# Patient Record
Sex: Female | Born: 1935 | Race: White | Hispanic: No | Smoking: Former smoker
Health system: Southern US, Community
[De-identification: ages and names within clinical notes are randomized; demographics above are authoritative.]

## PROBLEM LIST (undated history)

## (undated) DIAGNOSIS — K648 Other hemorrhoids: Secondary | ICD-10-CM

## (undated) DIAGNOSIS — F419 Anxiety disorder, unspecified: Secondary | ICD-10-CM

## (undated) DIAGNOSIS — D509 Iron deficiency anemia, unspecified: Secondary | ICD-10-CM

## (undated) DIAGNOSIS — I340 Nonrheumatic mitral (valve) insufficiency: Secondary | ICD-10-CM

## (undated) DIAGNOSIS — I509 Heart failure, unspecified: Secondary | ICD-10-CM

## (undated) DIAGNOSIS — C801 Malignant (primary) neoplasm, unspecified: Secondary | ICD-10-CM

## (undated) DIAGNOSIS — M8580 Other specified disorders of bone density and structure, unspecified site: Secondary | ICD-10-CM

## (undated) DIAGNOSIS — Z8669 Personal history of other diseases of the nervous system and sense organs: Secondary | ICD-10-CM

## (undated) DIAGNOSIS — H9190 Unspecified hearing loss, unspecified ear: Secondary | ICD-10-CM

## (undated) DIAGNOSIS — D649 Anemia, unspecified: Secondary | ICD-10-CM

## (undated) DIAGNOSIS — K219 Gastro-esophageal reflux disease without esophagitis: Secondary | ICD-10-CM

## (undated) DIAGNOSIS — I499 Cardiac arrhythmia, unspecified: Secondary | ICD-10-CM

## (undated) DIAGNOSIS — I10 Essential (primary) hypertension: Secondary | ICD-10-CM

## (undated) DIAGNOSIS — K5790 Diverticulosis of intestine, part unspecified, without perforation or abscess without bleeding: Secondary | ICD-10-CM

## (undated) DIAGNOSIS — E7800 Pure hypercholesterolemia, unspecified: Secondary | ICD-10-CM

## (undated) DIAGNOSIS — K5909 Other constipation: Secondary | ICD-10-CM

## (undated) DIAGNOSIS — C189 Malignant neoplasm of colon, unspecified: Secondary | ICD-10-CM

## (undated) DIAGNOSIS — Z974 Presence of external hearing-aid: Secondary | ICD-10-CM

## (undated) DIAGNOSIS — H9192 Unspecified hearing loss, left ear: Secondary | ICD-10-CM

## (undated) HISTORY — PX: FRACTURE SURGERY: SHX138

## (undated) HISTORY — DX: Heart failure, unspecified: I50.9

## (undated) HISTORY — DX: Malignant neoplasm of colon, unspecified: C18.9

## (undated) HISTORY — PX: EYE SURGERY: SHX253

## (undated) HISTORY — DX: Cardiac arrhythmia, unspecified: I49.9

## (undated) HISTORY — PX: FEMUR FRACTURE SURGERY: SHX633

## (undated) HISTORY — PX: OTHER SURGICAL HISTORY: SHX169

---

## 2004-02-29 ENCOUNTER — Ambulatory Visit: Payer: Self-pay

## 2004-10-23 ENCOUNTER — Ambulatory Visit: Payer: Self-pay | Admitting: Internal Medicine

## 2005-11-01 ENCOUNTER — Ambulatory Visit: Payer: Self-pay | Admitting: Internal Medicine

## 2006-11-04 ENCOUNTER — Ambulatory Visit: Payer: Self-pay | Admitting: Internal Medicine

## 2007-03-11 ENCOUNTER — Ambulatory Visit: Payer: Self-pay | Admitting: Internal Medicine

## 2007-11-06 ENCOUNTER — Ambulatory Visit: Payer: Self-pay | Admitting: Internal Medicine

## 2008-05-12 ENCOUNTER — Ambulatory Visit: Payer: Self-pay | Admitting: Internal Medicine

## 2008-11-11 ENCOUNTER — Ambulatory Visit: Payer: Self-pay | Admitting: Internal Medicine

## 2009-03-24 ENCOUNTER — Ambulatory Visit: Payer: Self-pay | Admitting: Gastroenterology

## 2009-11-22 ENCOUNTER — Ambulatory Visit: Payer: Self-pay | Admitting: Internal Medicine

## 2010-12-26 ENCOUNTER — Ambulatory Visit: Payer: Self-pay | Admitting: Family Medicine

## 2012-05-31 ENCOUNTER — Ambulatory Visit: Payer: Self-pay | Admitting: Orthopedic Surgery

## 2012-05-31 LAB — COMPREHENSIVE METABOLIC PANEL
Albumin: 4 g/dL (ref 3.4–5.0)
Alkaline Phosphatase: 52 U/L (ref 50–136)
Bilirubin,Total: 0.4 mg/dL (ref 0.2–1.0)
Calcium, Total: 9.5 mg/dL (ref 8.5–10.1)
Creatinine: 0.87 mg/dL (ref 0.60–1.30)
Glucose: 143 mg/dL — ABNORMAL HIGH (ref 65–99)
Potassium: 3.1 mmol/L — ABNORMAL LOW (ref 3.5–5.1)
SGOT(AST): 24 U/L (ref 15–37)
SGPT (ALT): 26 U/L (ref 12–78)
Sodium: 136 mmol/L (ref 136–145)

## 2012-05-31 LAB — URINALYSIS, COMPLETE
Bilirubin,UR: NEGATIVE
Blood: NEGATIVE
Leukocyte Esterase: NEGATIVE
Nitrite: NEGATIVE
Ph: 7 (ref 4.5–8.0)
Protein: NEGATIVE
Specific Gravity: 1.011 (ref 1.003–1.030)
Squamous Epithelial: NONE SEEN
WBC UR: 1 /HPF (ref 0–5)

## 2012-05-31 LAB — CBC WITH DIFFERENTIAL/PLATELET
Lymphocyte #: 2.8 10*3/uL (ref 1.0–3.6)
MCH: 31.1 pg (ref 26.0–34.0)
MCHC: 34.4 g/dL (ref 32.0–36.0)
MCV: 90 fL (ref 80–100)
Monocyte #: 1.4 x10 3/mm — ABNORMAL HIGH (ref 0.2–0.9)
Monocyte %: 13.3 %
Neutrophil #: 6.2 10*3/uL (ref 1.4–6.5)
Neutrophil %: 57.8 %
Platelet: 211 10*3/uL (ref 150–440)
RBC: 4.88 10*6/uL (ref 3.80–5.20)
RDW: 12.9 % (ref 11.5–14.5)

## 2012-06-01 ENCOUNTER — Inpatient Hospital Stay: Payer: Self-pay | Admitting: Specialist

## 2012-06-01 LAB — CBC WITH DIFFERENTIAL/PLATELET
Eosinophil #: 0 10*3/uL (ref 0.0–0.7)
HGB: 12.9 g/dL (ref 12.0–16.0)
Lymphocyte #: 0.9 10*3/uL — ABNORMAL LOW (ref 1.0–3.6)
Lymphocyte %: 5.5 %
MCH: 31.1 pg (ref 26.0–34.0)
MCHC: 34.1 g/dL (ref 32.0–36.0)
MCV: 91 fL (ref 80–100)
Monocyte #: 0.8 x10 3/mm (ref 0.2–0.9)
Monocyte %: 5.1 %
Neutrophil #: 14.3 10*3/uL — ABNORMAL HIGH (ref 1.4–6.5)
Neutrophil %: 89.1 %
RBC: 4.16 10*6/uL (ref 3.80–5.20)
RDW: 12.7 % (ref 11.5–14.5)
WBC: 16.1 10*3/uL — ABNORMAL HIGH (ref 3.6–11.0)

## 2012-06-01 LAB — BASIC METABOLIC PANEL
BUN: 16 mg/dL (ref 7–18)
Co2: 26 mmol/L (ref 21–32)
Creatinine: 0.68 mg/dL (ref 0.60–1.30)
Glucose: 170 mg/dL — ABNORMAL HIGH (ref 65–99)
Osmolality: 279 (ref 275–301)
Sodium: 137 mmol/L (ref 136–145)

## 2012-06-02 LAB — CBC WITH DIFFERENTIAL/PLATELET
Basophil #: 0 10*3/uL (ref 0.0–0.1)
Basophil %: 0.3 %
Eosinophil #: 0 10*3/uL (ref 0.0–0.7)
HCT: 30.4 % — ABNORMAL LOW (ref 35.0–47.0)
HGB: 10.4 g/dL — ABNORMAL LOW (ref 12.0–16.0)
Lymphocyte #: 1.8 10*3/uL (ref 1.0–3.6)
MCV: 92 fL (ref 80–100)
Monocyte #: 1.7 x10 3/mm — ABNORMAL HIGH (ref 0.2–0.9)
Neutrophil #: 8.4 10*3/uL — ABNORMAL HIGH (ref 1.4–6.5)
Neutrophil %: 70.5 %
Platelet: 145 10*3/uL — ABNORMAL LOW (ref 150–440)
WBC: 11.9 10*3/uL — ABNORMAL HIGH (ref 3.6–11.0)

## 2012-06-02 LAB — BASIC METABOLIC PANEL
BUN: 14 mg/dL (ref 7–18)
Calcium, Total: 7.7 mg/dL — ABNORMAL LOW (ref 8.5–10.1)
Chloride: 108 mmol/L — ABNORMAL HIGH (ref 98–107)
Creatinine: 0.56 mg/dL — ABNORMAL LOW (ref 0.60–1.30)
EGFR (African American): >60
EGFR (Non-African Amer.): >60
Osmolality: 277 (ref 275–301)
Potassium: 3.7 mmol/L (ref 3.5–5.1)

## 2012-06-04 ENCOUNTER — Encounter: Payer: Self-pay | Admitting: Internal Medicine

## 2012-06-04 LAB — CBC WITH DIFFERENTIAL/PLATELET
Basophil #: 0.1 10*3/uL (ref 0.0–0.1)
HGB: 10 g/dL — ABNORMAL LOW (ref 12.0–16.0)
Lymphocyte #: 2 10*3/uL (ref 1.0–3.6)
MCH: 31.3 pg (ref 26.0–34.0)
Monocyte #: 1.9 x10 3/mm — ABNORMAL HIGH (ref 0.2–0.9)
Neutrophil #: 7.3 10*3/uL — ABNORMAL HIGH (ref 1.4–6.5)
Neutrophil %: 63 %
Platelet: 170 10*3/uL (ref 150–440)
RBC: 3.19 10*6/uL — ABNORMAL LOW (ref 3.80–5.20)
RDW: 12.4 % (ref 11.5–14.5)

## 2012-06-04 LAB — BASIC METABOLIC PANEL
BUN: 14 mg/dL (ref 7–18)
Calcium, Total: 8.2 mg/dL — ABNORMAL LOW (ref 8.5–10.1)
Chloride: 103 mmol/L (ref 98–107)
Co2: 28 mmol/L (ref 21–32)
EGFR (African American): >60
EGFR (Non-African Amer.): >60
Osmolality: 275 (ref 275–301)
Sodium: 137 mmol/L (ref 136–145)

## 2012-06-29 ENCOUNTER — Encounter: Payer: Self-pay | Admitting: Internal Medicine

## 2012-07-08 LAB — BASIC METABOLIC PANEL
Anion Gap: 4 — ABNORMAL LOW (ref 7–16)
Co2: 31 mmol/L (ref 21–32)
Creatinine: 0.73 mg/dL (ref 0.60–1.30)
EGFR (Non-African Amer.): >60
Glucose: 74 mg/dL (ref 65–99)
Osmolality: 274 (ref 275–301)
Potassium: 4.1 mmol/L (ref 3.5–5.1)

## 2013-07-27 DIAGNOSIS — K5909 Other constipation: Secondary | ICD-10-CM | POA: Insufficient documentation

## 2013-07-27 DIAGNOSIS — M8580 Other specified disorders of bone density and structure, unspecified site: Secondary | ICD-10-CM | POA: Insufficient documentation

## 2014-05-21 NOTE — Discharge Summary (Signed)
PATIENT NAME:  [name] [name], [name] [name] MR#:  [id number] DATE OF BIRTH:  09-Mar-1929  DATE OF ADMISSION:  06/01/2012 DATE OF DISCHARGE:  06/04/2012   FINAL DIAGNOSES:  1.  Displaced intertrochanteric fracture of right hip.  2.  Hypertension.  3.  Hyperlipidemia.   OPERATION: On 06/01/2012, ORIF of right hip fracture with a long gamma nail by Dr. Durene Cal.   COMPLICATIONS: None.   CONSULTATIONS [facility].   DISCHARGE AND HOME MEDICATIONS: Verapamil 180 mg daily, Retin-A as needed, lovastatin 40 mg daily, hydrochlorothiazide 25 mg daily, enteric-coated aspirin 1 p.o. b.i.d., calcium with vitamin D twice a day, Norco 5/325 p.r.n. pain.   HISTORY OF PRESENT ILLNESS: The patient is an 81 year old female who was walking up stairs and missed a step and fell, injuring her right hip. She was brought to the Emergency Room at [facility] where exam and x-rays revealed a displaced intertrochanteric fracture of the right hip. She was admitted for surgery and medical evaluation. She was admitted to Durene Cal.   PAST MEDICAL HISTORY:  ILLNESSES: As above.   ALLERGIES: SULFA AND CIPRO.   HOME MEDICATIONS: As above.   SOCIAL HISTORY: Unremarkable. The patient is married and lives with her husband.   FAMILY HISTORY: Mother died from colon cancer. Father had ASHD.   REVIEW OF SYSTEMS: Unremarkable.   PHYSICAL EXAMINATION: The patient was alert and cooperative. The right leg was painful with movement and was shortened and rotated. Neurovascular status was good distally. The skin was intact. No other orthopedic injuries were noted.   LABORATORY DATA: On admission was satisfactory except for a potassium of 3.1. She received replacement for this.   HOSPITAL COURSE: The patient was cleared for surgery and underwent long gamma nail rodding of the right hip on 06/01/2012. Postoperatively, she did quite well. Hemoglobin remained stable and she started physical therapy without problems. The patient is stable and  ready for skilled nursing discharge on 06/04/2012. She is partial weight-bearing on a walker with good rehabilitation potential. She will return to my office in 2 weeks for exam and x-rays of the hip.   ____________________________ Park Breed, MD hem:jm D: 06/04/2012 13:59:41 ET T: 06/04/2012 14:21:14 ET JOB#: [id number]  cc: Park Breed, MD, <Dictator> [name], MD Park Breed MD ELECTRONICALLY SIGNED 06/11/2012 10:00

## 2014-05-21 NOTE — Consult Note (Signed)
PATIENT NAME:  [name] [name], FULTS MR#:  [id number] DATE OF BIRTH:  11/13/1929  DATE OF CONSULTATION:  06/01/2012  REFERRING PHYSICIAN:  Dr. Durene Cal CONSULTING PHYSICIAN:  Monica Becton, MD  PRIMARY CARE PHYSICIAN:  Dr. Netty Starring.  REASON FOR CONSULTATION:  Preoperative clearance and medical management.  HISTORY OF PRESENT ILLNESS:  The patient is an 81 year old pleasant female with a past medical history of hypertension, hyperlipidemia, was walking up the stairs and missed a step and fell down and started to experience pain in the right hip.  The patient could not stand up.  Concerning this, EMS was called and was brought to the Emergency Department.  Work-up in the Emergency Department showed the patient sustained right intratrochanteric fracture.  The patient has been admitted under orthopedic surgery.  Lab work-up unremarkable except for the potassium of 3.1.  The patient at baseline is well-functional, walks about 1 mile 3 to 4 times a week.  Independent of ADLs and IADLs.  No previous episodes of chest pain.  No prior cardiac work-up for the heart.  Denies having any chest pain, palpitations.  No shortness of breath with exertion.   PAST MEDICAL HISTORY: 1.  Hypertension.  2.  Hyperlipidemia.   ALLERGIES:  SULFA DRUGS AND CIPRO.   HOME MEDICATIONS: 1.  Verapamil 180 mg once a day.  2.  Retin A as needed. 3.  Lovastatin 40 mg once a day.  4.  Hydrochlorothiazide 25 mg once a day.  5.  Aspirin 81 mg once a day.   SOCIAL HISTORY:  Smoked in the past, quit in 1986, prior to that smoked 1 pack a day.  Denies drinking alcohol or using any illicit drugs.  Married, lives with her husband.   FAMILY HISTORY:  Mother died from colon cancer.  Father had atherosclerotic disease.   REVIEW OF SYSTEMS: CONSTITUTIONAL:  No generalized weakness, fatigue.  No weight loss.  EYES:  No change in vision, wears reading glasses.  EARS:  No change in hearing or tinnitus.  RESPIRATORY:  No  cough, shortness of breath.  CARDIOVASCULAR:  No chest pain, palpitations, lower extremity swelling.  GASTROINTESTINAL:  Has good appetite, usually is constipated, takes MiraLAX with good improvement.  SKIN:  No rash or lesions.  MSK osteoarthritis.  No joint pains or aches.  ENDOCRINE:  No polyuria or polydipsia.  NEUROLOGIC:  No weakness.  PSYCHIATRIC:  No depression.   PHYSICAL EXAMINATION: GENERAL:  This is well-built, well-nourished, age-appropriate female lying down in the bed, not in distress.  VITAL SIGNS:  Temperature 99, pulse 78, blood pressure 140/64, respiratory rate of 18, oxygen saturation is 97% on room air.  HEENT:  Head normocephalic, atraumatic.  Eyes, no scleral icterus.  Conjunctivae normal.  Pupils equal and react to light.  Extraocular movements are intact.  Mucous membranes moist.  No pharyngeal erythema.  NECK:  Supple.  No lymphadenopathy.  No JVD.  No carotid bruit.  CHEST:  Has no focal tenderness.  LUNGS:  Bilaterally clear to auscultation.  HEART:  S1, S2 regular.  No murmurs are heard.  No pedal edema.  Pulses 2+ in dorsalis pedis.  ABDOMEN:  Bowel sounds plus.  Soft, nontender, nondistended.  No hepatosplenomegaly.  EXTREMITIES:  Right lower extremity externally rotated.  NEUROLOGIC:  The patient is alert, oriented to place, person and time.  Cranial nerves II through XII intact.  I did not check the motor secondary to right hip fracture.  SKIN:  No rash or lesions.   LABORATORY DATA:  CBC, WBC of 10.7, hemoglobin 15.2, platelet count of 211.  Complete metabolic panel is completely within normal limits.  UA negative for nitrites and leukocyte esterase.  X-ray of the hip, comminuted, angulated, displaced, intratrochanteric fracture of the right hip.  X-ray of the pelvis, no pelvic fractures.  Chest x-ray, 1 view, portable, mild hyperinflation which may reflect COPD, an element of pulmonary fibrosis likely present.   EKG 12-lead:  Normal sinus rhythm with no ST-T  wave abnormalities.   ASSESSMENT AND PLAN:  This patient is an 81 year old pleasant white female who had a mechanical fall and sustained a right intratrochanteric hip fracture.  1.  Right intratrochanteric hip fracture.  As per revised cardiac index score patient has 0.4% of perioperative complications.  Keep the patient on incentive spirometer.  Early ambulation after the surgery.  Discontinue the Foley catheter at the earliest.  Keep the patient on deep vein thrombosis prophylaxis per orthopedic surgeon's preference.  2.  Hypertension.  We will hold the hydrochlorothiazide.  Continue with the verapamil.  3.  Hypokalemia.  We will replace by mouth.  4.  Keep the patient on deep vein thrombosis prophylaxis with Lovenox.    Thank you for the consult.  TIME SPENT:  45 minutes.     ____________________________ Monica Becton, MD pv:ea D: 06/01/2012 02:56:27 ET T: 06/01/2012 04:36:20 ET JOB#: [id number]  cc:   Monica Becton, MD, <Dictator> Maebelle Munroe, MD Monica Becton MD ELECTRONICALLY SIGNED 06/02/2012 7:11

## 2014-05-21 NOTE — Consult Note (Signed)
Brief Consult Note: Diagnosis: displaced closed R intertroch fx.   Consult note dictated.   Recommend to proceed with surgery or procedure.   Orders entered.   Discussed with Attending MD.   Comments: plan: CR vs OR, cephomed nail risks/benefits/alternatives d/w pt / husband traction int med eval  full note to follow.  Electronic Signatures: Maebelle Munroe (MD)  (Signed [phone] 23:29)  Authored: Brief Consult Note   Last Updated: 03-May-14 23:29 by Maebelle Munroe (MD)

## 2014-05-21 NOTE — H&P (Signed)
PATIENT NAME:  [name] [name], [name] [name] MR#:  [id number] DATE OF BIRTH:  [birth year]  DATE OF ADMISSION:  06/01/2012  CHIEF COMPLAINT: Right hip pain.   HISTORY OF PRESENT ILLNESS: The patient is an 81 year old female, who sustained a fall today with resultant displaced right hip intertrochanteric fracture for which I was asked to provide definitive consultation. She complains of sharp, intermittent pain over the right lateral hip region. She had no antecedent right hip pain. She has been quite active with her husband and walking and, in fact, walked a mile today. She does have a history of osteoporosis. She is currently on Evista for that.   REVIEW OF SYSTEMS:   NEUROLOGIC: No loss of consciousness.  CARDIAC: No chest pain.  RESPIRATORY: No shortness of breath.  SKIN: No laceration.  GASTROINTESTINAL: No nausea or vomiting.  GENITOURINARY: No frequency or dysuria.  MUSCULOSKELETAL: Complains of right hip pain only.  all other review of systems  PAST MEDICAL HISTORY: Remarkable for hypercholesterolemia, hypertension and basal cell carcinoma of the face and leg.   PAST SURGICAL HISTORY: Includes resection of basal cell carcinoma.   CURRENT MEDICATIONS: Include verapamil, Retin-A, lovastatin, hydrochlorothiazide and  aspirin.   ALLERGIES: SULFA AND CIPRO.   SOCIAL HISTORY: Quit smoking long ago. No alcohol use. No illicit drug use. Currently married and lives with her husband.   FAMILY HISTORY: Positive for colon cancer and atherosclerotic disease.   PHYSICAL EXAMINATION:  GENERAL: Pleasant, alert, elderly female appearing of her stated age.  VITAL SIGNS: On admission temperature of 98.3, pulse 73, respirations of 19, blood pressure of 143/70 and pulse oximetry 94% on room air.  HEENT: Normocephalic, atraumatic. Sclerae clear. Oral mucosal membranes are slightly dry.  SKIN: Warm and intact over the right hip.  LUNGS: Clear to auscultation bilaterally.  HEART: Regular rate and rhythm.  Normal S1, S2. No murmurs or gallops.  ABDOMEN: Soft, nontender, nondistended. Positive bowel sounds.  LYMPHATIC: Mild swelling of the right hip region.  NEUROLOGIC: Motor and light touch sensation examination intact in the right lower extremity.  VASCULAR: 2+ dorsalis pedis and posterior tibialis pulse.  MUSCULOSKELETAL: Bilateral upper extremity examination and left lower extremity examination without deformity or tenderness to palpation. Right lower extremity evaluation shows a shortened and externally rotated right lower extremity with focal tenderness to palpation over the lateral aspect of the right hip. The remainder of the ipsilateral right lower extremity is nontender to palpation and without deformity.   Radiographs reveal a comminuted, displaced, 4-part, right hip intertrochanteric fracture.   LABORATORY EVALUATION: On admission to the hospital, chemistries unremarkable except for slightly increased glucose. Hemoglobin and hematocrit excellent at 15.2 and 44.1 with 211,000 platelets. Urinalysis is unremarkable.   IMPRESSION: Closed displaced right hip intertrochanteric fracture.   PLAN: I have recommended a closed versus open reduction and right hip long cephalomedullary nailing. Risks, benefits and alternatives were discussed with the patient and her husband to include, but not limited to bleeding, infection, damage to blood vessels and nerves, need for further surgery and treatment, chronic pain, loss of function, stiffness, allergy, anesthetic risk, deep vein thrombosis, PE; heart, lung, brain, kidney complications, leg length inequality, malunion, nonunion, need for hardware removal, DVT and PE.  She appeared to understand the risks, benefits and desired to proceed with operative treatment. Hospitalist consultation has deemed her to be medically optimized for surgical intervention tomorrow on 06/01/2012.   ____________________________ Maebelle Munroe, MD jfs:aw D: 06/01/2012 13:05:15  ET T: 06/01/2012 13:21:29 ET JOB#: [id number]  cc:  Maebelle Munroe, MD, <Dictator> Maebelle Munroe MD ELECTRONICALLY SIGNED 06/01/2012 22:49

## 2014-05-21 NOTE — Op Note (Signed)
PATIENT NAME:  [name] [name], [name] [name] MR#:  [id number] DATE OF BIRTH:  1929/10/08  DATE OF PROCEDURE:  06/01/2012  PREOPERATIVE DIAGNOSIS: Closed displaced 4-part right hip intertrochanteric fracture.   POSTOPERATIVE DIAGNOSIS: Closed displaced 4-part right hip intertrochanteric fracture.   PROCEDURE PERFORMED:  Closed reduction with long cephalomedullary nail placement, right hip intertrochanteric fracture.  SURGEON: Durene Cal, MD  ASSISTANT:  None.  COMPLICATIONS:  None apparent.  ANESTHESIA: Spinal with IV sedation.   OPERATIVE FINDINGS: Comminuted although reducible right hip 4-part intertrochanteric fracture.  Leg lengths equal and 2+ dorsalis pedis and posterior tibialis pulse. Equal with appropriate rotation of the right lower extremity post-procedure.   IMPLANTS: Stryker gamma 3 nail, size 11 x 130 degree x 360 mm, 10.5 x 105 mm lag screw, 5.0 mm interlocking screws x 2, set screw x 1.   INDICATIONS: Ms. Dohn is an 81 year old female who was seen fall to yesterday with resultant displaced intertrochanteric fracture requiring operative management. Risks, benefits and alternatives were discussed with her and her husband to include but not limited to bleeding, infection, damage to blood vessels and nerves with need for further surgery and treatment, chronic pain, loss of function, stiffness, allergy, anesthetic risk, DVT, PE, heart, lung, brain kidney complications, malunion, nonunion, need for hardware removal, and leg length inequality.  She appeared to understand the risks and benefits and desired to proceed with operative treatment. Internal medicine consultation was obtained preoperatively, which deemed her to be medically optimized for surgical intervention.   ESTIMATED BLOOD LOSS: 200 mL.   DESCRIPTION OF PROCEDURE: After positive identification of the patient in the preoperative holding area, after informed consent had been obtained, the correct operative site had been  initialed by myself, the patient was taken to the operating room and placed in the supine position. IV antibiotics were given before any skin incision had been made. A timeout was performed. Spinal anesthesia had been administered in the preoperative holding area.  The patient was brought down against the perineal post. Traction and internal rotation were applied to the right lower extremity. The left lower extremity was flexed into well-leg holder. All bony prominences were padded. A bump was placed under the right hip.   Fluoroscopic unit was brought in confirming a near anatomic reduction.  The right hip and thigh were prepped and draped in the usual sterile fashion.  TED sequentials were applied to the opposite leg. A longitudinal incision was made coursing in line with the femur, proximal to the greater trochanter. Incision was made sharply through skin and subcutaneous tissues. The tensor fascia lata was incised in line with the skin incision.  Blunt dissection was carried out down to the level of the greater trochanter.  The curved awl was placed into an appropriate position under AP and lateral imaging. Guidewire was advanced along with a 1-step reamer. The curved ball-tip guidewire was confirmed in intramedullary position under biplanar fluoroscopy and was advanced to a center-center position in the distal femur confirmed under true lateral fluoroscopy of the knee. Guidewire depth was assessed at 390 mm and I opted to place a 360 mm nail given the 2 cm been in the guidewire. Successive reaming was carried out from 11 to 13 mm in 1 mm increments. The 11 x 130 degree, which was templated preoperatively with fluoroscopy, x 360 mm nail, was then placed to an appropriate depth using a carbon fiber aiming guide. The guidewire sleeve was then brought to the lateral aspect of the femur after a stab incision  had been made over the lateral aspect of the thigh. The guidewire was placed into a slightly low and  posterior position. It was assessed at 101 mm and then hence requiring a 105 mm lag screw.  The guidewire was overdrilled and the lag screw was placed to an appropriate depth under AP and lateral fluoroscopy.  The fracture was compressed, the set screw was then placed, and final fluoroscopic imaging revealed there to be a near anatomic reduction with acceptable position of the hardware and reduction of the fracture. Next, attention was directed distally where a perfect circle technique was performed and two 5.0 mm interlocking screws were placed using standard AO technique.  Intramedullary placement of the guide screw was confirmed under biplanar fluoroscopy as well as appropriate length of the screws.  All wounds were copiously irrigated.  The proximal wound was closed with 0 PDS in a running fashion, in the deep fascia, as well as 0 PDS and 3-0 PDS and 3-0 nylon, and 3-0 nylon in the distal 3 incisions as well. 30 mL of 0.25% plain bupivacaine was injected proximal to the nail insertion site as well as into the deep soft tissues.  The patient was then transferred to the recovery room bed where equal leg lengths, appropriate rotation, and 2+ dorsalis pedis pulse was noted of the right lower extremity. The patient tolerated the procedure well and was taken to the recovery room in satisfactory condition without apparent operative site complications. These findings were relayed to her husband as well as her need for follow up and compliance and in-hospital stay.  ____________________________ Maebelle Munroe, MD jfs:sb D: 06/01/2012 22:28:52 ET T: 06/02/2012 07:24:34 ET JOB#: [id number]  cc: Maebelle Munroe, MD, <Dictator> Maebelle Munroe MD ELECTRONICALLY SIGNED 06/23/2012 19:28

## 2014-08-09 ENCOUNTER — Encounter: Payer: Self-pay | Admitting: *Deleted

## 2014-08-10 ENCOUNTER — Ambulatory Visit
Admission: RE | Admit: 2014-08-10 | Discharge: 2014-08-10 | Disposition: A | Discharge status: Home / Self Care | Payer: Medicare Other | Source: Ambulatory Visit | Attending: Gastroenterology | Admitting: Gastroenterology

## 2014-08-10 ENCOUNTER — Ambulatory Visit: Payer: Medicare Other | Admitting: Anesthesiology

## 2014-08-10 ENCOUNTER — Encounter: Payer: Self-pay | Admitting: Anesthesiology

## 2014-08-10 ENCOUNTER — Encounter
Admission: RE | Disposition: A | Discharge status: Home / Self Care | Payer: Self-pay | Source: Ambulatory Visit | Attending: Gastroenterology

## 2014-08-10 DIAGNOSIS — E780 Pure hypercholesterolemia: Secondary | ICD-10-CM | POA: Insufficient documentation

## 2014-08-10 DIAGNOSIS — K921 Melena: Secondary | ICD-10-CM | POA: Diagnosis not present

## 2014-08-10 DIAGNOSIS — D123 Benign neoplasm of transverse colon: Secondary | ICD-10-CM | POA: Insufficient documentation

## 2014-08-10 DIAGNOSIS — F419 Anxiety disorder, unspecified: Secondary | ICD-10-CM | POA: Insufficient documentation

## 2014-08-10 DIAGNOSIS — K5730 Diverticulosis of large intestine without perforation or abscess without bleeding: Secondary | ICD-10-CM | POA: Insufficient documentation

## 2014-08-10 DIAGNOSIS — Z87891 Personal history of nicotine dependence: Secondary | ICD-10-CM | POA: Insufficient documentation

## 2014-08-10 DIAGNOSIS — Z79899 Other long term (current) drug therapy: Secondary | ICD-10-CM | POA: Diagnosis not present

## 2014-08-10 DIAGNOSIS — Z800 Family history of malignant neoplasm of digestive organs: Secondary | ICD-10-CM | POA: Diagnosis not present

## 2014-08-10 DIAGNOSIS — I252 Old myocardial infarction: Secondary | ICD-10-CM | POA: Diagnosis not present

## 2014-08-10 DIAGNOSIS — K648 Other hemorrhoids: Secondary | ICD-10-CM | POA: Diagnosis not present

## 2014-08-10 DIAGNOSIS — I10 Essential (primary) hypertension: Secondary | ICD-10-CM | POA: Insufficient documentation

## 2014-08-10 DIAGNOSIS — D120 Benign neoplasm of cecum: Secondary | ICD-10-CM | POA: Insufficient documentation

## 2014-08-10 HISTORY — DX: Malignant (primary) neoplasm, unspecified: C80.1

## 2014-08-10 HISTORY — DX: Essential (primary) hypertension: I10

## 2014-08-10 HISTORY — DX: Anxiety disorder, unspecified: F41.9

## 2014-08-10 HISTORY — DX: Other hemorrhoids: K64.8

## 2014-08-10 HISTORY — DX: Other constipation: K59.09

## 2014-08-10 HISTORY — DX: Unspecified hearing loss, unspecified ear: H91.90

## 2014-08-10 HISTORY — DX: Pure hypercholesterolemia, unspecified: E78.00

## 2014-08-10 HISTORY — PX: COLONOSCOPY WITH PROPOFOL: SHX5780

## 2014-08-10 HISTORY — DX: Other specified disorders of bone density and structure, unspecified site: M85.80

## 2014-08-10 HISTORY — DX: Diverticulosis of intestine, part unspecified, without perforation or abscess without bleeding: K57.90

## 2014-08-10 SURGERY — COLONOSCOPY WITH PROPOFOL
Anesthesia: General

## 2014-08-10 MED ORDER — SODIUM CHLORIDE 0.9 % IV SOLN: INTRAVENOUS | Status: DC

## 2014-08-10 MED ORDER — LIDOCAINE HCL (CARDIAC) 20 MG/ML IV SOLN
INTRAVENOUS | Status: DC | PRN
  Administered 2014-08-10: 80 mg via INTRAVENOUS

## 2014-08-10 MED ORDER — SODIUM CHLORIDE 0.9 % IV SOLN
INTRAVENOUS | Status: DC
  Administered 2014-08-10: 1000 mL via INTRAVENOUS

## 2014-08-10 MED ORDER — PROPOFOL 10 MG/ML IV BOLUS
INTRAVENOUS | Status: DC | PRN
  Administered 2014-08-10: 40 mg via INTRAVENOUS

## 2014-08-10 MED ORDER — PROPOFOL INFUSION 10 MG/ML OPTIME
INTRAVENOUS | Status: DC | PRN
  Administered 2014-08-10: 100 ug/kg/min via INTRAVENOUS

## 2014-08-10 NOTE — Op Note (Signed)
[facility] Gastroenterology Patient Name: [name] [name] Procedure Date: 08/10/2014 2:34 PM MRN: [id number] Account #: [id number] Date of Birth: 1930-01-02 Admit Type: Outpatient Age: 81 Room: [facility] ENDO ROOM 3 Gender: Female Note Status: Finalized Procedure:         Colonoscopy Indications:       Hematochezia, Family history of colon cancer in a                     first-degree relative Providers:         Lollie Sails, MD Referring MD:      [name] (Referring MD) Medicines:         Monitored Anesthesia Care Complications:     No immediate complications. Procedure:         Pre-Anesthesia Assessment:                    - ASA Grade Assessment: III - A patient with severe                     systemic disease.                    After obtaining informed consent, the colonoscope was                     passed under direct vision. Throughout the procedure, the                     patient's blood pressure, pulse, and oxygen saturations                     were monitored continuously. The Olympus PCF-H180AL                     colonoscope ( S#: [id number] ) was introduced through the                     anus and advanced to the the cecum, identified by                     appendiceal orifice and ileocecal valve. The colonoscopy                     was unusually difficult due to a tortuous colon.                     Successful completion of the procedure was aided by                     changing the patient to a supine position, changing the                     patient to a prone position and using manual pressure. The                     patient tolerated the procedure well. The quality of the                     bowel preparation was good. Findings:      Multiple small and large-mouthed diverticula were found in the sigmoid       colon, in the descending colon and in the transverse colon.      A 3 mm polyp was found at the hepatic flexure. The polyp  was  sessile.       The polyp was removed with a cold snare. Resection and retrieval were       complete.      Two sessile polyps were found in the cecum. The polyps were 1 to 2 mm in       size. These polyps were removed with a cold biopsy forceps. Resection       and retrieval were complete.      A 5 mm polyp was found at the hepatic flexure. The polyp was flat. The       polyp was removed with a cold snare. Resection and retrieval were       complete.      Non-bleeding internal hemorrhoids were found during retroflexion. The       hemorrhoids were medium-sized. Impression:        - Diverticulosis in the sigmoid colon, in the descending                     colon and in the transverse colon.                    - One 3 mm polyp at the hepatic flexure. Resected and                     retrieved.                    - Two 1 to 2 mm polyps in the cecum. Resected and                     retrieved.                    - One 5 mm polyp at the hepatic flexure. Resected and                     retrieved.                    - Non-bleeding internal hemorrhoids. Recommendation:    - Await pathology results.                    - Telephone GI clinic for pathology results in 1 week.                    - Refer to a colo-rectal surgeon, Dr Rochel Brome at                     appointment to be scheduled. Procedure Code(s): --- Professional ---                    [id number], Colonoscopy, flexible; with removal of tumor(s),                     polyp(s), or other lesion(s) by snare technique                    45380, 82, Colonoscopy, flexible; with biopsy, single or                     multiple Diagnosis Code(s): --- Professional ---                    211.3, Benign neoplasm of colon  455.0, Internal hemorrhoids without mention of complication                    578.1, Blood in stool                    V16.0, Family history of malignant neoplasm of                     gastrointestinal tract                     562.10, Diverticulosis of colon (without mention of                     hemorrhage) CPT copyright 2014 [employer]. All rights reserved. The codes documented in this report are preliminary and upon coder review may  be revised to meet current compliance requirements. Lollie Sails, MD 08/10/2014 3:16:50 PM This report has been signed electronically. Number of Addenda: 0 Note Initiated On: 08/10/2014 2:34 PM Scope Withdrawal Time: 0 hours 9 minutes 13 seconds  Total Procedure Duration: 0 hours 28 minutes 52 seconds       [facility]

## 2014-08-10 NOTE — H&P (Signed)
Outpatient short stay form Pre-procedure 08/10/2014 2:31 PM [name] Sails MD  Primary Physician: Dr Viviana Simpler  Reason for visit:  Colonoscopy  History of present illness:  Asian is an 81 year old female for a colonoscopy. She has a family history of colon cancer in her mother. He has been doing well since her last colonoscopy on 03/03/2009 however she has been experiencing some intermittent rectal pain and rectal bleeding. She does have a history of hemorrhoids. She tolerated her prep well. She does not take any anticoagulates medications or aspirin products.    Current facility-administered medications:  .  0.9 %  sodium chloride infusion, , Intravenous, Continuous, [name] Sails, MD, Last Rate: 50 mL/hr at 08/10/14 1358, 1,000 mL at 08/10/14 1358 .  0.9 %  sodium chloride infusion, , Intravenous, Continuous, [name] Sails, MD  Prescriptions prior to admission  Medication Sig Dispense Refill Last Dose  . ascorbic acid (VITAMIN C) 1000 MG tablet Take 1,000 mg by mouth daily.   08/09/2014 at Unknown time  . b complex vitamins capsule Take 1 capsule by mouth daily.   08/09/2014 at Unknown time  . beta carotene 25000 UNIT capsule Take 25,000 Units by mouth daily.   08/09/2014 at 800am  . Biotin 5 MG CAPS Take 5 mg by mouth.   08/09/2014 at 800am  . calcium carbonate 1250 MG capsule Take 1,250 mg by mouth daily.   9/37/9024 at 097DZ  . folic acid (FOLVITE) 329 MCG tablet Take 800 mcg by mouth daily.   10/22/2681 at 419QQ  . Garlic 2297 MG CAPS Take 810 mg by mouth daily.   08/09/2014 at 800am  . hydrochlorothiazide (HYDRODIURIL) 25 MG tablet Take 25 mg by mouth daily.   08/10/2014 at 800am  . lovastatin (MEVACOR) 40 MG tablet Take 40 mg by mouth at bedtime.   08/09/2014 at 800pim  . tretinoin (RETIN-A) 0.05 % cream Apply 1 Dose topically at bedtime.   08/09/2014 at 800pm time  . verapamil (CALAN-SR) 180 MG CR tablet Take 180 mg by mouth at bedtime.   08/10/2014 at 800am time  .  vitamin E 400 UNIT capsule Take 400 Units by mouth daily.     . Multiple Vitamin (MULTIVITAMIN) tablet Take 1 tablet by mouth daily.   08/09/2014 at 800am  . Multiple Vitamins-Minerals (HAIR/SKIN/NAILS PO) Take 1 tablet by mouth daily.   08/10/2014 at 800am  . selenium 50 MCG TABS tablet Take 100 mcg by mouth daily.   08/09/2014 at 800am     Allergies  Allergen Reactions  . Bactrim [Sulfamethoxazole-Trimethoprim]   . Ciprofloxacin      Past Medical History  Diagnosis Date  . Hypertension   . Pure hypercholesterolemia   . Constipation, chronic   . Osteopenia   . Anxiety   . Diverticular disease   . Hearing deficit   . Cancer     Squamous Cell Carcinoma (top of head)  . Internal hemorrhoids     Review of systems:      Physical Exam    Heart and lungs: Regular rate and rhythm without rub or gallop lungs are bilaterally clear    HEENT: Normocephalic atraumatic eyes are anicteric    Other:     Pertinant exam for procedure: Soft nontender nondistended bowel sounds positive normoactive    Planned proceedures: Colonoscopy and indicated procedures I have discussed the risks benefits and complications of procedures to include not limited to bleeding, infection, perforation and the risk of sedation and the patient wishes  to proceed. I have discussed the risks benefits and complications of procedures to include not limited to bleeding, infection, perforation and the risk of sedation and the patient wishes to proceed.    [name] Sails, MD Gastroenterology 08/10/2014  2:31 PM

## 2014-08-10 NOTE — Anesthesia Preprocedure Evaluation (Addendum)
Anesthesia Evaluation  Patient identified by MRN, date of birth, ID band Patient awake    Reviewed: Allergy & Precautions, H&P , NPO status , Patient's Chart, lab work & pertinent test results, reviewed documented beta blocker date and time   Airway Mallampati: III  TM Distance: >3 FB Neck ROM: full    Dental no notable dental hx. (+) Teeth Intact   Pulmonary Recent URI , Residual Cough, former smoker,  breath sounds clear to auscultation  Pulmonary exam normal       Cardiovascular Exercise Tolerance: Good hypertension, - Past MI Normal cardiovascular examRhythm:regular Rate:Normal     Neuro/Psych PSYCHIATRIC DISORDERS Anxiety negative neurological ROS     GI/Hepatic negative GI ROS, Neg liver ROS,   Endo/Other  negative endocrine ROS  Renal/GU negative Renal ROS  negative genitourinary   Musculoskeletal   Abdominal   Peds  Hematology negative hematology ROS (+)   Anesthesia Other Findings Past Medical History:   Hypertension                                                 Pure hypercholesterolemia                                    Constipation, chronic                                        Osteopenia                                                   Anxiety                                                      Diverticular disease          Resolving URI with non productive cough at night, afebrile at this time.                                 Hearing deficit                                              Cancer                                                         Comment:Squamous Cell Carcinoma (top of head)   Internal hemorrhoids  Reproductive/Obstetrics negative OB ROS                            Anesthesia Physical Anesthesia Plan  ASA: III  Anesthesia Plan: General   Post-op Pain Management:    Induction:   Airway Management Planned:    Additional Equipment:   Intra-op Plan:   Post-operative Plan:   Informed Consent: I have reviewed the patients History and Physical, chart, labs and discussed the procedure including the risks, benefits and alternatives for the proposed anesthesia with the patient or authorized representative who has indicated his/her understanding and acceptance.   Dental Advisory Given  Plan Discussed with: Anesthesiologist, CRNA and Surgeon  Anesthesia Plan Comments:         Anesthesia Quick Evaluation

## 2014-08-10 NOTE — Transfer of Care (Signed)
Immediate Anesthesia Transfer of Care Note  Patient: [name] [name]  Procedure(s) Performed: Procedure(s): COLONOSCOPY WITH PROPOFOL (N/A)  Patient Location: PACU  Anesthesia Type:General  Level of Consciousness: awake, alert , oriented and patient cooperative  Airway & Oxygen Therapy: Patient Spontanous Breathing and Patient connected to nasal cannula oxygen  Post-op Assessment: Report given to RN and Post -op Vital signs reviewed and stable  Post vital signs: Reviewed and stable  Last Vitals:  Filed Vitals:   08/10/14 1521  BP: 122/54  Pulse:   Temp: 36.5 C  Resp: 16    Complications: No apparent anesthesia complications

## 2014-08-10 NOTE — Anesthesia Postprocedure Evaluation (Signed)
  Anesthesia Post-op Note  Patient: [name] [name]  Procedure(s) Performed: Procedure(s): COLONOSCOPY WITH PROPOFOL (N/A)  Anesthesia type:General  Patient location: PACU  Post pain: Pain level controlled  Post assessment: Post-op Vital signs reviewed, Patient's Cardiovascular Status Stable, Respiratory Function Stable, Patent Airway and No signs of Nausea or vomiting  Post vital signs: Reviewed and stable  Last Vitals:  Filed Vitals:   08/10/14 1521  BP: 122/54  Pulse:   Temp: 36.5 C  Resp: 16    Level of consciousness: awake, alert  and patient cooperative  Complications: No apparent anesthesia complications

## 2014-08-11 ENCOUNTER — Encounter: Payer: Self-pay | Admitting: Gastroenterology

## 2014-08-13 LAB — SURGICAL PATHOLOGY

## 2015-08-14 ENCOUNTER — Emergency Department: Payer: Medicare Other

## 2015-08-14 ENCOUNTER — Emergency Department
Admission: EM | Admit: 2015-08-14 | Discharge: 2015-08-14 | Disposition: A | Discharge status: Home / Self Care | Payer: Medicare Other | Attending: Emergency Medicine | Admitting: Emergency Medicine

## 2015-08-14 DIAGNOSIS — I10 Essential (primary) hypertension: Secondary | ICD-10-CM | POA: Diagnosis not present

## 2015-08-14 DIAGNOSIS — Z85828 Personal history of other malignant neoplasm of skin: Secondary | ICD-10-CM | POA: Diagnosis not present

## 2015-08-14 DIAGNOSIS — Z79899 Other long term (current) drug therapy: Secondary | ICD-10-CM | POA: Diagnosis not present

## 2015-08-14 DIAGNOSIS — Z87891 Personal history of nicotine dependence: Secondary | ICD-10-CM | POA: Insufficient documentation

## 2015-08-14 DIAGNOSIS — R1032 Left lower quadrant pain: Secondary | ICD-10-CM

## 2015-08-14 DIAGNOSIS — N390 Urinary tract infection, site not specified: Secondary | ICD-10-CM | POA: Diagnosis not present

## 2015-08-14 LAB — BASIC METABOLIC PANEL
ANION GAP: 10 (ref 5–15)
BUN: 22 mg/dL — ABNORMAL HIGH (ref 6–20)
CO2: 29 mmol/L (ref 22–32)
CREATININE: 0.61 mg/dL (ref 0.44–1.00)
Calcium: 10 mg/dL (ref 8.9–10.3)
Chloride: 97 mmol/L — ABNORMAL LOW (ref 101–111)
GFR calc Af Amer: >60 mL/min (ref >60)
GFR calc non Af Amer: >60 mL/min (ref >60)
GLUCOSE: 119 mg/dL — AB (ref 65–99)
Potassium: 3.7 mmol/L (ref 3.5–5.1)
Sodium: 136 mmol/L (ref 135–145)

## 2015-08-14 LAB — CBC
HCT: 45 % (ref 35.0–47.0)
Hemoglobin: 15.7 g/dL (ref 12.0–16.0)
MCH: 31.7 pg (ref 26.0–34.0)
MCHC: 34.8 g/dL (ref 32.0–36.0)
MCV: 91 fL (ref 80.0–100.0)
Platelets: 200 10*3/uL (ref 150–440)
RBC: 4.95 MIL/uL (ref 3.80–5.20)
RDW: 12.9 % (ref 11.5–14.5)
WBC: 11.1 10*3/uL — ABNORMAL HIGH (ref 3.6–11.0)

## 2015-08-14 LAB — URINALYSIS COMPLETE WITH MICROSCOPIC (ARMC ONLY)
Bacteria, UA: NONE SEEN
Bilirubin Urine: NEGATIVE
GLUCOSE, UA: NEGATIVE mg/dL
Hgb urine dipstick: NEGATIVE
Ketones, ur: NEGATIVE mg/dL
Nitrite: NEGATIVE
Protein, ur: 100 mg/dL — AB
SPECIFIC GRAVITY, URINE: 1.02 (ref 1.005–1.030)
pH: 7 (ref 5.0–8.0)

## 2015-08-14 MED ORDER — DIATRIZOATE MEGLUMINE & SODIUM 66-10 % PO SOLN
15.0000 mL | Freq: Once | ORAL | Status: AC
  Administered 2015-08-14: 15 mL via ORAL

## 2015-08-14 MED ORDER — CEPHALEXIN 500 MG PO CAPS: 500.0000 mg | ORAL_CAPSULE | Freq: Three times a day (TID) | ORAL | Status: DC

## 2015-08-14 MED ORDER — IOPAMIDOL (ISOVUE-300) INJECTION 61%
100.0000 mL | Freq: Once | INTRAVENOUS | Status: AC | PRN
  Administered 2015-08-14: 100 mL via INTRAVENOUS

## 2015-08-14 MED ORDER — DEXTROSE 5 % IV SOLN
1.0000 g | INTRAVENOUS | Status: DC
  Administered 2015-08-14: 1 g via INTRAVENOUS
  Filled 2015-08-14: qty 10

## 2015-08-14 MED ORDER — SODIUM CHLORIDE 0.9 % IV BOLUS (SEPSIS)
1000.0000 mL | Freq: Once | INTRAVENOUS | Status: AC
  Administered 2015-08-14: 1000 mL via INTRAVENOUS

## 2015-08-14 NOTE — ED Notes (Signed)
Pt. States lt. Lower quadrant pain that is starting to traverse to the right lower quadrant.  Pt. Denies dysuria, but states urine frequency for some time.

## 2015-08-14 NOTE — ED Notes (Signed)
Pt to BR with steady gait to attempt to collect urine specimen

## 2015-08-14 NOTE — Discharge Instructions (Signed)
1. Take antibiotic as prescribed (Keflex 500 mg 3 times daily 7 days). 2. Return to the ER for worsening symptoms, persistent vomiting, fever or other concerns.  Abdominal Pain, Adult Many things can cause abdominal pain. Usually, abdominal pain is not caused by a disease and will improve without treatment. It can often be observed and treated at home. Your health care provider will do a physical exam and possibly order blood tests and X-rays to help determine the seriousness of your pain. However, in many cases, more time must pass before a clear cause of the pain can be found. Before that point, your health care provider may not know if you need more testing or further treatment. HOME CARE INSTRUCTIONS Monitor your abdominal pain for any changes. The following actions may help to alleviate any discomfort you are experiencing:  Only take over-the-counter or prescription medicines as directed by your health care provider.  Do not take laxatives unless directed to do so by your health care provider.  Try a clear liquid diet (broth, tea, or water) as directed by your health care provider. Slowly move to a bland diet as tolerated. SEEK MEDICAL CARE IF:  You have unexplained abdominal pain.  You have abdominal pain associated with nausea or diarrhea.  You have pain when you urinate or have a bowel movement.  You experience abdominal pain that wakes you in the night.  You have abdominal pain that is worsened or improved by eating food.  You have abdominal pain that is worsened with eating fatty foods.  You have a fever. SEEK IMMEDIATE MEDICAL CARE IF:  Your pain does not go away within 2 hours.  You keep throwing up (vomiting).  Your pain is felt only in portions of the abdomen, such as the right side or the left lower portion of the abdomen.  You pass bloody or black tarry stools. MAKE SURE YOU:  Understand these instructions.  Will watch your condition.  Will get help right  away if you are not doing well or get worse.   This information is not intended to replace advice given to you by your health care provider. Make sure you discuss any questions you have with your health care provider.   Document Released: 10/25/2004 Document Revised: 10/06/2014 Document Reviewed: 09/24/2012 Elsevier Interactive Patient Education 2016 Else[employer]  Urinary Tract Infection Urinary tract infections (UTIs) can develop anywhere along your urinary tract. Your urinary tract is your body's drainage system for removing wastes and extra water. Your urinary tract includes two kidneys, two ureters, a bladder, and a urethra. Your kidneys are a pair of bean-shaped organs. Each kidney is about the size of your fist. They are located below your ribs, one on each side of your spine. CAUSES Infections are caused by microbes, which are microscopic organisms, including fungi, viruses, and bacteria. These organisms are so small that they can only be seen through a microscope. Bacteria are the microbes that most commonly cause UTIs. SYMPTOMS  Symptoms of UTIs may vary by age and gender of the patient and by the location of the infection. Symptoms in young women typically include a frequent and intense urge to urinate and a painful, burning feeling in the bladder or urethra during urination. Older women and men are more likely to be tired, shaky, and weak and have muscle aches and abdominal pain. A fever may mean the infection is in your kidneys. Other symptoms of a kidney infection include pain in your back or sides below the  ribs, nausea, and vomiting. DIAGNOSIS To diagnose a UTI, your caregiver will ask you about your symptoms. Your caregiver will also ask you to provide a urine sample. The urine sample will be tested for bacteria and white blood cells. White blood cells are made by your body to help fight infection. TREATMENT  Typically, UTIs can be treated with medication. Because most UTIs are  caused by a bacterial infection, they usually can be treated with the use of antibiotics. The choice of antibiotic and length of treatment depend on your symptoms and the type of bacteria causing your infection. HOME CARE INSTRUCTIONS  If you were prescribed antibiotics, take them exactly as your caregiver instructs you. Finish the medication even if you feel better after you have only taken some of the medication.  Drink enough water and fluids to keep your urine clear or pale yellow.  Avoid caffeine, tea, and carbonated beverages. They tend to irritate your bladder.  Empty your bladder often. Avoid holding urine for long periods of time.  Empty your bladder before and after sexual intercourse.  After a bowel movement, women should cleanse from front to back. Use each tissue only once. SEEK MEDICAL CARE IF:   You have back pain.  You develop a fever.  Your symptoms do not begin to resolve within 3 days. SEEK IMMEDIATE MEDICAL CARE IF:   You have severe back pain or lower abdominal pain.  You develop chills.  You have nausea or vomiting.  You have continued burning or discomfort with urination. MAKE SURE YOU:   Understand these instructions.  Will watch your condition.  Will get help right away if you are not doing well or get worse.   This information is not intended to replace advice given to you by your health care provider. Make sure you discuss any questions you have with your health care provider.   Document Released: 10/25/2004 Document Revised: 10/06/2014 Document Reviewed: 02/23/2011 Elsevier Interactive Patient Education [employer].

## 2015-08-14 NOTE — ED Notes (Addendum)
Pt c/o left flank pain since Saturday evening; denies urinary s/s; last BM tonight, small amount; did have to strain a little bit to go; denies N/V; pt says pain not as bad as it was when it started, rates 2/10; took 2 tylenol which seems to have eased her pain

## 2015-08-14 NOTE — ED Notes (Signed)
esig does not work.

## 2015-08-14 NOTE — ED Provider Notes (Signed)
[facility] Emergency Department Provider Note   ____________________________________________  Time seen: Approximately 5:16 AM  I have reviewed the triage vital signs and the nursing notes.   HISTORY  Chief Complaint Flank Pain    HPI [name] [name] is a 82 y.o. female who presents to the ED from home with a chief complaint of left lower quadrant abdominal pain. Onset of pain approximately 9 PM. Patient denies associated radiation, fever, chills, chest pain, shortness of breath, nausea, vomiting, diarrhea. Does complain of dysuria and urinary frequency. Patient does have a prior history of diverticulitis. Denies recent travel or trauma. Nothing makes her pain worse. Patient took 2 Tylenol prior to arrival and her pain seems to be improved.   Past Medical History  Diagnosis Date  . Hypertension   . Pure hypercholesterolemia   . Constipation, chronic   . Osteopenia   . Anxiety   . Diverticular disease   . Hearing deficit   . Cancer     Squamous Cell Carcinoma (top of head)  . Internal hemorrhoids     There are no active problems to display for this patient.   Past Surgical History  Procedure Laterality Date  . Nasal polyp removal    . Colonoscopy with propofol N/A 08/10/2014    Procedure: COLONOSCOPY WITH PROPOFOL;  Surgeon: Lollie Sails, MD;  Location: [facility] ENDOSCOPY;  Service: Endoscopy;  Laterality: N/A;    Current Outpatient Rx  Name  Route  Sig  Dispense  Refill  . ascorbic acid (VITAMIN C) 1000 MG tablet   Oral   Take 1,000 mg by mouth daily.         Marland Kitchen b complex vitamins capsule   Oral   Take 1 capsule by mouth daily.         . beta carotene 25000 UNIT capsule   Oral   Take 25,000 Units by mouth daily.         . Biotin 5 MG CAPS   Oral   Take 5 mg by mouth.         . calcium carbonate 1250 MG capsule   Oral   Take 1,250 mg by mouth daily.         . folic acid (FOLVITE) [id number] MCG tablet   Oral   Take  800 mcg by mouth daily.         . Garlic [id number] MG CAPS   Oral   Take 810 mg by mouth daily.         . hydrochlorothiazide (HYDRODIURIL) 25 MG tablet   Oral   Take 25 mg by mouth daily.         Marland Kitchen lovastatin (MEVACOR) 40 MG tablet   Oral   Take 40 mg by mouth at bedtime.         . Multiple Vitamin (MULTIVITAMIN) tablet   Oral   Take 1 tablet by mouth daily.         . Multiple Vitamins-Minerals (HAIR/SKIN/NAILS PO)   Oral   Take 1 tablet by mouth daily.         Marland Kitchen selenium 50 MCG TABS tablet   Oral   Take 100 mcg by mouth daily.         Marland Kitchen tretinoin (RETIN-A) 0.05 % cream   Topical   Apply 1 Dose topically at bedtime.         . verapamil (CALAN-SR) 180 MG CR tablet   Oral   Take 180 mg by  mouth at bedtime.         . vitamin E 400 UNIT capsule   Oral   Take 400 Units by mouth daily.           Allergies Bactrim and Ciprofloxacin  No family history on file.  Social History Social History  Substance Use Topics  . Smoking status: Former Smoker    Quit date: 01/29/1985  . Smokeless tobacco: Never Used  . Alcohol Use: No    Review of Systems  Constitutional: No fever/chills. Eyes: No visual changes. ENT: No sore throat. Cardiovascular: Denies chest pain. Respiratory: Denies shortness of breath. Gastrointestinal: Positive for abdominal pain.  No nausea, no vomiting.  No diarrhea.  No constipation. Genitourinary: Positive for dysuria. Musculoskeletal: Negative for back pain. Skin: Negative for rash. Neurological: Negative for headaches, focal weakness or numbness.  10-point ROS otherwise negative.  ____________________________________________   PHYSICAL EXAM:  VITAL SIGNS: ED Triage Vitals  Enc Vitals Group     BP 08/14/15 0102 175/62 mmHg     Pulse Rate 08/14/15 0102 69     Resp 08/14/15 0102 18     Temp 08/14/15 0102 98.4 F (36.9 C)     Temp Source 08/14/15 0102 Oral     SpO2 08/14/15 0102 98 %     Weight 08/14/15 0102 135 lb  (61.236 kg)     Height 08/14/15 0102 5\' 3"  (1.6 m)     Head Cir --      Peak Flow --      Pain Score 08/14/15 0108 2     Pain Loc --      Pain Edu? --      Excl. in GC? --     Constitutional: Alert and oriented. Well appearing and in no acute distress. Eyes: Conjunctivae are normal. PERRL. EOMI. Head: Atraumatic. Nose: No congestion/rhinnorhea. Mouth/Throat: Mucous membranes are moist.  Oropharynx non-erythematous. Neck: No stridor.   Cardiovascular: Normal rate, regular rhythm. Grossly normal heart sounds.  Good peripheral circulation. Respiratory: Normal respiratory effort.  No retractions. Lungs CTAB. Gastrointestinal: Soft and mildly tender to palpation left lower quadrant without rebound or guarding. No distention. No abdominal bruits. No CVA tenderness. Musculoskeletal: No lower extremity tenderness nor edema.  No joint effusions. Neurologic:  Normal speech and language. No gross focal neurologic deficits are appreciated. No gait instability. Skin:  Skin is warm, dry and intact. No rash noted. Psychiatric: Mood and affect are normal. Speech and behavior are normal.  ____________________________________________   LABS (all labs ordered are listed, but only abnormal results are displayed)  Labs Reviewed  URINALYSIS COMPLETEWITH MICROSCOPIC ([facility] ONLY) - Abnormal; Notable for the following:    Color, Urine YELLOW (*)    APPearance HAZY (*)    Protein, ur 100 (*)    Leukocytes, UA 1+ (*)    Squamous Epithelial / LPF 0-5 (*)    All other components within normal limits  BASIC METABOLIC PANEL - Abnormal; Notable for the following:    Chloride 97 (*)    Glucose, Bld 119 (*)    BUN 22 (*)    All other components within normal limits  CBC - Abnormal; Notable for the following:    WBC 11.1 (*)    All other components within normal limits  URINE CULTURE    ____________________________________________  EKG  None ____________________________________________  RADIOLOGY  CT abdomen and pelvis with contrast interpreted per Dr. Andria Meuse: No acute process demonstrated in the abdomen or pelvis. Multiple small cysts in the  liver. 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 No small bowel obstruction or inflammation. ____________________________________________   PROCEDURES  Procedure(s) performed: None  Procedures  Critical Care performed: No  ____________________________________________   INITIAL IMPRESSION / ASSESSMENT AND PLAN / ED COURSE  Pertinent labs & imaging results that were available during my care of the patient were reviewed by me and considered in my medical decision making (see chart for details).  82 year old female who presents with left lower quadrant abdominal pain and dysuria. History of diverticulitis. Laboratory urinalysis results remarkable for UTI. Given patient's prior history of diverticulitis, will obtain CT abdomen/pelvis to evaluate for perforation/abscess.  ----------------------------------------- 7:14 AM on 08/14/2015 -----------------------------------------  Updated patient and spouse of CT imaging results. Urine culture is pending. Will place patient on Keflex and she will follow-up with her PCP next week. Strict return precautions given. Patient verbalizes understanding and agrees with plan of care. ____________________________________________   FINAL CLINICAL IMPRESSION(S) / ED DIAGNOSES  Final diagnoses:  Left lower quadrant pain  UTI (lower urinary tract infection)      NEW MEDICATIONS STARTED DURING THIS VISIT:  New Prescriptions   No medications on file     Note:  This document was prepared using Dragon voice recognition software and may include unintentional dictation errors.    Paulette Blanch, MD 08/14/15 [phone]

## 2015-08-15 LAB — URINE CULTURE: Special Requests: NORMAL

## 2015-11-11 ENCOUNTER — Encounter: Payer: Self-pay | Admitting: *Deleted

## 2015-11-14 NOTE — Discharge Instructions (Signed)

## 2015-11-16 ENCOUNTER — Ambulatory Visit
Admission: RE | Admit: 2015-11-16 | Discharge: 2015-11-16 | Disposition: A | Discharge status: Home / Self Care | Payer: Medicare Other | Source: Ambulatory Visit | Attending: Ophthalmology | Admitting: Ophthalmology

## 2015-11-16 ENCOUNTER — Encounter
Admission: RE | Disposition: A | Discharge status: Home / Self Care | Payer: Self-pay | Source: Ambulatory Visit | Attending: Ophthalmology

## 2015-11-16 ENCOUNTER — Ambulatory Visit: Payer: Medicare Other | Admitting: Student in an Organized Health Care Education/Training Program

## 2015-11-16 DIAGNOSIS — H2512 Age-related nuclear cataract, left eye: Secondary | ICD-10-CM | POA: Insufficient documentation

## 2015-11-16 DIAGNOSIS — I10 Essential (primary) hypertension: Secondary | ICD-10-CM | POA: Insufficient documentation

## 2015-11-16 DIAGNOSIS — Z87891 Personal history of nicotine dependence: Secondary | ICD-10-CM | POA: Insufficient documentation

## 2015-11-16 HISTORY — DX: Presence of external hearing-aid: Z97.4

## 2015-11-16 HISTORY — PX: CATARACT EXTRACTION W/PHACO: SHX586

## 2015-11-16 HISTORY — DX: Personal history of other diseases of the nervous system and sense organs: Z86.69

## 2015-11-16 HISTORY — DX: Unspecified hearing loss, left ear: H91.92

## 2015-11-16 SURGERY — PHACOEMULSIFICATION, CATARACT, WITH IOL INSERTION
Anesthesia: Monitor Anesthesia Care | Site: Eye | Laterality: Left | Wound class: Clean

## 2015-11-16 MED ORDER — POLYMYXIN B-TRIMETHOPRIM 10000-0.1 UNIT/ML-% OP SOLN
1.0000 [drp] | OPHTHALMIC | Status: AC
  Administered 2015-11-16 (×3): 1 [drp] via OPHTHALMIC

## 2015-11-16 MED ORDER — ARMC OPHTHALMIC DILATING DROPS
1.0000 "application " | OPHTHALMIC | Status: DC | PRN
  Administered 2015-11-16 (×3): 1 via OPHTHALMIC

## 2015-11-16 MED ORDER — BSS IO SOLN
INTRAOCULAR | Status: DC | PRN
  Administered 2015-11-16: 67 mL via OPHTHALMIC

## 2015-11-16 MED ORDER — MIDAZOLAM HCL 2 MG/2ML IJ SOLN
INTRAMUSCULAR | Status: DC | PRN
  Administered 2015-11-16: 0.5 mg via INTRAVENOUS

## 2015-11-16 MED ORDER — TIMOLOL MALEATE 0.5 % OP SOLN
OPHTHALMIC | Status: DC | PRN
  Administered 2015-11-16: 1 [drp] via OPHTHALMIC

## 2015-11-16 MED ORDER — BRIMONIDINE TARTRATE 0.2 % OP SOLN
OPHTHALMIC | Status: DC | PRN
Start: 2015-11-16 — End: 2015-11-16
  Administered 2015-11-16: 1 [drp] via OPHTHALMIC

## 2015-11-16 MED ORDER — CEFUROXIME OPHTHALMIC INJECTION 1 MG/0.1 ML
INJECTION | OPHTHALMIC | Status: DC | PRN
  Administered 2015-11-16: 0.1 mL via INTRACAMERAL

## 2015-11-16 MED ORDER — LIDOCAINE HCL (PF) 4 % IJ SOLN
INTRAOCULAR | Status: DC | PRN
  Administered 2015-11-16: 1 mL via OPHTHALMIC

## 2015-11-16 MED ORDER — FENTANYL CITRATE (PF) 100 MCG/2ML IJ SOLN
INTRAMUSCULAR | Status: DC | PRN
  Administered 2015-11-16 (×2): 25 ug via INTRAVENOUS

## 2015-11-16 MED ORDER — LACTATED RINGERS IV SOLN: INTRAVENOUS | Status: DC

## 2015-11-16 MED ORDER — NA HYALUR & NA CHOND-NA HYALUR 0.4-0.35 ML IO KIT
PACK | INTRAOCULAR | Status: DC | PRN
  Administered 2015-11-16: 1 mL via INTRAOCULAR

## 2015-11-16 SURGICAL SUPPLY — 29 items
CANNULA ANT/CHMB 27G (MISCELLANEOUS) ×1 IMPLANT
CANNULA ANT/CHMB 27GA (MISCELLANEOUS) ×2 IMPLANT
CARTRIDGE ABBOTT (MISCELLANEOUS) IMPLANT
GLOVE SURG LX 7.5 STRW (GLOVE) ×1
GLOVE SURG LX STRL 7.5 STRW (GLOVE) ×1 IMPLANT
GLOVE SURG TRIUMPH 8.0 PF LTX (GLOVE) ×2 IMPLANT
GOWN STRL REUS W/ TWL LRG LVL3 (GOWN DISPOSABLE) ×2 IMPLANT
GOWN STRL REUS W/TWL LRG LVL3 (GOWN DISPOSABLE) ×4
LENS IOL ACRSF IQ TRC 6 20.0 (Intraocular Lens) IMPLANT
LENS IOL ACRYSOF IQ TORIC 20.0 (Intraocular Lens) ×2 IMPLANT
LENS IOL IQ TORIC 6 20.0 (Intraocular Lens) ×1 IMPLANT
MARKER SKIN DUAL TIP RULER LAB (MISCELLANEOUS) ×2 IMPLANT
NDL FILTER BLUNT 18X1 1/2 (NEEDLE) ×1 IMPLANT
NDL RETROBULBAR .5 NSTRL (NEEDLE) IMPLANT
NEEDLE FILTER BLUNT 18X 1/2SAF (NEEDLE) ×2
NEEDLE FILTER BLUNT 18X1 1/2 (NEEDLE) ×2 IMPLANT
PACK CATARACT BRASINGTON (MISCELLANEOUS) ×2 IMPLANT
PACK EYE AFTER SURG (MISCELLANEOUS) ×2 IMPLANT
PACK OPTHALMIC (MISCELLANEOUS) ×2 IMPLANT
RING MALYGIN 7.0 (MISCELLANEOUS) IMPLANT
SUT ETHILON 10-0 CS-B-6CS-B-6 (SUTURE)
SUT VICRYL  9 0 (SUTURE)
SUT VICRYL 9 0 (SUTURE) IMPLANT
SUTURE EHLN 10-0 CS-B-6CS-B-6 (SUTURE) IMPLANT
SYR 3ML LL SCALE MARK (SYRINGE) ×3 IMPLANT
SYR 5ML LL (SYRINGE) ×2 IMPLANT
SYR TB 1ML LUER SLIP (SYRINGE) ×2 IMPLANT
WATER STERILE IRR 250ML POUR (IV SOLUTION) ×2 IMPLANT
WIPE NON LINTING 3.25X3.25 (MISCELLANEOUS) ×2 IMPLANT

## 2015-11-16 NOTE — H&P (Signed)
The History and Physical notes are on paper, have been signed, and are to be scanned. The patient remains stable and unchanged from the H&P.   Previous H&P reviewed, patient examined, and there are no changes.  [name] [name] 11/16/2015 10:13 AM

## 2015-11-16 NOTE — Transfer of Care (Signed)
Immediate Anesthesia Transfer of Care Note  Patient: [name] [name]  Procedure(s) Performed: Procedure(s) with comments: CATARACT EXTRACTION PHACO AND INTRAOCULAR LENS PLACEMENT ([place]) (Left) - TORIC  Patient Location: PACU  Anesthesia Type: MAC  Level of Consciousness: awake, alert  and patient cooperative  Airway and Oxygen Therapy: Patient Spontanous Breathing and Patient connected to supplemental oxygen  Post-op Assessment: Post-op Vital signs reviewed, Patient's Cardiovascular Status Stable, Respiratory Function Stable, Patent Airway and No signs of Nausea or vomiting  Post-op Vital Signs: Reviewed and stable  Complications: No apparent anesthesia complications

## 2015-11-16 NOTE — Anesthesia Preprocedure Evaluation (Signed)
Anesthesia Evaluation  Patient identified by MRN, date of birth, ID band Patient awake    Reviewed: Allergy & Precautions, H&P , NPO status , Patient's Chart, lab work & pertinent test results, reviewed documented beta blocker date and time   Airway Mallampati: II  TM Distance: >3 FB Neck ROM: full    Dental no notable dental hx. (+) Implants   Pulmonary former smoker,    Pulmonary exam normal breath sounds clear to auscultation       Cardiovascular Exercise Tolerance: Good hypertension,  Rhythm:regular Rate:Normal     Neuro/Psych negative neurological ROS  negative psych ROS   GI/Hepatic negative GI ROS, Neg liver ROS,   Endo/Other  negative endocrine ROS  Renal/GU negative Renal ROS  negative genitourinary   Musculoskeletal   Abdominal   Peds  Hematology negative hematology ROS (+)   Anesthesia Other Findings   Reproductive/Obstetrics negative OB ROS                             Anesthesia Physical Anesthesia Plan  ASA: II  Anesthesia Plan: MAC   Post-op Pain Management:    Induction:   Airway Management Planned:   Additional Equipment:   Intra-op Plan:   Post-operative Plan:   Informed Consent: I have reviewed the patients History and Physical, chart, labs and discussed the procedure including the risks, benefits and alternatives for the proposed anesthesia with the patient or authorized representative who has indicated his/her understanding and acceptance.   Dental Advisory Given  Plan Discussed with: CRNA and Anesthesiologist  Anesthesia Plan Comments:         Anesthesia Quick Evaluation

## 2015-11-16 NOTE — Op Note (Signed)
LOCATION:  [facility]   PREOPERATIVE DIAGNOSIS:  Nuclear sclerotic cataract of the left eye.  H25.12  POSTOPERATIVE DIAGNOSIS:  Nuclear sclerotic cataract of the left eye.   PROCEDURE:  Phacoemulsification with Toric posterior chamber intraocular lens placement of the left eye.   LENS:  Implant Name Type Inv. Item Serial No. Manufacturer Lot No. LRB No. Used  ACRYSOF IQ TORIC LENS Intraocular Lens   [id number] ALCON   Left 1   [id number] 20.0 D Toric intraocular lens with 3.75 diopters of cylindrical power with axis orientation at 169 degrees.   ULTRASOUND TIME: 15 % of 1 minutes, 25 seconds.  CDE 12.9   SURGEON:  Wyonia Hough, MD   ANESTHESIA:  Topical with tetracaine drops and 2% Xylocaine jelly, augmented with 1% preservative-free intracameral lidocaine.  COMPLICATIONS:  None.   DESCRIPTION OF PROCEDURE:  The patient was identified in the holding room and transported to the operating suite and placed in the supine position under the operating microscope.  The left eye was identified as the operative eye, and it was prepped and draped in the usual sterile ophthalmic fashion.    A clear-corneal paracentesis incision was made at the 1:30 position.  0.5 ml of preservative-free 1% lidocaine was injected into the anterior chamber. The anterior chamber was filled with Viscoat.  A 2.4 millimeter near clear corneal incision was then made at the 10:30 position.  A cystotome and capsulorrhexis forceps were then used to make a curvilinear capsulorrhexis.  Hydrodissection and hydrodelineation were then performed using balanced salt solution.   Phacoemulsification was then used in stop and chop fashion to remove the lens, nucleus and epinucleus.  The remaining cortex was aspirated using the irrigation and aspiration handpiece.  Provisc viscoelastic was then placed into the capsular bag to distend it for lens placement.  The Verion digital marker was used to align the implant at the  intended axis.   A 20.0 diopter lens was then injected into the capsular bag.  It was rotated clockwise until the axis marks on the lens were approximately 15 degrees in the counterclockwise direction to the intended alignment.  The viscoelastic was aspirated from the eye using the irrigation aspiration handpiece.  Then, a Koch spatula through the sideport incision was used to rotate the lens in a clockwise direction until the axis markings of the intraocular lens were lined up with the Verion alignment.  Balanced salt solution was then used to hydrate the wounds. Cefuroxime 0.1 ml of a 10mg /ml solution was injected into the anterior chamber for a dose of 1 mg of intracameral antibiotic at the completion of the case.    The eye was noted to have a physiologic pressure and there was no wound leak noted.   Timolol and Brimonidine drops were applied to the eye.  The patient was taken to the recovery room in stable condition having had no complications of anesthesia or surgery.  Alexandro Line 11/16/2015, 11:14 AM

## 2015-11-16 NOTE — Anesthesia Postprocedure Evaluation (Signed)
Anesthesia Post Note  Patient: [name] [name]  Procedure(s) Performed: Procedure(s) (LRB): CATARACT EXTRACTION PHACO AND INTRAOCULAR LENS PLACEMENT (IOC) (Left)  Patient location during evaluation: PACU Anesthesia Type: MAC Level of consciousness: awake and alert Pain management: pain level controlled Vital Signs Assessment: post-procedure vital signs reviewed and stable Respiratory status: spontaneous breathing, nonlabored ventilation and respiratory function stable Cardiovascular status: stable and blood pressure returned to baseline Anesthetic complications: no    Trecia Rogers

## 2015-11-16 NOTE — Anesthesia Procedure Notes (Signed)
Procedure Name: MAC Date/Time: 11/16/2015 10:55 AM Performed by: Janna Arch Pre-anesthesia Checklist: Patient identified, Emergency Drugs available, Suction available, Patient being monitored and Timeout performed Patient Re-evaluated:Patient Re-evaluated prior to inductionOxygen Delivery Method: Nasal cannula

## 2015-11-17 ENCOUNTER — Encounter: Payer: Self-pay | Admitting: Ophthalmology

## 2015-11-30 ENCOUNTER — Encounter: Payer: Self-pay | Admitting: *Deleted

## 2015-11-30 NOTE — Discharge Instructions (Signed)

## 2015-12-06 MED ORDER — ACETAMINOPHEN 160 MG/5ML PO SOLN: 325.0000 mg | ORAL | Status: DC | PRN

## 2015-12-06 MED ORDER — ACETAMINOPHEN 325 MG PO TABS: 325.0000 mg | ORAL_TABLET | ORAL | Status: DC | PRN

## 2015-12-07 ENCOUNTER — Ambulatory Visit
Admission: RE | Admit: 2015-12-07 | Discharge: 2015-12-07 | Disposition: A | Discharge status: Home / Self Care | Payer: Medicare Other | Source: Ambulatory Visit | Attending: Ophthalmology | Admitting: Ophthalmology

## 2015-12-07 ENCOUNTER — Encounter
Admission: RE | Disposition: A | Discharge status: Home / Self Care | Payer: Self-pay | Source: Ambulatory Visit | Attending: Ophthalmology

## 2015-12-07 ENCOUNTER — Ambulatory Visit: Payer: Medicare Other | Admitting: Anesthesiology

## 2015-12-07 DIAGNOSIS — I10 Essential (primary) hypertension: Secondary | ICD-10-CM | POA: Diagnosis not present

## 2015-12-07 DIAGNOSIS — Z79899 Other long term (current) drug therapy: Secondary | ICD-10-CM | POA: Insufficient documentation

## 2015-12-07 DIAGNOSIS — Z87891 Personal history of nicotine dependence: Secondary | ICD-10-CM | POA: Diagnosis not present

## 2015-12-07 DIAGNOSIS — H2511 Age-related nuclear cataract, right eye: Secondary | ICD-10-CM | POA: Diagnosis present

## 2015-12-07 HISTORY — PX: CATARACT EXTRACTION W/PHACO: SHX586

## 2015-12-07 SURGERY — PHACOEMULSIFICATION, CATARACT, WITH IOL INSERTION
Anesthesia: Monitor Anesthesia Care | Laterality: Right | Wound class: Clean

## 2015-12-07 MED ORDER — BALANCED SALT IO SOLN
INTRAOCULAR | Status: DC | PRN
  Administered 2015-12-07: 1 mL via OPHTHALMIC

## 2015-12-07 MED ORDER — NA HYALUR & NA CHOND-NA HYALUR 0.4-0.35 ML IO KIT
PACK | INTRAOCULAR | Status: DC | PRN
  Administered 2015-12-07: 1 mL via INTRAOCULAR

## 2015-12-07 MED ORDER — EPINEPHRINE PF 1 MG/ML IJ SOLN
INTRAOCULAR | Status: DC | PRN
  Administered 2015-12-07: 51 mL via OPHTHALMIC

## 2015-12-07 MED ORDER — FENTANYL CITRATE (PF) 100 MCG/2ML IJ SOLN
INTRAMUSCULAR | Status: DC | PRN
  Administered 2015-12-07: 50 ug via INTRAVENOUS

## 2015-12-07 MED ORDER — TIMOLOL MALEATE 0.5 % OP SOLN
OPHTHALMIC | Status: DC | PRN
  Administered 2015-12-07: 1 [drp] via OPHTHALMIC

## 2015-12-07 MED ORDER — POLYMYXIN B-TRIMETHOPRIM 10000-0.1 UNIT/ML-% OP SOLN
1.0000 [drp] | OPHTHALMIC | Status: AC | PRN
  Administered 2015-12-07 (×3): 1 [drp] via OPHTHALMIC

## 2015-12-07 MED ORDER — LACTATED RINGERS IV SOLN: INTRAVENOUS | Status: DC

## 2015-12-07 MED ORDER — CEFUROXIME OPHTHALMIC INJECTION 1 MG/0.1 ML
INJECTION | OPHTHALMIC | Status: DC | PRN
  Administered 2015-12-07: .3 mL via INTRACAMERAL

## 2015-12-07 MED ORDER — MIDAZOLAM HCL 2 MG/2ML IJ SOLN
INTRAMUSCULAR | Status: DC | PRN
Start: 2015-12-07 — End: 2015-12-07
  Administered 2015-12-07: 2 mg via INTRAVENOUS

## 2015-12-07 MED ORDER — ARMC OPHTHALMIC DILATING DROPS
1.0000 "application " | OPHTHALMIC | Status: DC | PRN
  Administered 2015-12-07 (×3): 1 via OPHTHALMIC

## 2015-12-07 MED ORDER — BRIMONIDINE TARTRATE 0.2 % OP SOLN
OPHTHALMIC | Status: DC | PRN
  Administered 2015-12-07: 1 [drp] via OPHTHALMIC

## 2015-12-07 SURGICAL SUPPLY — 27 items
CANNULA ANT/CHMB 27G (MISCELLANEOUS) ×1 IMPLANT
CANNULA ANT/CHMB 27GA (MISCELLANEOUS) ×2 IMPLANT
CARTRIDGE ABBOTT (MISCELLANEOUS) IMPLANT
GLOVE SURG LX 7.5 STRW (GLOVE) ×1
GLOVE SURG LX STRL 7.5 STRW (GLOVE) ×1 IMPLANT
GLOVE SURG TRIUMPH 8.0 PF LTX (GLOVE) ×2 IMPLANT
GOWN STRL REUS W/ TWL LRG LVL3 (GOWN DISPOSABLE) ×2 IMPLANT
GOWN STRL REUS W/TWL LRG LVL3 (GOWN DISPOSABLE) ×4
MARKER SKIN DUAL TIP RULER LAB (MISCELLANEOUS) ×2 IMPLANT
NDL FILTER BLUNT 18X1 1/2 (NEEDLE) ×1 IMPLANT
NDL RETROBULBAR .5 NSTRL (NEEDLE) IMPLANT
NEEDLE FILTER BLUNT 18X 1/2SAF (NEEDLE) ×1
NEEDLE FILTER BLUNT 18X1 1/2 (NEEDLE) ×1 IMPLANT
PACK CATARACT BRASINGTON (MISCELLANEOUS) ×2 IMPLANT
PACK EYE AFTER SURG (MISCELLANEOUS) ×2 IMPLANT
PACK OPTHALMIC (MISCELLANEOUS) ×2 IMPLANT
RING MALYGIN 7.0 (MISCELLANEOUS) IMPLANT
SUT ETHILON 10-0 CS-B-6CS-B-6 (SUTURE)
SUT VICRYL  9 0 (SUTURE)
SUT VICRYL 9 0 (SUTURE) IMPLANT
SUTURE EHLN 10-0 CS-B-6CS-B-6 (SUTURE) IMPLANT
SYR 3ML LL SCALE MARK (SYRINGE) ×2 IMPLANT
SYR 5ML LL (SYRINGE) ×2 IMPLANT
SYR TB 1ML LUER SLIP (SYRINGE) ×2 IMPLANT
WATER STERILE IRR 250ML POUR (IV SOLUTION) ×2 IMPLANT
WIPE NON LINTING 3.25X3.25 (MISCELLANEOUS) ×2 IMPLANT
lens (Intraocular Lens) ×1 IMPLANT

## 2015-12-07 NOTE — Anesthesia Postprocedure Evaluation (Signed)
Anesthesia Post Note  Patient: [name] [name]  Procedure(s) Performed: Procedure(s) (LRB): CATARACT EXTRACTION PHACO AND INTRAOCULAR LENS PLACEMENT (IOC) (Right)  Patient location during evaluation: PACU Anesthesia Type: MAC Level of consciousness: awake Pain management: pain level controlled Vital Signs Assessment: post-procedure vital signs reviewed and stable Respiratory status: spontaneous breathing Cardiovascular status: blood pressure returned to baseline Postop Assessment: no headache Anesthetic complications: no    Jaci Standard, III,  Yu Peggs D

## 2015-12-07 NOTE — Op Note (Signed)
LOCATION:  [place]   PREOPERATIVE DIAGNOSIS:  Nuclear sclerotic cataract of the right eye.  H25.11   POSTOPERATIVE DIAGNOSIS:  Nuclear sclerotic cataract of the right eye.   PROCEDURE:  Phacoemulsification with Toric posterior chamber intraocular lens placement of the right eye.   LENS:   Implant Name Type Inv. Item Serial No. Manufacturer Lot No. LRB No. Used               1     [id number] 19.0 D  Toric intraocular lens with 3.75 diopters of cylindrical power with axis orientation at 8 degrees.   ULTRASOUND TIME: 19 % of 1 minutes, 7 seconds.  CDE 12.8   SURGEON:  Wyonia Hough, MD   ANESTHESIA: Topical with tetracaine drops and 2% Xylocaine jelly, augmented with 1% preservative-free intracameral lidocaine. .   COMPLICATIONS:  None.   DESCRIPTION OF PROCEDURE:  The patient was identified in the holding room and transported to the operating suite and placed in the supine position under the operating microscope.  The right eye was identified as the operative eye, and it was prepped and draped in the usual sterile ophthalmic fashion.    A clear-corneal paracentesis incision was made at the 12:00 position.  0.5 ml of preservative-free 1% lidocaine was injected into the anterior chamber. The anterior chamber was filled with Viscoat.  A 2.4 millimeter near clear corneal incision was then made at the 9:00 position.  A cystotome and capsulorrhexis forceps were then used to make a curvilinear capsulorrhexis.  Hydrodissection and hydrodelineation were then performed using balanced salt solution.   Phacoemulsification was then used in stop and chop fashion to remove the lens, nucleus and epinucleus.  The remaining cortex was aspirated using the irrigation and aspiration handpiece.  Provisc viscoelastic was then placed into the capsular bag to distend it for lens placement.  The Verion digital marker was used to align the implant at the intended axis.   A Toric lens was then  injected into the capsular bag.  It was rotated clockwise until the axis marks on the lens were approximately 15 degrees in the counterclockwise direction to the intended alignment.  The viscoelastic was aspirated from the eye using the irrigation aspiration handpiece.  Then, a Koch spatula through the sideport incision was used to rotate the lens in a clockwise direction until the axis markings of the intraocular lens were lined up with the Verion alignment.  Balanced salt solution was then used to hydrate the wounds. Cefuroxime 0.1 ml of a 10mg /ml solution was injected into the anterior chamber for a dose of 1 mg of intracameral antibiotic at the completion of the case.    The eye was noted to have a physiologic pressure and there was no wound leak noted.   Timolol and Brimonidine drops were applied to the eye.  The patient was taken to the recovery room in stable condition having had no complications of anesthesia or surgery.  [name] [name] 12/07/2015, 10:15 AM

## 2015-12-07 NOTE — H&P (Signed)
The History and Physical notes are on paper, have been signed, and are to be scanned. The patient remains stable and unchanged from the H&P.   Previous H&P reviewed, patient examined, and there are changes as noted: The right eye conjunctiva was injected with excess tearing and lower lid ectropiion. The cornea was clear and there was no mucus discharge present.Marland Kitchen  [name] [name] 12/07/2015 9:41 AM

## 2015-12-07 NOTE — Anesthesia Procedure Notes (Signed)
Procedure Name: MAC Performed by: Una Yeomans Pre-anesthesia Checklist: Patient identified, Emergency Drugs available, Suction available, Timeout performed and Patient being monitored Patient Re-evaluated:Patient Re-evaluated prior to inductionOxygen Delivery Method: Nasal cannula Placement Confirmation: positive ETCO2     

## 2015-12-07 NOTE — Anesthesia Preprocedure Evaluation (Signed)
Anesthesia Evaluation  Patient identified by MRN, date of birth, ID band Patient awake    Reviewed: Allergy & Precautions, H&P , NPO status   History of Anesthesia Complications Negative for: history of anesthetic complications  Airway Mallampati: II  TM Distance: >3 FB Neck ROM: full    Dental no notable dental hx.    Pulmonary former smoker,    Pulmonary exam normal        Cardiovascular hypertension, On Medications Normal cardiovascular exam     Neuro/Psych    GI/Hepatic negative GI ROS, Neg liver ROS,   Endo/Other  negative endocrine ROS  Renal/GU negative Renal ROS     Musculoskeletal   Abdominal   Peds  Hematology negative hematology ROS (+)   Anesthesia Other Findings   Reproductive/Obstetrics negative OB ROS                             Anesthesia Physical Anesthesia Plan  ASA: II  Anesthesia Plan: MAC   Post-op Pain Management:    Induction:   Airway Management Planned:   Additional Equipment:   Intra-op Plan:   Post-operative Plan:   Informed Consent:   Plan Discussed with:   Anesthesia Plan Comments:         Anesthesia Quick Evaluation

## 2015-12-07 NOTE — Transfer of Care (Signed)
Immediate Anesthesia Transfer of Care Note  Patient: [name] [name]  Procedure(s) Performed: Procedure(s) with comments: CATARACT EXTRACTION PHACO AND INTRAOCULAR LENS PLACEMENT ([place]) (Right) - TORIC  Patient Location: PACU  Anesthesia Type: MAC  Level of Consciousness: awake, alert  and patient cooperative  Airway and Oxygen Therapy: Patient Spontanous Breathing and Patient connected to supplemental oxygen  Post-op Assessment: Post-op Vital signs reviewed, Patient's Cardiovascular Status Stable, Respiratory Function Stable, Patent Airway and No signs of Nausea or vomiting  Post-op Vital Signs: Reviewed and stable  Complications: No apparent anesthesia complications

## 2017-06-25 DIAGNOSIS — Z86010 Personal history of colonic polyps: Secondary | ICD-10-CM | POA: Insufficient documentation

## 2019-07-23 ENCOUNTER — Other Ambulatory Visit: Payer: Self-pay

## 2019-07-23 ENCOUNTER — Emergency Department: Payer: Medicare Other

## 2019-07-23 ENCOUNTER — Emergency Department
Admission: EM | Admit: 2019-07-23 | Discharge: 2019-07-23 | Disposition: A | Discharge status: Home / Self Care | Payer: Medicare Other | Source: Home / Self Care | Attending: Emergency Medicine | Admitting: Emergency Medicine

## 2019-07-23 DIAGNOSIS — I10 Essential (primary) hypertension: Secondary | ICD-10-CM | POA: Insufficient documentation

## 2019-07-23 DIAGNOSIS — S7001XA Contusion of right hip, initial encounter: Secondary | ICD-10-CM

## 2019-07-23 DIAGNOSIS — S7011XA Contusion of right thigh, initial encounter: Secondary | ICD-10-CM | POA: Insufficient documentation

## 2019-07-23 DIAGNOSIS — R52 Pain, unspecified: Secondary | ICD-10-CM

## 2019-07-23 DIAGNOSIS — Y9289 Other specified places as the place of occurrence of the external cause: Secondary | ICD-10-CM | POA: Insufficient documentation

## 2019-07-23 DIAGNOSIS — Z87891 Personal history of nicotine dependence: Secondary | ICD-10-CM | POA: Insufficient documentation

## 2019-07-23 DIAGNOSIS — Y998 Other external cause status: Secondary | ICD-10-CM | POA: Insufficient documentation

## 2019-07-23 DIAGNOSIS — W19XXXA Unspecified fall, initial encounter: Secondary | ICD-10-CM

## 2019-07-23 DIAGNOSIS — E860 Dehydration: Secondary | ICD-10-CM | POA: Diagnosis not present

## 2019-07-23 DIAGNOSIS — S32591A Other specified fracture of right pubis, initial encounter for closed fracture: Secondary | ICD-10-CM | POA: Diagnosis not present

## 2019-07-23 DIAGNOSIS — W010XXA Fall on same level from slipping, tripping and stumbling without subsequent striking against object, initial encounter: Secondary | ICD-10-CM | POA: Insufficient documentation

## 2019-07-23 DIAGNOSIS — Y9389 Activity, other specified: Secondary | ICD-10-CM | POA: Insufficient documentation

## 2019-07-23 MED ORDER — LIDOCAINE 5 % EX PTCH
1.0000 | MEDICATED_PATCH | CUTANEOUS | Status: DC
  Administered 2019-07-23: 1 via TRANSDERMAL
  Filled 2019-07-23: qty 1

## 2019-07-23 MED ORDER — TRAMADOL HCL 50 MG PO TABS
50.0000 mg | ORAL_TABLET | Freq: Once | ORAL | Status: AC
  Administered 2019-07-23: 50 mg via ORAL
  Filled 2019-07-23: qty 1

## 2019-07-23 MED ORDER — BACITRACIN-NEOMYCIN-POLYMYXIN 400-5-5000 EX OINT
TOPICAL_OINTMENT | Freq: Once | CUTANEOUS | Status: AC
  Administered 2019-07-23: 1 via TOPICAL
  Filled 2019-07-23: qty 1

## 2019-07-23 MED ORDER — TRAMADOL HCL 50 MG PO TABS: 50.0000 mg | ORAL_TABLET | Freq: Every day | ORAL | 0 refills | Status: DC

## 2019-07-23 MED ORDER — LIDOCAINE 5 % EX PTCH: 1.0000 | MEDICATED_PATCH | Freq: Two times a day (BID) | CUTANEOUS | 0 refills | Status: DC

## 2019-07-23 NOTE — Evaluation (Signed)
Physical Therapy Evaluation Patient Details Name: [name] [name] MRN: [id number] DOB: 05-19-1929 Today's Date: 07/23/2019   History of Present Illness  Pt admitted for fall in basement and having R hip pain. 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 Consulted in ED to assess for home dc.  Clinical Impression  Pt is a pleasant 86 year old female who was admitted for fall and having R hip pain. Pt performs bed mobility, transfers, and ambulation with cga and USE OF RW. Pt needs to use RW for safety and pain control at this time. Pt demonstrates deficits with strength/pain/mobility. High risk for falls. Problem solved scenarios at home including encouraging pt to reach out to support system for assistance until son can come next week. Would benefit from skilled PT to address above deficits and promote optimal return to PLOF. Recommend transition to [facility] upon discharge from acute hospitalization. May eventually need discussion about LTC in ALF setting.     Follow Up Recommendations Home health PT;Supervision - Intermittent    Equipment Recommendations  None recommended by PT    Recommendations for Other Services       Precautions / Restrictions Precautions Precautions: Fall Restrictions Weight Bearing Restrictions: No      Mobility  Bed Mobility Overal bed mobility: Needs Assistance Bed Mobility: Supine to Sit     Supine to sit: Min guard     General bed mobility comments: pt pulled on bed rails and able to sit at EOB. Once returned to bed, able to reposition in bed without assistance  Transfers Overall transfer level: Needs assistance Equipment used: None Transfers: Sit to/from Stand Sit to Stand: Mod assist         General transfer comment: needed heavy assist for standing without AD. Unable to fully WB on R LE due to pain  Ambulation/Gait             General Gait Details: unable to offload without B UE assist. Further ambulation  performed with AD  Stairs            Wheelchair Mobility    Modified Rankin (Stroke Patients Only)       Balance Overall balance assessment: Mild deficits observed, not formally tested;History of Falls                                           Pertinent Vitals/Pain Pain Assessment: Faces Faces Pain Scale: Hurts little more Pain Location: R hip (increased pain with WBing activities) Pain Descriptors / Indicators: Aching Pain Intervention(s): Limited activity within patient's tolerance;Repositioned    Home Living Family/patient expects to be discharged to:: Private residence Living Arrangements: Alone   Type of Home: House Home Access: Stairs to enter Entrance Stairs-Rails: Can reach both Entrance Stairs-Number of Steps: 4 Home Layout: Multi-level;Bed/bath upstairs Home Equipment: Environmental consultant - 2 wheels;Cane - single point Additional Comments: reports she enjoys staying upstairs in her bedroom and has lots of stairs to enter her home. Also has basement steps    Prior Function Level of Independence: Independent         Comments: very active, walking at the [facility], living indep. husband just passed. No other family in town     Hand Dominance        Extremity/Trunk Assessment   Upper Extremity Assessment Upper Extremity Assessment: Overall WFL for tasks assessed    Lower  Extremity Assessment Lower Extremity Assessment: Generalized weakness (R LE grossly 4/5; pain limited)       Communication   Communication: No difficulties  Cognition Arousal/Alertness: Awake/alert Behavior During Therapy: WFL for tasks assessed/performed Overall Cognitive Status: Within Functional Limits for tasks assessed                                        General Comments      Exercises Other Exercises Other Exercises: attempted transfers/ambulation with RW with improved technique requiring only cga. Heavy B UE use on RW.  Other Exercises: Pt  wheeled to bathroom in transport chair. Able to transfer from [facility] to toliet using RW and cga. Able to perform hygiene safely, however needs supervision. Pt then ambulated from bathroom back to room-approx 30'. Close chair follow due to pain, however not needed   Assessment/Plan    PT Assessment Patient needs continued PT services  PT Problem List Decreased strength;Decreased activity tolerance;Decreased balance;Decreased mobility;Decreased knowledge of use of DME;Pain       PT Treatment Interventions Gait training;DME instruction;Stair training;Therapeutic exercise;Balance training    PT Goals (Current goals can be found in the Care Plan section)  Acute Rehab PT Goals Patient Stated Goal: to go home PT Goal Formulation: With patient Time For Goal Achievement: 08/06/19 Potential to Achieve Goals: Good    Frequency Min 2X/week   Barriers to discharge Decreased caregiver support      Co-evaluation               AM-PAC PT "6 Clicks" Mobility  Outcome Measure Help needed turning from your back to your side while in a flat bed without using bedrails?: A Little Help needed moving from lying on your back to sitting on the side of a flat bed without using bedrails?: A Little Help needed moving to and from a bed to a chair (including a wheelchair)?: A Little Help needed standing up from a chair using your arms (e.g., wheelchair or bedside chair)?: A Little Help needed to walk in hospital room?: A Little Help needed climbing 3-5 steps with a railing? : A Little 6 Click Score: 18    End of Session Equipment Utilized During Treatment: Gait belt Activity Tolerance: Patient limited by pain Patient left: in bed Nurse Communication: Mobility status PT Visit Diagnosis: Unsteadiness on feet (R26.81);Muscle weakness (generalized) (M62.81);History of falling (Z91.81);Difficulty in walking, not elsewhere classified (R26.2);Pain Pain - Right/Left: Right Pain - part of body: Hip    Time:  5520-8022 PT Time Calculation (min) (ACUTE ONLY): 30 min   Charges:   PT Evaluation $PT Eval Moderate Complexity: 1 Mod PT Treatments $Gait Training: 8-22 mins        Greggory Stallion, PT, DPT [phone]   Emersen Carroll 07/23/2019, 5:04 PM

## 2019-07-23 NOTE — Discharge Instructions (Signed)
Your x-rays were negative for fracture.  Internal fixations showed no abnormalities.

## 2019-07-23 NOTE — ED Notes (Signed)
This Rn spoke with Percell Miller (son) to provide status update.

## 2019-07-23 NOTE — ED Notes (Addendum)
This RN assisted pt to bathroom. Pt unable to take steps on her own. Pt st "I live by myself, no family around. I just have neighbors". Pt st she uses a walker and cane at home.  This RN notified PA-C Jori Moll.

## 2019-07-23 NOTE — ED Notes (Signed)
Pt assisted to restroom with walker, stand by assist. Pt reports increased pain in right hip with ambulation

## 2019-07-23 NOTE — ED Provider Notes (Signed)
[facility] Emergency Department Provider Note   ____________________________________________   First MD Initiated Contact with Patient 07/23/19 1031     (approximate)  I have reviewed the triage vital signs and the nursing notes.   HISTORY  Chief Complaint Hip Pain    HPI [name] [name] is a 86 y.o. female patient complain right hip pain secondary to a slip and fall.  Patient says she slipped in her basement last night.  Patient having pain with ambulation.  Patient also has an abrasion to the right elbow.  Patient denies LOC or head injury.  Patient has internal fixation secondary to fractured femur patient states pain only with standing and ambulation.  Rates her pain as a 4/10.  No palliative measure for complaint.    Past Medical History:  Diagnosis Date  . Anxiety   . Cancer ([place])    Squamous Cell Carcinoma (top of head)  . Constipation, chronic   . Deaf, left   . Diverticular disease   . Hearing aid worn   . Hearing deficit   . History of Bell's palsy    left side - resolved - 50 yrs ago  . Hypertension   . Internal hemorrhoids   . Osteopenia   . Pure hypercholesterolemia     There are no problems to display for this patient.   Past Surgical History:  Procedure Laterality Date  . CATARACT EXTRACTION W/PHACO Left 11/16/2015   Procedure: CATARACT EXTRACTION PHACO AND INTRAOCULAR LENS PLACEMENT (IOC);  Surgeon: Leandrew Koyanagi, MD;  Location: [place];  Service: Ophthalmology;  Laterality: Left;  TORIC  . CATARACT EXTRACTION W/PHACO Right 12/07/2015   Procedure: CATARACT EXTRACTION PHACO AND INTRAOCULAR LENS PLACEMENT (IOC);  Surgeon: Leandrew Koyanagi, MD;  Location: [place];  Service: Ophthalmology;  Laterality: Right;  TORIC  . COLONOSCOPY WITH PROPOFOL N/A 08/10/2014   Procedure: COLONOSCOPY WITH PROPOFOL;  Surgeon: Lollie Sails, MD;  Location: [facility] ENDOSCOPY;  Service: Endoscopy;  Laterality:  N/A;  . FEMUR FRACTURE SURGERY    . Nasal Polyp Removal      Prior to Admission medications   Medication Sig Start Date End Date Taking? Authorizing Provider  ascorbic acid (VITAMIN C) 1000 MG tablet Take 1,000 mg by mouth daily.    [provider]  b complex vitamins capsule Take 1 capsule by mouth daily.    [provider]  beta carotene 25000 UNIT capsule Take 25,000 Units by mouth daily.    [provider]  Biotin 5 MG CAPS Take 5 mg by mouth.    [provider]  calcium carbonate 1250 MG capsule Take 1,250 mg by mouth daily.    [provider]  Cholecalciferol (VITAMIN D-3) 1000 units CAPS Take by mouth daily.    [provider]  folic acid (FOLVITE) 962 MCG tablet Take 800 mcg by mouth daily.    [provider]  Garlic 8366 MG CAPS Take 810 mg by mouth daily.    [provider]  hydrochlorothiazide (HYDRODIURIL) 25 MG tablet Take 25 mg by mouth daily.    [provider]  lidocaine (LIDODERM) 5 % Place 1 patch onto the skin every 12 (twelve) hours. Remove & Discard patch within 12 hours or as directed by MD 07/23/19 07/22/20  Sable Feil, PA-C  lovastatin (MEVACOR) 40 MG tablet Take 40 mg by mouth at bedtime.    [provider]  Multiple Vitamin (MULTIVITAMIN) tablet Take 1 tablet by mouth daily.  [provider]  Multiple Vitamins-Minerals (HAIR/SKIN/NAILS PO) Take 1 tablet by mouth daily.    [provider]  polyethylene glycol powder (GLYCOLAX/MIRALAX) powder Take 1 Container by mouth daily.    [provider]  selenium 50 MCG TABS tablet Take 100 mcg by mouth daily.    [provider]  traMADol (ULTRAM) 50 MG tablet Take 1 tablet (50 mg total) by mouth daily at 12 noon for 5 days. 07/23/19 07/28/19  Sable Feil, PA-C  tretinoin (RETIN-A) 0.05 % cream Apply 1 Dose topically at bedtime.    [provider]  verapamil (CALAN-SR) 180 MG CR tablet  Take 180 mg by mouth at bedtime.    [provider]  vitamin E 400 UNIT capsule Take 400 Units by mouth daily.    [provider]    Allergies Bactrim [sulfamethoxazole-trimethoprim] and Ciprofloxacin  No family history on file.  Social History Social History   Tobacco Use  . Smoking status: Former Smoker    Quit date: 10/30/1985    Years since quitting: 33.7  . Smokeless tobacco: Never Used  Substance Use Topics  . Alcohol use: No  . Drug use: No    Review of Systems Constitutional: No fever/chills Eyes: No visual changes. ENT: No sore throat. Cardiovascular: Denies chest pain. Respiratory: Denies shortness of breath. Gastrointestinal: No abdominal pain.  No nausea, no vomiting.  No diarrhea.  No constipation. Genitourinary: Negative for dysuria. Musculoskeletal: Negative for back pain. Skin: Negative for rash. Neurological: Negative for headaches, focal weakness or numbness. Allergic/Immunilogical: Bactrim and Cipro ____________________________________________   PHYSICAL EXAM:  VITAL SIGNS: ED Triage Vitals  Enc Vitals Group     BP 07/23/19 1021 138/69     Pulse Rate 07/23/19 1021 91     Resp 07/23/19 1021 17     Temp 07/23/19 1021 98 F (36.7 C)     Temp Source 07/23/19 1021 Oral     SpO2 07/23/19 1021 99 %     Weight 07/23/19 1023 125 lb (56.7 kg)     Height 07/23/19 1023 5\' 3"  (1.6 m)     Head Circumference --      Peak Flow --      Pain Score 07/23/19 1022 0     Pain Loc --      Pain Edu? --      Excl. in [place]? --    Constitutional: Alert and oriented. Well appearing and in no acute distress. Eyes: Conjunctivae are normal. PERRL. EOMI. Head: Atraumatic. Nose: No congestion/rhinnorhea. Mouth/Throat: Mucous membranes are moist.  Oropharynx non-erythematous. Neck: No cervical spine tenderness to palpation. Hematological/Lymphatic/Immunilogical: No cervical lymphadenopathy. Cardiovascular: Normal rate, regular rhythm. Grossly normal  heart sounds.  Good peripheral circulation. Respiratory: Normal respiratory effort.  No retractions. Lungs CTAB. Gastrointestinal: Soft and nontender. No distention. No abdominal bruits. No CVA tenderness. Genitourinary: Deferred Musculoskeletal: No leg length discrepancy.  No obvious deformity.  Patient has full range of motion.  Moderate guarding palpation to greater trochanter area. Neurologic:  Normal speech and language. No gross focal neurologic deficits are appreciated. No gait instability. Skin:  Skin is warm, dry and intact. No rash noted.  Abrasion right elbow. Psychiatric: Mood and affect are normal. Speech and behavior are normal.  ____________________________________________   LABS (all labs ordered are listed, but only abnormal results are displayed)  Labs Reviewed - No data to display ____________________________________________  EKG   ____________________________________________  RADIOLOGY  ED MD interpretation:    Official radiology report(s): DG Pelvis  1-2 Views  Result Date: 07/23/2019 CLINICAL DATA:  Right hip and lower leg pain. EXAM: PELVIS - 1-2 VIEW; RIGHT FEMUR 2 VIEWS COMPARISON:  06/01/2012 FINDINGS: Postsurgical changes from ORIF of the right hip and femur via long intramedullary rod with distal interlocking screws and proximal lag screw. No periprosthetic lucency or fracture. Hip joint is intact without dislocation. Mild bilateral hip joint space narrowing. Pelvic bony ring appears intact. There is mild osseous demineralization. Sacrum is partially obscured by overlying bowel gas. No focal soft tissue abnormality. IMPRESSION: 1. No acute fracture or dislocation. 2. Postsurgical changes from ORIF of the right hip and femur without evidence of hardware complication. Electronically Signed   By: Davina Poke D.O.   On: 07/23/2019 11:14   DG FEMUR, MIN 2 VIEWS RIGHT  Result Date: 07/23/2019 CLINICAL DATA:  Right hip and lower leg pain. EXAM: PELVIS - 1-2  VIEW; RIGHT FEMUR 2 VIEWS COMPARISON:  06/01/2012 FINDINGS: Postsurgical changes from ORIF of the right hip and femur via long intramedullary rod with distal interlocking screws and proximal lag screw. No periprosthetic lucency or fracture. Hip joint is intact without dislocation. Mild bilateral hip joint space narrowing. Pelvic bony ring appears intact. There is mild osseous demineralization. Sacrum is partially obscured by overlying bowel gas. No focal soft tissue abnormality. IMPRESSION: 1. No acute fracture or dislocation. 2. Postsurgical changes from ORIF of the right hip and femur without evidence of hardware complication. Electronically Signed   By: Davina Poke D.O.   On: 07/23/2019 11:14    ____________________________________________   PROCEDURES  Procedure(s) performed (including Critical Care):  Procedures   ____________________________________________   INITIAL IMPRESSION / ASSESSMENT AND PLAN / ED COURSE  As part of my medical decision making, I reviewed the following data within the [place]     Patient presents with right hip pain secondary to fall.  Patient also sustained an abrasion to the right elbow.  Discussed x-ray findings with patient shows no acute abnormalities.  Patient complaint physical exam is consistent to hip contusion.  Lidoderm patch was applied to the area patient given 1 tramadol.  Elbow abrasion was cleaned and Neosporin applied with a sterile dressing.  Patient given discharge care instruction advised follow-up PCP.          ____________________________________________   FINAL CLINICAL IMPRESSION(S) / ED DIAGNOSES  Final diagnoses:  Contusion of right hip and thigh, initial encounter     ED Discharge Orders         Ordered    lidocaine (LIDODERM) 5 %  Every 12 hours     Discontinue  Reprint     07/23/19 1146    traMADol (ULTRAM) 50 MG tablet  Daily     Discontinue  Reprint     07/23/19 1146           Note:   This document was prepared using Dragon voice recognition software and may include unintentional dictation errors.    Sable Feil, PA-C 07/23/19 1151    Arta Silence, MD 07/23/19 1616

## 2019-07-23 NOTE — ED Notes (Addendum)
This Rn contacted [employer] for pt.

## 2019-07-23 NOTE — ED Notes (Signed)
Pt able to stand with this RN assistance and repositioned on bed.  Pt provided with a status update.

## 2019-07-23 NOTE — ED Triage Notes (Signed)
Pt comes into the Ed via EMS from home, report she slipped and fell in her basement yesterday and is having right hip pain and has an abrasion to the right elbow. EMS reports the pt was ambulatory on there arrival. Pt is a/ox4.

## 2019-07-25 ENCOUNTER — Inpatient Hospital Stay
Admission: EM | Admit: 2019-07-25 | Discharge: 2019-07-28 | DRG: 536 | Disposition: A | Discharge status: Skilled Nursing Facility | Payer: Medicare Other | Attending: Internal Medicine | Admitting: Internal Medicine

## 2019-07-25 ENCOUNTER — Encounter: Payer: Self-pay | Admitting: Radiology

## 2019-07-25 ENCOUNTER — Emergency Department: Payer: Medicare Other

## 2019-07-25 ENCOUNTER — Other Ambulatory Visit: Payer: Self-pay

## 2019-07-25 DIAGNOSIS — S7011XA Contusion of right thigh, initial encounter: Secondary | ICD-10-CM | POA: Diagnosis present

## 2019-07-25 DIAGNOSIS — R262 Difficulty in walking, not elsewhere classified: Secondary | ICD-10-CM

## 2019-07-25 DIAGNOSIS — E785 Hyperlipidemia, unspecified: Secondary | ICD-10-CM | POA: Diagnosis present

## 2019-07-25 DIAGNOSIS — S50311A Abrasion of right elbow, initial encounter: Secondary | ICD-10-CM | POA: Diagnosis present

## 2019-07-25 DIAGNOSIS — Z79899 Other long term (current) drug therapy: Secondary | ICD-10-CM

## 2019-07-25 DIAGNOSIS — E860 Dehydration: Secondary | ICD-10-CM

## 2019-07-25 DIAGNOSIS — Z881 Allergy status to other antibiotic agents status: Secondary | ICD-10-CM

## 2019-07-25 DIAGNOSIS — M8580 Other specified disorders of bone density and structure, unspecified site: Secondary | ICD-10-CM | POA: Diagnosis present

## 2019-07-25 DIAGNOSIS — S32591A Other specified fracture of right pubis, initial encounter for closed fracture: Secondary | ICD-10-CM

## 2019-07-25 DIAGNOSIS — S7001XA Contusion of right hip, initial encounter: Secondary | ICD-10-CM | POA: Diagnosis present

## 2019-07-25 DIAGNOSIS — I10 Essential (primary) hypertension: Secondary | ICD-10-CM

## 2019-07-25 DIAGNOSIS — Z20822 Contact with and (suspected) exposure to covid-19: Secondary | ICD-10-CM | POA: Diagnosis present

## 2019-07-25 DIAGNOSIS — B9620 Unspecified Escherichia coli [E. coli] as the cause of diseases classified elsewhere: Secondary | ICD-10-CM | POA: Diagnosis present

## 2019-07-25 DIAGNOSIS — R778 Other specified abnormalities of plasma proteins: Secondary | ICD-10-CM

## 2019-07-25 DIAGNOSIS — S32509A Unspecified fracture of unspecified pubis, initial encounter for closed fracture: Secondary | ICD-10-CM

## 2019-07-25 DIAGNOSIS — N390 Urinary tract infection, site not specified: Secondary | ICD-10-CM

## 2019-07-25 DIAGNOSIS — W010XXA Fall on same level from slipping, tripping and stumbling without subsequent striking against object, initial encounter: Secondary | ICD-10-CM | POA: Diagnosis present

## 2019-07-25 DIAGNOSIS — F0390 Unspecified dementia without behavioral disturbance: Secondary | ICD-10-CM | POA: Diagnosis present

## 2019-07-25 DIAGNOSIS — S3282XA Multiple fractures of pelvis without disruption of pelvic ring, initial encounter for closed fracture: Secondary | ICD-10-CM

## 2019-07-25 DIAGNOSIS — W19XXXA Unspecified fall, initial encounter: Secondary | ICD-10-CM

## 2019-07-25 DIAGNOSIS — R52 Pain, unspecified: Secondary | ICD-10-CM

## 2019-07-25 DIAGNOSIS — Z602 Problems related to living alone: Secondary | ICD-10-CM | POA: Diagnosis present

## 2019-07-25 DIAGNOSIS — Z87891 Personal history of nicotine dependence: Secondary | ICD-10-CM

## 2019-07-25 NOTE — ED Provider Notes (Signed)
ER Provider Note       Time seen: 9:52 PM    I have reviewed the vital signs and the nursing notes.  HISTORY   Chief Complaint Fall    HPI [name] [name] is a 86 y.o. female with a history of anxiety, skin cancer, diverticular disease, hypertension, hyperlipidemia who presents today for a fall.  Patient states she had an unwitnessed fall at home.  She is not sure why she fell.  She was found on the floor of her kitchen laying on her left side.  Complains of right hip pain and was noted to have shortening of the right leg.  Reportedly had the right hip replaced in the past.  She received fentanyl in route which helped her pain.  Past Medical History:  Diagnosis Date  . Anxiety   . Cancer ([place])    Squamous Cell Carcinoma (top of head)  . Constipation, chronic   . Deaf, left   . Diverticular disease   . Hearing aid worn   . Hearing deficit   . History of Bell's palsy    left side - resolved - 50 yrs ago  . Hypertension   . Internal hemorrhoids   . Osteopenia   . Pure hypercholesterolemia     Past Surgical History:  Procedure Laterality Date  . CATARACT EXTRACTION W/PHACO Left 11/16/2015   Procedure: CATARACT EXTRACTION PHACO AND INTRAOCULAR LENS PLACEMENT (IOC);  Surgeon: Leandrew Koyanagi, MD;  Location: [place];  Service: Ophthalmology;  Laterality: Left;  TORIC  . CATARACT EXTRACTION W/PHACO Right 12/07/2015   Procedure: CATARACT EXTRACTION PHACO AND INTRAOCULAR LENS PLACEMENT (IOC);  Surgeon: Leandrew Koyanagi, MD;  Location: [place];  Service: Ophthalmology;  Laterality: Right;  TORIC  . COLONOSCOPY WITH PROPOFOL N/A 08/10/2014   Procedure: COLONOSCOPY WITH PROPOFOL;  Surgeon: Lollie Sails, MD;  Location: [facility] ENDOSCOPY;  Service: Endoscopy;  Laterality: N/A;  . FEMUR FRACTURE SURGERY    . Nasal Polyp Removal      Allergies Bactrim [sulfamethoxazole-trimethoprim] and Ciprofloxacin   Review of Systems Constitutional:  Negative for fever. Cardiovascular: Negative for chest pain. Respiratory: Negative for shortness of breath. Gastrointestinal: Negative for abdominal pain, vomiting and diarrhea. Musculoskeletal: Positive for right hip pain Skin: Negative for rash. Neurological: Negative for headaches, focal weakness or numbness.  All systems negative/normal/unremarkable except as stated in the HPI  ____________________________________________   PHYSICAL EXAM:  VITAL SIGNS: Vitals:   07/25/19 2148  BP: (!) 145/74  Pulse: (!) 105  Resp: 20  Temp: 98.4 F (36.9 C)  SpO2: 98%    Constitutional: Alert and oriented. Well appearing and in no distress. Eyes: Conjunctivae are normal. Normal extraocular movements. ENT      Head: Normocephalic and atraumatic.      Nose: No congestion/rhinnorhea.      Mouth/Throat: Mucous membranes are moist.      Neck: No stridor. Cardiovascular: Normal rate, regular rhythm. No murmurs, rubs, or gallops. Respiratory: Normal respiratory effort without tachypnea nor retractions. Breath sounds are clear and equal bilaterally. No wheezes/rales/rhonchi. Gastrointestinal: Soft and nontender. Normal bowel sounds Musculoskeletal: Pain with range of motion of the right hip with some focal tenderness and around the right hip and proximal femur. Neurologic:  Normal speech and language. No gross focal neurologic deficits are appreciated.  Skin:  Skin is warm, dry and intact. No rash noted. Psychiatric: Speech and behavior are normal.  ____________________________________________  EKG: Interpreted by me.  Sinus rhythm with PACs, rate is 100 bpm, normal  axis, normal QT  ____________________________________________   LABS (pertinent positives/negatives)  Labs Reviewed  SARS CORONAVIRUS 2 BY RT PCR (HOSPITAL ORDER, [place] [facility])  BASIC METABOLIC PANEL  CBC  URINALYSIS, COMPLETE (UACMP) WITH MICROSCOPIC  TROPONIN I (HIGH SENSITIVITY)  TROPONIN I  (HIGH SENSITIVITY)    RADIOLOGY  Images were viewed by me Right hip x-rays IMPRESSION: Right inferior pubic and pubic bone fractures.  DIFFERENTIAL DIAGNOSIS  Fall, fracture, contusion, dehydration, electrolyte abnormality  ASSESSMENT AND PLAN  Fall, pelvic fractures   Plan: The patient had presented for a fall. Patient's labs are still pending, anticipate need for admission due to difficulty walking from acute pelvic fractures as noted above.   Lenise Arena MD    Note: This note was generated in part or whole with voice recognition software. Voice recognition is usually quite accurate but there are transcription errors that can and very often do occur. I apologize for any typographical errors that were not detected and corrected.     Earleen Newport, MD 07/25/19 [phone]

## 2019-07-25 NOTE — ED Triage Notes (Signed)
Patient coming [facility] from home for unwitnessed fall. Patient denies mechanical fall, states "I just fell". Patient seen in this ED Thursday for fall. Patient found on floor of kitchen, laying on left side. Patient c/o right hip pain, some shortening to right leg. Patient has hx of right femur fracture. Patient prior to fall had been walking with a walker; patient now unable to bear weight or tolerate movement of leg/knee post fall.   Patient given 100 mcg of fentanyl in transport.  EMS vitals:  CBG 167, HR 102 sinus tach with occasional PVCs, RR16, BP 154/95.   Patient has hx of hypertension.

## 2019-07-26 ENCOUNTER — Emergency Department: Payer: Medicare Other

## 2019-07-26 ENCOUNTER — Inpatient Hospital Stay: Payer: Medicare Other

## 2019-07-26 ENCOUNTER — Encounter: Payer: Self-pay | Admitting: Radiology

## 2019-07-26 DIAGNOSIS — Z881 Allergy status to other antibiotic agents status: Secondary | ICD-10-CM | POA: Diagnosis not present

## 2019-07-26 DIAGNOSIS — Z20822 Contact with and (suspected) exposure to covid-19: Secondary | ICD-10-CM | POA: Diagnosis present

## 2019-07-26 DIAGNOSIS — I10 Essential (primary) hypertension: Secondary | ICD-10-CM | POA: Diagnosis present

## 2019-07-26 DIAGNOSIS — E860 Dehydration: Secondary | ICD-10-CM | POA: Diagnosis present

## 2019-07-26 DIAGNOSIS — M8580 Other specified disorders of bone density and structure, unspecified site: Secondary | ICD-10-CM | POA: Diagnosis present

## 2019-07-26 DIAGNOSIS — F0390 Unspecified dementia without behavioral disturbance: Secondary | ICD-10-CM | POA: Diagnosis present

## 2019-07-26 DIAGNOSIS — W19XXXA Unspecified fall, initial encounter: Secondary | ICD-10-CM | POA: Diagnosis not present

## 2019-07-26 DIAGNOSIS — S32591A Other specified fracture of right pubis, initial encounter for closed fracture: Principal | ICD-10-CM

## 2019-07-26 DIAGNOSIS — R262 Difficulty in walking, not elsewhere classified: Secondary | ICD-10-CM

## 2019-07-26 DIAGNOSIS — S50311A Abrasion of right elbow, initial encounter: Secondary | ICD-10-CM | POA: Diagnosis present

## 2019-07-26 DIAGNOSIS — Z602 Problems related to living alone: Secondary | ICD-10-CM | POA: Diagnosis present

## 2019-07-26 DIAGNOSIS — Z79899 Other long term (current) drug therapy: Secondary | ICD-10-CM | POA: Diagnosis not present

## 2019-07-26 DIAGNOSIS — N390 Urinary tract infection, site not specified: Secondary | ICD-10-CM | POA: Diagnosis present

## 2019-07-26 DIAGNOSIS — E785 Hyperlipidemia, unspecified: Secondary | ICD-10-CM | POA: Diagnosis present

## 2019-07-26 DIAGNOSIS — Z87891 Personal history of nicotine dependence: Secondary | ICD-10-CM | POA: Diagnosis not present

## 2019-07-26 DIAGNOSIS — S7011XA Contusion of right thigh, initial encounter: Secondary | ICD-10-CM | POA: Diagnosis present

## 2019-07-26 DIAGNOSIS — S32509A Unspecified fracture of unspecified pubis, initial encounter for closed fracture: Secondary | ICD-10-CM | POA: Diagnosis present

## 2019-07-26 DIAGNOSIS — B9620 Unspecified Escherichia coli [E. coli] as the cause of diseases classified elsewhere: Secondary | ICD-10-CM | POA: Diagnosis present

## 2019-07-26 DIAGNOSIS — S7001XA Contusion of right hip, initial encounter: Secondary | ICD-10-CM | POA: Diagnosis present

## 2019-07-26 DIAGNOSIS — W010XXA Fall on same level from slipping, tripping and stumbling without subsequent striking against object, initial encounter: Secondary | ICD-10-CM | POA: Diagnosis present

## 2019-07-26 LAB — URINALYSIS, COMPLETE (UACMP) WITH MICROSCOPIC
Bilirubin Urine: NEGATIVE
Glucose, UA: NEGATIVE mg/dL
Hgb urine dipstick: NEGATIVE
Ketones, ur: 20 mg/dL — AB
Nitrite: POSITIVE — AB
Protein, ur: NEGATIVE mg/dL
Specific Gravity, Urine: 1.011 (ref 1.005–1.030)
pH: 6 (ref 5.0–8.0)

## 2019-07-26 LAB — CBC
HCT: 38.3 % (ref 36.0–46.0)
Hemoglobin: 13.1 g/dL (ref 12.0–15.0)
MCH: 30.8 pg (ref 26.0–34.0)
MCHC: 34.2 g/dL (ref 30.0–36.0)
MCV: 89.9 fL (ref 80.0–100.0)
Platelets: 184 10*3/uL (ref 150–400)
RBC: 4.26 MIL/uL (ref 3.87–5.11)
RDW: 13.5 % (ref 11.5–15.5)
WBC: 12.8 10*3/uL — ABNORMAL HIGH (ref 4.0–10.5)
nRBC: 0 % (ref 0.0–0.2)

## 2019-07-26 LAB — BASIC METABOLIC PANEL
Anion gap: 11 (ref 5–15)
BUN: 19 mg/dL (ref 8–23)
CO2: 24 mmol/L (ref 22–32)
Calcium: 9.1 mg/dL (ref 8.9–10.3)
Chloride: 101 mmol/L (ref 98–111)
Creatinine, Ser: 0.62 mg/dL (ref 0.44–1.00)
GFR calc Af Amer: >60 mL/min (ref >60)
GFR calc non Af Amer: >60 mL/min (ref >60)
Glucose, Bld: 141 mg/dL — ABNORMAL HIGH (ref 70–99)
Potassium: 3.6 mmol/L (ref 3.5–5.1)
Sodium: 136 mmol/L (ref 135–145)

## 2019-07-26 LAB — TROPONIN I (HIGH SENSITIVITY)
Troponin I (High Sensitivity): 17 ng/L (ref <18)
Troponin I (High Sensitivity): 18 ng/L — ABNORMAL HIGH (ref <18)

## 2019-07-26 LAB — SARS CORONAVIRUS 2 BY RT PCR (HOSPITAL ORDER, PERFORMED IN ~~LOC~~ HOSPITAL LAB): SARS Coronavirus 2: NEGATIVE

## 2019-07-26 MED ORDER — ACETAMINOPHEN 325 MG PO TABS
650.0000 mg | ORAL_TABLET | Freq: Four times a day (QID) | ORAL | Status: DC
  Administered 2019-07-27 – 2019-07-28 (×5): 650 mg via ORAL
  Filled 2019-07-26 (×7): qty 2

## 2019-07-26 MED ORDER — ONDANSETRON HCL 4 MG PO TABS: 4.0000 mg | ORAL_TABLET | Freq: Four times a day (QID) | ORAL | Status: DC | PRN

## 2019-07-26 MED ORDER — MORPHINE SULFATE (PF) 2 MG/ML IV SOLN: 1.0000 mg | INTRAVENOUS | Status: DC | PRN

## 2019-07-26 MED ORDER — SODIUM CHLORIDE 0.9 % IV SOLN
1.0000 g | INTRAVENOUS | Status: DC
  Filled 2019-07-26: qty 10

## 2019-07-26 MED ORDER — IBUPROFEN 400 MG PO TABS
400.0000 mg | ORAL_TABLET | Freq: Three times a day (TID) | ORAL | Status: DC
  Administered 2019-07-26 – 2019-07-28 (×7): 400 mg via ORAL
  Filled 2019-07-26 (×7): qty 1

## 2019-07-26 MED ORDER — SODIUM CHLORIDE 0.9 % IV BOLUS
1000.0000 mL | Freq: Once | INTRAVENOUS | Status: AC
  Administered 2019-07-26: 1000 mL via INTRAVENOUS

## 2019-07-26 MED ORDER — ACETAMINOPHEN 325 MG PO TABS: 650.0000 mg | ORAL_TABLET | Freq: Four times a day (QID) | ORAL | Status: DC | PRN

## 2019-07-26 MED ORDER — SODIUM CHLORIDE 0.9 % IV SOLN: INTRAVENOUS | Status: AC

## 2019-07-26 MED ORDER — HYDROCODONE-ACETAMINOPHEN 5-325 MG PO TABS
1.0000 | ORAL_TABLET | ORAL | Status: DC | PRN
  Administered 2019-07-26: 2 via ORAL
  Administered 2019-07-26: 1 via ORAL
  Filled 2019-07-26: qty 2
  Filled 2019-07-26: qty 1

## 2019-07-26 MED ORDER — HYDROCODONE-ACETAMINOPHEN 5-325 MG PO TABS: 1.0000 | ORAL_TABLET | ORAL | Status: DC | PRN

## 2019-07-26 MED ORDER — FAMOTIDINE 20 MG PO TABS
20.0000 mg | ORAL_TABLET | Freq: Every day | ORAL | Status: DC
  Administered 2019-07-26 – 2019-07-28 (×3): 20 mg via ORAL
  Filled 2019-07-26 (×3): qty 1

## 2019-07-26 MED ORDER — ENOXAPARIN SODIUM 40 MG/0.4ML ~~LOC~~ SOLN
40.0000 mg | SUBCUTANEOUS | Status: DC
  Administered 2019-07-26 – 2019-07-28 (×3): 40 mg via SUBCUTANEOUS
  Filled 2019-07-26 (×3): qty 0.4

## 2019-07-26 MED ORDER — ONDANSETRON HCL 4 MG/2ML IJ SOLN: 4.0000 mg | Freq: Four times a day (QID) | INTRAMUSCULAR | Status: DC | PRN

## 2019-07-26 MED ORDER — SODIUM CHLORIDE 0.9 % IV SOLN
1.0000 g | Freq: Once | INTRAVENOUS | Status: AC
  Administered 2019-07-26: 1 g via INTRAVENOUS
  Filled 2019-07-26: qty 10

## 2019-07-26 MED ORDER — ACETAMINOPHEN 650 MG RE SUPP: 650.0000 mg | Freq: Four times a day (QID) | RECTAL | Status: DC | PRN

## 2019-07-26 NOTE — ED Notes (Signed)
Patient's pants, panties, bra, and hearing aids placed in belonging's bag and sent to floor with patient by this RN; witnessed by [place] NT

## 2019-07-26 NOTE — Progress Notes (Addendum)
PROGRESS NOTE    [name] [name]  FKC:[id number] DOB: 24-Dec-1929 DOA: 07/25/2019 PCP: [name], MD    Brief Narrative:  Patient admitted to the hospital with right inferior pubic ramus fracture.  86 year old female with past medical history for hypertension, dyslipidemia and anxiety who presents after a mechanical fall.  Patient was found down on the kitchen floor, she had an unwitnessed fall, she did not recall the mechanism of the fall but denied any loss of consciousness.  On her initial physical examination blood pressure 133/49, heart rate 92, respiratory 16, oxygen saturation 94%.  Her lungs are clear to auscultation bilaterally, heart S1-S2, present rhythmic, soft abdomen, no lower extremity edema. Sodium 136, potassium 3.6, chloride 101, bicarb 24, glucose 141, BUN 19, creatinine 0.62, white count 12.8, hemoglobin 13.1, hematocrit 38.3, platelets 184.  SARS COVID-19 negative.  Urinalysis 0-5 white cells, 0-5 red cells, specific gravity 1.011, positive nitrates.  Her chest radiograph had increased lung markings bilaterally, hyperinflation, no infiltrates. Right hip films with right inferior pubic and pubic bone fractures.  EKG 100 bpm, normal axis, normal intervals, sinus rhythm, no ST segment or T wave changes.    Assessment & Plan:   Principal Problem:   Closed fracture of right inferior pubic ramus (HCC) Active Problems:   Accidental fall   HTN (hypertension)   Ambulatory dysfunction   1. Acute right pubic ramus fracture. Patient continue to have significant pain on her right lower extremity, not able to ambulate, not back to her baseline. Her pain is persistent despite oral analgesics.  Will add IV morphine as needed will change acetaminophen to scheduled dosing every 6 H, add ibuprofen 400 mg scheduled TID and continue with as needed hydrocodone.   If persistent pain may need to add long acting opiate.   I spoke with Dr. Posey Pronto from orthopedics, patient ok for  weight bearing as tolerated if femur films without fracture. Will check femur films before placing the order for weight bearing.   2. HTN. Will continue blood pressure control with  3. Positive nitrates. Patient with no pyuria and no urinary symptoms, will hold on antibiotic therapy for now. Follow on urine culture.     Status is: Observation  The patient will require care spanning > 2 midnights and should be moved to inpatient because: Unsafe d/c plan, patient lives by herself, not able to ambulate due to pain and weakness. Acute pelvic fracture. Symptoms have not improved with initial analgesic regimen of oral hydrocodone and as needed acetaminophen.   Dispo: The patient is from: Home              Anticipated d/c is to: SNF              Anticipated d/c date is: 3 days              Patient currently is not medically stable to d/c.   DVT prophylaxis: Enoxaparin   Code Status:   full  Family Communication:  No family at the bedside       Subjective: Patient continue to have significant hip pain on the right, she can not move her right leg. No dyspnea or chest pain.   Objective: Vitals:   07/26/19 0400 07/26/19 0512 07/26/19 0922 07/26/19 1354  BP: (!) 124/56 138/74 (!) 124/56 113/62  Pulse: 79 84 82 79  Resp: 18 19 16 16   Temp:  98.7 F (37.1 C) 98.2 F (36.8 C) 98.1 F (36.7 C)  TempSrc:  Oral Oral  Oral  SpO2: 92% 99% 95% 92%  Weight:  55.7 kg    Height:  5\' 3"  (1.6 m)      Intake/Output Summary (Last 24 hours) at 07/26/2019 1358 Last data filed at 07/26/2019 1333 Gross per 24 hour  Intake 508.17 ml  Output 0 ml  Net 508.17 ml   Filed Weights   07/25/19 2156 07/26/19 0512  Weight: 56.7 kg 55.7 kg    Examination:   General: Not in pain or dyspnea, deconditioned  Neurology: Awake and alert, non focal, decreased hearing  E ENT: no pallor, no icterus, oral mucosa moist Cardiovascular: No JVD. S1-S2 present, rhythmic, no gallops, rubs, or murmurs. No lower  extremity edema. Pulmonary: positive breath sounds bilaterally, adequate air movement, no wheezing, rhonchi or rales. Gastrointestinal. Abdomen with no organomegaly, non tender, no rebound or guarding Skin. No rashes Musculoskeletal: right lower extremity pain, limited mobility, she can't elevated her right leg against gravity.      Data Reviewed: I have personally reviewed following labs and imaging studies  CBC: Recent Labs  Lab 07/26/19 0001  WBC 12.8*  HGB 13.1  HCT 38.3  MCV 89.9  PLT 941   Basic Metabolic Panel: Recent Labs  Lab 07/26/19 0001  NA 136  K 3.6  CL 101  CO2 24  GLUCOSE 141*  BUN 19  CREATININE 0.62  CALCIUM 9.1   GFR: Estimated Creatinine Clearance: 40.2 mL/min (by C-G formula based on SCr of 0.62 mg/dL). Liver Function Tests: No results for input(s): AST, ALT, ALKPHOS, BILITOT, PROT, ALBUMIN in the last 168 hours. No results for input(s): LIPASE, AMYLASE in the last 168 hours. No results for input(s): AMMONIA in the last 168 hours. Coagulation Profile: No results for input(s): INR, PROTIME in the last 168 hours. Cardiac Enzymes: No results for input(s): CKTOTAL, CKMB, CKMBINDEX, TROPONINI in the last 168 hours. BNP (last 3 results) No results for input(s): PROBNP in the last 8760 hours. HbA1C: No results for input(s): HGBA1C in the last 72 hours. CBG: No results for input(s): GLUCAP in the last 168 hours. Lipid Profile: No results for input(s): CHOL, HDL, LDLCALC, TRIG, CHOLHDL, LDLDIRECT in the last 72 hours. Thyroid Function Tests: No results for input(s): TSH, T4TOTAL, FREET4, T3FREE, THYROIDAB in the last 72 hours. Anemia Panel: No results for input(s): VITAMINB12, FOLATE, FERRITIN, TIBC, IRON, RETICCTPCT in the last 72 hours.    Radiology Studies: I have reviewed all of the imaging during this hospital visit personally     Scheduled Meds: . enoxaparin (LOVENOX) injection  40 mg Subcutaneous Q24H   Continuous Infusions: .  sodium chloride 75 mL/hr at 07/26/19 0509  . cefTRIAXone (ROCEPHIN)  IV       LOS: 0 days        Vertie Dibbern Gerome Apley, MD

## 2019-07-26 NOTE — Progress Notes (Signed)
PT Cancellation Note  Patient Details Name: [name] [name] MRN: [id number] DOB: [birth year]   Cancelled Treatment:    Reason Eval/Treat Not Completed: Medical issues which prohibited therapy Discussed case with RN and attending MD, patient has a pubic ramus fx, will have an ortho consultation. PT will hold until [facility] recommendations are provided.     Royce Macadamia PT, DPT, CSCS    07/26/2019, 3:00 PM

## 2019-07-26 NOTE — Progress Notes (Signed)
   07/26/19 0700  Charting Type  Charting Type Shift assessment  Orders Chart Check (once per shift) Completed  [place] Work Intensity Score (Update with each assessment and as needed)  Work Intensity Score (Level) 2  Neurological  Neuro (WDL) WDL  NuDESC - Delirium Risk Factor Assessment (Complete for non-ICU patients)  Delirium Risk Factor Assessment Age greater than or equal to 2 years  NuDESC - Nursing Delirium Screening Scale (Complete for non-ICU patients)  Disorientation 0  Inappropriate Behavior 0  Inappropriate Communications 0  Illusions/hallucinations 0  Psychomotor Retardation 0  NuDESC Total Score 0  NuDESC - Delirium Prevention:  Universal Requirements (Complete for all non-ICU patients with a delirium risk factor)  Universal Precautions Initiated *See Row Information* Yes  HEENT  HEENT (WDL) WDL  Vision Check No  Respiratory  Respiratory (WDL) WDL  Cardiac  Cardiac (WDL) X  Pulse Regular  Heart Sounds S1, S2  Jugular Venous Distention (JVD) No  ECG Monitor Yes  Antiarrhythmic device  Antiarrhythmic device No  Vascular  Vascular (WDL) WDL  Integumentary  Integumentary (WDL) X  Skin Color Appropriate for ethnicity  Skin Condition Dry  Skin Integrity Skin tear  Skin Tear Location Elbow  Skin Tear Location Orientation Right  Skin Tear Intervention Foam;Thin film  Skin Turgor Non-tenting  Sacral Foam Prophylactic Dressing  Dressing Interventions Skin assessed under dressing  Medical Device Prophylactic Dressing  Skin Assessed Under All Medical Device Prophylactic Dressings (if applicable) Done - Skin Intact  Braden Scale (Ages 8 and up)  Sensory Perceptions 3  Moisture 3  Activity 3  Mobility 3  Nutrition 3  Friction and Shear 3  Braden Scale Score 18  Braden Interventions  Braden Scale Interventions Skin care;SPH  Musculoskeletal  Musculoskeletal (WDL) X  Assistive Device BSC;Front wheel walker  Generalized Weakness Yes  Weight Bearing  Restrictions No  Musculoskeletal Details  RLE Limited movement  Gastrointestinal  Gastrointestinal (WDL) WDL  GU Assessment  Genitourinary (WDL) X  Genitourinary Symptoms External catheter  Genitalia  Female Genitalia Intact  External Urinary Catheter  Placement Date/Time: 07/25/19 2356   Person Inserting Catheter: Eliezer Lofts RN, witnessed by Caryl Pina NT  External Urinary Catheter Type: Female  Collection Container Dedicated Suction Canister  Securement Method Tape  Site Assessment Clean;Intact  Psychosocial  Psychosocial (WDL) WDL  Neurological  Level of Consciousness Alert

## 2019-07-26 NOTE — ED Notes (Signed)
This RN updated patient's son on current plan of care. Provided patient with phone; patient spoke with son.

## 2019-07-26 NOTE — H&P (Signed)
History and Physical    [name] [name] HDQ:[id number] DOB: 1929-07-27 DOA: 07/25/2019  PCP: [name], MD   Patient coming from: Home  I have personally briefly reviewed patient's old medical records in [place]  Chief Complaint: Fall  HPI: [name] [name] is a 86 y.o. female with medical history significant for HTN, HLD, anxiety who presented to the emergency room following an unwitnessed fall at home where she lives alone.  She does not know why she fell.  Denies preceding lightheadedness, dizziness, palpitations, headache or blurred vision one-sided numbness weakness tingling, chest pain or palpitations or shortness of breath.  She fell onto her right side and is unable to bear weight since the fall.  EMS found her lying on the floor of her kitchen.  She had no loss of consciousness and did not hit her head ED Course: On arrival she was mildly tachycardic at 105 but with otherwise normal vitals.  She was awake and alert.  EKG showed sinus rhythm with some PACs.  Blood work showed WBC of 12,800 but was for the most part unremarkable.  Troponin slightly elevated at 18 with flat trend to 17.  Chest x-ray showed no acute enteral thoracic process.  Hip x-ray showing right pubic ramus fracture.  Given that patient lives alone and unable to bear weight, hospitalization requested for pain control and safe discharge options Review of Systems: As per HPI otherwise all other systems on review of systems negative.    Past Medical History:  Diagnosis Date  . Anxiety   . Cancer ([place])    Squamous Cell Carcinoma (top of head)  . Constipation, chronic   . Deaf, left   . Diverticular disease   . Hearing aid worn   . Hearing deficit   . History of Bell's palsy    left side - resolved - 50 yrs ago  . Hypertension   . Internal hemorrhoids   . Osteopenia   . Pure hypercholesterolemia     Past Surgical History:  Procedure Laterality Date  . CATARACT EXTRACTION W/PHACO Left  11/16/2015   Procedure: CATARACT EXTRACTION PHACO AND INTRAOCULAR LENS PLACEMENT (IOC);  Surgeon: Leandrew Koyanagi, MD;  Location: [facility];  Service: Ophthalmology;  Laterality: Left;  TORIC  . CATARACT EXTRACTION W/PHACO Right 12/07/2015   Procedure: CATARACT EXTRACTION PHACO AND INTRAOCULAR LENS PLACEMENT (IOC);  Surgeon: Leandrew Koyanagi, MD;  Location: [facility];  Service: Ophthalmology;  Laterality: Right;  TORIC  . COLONOSCOPY WITH PROPOFOL N/A 08/10/2014   Procedure: COLONOSCOPY WITH PROPOFOL;  Surgeon: Lollie Sails, MD;  Location: [facility] ENDOSCOPY;  Service: Endoscopy;  Laterality: N/A;  . FEMUR FRACTURE SURGERY    . Nasal Polyp Removal       reports that she quit smoking about 33 years ago. She has never used smokeless tobacco. She reports that she does not drink alcohol and does not use drugs.  Allergies  Allergen Reactions  . Bactrim [Sulfamethoxazole-Trimethoprim] Other (See Comments)    Hearing loss  . Ciprofloxacin Other (See Comments)    Flu-like symptoms  . Pneumococcal Vaccines Other (See Comments)    Flu-like symptoms    History reviewed. No pertinent family history.    Prior to Admission medications   Medication Sig Start Date End Date Taking? Authorizing Provider  ascorbic acid (VITAMIN C) 1000 MG tablet Take 1,000 mg by mouth daily.   Yes [provider]  b complex vitamins capsule Take 1 capsule by mouth daily.   Yes [provider]  beta carotene 25000 UNIT capsule Take 25,000 Units by mouth daily.   Yes [provider]  Biotin 5 MG CAPS Take 5 mg by mouth.   Yes [provider]  calcium carbonate 1250 MG capsule Take 1,250 mg by mouth daily.   Yes [provider]  fluticasone (FLONASE) 50 MCG/ACT nasal spray Place 2 sprays into both nostrils daily as needed for rhinitis.   Yes [provider]  folic acid (FOLVITE) 166 MCG tablet Take 800 mcg by mouth daily.   Yes [provider]  GARLIC PO Take 063 mg by mouth daily.    Yes [provider]  Glucosamine HCl (GLUCOSAMINE PO) Take 1 Dose by mouth daily.   Yes [provider]  hydrochlorothiazide (HYDRODIURIL) 25 MG tablet Take 25 mg by mouth daily.   Yes [provider]  lidocaine (LIDODERM) 5 % Place 1 patch onto the skin every 12 (twelve) hours. Remove & Discard patch within 12 hours or as directed by MD 07/23/19 07/22/20 Yes Sable Feil, PA-C  lovastatin (MEVACOR) 40 MG tablet Take 40 mg by mouth at bedtime.   Yes [provider]  Multiple Vitamin (MULTIVITAMIN) tablet Take 1 tablet by mouth daily.   Yes [provider]  Multiple Vitamins-Minerals (HAIR/SKIN/NAILS PO) Take 1 tablet by mouth daily.   Yes [provider]  polyethylene glycol (MIRALAX / GLYCOLAX) 17 g packet Take 17 g by mouth daily.    Yes [provider]  Selenium 100 MCG TABS Take 100 mcg by mouth daily.    Yes [provider]  traMADol (ULTRAM) 50 MG tablet Take 1 tablet (50 mg total) by mouth daily at 12 noon for 5 days. 07/23/19 07/28/19 Yes Sable Feil, PA-C  tretinoin (RETIN-A) 0.05 % cream Apply 1 Dose topically at bedtime.   Yes [provider]  verapamil (CALAN-SR) 180 MG CR tablet Take 180 mg by mouth daily.    Yes [provider]  vitamin E 400 UNIT capsule Take 400 Units by mouth daily.   Yes [provider]    Physical Exam: Vitals:   07/26/19 0000 07/26/19 0030 07/26/19 0100 07/26/19 0130  BP: (!) 133/49 (!) 124/56 (!) 117/59 (!) 122/59  Pulse: 92 92 91 88  Resp: 16 18 19 13   Temp:      TempSrc:      SpO2: 100% 93% 94% 96%  Weight:      Height:         Vitals:   07/26/19 0000 07/26/19 0030 07/26/19 0100 07/26/19 0130  BP: (!) 133/49 (!) 124/56 (!) 117/59 (!) 122/59  Pulse: 92 92 91 88  Resp: 16 18 19 13   Temp:      TempSrc:      SpO2: 100% 93% 94% 96%  Weight:      Height:          Constitutional:  Alert and oriented x 3 . Not in any apparent distress HEENT:      Head: Normocephalic and atraumatic.         Eyes: PERLA, EOMI, Conjunctivae are normal. Sclera is non-icteric.       Mouth/Throat: Mucous membranes are moist.       Ears: Hearing impaired      Neck: Supple with no signs of meningismus. Cardiovascular: Regular rate and rhythm. No murmurs, gallops, or rubs. 2+ symmetrical distal pulses are present . No JVD. No LE edema Respiratory: Respiratory effort normal .Lungs sounds  clear bilaterally. No wheezes, crackles, or rhonchi.  Gastrointestinal: Soft, non tender, and non distended with positive bowel sounds. No rebound or guarding. Genitourinary: No CVA tenderness. Musculoskeletal: Nontender with normal range of motion in all extremities. No cyanosis, or erythema of extremities. Neurologic: Normal speech and language. Face is symmetric. Moving all extremities. No gross focal neurologic deficits . Skin: Skin is warm, dry.  No rash or ulcers Psychiatric: Mood and affect are normal Speech and behavior are normal   Labs on Admission: I have personally reviewed following labs and imaging studies  CBC: Recent Labs  Lab 07/26/19 0001  WBC 12.8*  HGB 13.1  HCT 38.3  MCV 89.9  PLT 121   Basic Metabolic Panel: Recent Labs  Lab 07/26/19 0001  NA 136  K 3.6  CL 101  CO2 24  GLUCOSE 141*  BUN 19  CREATININE 0.62  CALCIUM 9.1   GFR: Estimated Creatinine Clearance: 40.2 mL/min (by C-G formula based on SCr of 0.62 mg/dL). Liver Function Tests: No results for input(s): AST, ALT, ALKPHOS, BILITOT, PROT, ALBUMIN in the last 168 hours. No results for input(s): LIPASE, AMYLASE in the last 168 hours. No results for input(s): AMMONIA in the last 168 hours. Coagulation Profile: No results for input(s): INR, PROTIME in the last 168 hours. Cardiac Enzymes: No results for input(s): CKTOTAL, CKMB, CKMBINDEX, TROPONINI in the last 168 hours. BNP (last 3 results) No results for  input(s): PROBNP in the last 8760 hours. HbA1C: No results for input(s): HGBA1C in the last 72 hours. CBG: No results for input(s): GLUCAP in the last 168 hours. Lipid Profile: No results for input(s): CHOL, HDL, LDLCALC, TRIG, CHOLHDL, LDLDIRECT in the last 72 hours. Thyroid Function Tests: No results for input(s): TSH, T4TOTAL, FREET4, T3FREE, THYROIDAB in the last 72 hours. Anemia Panel: No results for input(s): VITAMINB12, FOLATE, FERRITIN, TIBC, IRON, RETICCTPCT in the last 72 hours. Urine analysis:    Component Value Date/Time   COLORURINE YELLOW (A) 07/26/2019 0221   APPEARANCEUR HAZY (A) 07/26/2019 0221   APPEARANCEUR Cloudy 05/31/2012 2019   LABSPEC 1.011 07/26/2019 0221   LABSPEC 1.011 05/31/2012 2019   PHURINE 6.0 07/26/2019 0221   GLUCOSEU NEGATIVE 07/26/2019 0221   GLUCOSEU 50 mg/dL 05/31/2012 2019   HGBUR NEGATIVE 07/26/2019 0221   BILIRUBINUR NEGATIVE 07/26/2019 0221   BILIRUBINUR Negative 05/31/2012 2019   KETONESUR 20 (A) 07/26/2019 0221   PROTEINUR NEGATIVE 07/26/2019 0221   NITRITE POSITIVE (A) 07/26/2019 0221   LEUKOCYTESUR TRACE (A) 07/26/2019 0221   LEUKOCYTESUR Negative 05/31/2012 2019    Radiological Exams on Admission: DG Chest Port 1 View  Result Date: 07/26/2019 CLINICAL DATA:  [place], found down, right hip pain EXAM: PORTABLE CHEST 1 VIEW COMPARISON:  05/31/2012 FINDINGS: Single frontal view of the chest demonstrates an unremarkable cardiac silhouette. No airspace disease, effusion, or pneumothorax. No acute bony abnormalities. IMPRESSION: 1. No acute intrathoracic process. Electronically Signed   By: Randa Ngo M.D.   On: 07/26/2019 00:29   DG Hip Unilat  With Pelvis 2-3 Views Right  Result Date: 07/25/2019 CLINICAL DATA:  Fall, right hip pain EXAM: DG HIP (WITH OR WITHOUT PELVIS) 2-3V RIGHT COMPARISON:  07/23/2019 FINDINGS: Hardware within the the proximal right femur related to remote injury. Mild degenerative changes in the hips. Fracture seen  in the right inferior pubic ramus, new since prior study. Also new, fracture through the right pubic bone near the pubic symphysis. IMPRESSION: Right inferior pubic and pubic bone fractures. Electronically Signed   By:  Rolm Baptise M.D.   On: 07/25/2019 22:22    EKG: Independently reviewed. Interpretation : sinus rhythm with some PACs  Assessment/Plan Principal Problem:  86 year old female with history of hypertension and who lives alone who presents with a fall sustaining pubic ramus fracture with inability to weight-bear.  UTI on work-up  Closed fracture of right inferior pubic ramus ([place])   Accidental fall   Ambulatory dysfunction -Fall appears to be accidental.  Probably related to weakness associated with UTI patient denying any symptoms to suggest a fall that is syncopal in nature.  She has no focal deficits -Physical therapy evaluation as patient unable to bear weight -X-ray as above showing right pubic ramus fracture -Pain control -Can consider Ortho consult in the a.m.    UTI (urinary tract infection) -Leukocytosis of 12,800 with abnormal UA -IV Rocephin -Follow cultures    HTN (hypertension) -Continue home meds    DVT prophylaxis: Lovenox  Code Status: full code  Family Communication:  none  Disposition Plan: Back to previous home environment Consults called: none  Status:obs    Athena Masse MD [facility]     07/26/2019, 3:42 AM

## 2019-07-26 NOTE — ED Notes (Signed)
Admitting MD at bedside to speak with patient.  

## 2019-07-26 NOTE — ED Provider Notes (Signed)
-----------------------------------------   12:00 AM on 07/26/2019 -----------------------------------------  Care assumed of patient.  In summary, this is a 86 year old female coming from home status post unwitnessed fall with pelvic fractures.  She is unable to ambulate in the emergency department.  Will obtain basic lab work, Covid swab and discuss with hospitalist services for admission.   ----------------------------------------- 3:08 AM on 07/26/2019 -----------------------------------------  UA positive for UTI and ketonuria.  Will start IV Rocephin.  Minimally elevated troponin which is likely a stress leak.  Will discuss with hospitalist services for admission.   Paulette Blanch, MD 07/26/19 [phone]

## 2019-07-26 NOTE — ED Notes (Signed)
Patient's neighbor Ventura Bruns asked to be called if patient needed a ride home.   [phone]

## 2019-07-27 DIAGNOSIS — N390 Urinary tract infection, site not specified: Secondary | ICD-10-CM

## 2019-07-27 DIAGNOSIS — E860 Dehydration: Secondary | ICD-10-CM

## 2019-07-27 LAB — SARS CORONAVIRUS 2 (TAT 6-24 HRS): SARS Coronavirus 2: NEGATIVE

## 2019-07-27 MED ORDER — VERAPAMIL HCL ER 180 MG PO TBCR
180.0000 mg | EXTENDED_RELEASE_TABLET | Freq: Every day | ORAL | Status: DC
  Administered 2019-07-27 – 2019-07-28 (×2): 180 mg via ORAL
  Filled 2019-07-27 (×2): qty 1

## 2019-07-27 MED ORDER — SODIUM CHLORIDE 0.9 % IV SOLN
1.0000 g | INTRAVENOUS | Status: DC
  Administered 2019-07-27: 1 g via INTRAVENOUS
  Filled 2019-07-27: qty 1
  Filled 2019-07-27: qty 10

## 2019-07-27 MED ORDER — HYDROCHLOROTHIAZIDE 25 MG PO TABS
25.0000 mg | ORAL_TABLET | Freq: Every day | ORAL | Status: DC
  Administered 2019-07-27 – 2019-07-28 (×2): 25 mg via ORAL
  Filled 2019-07-27 (×2): qty 1

## 2019-07-27 NOTE — Evaluation (Signed)
Physical Therapy Evaluation Patient Details Name: [name] [name] MRN: [id number] DOB: 02/21/1929 Today's Date: 07/27/2019   History of Present Illness  86 y/o female admitted after fall in kitchen. Pt with previous closed fx of R inferior pubic ramus. Chief complain is inability to bear weight on RLE since fall. PMH includes HTN, HLD, anxiety, squamous cell carconoma (top of head), deaf in L ear, and osteopenia. Per Dr. Posey Pronto from orthopedics, pt ok for WBAT.  Clinical Impression  Pt received lying in bed and agreeable to PT evaluation. Pt had hearing aides on bedside table however they need to be charged every night. Pt soiled of bowel in bed that went through bed pad and sheets, requiring linen change. Pt performed bed mobility with min A for RLE and trunk management. Mod-max A for transfers from various surfaces for boost into standing and for balance while upright. Pt had large BM in [facility] and was able to perform self care with min A for getting wipes and hand clean up. Pt ambulated 3 feet using RW, WBAT on RLE, initially with max A then mod A as she acclimated to upright position. Recommend continued skilled therapy to address strength, mobility, and pain deficits. At this time, recommend SNF at discharge from acute stay to maximize functional mobility and independence and return to PLOF. Pt left seated in recliner chair with nurse and nurses aides present.    Follow Up Recommendations SNF    Equipment Recommendations  3in1 (PT)    Recommendations for Other Services       Precautions / Restrictions Precautions Precautions: Fall Restrictions Weight Bearing Restrictions: Yes RLE Weight Bearing: Weight bearing as tolerated (Per Dr. Posey Pronto from orthopedics, pt ok for WBAT)      Mobility  Bed Mobility Overal bed mobility: Needs Assistance Bed Mobility: Supine to Sit     Supine to sit: Min assist     General bed mobility comments: min A for RLE management to edge of bed; min A  for trunk elevation into sitting; pt able to scoot hips towards edge of bed with SBA  Transfers Overall transfer level: Needs assistance Equipment used: Rolling walker (2 wheeled) Transfers: Sit to/from [facility] Sit to Stand: Mod assist Stand pivot transfers: Max assist       General transfer comment: max A for stand pivot transfer for balance and boost to standing from bed to [facility] for initial WBing; mod A for sit <> stand from [facility] for boost into standing and balance, verbal cues for utilizing BUE on arm rests  Ambulation/Gait Ambulation/Gait assistance: Max assist;Mod assist Gait Distance (Feet): 3 Feet Assistive device: Rolling walker (2 wheeled) Gait Pattern/deviations: Step-to pattern;Decreased weight shift to right Gait velocity: decreased   General Gait Details: pt ambulated 3 feet in reverse from [facility] to recliner chair; noted decreased stance time on RLE, increased verbal cues for sequencing and RW management; pt requires increased time to perform task with mod-max A for balance and safety  Stairs            Wheelchair Mobility    Modified Rankin (Stroke Patients Only)       Balance Overall balance assessment: Needs assistance Sitting-balance support: Feet supported Sitting balance-Leahy Scale: Good Sitting balance - Comments: pt able to lean outside of BOS and able to return during pericare on BSC with SBA   Standing balance support: Bilateral upper extremity supported Standing balance-Leahy Scale: Fair Standing balance comment: pt intially with posterior lean requiring max A; pt able  to improve standing balance to mod A                              Pertinent Vitals/Pain Pain Assessment: 0-10 Pain Score: 4  (with movement) Pain Location: R hip Pain Descriptors / Indicators: Aching;Discomfort Pain Intervention(s): Monitored during session;Limited activity within patient's tolerance;Repositioned    Home Living Family/patient  expects to be discharged to:: Private residence Living Arrangements: Alone Available Help at Discharge:  (no family local; son to visit but unable to stay) Type of Home: House Home Access: Stairs to enter Entrance Stairs-Rails: Can reach both Entrance Stairs-Number of Steps: 4 (has multi level home with second floor and basement) Home Layout: Multi-level;1/2 bath on main level;Bed/bath upstairs Home Equipment: Walker - 2 wheels;Cane - single point;Grab bars - tub/shower;Grab bars - toilet Additional Comments: pt reports that her bed is upstairs however does state that if necessary to sleep downstairs, she has a recliner chair that she may be able to use    Prior Function Level of Independence: Independent               Hand Dominance        Extremity/Trunk Assessment   Upper Extremity Assessment Upper Extremity Assessment: Generalized weakness (overall 4-/5)    Lower Extremity Assessment Lower Extremity Assessment: Generalized weakness;RLE deficits/detail (LLE 4/5 overall; RLE 3+/5 RLE) RLE: Unable to fully assess due to pain RLE Sensation: WNL       Communication   Communication: No difficulties  Cognition Arousal/Alertness: Awake/alert Behavior During Therapy: WFL for tasks assessed/performed Overall Cognitive Status: Within Functional Limits for tasks assessed                                        General Comments      Exercises Other Exercises Other Exercises: Pt performed AP, heel slides, and hip ab/add x 10 reps bilaterally; pt required CGA to initiale RLE movements however pt able to complete motion Other Exercises: Pt performed self care after having had BM in bed and on [facility]   Assessment/Plan    PT Assessment Patient needs continued PT services  PT Problem List Decreased strength;Decreased activity tolerance;Decreased balance;Decreased mobility;Pain       PT Treatment Interventions DME instruction;Gait training;Stair  training;Functional mobility training;Therapeutic activities;Therapeutic exercise;Balance training;Patient/family education    PT Goals (Current goals can be found in the Care Plan section)  Acute Rehab PT Goals Patient Stated Goal: to be able to do for myself PT Goal Formulation: With patient Time For Goal Achievement: 08/10/19 Potential to Achieve Goals: Good    Frequency 7X/week   Barriers to discharge Decreased caregiver support pt has no family locally to assist with care at home    Co-evaluation               AM-PAC PT "6 Clicks" Mobility  Outcome Measure Help needed turning from your back to your side while in a flat bed without using bedrails?: A Little Help needed moving from lying on your back to sitting on the side of a flat bed without using bedrails?: A Little Help needed moving to and from a bed to a chair (including a wheelchair)?: A Little Help needed standing up from a chair using your arms (e.g., wheelchair or bedside chair)?: A Lot Help needed to walk in hospital room?: A Lot Help needed climbing  3-5 steps with a railing? : A Lot 6 Click Score: 15    End of Session Equipment Utilized During Treatment: Gait belt Activity Tolerance: Patient limited by pain Patient left: in chair;with chair alarm set;with nursing/sitter in room Nurse Communication: Mobility status PT Visit Diagnosis: Unsteadiness on feet (R26.81);Muscle weakness (generalized) (M62.81);History of falling (Z91.81);Pain Pain - Right/Left: Right Pain - part of body: Hip    Time: 0920-1011 PT Time Calculation (min) (ACUTE ONLY): 51 min   Charges:   PT Evaluation $PT Eval Moderate Complexity: 1 Mod PT Treatments $Therapeutic Exercise: 8-22 mins $Therapeutic Activity: 8-22 mins        Vale Haven, SPT  Vasily Fedewa 07/27/2019, 1:34 PM

## 2019-07-27 NOTE — Plan of Care (Signed)

## 2019-07-27 NOTE — Progress Notes (Signed)
PROGRESS NOTE    [name] [name]  LGX:[id number] DOB: [birth year] DOA: 07/25/2019 PCP: [name], MD   Brief Narrative:  86 year old female with past medical history for hypertension, dyslipidemia and anxiety who presents after a mechanical fall.  Patient was found down on the kitchen floor, she had an unwitnessed fall, she did not recall the mechanism of the fall but denied any loss of consciousness. Right hip films with right inferior pubic and pubic bone fractures.   PDX was consulted, no consult notes yet.  Per hospitalist progress notes patient will be weightbearing as there was no femoral fracture. PT/OT are recommending SNF placement  Subjective: Patient was sitting in chair when seen today.  Denies any pain.  She is very hard of hearing.  Tried asking about urinary symptoms but she was unable to answer properly.  Assessment & Plan:   Principal Problem:   Closed fracture of right inferior pubic ramus (HCC) Active Problems:   Accidental fall   HTN (hypertension)   Ambulatory dysfunction   Pubic bone fracture (HCC)  Acute right pubic ramus fracture.  Can [place] to mechanical fall. -PT/OT recommending SNF placement. -TOC is working on it. -Continue pain Mudlogger.  UTI.  UA with positive nitrates but no pyuria.  Unable to assess urinary symptoms as patient is very hard of hearing and most likely having underlying dementia.  Urine culture growing E. Coli. Patient remained afebrile with leukocytosis. -Restart ceftriaxone-we will de-escalate once susceptibility available.  Hypertension. -Start home dose of verapamil and HCTZ.  Objective: Vitals:   07/26/19 1354 07/26/19 1726 07/27/19 0012 07/27/19 0732  BP: 113/62 (!) 127/57 (!) 143/65 (!) 159/76  Pulse: 79 86 82 86  Resp: 16 16 16 19   Temp: 98.1 F (36.7 C) 97.9 F (36.6 C) 98.6 F (37 C) 98.2 F (36.8 C)  TempSrc: Oral Oral  Oral  SpO2: 92% 96% 96% 95%  Weight:      Height:        Intake/Output  Summary (Last 24 hours) at 07/27/2019 1541 Last data filed at 07/27/2019 1353 Gross per 24 hour  Intake 540 ml  Output 1001 ml  Net -461 ml   Filed Weights   07/25/19 2156 07/26/19 0512  Weight: 56.7 kg 55.7 kg    Examination:  General exam: Appears calm and comfortable, very hard of hearing. Respiratory system: Clear to auscultation. Respiratory effort normal. Cardiovascular system: S1 & S2 heard, RRR. No JVD, murmurs, rubs, gallops or clicks. Gastrointestinal system: Soft, nontender, nondistended, bowel sounds positive. Central nervous system: Alert, unable to get proper answers for orientation questions as patient is very hard of hearing.  No focal neurological deficits. Extremities: No edema, no cyanosis, pulses intact and symmetrical. Psychiatry: Judgement and insight appear normal.   DVT prophylaxis: Lovenox Code Status: Full Family Communication: No family at bedside. Disposition Plan:  Status is: Inpatient  Remains inpatient appropriate because:Inpatient level of care appropriate due to severity of illness   Dispo: The patient is from: Home              Anticipated d/c is to: SNF              Anticipated d/c date is: 1 day              Patient currently is not medically stable to d/c.   Consultants:   Orthopedic  Procedures:  Antimicrobials:  Rocephin  Data Reviewed: I have personally reviewed following labs and imaging studies  CBC: Recent Labs  Lab 07/26/19 0001  WBC 12.8*  HGB 13.1  HCT 38.3  MCV 89.9  PLT 767   Basic Metabolic Panel: Recent Labs  Lab 07/26/19 0001  NA 136  K 3.6  CL 101  CO2 24  GLUCOSE 141*  BUN 19  CREATININE 0.62  CALCIUM 9.1   GFR: Estimated Creatinine Clearance: 40.2 mL/min (by C-G formula based on SCr of 0.62 mg/dL). Liver Function Tests: No results for input(s): AST, ALT, ALKPHOS, BILITOT, PROT, ALBUMIN in the last 168 hours. No results for input(s): LIPASE, AMYLASE in the last 168 hours. No results for  input(s): AMMONIA in the last 168 hours. Coagulation Profile: No results for input(s): INR, PROTIME in the last 168 hours. Cardiac Enzymes: No results for input(s): CKTOTAL, CKMB, CKMBINDEX, TROPONINI in the last 168 hours. BNP (last 3 results) No results for input(s): PROBNP in the last 8760 hours. HbA1C: No results for input(s): HGBA1C in the last 72 hours. CBG: No results for input(s): GLUCAP in the last 168 hours. Lipid Profile: No results for input(s): CHOL, HDL, LDLCALC, TRIG, CHOLHDL, LDLDIRECT in the last 72 hours. Thyroid Function Tests: No results for input(s): TSH, T4TOTAL, FREET4, T3FREE, THYROIDAB in the last 72 hours. Anemia Panel: No results for input(s): VITAMINB12, FOLATE, FERRITIN, TIBC, IRON, RETICCTPCT in the last 72 hours. Sepsis Labs: No results for input(s): PROCALCITON, LATICACIDVEN in the last 168 hours.  Recent Results (from the past 240 hour(s))  SARS Coronavirus 2 by RT PCR (hospital order, performed in [facility] hospital lab) Nasopharyngeal Nasopharyngeal Swab     Status: None   Collection Time: 07/26/19 12:01 AM   Specimen: Nasopharyngeal Swab  Result Value Ref Range Status   SARS Coronavirus 2 NEGATIVE NEGATIVE Final    Comment: (NOTE) SARS-CoV-2 target nucleic acids are NOT DETECTED.  The SARS-CoV-2 RNA is generally detectable in upper and lower respiratory specimens during the acute phase of infection. The lowest concentration of SARS-CoV-2 viral copies this assay can detect is 250 copies / mL. A negative result does not preclude SARS-CoV-2 infection and should not be used as the sole basis for treatment or other patient management decisions.  A negative result may occur with improper specimen collection / handling, submission of specimen other than nasopharyngeal swab, presence of viral mutation(s) within the areas targeted by this assay, and inadequate number of viral copies (<250 copies / mL). A negative result must be combined with  clinical observations, patient history, and epidemiological information.  Fact Sheet for Patients:   StrictlyIdeas.no  Fact Sheet for Healthcare Providers: BankingDealers.co.za  This test is not yet approved or  cleared by the [place] FDA and has been authorized for detection and/or diagnosis of SARS-CoV-2 by FDA under an Emergency Use Authorization (EUA).  This EUA will remain in effect (meaning this test can be used) for the duration of the COVID-19 declaration under Section 564(b)(1) of the Act, 21 U.S.C. section 360bbb-3(b)(1), unless the authorization is terminated or revoked sooner.  Performed at [facility], [address], [place], [place] [address]   Urine culture     Status: Abnormal (Preliminary result)   Collection Time: 07/26/19  2:21 AM   Specimen: Urine, Random  Result Value Ref Range Status   Specimen Description   Final    URINE, RANDOM Performed at [facility], [address] [place], [place] [address]    Special Requests   Final    NONE Performed at [facility], [address], [place], [place] [address]  Culture >=100,000 COLONIES/mL ESCHERICHIA COLI (A)  Final   Report Status PENDING  Incomplete     Radiology Studies: DG Chest Port 1 View  Result Date: 07/26/2019 CLINICAL DATA:  [place], found down, right hip pain EXAM: PORTABLE CHEST 1 VIEW COMPARISON:  05/31/2012 FINDINGS: Single frontal view of the chest demonstrates an unremarkable cardiac silhouette. No airspace disease, effusion, or pneumothorax. No acute bony abnormalities. IMPRESSION: 1. No acute intrathoracic process. Electronically Signed   By: Randa Ngo M.D.   On: 07/26/2019 00:29   DG Hip Unilat  With Pelvis 2-3 Views Right  Result Date: 07/25/2019 CLINICAL DATA:  Fall, right hip pain EXAM: DG HIP (WITH OR WITHOUT PELVIS) 2-3V RIGHT COMPARISON:  07/23/2019 FINDINGS: Hardware within the the proximal  right femur related to remote injury. Mild degenerative changes in the hips. Fracture seen in the right inferior pubic ramus, new since prior study. Also new, fracture through the right pubic bone near the pubic symphysis. IMPRESSION: Right inferior pubic and pubic bone fractures. Electronically Signed   By: Rolm Baptise M.D.   On: 07/25/2019 22:22   DG FEMUR, MIN 2 VIEWS RIGHT  Result Date: 07/26/2019 CLINICAL DATA:  Right hip pain. EXAM: RIGHT FEMUR 2 VIEWS COMPARISON:  June 26 21 FINDINGS: Again noted is a fracture of the right inferior pubic ramus. The patient is status post prior intramedullary nail placement through the right femur. The hardware appears grossly intact. There is no additional acute displaced fracture. There are degenerative changes of the right knee. There is no significant suprapatellar joint effusion. IMPRESSION: 1. Status post placement of an intramedullary nail through the right femur without evidence for an acute displaced fracture involving the right femur. 2. Again noted are right-sided pubic bone fractures as previously described. Electronically Signed   By: Constance Holster M.D.   On: 07/26/2019 18:23    Scheduled Meds:  acetaminophen  650 mg Oral Q6H   enoxaparin (LOVENOX) injection  40 mg Subcutaneous Q24H   famotidine  20 mg Oral Daily   hydrochlorothiazide  25 mg Oral Daily   ibuprofen  400 mg Oral TID   verapamil  180 mg Oral Daily   Continuous Infusions:  cefTRIAXone (ROCEPHIN)  IV 1 g (07/27/19 1016)     LOS: 1 day   Time spent: 40 minutes.  Lorella Nimrod, MD [facility]  If 7PM-7AM, please contact night-coverage [email or web address]  07/27/2019, 3:41 PM   This record has been created using Systems analyst. Errors have been sought and corrected,but may not always be located. Such creation errors do not reflect on the standard of care.

## 2019-07-27 NOTE — TOC Progression Note (Signed)
Transition of Care [facility]) - Progression Note    Patient Details  Name: [name] [name] MRN: [id number] Date of Birth: 1929-08-29  Transition of Care [facility]) CM/SW [place], RN Phone Number: 07/27/2019, 1:16 PM  Clinical Narrative:      Met with the patient to discuss DC plan and needs She has been to [facility] previously and is agreeable to go to SNF, FL2. Bedsearch and PASSR completed., Has had covid vaccines, is very hard of hearing, will review bed offers once obtained       Expected Discharge Plan and Services                                                 Social Determinants of Health (SDOH) Interventions    Readmission Risk Interventions No flowsheet data found.

## 2019-07-28 DIAGNOSIS — R52 Pain, unspecified: Secondary | ICD-10-CM

## 2019-07-28 LAB — BASIC METABOLIC PANEL
Anion gap: 9 (ref 5–15)
BUN: 16 mg/dL (ref 8–23)
CO2: 27 mmol/L (ref 22–32)
Calcium: 8.8 mg/dL — ABNORMAL LOW (ref 8.9–10.3)
Chloride: 103 mmol/L (ref 98–111)
Creatinine, Ser: 0.65 mg/dL (ref 0.44–1.00)
GFR calc Af Amer: >60 mL/min (ref >60)
GFR calc non Af Amer: >60 mL/min (ref >60)
Glucose, Bld: 104 mg/dL — ABNORMAL HIGH (ref 70–99)
Potassium: 3.6 mmol/L (ref 3.5–5.1)
Sodium: 139 mmol/L (ref 135–145)

## 2019-07-28 LAB — CBC
HCT: 37.3 % (ref 36.0–46.0)
Hemoglobin: 12.8 g/dL (ref 12.0–15.0)
MCH: 30.9 pg (ref 26.0–34.0)
MCHC: 34.3 g/dL (ref 30.0–36.0)
MCV: 90.1 fL (ref 80.0–100.0)
Platelets: 192 10*3/uL (ref 150–400)
RBC: 4.14 MIL/uL (ref 3.87–5.11)
RDW: 13.3 % (ref 11.5–15.5)
WBC: 8 10*3/uL (ref 4.0–10.5)
nRBC: 0 % (ref 0.0–0.2)

## 2019-07-28 LAB — URINE CULTURE: Culture: >=100000 — AB

## 2019-07-28 MED ORDER — IBUPROFEN 400 MG PO TABS: 400.0000 mg | ORAL_TABLET | Freq: Three times a day (TID) | ORAL | 0 refills | Status: DC

## 2019-07-28 MED ORDER — CEPHALEXIN 500 MG PO CAPS
500.0000 mg | ORAL_CAPSULE | Freq: Two times a day (BID) | ORAL | Status: DC
  Administered 2019-07-28: 500 mg via ORAL
  Filled 2019-07-28: qty 1

## 2019-07-28 MED ORDER — HYDROCODONE-ACETAMINOPHEN 5-325 MG PO TABS: 1.0000 | ORAL_TABLET | ORAL | 0 refills | Status: DC | PRN

## 2019-07-28 MED ORDER — CEPHALEXIN 500 MG PO CAPS: 500.0000 mg | ORAL_CAPSULE | Freq: Two times a day (BID) | ORAL | 0 refills | Status: AC

## 2019-07-28 NOTE — TOC Progression Note (Signed)
[facility]) - Progression Note    Patient Details  Name: [name] [name] MRN: [id number] Date of Birth: Feb 16, 1929  [facility]) CM/SW [place], RN Phone Number: 07/28/2019, 2:30 PM  Clinical Narrative:     Bedside Nurse called report to [facility], The patient DC packet is on the chart, RNCM called [facility] and they will pick her up at 4 PM to transport to [facility]       Expected Discharge Plan and Services           Expected Discharge Date: 07/28/19                                     Social Determinants of Health (SDOH) Interventions    Readmission Risk Interventions No flowsheet data found.

## 2019-07-28 NOTE — TOC Progression Note (Signed)
Transition of Care [facility]) - Progression Note    Patient Details  Name: [name] [name] MRN: [id number] Date of Birth: [birth year]  Transition of Care [facility]) CM/SW [place], RN Phone Number: 07/28/2019, 12:49 PM  Clinical Narrative:    Met with the patient and the son to discuss and review bed choices, they chose [facility], She has had her covid vaccine, I notified Seth Bake at [facility]        Expected Discharge Plan and Services           Expected Discharge Date: 07/28/19                                     Social Determinants of Health (SDOH) Interventions    Readmission Risk Interventions No flowsheet data found.

## 2019-07-28 NOTE — Progress Notes (Signed)
Patient transferred via ems to [place] room 213. IV removed from RFA.

## 2019-07-28 NOTE — Progress Notes (Signed)
Report  Called to [place] LPN at [facility].

## 2019-07-28 NOTE — NC FL2 (Signed)
[place] LEVEL OF CARE SCREENING TOOL     IDENTIFICATION  Patient Name: ANIELLE HEADRICK Birthdate: 1930-01-16 Sex: female Admission Date (Current Location): 07/25/2019  [place] and [place] Number:  Engineering geologist and Address:  [facility], [address], [place], [place] [address]      Provider Number: [id number]  Attending Physician Name and Address:  Lorella Nimrod, MD  Relative Name and Phone Number:  Tatumn Corbridge son [phone]    Current Level of Care: Hospital Recommended Level of Care: [place] Prior Approval Number:    Date Approved/Denied:   PASRR Number: [id number]  Discharge Plan: SNF    Current Diagnoses: Patient Active Problem List   Diagnosis Date Noted  . Pain   . Dehydration   . Accidental fall 07/26/2019  . UTI (urinary tract infection) 07/26/2019  . Closed fracture of right inferior pubic ramus ([place]) 07/26/2019  . HTN (hypertension) 07/26/2019  . Ambulatory dysfunction 07/26/2019  . Pubic bone fracture (HCC) 07/26/2019    Orientation RESPIRATION BLADDER Height & Weight     Self, Time, Situation, Place  Normal Continent Weight: 55.7 kg Height:  5\' 3"  (160 cm)  BEHAVIORAL SYMPTOMS/MOOD NEUROLOGICAL BOWEL NUTRITION STATUS      Continent Diet (Heart Healthy)  AMBULATORY STATUS COMMUNICATION OF NEEDS Skin   Extensive Assist Verbally Surgical wounds                       Personal Care Assistance Level of Assistance  Bathing, Dressing Bathing Assistance: Limited assistance   Dressing Assistance: Limited assistance     Functional Limitations Info  Hearing   Hearing Info: Impaired      SPECIAL CARE FACTORS FREQUENCY  PT (By licensed PT)     PT Frequency: 5 times per week              Contractures Contractures Info: Not present    Additional Factors Info  Code Status, Allergies Code Status Info: full code Allergies Info: Bactrim, CIpro, PNA vaccines            Current Medications (07/28/2019):  This is the current hospital active medication list Current Facility-Administered Medications  Medication Dose Route Frequency Provider Last Rate Last Admin  . acetaminophen (TYLENOL) tablet 650 mg  650 mg Oral Q6H Arrien, Jimmy Picket, MD   650 mg at 07/28/19 1221  . cephALEXin (KEFLEX) capsule 500 mg  500 mg Oral Q12H Lorella Nimrod, MD   500 mg at 07/28/19 1020  . enoxaparin (LOVENOX) injection 40 mg  40 mg Subcutaneous Q24H Judd Gaudier V, MD   40 mg at 07/28/19 0939  . famotidine (PEPCID) tablet 20 mg  20 mg Oral Daily Arrien, Jimmy Picket, MD   20 mg at 07/28/19 0937  . hydrochlorothiazide (HYDRODIURIL) tablet 25 mg  25 mg Oral Daily Lorella Nimrod, MD   25 mg at 07/28/19 0937  . HYDROcodone-acetaminophen (NORCO/VICODIN) 5-325 MG per tablet 1 tablet  1 tablet Oral Q4H PRN Arrien, Jimmy Picket, MD      . ibuprofen (ADVIL) tablet 400 mg  400 mg Oral TID Tawni Millers, MD   400 mg at 07/28/19 0937  . morphine 2 MG/ML injection 1 mg  1 mg Intravenous Q2H PRN Arrien, Jimmy Picket, MD      . ondansetron [facility]) tablet 4 mg  4 mg Oral Q6H PRN Athena Masse, MD       Or  . ondansetron [facility]) injection  4 mg  4 mg Intravenous Q6H PRN Athena Masse, MD      . verapamil (CALAN-SR) CR tablet 180 mg  180 mg Oral Daily Lorella Nimrod, MD   180 mg at 07/28/19 6825     Discharge Medications: Please see discharge summary for a list of discharge medications.  Relevant Imaging Results:  Relevant Lab Results:   Additional Information SS# [id number]   Has had Covid vaccines  Kindell Strada Jen Mow, RN

## 2019-07-28 NOTE — Plan of Care (Signed)
°  Problem: Clinical Measurements: Goal: Will remain free from infection Outcome: Progressing Goal: Respiratory complications will improve Outcome: Progressing Goal: Cardiovascular complication will be avoided Outcome: Progressing   Problem: Nutrition: Goal: Adequate nutrition will be maintained Outcome: Progressing   Problem: Coping: Goal: Level of anxiety will decrease Outcome: Progressing

## 2019-07-28 NOTE — Progress Notes (Signed)
Patient in bed son at bedside this Probation officer entered room to give scheduled Tylenol , Patient looked at me and said [name] [name] I took my verapamil and Hydrochlorthiazide." Notified Dr. Reesa Chew and educated patients son that patients cannot give themselves medication in the hospital without an order. Per Dr. Reesa Chew, continue to monitor patients BP , last BP 118/71

## 2019-07-28 NOTE — Discharge Summary (Signed)
Physician Discharge Summary  TELESHIA LEMERE YBW:[id number] DOB: 1929-12-24 DOA: 07/25/2019  PCP: [name], MD  Admit date: 07/25/2019 Discharge date: 07/28/2019  Admitted From: Home, independent living, [facility] Disposition: SNF  Recommendations for Outpatient Follow-up:  1. Follow up with PCP in 1-2 weeks 2. Please obtain BMP/CBC in one week 3. Please follow up on the following pending results: None  Home Health:No Equipment/Devices: Rolling walker Discharge Condition: Stable CODE STATUS: Full Diet recommendation: Heart Healthy   Brief/Interim Summary: 86 year old female with past medical history for hypertension, dyslipidemia and anxiety who presents after a mechanical fall. Patient was found down on the kitchen floor, she had an unwitnessed fall, she did not recall the mechanism of the fall but denied any loss of consciousness. Right hip films with right inferior pubic and pubic bone fractures. Orthopedic was consulted by the admitting provider and they recommend weightbearing and no other intervention at there is no intertrochanteric fracture. Patient was able to work with physical therapist and they were recommending rehab before going back home.  Patient is very hard of hearing and underlying dementia.  On admission UA with positive nitrates but no pyuria.  It was difficult to assess urinary symptoms due to her hearing and underlying dementia.  Urine culture grew E. coli.  Patient remained afebrile but did had some leukocytosis.  We decided to treat her.  She was given ceftriaxone while in the hospital and discharged on Keflex for 3 more days.  She will continue home dose of antihypertensives and her supplements.  Discharge Diagnoses:  Principal Problem:   Closed fracture of right inferior pubic ramus (HCC) Active Problems:   Accidental fall   HTN (hypertension)   Ambulatory dysfunction   Pubic bone fracture (HCC)   Dehydration   Pain  Discharge  Instructions  Discharge Instructions    Diet - low sodium heart healthy   Complete by: As directed    Increase activity slowly   Complete by: As directed      Allergies as of 07/28/2019      Reactions   Bactrim [sulfamethoxazole-trimethoprim] Other (See Comments)   Hearing loss   Ciprofloxacin Other (See Comments)   Flu-like symptoms   Pneumococcal Vaccines Other (See Comments)   Flu-like symptoms      Medication List    STOP taking these medications   traMADol 50 MG tablet Commonly known as: Ultram     TAKE these medications   ascorbic acid 1000 MG tablet Commonly known as: VITAMIN C Take 1,000 mg by mouth daily.   b complex vitamins capsule Take 1 capsule by mouth daily.   beta carotene 25000 UNIT capsule Take 25,000 Units by mouth daily.   Biotin 5 MG Caps Take 5 mg by mouth.   calcium carbonate 1250 MG capsule Take 1,250 mg by mouth daily.   cephALEXin 500 MG capsule Commonly known as: KEFLEX Take 1 capsule (500 mg total) by mouth every 12 (twelve) hours for 6 doses.   fluticasone 50 MCG/ACT nasal spray Commonly known as: FLONASE Place 2 sprays into both nostrils daily as needed for rhinitis.   folic acid 768 MCG tablet Commonly known as: FOLVITE Take 800 mcg by mouth daily.   GARLIC PO Take 115 mg by mouth daily.   GLUCOSAMINE PO Take 1 Dose by mouth daily.   HAIR/SKIN/NAILS PO Take 1 tablet by mouth daily.   hydrochlorothiazide 25 MG tablet Commonly known as: HYDRODIURIL Take 25 mg by mouth daily.   HYDROcodone-acetaminophen 5-325 MG tablet Commonly  known as: NORCO/VICODIN Take 1 tablet by mouth every 4 (four) hours as needed for moderate pain.   ibuprofen 400 MG tablet Commonly known as: ADVIL Take 1 tablet (400 mg total) by mouth 3 (three) times daily.   lidocaine 5 % Commonly known as: Lidoderm Place 1 patch onto the skin every 12 (twelve) hours. Remove & Discard patch within 12 hours or as directed by MD   lovastatin 40 MG  tablet Commonly known as: MEVACOR Take 40 mg by mouth at bedtime.   multivitamin tablet Take 1 tablet by mouth daily.   polyethylene glycol 17 g packet Commonly known as: MIRALAX / GLYCOLAX Take 17 g by mouth daily.   Selenium 100 MCG Tabs Take 100 mcg by mouth daily.   tretinoin 0.05 % cream Commonly known as: RETIN-A Apply 1 Dose topically at bedtime.   verapamil 180 MG CR tablet Commonly known as: CALAN-SR Take 180 mg by mouth daily.   vitamin E 180 MG (400 UNITS) capsule Take 400 Units by mouth daily.       Follow-up Information    [name], MD. Schedule an appointment as soon as possible for a visit.   Specialty: Family Medicine Contact information: [address] [place] [address] [phone]              Allergies  Allergen Reactions  . Bactrim [Sulfamethoxazole-Trimethoprim] Other (See Comments)    Hearing loss  . Ciprofloxacin Other (See Comments)    Flu-like symptoms  . Pneumococcal Vaccines Other (See Comments)    Flu-like symptoms    Consultations:  Curbside orthopedic  Procedures/Studies: DG Pelvis 1-2 Views  Result Date: 07/23/2019 CLINICAL DATA:  Right hip and lower leg pain. EXAM: PELVIS - 1-2 VIEW; RIGHT FEMUR 2 VIEWS COMPARISON:  06/01/2012 FINDINGS: Postsurgical changes from ORIF of the right hip and femur via long intramedullary rod with distal interlocking screws and proximal lag screw. No periprosthetic lucency or fracture. Hip joint is intact without dislocation. Mild bilateral hip joint space narrowing. Pelvic bony ring appears intact. There is mild osseous demineralization. Sacrum is partially obscured by overlying bowel gas. No focal soft tissue abnormality. IMPRESSION: 1. No acute fracture or dislocation. 2. Postsurgical changes from ORIF of the right hip and femur without evidence of hardware complication. Electronically Signed   By: Davina Poke D.O.   On: 07/23/2019 11:14   DG Chest Port 1  View  Result Date: 07/26/2019 CLINICAL DATA:  [place], found down, right hip pain EXAM: PORTABLE CHEST 1 VIEW COMPARISON:  05/31/2012 FINDINGS: Single frontal view of the chest demonstrates an unremarkable cardiac silhouette. No airspace disease, effusion, or pneumothorax. No acute bony abnormalities. IMPRESSION: 1. No acute intrathoracic process. Electronically Signed   By: Randa Ngo M.D.   On: 07/26/2019 00:29   DG Hip Unilat  With Pelvis 2-3 Views Right  Result Date: 07/25/2019 CLINICAL DATA:  Fall, right hip pain EXAM: DG HIP (WITH OR WITHOUT PELVIS) 2-3V RIGHT COMPARISON:  07/23/2019 FINDINGS: Hardware within the the proximal right femur related to remote injury. Mild degenerative changes in the hips. Fracture seen in the right inferior pubic ramus, new since prior study. Also new, fracture through the right pubic bone near the pubic symphysis. IMPRESSION: Right inferior pubic and pubic bone fractures. Electronically Signed   By: Rolm Baptise M.D.   On: 07/25/2019 22:22   DG FEMUR, MIN 2 VIEWS RIGHT  Result Date: 07/26/2019 CLINICAL DATA:  Right hip pain. EXAM: RIGHT FEMUR 2 VIEWS COMPARISON:  June 26 21 FINDINGS: Again noted is a fracture of the right inferior pubic ramus. The patient is status post prior intramedullary nail placement through the right femur. The hardware appears grossly intact. There is no additional acute displaced fracture. There are degenerative changes of the right knee. There is no significant suprapatellar joint effusion. IMPRESSION: 1. Status post placement of an intramedullary nail through the right femur without evidence for an acute displaced fracture involving the right femur. 2. Again noted are right-sided pubic bone fractures as previously described. Electronically Signed   By: Constance Holster M.D.   On: 07/26/2019 18:23   DG FEMUR, MIN 2 VIEWS RIGHT  Result Date: 07/23/2019 CLINICAL DATA:  Right hip and lower leg pain. EXAM: PELVIS - 1-2 VIEW; RIGHT FEMUR 2  VIEWS COMPARISON:  06/01/2012 FINDINGS: Postsurgical changes from ORIF of the right hip and femur via long intramedullary rod with distal interlocking screws and proximal lag screw. No periprosthetic lucency or fracture. Hip joint is intact without dislocation. Mild bilateral hip joint space narrowing. Pelvic bony ring appears intact. There is mild osseous demineralization. Sacrum is partially obscured by overlying bowel gas. No focal soft tissue abnormality. IMPRESSION: 1. No acute fracture or dislocation. 2. Postsurgical changes from ORIF of the right hip and femur without evidence of hardware complication. Electronically Signed   By: Davina Poke D.O.   On: 07/23/2019 11:14    Subjective: Patient was complaining of pain with walking and moving.  No pain while resting.  Denies any other complaints.  Walked with her son on phone.  Discharge Exam: Vitals:   07/28/19 0731 07/28/19 1206  BP: (!) 147/80 118/71  Pulse: 77 78  Resp: 16   Temp: 98.1 F (36.7 C)   SpO2: 98% 98%   Vitals:   07/27/19 1700 07/27/19 2320 07/28/19 0731 07/28/19 1206  BP: (!) 143/80 136/67 (!) 147/80 118/71  Pulse:  74 77 78  Resp:  20 16   Temp:  98.1 F (36.7 C) 98.1 F (36.7 C)   TempSrc:   Oral   SpO2:  96% 98% 98%  Weight:      Height:        General: Pt is alert, awake, not in acute distress Cardiovascular: RRR, S1/S2 +, no rubs, no gallops Respiratory: CTA bilaterally, no wheezing, no rhonchi Abdominal: Soft, NT, ND, bowel sounds + Extremities: no edema, no cyanosis   The results of significant diagnostics from this hospitalization (including imaging, microbiology, ancillary and laboratory) are listed below for reference.    Microbiology: Recent Results (from the past 240 hour(s))  SARS Coronavirus 2 by RT PCR (hospital order, performed in [facility] hospital lab) Nasopharyngeal Nasopharyngeal Swab     Status: None   Collection Time: 07/26/19 12:01 AM   Specimen: Nasopharyngeal Swab  Result  Value Ref Range Status   SARS Coronavirus 2 NEGATIVE NEGATIVE Final    Comment: (NOTE) SARS-CoV-2 target nucleic acids are NOT DETECTED.  The SARS-CoV-2 RNA is generally detectable in upper and lower respiratory specimens during the acute phase of infection. The lowest concentration of SARS-CoV-2 viral copies this assay can detect is 250 copies / mL. A negative result does not preclude SARS-CoV-2 infection and should not be used as the sole basis for treatment or other patient management decisions.  A negative result may occur with improper specimen collection / handling, submission of specimen other than nasopharyngeal swab, presence of viral mutation(s) within the areas targeted by this assay, and inadequate number of viral copies (<  250 copies / mL). A negative result must be combined with clinical observations, patient history, and epidemiological information.  Fact Sheet for Patients:   StrictlyIdeas.no  Fact Sheet for Healthcare Providers: BankingDealers.co.za  This test is not yet approved or  cleared by the [place] FDA and has been authorized for detection and/or diagnosis of SARS-CoV-2 by FDA under an Emergency Use Authorization (EUA).  This EUA will remain in effect (meaning this test can be used) for the duration of the COVID-19 declaration under Section 564(b)(1) of the Act, 21 U.S.C. section 360bbb-3(b)(1), unless the authorization is terminated or revoked sooner.  Performed at [facility], [address] [place], [place] [address]   Urine culture     Status: Abnormal   Collection Time: 07/26/19  2:21 AM   Specimen: Urine, Random  Result Value Ref Range Status   Specimen Description   Final    URINE, RANDOM Performed at [facility], [address] [place], [place] [address]    Special Requests   Final    NONE Performed at [facility], [place], [place]  [address]    Culture >=100,000 COLONIES/mL ESCHERICHIA COLI (A)  Final   Report Status 07/28/2019 FINAL  Final   Organism ID, Bacteria ESCHERICHIA COLI (A)  Final      Susceptibility   Escherichia coli - MIC*    AMPICILLIN 4 SENSITIVE Sensitive     CEFAZOLIN <=4 SENSITIVE Sensitive     CEFTRIAXONE <=0.25 SENSITIVE Sensitive     CIPROFLOXACIN <=0.25 SENSITIVE Sensitive     GENTAMICIN <=1 SENSITIVE Sensitive     IMIPENEM <=0.25 SENSITIVE Sensitive     NITROFURANTOIN <=16 SENSITIVE Sensitive     TRIMETH/SULFA <=20 SENSITIVE Sensitive     AMPICILLIN/SULBACTAM <=2 SENSITIVE Sensitive     PIP/TAZO <=4 SENSITIVE Sensitive     * >=100,000 COLONIES/mL ESCHERICHIA COLI  SARS CORONAVIRUS 2 (TAT 6-24 HRS) Nasopharyngeal Nasopharyngeal Swab     Status: None   Collection Time: 07/27/19 12:44 PM   Specimen: Nasopharyngeal Swab  Result Value Ref Range Status   SARS Coronavirus 2 NEGATIVE NEGATIVE Final    Comment: (NOTE) SARS-CoV-2 target nucleic acids are NOT DETECTED.  The SARS-CoV-2 RNA is generally detectable in upper and lower respiratory specimens during the acute phase of infection. Negative results do not preclude SARS-CoV-2 infection, do not rule out co-infections with other pathogens, and should not be used as the sole basis for treatment or other patient management decisions. Negative results must be combined with clinical observations, patient history, and epidemiological information. The expected result is Negative.  Fact Sheet for Patients: SugarRoll.be  Fact Sheet for Healthcare Providers: https://www.woods-mathews.com/  This test is not yet approved or cleared by the [place] FDA and  has been authorized for detection and/or diagnosis of SARS-CoV-2 by FDA under an Emergency Use Authorization (EUA). This EUA will remain  in effect (meaning this test can be used) for the duration of the COVID-19 declaration under Se ction 564(b)(1)  of the Act, 21 U.S.C. section 360bbb-3(b)(1), unless the authorization is terminated or revoked sooner.  Performed at [facility], [place] [address] [place], [place] [address]      Labs: BNP (last 3 results) No results for input(s): BNP in the last 8760 hours. Basic Metabolic Panel: Recent Labs  Lab 07/26/19 0001 07/28/19 0330  NA 136 139  K 3.6 3.6  CL 101 103  CO2 24 27  GLUCOSE 141* 104*  BUN 19  16  CREATININE 0.62 0.65  CALCIUM 9.1 8.8*   Liver Function Tests: No results for input(s): AST, ALT, ALKPHOS, BILITOT, PROT, ALBUMIN in the last 168 hours. No results for input(s): LIPASE, AMYLASE in the last 168 hours. No results for input(s): AMMONIA in the last 168 hours. CBC: Recent Labs  Lab 07/26/19 0001 07/28/19 0330  WBC 12.8* 8.0  HGB 13.1 12.8  HCT 38.3 37.3  MCV 89.9 90.1  PLT 184 192   Cardiac Enzymes: No results for input(s): CKTOTAL, CKMB, CKMBINDEX, TROPONINI in the last 168 hours. BNP: Invalid input(s): POCBNP CBG: No results for input(s): GLUCAP in the last 168 hours. D-Dimer No results for input(s): DDIMER in the last 72 hours. Hgb A1c No results for input(s): HGBA1C in the last 72 hours. Lipid Profile No results for input(s): CHOL, HDL, LDLCALC, TRIG, CHOLHDL, LDLDIRECT in the last 72 hours. Thyroid function studies No results for input(s): TSH, T4TOTAL, T3FREE, THYROIDAB in the last 72 hours.  Invalid input(s): FREET3 Anemia work up No results for input(s): VITAMINB12, FOLATE, FERRITIN, TIBC, IRON, RETICCTPCT in the last 72 hours. Urinalysis    Component Value Date/Time   COLORURINE YELLOW (A) 07/26/2019 0221   APPEARANCEUR HAZY (A) 07/26/2019 0221   APPEARANCEUR Cloudy 05/31/2012 2019   LABSPEC 1.011 07/26/2019 0221   LABSPEC 1.011 05/31/2012 2019   PHURINE 6.0 07/26/2019 0221   GLUCOSEU NEGATIVE 07/26/2019 0221   GLUCOSEU 50 mg/dL 05/31/2012 2019   HGBUR NEGATIVE 07/26/2019 0221   BILIRUBINUR NEGATIVE 07/26/2019 0221    BILIRUBINUR Negative 05/31/2012 2019   KETONESUR 20 (A) 07/26/2019 0221   PROTEINUR NEGATIVE 07/26/2019 0221   NITRITE POSITIVE (A) 07/26/2019 0221   LEUKOCYTESUR TRACE (A) 07/26/2019 0221   LEUKOCYTESUR Negative 05/31/2012 2019   Sepsis Labs Invalid input(s): PROCALCITONIN,  WBC,  LACTICIDVEN Microbiology Recent Results (from the past 240 hour(s))  SARS Coronavirus 2 by RT PCR (hospital order, performed in [place] hospital lab) Nasopharyngeal Nasopharyngeal Swab     Status: None   Collection Time: 07/26/19 12:01 AM   Specimen: Nasopharyngeal Swab  Result Value Ref Range Status   SARS Coronavirus 2 NEGATIVE NEGATIVE Final    Comment: (NOTE) SARS-CoV-2 target nucleic acids are NOT DETECTED.  The SARS-CoV-2 RNA is generally detectable in upper and lower respiratory specimens during the acute phase of infection. The lowest concentration of SARS-CoV-2 viral copies this assay can detect is 250 copies / mL. A negative result does not preclude SARS-CoV-2 infection and should not be used as the sole basis for treatment or other patient management decisions.  A negative result may occur with improper specimen collection / handling, submission of specimen other than nasopharyngeal swab, presence of viral mutation(s) within the areas targeted by this assay, and inadequate number of viral copies (<250 copies / mL). A negative result must be combined with clinical observations, patient history, and epidemiological information.  Fact Sheet for Patients:   StrictlyIdeas.no  Fact Sheet for Healthcare Providers: BankingDealers.co.za  This test is not yet approved or  cleared by the [place] FDA and has been authorized for detection and/or diagnosis of SARS-CoV-2 by FDA under an Emergency Use Authorization (EUA).  This EUA will remain in effect (meaning this test can be used) for the duration of the COVID-19 declaration under Section  564(b)(1) of the Act, 21 U.S.C. section 360bbb-3(b)(1), unless the authorization is terminated or revoked sooner.  Performed at [facility], [address] [place], [place] [address]   Urine culture  Status: Abnormal   Collection Time: 07/26/19  2:21 AM   Specimen: Urine, Random  Result Value Ref Range Status   Specimen Description   Final    URINE, RANDOM Performed at [facility], [address] [place], [place] [address]    Special Requests   Final    NONE Performed at [facility], [place], [place] [address]    Culture >=100,000 COLONIES/mL ESCHERICHIA COLI (A)  Final   Report Status 07/28/2019 FINAL  Final   Organism ID, Bacteria ESCHERICHIA COLI (A)  Final      Susceptibility   Escherichia coli - MIC*    AMPICILLIN 4 SENSITIVE Sensitive     CEFAZOLIN <=4 SENSITIVE Sensitive     CEFTRIAXONE <=0.25 SENSITIVE Sensitive     CIPROFLOXACIN <=0.25 SENSITIVE Sensitive     GENTAMICIN <=1 SENSITIVE Sensitive     IMIPENEM <=0.25 SENSITIVE Sensitive     NITROFURANTOIN <=16 SENSITIVE Sensitive     TRIMETH/SULFA <=20 SENSITIVE Sensitive     AMPICILLIN/SULBACTAM <=2 SENSITIVE Sensitive     PIP/TAZO <=4 SENSITIVE Sensitive     * >=100,000 COLONIES/mL ESCHERICHIA COLI  SARS CORONAVIRUS 2 (TAT 6-24 HRS) Nasopharyngeal Nasopharyngeal Swab     Status: None   Collection Time: 07/27/19 12:44 PM   Specimen: Nasopharyngeal Swab  Result Value Ref Range Status   SARS Coronavirus 2 NEGATIVE NEGATIVE Final    Comment: (NOTE) SARS-CoV-2 target nucleic acids are NOT DETECTED.  The SARS-CoV-2 RNA is generally detectable in upper and lower respiratory specimens during the acute phase of infection. Negative results do not preclude SARS-CoV-2 infection, do not rule out co-infections with other pathogens, and should not be used as the sole basis for treatment or other patient management decisions. Negative results must be combined with clinical  observations, patient history, and epidemiological information. The expected result is Negative.  Fact Sheet for Patients: SugarRoll.be  Fact Sheet for Healthcare Providers: https://www.woods-mathews.com/  This test is not yet approved or cleared by the [place] FDA and  has been authorized for detection and/or diagnosis of SARS-CoV-2 by FDA under an Emergency Use Authorization (EUA). This EUA will remain  in effect (meaning this test can be used) for the duration of the COVID-19 declaration under Se ction 564(b)(1) of the Act, 21 U.S.C. section 360bbb-3(b)(1), unless the authorization is terminated or revoked sooner.  Performed at [facility], [place] [address], [place], [place] [address]     Time coordinating discharge: Over 30 minutes  SIGNED:  Lorella Nimrod, MD  [facility] 07/28/2019, 12:29 PM  If 7PM-7AM, please contact night-coverage [email or web address]  This record has been created using Systems analyst. Errors have been sought and corrected,but may not always be located. Such creation errors do not reflect on the standard of care.

## 2019-07-30 DIAGNOSIS — M810 Age-related osteoporosis without current pathological fracture: Secondary | ICD-10-CM

## 2019-07-30 DIAGNOSIS — S3289XA Fracture of other parts of pelvis, initial encounter for closed fracture: Secondary | ICD-10-CM | POA: Diagnosis not present

## 2019-07-30 DIAGNOSIS — I10 Essential (primary) hypertension: Secondary | ICD-10-CM | POA: Diagnosis not present

## 2019-07-30 DIAGNOSIS — K5900 Constipation, unspecified: Secondary | ICD-10-CM | POA: Diagnosis not present

## 2019-08-20 DIAGNOSIS — M25552 Pain in left hip: Secondary | ICD-10-CM

## 2019-08-25 ENCOUNTER — Other Ambulatory Visit: Payer: Self-pay

## 2019-08-25 ENCOUNTER — Inpatient Hospital Stay
Admission: EM | Admit: 2019-08-25 | Discharge: 2019-08-28 | DRG: 309 | Disposition: A | Discharge status: Skilled Nursing Facility | Payer: Medicare Other | Attending: Internal Medicine | Admitting: Internal Medicine

## 2019-08-25 ENCOUNTER — Encounter: Payer: Self-pay | Admitting: Emergency Medicine

## 2019-08-25 ENCOUNTER — Emergency Department: Payer: Medicare Other

## 2019-08-25 DIAGNOSIS — W19XXXA Unspecified fall, initial encounter: Secondary | ICD-10-CM | POA: Diagnosis present

## 2019-08-25 DIAGNOSIS — Z79899 Other long term (current) drug therapy: Secondary | ICD-10-CM

## 2019-08-25 DIAGNOSIS — Z20822 Contact with and (suspected) exposure to covid-19: Secondary | ICD-10-CM | POA: Diagnosis present

## 2019-08-25 DIAGNOSIS — I10 Essential (primary) hypertension: Secondary | ICD-10-CM | POA: Diagnosis present

## 2019-08-25 DIAGNOSIS — S329XXA Fracture of unspecified parts of lumbosacral spine and pelvis, initial encounter for closed fracture: Secondary | ICD-10-CM | POA: Diagnosis present

## 2019-08-25 DIAGNOSIS — I480 Paroxysmal atrial fibrillation: Secondary | ICD-10-CM | POA: Diagnosis not present

## 2019-08-25 DIAGNOSIS — S3210XA Unspecified fracture of sacrum, initial encounter for closed fracture: Secondary | ICD-10-CM | POA: Diagnosis present

## 2019-08-25 DIAGNOSIS — I4891 Unspecified atrial fibrillation: Secondary | ICD-10-CM | POA: Diagnosis present

## 2019-08-25 DIAGNOSIS — M25552 Pain in left hip: Secondary | ICD-10-CM

## 2019-08-25 DIAGNOSIS — Z87891 Personal history of nicotine dependence: Secondary | ICD-10-CM | POA: Diagnosis not present

## 2019-08-25 DIAGNOSIS — H9192 Unspecified hearing loss, left ear: Secondary | ICD-10-CM | POA: Diagnosis present

## 2019-08-25 DIAGNOSIS — I493 Ventricular premature depolarization: Secondary | ICD-10-CM | POA: Diagnosis present

## 2019-08-25 DIAGNOSIS — M8580 Other specified disorders of bone density and structure, unspecified site: Secondary | ICD-10-CM | POA: Diagnosis present

## 2019-08-25 DIAGNOSIS — S32456A Nondisplaced transverse fracture of unspecified acetabulum, initial encounter for closed fracture: Secondary | ICD-10-CM | POA: Diagnosis not present

## 2019-08-25 DIAGNOSIS — Z8249 Family history of ischemic heart disease and other diseases of the circulatory system: Secondary | ICD-10-CM | POA: Diagnosis not present

## 2019-08-25 DIAGNOSIS — E785 Hyperlipidemia, unspecified: Secondary | ICD-10-CM | POA: Diagnosis present

## 2019-08-25 DIAGNOSIS — M545 Low back pain, unspecified: Secondary | ICD-10-CM

## 2019-08-25 DIAGNOSIS — S32009A Unspecified fracture of unspecified lumbar vertebra, initial encounter for closed fracture: Secondary | ICD-10-CM

## 2019-08-25 DIAGNOSIS — I4821 Permanent atrial fibrillation: Secondary | ICD-10-CM | POA: Diagnosis present

## 2019-08-25 LAB — BASIC METABOLIC PANEL
Anion gap: 9 (ref 5–15)
BUN: 16 mg/dL (ref 8–23)
CO2: 27 mmol/L (ref 22–32)
Calcium: 9.4 mg/dL (ref 8.9–10.3)
Chloride: 98 mmol/L (ref 98–111)
Creatinine, Ser: 0.59 mg/dL (ref 0.44–1.00)
GFR calc Af Amer: >60 mL/min (ref >60)
GFR calc non Af Amer: >60 mL/min (ref >60)
Glucose, Bld: 117 mg/dL — ABNORMAL HIGH (ref 70–99)
Potassium: 3.6 mmol/L (ref 3.5–5.1)
Sodium: 134 mmol/L — ABNORMAL LOW (ref 135–145)

## 2019-08-25 LAB — CBC
HCT: 39.7 % (ref 36.0–46.0)
Hemoglobin: 13.3 g/dL (ref 12.0–15.0)
MCH: 30.5 pg (ref 26.0–34.0)
MCHC: 33.5 g/dL (ref 30.0–36.0)
MCV: 91.1 fL (ref 80.0–100.0)
Platelets: 323 10*3/uL (ref 150–400)
RBC: 4.36 MIL/uL (ref 3.87–5.11)
RDW: 13.5 % (ref 11.5–15.5)
WBC: 8.5 10*3/uL (ref 4.0–10.5)
nRBC: 0 % (ref 0.0–0.2)

## 2019-08-25 LAB — TSH: TSH: 0.593 u[IU]/mL (ref 0.350–4.500)

## 2019-08-25 LAB — MAGNESIUM: Magnesium: 2.2 mg/dL (ref 1.7–2.4)

## 2019-08-25 LAB — SARS CORONAVIRUS 2 BY RT PCR (HOSPITAL ORDER, PERFORMED IN ~~LOC~~ HOSPITAL LAB): SARS Coronavirus 2: NEGATIVE

## 2019-08-25 MED ORDER — FLUTICASONE PROPIONATE 50 MCG/ACT NA SUSP
2.0000 | Freq: Every day | NASAL | Status: DC | PRN
  Filled 2019-08-25: qty 16

## 2019-08-25 MED ORDER — HYDROCHLOROTHIAZIDE 25 MG PO TABS
25.0000 mg | ORAL_TABLET | Freq: Every day | ORAL | Status: DC
  Administered 2019-08-26 – 2019-08-27 (×2): 25 mg via ORAL
  Filled 2019-08-25 (×2): qty 1

## 2019-08-25 MED ORDER — VERAPAMIL HCL ER 180 MG PO TBCR: 180.0000 mg | EXTENDED_RELEASE_TABLET | Freq: Every day | ORAL | Status: DC

## 2019-08-25 MED ORDER — LIDOCAINE 5 % EX PTCH
1.0000 | MEDICATED_PATCH | CUTANEOUS | Status: DC
  Administered 2019-08-26 – 2019-08-27 (×2): 1 via TRANSDERMAL
  Filled 2019-08-25 (×3): qty 1

## 2019-08-25 MED ORDER — IBUPROFEN 400 MG PO TABS: 400.0000 mg | ORAL_TABLET | Freq: Three times a day (TID) | ORAL | Status: DC

## 2019-08-25 MED ORDER — CALCIUM CARBONATE ANTACID 500 MG PO CHEW
1250.0000 mg | CHEWABLE_TABLET | Freq: Every day | ORAL | Status: DC
  Administered 2019-08-26 – 2019-08-28 (×3): 1250 mg via ORAL
  Filled 2019-08-25 (×3): qty 7

## 2019-08-25 MED ORDER — FOLIC ACID 1 MG PO TABS
1000.0000 ug | ORAL_TABLET | Freq: Every day | ORAL | Status: DC
  Administered 2019-08-26 – 2019-08-28 (×3): 1 mg via ORAL
  Filled 2019-08-25 (×3): qty 1

## 2019-08-25 MED ORDER — POTASSIUM CHLORIDE 20 MEQ PO PACK
40.0000 meq | PACK | Freq: Once | ORAL | Status: AC
  Administered 2019-08-27: 40 meq via ORAL
  Filled 2019-08-25: qty 2

## 2019-08-25 MED ORDER — DILTIAZEM LOAD VIA INFUSION
10.0000 mg | Freq: Once | INTRAVENOUS | Status: AC
  Administered 2019-08-26: 10 mg via INTRAVENOUS
  Filled 2019-08-25: qty 10

## 2019-08-25 MED ORDER — DILTIAZEM HCL 25 MG/5ML IV SOLN
5.0000 mg | Freq: Once | INTRAVENOUS | Status: AC
  Administered 2019-08-25: 5 mg via INTRAVENOUS
  Filled 2019-08-25: qty 5

## 2019-08-25 MED ORDER — LABETALOL HCL 5 MG/ML IV SOLN: 20.0000 mg | INTRAVENOUS | Status: DC | PRN

## 2019-08-25 MED ORDER — ASCORBIC ACID 500 MG PO TABS
1000.0000 mg | ORAL_TABLET | Freq: Every day | ORAL | Status: DC
  Administered 2019-08-26 – 2019-08-28 (×3): 1000 mg via ORAL
  Filled 2019-08-25 (×3): qty 2

## 2019-08-25 MED ORDER — GLUCOSAMINE 500 MG PO CAPS: ORAL_CAPSULE | Freq: Every day | ORAL | Status: DC

## 2019-08-25 MED ORDER — HYDROCODONE-ACETAMINOPHEN 5-325 MG PO TABS
1.0000 | ORAL_TABLET | ORAL | Status: DC | PRN
  Administered 2019-08-26 – 2019-08-27 (×4): 1 via ORAL
  Filled 2019-08-25 (×4): qty 1

## 2019-08-25 MED ORDER — SELENIUM 100 MCG PO TABS: 100.0000 ug | ORAL_TABLET | Freq: Every day | ORAL | Status: DC

## 2019-08-25 MED ORDER — GARLIC 580 MG PO CAPS: 810.0000 mg | ORAL_CAPSULE | Freq: Every day | ORAL | Status: DC

## 2019-08-25 MED ORDER — MORPHINE SULFATE (PF) 2 MG/ML IV SOLN
2.0000 mg | INTRAVENOUS | Status: DC | PRN
  Administered 2019-08-26 – 2019-08-27 (×2): 2 mg via INTRAVENOUS
  Filled 2019-08-25 (×3): qty 1

## 2019-08-25 MED ORDER — B COMPLEX VITAMINS PO CAPS: 1.0000 | ORAL_CAPSULE | Freq: Every day | ORAL | Status: DC

## 2019-08-25 MED ORDER — POLYETHYLENE GLYCOL 3350 17 G PO PACK
17.0000 g | PACK | Freq: Every day | ORAL | Status: DC
  Administered 2019-08-26 – 2019-08-28 (×3): 17 g via ORAL
  Filled 2019-08-25 (×3): qty 1

## 2019-08-25 MED ORDER — MORPHINE SULFATE (PF) 2 MG/ML IV SOLN
2.0000 mg | Freq: Once | INTRAVENOUS | Status: AC
  Administered 2019-08-25: 2 mg via INTRAVENOUS

## 2019-08-25 MED ORDER — DILTIAZEM HCL-DEXTROSE 125-5 MG/125ML-% IV SOLN (PREMIX)
5.0000 mg/h | INTRAVENOUS | Status: DC
  Administered 2019-08-26: 5 mg/h via INTRAVENOUS
  Filled 2019-08-25: qty 125

## 2019-08-25 MED ORDER — VITAMIN E 180 MG (400 UNIT) PO CAPS
400.0000 [IU] | ORAL_CAPSULE | Freq: Every day | ORAL | Status: DC
  Administered 2019-08-27 – 2019-08-28 (×2): 400 [IU] via ORAL
  Filled 2019-08-25 (×4): qty 1

## 2019-08-25 MED ORDER — PRAVASTATIN SODIUM 40 MG PO TABS
40.0000 mg | ORAL_TABLET | Freq: Every day | ORAL | Status: DC
  Administered 2019-08-26 – 2019-08-27 (×2): 40 mg via ORAL
  Filled 2019-08-25 (×3): qty 1

## 2019-08-25 MED ORDER — ACETAMINOPHEN 500 MG PO TABS
1000.0000 mg | ORAL_TABLET | Freq: Once | ORAL | Status: AC
  Administered 2019-08-25: 1000 mg via ORAL
  Filled 2019-08-25: qty 2

## 2019-08-25 MED ORDER — METHOCARBAMOL 500 MG PO TABS
500.0000 mg | ORAL_TABLET | Freq: Once | ORAL | Status: AC
  Administered 2019-08-25: 500 mg via ORAL
  Filled 2019-08-25: qty 1

## 2019-08-25 MED ORDER — ADULT MULTIVITAMIN W/MINERALS CH
1.0000 | ORAL_TABLET | Freq: Every day | ORAL | Status: DC
  Administered 2019-08-26 – 2019-08-28 (×3): 1 via ORAL
  Filled 2019-08-25 (×3): qty 1

## 2019-08-25 NOTE — ED Triage Notes (Signed)
Pt had fall last night. Pt unsure if she was weak and fell or not.  Pt reports was at [facility] for frequent falls but they never told her why she kept falling.  C/o left hip pain . Unlabored. NAD.  VSS.  Has been able to ambulate with walker when needed but c/o severe pain with it.

## 2019-08-25 NOTE — Progress Notes (Signed)
Patient arrived to room in CPOD with HR 150-170s. MD notified.

## 2019-08-25 NOTE — ED Provider Notes (Signed)
[facility] Emergency Department Provider Note ____________________________________________   First MD Initiated Contact with Patient 08/25/19 1734     (approximate)  I have reviewed the triage vital signs and the nursing notes.  HISTORY  Chief Complaint Hip Pain   HPI [name] [name] is a 86 y.o. female who presents to the ED for evaluation of left hip pain.    History of hypertension, HLD and anxiety. Admitted from 6/26-6/29 after mechanical fall causing right pubic bone and inferior pubic ramus fractures.  Managed nonoperatively with weightbearing as tolerated.  Discharge to SNF. Patient has since returned home, and reports a mechanical fall last night, where she slipped, falling back onto her left-sided buttocks.  Patient denies head trauma, syncope, preceding chest pain, shortness of breath or headache.  Past Medical History:  Diagnosis Date  . Anxiety   . Cancer ([place])    Squamous Cell Carcinoma (top of head)  . Constipation, chronic   . Deaf, left   . Diverticular disease   . Hearing aid worn   . Hearing deficit   . History of Bell's palsy    left side - resolved - 50 yrs ago  . Hypertension   . Internal hemorrhoids   . Osteopenia   . Pure hypercholesterolemia     Patient Active Problem List   Diagnosis Date Noted  . Pelvic fracture ([place]) 08/25/2019  . Pain   . Dehydration   . Accidental fall 07/26/2019  . UTI (urinary tract infection) 07/26/2019  . Closed fracture of right inferior pubic ramus ([place]) 07/26/2019  . HTN (hypertension) 07/26/2019  . Ambulatory dysfunction 07/26/2019  . Pubic bone fracture ([place]) 07/26/2019    Past Surgical History:  Procedure Laterality Date  . CATARACT EXTRACTION W/PHACO Left 11/16/2015   Procedure: CATARACT EXTRACTION PHACO AND INTRAOCULAR LENS PLACEMENT (IOC);  Surgeon: Leandrew Koyanagi, MD;  Location: [facility];  Service: Ophthalmology;  Laterality: Left;  TORIC  . CATARACT  EXTRACTION W/PHACO Right 12/07/2015   Procedure: CATARACT EXTRACTION PHACO AND INTRAOCULAR LENS PLACEMENT (IOC);  Surgeon: Leandrew Koyanagi, MD;  Location: [facility];  Service: Ophthalmology;  Laterality: Right;  TORIC  . COLONOSCOPY WITH PROPOFOL N/A 08/10/2014   Procedure: COLONOSCOPY WITH PROPOFOL;  Surgeon: Lollie Sails, MD;  Location: [facility] ENDOSCOPY;  Service: Endoscopy;  Laterality: N/A;  . FEMUR FRACTURE SURGERY    . Nasal Polyp Removal      Prior to Admission medications   Medication Sig Start Date End Date Taking? Authorizing Provider  ascorbic acid (VITAMIN C) 1000 MG tablet Take 1,000 mg by mouth daily.    [provider]  b complex vitamins capsule Take 1 capsule by mouth daily.    [provider]  beta carotene 25000 UNIT capsule Take 25,000 Units by mouth daily.    [provider]  Biotin 5 MG CAPS Take 5 mg by mouth.    [provider]  calcium carbonate 1250 MG capsule Take 1,250 mg by mouth daily.    [provider]  fluticasone (FLONASE) 50 MCG/ACT nasal spray Place 2 sprays into both nostrils daily as needed for rhinitis.    [provider]  folic acid (FOLVITE) 382 MCG tablet Take 800 mcg by mouth daily.    [provider]  GARLIC PO Take 505 mg by mouth daily.     [provider]  Glucosamine HCl (GLUCOSAMINE PO) Take 1 Dose by mouth daily.    [provider]  hydrochlorothiazide (HYDRODIURIL) 25  MG tablet Take 25 mg by mouth daily.    [provider]  HYDROcodone-acetaminophen (NORCO/VICODIN) 5-325 MG tablet Take 1 tablet by mouth every 4 (four) hours as needed for moderate pain. 07/28/19   Lorella Nimrod, MD  ibuprofen (ADVIL) 400 MG tablet Take 1 tablet (400 mg total) by mouth 3 (three) times daily. 07/28/19   Lorella Nimrod, MD  lidocaine (LIDODERM) 5 % Place 1 patch onto the skin every 12 (twelve) hours. Remove & Discard patch within 12 hours or as directed by MD  07/23/19 07/22/20  Sable Feil, PA-C  lovastatin (MEVACOR) 40 MG tablet Take 40 mg by mouth at bedtime.    [provider]  Multiple Vitamin (MULTIVITAMIN) tablet Take 1 tablet by mouth daily.    [provider]  Multiple Vitamins-Minerals (HAIR/SKIN/NAILS PO) Take 1 tablet by mouth daily.    [provider]  polyethylene glycol (MIRALAX / GLYCOLAX) 17 g packet Take 17 g by mouth daily.     [provider]  Selenium 100 MCG TABS Take 100 mcg by mouth daily.     [provider]  tretinoin (RETIN-A) 0.05 % cream Apply 1 Dose topically at bedtime.    [provider]  verapamil (CALAN-SR) 180 MG CR tablet Take 180 mg by mouth daily.     [provider]  vitamin E 400 UNIT capsule Take 400 Units by mouth daily.    [provider]    Allergies Bactrim [sulfamethoxazole-trimethoprim], Ciprofloxacin, and Pneumococcal vaccines  History reviewed. No pertinent family history.  Social History Social History   Tobacco Use  . Smoking status: Former Smoker    Quit date: 10/30/1985    Years since quitting: 33.8  . Smokeless tobacco: Never Used  Substance Use Topics  . Alcohol use: No  . Drug use: No    Review of Systems  Constitutional: No fever/chills Eyes: No visual changes. ENT: No sore throat. Cardiovascular: Denies chest pain. Respiratory: Denies shortness of breath. Gastrointestinal: No abdominal pain.  No nausea, no vomiting.  No diarrhea.  No constipation. Genitourinary: Negative for dysuria. Musculoskeletal: Negative for back pain.  Positive for left posterior pelvic/buttocks pain, difficulty walking Skin: Negative for rash. Neurological: Negative for headaches, focal weakness or numbness.   ____________________________________________   PHYSICAL EXAM:  VITAL SIGNS: ED Triage Vitals  Enc Vitals Group     BP 08/25/19 1441 (!) 142/76     Pulse Rate 08/25/19 1441 94     Resp 08/25/19 1441 16     Temp  08/25/19 1441 98.3 F (36.8 C)     Temp Source 08/25/19 1441 Oral     SpO2 08/25/19 1441 94 %     Weight 08/25/19 1442 125 lb (56.7 kg)     Height 08/25/19 1442 5\' 3"  (1.6 m)     Head Circumference --      Peak Flow --      Pain Score 08/25/19 1441 3     Pain Loc --      Pain Edu? --      Excl. in [place]? --     Constitutional: Alert and oriented.  Frail-appearing.  Hard of hearing.  With effort, she is able to sit up, get to the edge of the bed and stand.  She favors her right side, and places almost no weight onto her left foot.  She refuses to walk due to pain to her left posterior pelvis. Eyes: Conjunctivae are normal. PERRL. EOMI. Head: Atraumatic. Nose: No congestion/rhinnorhea.  Mouth/Throat: Mucous membranes are moist.  Oropharynx non-erythematous. Neck: No stridor. No cervical spine tenderness to palpation. Cardiovascular: Normal rate, regular rhythm. Grossly normal heart sounds.  Good peripheral circulation. Respiratory: Normal respiratory effort.  No retractions. Lungs CTAB. Gastrointestinal: Soft , nondistended, nontender to palpation. No abdominal bruits. No CVA tenderness. Musculoskeletal: No lower extremity tenderness nor edema.  No joint effusions. No signs of acute trauma. Neurologic:  Normal speech and language. No gross focal neurologic deficits are appreciated.  Skin:  Skin is warm, dry and intact. No rash noted. Psychiatric: Mood and affect are normal. Speech and behavior are normal.  ____________________________________________   LABS (all labs ordered are listed, but only abnormal results are displayed)  Labs Reviewed  BASIC METABOLIC PANEL - Abnormal; Notable for the following components:      Result Value   Sodium 134 (*)    Glucose, Bld 117 (*)    All other components within normal limits  SARS CORONAVIRUS 2 BY RT PCR (HOSPITAL ORDER, [place] [facility])  CBC  URINALYSIS, COMPLETE (UACMP) WITH MICROSCOPIC    ____________________________________________  12 Lead EKG Sinus rhythm, rate of 97 bpm.  Normal axis and intervals.  1 PVC is present.  No evidence of acute ischemia.  ____________________________________________  RADIOLOGY  ED MD interpretation: X-ray of the pelvis and left hip obtained to concern for fracture, results reviewed and remarkable for no evidence of acute fracture.  Healing right-sided pelvic fractures noted.  Official radiology report(s): CT PELVIS WO CONTRAST  Result Date: 08/25/2019 CLINICAL DATA:  86 year old female with fall and right hip pain. EXAM: CT PELVIS WITHOUT CONTRAST TECHNIQUE: Multidetector CT imaging of the pelvis was performed following the standard protocol without intravenous contrast. COMPARISON:  Pelvic radiograph dated 08/05/2019 and CT abdomen pelvis dated 08/14/2015. FINDINGS: Evaluation of this exam is limited in the absence of intravenous contrast. Urinary Tract: The urinary bladder is grossly unremarkable. Punctate focus of air within the bladder may be related to recent instrumentation. Clinical correlation is recommended. Bowel: There is extensive sigmoid diverticulosis without active inflammatory changes. No bowel obstruction or active inflammation within the pelvis. The appendix is normal. Vascular/Lymphatic: Advanced atherosclerotic calcification of the aorta and iliac arteries. No adenopathy. Reproductive: Small vascular calcification versus calcified fibroid in the uterus. No adnexal masses. Other:  None Musculoskeletal: There is advanced osteopenia. Subacute or old appearing fractures of the right inferior and superior pubic rami with nonunion at this time. There is extension of the right superior pubic ramus fracture into the anterior column of the right acetabulum, also likely subacute or chronic. There is fracture of the right pubic bone adjacent to the symphysis pubis, age indeterminate. Nondisplaced fracture of the left sacral ala as well as  fragmentation of the right sacral ala, likely insufficiency fractures, possibly acute. Acute appearing nondisplaced fractures of the L5 transverse process bilaterally. Status post prior internal fixation of the right femoral neck fracture with intramedullary nail. The visualized hardware appears intact. No definite acute fracture of the femoral neck. No dislocation. IMPRESSION: 1. Probable acute stress fractures of the sacral ala bilaterally as well as nondisplaced fractures of the L5 transverse processes. 2. Subacute or old appearing fractures of the right pubic bone and anterior column of the right acetabulum. 3. Status post prior internal fixation of the right femoral neck fracture with intramedullary nail. No definite acute fracture of the femoral neck. 4. Sigmoid diverticulosis. 5. Aortic Atherosclerosis (ICD10-I70.0). Electronically Signed   By: Anner Crete M.D.   On: 08/25/2019  19:53   DG Hip Unilat W or Wo Pelvis 2-3 Views Left  Result Date: 08/25/2019 CLINICAL DATA:  Fall EXAM: DG HIP (WITH OR WITHOUT PELVIS) 2-3V LEFT COMPARISON:  07/25/2019 contralateral hip FINDINGS: There is anatomic alignment at the left hip. No acute fracture. Mild degenerative changes are present. Decreased osseous mineralization. Fractures of the right pubic bone and inferior pubic ramus are again identified. Partially imaged right femur intramedullary rod fixation. IMPRESSION: No acute fracture. Electronically Signed   By: Macy Mis M.D.   On: 08/25/2019 15:29    ____________________________________________   PROCEDURES  Procedure(s) performed (including Critical Care):  Procedures   ____________________________________________   INITIAL IMPRESSION / ASSESSMENT AND PLAN / ED COURSE  86 year old woman recovering from right-sided pelvic fractures, presenting after repeat fall, with evidence of left-sided pelvic fractures requiring a medical observation admission and possible placement.  Hemodynamically  stable.  Exam with persistent pain to her left posterior pelvis, such that she refuses to walk.  She has no evidence of neurologic deficits or external signs of trauma.  She is in no distress.  Unremarkable blood work.  Plain film without evidence of acute pathology, but due to her persistent pain and refusal to walk, CT imaging obtained and with evidence of bilateral sacral alla fractures, as well as L5 transverse process fracture.  Patient reports that she lives at home alone, and has 13 steps to get to her bedroom.  Due to an unsafe discharge plan, for physical therapy and possible rehab placement, will admit the patient to hospitalist medicine for further work-up and management.  Clinical Course as of Aug 24 2040  Tue Aug 25, 2019  1910 While examining the patient, I tried to stand her up but she refuses to bear weight onto her left lower extremity.  We will CT her pelvis without contrast to further assess for fracture.   [DS]    Clinical Course User Index [DS] Vladimir Crofts, MD     ____________________________________________   FINAL CLINICAL IMPRESSION(S) / ED DIAGNOSES  Final diagnoses:  Left hip pain  Acute left-sided low back pain without sciatica  Closed fracture of transverse process of lumbar vertebra, initial encounter [facility])  Closed fracture of sacrum, unspecified portion of sacrum, initial encounter [facility])     ED Discharge Orders    None       Monai Hindes   Note:  This document was prepared using Dragon voice recognition software and may include unintentional dictation errors.   Vladimir Crofts, MD 08/25/19 2044

## 2019-08-25 NOTE — ED Notes (Addendum)
Pt taken to decon shower and placed on bedpan to urinate. Cleaned with wipes. Did not collect urine sample because was in downtime and was unaware of need to collect urine.

## 2019-08-25 NOTE — ED Triage Notes (Signed)
Pt in via EMS from home with c/o fall. Pt is recovering post pelvic fracture and has been home for 2 days and fell this am landing on her buttocks. Pt now with pain to sacral area.

## 2019-08-25 NOTE — ED Notes (Signed)
Pt back from CT

## 2019-08-25 NOTE — H&P (Addendum)
[facility] [place] NAME: [name] [name]    MR#:  [id number]  DATE OF BIRTH:  11-Oct-1929  DATE OF ADMISSION:  08/25/2019  PRIMARY CARE PHYSICIAN: [name], MD   REQUESTING/REFERRING PHYSICIAN: Vladimir Crofts, MD CHIEF COMPLAINT:   Chief Complaint  Patient presents with  . Hip Pain    HISTORY OF PRESENT ILLNESS:  [name] [name]  is a 86 y.o. Caucasian female with a known history of hypertension and dyslipidemia, who presented to the emergency room with acute onset of left hip pain after having a mechanical fall when she was moving close from the washer to the dryer and apparently lost balance.  She denied any loss of consciousness, headache or dizziness or blurred vision, paresthesias or focal muscle weakness.  No chest pain or palpitations.  No nausea or vomiting or abdominal pain.  She denied any dizziness or blurred vision or diplopia, tinnitus or vertigo.  She complains of right leg pain without swelling erythema.  Few weeks ago the patient fell and had a right pelvic fracture for which she was admitted to a skilled nursing facility after discharge.  She then went home.  Upon presentation to the emergency room, blood pressure was 152/74 with otherwise normal vital signs.  Later on her systolic blood pressure was up to 186 and her heart rate was up to 147 with respiratory to 30 and pulse ox 90 was 96% on room air with a temperature of 98.2 and a blood pressure of 126/87.  Initial EKG showed normal sinus rhythm with rate of 97 with PACs.  With her tachycardia repeat EKG revealed atrial fibrillation with rapid ventricular response with a rate of 156 with PVCs and anterolateral ST segment depression.  Labs were remarkable for a sodium of 134 with a potassium of 3.6.  Magnesium was 2.2 and CBC was within normal.  TSH came back 0.59.  COVID-19 PCR came back negative hip x-ray showed no acute fracture.  It showed her partially imaged right femur  intramedullary rod fixation.  Acute fracture of the right pubic bone and inferior pubic ramus that are again identified.  Pelvic CT scan showed the following: 1. Probable acute stress fractures of the sacral ala bilaterally as well as nondisplaced fractures of the L5 transverse processes. 2. Subacute or old appearing fractures of the right pubic bone and anterior column of the right acetabulum. 3. Status post prior internal fixation of the right femoral neck fracture with intramedullary nail. No definite acute fracture of the femoral neck. 4. Sigmoid diverticulosis. 5. Aortic Atherosclerosis  The patient was initially given Robaxin and p.o. Tylenol.  With development of atrial fibrillation with rapid ventricular sponsor she was given a bolus of IV Cardizem followed by IV Cardizem drip.  She continued to have persistent tachycardia and therefore I started on IV amiodarone with a bolus and drip.  She will be admitted to cardiac progressive unit bed for further evaluation   PAST MEDICAL HISTORY:   Past Medical History:  Diagnosis Date  . Anxiety   . Cancer ([place])    Squamous Cell Carcinoma (top of head)  . Constipation, chronic   . Deaf, left   . Diverticular disease   . Hearing aid worn   . Hearing deficit   . History of Bell's palsy    left side - resolved - 50 yrs ago  . Hypertension   . Internal hemorrhoids   . Osteopenia   . Pure hypercholesterolemia  PAST SURGICAL HISTORY:   Past Surgical History:  Procedure Laterality Date  . CATARACT EXTRACTION W/PHACO Left 11/16/2015   Procedure: CATARACT EXTRACTION PHACO AND INTRAOCULAR LENS PLACEMENT (IOC);  Surgeon: Leandrew Koyanagi, MD;  Location: [facility];  Service: Ophthalmology;  Laterality: Left;  TORIC  . CATARACT EXTRACTION W/PHACO Right 12/07/2015   Procedure: CATARACT EXTRACTION PHACO AND INTRAOCULAR LENS PLACEMENT (IOC);  Surgeon: Leandrew Koyanagi, MD;  Location: [facility];  Service:  Ophthalmology;  Laterality: Right;  TORIC  . COLONOSCOPY WITH PROPOFOL N/A 08/10/2014   Procedure: COLONOSCOPY WITH PROPOFOL;  Surgeon: Lollie Sails, MD;  Location: [facility] ENDOSCOPY;  Service: Endoscopy;  Laterality: N/A;  . FEMUR FRACTURE SURGERY    . Nasal Polyp Removal      SOCIAL HISTORY:   Social History   Tobacco Use  . Smoking status: Former Smoker    Quit date: 10/30/1985    Years since quitting: 33.8  . Smokeless tobacco: Never Used  Substance Use Topics  . Alcohol use: No    FAMILY HISTORY:  Positive for coronary artery disease and hypertension.  DRUG ALLERGIES:   Allergies  Allergen Reactions  . Bactrim [Sulfamethoxazole-Trimethoprim] Other (See Comments)    Hearing loss  . Ciprofloxacin Other (See Comments)    Flu-like symptoms  . Pneumococcal Vaccines Other (See Comments)    Flu-like symptoms    REVIEW OF SYSTEMS:   ROS As per history of present illness. All pertinent systems were reviewed above. Constitutional, HEENT, cardiovascular, respiratory, GI, GU, musculoskeletal, neuro, psychiatric, endocrine, integumentary and hematologic systems were reviewed and are otherwise negative/unremarkable except for positive findings mentioned above in the HPI.   MEDICATIONS AT HOME:   Prior to Admission medications   Medication Sig Start Date End Date Taking? Authorizing Provider  ascorbic acid (VITAMIN C) 1000 MG tablet Take 1,000 mg by mouth daily.    [provider]  b complex vitamins capsule Take 1 capsule by mouth daily.    [provider]  beta carotene 25000 UNIT capsule Take 25,000 Units by mouth daily.    [provider]  Biotin 5 MG CAPS Take 5 mg by mouth.    [provider]  calcium carbonate 1250 MG capsule Take 1,250 mg by mouth daily.    [provider]  fluticasone (FLONASE) 50 MCG/ACT nasal spray Place 2 sprays into both nostrils daily as needed for rhinitis.    [provider]  folic acid  (FOLVITE) 700 MCG tablet Take 800 mcg by mouth daily.    [provider]  GARLIC PO Take 174 mg by mouth daily.     [provider]  Glucosamine HCl (GLUCOSAMINE PO) Take 1 Dose by mouth daily.    [provider]  hydrochlorothiazide (HYDRODIURIL) 25 MG tablet Take 25 mg by mouth daily.    [provider]  HYDROcodone-acetaminophen (NORCO/VICODIN) 5-325 MG tablet Take 1 tablet by mouth every 4 (four) hours as needed for moderate pain. 07/28/19   Lorella Nimrod, MD  ibuprofen (ADVIL) 400 MG tablet Take 1 tablet (400 mg total) by mouth 3 (three) times daily. 07/28/19   Lorella Nimrod, MD  lidocaine (LIDODERM) 5 % Place 1 patch onto the skin every 12 (twelve) hours. Remove & Discard patch within 12 hours or as directed by MD 07/23/19 07/22/20  Sable Feil, PA-C  lovastatin (MEVACOR) 40 MG tablet Take 40 mg by mouth at bedtime.    [provider]  Multiple Vitamin (MULTIVITAMIN) tablet Take 1 tablet by  mouth daily.    [provider]  Multiple Vitamins-Minerals (HAIR/SKIN/NAILS PO) Take 1 tablet by mouth daily.    [provider]  polyethylene glycol (MIRALAX / GLYCOLAX) 17 g packet Take 17 g by mouth daily.     [provider]  Selenium 100 MCG TABS Take 100 mcg by mouth daily.     [provider]  tretinoin (RETIN-A) 0.05 % cream Apply 1 Dose topically at bedtime.    [provider]  verapamil (CALAN-SR) 180 MG CR tablet Take 180 mg by mouth daily.     [provider]  vitamin E 400 UNIT capsule Take 400 Units by mouth daily.    [provider]      VITAL SIGNS:  Blood pressure (!) 186/160, pulse 96, temperature 98.3 F (36.8 C), temperature source Oral, resp. rate 18, height 5\' 3"  (1.6 m), weight 56.7 kg, SpO2 99 %.  PHYSICAL EXAMINATION:  Physical Exam  GENERAL:  86 y.o.-year-old Caucasian female patient lying in the bed with no acute distress.  EYES: Pupils equal, round, reactive to  light and accommodation. No scleral icterus. Extraocular muscles intact.  HEENT: Head atraumatic, normocephalic. Oropharynx and nasopharynx clear.  NECK:  Supple, no jugular venous distention. No thyroid enlargement, no tenderness.  LUNGS: Normal breath sounds bilaterally, no wheezing, rales,rhonchi or crepitation. No use of accessory muscles of respiration.  CARDIOVASCULAR: Irregularly irregular tachycardia rhythm, S1, S2 normal. No murmurs, rubs, or gallops.  Negative Homans' sign of the right leg. ABDOMEN: Soft, nondistended, nontender. Bowel sounds present. No organomegaly or mass.  EXTREMITIES: No pedal edema, cyanosis, or clubbing.  NEUROLOGIC: Cranial nerves II through XII are intact. Muscle strength 5/5 in all extremities. Sensation intact. Gait not checked. Musculoskeletal: Pain on motion of the left hip. PSYCHIATRIC: The patient is alert and oriented x 3.  Normal affect and good eye contact. SKIN: No obvious rash, lesion, or ulcer.   LABORATORY PANEL:   CBC Recent Labs  Lab 08/25/19 1556  WBC 8.5  HGB 13.3  HCT 39.7  PLT 323   ------------------------------------------------------------------------------------------------------------------  Chemistries  Recent Labs  Lab 08/25/19 1556  NA 134*  K 3.6  CL 98  CO2 27  GLUCOSE 117*  BUN 16  CREATININE 0.59  CALCIUM 9.4   ------------------------------------------------------------------------------------------------------------------  Cardiac Enzymes No results for input(s): TROPONINI in the last 168 hours. ------------------------------------------------------------------------------------------------------------------  RADIOLOGY:  CT PELVIS WO CONTRAST  Result Date: 08/25/2019 CLINICAL DATA:  86 year old female with fall and right hip pain. EXAM: CT PELVIS WITHOUT CONTRAST TECHNIQUE: Multidetector CT imaging of the pelvis was performed following the standard protocol without intravenous contrast. COMPARISON:   Pelvic radiograph dated 08/05/2019 and CT abdomen pelvis dated 08/14/2015. FINDINGS: Evaluation of this exam is limited in the absence of intravenous contrast. Urinary Tract: The urinary bladder is grossly unremarkable. Punctate focus of air within the bladder may be related to recent instrumentation. Clinical correlation is recommended. Bowel: There is extensive sigmoid diverticulosis without active inflammatory changes. No bowel obstruction or active inflammation within the pelvis. The appendix is normal. Vascular/Lymphatic: Advanced atherosclerotic calcification of the aorta and iliac arteries. No adenopathy. Reproductive: Small vascular calcification versus calcified fibroid in the uterus. No adnexal masses. Other:  None Musculoskeletal: There is advanced osteopenia. Subacute or old appearing fractures of the right inferior and superior pubic rami with nonunion at this time. There is extension of the right superior pubic ramus fracture into the anterior column of the right acetabulum, also likely subacute or chronic. There is fracture  of the right pubic bone adjacent to the symphysis pubis, age indeterminate. Nondisplaced fracture of the left sacral ala as well as fragmentation of the right sacral ala, likely insufficiency fractures, possibly acute. Acute appearing nondisplaced fractures of the L5 transverse process bilaterally. Status post prior internal fixation of the right femoral neck fracture with intramedullary nail. The visualized hardware appears intact. No definite acute fracture of the femoral neck. No dislocation. IMPRESSION: 1. Probable acute stress fractures of the sacral ala bilaterally as well as nondisplaced fractures of the L5 transverse processes. 2. Subacute or old appearing fractures of the right pubic bone and anterior column of the right acetabulum. 3. Status post prior internal fixation of the right femoral neck fracture with intramedullary nail. No definite acute fracture of the femoral  neck. 4. Sigmoid diverticulosis. 5. Aortic Atherosclerosis (ICD10-I70.0). Electronically Signed   By: Anner Crete M.D.   On: 08/25/2019 19:53   DG Hip Unilat W or Wo Pelvis 2-3 Views Left  Result Date: 08/25/2019 CLINICAL DATA:  Fall EXAM: DG HIP (WITH OR WITHOUT PELVIS) 2-3V LEFT COMPARISON:  07/25/2019 contralateral hip FINDINGS: There is anatomic alignment at the left hip. No acute fracture. Mild degenerative changes are present. Decreased osseous mineralization. Fractures of the right pubic bone and inferior pubic ramus are again identified. Partially imaged right femur intramedullary rod fixation. IMPRESSION: No acute fracture. Electronically Signed   By: Macy Mis M.D.   On: 08/25/2019 15:29      IMPRESSION AND PLAN:   1.  New onset atrial fibrillation with rapid ventricular response. -The patient will be admitted to the progressive cardiac unit bed. -We will continue IV Cardizem drip for now and if she is still significantly tachycardic we we will add IV amiodarone bolus and drip. -Electrolytes will be optimized. -Her CHA2DS2-VASc score is 3 and therefore we will start her on p.o. Eliquis. -2D echo and a cardiology consult will be obtained in a.m. -She is a patient of her normal clinic and can be seen by Dr. Nehemiah Massed in a.m.  I notified him.  2.  Left pelvic fracture involving her sacral ala secondary to mechanical fall with associated L5 transverse process fracture.  She has left hip pain without fracture. -Pain management will be provided. -Physical therapy consult to be obtained. -Case management consult will be obtained to consider readmission to skilled nursing facility after discharge for further rehabilitation.  3.  Hypertension. -We will continue HCTZ and Calan SR.  4.  DVT prophylaxis. -The patient will be on p.o. Eliquis.  All the records are reviewed and case discussed with ED provider. The plan of care was discussed in details with the patient (and  family). I answered all questions. The patient agreed to proceed with the above mentioned plan. Further management will depend upon hospital course.   CODE STATUS: Full code  Status is: Inpatient  Remains inpatient appropriate because:Hemodynamically unstable, Ongoing active pain requiring inpatient pain management, Ongoing diagnostic testing needed not appropriate for outpatient work up, Unsafe d/c plan, IV treatments appropriate due to intensity of illness or inability to take PO and Inpatient level of care appropriate due to severity of illness   Dispo: The patient is from: Home              Anticipated d/c is to: SNF              Anticipated d/c date is: 2 days              Patient currently  is not medically stable to d/c.   TOTAL TIME TAKING CARE OF THIS PATIENT:55 minutes.    Christel Mormon M.D on 08/25/2019 at 9:38 PM  [facility]   From 7 PM-7 AM, contact night-coverage [email or web address]  CC: Primary care physician; [name], MD   Note: This dictation was prepared with Dragon dictation along with smaller phrase technology. Any transcriptional typo errors that result from this process are unintentional.

## 2019-08-25 NOTE — ED Notes (Signed)
Pt taken to CT at this time.

## 2019-08-26 ENCOUNTER — Other Ambulatory Visit: Payer: Self-pay

## 2019-08-26 DIAGNOSIS — I480 Paroxysmal atrial fibrillation: Secondary | ICD-10-CM

## 2019-08-26 DIAGNOSIS — S32456A Nondisplaced transverse fracture of unspecified acetabulum, initial encounter for closed fracture: Secondary | ICD-10-CM

## 2019-08-26 LAB — URINALYSIS, COMPLETE (UACMP) WITH MICROSCOPIC
Bilirubin Urine: NEGATIVE
Glucose, UA: NEGATIVE mg/dL
Hgb urine dipstick: NEGATIVE
Ketones, ur: 5 mg/dL — AB
Leukocytes,Ua: NEGATIVE
Nitrite: NEGATIVE
Protein, ur: NEGATIVE mg/dL
Specific Gravity, Urine: 1.011 (ref 1.005–1.030)
Squamous Epithelial / HPF: NONE SEEN (ref 0–5)
pH: 5 (ref 5.0–8.0)

## 2019-08-26 LAB — BASIC METABOLIC PANEL
Anion gap: 12 (ref 5–15)
BUN: 17 mg/dL (ref 8–23)
CO2: 27 mmol/L (ref 22–32)
Calcium: 9.1 mg/dL (ref 8.9–10.3)
Chloride: 100 mmol/L (ref 98–111)
Creatinine, Ser: 0.63 mg/dL (ref 0.44–1.00)
GFR calc Af Amer: >60 mL/min (ref >60)
GFR calc non Af Amer: >60 mL/min (ref >60)
Glucose, Bld: 109 mg/dL — ABNORMAL HIGH (ref 70–99)
Potassium: 3.9 mmol/L (ref 3.5–5.1)
Sodium: 139 mmol/L (ref 135–145)

## 2019-08-26 LAB — CBC
HCT: 38.9 % (ref 36.0–46.0)
Hemoglobin: 12.7 g/dL (ref 12.0–15.0)
MCH: 30.4 pg (ref 26.0–34.0)
MCHC: 32.6 g/dL (ref 30.0–36.0)
MCV: 93.1 fL (ref 80.0–100.0)
Platelets: 234 10*3/uL (ref 150–400)
RBC: 4.18 MIL/uL (ref 3.87–5.11)
RDW: 13.5 % (ref 11.5–15.5)
WBC: 8.2 10*3/uL (ref 4.0–10.5)
nRBC: 0 % (ref 0.0–0.2)

## 2019-08-26 LAB — MRSA PCR SCREENING: MRSA by PCR: POSITIVE — AB

## 2019-08-26 MED ORDER — ONDANSETRON HCL 4 MG/2ML IJ SOLN: 4.0000 mg | Freq: Four times a day (QID) | INTRAMUSCULAR | Status: DC | PRN

## 2019-08-26 MED ORDER — SODIUM CHLORIDE 0.9 % IV SOLN: INTRAVENOUS | Status: DC

## 2019-08-26 MED ORDER — MUPIROCIN 2 % EX OINT
1.0000 "application " | TOPICAL_OINTMENT | Freq: Two times a day (BID) | CUTANEOUS | Status: DC
  Administered 2019-08-27 – 2019-08-28 (×3): 1 via NASAL
  Filled 2019-08-26: qty 22

## 2019-08-26 MED ORDER — ACETAMINOPHEN 325 MG PO TABS: 650.0000 mg | ORAL_TABLET | ORAL | Status: DC | PRN

## 2019-08-26 MED ORDER — APIXABAN 2.5 MG PO TABS
2.5000 mg | ORAL_TABLET | Freq: Two times a day (BID) | ORAL | Status: DC
  Administered 2019-08-26 – 2019-08-28 (×5): 2.5 mg via ORAL
  Filled 2019-08-26 (×8): qty 1

## 2019-08-26 MED ORDER — ZOLPIDEM TARTRATE 5 MG PO TABS: 5.0000 mg | ORAL_TABLET | Freq: Every evening | ORAL | Status: DC | PRN

## 2019-08-26 MED ORDER — AMIODARONE HCL IN DEXTROSE 360-4.14 MG/200ML-% IV SOLN: 30.0000 mg/h | INTRAVENOUS | Status: DC

## 2019-08-26 MED ORDER — ASPIRIN EC 81 MG PO TBEC
81.0000 mg | DELAYED_RELEASE_TABLET | Freq: Every day | ORAL | Status: DC
  Administered 2019-08-26 – 2019-08-28 (×3): 81 mg via ORAL
  Filled 2019-08-26 (×3): qty 1

## 2019-08-26 MED ORDER — CHLORHEXIDINE GLUCONATE CLOTH 2 % EX PADS
6.0000 | MEDICATED_PAD | Freq: Every day | CUTANEOUS | Status: DC
  Administered 2019-08-27: 6 via TOPICAL

## 2019-08-26 MED ORDER — AMIODARONE HCL IN DEXTROSE 360-4.14 MG/200ML-% IV SOLN: 60.0000 mg/h | INTRAVENOUS | Status: DC

## 2019-08-26 MED ORDER — AMIODARONE LOAD VIA INFUSION
150.0000 mg | Freq: Once | INTRAVENOUS | Status: DC
  Filled 2019-08-26: qty 83.34

## 2019-08-26 MED ORDER — DILTIAZEM HCL-DEXTROSE 125-5 MG/125ML-% IV SOLN (PREMIX)
5.0000 mg/h | INTRAVENOUS | Status: DC
  Administered 2019-08-26 (×2): 5 mg/h via INTRAVENOUS
  Filled 2019-08-26: qty 125

## 2019-08-26 MED ORDER — FAMOTIDINE 20 MG PO TABS
20.0000 mg | ORAL_TABLET | Freq: Every day | ORAL | Status: DC
  Administered 2019-08-26 – 2019-08-28 (×3): 20 mg via ORAL
  Filled 2019-08-26 (×3): qty 1

## 2019-08-26 MED ORDER — ALPRAZOLAM 0.25 MG PO TABS: 0.2500 mg | ORAL_TABLET | Freq: Two times a day (BID) | ORAL | Status: DC | PRN

## 2019-08-26 NOTE — Progress Notes (Signed)
Pt's cardizem gtt is infusing at 5. VSS, HR in 80-90s , a-fib. I will continue to assess.

## 2019-08-26 NOTE — Evaluation (Signed)
Physical Therapy Evaluation Patient Details Name: [name] [name] MRN: [id number] DOB: 24-Mar-1929 Today's Date: 08/26/2019   History of Present Illness  86 y/o female admitted after fall while doing laundry. Patient with prior fall one month ago with right LE pain, now left hip pain with imaging showing sacral ala fracture and L5 fracture.  Pt with previous closed fx of R inferior pubic ramus. Chief complain is inability to bear weight on RLE since fall. PMH includes HTN, HLD, anxiety, squamous cell carconoma (top of head), deaf in L ear, and osteopenia. Per Dr. Posey Pronto from orthopedics, pt ok for WBAT.  Clinical Impression  Patient received resting in bed. Reports she is cold. Agrees to mobility as she states she may need to go to the bathroom. Patient is mostly mod independent with bed mobility with occasional min guard. She is pain limited. She is able to transfer to [facility] with min/mod assist. She will continue to benefit from skilled PT while here to improve functional mobility and safety.    Follow Up Recommendations SNF    Equipment Recommendations  None recommended by PT    Recommendations for Other Services       Precautions / Restrictions Precautions Precautions: Fall Restrictions Weight Bearing Restrictions: No Other Position/Activity Restrictions: WBAT      Mobility  Bed Mobility Overal bed mobility: Needs Assistance Bed Mobility: Supine to Sit;Sit to Supine     Supine to sit: Min guard Sit to supine: Min guard   General bed mobility comments: mostly mod independent, limited due to pain  Transfers Overall transfer level: Needs assistance Equipment used: None Transfers: Sit to/from [facility] Sit to Stand: Min assist Stand pivot transfers: Min assist       General transfer comment: patient able to stand with min assist and transfer to [facility] and then back to bed. Pain limited. Pain with sitting on BSC.  Ambulation/Gait              General Gait Details: not assessed due to pain  Stairs            Wheelchair Mobility    Modified Rankin (Stroke Patients Only)       Balance Overall balance assessment: Needs assistance Sitting-balance support: Feet supported Sitting balance-Leahy Scale: Fair Sitting balance - Comments: pain limited   Standing balance support: Bilateral upper extremity supported;During functional activity Standing balance-Leahy Scale: Poor Standing balance comment: requires assistance due to pain with WB                             Pertinent Vitals/Pain Pain Assessment: Faces Faces Pain Scale: Hurts whole lot Pain Location: L hip Pain Descriptors / Indicators: Grimacing;Guarding;Discomfort Pain Intervention(s): Monitored during session;Repositioned    Home Living Family/patient expects to be discharged to:: Private residence Living Arrangements: Alone   Type of Home: House Home Access: Stairs to enter Entrance Stairs-Rails: Can reach both;Right;Left Entrance Stairs-Number of Steps: 4 Home Layout: Multi-level;1/2 bath on main level;Bed/bath upstairs Home Equipment: Walker - 2 wheels;Cane - single point;Grab bars - tub/shower;Grab bars - toilet Additional Comments: pt reports that her bed is upstairs however does state that if necessary to sleep downstairs, she has a recliner chair that she may be able to use    Prior Function Level of Independence: Independent         Comments: Patient reports she recently began using walker. History of multiple falls.     Hand Dominance  Extremity/Trunk Assessment   Upper Extremity Assessment Upper Extremity Assessment: Overall WFL for tasks assessed    Lower Extremity Assessment Lower Extremity Assessment: Overall WFL for tasks assessed    Cervical / Trunk Assessment Cervical / Trunk Assessment: Normal  Communication   Communication: HOH  Cognition Arousal/Alertness: Awake/alert Behavior During Therapy:  WFL for tasks assessed/performed Overall Cognitive Status: Within Functional Limits for tasks assessed                                        General Comments      Exercises     Assessment/Plan    PT Assessment Patient needs continued PT services  PT Problem List Decreased strength;Decreased mobility;Decreased activity tolerance;Decreased balance;Pain       PT Treatment Interventions DME instruction;Therapeutic activities;Gait training;Therapeutic exercise;Patient/family education;Stair training;Balance training;Functional mobility training;Neuromuscular re-education    PT Goals (Current goals can be found in the Care Plan section)  Acute Rehab PT Goals Patient Stated Goal: to improve pain PT Goal Formulation: With patient Time For Goal Achievement: 09/10/19 Potential to Achieve Goals: Good    Frequency 7X/week   Barriers to discharge Decreased caregiver support;Inaccessible home environment      Co-evaluation               AM-PAC PT "6 Clicks" Mobility  Outcome Measure Help needed turning from your back to your side while in a flat bed without using bedrails?: A Little Help needed moving from lying on your back to sitting on the side of a flat bed without using bedrails?: A Little Help needed moving to and from a bed to a chair (including a wheelchair)?: A Little Help needed standing up from a chair using your arms (e.g., wheelchair or bedside chair)?: A Little Help needed to walk in hospital room?: A Lot Help needed climbing 3-5 steps with a railing? : A Lot 6 Click Score: 16    End of Session Equipment Utilized During Treatment: Gait belt Activity Tolerance: Patient limited by pain Patient left: in bed;with call bell/phone within reach;with bed alarm set;with nursing/sitter in room Nurse Communication: Mobility status PT Visit Diagnosis: Muscle weakness (generalized) (M62.81);Other abnormalities of gait and mobility (R26.89);Repeated falls  (R29.6);Difficulty in walking, not elsewhere classified (R26.2);Pain;Unsteadiness on feet (R26.81) Pain - Right/Left: Left Pain - part of body: Hip    Time: 4315-4008 PT Time Calculation (min) (ACUTE ONLY): 19 min   Charges:   PT Evaluation $PT Eval Moderate Complexity: 1 Mod PT Treatments $Therapeutic Activity: 8-22 mins        Tijuana Scheidegger, PT, GCS 08/26/19,11:17 AM

## 2019-08-26 NOTE — Progress Notes (Signed)
PROGRESS NOTE    [name] [name]  OVF:[id number] DOB: Jul 02, 1929 DOA: 08/25/2019 PCP: [name], MD   Chief complaint. Hip pain. Brief Narrative:  [name] [name]  is a 86 y.o. Caucasian female with a known history of hypertension and dyslipidemia, who presented to the emergency room with acute onset of left hip pain after having a mechanical fall when she was moving close from the washer to the dryer and apparently lost balance.  Upon arriving the emergency room, she was found to have atrial fibrillation with rapid ventricle response. Pelvic CT scan showed bilateral pelvic fracture.  7/28. Patient was seen by cardiology, amiodarone was switched to oral. Heart rate better controlled with diltiazem.  Assessment & Plan:   Active Problems:   Pelvic fracture (HCC)   Atrial fibrillation ([place])  #1. Acute onset atrial fibrillation with rapid major response. Appreciate cardiology consult. Continue high dose of amiodarone. Continue diltiazem drip. Patient is also started on anticoagulation. Cardiology is considering cardioversion tomorrow if not converted spontaneously.  2. Pelvic fractures. Continue symptomatic treatment. CT scan did not show any new hip fracture. Continue physical therapy/Occupational Therapy.  3. Essential hypertension. Continue diltiazem.     DVT prophylaxis: Eliquis Code Status: Full Family Communication: None Disposition Plan:  . Patient came from: Home            . Anticipated d/c place: . Barriers to d/c OR conditions which need to be met to effect a safe d/c:   Consultants:   Cardiology  Procedures: None Antimicrobials:None   Subjective: Patient doing better today. She is very hard of hearing. Pelvic pain is controlled. Denies any short of breath. No palpitation. No fever chills. No nausea vomiting abdominal pain.  Objective: Vitals:   08/26/19 0458 08/26/19 0528 08/26/19 0558 08/26/19 1147  BP: (!) 124/55 (!) 150/63 (!) 137/82 (!)  147/67  Pulse: 76 69 71 76  Resp: 15 17 15 18   Temp:    98.1 F (36.7 C)  TempSrc:    Oral  SpO2: 97% 93% 94% 99%  Weight:      Height:       No intake or output data in the 24 hours ending 08/26/19 1223 Filed Weights   08/25/19 1442  Weight: 56.7 kg    Examination:  General exam: Appears calm and comfortable  Respiratory system: Clear to auscultation. Respiratory effort normal. Cardiovascular system: Irregular. No JVD, murmurs, rubs, gallops or clicks. No pedal edema. Gastrointestinal system: Abdomen is nondistended, soft and nontender. No organomegaly or masses felt. Normal bowel sounds heard. Central nervous system: Alert and oriented. No focal neurological deficits. Extremities: Symmetric 5 x 5 power. Skin: No rashes, lesions or ulcers Psychiatry: Judgement and insight appear normal. Mood & affect appropriate.     Data Reviewed: I have personally reviewed following labs and imaging studies  CBC: Recent Labs  Lab 08/25/19 1556 08/26/19 0458  WBC 8.5 8.2  HGB 13.3 12.7  HCT 39.7 38.9  MCV 91.1 93.1  PLT 323 841   Basic Metabolic Panel: Recent Labs  Lab 08/25/19 1556 08/26/19 0458  NA 134* 139  K 3.6 3.9  CL 98 100  CO2 27 27  GLUCOSE 117* 109*  BUN 16 17  CREATININE 0.59 0.63  CALCIUM 9.4 9.1  MG 2.2  --    GFR: Estimated Creatinine Clearance: 40.2 mL/min (by C-G formula based on SCr of 0.63 mg/dL). Liver Function Tests: No results for input(s): AST, ALT, ALKPHOS, BILITOT, PROT, ALBUMIN in the last 168 hours. No  results for input(s): LIPASE, AMYLASE in the last 168 hours. No results for input(s): AMMONIA in the last 168 hours. Coagulation Profile: No results for input(s): INR, PROTIME in the last 168 hours. Cardiac Enzymes: No results for input(s): CKTOTAL, CKMB, CKMBINDEX, TROPONINI in the last 168 hours. BNP (last 3 results) No results for input(s): PROBNP in the last 8760 hours. HbA1C: No results for input(s): HGBA1C in the last 72  hours. CBG: No results for input(s): GLUCAP in the last 168 hours. Lipid Profile: No results for input(s): CHOL, HDL, LDLCALC, TRIG, CHOLHDL, LDLDIRECT in the last 72 hours. Thyroid Function Tests: Recent Labs    08/25/19 1556  TSH 0.593   Anemia Panel: No results for input(s): VITAMINB12, FOLATE, FERRITIN, TIBC, IRON, RETICCTPCT in the last 72 hours. Sepsis Labs: No results for input(s): PROCALCITON, LATICACIDVEN in the last 168 hours.  Recent Results (from the past 240 hour(s))  SARS Coronavirus 2 by RT PCR (hospital order, performed in [facility] hospital lab) Nasopharyngeal Nasopharyngeal Swab     Status: None   Collection Time: 08/25/19  9:52 PM   Specimen: Nasopharyngeal Swab  Result Value Ref Range Status   SARS Coronavirus 2 NEGATIVE NEGATIVE Final    Comment: (NOTE) SARS-CoV-2 target nucleic acids are NOT DETECTED.  The SARS-CoV-2 RNA is generally detectable in upper and lower respiratory specimens during the acute phase of infection. The lowest concentration of SARS-CoV-2 viral copies this assay can detect is 250 copies / mL. A negative result does not preclude SARS-CoV-2 infection and should not be used as the sole basis for treatment or other patient management decisions.  A negative result may occur with improper specimen collection / handling, submission of specimen other than nasopharyngeal swab, presence of viral mutation(s) within the areas targeted by this assay, and inadequate number of viral copies (<250 copies / mL). A negative result must be combined with clinical observations, patient history, and epidemiological information.  Fact Sheet for Patients:   StrictlyIdeas.no  Fact Sheet for Healthcare Providers: BankingDealers.co.za  This test is not yet approved or  cleared by the [place] FDA and has been authorized for detection and/or diagnosis of SARS-CoV-2 by FDA under an Emergency Use  Authorization (EUA).  This EUA will remain in effect (meaning this test can be used) for the duration of the COVID-19 declaration under Section 564(b)(1) of the Act, 21 U.S.C. section 360bbb-3(b)(1), unless the authorization is terminated or revoked sooner.  Performed at [facility], [address], [place], [place] [address]          Radiology Studies: CT PELVIS WO CONTRAST  Result Date: 08/25/2019 CLINICAL DATA:  86 year old female with fall and right hip pain. EXAM: CT PELVIS WITHOUT CONTRAST TECHNIQUE: Multidetector CT imaging of the pelvis was performed following the standard protocol without intravenous contrast. COMPARISON:  Pelvic radiograph dated 08/05/2019 and CT abdomen pelvis dated 08/14/2015. FINDINGS: Evaluation of this exam is limited in the absence of intravenous contrast. Urinary Tract: The urinary bladder is grossly unremarkable. Punctate focus of air within the bladder may be related to recent instrumentation. Clinical correlation is recommended. Bowel: There is extensive sigmoid diverticulosis without active inflammatory changes. No bowel obstruction or active inflammation within the pelvis. The appendix is normal. Vascular/Lymphatic: Advanced atherosclerotic calcification of the aorta and iliac arteries. No adenopathy. Reproductive: Small vascular calcification versus calcified fibroid in the uterus. No adnexal masses. Other:  None Musculoskeletal: There is advanced osteopenia. Subacute or old appearing fractures of the right inferior and superior pubic  rami with nonunion at this time. There is extension of the right superior pubic ramus fracture into the anterior column of the right acetabulum, also likely subacute or chronic. There is fracture of the right pubic bone adjacent to the symphysis pubis, age indeterminate. Nondisplaced fracture of the left sacral ala as well as fragmentation of the right sacral ala, likely insufficiency fractures, possibly acute. Acute  appearing nondisplaced fractures of the L5 transverse process bilaterally. Status post prior internal fixation of the right femoral neck fracture with intramedullary nail. The visualized hardware appears intact. No definite acute fracture of the femoral neck. No dislocation. IMPRESSION: 1. Probable acute stress fractures of the sacral ala bilaterally as well as nondisplaced fractures of the L5 transverse processes. 2. Subacute or old appearing fractures of the right pubic bone and anterior column of the right acetabulum. 3. Status post prior internal fixation of the right femoral neck fracture with intramedullary nail. No definite acute fracture of the femoral neck. 4. Sigmoid diverticulosis. 5. Aortic Atherosclerosis (ICD10-I70.0). Electronically Signed   By: Anner Crete M.D.   On: 08/25/2019 19:53   DG Hip Unilat W or Wo Pelvis 2-3 Views Left  Result Date: 08/25/2019 CLINICAL DATA:  Fall EXAM: DG HIP (WITH OR WITHOUT PELVIS) 2-3V LEFT COMPARISON:  07/25/2019 contralateral hip FINDINGS: There is anatomic alignment at the left hip. No acute fracture. Mild degenerative changes are present. Decreased osseous mineralization. Fractures of the right pubic bone and inferior pubic ramus are again identified. Partially imaged right femur intramedullary rod fixation. IMPRESSION: No acute fracture. Electronically Signed   By: Macy Mis M.D.   On: 08/25/2019 15:29        Scheduled Meds: . amiodarone  150 mg Intravenous Once  . apixaban  2.5 mg Oral BID  . ascorbic acid  1,000 mg Oral Daily  . aspirin EC  81 mg Oral Daily  . calcium carbonate  1,250 mg Oral Daily  . famotidine  20 mg Oral Daily  . folic acid  3,202 mcg Oral Daily  . hydrochlorothiazide  25 mg Oral Daily  . lidocaine  1 patch Transdermal Q24H  . multivitamin with minerals  1 tablet Oral Daily  . polyethylene glycol  17 g Oral Daily  . potassium chloride  40 mEq Oral Once  . pravastatin  40 mg Oral q1800  . vitamin E  400 Units  Oral Daily   Continuous Infusions: . amiodarone    . diltiazem (CARDIZEM) infusion 5 mg/hr (08/26/19 0212)     LOS: 1 day    Time spent: 28 minutes    Sharen Hones, MD [facility]   To contact the attending provider between 7A-7P or the covering provider during after hours 7P-7A, please log into the web site [email or web address] and access using universal [place] password for that web site. If you do not have the password, please call the hospital operator.  08/26/2019, 12:23 PM

## 2019-08-26 NOTE — Consult Note (Signed)
[facility] Cardiology Consultation Note  Patient ID: [name] [name], MRN: [id number], DOB/AGE: Jul 23, 1929 86 y.o. Admit date: 08/25/2019   Date of Consult: 08/26/2019 Primary Physician: [name], MD Primary Cardiologist: None  Chief Complaint:  Chief Complaint  Patient presents with  . Hip Pain   Reason for Consult: A. fib  HPI: 86 y.o. female with known significant overall deterioration of wellbeing over the last several months with multiple mechanical falls for which the patient has loss of balance.  The patient appears not to have had any syncope over the last several months but has had some pelvic and low back fractures.  The patient has been in rehabilitation for which she had graduated but then had another fall prompting this admission to the hospital.  At that time it was incidentally found that the patient did have atrial fibrillation with rapid ventricular rate.  At that time the patient has not had any myocardial infarction congestive heart failure anginal symptoms or other significant cardiovascular concerns.  The atrial fibrillation with rapid ventricular rate where is treated with diltiazem as well as amiodarone drip for which heart rate control is much more improved at this time and appears to have possibly converted to normal sinus rhythm.  The patient feels well and is and hemodynamically stable at this time  Past Medical History:  Diagnosis Date  . Anxiety   . Cancer ([place])    Squamous Cell Carcinoma (top of head)  . Constipation, chronic   . Deaf, left   . Diverticular disease   . Hearing aid worn   . Hearing deficit   . History of Bell's palsy    left side - resolved - 50 yrs ago  . Hypertension   . Internal hemorrhoids   . Osteopenia   . Pure hypercholesterolemia       Surgical History:  Past Surgical History:  Procedure Laterality Date  . CATARACT EXTRACTION W/PHACO Left 11/16/2015   Procedure: CATARACT EXTRACTION PHACO AND INTRAOCULAR LENS  PLACEMENT (IOC);  Surgeon: Leandrew Koyanagi, MD;  Location: [facility];  Service: Ophthalmology;  Laterality: Left;  TORIC  . CATARACT EXTRACTION W/PHACO Right 12/07/2015   Procedure: CATARACT EXTRACTION PHACO AND INTRAOCULAR LENS PLACEMENT (IOC);  Surgeon: Leandrew Koyanagi, MD;  Location: [facility];  Service: Ophthalmology;  Laterality: Right;  TORIC  . COLONOSCOPY WITH PROPOFOL N/A 08/10/2014   Procedure: COLONOSCOPY WITH PROPOFOL;  Surgeon: Lollie Sails, MD;  Location: [facility] ENDOSCOPY;  Service: Endoscopy;  Laterality: N/A;  . FEMUR FRACTURE SURGERY    . Nasal Polyp Removal       Home Meds: Prior to Admission medications   Medication Sig Start Date End Date Taking? Authorizing Provider  ascorbic acid (VITAMIN C) 1000 MG tablet Take 1,000 mg by mouth daily.    [provider]  b complex vitamins capsule Take 1 capsule by mouth daily.    [provider]  beta carotene 25000 UNIT capsule Take 25,000 Units by mouth daily.    [provider]  Biotin 5 MG CAPS Take 5 mg by mouth.    [provider]  calcium carbonate 1250 MG capsule Take 1,250 mg by mouth daily.    [provider]  fluticasone (FLONASE) 50 MCG/ACT nasal spray Place 2 sprays into both nostrils daily as needed for rhinitis.    [provider]  folic acid (FOLVITE) 485 MCG tablet Take 800 mcg by mouth daily.    [provider]  GARLIC PO Take 462  mg by mouth daily.     [provider]  Glucosamine HCl (GLUCOSAMINE PO) Take 1 Dose by mouth daily.    [provider]  hydrochlorothiazide (HYDRODIURIL) 25 MG tablet Take 25 mg by mouth daily.    [provider]  HYDROcodone-acetaminophen (NORCO/VICODIN) 5-325 MG tablet Take 1 tablet by mouth every 4 (four) hours as needed for moderate pain. 07/28/19   Lorella Nimrod, MD  ibuprofen (ADVIL) 400 MG tablet Take 1 tablet (400 mg total) by mouth 3 (three) times daily. 07/28/19    Lorella Nimrod, MD  lidocaine (LIDODERM) 5 % Place 1 patch onto the skin every 12 (twelve) hours. Remove & Discard patch within 12 hours or as directed by MD 07/23/19 07/22/20  Sable Feil, PA-C  lovastatin (MEVACOR) 40 MG tablet Take 40 mg by mouth at bedtime.    [provider]  Multiple Vitamin (MULTIVITAMIN) tablet Take 1 tablet by mouth daily.    [provider]  Multiple Vitamins-Minerals (HAIR/SKIN/NAILS PO) Take 1 tablet by mouth daily.    [provider]  polyethylene glycol (MIRALAX / GLYCOLAX) 17 g packet Take 17 g by mouth daily.     [provider]  Selenium 100 MCG TABS Take 100 mcg by mouth daily.     [provider]  tretinoin (RETIN-A) 0.05 % cream Apply 1 Dose topically at bedtime.    [provider]  verapamil (CALAN-SR) 180 MG CR tablet Take 180 mg by mouth daily.     [provider]  vitamin E 400 UNIT capsule Take 400 Units by mouth daily.    [provider]    Inpatient Medications:  . amiodarone  150 mg Intravenous Once  . apixaban  2.5 mg Oral BID  . ascorbic acid  1,000 mg Oral Daily  . aspirin EC  81 mg Oral Daily  . calcium carbonate  1,250 mg Oral Daily  . famotidine  20 mg Oral Daily  . folic acid  9,242 mcg Oral Daily  . hydrochlorothiazide  25 mg Oral Daily  . lidocaine  1 patch Transdermal Q24H  . multivitamin with minerals  1 tablet Oral Daily  . polyethylene glycol  17 g Oral Daily  . potassium chloride  40 mEq Oral Once  . pravastatin  40 mg Oral q1800  . vitamin E  400 Units Oral Daily   . sodium chloride 100 mL/hr at 08/26/19 0531  . amiodarone    . diltiazem (CARDIZEM) infusion 5 mg/hr (08/26/19 6834)    Allergies:  Allergies  Allergen Reactions  . Bactrim [Sulfamethoxazole-Trimethoprim] Other (See Comments)    Hearing loss  . Ciprofloxacin Other (See Comments)    Flu-like symptoms  . Pneumococcal Vaccines Other (See Comments)    Flu-like symptoms    Social  History   Socioeconomic History  . Marital status: Widowed    Spouse name: Not on file  . Number of children: Not on file  . Years of education: Not on file  . Highest education level: Not on file  Occupational History  . Not on file  Tobacco Use  . Smoking status: Former Smoker    Quit date: 10/30/1985    Years since quitting: 33.8  . Smokeless tobacco: Never Used  Substance and Sexual Activity  . Alcohol use: No  . Drug use: No  . Sexual activity: Not on file  Other Topics Concern  . Not on file  Social History Narrative  . Not on file   Social  Determinants of Health   Financial Resource Strain:   . Difficulty of Paying Living Expenses:   Food Insecurity:   . Worried About Charity fundraiser in the Last Year:   . Arboriculturist in the Last Year:   Transportation Needs:   . Film/video editor (Medical):   Marland Kitchen Lack of Transportation (Non-Medical):   Physical Activity:   . Days of Exercise per Week:   . Minutes of Exercise per Session:   Stress:   . Feeling of Stress :   Social Connections:   . Frequency of Communication with Friends and Family:   . Frequency of Social Gatherings with Friends and Family:   . Attends Religious Services:   . Active Member of Clubs or Organizations:   . Attends Archivist Meetings:   Marland Kitchen Marital Status:   Intimate Partner Violence:   . Fear of Current or Ex-Partner:   . Emotionally Abused:   Marland Kitchen Physically Abused:   . Sexually Abused:      History reviewed. No pertinent family history.   Review of Systems Positive for leg pain Negative for: General:  chills, fever, night sweats or weight changes.  Cardiovascular: PND orthopnea syncope positive for dizziness  Dermatological skin lesions rashes Respiratory: Cough congestion Urologic: Frequent urination urination at night and hematuria Abdominal: negative for nausea, vomiting, diarrhea, bright red blood per rectum, melena, or hematemesis Neurologic: negative for  visual changes, and/or hearing changes  All other systems reviewed and are otherwise negative except as noted above.  Labs: No results for input(s): CKTOTAL, CKMB, TROPONINI in the last 72 hours. Lab Results  Component Value Date   WBC 8.2 08/26/2019   HGB 12.7 08/26/2019   HCT 38.9 08/26/2019   MCV 93.1 08/26/2019   PLT 234 08/26/2019    Recent Labs  Lab 08/26/19 0458  NA 139  K 3.9  CL 100  CO2 27  BUN 17  CREATININE 0.63  CALCIUM 9.1  GLUCOSE 109*   No results found for: CHOL, HDL, LDLCALC, TRIG No results found for: DDIMER  Radiology/Studies:  CT PELVIS WO CONTRAST  Result Date: 08/25/2019 CLINICAL DATA:  86 year old female with fall and right hip pain. EXAM: CT PELVIS WITHOUT CONTRAST TECHNIQUE: Multidetector CT imaging of the pelvis was performed following the standard protocol without intravenous contrast. COMPARISON:  Pelvic radiograph dated 08/05/2019 and CT abdomen pelvis dated 08/14/2015. FINDINGS: Evaluation of this exam is limited in the absence of intravenous contrast. Urinary Tract: The urinary bladder is grossly unremarkable. Punctate focus of air within the bladder may be related to recent instrumentation. Clinical correlation is recommended. Bowel: There is extensive sigmoid diverticulosis without active inflammatory changes. No bowel obstruction or active inflammation within the pelvis. The appendix is normal. Vascular/Lymphatic: Advanced atherosclerotic calcification of the aorta and iliac arteries. No adenopathy. Reproductive: Small vascular calcification versus calcified fibroid in the uterus. No adnexal masses. Other:  None Musculoskeletal: There is advanced osteopenia. Subacute or old appearing fractures of the right inferior and superior pubic rami with nonunion at this time. There is extension of the right superior pubic ramus fracture into the anterior column of the right acetabulum, also likely subacute or chronic. There is fracture of the right pubic bone  adjacent to the symphysis pubis, age indeterminate. Nondisplaced fracture of the left sacral ala as well as fragmentation of the right sacral ala, likely insufficiency fractures, possibly acute. Acute appearing nondisplaced fractures of the L5 transverse process bilaterally. Status post prior internal fixation of  the right femoral neck fracture with intramedullary nail. The visualized hardware appears intact. No definite acute fracture of the femoral neck. No dislocation. IMPRESSION: 1. Probable acute stress fractures of the sacral ala bilaterally as well as nondisplaced fractures of the L5 transverse processes. 2. Subacute or old appearing fractures of the right pubic bone and anterior column of the right acetabulum. 3. Status post prior internal fixation of the right femoral neck fracture with intramedullary nail. No definite acute fracture of the femoral neck. 4. Sigmoid diverticulosis. 5. Aortic Atherosclerosis (ICD10-I70.0). Electronically Signed   By: Anner Crete M.D.   On: 08/25/2019 19:53   DG Hip Unilat W or Wo Pelvis 2-3 Views Left  Result Date: 08/25/2019 CLINICAL DATA:  Fall EXAM: DG HIP (WITH OR WITHOUT PELVIS) 2-3V LEFT COMPARISON:  07/25/2019 contralateral hip FINDINGS: There is anatomic alignment at the left hip. No acute fracture. Mild degenerative changes are present. Decreased osseous mineralization. Fractures of the right pubic bone and inferior pubic ramus are again identified. Partially imaged right femur intramedullary rod fixation. IMPRESSION: No acute fracture. Electronically Signed   By: Macy Mis M.D.   On: 08/25/2019 15:29    EKG: Atrial fibrillation with rapid ventricular rate nonspecific ST changes  Weights: Filed Weights   08/25/19 1442  Weight: 56.7 kg     Physical Exam: Blood pressure (!) 137/82, pulse 71, temperature 98.2 F (36.8 C), temperature source Oral, resp. rate 15, height 5\' 3"  (1.6 m), weight 56.7 kg, SpO2 94 %. Body mass index is 22.14  kg/m. General: Well developed, well nourished, in no acute distress. Head eyes ears nose throat: Normocephalic, atraumatic, sclera non-icteric, no xanthomas, nares are without discharge. No apparent thyromegaly and/or mass  Lungs: Normal respiratory effort.  no wheezes, no rales, no rhonchi.  Heart: Slightly irregular with normal S1 S2. no murmur gallop, no rub, PMI is normal size and placement, carotid upstroke normal without bruit, jugular venous pressure is normal Abdomen: Soft, non-tender, non-distended with normoactive bowel sounds. No hepatomegaly. No rebound/guarding. No obvious abdominal masses. Abdominal aorta is normal size without bruit Extremities: Trace edema. no cyanosis, no clubbing, no ulcers  Peripheral : 2+ bilateral upper extremity pulses, 2+ bilateral femoral pulses, 2+ bilateral dorsal pedal pulse Neuro: Alert and oriented. No facial asymmetry. No focal deficit. Moves all extremities spontaneously. Musculoskeletal: Normal muscle tone with kyphosis Psych:  Responds to questions appropriately with a normal affect.    Assessment: 86 year old female with significant recent unsteadiness and falling with fractures and weakness having atrial fibrillation with rapid ventricular rate and no current evidence of congestive heart failure or myocardial infarction  Plan: 1.  Continue amiodarone drip throughout this afternoon until a.m. for load of amiodarone and would consider the possibility of continuation tomorrow with 200 mg of amiodarone twice per day 2.  Continue diltiazem drip at this time for heart rate control and possible maintenance of normal sinus rhythm until a.m. if cardioverted into normal sinus rhythm would use 120 mg Cardizem CD 3.  Further consideration of anticoagulation after discussion with family members and/or following for significant fall risk and risks for bleeding complications.  For now would abstain from anticoagulation due to these concerns 4.  No further  cardiac diagnostics necessary at this time due to patient having no evidence of significant myocardial infarction or congestive heart failure  Signed, Corey Skains M.D. [facility] 08/26/2019, 8:39 AM

## 2019-08-26 NOTE — Evaluation (Signed)
Occupational Therapy Evaluation Patient Details Name: [name] [name] MRN: [id number] DOB: 1929-04-29 Today's Date: 08/26/2019    History of Present Illness 86 y/o female admitted after fall while doing laundry. Patient with prior fall one month ago with right LE pain, now left hip pain with imaging showing sacral ala fracture and L5 fracture.  Pt with previous closed fx of R inferior pubic ramus. Chief complain is inability to bear weight on RLE since fall. PMH includes HTN, HLD, anxiety, squamous cell carconoma (top of head), deaf in L ear, and osteopenia. Per Dr. Posey Pronto from orthopedics, pt ok for WBAT.   Clinical Impression   Ms Rincon was seen for OT evaluation this date. Prior to hospital admission, pt was MOD I for mobility/ADLs using RW. Pt lives alone and reports hx of falls. Pt presents to acute OT demonstrating impaired ADL performance and functional mobility 2/2 decreased safety awareness, functional strength/balance/ROM deficits, decreased LB access, and pain. Pt currently requires CGA sup>sit c R lateral lean in sitting 2/2 L hip pain (7/10, RN notified). MIN A squat pivot t/f bed>chair. MOD I self-drinking/feeding seated in chair. Pt would benefit from skilled OT to address noted impairments and functional limitations (see below for any additional details) in order to maximize safety and independence while minimizing falls risk and caregiver burden. Upon hospital discharge, recommend STR to maximize pt safety and return to PLOF.     Follow Up Recommendations  SNF    Equipment Recommendations   (TBD)    Recommendations for Other Services       Precautions / Restrictions Precautions Precautions: Fall Restrictions Weight Bearing Restrictions: Yes LLE Weight Bearing: Weight bearing as tolerated      Mobility Bed Mobility Overal bed mobility: Needs Assistance Bed Mobility: Supine to Sit     Supine to sit: Min guard Sit to supine: HOB elevated   General bed  mobility comments: limited 2/2 pain in sitting, R lean  Transfers Overall transfer level: Needs assistance Equipment used: None Transfers: Squat Pivot Transfers     Squat pivot transfers: Min assist     General transfer comment: Pain limited for standing, MIN A squat pivot t/f bed>chair    Balance Overall balance assessment: Needs assistance Sitting-balance support: Feet supported Sitting balance-Leahy Scale: Fair Sitting balance - Comments: pain limited Postural control: Right lateral lean Standing balance support: Bilateral upper extremity supported;During functional activity Standing balance-Leahy Scale: Poor Standing balance comment: requires assistance due to pain with WB                           ADL either performed or assessed with clinical judgement   ADL Overall ADL's : Needs assistance/impaired                                       General ADL Comments: MOD I self-drinking/feeding seated in chair. MIN A squat pivot t/f bed>chair.      Vision         Perception     Praxis      Pertinent Vitals/Pain Pain Assessment: Faces Faces Pain Scale: Hurts whole lot Pain Location: L hip Pain Descriptors / Indicators: Grimacing;Guarding;Discomfort Pain Intervention(s): Limited activity within patient's tolerance;Monitored during session;Repositioned;Patient requesting pain meds-RN notified     Hand Dominance     Extremity/Trunk Assessment Upper Extremity Assessment Upper Extremity Assessment: Overall WFL for tasks assessed  Lower Extremity Assessment Lower Extremity Assessment: Generalized weakness       Communication Communication Communication: HOH;Deaf (Deaf L ear, has hearing aids)   Cognition Arousal/Alertness: Awake/alert Behavior During Therapy: WFL for tasks assessed/performed Overall Cognitive Status: Within Functional Limits for tasks assessed                                 General Comments: A&O x4,  pleasant    General Comments       Exercises Exercises: Other exercises Other Exercises Other Exercises: Pt educated re: OT role, DME recs, d/c recs, importance of mobility for functional strengthening, falls prevention, pain mgmt Other Exercises: Self-feeding/drinking, sup>sit, squat pivot t/f, sitting/standing balance/tolerance   Shoulder Instructions      Home Living Family/patient expects to be discharged to:: Private residence Living Arrangements: Alone   Type of Home: House Home Access: Stairs to enter Technical brewer of Steps: 4 Entrance Stairs-Rails: Can reach both;Right;Left Home Layout: Multi-level;1/2 bath on main level;Bed/bath upstairs Alternate Level Stairs-Number of Steps: flight Alternate Level Stairs-Rails: Can reach both Bathroom Shower/Tub: Walk-in shower;Tub/shower unit   Bathroom Toilet: Standard Bathroom Accessibility: Yes   Home Equipment: Walker - 2 wheels;Cane - single point;Grab bars - tub/shower;Grab bars - toilet   Additional Comments: pt reports that her bed is upstairs however does state that if necessary to sleep downstairs, she has a recliner chair that she may be able to use      Prior Functioning/Environment Level of Independence: Independent        Comments: Patient reports she recently began using walker. History of multiple falls. Prefers to soak in tub.         OT Problem List: Decreased strength;Decreased range of motion;Decreased activity tolerance;Impaired balance (sitting and/or standing);Decreased knowledge of use of DME or AE;Pain      OT Treatment/Interventions: Self-care/ADL training;Therapeutic exercise;Energy conservation;DME and/or AE instruction;Therapeutic activities;Patient/family education;Balance training    OT Goals(Current goals can be found in the care plan section) Acute Rehab OT Goals Patient Stated Goal: to improve pain OT Goal Formulation: With patient Time For Goal Achievement: 09/09/19 Potential  to Achieve Goals: Good  OT Frequency: Min 2X/week   Barriers to D/C: Inaccessible home environment;Decreased caregiver support          Co-evaluation              AM-PAC OT "6 Clicks" Daily Activity     Outcome Measure Help from another person eating meals?: None Help from another person taking care of personal grooming?: None Help from another person toileting, which includes using toliet, bedpan, or urinal?: A Lot Help from another person bathing (including washing, rinsing, drying)?: A Lot Help from another person to put on and taking off regular upper body clothing?: A Little Help from another person to put on and taking off regular lower body clothing?: A Lot 6 Click Score: 17   End of Session Nurse Communication: Mobility status;Patient requests pain meds  Activity Tolerance: Patient limited by pain;Patient tolerated treatment well Patient left: in chair;with call bell/phone within reach;with chair alarm set  OT Visit Diagnosis: Other abnormalities of gait and mobility (R26.89);Repeated falls (R29.6);Pain Pain - Right/Left: Left Pain - part of body: Hip                Time: 9811-9147 OT Time Calculation (min): 23 min Charges:  OT General Charges $OT Visit: 1 Visit OT Evaluation $OT Eval Moderate Complexity: 1 Mod  OT Treatments $Self Care/Home Management : 8-22 mins  Dessie Coma, M.S. OTR/L  08/26/19, 4:05 PM  ascom [phone]

## 2019-08-27 MED ORDER — METOPROLOL TARTRATE 25 MG PO TABS
25.0000 mg | ORAL_TABLET | Freq: Two times a day (BID) | ORAL | Status: DC
  Administered 2019-08-27 – 2019-08-28 (×3): 25 mg via ORAL
  Filled 2019-08-27 (×3): qty 1

## 2019-08-27 MED ORDER — AMIODARONE HCL 200 MG PO TABS
200.0000 mg | ORAL_TABLET | Freq: Every day | ORAL | Status: DC
  Administered 2019-08-27 – 2019-08-28 (×2): 200 mg via ORAL
  Filled 2019-08-27 (×2): qty 1

## 2019-08-27 MED ORDER — OXYCODONE-ACETAMINOPHEN 5-325 MG PO TABS
1.0000 | ORAL_TABLET | ORAL | Status: DC | PRN
  Administered 2019-08-27 (×2): 1 via ORAL
  Filled 2019-08-27 (×2): qty 1

## 2019-08-27 MED ORDER — SODIUM CHLORIDE 0.9% FLUSH
10.0000 mL | Freq: Two times a day (BID) | INTRAVENOUS | Status: DC
  Administered 2019-08-27 – 2019-08-28 (×2): 10 mL via INTRAVENOUS

## 2019-08-27 NOTE — Progress Notes (Signed)
Physical Therapy Treatment Patient Details Name: [name] [name] MRN: [id number] DOB: 04/03/1929 Today's Date: 08/27/2019    History of Present Illness 86 y/o female admitted after fall while doing laundry. Patient with prior fall one month ago with right LE pain, now left hip pain with imaging showing sacral ala fracture and L5 fracture.  Pt with previous closed fx of R inferior pubic ramus. Chief complain is inability to bear weight on RLE since fall. PMH includes HTN, HLD, anxiety, squamous cell carconoma (top of head), deaf in L ear, and osteopenia. Per Dr. Posey Pronto from orthopedics, pt ok for WBAT.    PT Comments    PT treatment completed. Patient is making progress with therapy goals, however continues to be limited by left buttock/hip pain with upright activity. Patient required assistance for standing with standing tolerance of less than 2 minutes secondary to pain. Unable to progress ambulation at this time due to poor standing tolerance/pain. Patient will be unsafe to return home alone at this time, and she wishes to be discharge to SNF for rehab for ongoing PT efforts. Recommend PT continue to follow to address functional limitations.     Follow Up Recommendations  SNF     Equipment Recommendations  None recommended by PT    Recommendations for Other Services       Precautions / Restrictions Precautions Precautions: Fall Restrictions Weight Bearing Restrictions: Yes LLE Weight Bearing: Weight bearing as tolerated    Mobility  Bed Mobility Overal bed mobility: Needs Assistance Bed Mobility: Supine to Sit     Supine to sit: Min guard Sit to supine: HOB elevated   General bed mobility comments: extra time required to compelte tasks due to pain with movement   Transfers Overall transfer level: Needs assistance Equipment used: Rolling walker (2 wheeled) Transfers: Sit to/from Stand Sit to Stand: Min assist         General transfer comment: performed sit to  stand transfer x 2 bouts. verbal cues for technique and hand placement   Ambulation/Gait             General Gait Details: unable to ambulate at this time due to poor standing tolerance secondary to pain    Stairs             Wheelchair Mobility    Modified Rankin (Stroke Patients Only)       Balance Overall balance assessment: Needs assistance Sitting-balance support: Feet supported Sitting balance-Leahy Scale: Fair     Standing balance support: Bilateral upper extremity supported Standing balance-Leahy Scale: Poor Standing balance comment: patient relying heavily on rolling walker for support in standing due to pain. contact guard assistance required due to pain                             Cognition Arousal/Alertness: Awake/alert Behavior During Therapy: WFL for tasks assessed/performed Overall Cognitive Status: Within Functional Limits for tasks assessed                                 General Comments: A&O x4, pleasant       Exercises General Exercises - Lower Extremity Ankle Circles/Pumps: AROM;Strengthening;Both;10 reps;Supine Short Arc Quad: AROM;Strengthening;Both;10 reps;Supine Long Arc Quad: AROM;Strengthening;Both;10 reps;Seated Heel Slides: AAROM;Strengthening;Both;10 reps;Supine Hip ABduction/ADduction: AAROM;Strengthening;Both;10 reps;Supine Hip Flexion/Marching: AROM;Strengthening;Both;5 reps;Standing (using rolling walker for support in standing ) Other Exercises Other Exercises: verbal cues for technique  General Comments        Pertinent Vitals/Pain Pain Assessment: Faces Faces Pain Scale: Hurts even more Pain Location:  (posterior left buttocks) Pain Descriptors / Indicators: Grimacing Pain Intervention(s): Limited activity within patient's tolerance    Home Living                      Prior Function            PT Goals (current goals can now be found in the care plan section) Acute  Rehab PT Goals Patient Stated Goal: to improve pain PT Goal Formulation: With patient Time For Goal Achievement: 09/10/19 Potential to Achieve Goals: Good Progress towards PT goals: Progressing toward goals    Frequency    7X/week      PT Plan Current plan remains appropriate    Co-evaluation              AM-PAC PT "6 Clicks" Mobility   Outcome Measure  Help needed turning from your back to your side while in a flat bed without using bedrails?: A Little Help needed moving from lying on your back to sitting on the side of a flat bed without using bedrails?: A Little Help needed moving to and from a bed to a chair (including a wheelchair)?: A Little Help needed standing up from a chair using your arms (e.g., wheelchair or bedside chair)?: A Little Help needed to walk in hospital room?: A Lot Help needed climbing 3-5 steps with a railing? : A Lot 6 Click Score: 16    End of Session Equipment Utilized During Treatment: Gait belt Activity Tolerance: Patient limited by pain Patient left: in bed;with call bell/phone within reach;with bed alarm set Nurse Communication: Mobility status PT Visit Diagnosis: Muscle weakness (generalized) (M62.81);Other abnormalities of gait and mobility (R26.89);Repeated falls (R29.6);Difficulty in walking, not elsewhere classified (R26.2);Pain;Unsteadiness on feet (R26.81) Pain - Right/Left: Left Pain - part of body: Hip (posterior buttock/hip area )     Time: 1110-1140 PT Time Calculation (min) (ACUTE ONLY): 30 min  Charges:  $Therapeutic Exercise: 8-22 mins $Therapeutic Activity: 8-22 mins                     Minna Merritts, PT, MPT    Percell Locus 08/27/2019, 1:02 PM

## 2019-08-27 NOTE — NC FL2 (Signed)
[place] LEVEL OF CARE SCREENING TOOL     IDENTIFICATION  Patient Name: [name] [name] Birthdate: [birth year] Sex: female Admission Date (Current Location): 08/25/2019  Industry and [place] Number:  Engineering geologist and Address:  [facility], [address], [place], [place] [address]      Provider Number: [id number]  Attending Physician Name and Address:  Sharen Hones, MD  Relative Name and Phone Number:       Current Level of Care: Hospital Recommended Level of Care: [place] Prior Approval Number:    Date Approved/Denied:   PASRR Number: [id number]  Discharge Plan: ICF    Current Diagnoses: Patient Active Problem List   Diagnosis Date Noted  . Pelvic fracture ([place]) 08/25/2019  . Atrial fibrillation ([place]) 08/25/2019  . Pain   . Dehydration   . Accidental fall 07/26/2019  . UTI (urinary tract infection) 07/26/2019  . Closed fracture of right inferior pubic ramus ([place]) 07/26/2019  . HTN (hypertension) 07/26/2019  . Ambulatory dysfunction 07/26/2019  . Pubic bone fracture ([facility]) 07/26/2019    Orientation RESPIRATION BLADDER Height & Weight     Self, Time, Situation, Place  Normal External catheter, Incontinent (07/25/19) Weight: 117 lb (53.1 kg) Height:  5\' 3"  (160 cm)  BEHAVIORAL SYMPTOMS/MOOD NEUROLOGICAL BOWEL NUTRITION STATUS      Continent Diet (heart healthy, thin liquids)  AMBULATORY STATUS COMMUNICATION OF NEEDS Skin   Limited Assist Verbally Normal                       Personal Care Assistance Level of Assistance  Bathing, Dressing, Feeding Bathing Assistance: Limited assistance Feeding assistance: Independent Dressing Assistance: Limited assistance     Functional Limitations Info  Sight, Hearing, Speech Sight Info: Adequate Hearing Info: Adequate Speech Info: Adequate    SPECIAL CARE FACTORS FREQUENCY  PT (By licensed PT), OT (By licensed OT)     PT Frequency: 5x OT  Frequency: 5x            Contractures Contractures Info: Not present    Additional Factors Info  Code Status, Allergies Code Status Info: Full Code Allergies Info: Bactrim (Sulfamethoxazole-trimethoprim), Ciprofloxacin, Pneumococcal Vaccines           Current Medications (08/27/2019):  This is the current hospital active medication list Current Facility-Administered Medications  Medication Dose Route Frequency Provider Last Rate Last Admin  . acetaminophen (TYLENOL) tablet 650 mg  650 mg Oral Q4H PRN Mansy, Jan A, MD      . ALPRAZolam Duanne Moron) tablet 0.25 mg  0.25 mg Oral BID PRN Mansy, Jan A, MD      . amiodarone (PACERONE) tablet 200 mg  200 mg Oral Daily Corey Skains, MD   200 mg at 08/27/19 0911  . apixaban (ELIQUIS) tablet 2.5 mg  2.5 mg Oral BID Mansy, Jan A, MD   2.5 mg at 08/27/19 0905  . ascorbic acid (VITAMIN C) tablet 1,000 mg  1,000 mg Oral Daily Mansy, Jan A, MD   1,000 mg at 08/27/19 0905  . aspirin EC tablet 81 mg  81 mg Oral Daily Mansy, Jan A, MD   81 mg at 08/27/19 0905  . calcium carbonate (TUMS - dosed in mg elemental calcium) chewable tablet 1,250 mg  1,250 mg Oral Daily Mansy, Jan A, MD   1,250 mg at 08/27/19 0905  . Chlorhexidine Gluconate Cloth 2 % PADS 6 each  6 each Topical Q0600 Mansy, Arvella Merles, MD  6 each at 08/27/19 0912  . famotidine (PEPCID) tablet 20 mg  20 mg Oral Daily Lang Snow, NP   20 mg at 08/27/19 0911  . fluticasone (FLONASE) 50 MCG/ACT nasal spray 2 spray  2 spray Each Nare Daily PRN Mansy, Jan A, MD      . folic acid (FOLVITE) tablet 1 mg  1,000 mcg Oral Daily Mansy, Jan A, MD   1 mg at 08/27/19 0905  . hydrochlorothiazide (HYDRODIURIL) tablet 25 mg  25 mg Oral Daily Mansy, Jan A, MD   25 mg at 08/27/19 0905  . HYDROcodone-acetaminophen (NORCO/VICODIN) 5-325 MG per tablet 1 tablet  1 tablet Oral Q4H PRN Mansy, Arvella Merles, MD   1 tablet at 08/27/19 0906  . labetalol (NORMODYNE) injection 20 mg  20 mg Intravenous Q3H PRN Mansy, Jan  A, MD      . lidocaine (LIDODERM) 5 % 1 patch  1 patch Transdermal Q24H Mansy, Jan A, MD   1 patch at 08/26/19 2109  . metoprolol tartrate (LOPRESSOR) tablet 25 mg  25 mg Oral BID Corey Skains, MD   25 mg at 08/27/19 0911  . morphine 2 MG/ML injection 2 mg  2 mg Intravenous Q4H PRN Mansy, Jan A, MD   2 mg at 08/26/19 0041  . multivitamin with minerals tablet 1 tablet  1 tablet Oral Daily Mansy, Jan A, MD   1 tablet at 08/27/19 0905  . mupirocin ointment (BACTROBAN) 2 % 1 application  1 application Nasal BID Mansy, Arvella Merles, MD   1 application at 35/57/32 0906  . polyethylene glycol (MIRALAX / GLYCOLAX) packet 17 g  17 g Oral Daily Mansy, Jan A, MD   17 g at 08/27/19 0906  . pravastatin (PRAVACHOL) tablet 40 mg  40 mg Oral q1800 Mansy, Jan A, MD   40 mg at 08/26/19 1654  . vitamin E capsule 400 Units  400 Units Oral Daily Mansy, Jan A, MD   400 Units at 08/27/19 0906     Discharge Medications: Please see discharge summary for a list of discharge medications.  Relevant Imaging Results:  Relevant Lab Results:   Additional Information [id number]  Gerrianne Scale Persephonie Hegwood, LCSW

## 2019-08-27 NOTE — Plan of Care (Signed)
  Problem: Education: Goal: Knowledge of disease or condition will improve Outcome: Progressing   Problem: Activity: Goal: Ability to tolerate increased activity will improve Outcome: Progressing   Problem: Cardiac: Goal: Ability to achieve and maintain adequate cardiopulmonary perfusion will improve Outcome: Progressing

## 2019-08-27 NOTE — Progress Notes (Signed)
[facility] Encounter Note  Patient: [name] [name] / Admit Date: 08/25/2019 / Date of Encounter: 08/27/2019, 9:02 AM   Subjective: Patient overall has felt much better since admission.  She has had left hip pain weakness and fatigue but no current evidence of congestive heart failure anginal symptoms and/or acute coronary syndrome.  Patient did come in with a fall and had some significant concerns of that fall and has some rehab necessary.  Atrial fibrillation with rapid ventricular rate was treated with intravenous amiodarone and intravenous diltiazem with spontaneous conversion to normal sinus rhythm and currently heart rate of 81 bpm and stable  Review of Systems: Positive for: Limb pain Negative for: Vision change, hearing change, syncope, dizziness, nausea, vomiting,diarrhea, bloody stool, stomach pain, cough, congestion, diaphoresis, urinary frequency, urinary pain,skin lesions, skin rashes Others previously listed  Objective: Telemetry: Normal sinus rhythm Physical Exam: Blood pressure (!) 131/50, pulse 77, temperature 98.2 F (36.8 C), temperature source Oral, resp. rate 20, height 5\' 3"  (1.6 m), weight 53.1 kg, SpO2 95 %. Body mass index is 20.73 kg/m. General: Well developed, well nourished, in no acute distress. Head: Normocephalic, atraumatic, sclera non-icteric, no xanthomas, nares are without discharge. Neck: No apparent masses Lungs: Normal respirations with no wheezes, no rhonchi, no rales , no crackles   Heart: Regular rate and rhythm, normal S1 S2, no murmur, no rub, no gallop, PMI is normal size and placement, carotid upstroke normal without bruit, jugular venous pressure normal Abdomen: Soft, non-tender, non-distended with normoactive bowel sounds. No hepatosplenomegaly. Abdominal aorta is normal size without bruit Extremities: No edema, no clubbing, no cyanosis, no ulcers,  Peripheral: 2+ radial, 2+ femoral, 2+ dorsal pedal pulses Neuro:  Alert and oriented. Moves all extremities spontaneously. Psych:  Responds to questions appropriately with a normal affect.   Intake/Output Summary (Last 24 hours) at 08/27/2019 0902 Last data filed at 08/27/2019 0538 Gross per 24 hour  Intake 143.31 ml  Output 1300 ml  Net -1156.69 ml    Inpatient Medications:  . amiodarone  200 mg Oral Daily  . apixaban  2.5 mg Oral BID  . ascorbic acid  1,000 mg Oral Daily  . aspirin EC  81 mg Oral Daily  . calcium carbonate  1,250 mg Oral Daily  . Chlorhexidine Gluconate Cloth  6 each Topical Q0600  . famotidine  20 mg Oral Daily  . folic acid  1,287 mcg Oral Daily  . hydrochlorothiazide  25 mg Oral Daily  . lidocaine  1 patch Transdermal Q24H  . multivitamin with minerals  1 tablet Oral Daily  . mupirocin ointment  1 application Nasal BID  . polyethylene glycol  17 g Oral Daily  . potassium chloride  40 mEq Oral Once  . pravastatin  40 mg Oral q1800  . vitamin E  400 Units Oral Daily   Infusions:   Labs: Recent Labs    08/25/19 1556 08/26/19 0458  NA 134* 139  K 3.6 3.9  CL 98 100  CO2 27 27  GLUCOSE 117* 109*  BUN 16 17  CREATININE 0.59 0.63  CALCIUM 9.4 9.1  MG 2.2  --    No results for input(s): AST, ALT, ALKPHOS, BILITOT, PROT, ALBUMIN in the last 72 hours. Recent Labs    08/25/19 1556 08/26/19 0458  WBC 8.5 8.2  HGB 13.3 12.7  HCT 39.7 38.9  MCV 91.1 93.1  PLT 323 234   No results for input(s): CKTOTAL, CKMB, TROPONINI in the last 72 hours. Invalid  input(s): POCBNP No results for input(s): HGBA1C in the last 72 hours.   Weights: Filed Weights   08/25/19 1442 08/26/19 1147 08/27/19 0525  Weight: 56.7 kg 58 kg 53.1 kg     Radiology/Studies:  CT PELVIS WO CONTRAST  Result Date: 08/25/2019 CLINICAL DATA:  86 year old female with fall and right hip pain. EXAM: CT PELVIS WITHOUT CONTRAST TECHNIQUE: Multidetector CT imaging of the pelvis was performed following the standard protocol without intravenous contrast.  COMPARISON:  Pelvic radiograph dated 08/05/2019 and CT abdomen pelvis dated 08/14/2015. FINDINGS: Evaluation of this exam is limited in the absence of intravenous contrast. Urinary Tract: The urinary bladder is grossly unremarkable. Punctate focus of air within the bladder may be related to recent instrumentation. Clinical correlation is recommended. Bowel: There is extensive sigmoid diverticulosis without active inflammatory changes. No bowel obstruction or active inflammation within the pelvis. The appendix is normal. Vascular/Lymphatic: Advanced atherosclerotic calcification of the aorta and iliac arteries. No adenopathy. Reproductive: Small vascular calcification versus calcified fibroid in the uterus. No adnexal masses. Other:  None Musculoskeletal: There is advanced osteopenia. Subacute or old appearing fractures of the right inferior and superior pubic rami with nonunion at this time. There is extension of the right superior pubic ramus fracture into the anterior column of the right acetabulum, also likely subacute or chronic. There is fracture of the right pubic bone adjacent to the symphysis pubis, age indeterminate. Nondisplaced fracture of the left sacral ala as well as fragmentation of the right sacral ala, likely insufficiency fractures, possibly acute. Acute appearing nondisplaced fractures of the L5 transverse process bilaterally. Status post prior internal fixation of the right femoral neck fracture with intramedullary nail. The visualized hardware appears intact. No definite acute fracture of the femoral neck. No dislocation. IMPRESSION: 1. Probable acute stress fractures of the sacral ala bilaterally as well as nondisplaced fractures of the L5 transverse processes. 2. Subacute or old appearing fractures of the right pubic bone and anterior column of the right acetabulum. 3. Status post prior internal fixation of the right femoral neck fracture with intramedullary nail. No definite acute fracture of  the femoral neck. 4. Sigmoid diverticulosis. 5. Aortic Atherosclerosis (ICD10-I70.0). Electronically Signed   By: Anner Crete M.D.   On: 08/25/2019 19:53   DG Hip Unilat W or Wo Pelvis 2-3 Views Left  Result Date: 08/25/2019 CLINICAL DATA:  Fall EXAM: DG HIP (WITH OR WITHOUT PELVIS) 2-3V LEFT COMPARISON:  07/25/2019 contralateral hip FINDINGS: There is anatomic alignment at the left hip. No acute fracture. Mild degenerative changes are present. Decreased osseous mineralization. Fractures of the right pubic bone and inferior pubic ramus are again identified. Partially imaged right femur intramedullary rod fixation. IMPRESSION: No acute fracture. Electronically Signed   By: Macy Mis M.D.   On: 08/25/2019 15:29     Assessment and Recommendation  86 y.o. female with multiple falling weakness fatigue and some mild apparent dementia with acute onset of atrial fibrillation with rapid ventricular rate now in normal sinus rhythm after intravenous medication management with no current evidence of congestive heart failure or myocardial infarction 1.  Discontinuation diltiazem and amiodarone drip and replaced with amiodarone 200 mg each day 2.  Metoprolol 25 mg twice per day for heart rate control and maintenance of normal sinus rhythm 3.  Eliquis 2.5 mg twice per day for risk reduction in stroke with atrial fibrillation and further follow closely for the possibility of recurrent falling and risk for bleeding complications 4.  No further cardiac diagnostics necessary  at this time with no evidence of acute coronary syndrome and/or congestive heart failure 5.  Rehabilitation from falls weakness fatigue and ambulate following for rhythm disturbances and need for further adjustments.  If ambulating well okay for discharge to home with follow-up in 1 to 2 weeks  Signed, Serafina Royals M.D. FACC

## 2019-08-27 NOTE — Progress Notes (Signed)
PROGRESS NOTE    [name] [name]  RXY:[id number] DOB: [birth year] DOA: 08/25/2019 PCP: [name], MD   Chief complaint.  Hip pain. Brief Narrative: JohnsieFairclothis 86 y.o.Caucasian femalewith a known history of hypertension and dyslipidemia, who presented to the emergency room with acute onset of left hip pain after having a mechanical fall when she was moving close from the washer to the dryer and apparently lost balance.  Upon arriving the emergency room, she was found to have atrial fibrillation with rapid ventricle response. Pelvic CT scan showed bilateral pelvic fracture.  7/28. Patient was seen by cardiology, amiodarone was switched to oral. Heart rate better controlled with diltiazem.  7/29.  Still has significant hip pain, able to stand, not able to walk.  Pain medicine changed to Percocet.   Assessment & Plan:   Active Problems:   Pelvic fracture (HCC)   Atrial fibrillation ([place])  1.  Acute onset atrial fibrillation with rapid ventricular response. Heart rate much better, diltiazem drip is discontinued by cardiology, added metoprolol..  Continue anticoagulation.  #2.  Pelvic fractures. Patient still has significant pain, difficulty with standing, not able to walk.  I will change pain medicine to Percocet from Lortab for better pain control.  Continue physical therapy/Occupational Therapy.  3.  Essential hypertension. Continue metoprolol.   DVT prophylaxis: Eliquis Code Status: Full Family Communication: None Disposition Plan:   Patient came from: Home                                                                                                                           Anticipated d/c place:  Barriers to d/c OR conditions which need to be met to effect a safe d/c:   Consultants:   Cardiology  Procedures: None Antimicrobials:None   Subjective: Patient did not have any pain last night when while asleep, but she experienced a  severe hip pain when she was standing up or she take a few steps.  No nausea vomiting.  No fever or chills.  No shortness of breath or cough.  Objective: Vitals:   08/27/19 0001 08/27/19 0525 08/27/19 0744 08/27/19 1120  BP: (!) 103/87 (!) 152/88 (!) 131/50 (!) 150/62  Pulse: 79 82 77 75  Resp: 20 20    Temp: 98.2 F (36.8 C) 98.3 F (36.8 C) 98.2 F (36.8 C) 98.5 F (36.9 C)  TempSrc: Oral Oral Oral Oral  SpO2: 99% 97% 95% 96%  Weight:  53.1 kg    Height:        Intake/Output Summary (Last 24 hours) at 08/27/2019 1432 Last data filed at 08/27/2019 1350 Gross per 24 hour  Intake 743.31 ml  Output 1300 ml  Net -556.69 ml   Filed Weights   08/25/19 1442 08/26/19 1147 08/27/19 0525  Weight: 56.7 kg 58 kg 53.1 kg    Examination:  General exam: Appears calm and comfortable  Respiratory system: Clear to auscultation. Respiratory effort normal. Cardiovascular system: Irregular and in no murmurs.  No JVD, murmurs, rubs, gallops or clicks. No pedal edema. Gastrointestinal system: Abdomen is nondistended, soft and nontender. No organomegaly or masses felt. Normal bowel sounds heard. Central nervous system: Alert and oriented. No focal neurological deficits. Extremities: Symmetric. Skin: No rashes, lesions or ulcers Psychiatry: Judgement and insight appear normal. Mood & affect appropriate.     Data Reviewed: I have personally reviewed following labs and imaging studies  CBC: Recent Labs  Lab 08/25/19 1556 08/26/19 0458  WBC 8.5 8.2  HGB 13.3 12.7  HCT 39.7 38.9  MCV 91.1 93.1  PLT 323 767   Basic Metabolic Panel: Recent Labs  Lab 08/25/19 1556 08/26/19 0458  NA 134* 139  K 3.6 3.9  CL 98 100  CO2 27 27  GLUCOSE 117* 109*  BUN 16 17  CREATININE 0.59 0.63  CALCIUM 9.4 9.1  MG 2.2  --    GFR: Estimated Creatinine Clearance: 40.2 mL/min (by C-G formula based on SCr of 0.63 mg/dL). Liver Function Tests: No results for input(s): AST, ALT, ALKPHOS, BILITOT,  PROT, ALBUMIN in the last 168 hours. No results for input(s): LIPASE, AMYLASE in the last 168 hours. No results for input(s): AMMONIA in the last 168 hours. Coagulation Profile: No results for input(s): INR, PROTIME in the last 168 hours. Cardiac Enzymes: No results for input(s): CKTOTAL, CKMB, CKMBINDEX, TROPONINI in the last 168 hours. BNP (last 3 results) No results for input(s): PROBNP in the last 8760 hours. HbA1C: No results for input(s): HGBA1C in the last 72 hours. CBG: No results for input(s): GLUCAP in the last 168 hours. Lipid Profile: No results for input(s): CHOL, HDL, LDLCALC, TRIG, CHOLHDL, LDLDIRECT in the last 72 hours. Thyroid Function Tests: Recent Labs    08/25/19 1556  TSH 0.593   Anemia Panel: No results for input(s): VITAMINB12, FOLATE, FERRITIN, TIBC, IRON, RETICCTPCT in the last 72 hours. Sepsis Labs: No results for input(s): PROCALCITON, LATICACIDVEN in the last 168 hours.  Recent Results (from the past 240 hour(s))  SARS Coronavirus 2 by RT PCR (hospital order, performed in [facility] hospital lab) Nasopharyngeal Nasopharyngeal Swab     Status: None   Collection Time: 08/25/19  9:52 PM   Specimen: Nasopharyngeal Swab  Result Value Ref Range Status   SARS Coronavirus 2 NEGATIVE NEGATIVE Final    Comment: (NOTE) SARS-CoV-2 target nucleic acids are NOT DETECTED.  The SARS-CoV-2 RNA is generally detectable in upper and lower respiratory specimens during the acute phase of infection. The lowest concentration of SARS-CoV-2 viral copies this assay can detect is 250 copies / mL. A negative result does not preclude SARS-CoV-2 infection and should not be used as the sole basis for treatment or other patient management decisions.  A negative result may occur with improper specimen collection / handling, submission of specimen other than nasopharyngeal swab, presence of viral mutation(s) within the areas targeted by this assay, and inadequate number of viral  copies (<250 copies / mL). A negative result must be combined with clinical observations, patient history, and epidemiological information.  Fact Sheet for Patients:   StrictlyIdeas.no  Fact Sheet for Healthcare Providers: BankingDealers.co.za  This test is not yet approved or  cleared by the [place] FDA and has been authorized for detection and/or diagnosis of SARS-CoV-2 by FDA under an Emergency Use Authorization (EUA).  This EUA will remain in effect (meaning this test can be used) for the duration of the COVID-19 declaration under Section 564(b)(1) of the Act, 21 U.S.C. section 360bbb-3(b)(1), unless the authorization  is terminated or revoked sooner.  Performed at [facility], [address] [place], [place] [address]   MRSA PCR Screening     Status: Abnormal   Collection Time: 08/26/19  9:12 PM   Specimen: Nasal Mucosa; Nasopharyngeal  Result Value Ref Range Status   MRSA by PCR POSITIVE (A) NEGATIVE Final    Comment:        The GeneXpert MRSA Assay (FDA approved for NASAL specimens only), is one component of a comprehensive MRSA colonization surveillance program. It is not intended to diagnose MRSA infection nor to guide or monitor treatment for MRSA infections. RESULT CALLED TO, READ BACK BY AND VERIFIED WITH: MARCHEL TURNER 08/26/19 AT 2231 BY AR Performed at [facility], [address], [place], [place] [address]          Radiology Studies: CT PELVIS WO CONTRAST  Result Date: 08/25/2019 CLINICAL DATA:  86 year old female with fall and right hip pain. EXAM: CT PELVIS WITHOUT CONTRAST TECHNIQUE: Multidetector CT imaging of the pelvis was performed following the standard protocol without intravenous contrast. COMPARISON:  Pelvic radiograph dated 08/05/2019 and CT abdomen pelvis dated 08/14/2015. FINDINGS: Evaluation of this exam is limited in the absence of intravenous contrast. Urinary  Tract: The urinary bladder is grossly unremarkable. Punctate focus of air within the bladder may be related to recent instrumentation. Clinical correlation is recommended. Bowel: There is extensive sigmoid diverticulosis without active inflammatory changes. No bowel obstruction or active inflammation within the pelvis. The appendix is normal. Vascular/Lymphatic: Advanced atherosclerotic calcification of the aorta and iliac arteries. No adenopathy. Reproductive: Small vascular calcification versus calcified fibroid in the uterus. No adnexal masses. Other:  None Musculoskeletal: There is advanced osteopenia. Subacute or old appearing fractures of the right inferior and superior pubic rami with nonunion at this time. There is extension of the right superior pubic ramus fracture into the anterior column of the right acetabulum, also likely subacute or chronic. There is fracture of the right pubic bone adjacent to the symphysis pubis, age indeterminate. Nondisplaced fracture of the left sacral ala as well as fragmentation of the right sacral ala, likely insufficiency fractures, possibly acute. Acute appearing nondisplaced fractures of the L5 transverse process bilaterally. Status post prior internal fixation of the right femoral neck fracture with intramedullary nail. The visualized hardware appears intact. No definite acute fracture of the femoral neck. No dislocation. IMPRESSION: 1. Probable acute stress fractures of the sacral ala bilaterally as well as nondisplaced fractures of the L5 transverse processes. 2. Subacute or old appearing fractures of the right pubic bone and anterior column of the right acetabulum. 3. Status post prior internal fixation of the right femoral neck fracture with intramedullary nail. No definite acute fracture of the femoral neck. 4. Sigmoid diverticulosis. 5. Aortic Atherosclerosis (ICD10-I70.0). Electronically Signed   By: Anner Crete M.D.   On: 08/25/2019 19:53   DG Hip Unilat W  or Wo Pelvis 2-3 Views Left  Result Date: 08/25/2019 CLINICAL DATA:  Fall EXAM: DG HIP (WITH OR WITHOUT PELVIS) 2-3V LEFT COMPARISON:  07/25/2019 contralateral hip FINDINGS: There is anatomic alignment at the left hip. No acute fracture. Mild degenerative changes are present. Decreased osseous mineralization. Fractures of the right pubic bone and inferior pubic ramus are again identified. Partially imaged right femur intramedullary rod fixation. IMPRESSION: No acute fracture. Electronically Signed   By: Macy Mis M.D.   On: 08/25/2019 15:29        Scheduled Meds: . amiodarone  200 mg Oral Daily  .  apixaban  2.5 mg Oral BID  . ascorbic acid  1,000 mg Oral Daily  . aspirin EC  81 mg Oral Daily  . calcium carbonate  1,250 mg Oral Daily  . Chlorhexidine Gluconate Cloth  6 each Topical Q0600  . famotidine  20 mg Oral Daily  . folic acid  1,833 mcg Oral Daily  . lidocaine  1 patch Transdermal Q24H  . metoprolol tartrate  25 mg Oral BID  . multivitamin with minerals  1 tablet Oral Daily  . mupirocin ointment  1 application Nasal BID  . polyethylene glycol  17 g Oral Daily  . pravastatin  40 mg Oral q1800  . vitamin E  400 Units Oral Daily   Continuous Infusions:   LOS: 2 days    Time spent: 26 minutes    Sharen Hones, MD [facility]   To contact the attending provider between 7A-7P or the covering provider during after hours 7P-7A, please log into the web site [email or web address] and access using universal [place] password for that web site. If you do not have the password, please call the hospital operator.  08/27/2019, 2:32 PM

## 2019-08-28 MED ORDER — OXYCODONE-ACETAMINOPHEN 5-325 MG PO TABS: 1.0000 | ORAL_TABLET | Freq: Four times a day (QID) | ORAL | 0 refills | Status: DC | PRN

## 2019-08-28 MED ORDER — AMIODARONE HCL 200 MG PO TABS: 200.0000 mg | ORAL_TABLET | Freq: Every day | ORAL | 0 refills | Status: DC

## 2019-08-28 MED ORDER — METOPROLOL TARTRATE 25 MG PO TABS: 25.0000 mg | ORAL_TABLET | Freq: Two times a day (BID) | ORAL | 0 refills | Status: DC

## 2019-08-28 MED ORDER — ASPIRIN 81 MG PO TBEC: 81.0000 mg | DELAYED_RELEASE_TABLET | Freq: Every day | ORAL | 0 refills | Status: DC

## 2019-08-28 MED ORDER — APIXABAN 2.5 MG PO TABS: 2.5000 mg | ORAL_TABLET | Freq: Two times a day (BID) | ORAL | 0 refills | Status: DC

## 2019-08-28 NOTE — Progress Notes (Signed)
[facility] Encounter Note  Patient: [name] [name] / Admit Date: 08/25/2019 / Date of Encounter: 08/28/2019, 8:53 AM   Subjective: Patient overall has felt much better since admission.  She has had left hip pain weakness and fatigue but no current evidence of congestive heart failure anginal symptoms and/or acute coronary syndrome.  Patient did come in with a fall and had some significant concerns of that fall and has some rehab necessary.  Atrial fibrillation with rapid ventricular rate was treated with intravenous amiodarone and intravenous diltiazem with spontaneous conversion to normal sinus rhythm and currently heart rate of 81 bpm and stable Overall ambulated yesterday with no further significant symptoms or atrial fibrillation Review of Systems: Positive for: Limb pain Negative for: Vision change, hearing change, syncope, dizziness, nausea, vomiting,diarrhea, bloody stool, stomach pain, cough, congestion, diaphoresis, urinary frequency, urinary pain,skin lesions, skin rashes Others previously listed  Objective: Telemetry: Normal sinus rhythm Physical Exam: Blood pressure (!) 142/88, pulse 74, temperature 98.3 F (36.8 C), temperature source Oral, resp. rate 16, height 5\' 3"  (1.6 m), weight 54.9 kg, SpO2 96 %. Body mass index is 21.43 kg/m. General: Well developed, well nourished, in no acute distress. Head: Normocephalic, atraumatic, sclera non-icteric, no xanthomas, nares are without discharge. Neck: No apparent masses Lungs: Normal respirations with no wheezes, no rhonchi, no rales , no crackles   Heart: Regular rate and rhythm, normal S1 S2, no murmur, no rub, no gallop, PMI is normal size and placement, carotid upstroke normal without bruit, jugular venous pressure normal Abdomen: Soft, non-tender, non-distended with normoactive bowel sounds. No hepatosplenomegaly. Abdominal aorta is normal size without bruit Extremities: No edema, no clubbing, no  cyanosis, no ulcers,  Peripheral: 2+ radial, 2+ femoral, 2+ dorsal pedal pulses Neuro: Alert and oriented. Moves all extremities spontaneously. Psych:  Responds to questions appropriately with a normal affect.   Intake/Output Summary (Last 24 hours) at 08/28/2019 0853 Last data filed at 08/28/2019 0549 Gross per 24 hour  Intake 840 ml  Output 1200 ml  Net -360 ml    Inpatient Medications:   amiodarone  200 mg Oral Daily   apixaban  2.5 mg Oral BID   ascorbic acid  1,000 mg Oral Daily   aspirin EC  81 mg Oral Daily   calcium carbonate  1,250 mg Oral Daily   Chlorhexidine Gluconate Cloth  6 each Topical Q0600   famotidine  20 mg Oral Daily   folic acid  7,124 mcg Oral Daily   lidocaine  1 patch Transdermal Q24H   metoprolol tartrate  25 mg Oral BID   multivitamin with minerals  1 tablet Oral Daily   mupirocin ointment  1 application Nasal BID   polyethylene glycol  17 g Oral Daily   pravastatin  40 mg Oral q1800   sodium chloride flush  10 mL Intravenous Q12H   vitamin E  400 Units Oral Daily   Infusions:   Labs: Recent Labs    08/25/19 1556 08/26/19 0458  NA 134* 139  K 3.6 3.9  CL 98 100  CO2 27 27  GLUCOSE 117* 109*  BUN 16 17  CREATININE 0.59 0.63  CALCIUM 9.4 9.1  MG 2.2  --    No results for input(s): AST, ALT, ALKPHOS, BILITOT, PROT, ALBUMIN in the last 72 hours. Recent Labs    08/25/19 1556 08/26/19 0458  WBC 8.5 8.2  HGB 13.3 12.7  HCT 39.7 38.9  MCV 91.1 93.1  PLT 323 234   No  results for input(s): CKTOTAL, CKMB, TROPONINI in the last 72 hours. Invalid input(s): POCBNP No results for input(s): HGBA1C in the last 72 hours.   Weights: Filed Weights   08/26/19 1147 08/27/19 0525 08/28/19 0540  Weight: 58 kg 53.1 kg 54.9 kg     Radiology/Studies:  CT PELVIS WO CONTRAST  Result Date: 08/25/2019 CLINICAL DATA:  86 year old female with fall and right hip pain. EXAM: CT PELVIS WITHOUT CONTRAST TECHNIQUE: Multidetector CT imaging  of the pelvis was performed following the standard protocol without intravenous contrast. COMPARISON:  Pelvic radiograph dated 08/05/2019 and CT abdomen pelvis dated 08/14/2015. FINDINGS: Evaluation of this exam is limited in the absence of intravenous contrast. Urinary Tract: The urinary bladder is grossly unremarkable. Punctate focus of air within the bladder may be related to recent instrumentation. Clinical correlation is recommended. Bowel: There is extensive sigmoid diverticulosis without active inflammatory changes. No bowel obstruction or active inflammation within the pelvis. The appendix is normal. Vascular/Lymphatic: Advanced atherosclerotic calcification of the aorta and iliac arteries. No adenopathy. Reproductive: Small vascular calcification versus calcified fibroid in the uterus. No adnexal masses. Other:  None Musculoskeletal: There is advanced osteopenia. Subacute or old appearing fractures of the right inferior and superior pubic rami with nonunion at this time. There is extension of the right superior pubic ramus fracture into the anterior column of the right acetabulum, also likely subacute or chronic. There is fracture of the right pubic bone adjacent to the symphysis pubis, age indeterminate. Nondisplaced fracture of the left sacral ala as well as fragmentation of the right sacral ala, likely insufficiency fractures, possibly acute. Acute appearing nondisplaced fractures of the L5 transverse process bilaterally. Status post prior internal fixation of the right femoral neck fracture with intramedullary nail. The visualized hardware appears intact. No definite acute fracture of the femoral neck. No dislocation. IMPRESSION: 1. Probable acute stress fractures of the sacral ala bilaterally as well as nondisplaced fractures of the L5 transverse processes. 2. Subacute or old appearing fractures of the right pubic bone and anterior column of the right acetabulum. 3. Status post prior internal fixation  of the right femoral neck fracture with intramedullary nail. No definite acute fracture of the femoral neck. 4. Sigmoid diverticulosis. 5. Aortic Atherosclerosis (ICD10-I70.0). Electronically Signed   By: Anner Crete M.D.   On: 08/25/2019 19:53   DG Hip Unilat W or Wo Pelvis 2-3 Views Left  Result Date: 08/25/2019 CLINICAL DATA:  Fall EXAM: DG HIP (WITH OR WITHOUT PELVIS) 2-3V LEFT COMPARISON:  07/25/2019 contralateral hip FINDINGS: There is anatomic alignment at the left hip. No acute fracture. Mild degenerative changes are present. Decreased osseous mineralization. Fractures of the right pubic bone and inferior pubic ramus are again identified. Partially imaged right femur intramedullary rod fixation. IMPRESSION: No acute fracture. Electronically Signed   By: Macy Mis M.D.   On: 08/25/2019 15:29     Assessment and Recommendation  86 y.o. female with multiple falling weakness fatigue and some mild apparent dementia with acute onset of atrial fibrillation with rapid ventricular rate now in normal sinus rhythm after intravenous medication management with no current evidence of congestive heart failure or myocardial infarction 1.  Continuation of amiodarone at this time for maintenance of normal sinus rhythm 2.  Continuation of metoprolol 25 mg twice per day for heart rate control and maintenance of normal sinus rhythm 3.  Eliquis 2.5 mg twice per day for risk reduction in stroke with atrial fibrillation and further follow closely for the possibility of recurrent  falling and risk for bleeding complications 4.  No further cardiac diagnostics necessary at this time with no evidence of acute coronary syndrome and/or congestive heart failure 5.  Rehabilitation from falls weakness fatigue and ambulate following for rhythm disturbances and need for further adjustments.  If ambulating well okay for discharge to home with follow-up in 1 to 2 weeks  Signed, Serafina Royals M.D. FACC

## 2019-08-28 NOTE — Care Management Important Message (Signed)
Important Message  Patient Details  Name: [name] [name] MRN: [id number] Date of Birth: 1929-07-14   Medicare Important Message Given:  Yes     Dannette Barbara 08/28/2019, 4:17 PM

## 2019-08-28 NOTE — Discharge Summary (Signed)
Physician Discharge Summary  Patient ID: [name] [name] MRN: [id number] DOB/AGE: December 30, 1929 86 y.o.  Admit date: 08/25/2019 Discharge date: 08/28/2019  Admission Diagnoses:  Discharge Diagnoses:  Active Problems:   Pelvic fracture (HCC)   Atrial fibrillation [facility])   Discharged Condition: good  Hospital Course:  [name] [name], who presented to the emergency room with acute onset of left hip pain after having a mechanical fall when she was moving close from the washer to the dryer and apparently lost balance. Upon arriving the emergency room, she was found to have atrial fibrillation with rapid ventricle response.Pelvic CT scan showed bilateral pelvic fracture.  7/28. Patient was seen by cardiology, amiodarone was switched to oral. Heart rate better controlled with diltiazem.  7/29.  Still has significant hip pain, able to stand, not able to walk.  Pain medicine changed to Percocet.  1.  Acute onset atrial fibrillation with rapid ventricular response. Continue metoprolol and amiodarone..  Continue anticoagulation.  Follow-up with cardiology as outpatient.  #2.  Pelvic fractures. Pain is better controlled with the Percocet.  Will need to continue physical therapy/Occupational Therapy.   3.  Essential hypertension. Continue metoprolol.  Consults: cardiology  Significant Diagnostic Studies:  CT PELVIS WITHOUT CONTRAST  TECHNIQUE: Multidetector CT imaging of the pelvis was performed following the standard protocol without intravenous contrast.  COMPARISON:  Pelvic radiograph dated 08/05/2019 and CT abdomen pelvis dated 08/14/2015.  FINDINGS: Evaluation of this exam is limited in the absence of intravenous contrast.  Urinary Tract: The urinary bladder is grossly unremarkable. Punctate focus of air within the bladder may be related to recent instrumentation. Clinical  correlation is recommended.  Bowel: There is extensive sigmoid diverticulosis without active inflammatory changes. No bowel obstruction or active inflammation within the pelvis. The appendix is normal.  Vascular/Lymphatic: Advanced atherosclerotic calcification of the aorta and iliac arteries. No adenopathy.  Reproductive: Small vascular calcification versus calcified fibroid in the uterus. No adnexal masses.  Other:  None  Musculoskeletal: There is advanced osteopenia. Subacute or old appearing fractures of the right inferior and superior pubic rami with nonunion at this time. There is extension of the right superior pubic ramus fracture into the anterior column of the right acetabulum, also likely subacute or chronic. There is fracture of the right pubic bone adjacent to the symphysis pubis, age indeterminate.  Nondisplaced fracture of the left sacral ala as well as fragmentation of the right sacral ala, likely insufficiency fractures, possibly acute. Acute appearing nondisplaced fractures of the L5 transverse process bilaterally.  Status post prior internal fixation of the right femoral neck fracture with intramedullary nail. The visualized hardware appears intact. No definite acute fracture of the femoral neck. No dislocation.  IMPRESSION: 1. Probable acute stress fractures of the sacral ala bilaterally as well as nondisplaced fractures of the L5 transverse processes. 2. Subacute or old appearing fractures of the right pubic bone and anterior column of the right acetabulum. 3. Status post prior internal fixation of the right femoral neck fracture with intramedullary nail. No definite acute fracture of the femoral neck. 4. Sigmoid diverticulosis. 5. Aortic Atherosclerosis (ICD10-I70.0).   Electronically Signed   By: Anner Crete M.D.   On: 08/25/2019 19:53   Treatments: Pain control, initiate diltiazem drip, anticoagulation  Discharge Exam: Blood  pressure (!) 142/88, pulse 74, temperature 98.3 F (36.8 C), temperature source Oral, resp. rate 16, height 5\' 3"  (1.6 m), weight 54.9 kg, SpO2 96 %. General appearance: alert and  cooperative Resp: clear to auscultation bilaterally Cardio: Irregular no murmurs. GI: soft, non-tender; bowel sounds normal; no masses,  no organomegaly Extremities: extremities normal, atraumatic, no cyanosis or edema  Disposition: Discharge disposition: 03-Skilled Nursing Facility       Discharge Instructions    Amb referral to [facility]   Complete by: As directed    Diet - low sodium heart healthy   Complete by: As directed    Increase activity slowly   Complete by: As directed      Allergies as of 08/28/2019      Reactions   Bactrim [sulfamethoxazole-trimethoprim] Other (See Comments)   Hearing loss   Ciprofloxacin Other (See Comments)   Flu-like symptoms   Pneumococcal Vaccines Other (See Comments)   Flu-like symptoms      Medication List    STOP taking these medications   b complex vitamins capsule   beta carotene 25000 UNIT capsule   Biotin 5 MG Caps   GARLIC PO   HAIR/SKIN/NAILS PO   hydrochlorothiazide 25 MG tablet Commonly known as: HYDRODIURIL   HYDROcodone-acetaminophen 5-325 MG tablet Commonly known as: NORCO/VICODIN   ibuprofen 400 MG tablet Commonly known as: ADVIL   Selenium 100 MCG Tabs   verapamil 180 MG CR tablet Commonly known as: CALAN-SR   vitamin E 180 MG (400 UNITS) capsule     TAKE these medications   amiodarone 200 MG tablet Commonly known as: PACERONE Take 1 tablet (200 mg total) by mouth daily. Start taking on: August 29, 2019   apixaban 2.5 MG Tabs tablet Commonly known as: ELIQUIS Take 1 tablet (2.5 mg total) by mouth 2 (two) times daily.   ascorbic acid 1000 MG tablet Commonly known as: VITAMIN C Take 1,000 mg by mouth daily.   aspirin 81 MG EC tablet Take 1 tablet (81 mg total) by mouth daily. Swallow whole. Start taking on: August 29, 2019   calcium carbonate 1250 MG capsule Take 1,250 mg by mouth daily.   fluticasone 50 MCG/ACT nasal spray Commonly known as: FLONASE Place 2 sprays into both nostrils daily as needed for rhinitis.   folic acid 250 MCG tablet Commonly known as: FOLVITE Take 800 mcg by mouth daily.   GLUCOSAMINE PO Take 1 Dose by mouth daily.   lidocaine 5 % Commonly known as: Lidoderm Place 1 patch onto the skin every 12 (twelve) hours. Remove & Discard patch within 12 hours or as directed by MD   lovastatin 40 MG tablet Commonly known as: MEVACOR Take 40 mg by mouth at bedtime.   metoprolol tartrate 25 MG tablet Commonly known as: LOPRESSOR Take 1 tablet (25 mg total) by mouth 2 (two) times daily.   multivitamin tablet Take 1 tablet by mouth daily.   oxyCODONE-acetaminophen 5-325 MG tablet Commonly known as: PERCOCET/ROXICET Take 1 tablet by mouth every 6 (six) hours as needed for moderate pain or severe pain.   polyethylene glycol 17 g packet Commonly known as: MIRALAX / GLYCOLAX Take 17 g by mouth daily.   tretinoin 0.05 % cream Commonly known as: RETIN-A Apply 1 Dose topically at bedtime.       Follow-up Information    [name], MD Follow up in 1 week(s).   Specialty: Family Medicine Contact information: [place] [address] [phone]        Corey Skains, MD Follow up in 3 week(s).   Specialty: Cardiology Contact information: [address] [facility] [place] [address] [phone]  Signed: Sharen Hones 08/28/2019, 11:54 AM

## 2019-08-28 NOTE — TOC Transition Note (Signed)
Transition of Care [facility]) - CM/SW Discharge Note   Patient Details  Name: [name] [name] MRN: [id number] Date of Birth: 08-24-1929  [facility] CM/SW Contact:  Eileen Stanford, LCSW Phone Number: 08/28/2019, 1:00 PM   Clinical Narrative:  Clinical Social Worker facilitated patient discharge including contacting patient family and facility to confirm patient discharge plans.  Clinical information faxed to facility and family agreeable with plan.  CSW arranged ambulance transport via [facility] to [facility] .  RN to call (at 1:30) [phone] for report prior to discharge.  Clinical Social Worker will sign off for now as social work intervention is no longer needed. Please consult [name] again if new need arises.  [place], [place]      Final next level of care: Skilled Nursing Facility Barriers to Discharge: No Barriers Identified   Patient Goals and CMS Choice        Discharge Placement              Patient chooses bed at: [facility] Patient to be transferred to facility by: [facility] Name of family member notified: Spouse Patient and family notified of of transfer: 08/28/19  Discharge Plan and Services                                     Social Determinants of Health (SDOH) Interventions     Readmission Risk Interventions No flowsheet data found.

## 2019-08-31 DIAGNOSIS — Z8781 Personal history of (healed) traumatic fracture: Secondary | ICD-10-CM | POA: Insufficient documentation

## 2019-08-31 DIAGNOSIS — H9190 Unspecified hearing loss, unspecified ear: Secondary | ICD-10-CM | POA: Insufficient documentation

## 2019-09-25 DIAGNOSIS — I700 Atherosclerosis of aorta: Secondary | ICD-10-CM | POA: Insufficient documentation

## 2019-12-31 ENCOUNTER — Inpatient Hospital Stay
Admission: EM | Admit: 2019-12-31 | Discharge: 2020-01-03 | DRG: 603 | Disposition: A | Discharge status: Home / Self Care | Payer: Medicare Other | Attending: Internal Medicine | Admitting: Internal Medicine

## 2019-12-31 ENCOUNTER — Emergency Department: Payer: Medicare Other

## 2019-12-31 ENCOUNTER — Other Ambulatory Visit: Payer: Self-pay

## 2019-12-31 DIAGNOSIS — L03116 Cellulitis of left lower limb: Secondary | ICD-10-CM | POA: Diagnosis not present

## 2019-12-31 DIAGNOSIS — Z79899 Other long term (current) drug therapy: Secondary | ICD-10-CM

## 2019-12-31 DIAGNOSIS — L0390 Cellulitis, unspecified: Secondary | ICD-10-CM | POA: Diagnosis not present

## 2019-12-31 DIAGNOSIS — D500 Iron deficiency anemia secondary to blood loss (chronic): Secondary | ICD-10-CM | POA: Diagnosis not present

## 2019-12-31 DIAGNOSIS — Z9842 Cataract extraction status, left eye: Secondary | ICD-10-CM

## 2019-12-31 DIAGNOSIS — Z888 Allergy status to other drugs, medicaments and biological substances status: Secondary | ICD-10-CM

## 2019-12-31 DIAGNOSIS — I4821 Permanent atrial fibrillation: Secondary | ICD-10-CM

## 2019-12-31 DIAGNOSIS — I10 Essential (primary) hypertension: Secondary | ICD-10-CM | POA: Diagnosis not present

## 2019-12-31 DIAGNOSIS — H9192 Unspecified hearing loss, left ear: Secondary | ICD-10-CM | POA: Diagnosis present

## 2019-12-31 DIAGNOSIS — Z7901 Long term (current) use of anticoagulants: Secondary | ICD-10-CM

## 2019-12-31 DIAGNOSIS — D649 Anemia, unspecified: Secondary | ICD-10-CM

## 2019-12-31 DIAGNOSIS — Z20822 Contact with and (suspected) exposure to covid-19: Secondary | ICD-10-CM | POA: Diagnosis present

## 2019-12-31 DIAGNOSIS — E876 Hypokalemia: Secondary | ICD-10-CM

## 2019-12-31 DIAGNOSIS — Z961 Presence of intraocular lens: Secondary | ICD-10-CM | POA: Diagnosis present

## 2019-12-31 DIAGNOSIS — Z9841 Cataract extraction status, right eye: Secondary | ICD-10-CM

## 2019-12-31 DIAGNOSIS — Z87891 Personal history of nicotine dependence: Secondary | ICD-10-CM

## 2019-12-31 DIAGNOSIS — L89899 Pressure ulcer of other site, unspecified stage: Secondary | ICD-10-CM | POA: Diagnosis present

## 2019-12-31 DIAGNOSIS — E785 Hyperlipidemia, unspecified: Secondary | ICD-10-CM | POA: Diagnosis present

## 2019-12-31 DIAGNOSIS — Z85828 Personal history of other malignant neoplasm of skin: Secondary | ICD-10-CM

## 2019-12-31 DIAGNOSIS — E7800 Pure hypercholesterolemia, unspecified: Secondary | ICD-10-CM | POA: Diagnosis present

## 2019-12-31 LAB — CBC WITH DIFFERENTIAL/PLATELET
Abs Immature Granulocytes: 0.03 10*3/uL (ref 0.00–0.07)
Basophils Absolute: 0.1 10*3/uL (ref 0.0–0.1)
Basophils Relative: 1 %
Eosinophils Absolute: 0.3 10*3/uL (ref 0.0–0.5)
Eosinophils Relative: 4 %
HCT: 25 % — ABNORMAL LOW (ref 36.0–46.0)
Hemoglobin: 7.2 g/dL — ABNORMAL LOW (ref 12.0–15.0)
Immature Granulocytes: 0 %
Lymphocytes Relative: 21 %
Lymphs Abs: 1.8 10*3/uL (ref 0.7–4.0)
MCH: 22.7 pg — ABNORMAL LOW (ref 26.0–34.0)
MCHC: 28.8 g/dL — ABNORMAL LOW (ref 30.0–36.0)
MCV: 78.9 fL — ABNORMAL LOW (ref 80.0–100.0)
Monocytes Absolute: 1.2 10*3/uL — ABNORMAL HIGH (ref 0.1–1.0)
Monocytes Relative: 14 %
Neutro Abs: 5.1 10*3/uL (ref 1.7–7.7)
Neutrophils Relative %: 60 %
Platelets: 365 10*3/uL (ref 150–400)
RBC: 3.17 MIL/uL — ABNORMAL LOW (ref 3.87–5.11)
RDW: 17.1 % — ABNORMAL HIGH (ref 11.5–15.5)
WBC: 8.5 10*3/uL (ref 4.0–10.5)
nRBC: 0 % (ref 0.0–0.2)

## 2019-12-31 LAB — COMPREHENSIVE METABOLIC PANEL
ALT: 14 U/L (ref 0–44)
AST: 21 U/L (ref 15–41)
Albumin: 3.5 g/dL (ref 3.5–5.0)
Alkaline Phosphatase: 88 U/L (ref 38–126)
Anion gap: 11 (ref 5–15)
BUN: 23 mg/dL (ref 8–23)
CO2: 23 mmol/L (ref 22–32)
Calcium: 8.8 mg/dL — ABNORMAL LOW (ref 8.9–10.3)
Chloride: 105 mmol/L (ref 98–111)
Creatinine, Ser: 0.6 mg/dL (ref 0.44–1.00)
GFR, Estimated: >60 mL/min (ref >60)
Glucose, Bld: 168 mg/dL — ABNORMAL HIGH (ref 70–99)
Potassium: 3.3 mmol/L — ABNORMAL LOW (ref 3.5–5.1)
Sodium: 139 mmol/L (ref 135–145)
Total Bilirubin: 0.5 mg/dL (ref 0.3–1.2)
Total Protein: 7.2 g/dL (ref 6.5–8.1)

## 2019-12-31 LAB — RESP PANEL BY RT-PCR (FLU A&B, COVID) ARPGX2
Influenza A by PCR: NEGATIVE
Influenza B by PCR: NEGATIVE
SARS Coronavirus 2 by RT PCR: NEGATIVE

## 2019-12-31 LAB — ABO/RH: ABO/RH(D): A POS

## 2019-12-31 LAB — RETICULOCYTES
Immature Retic Fract: 33.4 % — ABNORMAL HIGH (ref 2.3–15.9)
RBC.: 3.3 MIL/uL — ABNORMAL LOW (ref 3.87–5.11)
Retic Count, Absolute: 80.5 10*3/uL (ref 19.0–186.0)
Retic Ct Pct: 2.4 % (ref 0.4–3.1)

## 2019-12-31 LAB — IRON AND TIBC
Iron: 9 ug/dL — ABNORMAL LOW (ref 28–170)
Saturation Ratios: 2 % — ABNORMAL LOW (ref 10.4–31.8)
TIBC: 455 ug/dL — ABNORMAL HIGH (ref 250–450)
UIBC: 446 ug/dL

## 2019-12-31 LAB — PREPARE RBC (CROSSMATCH)

## 2019-12-31 LAB — LACTIC ACID, PLASMA
Lactic Acid, Venous: 1.9 mmol/L (ref 0.5–1.9)
Lactic Acid, Venous: 1.1 mmol/L (ref 0.5–1.9)

## 2019-12-31 LAB — FERRITIN: Ferritin: 5 ng/mL — ABNORMAL LOW (ref 11–307)

## 2019-12-31 LAB — FOLATE: Folate: 43 ng/mL (ref >5.9)

## 2019-12-31 MED ORDER — SODIUM CHLORIDE 0.9 % IV SOLN
1.0000 g | INTRAVENOUS | Status: DC
  Administered 2020-01-01 – 2020-01-02 (×2): 1 g via INTRAVENOUS
  Filled 2019-12-31: qty 1
  Filled 2019-12-31 (×2): qty 10

## 2019-12-31 MED ORDER — PRAVASTATIN SODIUM 20 MG PO TABS
40.0000 mg | ORAL_TABLET | Freq: Every day | ORAL | Status: DC
  Administered 2020-01-01 – 2020-01-02 (×2): 40 mg via ORAL
  Filled 2019-12-31: qty 2
  Filled 2019-12-31: qty 1
  Filled 2019-12-31: qty 2

## 2019-12-31 MED ORDER — AMIODARONE HCL 200 MG PO TABS
200.0000 mg | ORAL_TABLET | Freq: Every day | ORAL | Status: DC
  Administered 2020-01-02 – 2020-01-03 (×2): 200 mg via ORAL
  Filled 2019-12-31 (×4): qty 1

## 2019-12-31 MED ORDER — POTASSIUM CHLORIDE CRYS ER 20 MEQ PO TBCR
40.0000 meq | EXTENDED_RELEASE_TABLET | Freq: Once | ORAL | Status: AC
  Administered 2019-12-31: 40 meq via ORAL
  Filled 2019-12-31: qty 2

## 2019-12-31 MED ORDER — CALCIUM CARBONATE ANTACID 500 MG PO CHEW
1250.0000 mg | CHEWABLE_TABLET | Freq: Every day | ORAL | Status: DC
  Administered 2020-01-03: 1250 mg via ORAL
  Filled 2019-12-31: qty 7

## 2019-12-31 MED ORDER — VANCOMYCIN HCL IN DEXTROSE 1-5 GM/200ML-% IV SOLN
1000.0000 mg | Freq: Once | INTRAVENOUS | Status: AC
  Administered 2019-12-31: 1000 mg via INTRAVENOUS
  Filled 2019-12-31: qty 200

## 2019-12-31 MED ORDER — FOLIC ACID 1 MG PO TABS
1000.0000 ug | ORAL_TABLET | Freq: Every day | ORAL | Status: DC
  Administered 2020-01-02 – 2020-01-03 (×2): 1 mg via ORAL
  Filled 2019-12-31 (×3): qty 1

## 2019-12-31 MED ORDER — ONDANSETRON HCL 4 MG PO TABS: 4.0000 mg | ORAL_TABLET | Freq: Four times a day (QID) | ORAL | Status: DC | PRN

## 2019-12-31 MED ORDER — METOPROLOL TARTRATE 25 MG PO TABS
25.0000 mg | ORAL_TABLET | Freq: Two times a day (BID) | ORAL | Status: DC
  Administered 2020-01-01 – 2020-01-03 (×4): 25 mg via ORAL
  Filled 2019-12-31 (×5): qty 1

## 2019-12-31 MED ORDER — ASCORBIC ACID 500 MG PO TABS
1000.0000 mg | ORAL_TABLET | Freq: Every day | ORAL | Status: DC
  Administered 2020-01-01 – 2020-01-03 (×3): 1000 mg via ORAL
  Filled 2019-12-31 (×4): qty 2

## 2019-12-31 MED ORDER — ONDANSETRON HCL 4 MG/2ML IJ SOLN: 4.0000 mg | Freq: Four times a day (QID) | INTRAMUSCULAR | Status: DC | PRN

## 2019-12-31 MED ORDER — PIPERACILLIN-TAZOBACTAM 3.375 G IVPB 30 MIN
3.3750 g | Freq: Once | INTRAVENOUS | Status: AC
  Administered 2019-12-31: 3.375 g via INTRAVENOUS
  Filled 2019-12-31: qty 50

## 2019-12-31 MED ORDER — SODIUM CHLORIDE 0.9 % IV SOLN: 10.0000 mL/h | Freq: Once | INTRAVENOUS | Status: DC

## 2019-12-31 MED ORDER — SODIUM CHLORIDE 0.9 % IV SOLN: 1.0000 g | INTRAVENOUS | Status: DC

## 2019-12-31 MED ORDER — ADULT MULTIVITAMIN W/MINERALS CH
1.0000 | ORAL_TABLET | Freq: Every day | ORAL | Status: DC
  Administered 2020-01-01 – 2020-01-03 (×3): 1 via ORAL
  Filled 2019-12-31 (×2): qty 1

## 2019-12-31 MED ORDER — ACETAMINOPHEN 650 MG RE SUPP: 325.0000 mg | Freq: Four times a day (QID) | RECTAL | Status: DC | PRN

## 2019-12-31 MED ORDER — ACETAMINOPHEN 325 MG PO TABS: 325.0000 mg | ORAL_TABLET | Freq: Four times a day (QID) | ORAL | Status: DC | PRN

## 2019-12-31 MED ORDER — POLYETHYLENE GLYCOL 3350 17 G PO PACK
17.0000 g | PACK | Freq: Every day | ORAL | Status: DC
  Administered 2020-01-01 – 2020-01-03 (×3): 17 g via ORAL
  Filled 2019-12-31 (×3): qty 1

## 2019-12-31 NOTE — H&P (Addendum)
History and Physical   CARESSE SEDIVY WUJ:[id number] DOB: 12-25-1929 DOA: 12/31/2019  PCP: [name], MD  Outpatient Specialists: [facility] Cardiology, Dr. Nehemiah Massed Patient coming from: home  I have personally briefly reviewed patient's old medical records in [place].  Chief Concern: left lower extremity wound  HPI: [name] BAISE is a 86 y.o. female with medical history significant for paroxysmal atrial fibrillation, former tobacco user, hyperlipidemia, presents to ED for chief concern of left lower extremity leg wound that started about 5-7 days prior.   She reports oozing left lower extremity wound that has been worsening over the last two days.  He endorses some bleeding from her wound as well.  She states she stopped Eliquis on Sunday due to the wound bleeding.  She denies trauma, bug bites. She endorses prior dry skin to the area.   She reports that both her and her son presented to the urgent care center and was sent to the emergency department and both are getting admitted to the hospital.  Social history: lives by herself and her son comes to help her. 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 She quit smoking tobacco in her 67s. She denies current use of etoh and recreational drugs.  ED Course: Discussed with ED provider, requesting admission for cellulitis.  Review of Systems: As per HPI otherwise 10 point review of systems negative.  Assessment/Plan  Active Problems:   Wound cellulitis   Wound cellulitis- -Checking MRSA -Status post vancomycin and Zosyn per ED provider -Continue ceftriaxone -If MRSA positive will change antibiotic appropriately -ED provider ordered superficial wound culture -Checking sed rate and CRP  Atrial fibrillation-rate controlled with amiodarone 200 mg p.o. daily, metoprolol 25 mg twice daily -Patient self discontinued her home Eliquis on Sunday, 12/27/2019 due to bleeding to her  wounds  Hyperlipidemia-resumed home statin medications  Symptomatic anemia-status post 1 unit packed red blood cells per ED provider -Differential include blood loss versus iron deficiency anemia -Anemia panel -CBC in the a.m.  Chart reviewed.   DVT prophylaxis: Due to suspicion of acute bleed Code Status: full code  Diet: Cardiac Family Communication: son is only living relative and he is hospitalized and he knows she is being hospitalized. Disposition Plan: pending clinical course  Consults called: wound consult Admission status: observation   Past Medical History:  Diagnosis Date  . Anxiety   . Cancer ([place])    Squamous Cell Carcinoma (top of head)  . Constipation, chronic   . Deaf, left   . Diverticular disease   . Hearing aid worn   . Hearing deficit   . History of Bell's palsy    left side - resolved - 50 yrs ago  . Hypertension   . Internal hemorrhoids   . Osteopenia   . Pure hypercholesterolemia    Past Surgical History:  Procedure Laterality Date  . CATARACT EXTRACTION W/PHACO Left 11/16/2015   Procedure: CATARACT EXTRACTION PHACO AND INTRAOCULAR LENS PLACEMENT (IOC);  Surgeon: Leandrew Koyanagi, MD;  Location: [facility];  Service: Ophthalmology;  Laterality: Left;  TORIC  . CATARACT EXTRACTION W/PHACO Right 12/07/2015   Procedure: CATARACT EXTRACTION PHACO AND INTRAOCULAR LENS PLACEMENT (IOC);  Surgeon: Leandrew Koyanagi, MD;  Location: [facility];  Service: Ophthalmology;  Laterality: Right;  TORIC  . COLONOSCOPY WITH PROPOFOL N/A 08/10/2014   Procedure: COLONOSCOPY WITH PROPOFOL;  Surgeon: Lollie Sails, MD;  Location: [facility] ENDOSCOPY;  Service: Endoscopy;  Laterality: N/A;  . FEMUR FRACTURE SURGERY    .  Nasal Polyp Removal     Social History:  reports that she quit smoking about 34 years ago. She has never used smokeless tobacco. She reports that she does not drink alcohol and does not use drugs.  Allergies  Allergen Reactions  .  Bactrim [Sulfamethoxazole-Trimethoprim] Other (See Comments)    Hearing loss  . Ciprofloxacin Other (See Comments)    Flu-like symptoms  . Pneumococcal Vaccines Other (See Comments)    Flu-like symptoms   No family history on file. Family history: Family history reviewed and not pertinent  Prior to Admission medications   Medication Sig Start Date End Date Taking? Authorizing Provider  amiodarone (PACERONE) 200 MG tablet Take 1 tablet (200 mg total) by mouth daily. 08/29/19   Sharen Hones, MD  apixaban (ELIQUIS) 2.5 MG TABS tablet Take 1 tablet (2.5 mg total) by mouth 2 (two) times daily. 08/28/19   Sharen Hones, MD  ascorbic acid (VITAMIN C) 1000 MG tablet Take 1,000 mg by mouth daily.    [provider]  aspirin EC 81 MG EC tablet Take 1 tablet (81 mg total) by mouth daily. Swallow whole. 08/29/19   Sharen Hones, MD  calcium carbonate 1250 MG capsule Take 1,250 mg by mouth daily.    [provider]  fluticasone (FLONASE) 50 MCG/ACT nasal spray Place 2 sprays into both nostrils daily as needed for rhinitis.    [provider]  folic acid (FOLVITE) 756 MCG tablet Take 800 mcg by mouth daily.    [provider]  Glucosamine HCl (GLUCOSAMINE PO) Take 1 Dose by mouth daily.    [provider]  lidocaine (LIDODERM) 5 % Place 1 patch onto the skin every 12 (twelve) hours. Remove & Discard patch within 12 hours or as directed by MD Patient not taking: Reported on 08/26/2019 07/23/19 07/22/20  Sable Feil, PA-C  lovastatin (MEVACOR) 40 MG tablet Take 40 mg by mouth at bedtime.    [provider]  metoprolol tartrate (LOPRESSOR) 25 MG tablet Take 1 tablet (25 mg total) by mouth 2 (two) times daily. 08/28/19   Sharen Hones, MD  Multiple Vitamin (MULTIVITAMIN) tablet Take 1 tablet by mouth daily.    [provider]  oxyCODONE-acetaminophen (PERCOCET/ROXICET) 5-325 MG tablet Take 1 tablet by mouth every 6 (six) hours as needed for moderate  pain or severe pain. 08/28/19   Sharen Hones, MD  polyethylene glycol (MIRALAX / GLYCOLAX) 17 g packet Take 17 g by mouth daily.     [provider]  tretinoin (RETIN-A) 0.05 % cream Apply 1 Dose topically at bedtime.    [provider]   Physical Exam: Vitals:   12/31/19 1814 12/31/19 1815 12/31/19 2000  BP: (!) 163/48  (!) 144/69  Pulse: (!) 101  88  Resp: 18  16  Temp: 98.2 F (36.8 C)    TempSrc: Oral    SpO2: 93%  96%  Weight:  56.7 kg   Height:  5\' 3"  (1.6 m)    Constitutional: appears younger than chronological age, NAD, calm, comfortable Eyes: PERRL, lids and conjunctivae normal ENMT: Mucous membranes are moist. Posterior pharynx clear of any exudate or lesions. Age-appropriate dentition. Hearing loss Neck: normal, supple, no masses, no thyromegaly Respiratory: clear to auscultation bilaterally, no wheezing, no crackles. Normal respiratory effort. No accessory muscle use.  Cardiovascular: Regular rate and rhythm, no murmurs / rubs / gallops. No extremity edema. 2+ pedal pulses. No carotid bruits.  Abdomen: no tenderness, no masses palpated, no hepatosplenomegaly. Bowel  sounds positive.  Musculoskeletal: no clubbing / cyanosis. No joint deformity upper and lower extremities. Good ROM, no contractures, no atrophy. Normal muscle tone.  Skin: Bilateral lower extremity swelling and wound.  Left lower extremity anterior wound with ulcerations and granulation Neurologic: Sensation intact. Strength 5/5 in all 4.  Psychiatric: Normal judgment and insight. Alert and oriented x 3. Normal mood.   EKG: Independently reviewed, showing sinus rhythm with a rate of 96, QTC of 447  Chest x-ray on Admission: Personally reviewed and I agree with radiologist reading as below.  DG Tibia/Fibula Left  Result Date: 12/31/2019 CLINICAL DATA:  Left leg wound evaluate for osteomyelitis EXAM: LEFT TIBIA AND FIBULA - 2 VIEW COMPARISON:  None. FINDINGS: There is diffuse edematous  swelling of the lower extremity. More focal irregularity and ulceration with superficial cutaneous lucency and overlying bandaging material is seen along the anterior aspect of the distal lower leg, superficial to the lower third tibial diaphysis. No other soft tissue gas or foreign body. No subjacent osseous erosion, destruction or periostitis changes are seen to suggest underlying osteomyelitis at this location or elsewhere within the lower extremity. Mild-to-moderate tricompartmental degenerative changes are noted in the knee with chondrocalcinosis of the medial and lateral menisci. Additional mild degenerative changes of the ankle. No sizable ankle effusion. IMPRESSION: 1. Soft tissue irregularity and ulceration along the anterior aspect of the distal lower leg, superficial to the lower third tibial diaphysis. No radiographic evidence of osteomyelitis. 2. Diffuse edematous swelling of the lower extremity. 3. Degenerative changes in the knee and ankle. 4. 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 Electronically Signed   By: Lovena Le M.D.   On: 12/31/2019 20:27   DG Chest Portable 1 View  Result Date: 12/31/2019 CLINICAL DATA:  86 year old female with weakness. EXAM: PORTABLE CHEST 1 VIEW COMPARISON:  Chest radiograph dated 07/26/2019. FINDINGS: There is background of emphysema and chronic interstitial coarsening and bronchitic changes. No focal consolidation, pleural effusion or pneumothorax. The cardiac silhouette is within limits. Atherosclerotic calcification of the aorta. No acute osseous pathology. Osteopenia with degenerative changes of the spine. IMPRESSION: No active disease. Electronically Signed   By: Anner Crete M.D.   On: 12/31/2019 20:27   Labs on Admission: I have personally reviewed following labs  CBC: Recent Labs  Lab 12/31/19 1817  WBC 8.5  NEUTROABS 5.1  HGB 7.2*  HCT 25.0*  MCV 78.9*  PLT 803   Basic Metabolic  Panel: Recent Labs  Lab 12/31/19 1817  NA 139  K 3.3*  CL 105  CO2 23  GLUCOSE 168*  BUN 23  CREATININE 0.60  CALCIUM 8.8*   GFR: Estimated Creatinine Clearance: 40.2 mL/min (by C-G formula based on SCr of 0.6 mg/dL). Liver Function Tests: Recent Labs  Lab 12/31/19 1817  AST 21  ALT 14  ALKPHOS 88  BILITOT 0.5  PROT 7.2  ALBUMIN 3.5   Anemia Panel: Recent Labs    12/31/19 1955  RETICCTPCT 2.4   Urine analysis:    Component Value Date/Time   COLORURINE YELLOW (A) 08/25/2019 2115   APPEARANCEUR HAZY (A) 08/25/2019 2115   APPEARANCEUR Cloudy 05/31/2012 2019   LABSPEC 1.011 08/25/2019 2115   LABSPEC 1.011 05/31/2012 2019   PHURINE 5.0 08/25/2019 2115   GLUCOSEU NEGATIVE 08/25/2019 2115   GLUCOSEU 50 mg/dL 05/31/2012 2019   HGBUR NEGATIVE 08/25/2019 2115   BILIRUBINUR NEGATIVE 08/25/2019 2115   BILIRUBINUR Negative 05/31/2012 2019   KETONESUR 5 (A) 08/25/2019 2115  PROTEINUR NEGATIVE 08/25/2019 2115   NITRITE NEGATIVE 08/25/2019 2115   LEUKOCYTESUR NEGATIVE 08/25/2019 2115   LEUKOCYTESUR Negative 05/31/2012 2019   Angelyn Osterberg N Boysie Bonebrake D.O. [facility]  If 12AM-7AM, please contact overnight-coverage provider If 7AM-7PM, please contact day coverage provider [email or web address]  12/31/2019, 9:28 PM

## 2019-12-31 NOTE — ED Notes (Signed)
Dr. Tobie Poet at bedside. Patient ambulated to hallway bathroom and voided into a hat in the commode. Patient put toilet paper in the hat so specimen was dumped. Patient was instructed not to put the toilet paper in the hat next time. Report given to [place] at [place].

## 2019-12-31 NOTE — ED Triage Notes (Signed)
Pt to ED POV from [facility] for anemia and check to wound on left leg.  Hgb from [place] 7.3.  Wound to right leg, pt reports it came from blood thinners and has been weeping.   Skin color WDL.  Ambulatory to triage. RR even and unlabored.

## 2019-12-31 NOTE — ED Provider Notes (Signed)
[facility] Emergency Department Provider Note    First MD Initiated Contact with Patient 12/31/19 1944     (approximate)  I have reviewed the triage vital signs and the nursing notes.   HISTORY  Chief Complaint Anemia and leg infection    HPI [name] [name] is a 86 y.o. female patient with the below listed past medical history on Eliquis for history of A. fib presents to the ER for generalized malaise weakness and worsening pain of the left leg associated with wound and worsening ulceration with increased purulent drainage.  Not having measured fevers.  Is not been on recent antibiotics.  States that over the past few days has become more tender hot and warm started having some foul-smelling discharge.  Was in clinic today for evaluation was sent over to the ER after blood work showed evidence of acute anemia as well as concerning wounds.  She states that she does have intermittent bleeding from the wounds.  She denies any melena or hematochezia.  No recent trauma.  No abdominal pain.    Past Medical History:  Diagnosis Date  . Anxiety   . Cancer ([place])    Squamous Cell Carcinoma (top of head)  . Constipation, chronic   . Deaf, left   . Diverticular disease   . Hearing aid worn   . Hearing deficit   . History of Bell's palsy    left side - resolved - 50 yrs ago  . Hypertension   . Internal hemorrhoids   . Osteopenia   . Pure hypercholesterolemia    No family history on file. Past Surgical History:  Procedure Laterality Date  . CATARACT EXTRACTION W/PHACO Left 11/16/2015   Procedure: CATARACT EXTRACTION PHACO AND INTRAOCULAR LENS PLACEMENT (IOC);  Surgeon: Leandrew Koyanagi, MD;  Location: [place];  Service: Ophthalmology;  Laterality: Left;  TORIC  . CATARACT EXTRACTION W/PHACO Right 12/07/2015   Procedure: CATARACT EXTRACTION PHACO AND INTRAOCULAR LENS PLACEMENT (IOC);  Surgeon: Leandrew Koyanagi, MD;  Location: [place];  Service: Ophthalmology;  Laterality: Right;  TORIC  . COLONOSCOPY WITH PROPOFOL N/A 08/10/2014   Procedure: COLONOSCOPY WITH PROPOFOL;  Surgeon: Lollie Sails, MD;  Location: [facility] ENDOSCOPY;  Service: Endoscopy;  Laterality: N/A;  . FEMUR FRACTURE SURGERY    . Nasal Polyp Removal     Patient Active Problem List   Diagnosis Date Noted  . Pelvic fracture ([place]) 08/25/2019  . Atrial fibrillation ([place]) 08/25/2019  . Pain   . Dehydration   . Accidental fall 07/26/2019  . UTI (urinary tract infection) 07/26/2019  . Closed fracture of right inferior pubic ramus ([place]) 07/26/2019  . HTN (hypertension) 07/26/2019  . Ambulatory dysfunction 07/26/2019  . Pubic bone fracture ([place]) 07/26/2019      Prior to Admission medications   Medication Sig Start Date End Date Taking? Authorizing Provider  amiodarone (PACERONE) 200 MG tablet Take 1 tablet (200 mg total) by mouth daily. 08/29/19   Sharen Hones, MD  apixaban (ELIQUIS) 2.5 MG TABS tablet Take 1 tablet (2.5 mg total) by mouth 2 (two) times daily. 08/28/19   Sharen Hones, MD  ascorbic acid (VITAMIN C) 1000 MG tablet Take 1,000 mg by mouth daily.    [provider]  aspirin EC 81 MG EC tablet Take 1 tablet (81 mg total) by mouth daily. Swallow whole. 08/29/19   Sharen Hones, MD  calcium carbonate 1250 MG capsule Take 1,250 mg by mouth daily.    [provider]  fluticasone (FLONASE) 50 MCG/ACT nasal spray Place 2 sprays into both nostrils daily as needed for rhinitis.    [provider]  folic acid (FOLVITE) 628 MCG tablet Take 800 mcg by mouth daily.    [provider]  Glucosamine HCl (GLUCOSAMINE PO) Take 1 Dose by mouth daily.    [provider]  lidocaine (LIDODERM) 5 % Place 1 patch onto the skin every 12 (twelve) hours. Remove & Discard patch within 12 hours or as directed by MD Patient not taking: Reported on 08/26/2019 07/23/19 07/22/20  Sable Feil, PA-C  lovastatin (MEVACOR) 40 MG  tablet Take 40 mg by mouth at bedtime.    [provider]  metoprolol tartrate (LOPRESSOR) 25 MG tablet Take 1 tablet (25 mg total) by mouth 2 (two) times daily. 08/28/19   Sharen Hones, MD  Multiple Vitamin (MULTIVITAMIN) tablet Take 1 tablet by mouth daily.    [provider]  oxyCODONE-acetaminophen (PERCOCET/ROXICET) 5-325 MG tablet Take 1 tablet by mouth every 6 (six) hours as needed for moderate pain or severe pain. 08/28/19   Sharen Hones, MD  polyethylene glycol (MIRALAX / GLYCOLAX) 17 g packet Take 17 g by mouth daily.     [provider]  tretinoin (RETIN-A) 0.05 % cream Apply 1 Dose topically at bedtime.    [provider]    Allergies Bactrim [sulfamethoxazole-trimethoprim], Ciprofloxacin, and Pneumococcal vaccines    Social History Social History   Tobacco Use  . Smoking status: Former Smoker    Quit date: 10/30/1985    Years since quitting: 34.1  . Smokeless tobacco: Never Used  Substance Use Topics  . Alcohol use: No  . Drug use: No    Review of Systems Patient denies headaches, rhinorrhea, blurry vision, numbness, shortness of breath, chest pain, edema, cough, abdominal pain, nausea, vomiting, diarrhea, dysuria, fevers, rashes or hallucinations unless otherwise stated above in HPI. ____________________________________________   PHYSICAL EXAM:  VITAL SIGNS: Vitals:   12/31/19 1814 12/31/19 2000  BP: (!) 163/48 (!) 144/69  Pulse: (!) 101 88  Resp: 18 16  Temp: 98.2 F (36.8 C)   SpO2: 93% 96%    Constitutional: Alert and oriented.  Eyes: Conjunctivae are pale Head: Atraumatic. Nose: No congestion/rhinnorhea. Mouth/Throat: Mucous membranes are moist.   Neck: No stridor. Painless ROM.  Cardiovascular: Normal rate, irregular rhythm. Grossly normal heart sounds.  Good peripheral circulation. Respiratory: Normal respiratory effort.  No retractions. Lungs CTAB. Gastrointestinal: Soft and nontender. No distention. No  abdominal bruits. No CVA tenderness. Genitourinary:  Musculoskeletal: Left lower extremity with tenderness to palpation with to anterior shin ulcerations with purulent drainage no joint effusions. Neurologic:  Normal speech and language. No gross focal neurologic deficits are appreciated. No facial droop Skin:  Skin is warm, dry and intact. No rash noted. Psychiatric: Mood and affect are normal. Speech and behavior are normal.  ____________________________________________   LABS (all labs ordered are listed, but only abnormal results are displayed)  Results for orders placed or performed during the hospital encounter of 12/31/19 (from the past 24 hour(s))  Lactic acid, plasma     Status: None   Collection Time: 12/31/19  6:17 PM  Result Value Ref Range   Lactic Acid, Venous 1.9 0.5 - 1.9 mmol/L  Comprehensive metabolic panel     Status: Abnormal   Collection Time: 12/31/19  6:17 PM  Result Value Ref Range   Sodium 139 135 - 145 mmol/L   Potassium 3.3 (L) 3.5 - 5.1 mmol/L  Chloride 105 98 - 111 mmol/L   CO2 23 22 - 32 mmol/L   Glucose, Bld 168 (H) 70 - 99 mg/dL   BUN 23 8 - 23 mg/dL   Creatinine, Ser 0.60 0.44 - 1.00 mg/dL   Calcium 8.8 (L) 8.9 - 10.3 mg/dL   Total Protein 7.2 6.5 - 8.1 g/dL   Albumin 3.5 3.5 - 5.0 g/dL   AST 21 15 - 41 U/L   ALT 14 0 - 44 U/L   Alkaline Phosphatase 88 38 - 126 U/L   Total Bilirubin 0.5 0.3 - 1.2 mg/dL   GFR, Estimated >60 >60 mL/min   Anion gap 11 5 - 15  CBC with Differential     Status: Abnormal   Collection Time: 12/31/19  6:17 PM  Result Value Ref Range   WBC 8.5 4.0 - 10.5 K/uL   RBC 3.17 (L) 3.87 - 5.11 MIL/uL   Hemoglobin 7.2 (L) 12.0 - 15.0 g/dL   HCT 25.0 (L) 36 - 46 %   MCV 78.9 (L) 80.0 - 100.0 fL   MCH 22.7 (L) 26.0 - 34.0 pg   MCHC 28.8 (L) 30.0 - 36.0 g/dL   RDW 17.1 (H) 11.5 - 15.5 %   Platelets 365 150 - 400 K/uL   nRBC 0.0 0.0 - 0.2 %   Neutrophils Relative % 60 %   Neutro Abs 5.1 1.7 - 7.7 K/uL   Lymphocytes  Relative 21 %   Lymphs Abs 1.8 0.7 - 4.0 K/uL   Monocytes Relative 14 %   Monocytes Absolute 1.2 (H) 0.1 - 1.0 K/uL   Eosinophils Relative 4 %   Eosinophils Absolute 0.3 0.0 - 0.5 K/uL   Basophils Relative 1 %   Basophils Absolute 0.1 0.0 - 0.1 K/uL   Immature Granulocytes 0 %   Abs Immature Granulocytes 0.03 0.00 - 0.07 K/uL  Type and screen [facility]     Status: None   Collection Time: 12/31/19  6:20 PM  Result Value Ref Range   ABO/RH(D) A POS    Antibody Screen NEG    Sample Expiration      01/03/2020,2359 Performed at [facility], [address] [place], [place] [address]   Reticulocytes     Status: Abnormal   Collection Time: 12/31/19  7:55 PM  Result Value Ref Range   Retic Ct Pct 2.4 0.4 - 3.1 %   RBC. 3.30 (L) 3.87 - 5.11 MIL/uL   Retic Count, Absolute 80.5 19.0 - 186.0 K/uL   Immature Retic Fract 33.4 (H) 2.3 - 15.9 %  Lactic acid, plasma     Status: None   Collection Time: 12/31/19  8:09 PM  Result Value Ref Range   Lactic Acid, Venous 1.1 0.5 - 1.9 mmol/L   ____________________________________________  EKG My review and personal interpretation at Time: 18:21   Indication: weakness  Rate: 95  Rhythm: sinus Axis: normal Other: normal intervals, no stemi ____________________________________________  RADIOLOGY  I personally reviewed all radiographic images ordered to evaluate for the above acute complaints and reviewed radiology reports and findings.  These findings were personally discussed with the patient.  Please see medical record for radiology report.  ____________________________________________   PROCEDURES  Procedure(s) performed:  Procedures    Critical Care performed: no ____________________________________________   INITIAL IMPRESSION / ASSESSMENT AND PLAN / ED COURSE  Pertinent labs & imaging results that were available during my care of the patient were reviewed by me and considered in my medical  decision making (see chart for details).   DDX: Cellulitis, abscess, venous stasis, ulceration, Qbrelis anemia, iron deficiency anemia, GI bleed, anemia chronic disease  Aquarius H Ante is a 86 y.o. who presents to the ED with presentation as described above.  She is afebrile hemodynamically stable but has fairly impressive ulcerative changes to the left leg with surrounding cellulitis and purulent drainage.  Blood work also shows evidence of acute anemia.  It sounds like patient was having pretty significant bleeding from these open wounds and stopped taking her Eliquis.  She is denying any melena or hematochezia.  She is describing symptoms of symptomatic anemia therefore I will transfuse.  She also will require IV antibiotics given the extent of her cellulitis with open wounds.  X-ray does not show any evidence of osteomyelitis by think she will also benefit from wound consult.  Have discussed with the patient and available family all diagnostics and treatments performed thus far and all questions were answered to the best of my ability. The patient demonstrates understanding and agreement with plan.      The patient was evaluated in Emergency Department today for the symptoms described in the history of present illness. He/she was evaluated in the context of the global COVID-19 pandemic, which necessitated consideration that the patient might be at risk for infection with the SARS-CoV-2 virus that causes COVID-19. Institutional protocols and algorithms that pertain to the evaluation of patients at risk for COVID-19 are in a state of rapid change based on information released by regulatory bodies including the CDC and federal and state organizations. These policies and algorithms were followed during the patient's care in the ED.  As part of my medical decision making, I reviewed the following data within the [place] notes reviewed and incorporated, Labs reviewed, notes  from prior ED visits and [place] Controlled Substance Database   ____________________________________________   FINAL CLINICAL IMPRESSION(S) / ED DIAGNOSES  Final diagnoses:  Cellulitis of left lower extremity  Anemia, unspecified type      NEW MEDICATIONS STARTED DURING THIS VISIT:  New Prescriptions   No medications on file     Note:  This document was prepared using Dragon voice recognition software and may include unintentional dictation errors.    Merlyn Lot, MD 12/31/19 2105

## 2019-12-31 NOTE — Progress Notes (Signed)
PHARMACY -  BRIEF ANTIBIOTIC NOTE   Pharmacy has received consult(s) for vancomycin from an ED provider.  The patient's profile has been reviewed for ht/wt/allergies/indication/available labs.    One time order(s) placed for vancomycin 1000 mg IV  Further antibiotics/pharmacy consults should be ordered by admitting physician if indicated.                       Thank you,  Tawnya Crook, PharmD 12/31/2019  8:54 PM

## 2020-01-01 DIAGNOSIS — E876 Hypokalemia: Secondary | ICD-10-CM

## 2020-01-01 DIAGNOSIS — L0390 Cellulitis, unspecified: Secondary | ICD-10-CM | POA: Diagnosis not present

## 2020-01-01 DIAGNOSIS — Z79899 Other long term (current) drug therapy: Secondary | ICD-10-CM | POA: Diagnosis not present

## 2020-01-01 DIAGNOSIS — Z9842 Cataract extraction status, left eye: Secondary | ICD-10-CM | POA: Diagnosis not present

## 2020-01-01 DIAGNOSIS — I10 Essential (primary) hypertension: Secondary | ICD-10-CM | POA: Diagnosis present

## 2020-01-01 DIAGNOSIS — H9192 Unspecified hearing loss, left ear: Secondary | ICD-10-CM | POA: Diagnosis present

## 2020-01-01 DIAGNOSIS — Z20822 Contact with and (suspected) exposure to covid-19: Secondary | ICD-10-CM | POA: Diagnosis present

## 2020-01-01 DIAGNOSIS — I4821 Permanent atrial fibrillation: Secondary | ICD-10-CM

## 2020-01-01 DIAGNOSIS — Z7901 Long term (current) use of anticoagulants: Secondary | ICD-10-CM | POA: Diagnosis not present

## 2020-01-01 DIAGNOSIS — L03116 Cellulitis of left lower limb: Secondary | ICD-10-CM | POA: Diagnosis present

## 2020-01-01 DIAGNOSIS — E785 Hyperlipidemia, unspecified: Secondary | ICD-10-CM | POA: Diagnosis present

## 2020-01-01 DIAGNOSIS — E7800 Pure hypercholesterolemia, unspecified: Secondary | ICD-10-CM | POA: Diagnosis present

## 2020-01-01 DIAGNOSIS — Z85828 Personal history of other malignant neoplasm of skin: Secondary | ICD-10-CM | POA: Diagnosis not present

## 2020-01-01 DIAGNOSIS — L89899 Pressure ulcer of other site, unspecified stage: Secondary | ICD-10-CM | POA: Diagnosis present

## 2020-01-01 DIAGNOSIS — D500 Iron deficiency anemia secondary to blood loss (chronic): Secondary | ICD-10-CM

## 2020-01-01 DIAGNOSIS — Z888 Allergy status to other drugs, medicaments and biological substances status: Secondary | ICD-10-CM | POA: Diagnosis not present

## 2020-01-01 DIAGNOSIS — Z87891 Personal history of nicotine dependence: Secondary | ICD-10-CM | POA: Diagnosis not present

## 2020-01-01 DIAGNOSIS — Z961 Presence of intraocular lens: Secondary | ICD-10-CM | POA: Diagnosis present

## 2020-01-01 DIAGNOSIS — Z9841 Cataract extraction status, right eye: Secondary | ICD-10-CM | POA: Diagnosis not present

## 2020-01-01 LAB — VITAMIN B12: Vitamin B-12: 942 pg/mL — ABNORMAL HIGH (ref 180–914)

## 2020-01-01 LAB — BASIC METABOLIC PANEL
Anion gap: 11 (ref 5–15)
BUN: 18 mg/dL (ref 8–23)
CO2: 23 mmol/L (ref 22–32)
Calcium: 8.4 mg/dL — ABNORMAL LOW (ref 8.9–10.3)
Chloride: 106 mmol/L (ref 98–111)
Creatinine, Ser: 0.57 mg/dL (ref 0.44–1.00)
GFR, Estimated: >60 mL/min (ref >60)
Glucose, Bld: 85 mg/dL (ref 70–99)
Potassium: 3.7 mmol/L (ref 3.5–5.1)
Sodium: 140 mmol/L (ref 135–145)

## 2020-01-01 LAB — CBC
HCT: 27.8 % — ABNORMAL LOW (ref 36.0–46.0)
Hemoglobin: 8.3 g/dL — ABNORMAL LOW (ref 12.0–15.0)
MCH: 23.9 pg — ABNORMAL LOW (ref 26.0–34.0)
MCHC: 29.9 g/dL — ABNORMAL LOW (ref 30.0–36.0)
MCV: 80.1 fL (ref 80.0–100.0)
Platelets: 294 10*3/uL (ref 150–400)
RBC: 3.47 MIL/uL — ABNORMAL LOW (ref 3.87–5.11)
RDW: 17.6 % — ABNORMAL HIGH (ref 11.5–15.5)
WBC: 8.7 10*3/uL (ref 4.0–10.5)
nRBC: 0 % (ref 0.0–0.2)

## 2020-01-01 LAB — SEDIMENTATION RATE: Sed Rate: 63 mm/hr — ABNORMAL HIGH (ref 0–30)

## 2020-01-01 LAB — IRON AND TIBC
Iron: 23 ug/dL — ABNORMAL LOW (ref 28–170)
Saturation Ratios: 6 % — ABNORMAL LOW (ref 10.4–31.8)
TIBC: 398 ug/dL (ref 250–450)
UIBC: 375 ug/dL

## 2020-01-01 LAB — C-REACTIVE PROTEIN: CRP: 1.2 mg/dL — ABNORMAL HIGH (ref <1.0)

## 2020-01-01 MED ORDER — MUPIROCIN 2 % EX OINT
TOPICAL_OINTMENT | Freq: Every day | CUTANEOUS | Status: DC
  Filled 2020-01-01 (×2): qty 22

## 2020-01-01 MED ORDER — SODIUM CHLORIDE 0.9 % IV SOLN
300.0000 mg | Freq: Once | INTRAVENOUS | Status: AC
  Administered 2020-01-01: 300 mg via INTRAVENOUS
  Filled 2020-01-01 (×2): qty 15

## 2020-01-01 MED ORDER — ENOXAPARIN SODIUM 40 MG/0.4ML ~~LOC~~ SOLN
40.0000 mg | SUBCUTANEOUS | Status: DC
  Administered 2020-01-01: 40 mg via SUBCUTANEOUS
  Filled 2020-01-01: qty 0.4

## 2020-01-01 MED ORDER — VANCOMYCIN HCL 750 MG/150ML IV SOLN
750.0000 mg | INTRAVENOUS | Status: DC
  Administered 2020-01-01 – 2020-01-02 (×2): 750 mg via INTRAVENOUS
  Filled 2020-01-01 (×3): qty 150

## 2020-01-01 NOTE — ED Notes (Signed)
Pt's hearing aids placed in her purse per request.

## 2020-01-01 NOTE — Consult Note (Signed)
Pharmacy Antibiotic Note  [name] [name] is a 86 y.o. female admitted on 12/31/2019 with cellulitis.  Pharmacy has been consulted for Vancomycin dosing. At baseline creatinine is 0.6, currently 0.57mg /dl. Patient already loaded and will need maintenance starting in 20hrs to target 10-65mcg/ml for cellulitis. Borderline with CrCl of 40.25ml/min for dosing 500-1000mg  q24h. Calculated that 750mg  Q24H should be sufficient, but will continue to monitor renal function.  Plan: Loading dose - Vancomycin 1g 12/3 (0138) Maintenance dose - 750mg  q24h (12/3 2200>>  Height: 5\' 3"  (160 cm) Weight: 56.7 kg (125 lb) IBW/kg (Calculated) : 52.4  Temp (24hrs), Avg:98 F (36.7 C), Min:97.9 F (36.6 C), Max:98.2 F (36.8 C)  Recent Labs  Lab 12/31/19 1817 12/31/19 2009 01/01/20 0427  WBC 8.5  --  8.7  CREATININE 0.60  --  0.57  LATICACIDVEN 1.9 1.1  --     Estimated Creatinine Clearance: 40.2 mL/min (by C-G formula based on SCr of 0.57 mg/dL).    Allergies  Allergen Reactions  . Bactrim [Sulfamethoxazole-Trimethoprim] Other (See Comments)    Hearing loss  . Ciprofloxacin Other (See Comments)    Flu-like symptoms  . Pneumococcal Vaccines Other (See Comments)    Flu-like symptoms    Antimicrobials this admission: Ceftriaxone 1g q24h (12/2>> Vanc 750mg  q24h 12/3>>  Dose adjustments this admission: n/a  Microbiology results: 12/2 Wound Cx - WBC and GPC in pairs 12/2 MRSA PCR: pending  Thank you for allowing pharmacy to be a part of this patient's care.  Shanon Brow Amandy Chubbuck 01/01/2020 1:38 PM

## 2020-01-01 NOTE — Consult Note (Signed)
[facility] Nurse Consult Note: Reason for Consult: Left anterior lower leg wound.  DUration of at least one week.   Wound type:infectious Pressure Injury POA: Yes Measurement: 3 open wounds, 1 cm round each with surrounding erythema tenderness and purulent drainage noted.  Wound YSA:YTKZSW Drainage (amount, consistency, odor) moderate purulence Periwound:erythema and edema.  Dressing procedure/placement/frequency: Cleanse wounds to left lower leg with NS and pat dry. Apply mupirocin ointment to wound bed. Cover with nonadherent telfa and wrap with kerlix and tape.  Change daily.  Will not follow at this time.  Please re-consult if needed.  Domenic Moras MSN, RN, FNP-BC CWON Wound, Ostomy, Continence Nurse Pager [phone]

## 2020-01-01 NOTE — Progress Notes (Signed)
PROGRESS NOTE    [name] [name]  DGL:[id number] DOB: 25-Sep-1929 DOA: 12/31/2019 PCP: [name], MD   Chief complaint left leg swelling and redness. Brief Narrative:  [name] [name] is a 86 y.o. female with medical history significant for paroxysmal atrial fibrillation, former tobacco user, hyperlipidemia, presents to ED for chief concern of left lower extremity leg wound that started about 5-7 days prior.   In the emergency room, he was given vancomycin and Zosyn, followed by ceftriaxone.   Assessment & Plan:   Active Problems:   Wound cellulitis  #1.  Left leg wound with cellulitis. Patient has some draining from the wound, wound looks red.  Possibility of MRSA.  Continue Rocephin, also continue vancomycin for now.  2.  Hypokalemia. Supplemented..  3.  Iron deficient anemia. Patient received 1 unit PRBC at time of admission.  She has a severe deficiency. We will give IV iron.  #4.  Permanent atrial fibrillation. Anticoagulation on hold due to wound bleeding.  Will restart in the next few days if bleeding stops.     DVT prophylaxis: Lovenox Code Status: Full Family Communication: None Disposition Plan:  .   Status is: Observation  The patient will require care spanning > 2 midnights and should be moved to inpatient because: Inpatient level of care appropriate due to severity of illness  Dispo: The patient is from: Home              Anticipated d/c is to: Home              Anticipated d/c date is: 2 days              Patient currently is not medically stable to d/c.        I/O last 3 completed shifts: In: 600 [Blood:400; IV Piggyback:200] Out: -  No intake/output data recorded.     Consultants:   None  Procedures: None  Antimicrobials:  Rocephin and vancomycin.  Subjective: Patient still has significant left leg edema and redness.  He denies any fever chills. She denies any abdominal pain nausea vomiting.  She has normal bowel  movements. No short of breath or cough.  Objective: Vitals:   01/01/20 0530 01/01/20 0600 01/01/20 0700 01/01/20 0800  BP: 125/63 (!) 139/56 (!) 128/59 (!) 128/59  Pulse: 83 86 78 78  Resp: 17 20 17 17   Temp:    98.1 F (36.7 C)  TempSrc:      SpO2: 95% 93% 96% 96%  Weight:      Height:        Intake/Output Summary (Last 24 hours) at 01/01/2020 1331 Last data filed at 01/01/2020 0419 Gross per 24 hour  Intake 600 ml  Output --  Net 600 ml   Filed Weights   12/31/19 1815  Weight: 56.7 kg    Examination:  General exam: Appears calm and comfortable  Respiratory system: Clear to auscultation. Respiratory effort normal. Cardiovascular system: Irregular. No JVD, murmurs, rubs, gallops or clicks. No pedal edema. Gastrointestinal system: Abdomen is nondistended, soft and nontender. No organomegaly or masses felt. Normal bowel sounds heard. Central nervous system: Alert and oriented x2. No focal neurological deficits. Extremities: Left leg ulcer, redness, tender to touch and warm to touch. Skin: No rashes, lesions or ulcers Psychiatry: Mood & affect appropriate.     Data Reviewed: I have personally reviewed following labs and imaging studies  CBC: Recent Labs  Lab 12/31/19 1817 01/01/20 0427  WBC 8.5 8.7  NEUTROABS 5.1  --  HGB 7.2* 8.3*  HCT 25.0* 27.8*  MCV 78.9* 80.1  PLT 365 540   Basic Metabolic Panel: Recent Labs  Lab 12/31/19 1817 01/01/20 0427  NA 139 140  K 3.3* 3.7  CL 105 106  CO2 23 23  GLUCOSE 168* 85  BUN 23 18  CREATININE 0.60 0.57  CALCIUM 8.8* 8.4*   GFR: Estimated Creatinine Clearance: 40.2 mL/min (by C-G formula based on SCr of 0.57 mg/dL). Liver Function Tests: Recent Labs  Lab 12/31/19 1817  AST 21  ALT 14  ALKPHOS 88  BILITOT 0.5  PROT 7.2  ALBUMIN 3.5   No results for input(s): LIPASE, AMYLASE in the last 168 hours. No results for input(s): AMMONIA in the last 168 hours. Coagulation Profile: No results for input(s):  INR, PROTIME in the last 168 hours. Cardiac Enzymes: No results for input(s): CKTOTAL, CKMB, CKMBINDEX, TROPONINI in the last 168 hours. BNP (last 3 results) No results for input(s): PROBNP in the last 8760 hours. HbA1C: No results for input(s): HGBA1C in the last 72 hours. CBG: No results for input(s): GLUCAP in the last 168 hours. Lipid Profile: No results for input(s): CHOL, HDL, LDLCALC, TRIG, CHOLHDL, LDLDIRECT in the last 72 hours. Thyroid Function Tests: No results for input(s): TSH, T4TOTAL, FREET4, T3FREE, THYROIDAB in the last 72 hours. Anemia Panel: Recent Labs    12/31/19 1955  VITAMINB12 942*  FOLATE 43.0  FERRITIN 5*  TIBC 455*  IRON 9*  RETICCTPCT 2.4   Sepsis Labs: Recent Labs  Lab 12/31/19 1817 12/31/19 2009  LATICACIDVEN 1.9 1.1    Recent Results (from the past 240 hour(s))  Resp Panel by RT-PCR (Flu A&B, Covid) Nasopharyngeal Swab     Status: None   Collection Time: 12/31/19  8:52 PM   Specimen: Nasopharyngeal Swab; Nasopharyngeal(NP) swabs in vial transport medium  Result Value Ref Range Status   SARS Coronavirus 2 by RT PCR NEGATIVE NEGATIVE Final    Comment: (NOTE) SARS-CoV-2 target nucleic acids are NOT DETECTED.  The SARS-CoV-2 RNA is generally detectable in upper respiratory specimens during the acute phase of infection. The lowest concentration of SARS-CoV-2 viral copies this assay can detect is 138 copies/mL. A negative result does not preclude SARS-Cov-2 infection and should not be used as the sole basis for treatment or other patient management decisions. A negative result may occur with  improper specimen collection/handling, submission of specimen other than nasopharyngeal swab, presence of viral mutation(s) within the areas targeted by this assay, and inadequate number of viral copies(<138 copies/mL). A negative result must be combined with clinical observations, patient history, and epidemiological information. The expected result  is Negative.  Fact Sheet for Patients:  EntrepreneurPulse.com.au  Fact Sheet for Healthcare Providers:  IncredibleEmployment.be  This test is no t yet approved or cleared by the [place] FDA and  has been authorized for detection and/or diagnosis of SARS-CoV-2 by FDA under an Emergency Use Authorization (EUA). This EUA will remain  in effect (meaning this test can be used) for the duration of the COVID-19 declaration under Section 564(b)(1) of the Act, 21 U.S.C.section 360bbb-3(b)(1), unless the authorization is terminated  or revoked sooner.       Influenza A by PCR NEGATIVE NEGATIVE Final   Influenza B by PCR NEGATIVE NEGATIVE Final    Comment: (NOTE) The Xpert Xpress SARS-CoV-2/FLU/RSV plus assay is intended as an aid in the diagnosis of influenza from Nasopharyngeal swab specimens and should not be used as a sole basis for treatment. Nasal washings  and aspirates are unacceptable for Xpert Xpress SARS-CoV-2/FLU/RSV testing.  Fact Sheet for Patients: EntrepreneurPulse.com.au  Fact Sheet for Healthcare Providers: IncredibleEmployment.be  This test is not yet approved or cleared by the [place] FDA and has been authorized for detection and/or diagnosis of SARS-CoV-2 by FDA under an Emergency Use Authorization (EUA). This EUA will remain in effect (meaning this test can be used) for the duration of the COVID-19 declaration under Section 564(b)(1) of the Act, 21 U.S.C. section 360bbb-3(b)(1), unless the authorization is terminated or revoked.  Performed at [facility], [address] [place], [place] [address]   Wound or Superficial Culture     Status: None (Preliminary result)   Collection Time: 12/31/19  9:07 PM   Specimen: Wound  Result Value Ref Range Status   Specimen Description   Final    WOUND Performed at [facility], [address], [place], [place]  [address]    Special Requests   Final    LEFT LEG Performed at [facility], [address] [place], [place] [address]    Gram Stain   Final    RARE WBC PRESENT, PREDOMINANTLY PMN RARE GRAM POSITIVE COCCI IN PAIRS Performed at [facility], [place] [address], [place],  [address]    Culture PENDING  Incomplete   Report Status PENDING  Incomplete         Radiology Studies: DG Tibia/Fibula Left  Result Date: 12/31/2019 CLINICAL DATA:  Left leg wound evaluate for osteomyelitis EXAM: LEFT TIBIA AND FIBULA - 2 VIEW COMPARISON:  None. FINDINGS: There is diffuse edematous swelling of the lower extremity. More focal irregularity and ulceration with superficial cutaneous lucency and overlying bandaging material is seen along the anterior aspect of the distal lower leg, superficial to the lower third tibial diaphysis. No other soft tissue gas or foreign body. No subjacent osseous erosion, destruction or periostitis changes are seen to suggest underlying osteomyelitis at this location or elsewhere within the lower extremity. Mild-to-moderate tricompartmental degenerative changes are noted in the knee with chondrocalcinosis of the medial and lateral menisci. Additional mild degenerative changes of the ankle. No sizable ankle effusion. IMPRESSION: 1. Soft tissue irregularity and ulceration along the anterior aspect of the distal lower leg, superficial to the lower third tibial diaphysis. No radiographic evidence of osteomyelitis. 2. Diffuse edematous swelling of the lower extremity. 3. Degenerative changes in the knee and ankle. 4. 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 Electronically Signed   By: Lovena Le M.D.   On: 12/31/2019 20:27   DG Chest Portable 1 View  Result Date: 12/31/2019 CLINICAL DATA:  86 year old female with weakness. EXAM: PORTABLE CHEST 1 VIEW COMPARISON:  Chest radiograph dated 07/26/2019. FINDINGS: There is  background of emphysema and chronic interstitial coarsening and bronchitic changes. No focal consolidation, pleural effusion or pneumothorax. The cardiac silhouette is within limits. Atherosclerotic calcification of the aorta. No acute osseous pathology. Osteopenia with degenerative changes of the spine. IMPRESSION: No active disease. Electronically Signed   By: Anner Crete M.D.   On: 12/31/2019 20:27        Scheduled Meds: . amiodarone  200 mg Oral Daily  . ascorbic acid  1,000 mg Oral Daily  . calcium carbonate  1,250 mg Oral Daily  . folic acid  5,681 mcg Oral Daily  . metoprolol tartrate  25 mg Oral BID  . multivitamin with minerals  1 tablet Oral Daily  . mupirocin ointment   Topical  Daily  . polyethylene glycol  17 g Oral Daily  . pravastatin  40 mg Oral q1800   Continuous Infusions: . sodium chloride    . cefTRIAXone (ROCEPHIN)  IV Stopped (01/01/20 0947)     LOS: 0 days    Time spent: 28 minutes    Sharen Hones, MD [facility]   To contact the attending provider between 7A-7P or the covering provider during after hours 7P-7A, please log into the web site [email or web address] and access using universal [place] password for that web site. If you do not have the password, please call the hospital operator.  01/01/2020, 1:31 PM

## 2020-01-02 DIAGNOSIS — L0390 Cellulitis, unspecified: Secondary | ICD-10-CM | POA: Diagnosis not present

## 2020-01-02 DIAGNOSIS — D500 Iron deficiency anemia secondary to blood loss (chronic): Secondary | ICD-10-CM

## 2020-01-02 DIAGNOSIS — I4821 Permanent atrial fibrillation: Secondary | ICD-10-CM | POA: Diagnosis not present

## 2020-01-02 DIAGNOSIS — E876 Hypokalemia: Secondary | ICD-10-CM | POA: Diagnosis not present

## 2020-01-02 LAB — BASIC METABOLIC PANEL
Anion gap: 8 (ref 5–15)
BUN: 17 mg/dL (ref 8–23)
CO2: 23 mmol/L (ref 22–32)
Calcium: 8.5 mg/dL — ABNORMAL LOW (ref 8.9–10.3)
Chloride: 105 mmol/L (ref 98–111)
Creatinine, Ser: 0.62 mg/dL (ref 0.44–1.00)
GFR, Estimated: >60 mL/min (ref >60)
Glucose, Bld: 107 mg/dL — ABNORMAL HIGH (ref 70–99)
Potassium: 3.7 mmol/L (ref 3.5–5.1)
Sodium: 136 mmol/L (ref 135–145)

## 2020-01-02 LAB — CBC WITH DIFFERENTIAL/PLATELET
Abs Immature Granulocytes: 0.04 10*3/uL (ref 0.00–0.07)
Basophils Absolute: 0.1 10*3/uL (ref 0.0–0.1)
Basophils Relative: 1 %
Eosinophils Absolute: 0.4 10*3/uL (ref 0.0–0.5)
Eosinophils Relative: 4 %
HCT: 27.8 % — ABNORMAL LOW (ref 36.0–46.0)
Hemoglobin: 8.5 g/dL — ABNORMAL LOW (ref 12.0–15.0)
Immature Granulocytes: 0 %
Lymphocytes Relative: 15 %
Lymphs Abs: 1.6 10*3/uL (ref 0.7–4.0)
MCH: 24 pg — ABNORMAL LOW (ref 26.0–34.0)
MCHC: 30.6 g/dL (ref 30.0–36.0)
MCV: 78.5 fL — ABNORMAL LOW (ref 80.0–100.0)
Monocytes Absolute: 1.8 10*3/uL — ABNORMAL HIGH (ref 0.1–1.0)
Monocytes Relative: 17 %
Neutro Abs: 6.7 10*3/uL (ref 1.7–7.7)
Neutrophils Relative %: 63 %
Platelets: 327 10*3/uL (ref 150–400)
RBC: 3.54 MIL/uL — ABNORMAL LOW (ref 3.87–5.11)
RDW: 17.7 % — ABNORMAL HIGH (ref 11.5–15.5)
WBC: 10.6 10*3/uL — ABNORMAL HIGH (ref 4.0–10.5)
nRBC: 0.2 % (ref 0.0–0.2)

## 2020-01-02 LAB — TYPE AND SCREEN
ABO/RH(D): A POS
Antibody Screen: NEGATIVE
Unit division: 0

## 2020-01-02 LAB — VITAMIN B12: Vitamin B-12: 1021 pg/mL — ABNORMAL HIGH (ref 180–914)

## 2020-01-02 LAB — BPAM RBC
Blood Product Expiration Date: 202112292359
ISSUE DATE / TIME: 202112030159
Unit Type and Rh: 6200

## 2020-01-02 LAB — MAGNESIUM: Magnesium: 2.2 mg/dL (ref 1.7–2.4)

## 2020-01-02 MED ORDER — SODIUM CHLORIDE 0.9 % IV SOLN
INTRAVENOUS | Status: DC | PRN
  Administered 2020-01-02: 250 mL via INTRAVENOUS

## 2020-01-02 MED ORDER — POLYSACCHARIDE IRON COMPLEX 150 MG PO CAPS
150.0000 mg | ORAL_CAPSULE | Freq: Every day | ORAL | Status: DC
  Administered 2020-01-02 – 2020-01-03 (×2): 150 mg via ORAL
  Filled 2020-01-02 (×3): qty 1

## 2020-01-02 MED ORDER — APIXABAN 2.5 MG PO TABS
2.5000 mg | ORAL_TABLET | Freq: Two times a day (BID) | ORAL | Status: DC
  Administered 2020-01-02 – 2020-01-03 (×3): 2.5 mg via ORAL
  Filled 2020-01-02 (×3): qty 1

## 2020-01-02 NOTE — Plan of Care (Signed)
  Problem: Health Behavior/Discharge Planning: Goal: Ability to manage health-related needs will improve Outcome: Progressing   Problem: Clinical Measurements: Goal: Ability to maintain clinical measurements within normal limits will improve Outcome: Progressing Goal: Will remain free from infection Outcome: Progressing Goal: Diagnostic test results will improve Outcome: Progressing   

## 2020-01-02 NOTE — Progress Notes (Addendum)
PROGRESS NOTE    [name] [name]  OVF:[id number] DOB: [birth year] DOA: 12/31/2019 PCP: [name], MD   Chief complaint. Left leg swelling and redness Brief Narrative:  [name] H Fairclothis a 86 y.o.femalewith medical history significant forparoxysmal atrial fibrillation, former tobacco user, hyperlipidemia, presents to ED for chief concern of left lower extremity leg wound that started about 5-7 days prior.  In the emergency room, he was given vancomycin and Zosyn, followed by ceftriaxone.   Assessment & Plan:   Active Problems:   HTN (hypertension)   Permanent atrial fibrillation (HCC)   Wound cellulitis   Hypokalemia   Left leg cellulitis   Iron deficiency anemia due to chronic blood loss  #1. Left leg wound with cellulitis. Condition much improved today. Patient will benefit for another day of IV antibiotics. Most likely will discharge home tomorrow. Continue Rocephin and vancomycin.  2. Hypokalemia. Potassium normalized.  3. Iron deficient anemia. Status post RBC and iron infusion. Continue to follow.  #4. Permanent atrial fibrillation. Resume anticoagulation. No longer has any bleeding from the leg.      DVT prophylaxis: Eliquis Code Status: Full Family Communication: Not able to reach his son over phone. Disposition Plan:  .   Status is: Inpatient  Remains inpatient appropriate because:Inpatient level of care appropriate due to severity of illness   Dispo: The patient is from: Home              Anticipated d/c is to: Home              Anticipated d/c date is: 1 day              Patient currently is not medically stable to d/c.        I/O last 3 completed shifts: In: 841 [P.O.:240; Blood:400; IV Piggyback:277] Out: -  No intake/output data recorded.     Consultants:   none  Procedures:  none  Antimicrobials: Vancomycin and Rocephin.  Subjective: Patient feels much better today. Left leg swelling and pain is improving.  No fever chills. No abdominal pain nausea vomiting. No diarrhea. No short of breath or cough. No dysuria hematuria.  Objective: Vitals:   01/01/20 1943 01/02/20 0003 01/02/20 0634 01/02/20 0801  BP: (!) 150/65 136/80 (!) 154/66 (!) 146/80  Pulse: 92 87 79 84  Resp: 16 18 17 18   Temp: 97.9 F (36.6 C) 98.3 F (36.8 C) 98.2 F (36.8 C) 98 F (36.7 C)  TempSrc:    Oral  SpO2: 96% 96% 95% 95%  Weight:      Height:        Intake/Output Summary (Last 24 hours) at 01/02/2020 0915 Last data filed at 01/01/2020 2200 Gross per 24 hour  Intake 317.01 ml  Output --  Net 317.01 ml   Filed Weights   12/31/19 1815  Weight: 56.7 kg    Examination:  General exam: Appears calm and comfortable  Respiratory system: Clear to auscultation. Respiratory effort normal. Cardiovascular system: Irregular no JVD, murmurs, rubs, gallops or clicks. No pedal edema. Gastrointestinal system: Abdomen is nondistended, soft and nontender. No organomegaly or masses felt. Normal bowel sounds heard. Central nervous system: Alert and oriented. No focal neurological deficits. Extremities: Left lower extremity redness and tenderness much better. No bleeding. Skin: No rashes, lesions or ulcers Psychiatry:  Mood & affect appropriate.     Data Reviewed: I have personally reviewed following labs and imaging studies  CBC: Recent Labs  Lab 12/31/19 1817 01/01/20 0427 01/02/20 0537  WBC 8.5 8.7 10.6*  NEUTROABS 5.1  --  6.7  HGB 7.2* 8.3* 8.5*  HCT 25.0* 27.8* 27.8*  MCV 78.9* 80.1 78.5*  PLT 365 294 329   Basic Metabolic Panel: Recent Labs  Lab 12/31/19 1817 01/01/20 0427 01/02/20 0537  NA 139 140 136  K 3.3* 3.7 3.7  CL 105 106 105  CO2 23 23 23   GLUCOSE 168* 85 107*  BUN 23 18 17   CREATININE 0.60 0.57 0.62  CALCIUM 8.8* 8.4* 8.5*  MG  --   --  2.2   GFR: Estimated Creatinine Clearance: 40.2 mL/min (by C-G formula based on SCr of 0.62 mg/dL). Liver Function Tests: Recent Labs  Lab  12/31/19 1817  AST 21  ALT 14  ALKPHOS 88  BILITOT 0.5  PROT 7.2  ALBUMIN 3.5   No results for input(s): LIPASE, AMYLASE in the last 168 hours. No results for input(s): AMMONIA in the last 168 hours. Coagulation Profile: No results for input(s): INR, PROTIME in the last 168 hours. Cardiac Enzymes: No results for input(s): CKTOTAL, CKMB, CKMBINDEX, TROPONINI in the last 168 hours. BNP (last 3 results) No results for input(s): PROBNP in the last 8760 hours. HbA1C: No results for input(s): HGBA1C in the last 72 hours. CBG: No results for input(s): GLUCAP in the last 168 hours. Lipid Profile: No results for input(s): CHOL, HDL, LDLCALC, TRIG, CHOLHDL, LDLDIRECT in the last 72 hours. Thyroid Function Tests: No results for input(s): TSH, T4TOTAL, FREET4, T3FREE, THYROIDAB in the last 72 hours. Anemia Panel: Recent Labs    12/31/19 1955 01/01/20 0427 01/01/20 1448  VITAMINB12 942*  --  1,021*  FOLATE 43.0  --   --   FERRITIN 5*  --   --   TIBC 455* 398  --   IRON 9* 23*  --   RETICCTPCT 2.4  --   --    Sepsis Labs: Recent Labs  Lab 12/31/19 1817 12/31/19 2009  LATICACIDVEN 1.9 1.1    Recent Results (from the past 240 hour(s))  Resp Panel by RT-PCR (Flu A&B, Covid) Nasopharyngeal Swab     Status: None   Collection Time: 12/31/19  8:52 PM   Specimen: Nasopharyngeal Swab; Nasopharyngeal(NP) swabs in vial transport medium  Result Value Ref Range Status   SARS Coronavirus 2 by RT PCR NEGATIVE NEGATIVE Final    Comment: (NOTE) SARS-CoV-2 target nucleic acids are NOT DETECTED.  The SARS-CoV-2 RNA is generally detectable in upper respiratory specimens during the acute phase of infection. The lowest concentration of SARS-CoV-2 viral copies this assay can detect is 138 copies/mL. A negative result does not preclude SARS-Cov-2 infection and should not be used as the sole basis for treatment or other patient management decisions. A negative result may occur with  improper  specimen collection/handling, submission of specimen other than nasopharyngeal swab, presence of viral mutation(s) within the areas targeted by this assay, and inadequate number of viral copies(<138 copies/mL). A negative result must be combined with clinical observations, patient history, and epidemiological information. The expected result is Negative.  Fact Sheet for Patients:  EntrepreneurPulse.com.au  Fact Sheet for Healthcare Providers:  IncredibleEmployment.be  This test is no t yet approved or cleared by the [place] FDA and  has been authorized for detection and/or diagnosis of SARS-CoV-2 by FDA under an Emergency Use Authorization (EUA). This EUA will remain  in effect (meaning this test can be used) for the duration of the COVID-19 declaration under Section 564(b)(1) of the Act, 21 U.S.C.section 360bbb-3(b)(1),  unless the authorization is terminated  or revoked sooner.       Influenza A by PCR NEGATIVE NEGATIVE Final   Influenza B by PCR NEGATIVE NEGATIVE Final    Comment: (NOTE) The Xpert Xpress SARS-CoV-2/FLU/RSV plus assay is intended as an aid in the diagnosis of influenza from Nasopharyngeal swab specimens and should not be used as a sole basis for treatment. Nasal washings and aspirates are unacceptable for Xpert Xpress SARS-CoV-2/FLU/RSV testing.  Fact Sheet for Patients: EntrepreneurPulse.com.au  Fact Sheet for Healthcare Providers: IncredibleEmployment.be  This test is not yet approved or cleared by the [place] FDA and has been authorized for detection and/or diagnosis of SARS-CoV-2 by FDA under an Emergency Use Authorization (EUA). This EUA will remain in effect (meaning this test can be used) for the duration of the COVID-19 declaration under Section 564(b)(1) of the Act, 21 U.S.C. section 360bbb-3(b)(1), unless the authorization is terminated or revoked.  Performed at  [facility], [address] [place], [place] [address]   Wound or Superficial Culture     Status: None (Preliminary result)   Collection Time: 12/31/19  9:07 PM   Specimen: Wound  Result Value Ref Range Status   Specimen Description   Final    WOUND Performed at [facility], [address], [place], [place] [address]    Special Requests   Final    LEFT LEG Performed at [facility], [address] [place], [place] [address]    Gram Stain   Final    RARE WBC PRESENT, PREDOMINANTLY PMN RARE GRAM POSITIVE COCCI IN PAIRS Performed at [facility], [place] [address], [place], [place] [address]    Culture PENDING  Incomplete   Report Status PENDING  Incomplete         Radiology Studies: DG Tibia/Fibula Left  Result Date: 12/31/2019 CLINICAL DATA:  Left leg wound evaluate for osteomyelitis EXAM: LEFT TIBIA AND FIBULA - 2 VIEW COMPARISON:  None. FINDINGS: There is diffuse edematous swelling of the lower extremity. More focal irregularity and ulceration with superficial cutaneous lucency and overlying bandaging material is seen along the anterior aspect of the distal lower leg, superficial to the lower third tibial diaphysis. No other soft tissue gas or foreign body. No subjacent osseous erosion, destruction or periostitis changes are seen to suggest underlying osteomyelitis at this location or elsewhere within the lower extremity. Mild-to-moderate tricompartmental degenerative changes are noted in the knee with chondrocalcinosis of the medial and lateral menisci. Additional mild degenerative changes of the ankle. No sizable ankle effusion. IMPRESSION: 1. Soft tissue irregularity and ulceration along the anterior aspect of the distal lower leg, superficial to the lower third tibial diaphysis. No radiographic evidence of osteomyelitis. 2. Diffuse edematous swelling of the lower extremity. 3. Degenerative changes in the knee and ankle. 4. Chondrocalcinosis of  the medial and lateral menisci could reflect early CPPD arthropathy. Electronically Signed   By: Lovena Le M.D.   On: 12/31/2019 20:27   DG Chest Portable 1 View  Result Date: 12/31/2019 CLINICAL DATA:  86 year old female with weakness. EXAM: PORTABLE CHEST 1 VIEW COMPARISON:  Chest radiograph dated 07/26/2019. FINDINGS: There is background of emphysema and chronic interstitial coarsening and bronchitic changes. No focal consolidation, pleural effusion or pneumothorax. The cardiac silhouette is within limits. Atherosclerotic calcification of the aorta. No acute osseous pathology. Osteopenia with degenerative changes of the spine. IMPRESSION: No active disease. Electronically Signed   By: Anner Crete M.D.   On: 12/31/2019 20:27  Scheduled Meds: . amiodarone  200 mg Oral Daily  . ascorbic acid  1,000 mg Oral Daily  . calcium carbonate  1,250 mg Oral Daily  . enoxaparin (LOVENOX) injection  40 mg Subcutaneous Q24H  . folic acid  6,384 mcg Oral Daily  . iron polysaccharides  150 mg Oral Daily  . metoprolol tartrate  25 mg Oral BID  . multivitamin with minerals  1 tablet Oral Daily  . mupirocin ointment   Topical Daily  . polyethylene glycol  17 g Oral Daily  . pravastatin  40 mg Oral q1800   Continuous Infusions: . sodium chloride    . cefTRIAXone (ROCEPHIN)  IV Stopped (01/01/20 0947)  . vancomycin 750 mg (01/01/20 2247)     LOS: 1 day    Time spent: 28 minutes    Sharen Hones, MD [facility]   To contact the attending provider between 7A-7P or the covering provider during after hours 7P-7A, please log into the web site [email or web address] and access using universal [place] password for that web site. If you do not have the password, please call the hospital operator.  01/02/2020, 9:15 AM

## 2020-01-03 DIAGNOSIS — D500 Iron deficiency anemia secondary to blood loss (chronic): Secondary | ICD-10-CM | POA: Diagnosis not present

## 2020-01-03 DIAGNOSIS — I4821 Permanent atrial fibrillation: Secondary | ICD-10-CM | POA: Diagnosis not present

## 2020-01-03 DIAGNOSIS — L0390 Cellulitis, unspecified: Secondary | ICD-10-CM | POA: Diagnosis not present

## 2020-01-03 LAB — BASIC METABOLIC PANEL
Anion gap: 10 (ref 5–15)
BUN: 17 mg/dL (ref 8–23)
CO2: 21 mmol/L — ABNORMAL LOW (ref 22–32)
Calcium: 8.4 mg/dL — ABNORMAL LOW (ref 8.9–10.3)
Chloride: 106 mmol/L (ref 98–111)
Creatinine, Ser: 0.57 mg/dL (ref 0.44–1.00)
GFR, Estimated: >60 mL/min (ref >60)
Glucose, Bld: 109 mg/dL — ABNORMAL HIGH (ref 70–99)
Potassium: 3.3 mmol/L — ABNORMAL LOW (ref 3.5–5.1)
Sodium: 137 mmol/L (ref 135–145)

## 2020-01-03 LAB — CBC WITH DIFFERENTIAL/PLATELET
Abs Immature Granulocytes: 0.09 10*3/uL — ABNORMAL HIGH (ref 0.00–0.07)
Basophils Absolute: 0.1 10*3/uL (ref 0.0–0.1)
Basophils Relative: 1 %
Eosinophils Absolute: 0.4 10*3/uL (ref 0.0–0.5)
Eosinophils Relative: 3 %
HCT: 28.2 % — ABNORMAL LOW (ref 36.0–46.0)
Hemoglobin: 8.3 g/dL — ABNORMAL LOW (ref 12.0–15.0)
Immature Granulocytes: 1 %
Lymphocytes Relative: 15 %
Lymphs Abs: 1.9 10*3/uL (ref 0.7–4.0)
MCH: 23.7 pg — ABNORMAL LOW (ref 26.0–34.0)
MCHC: 29.4 g/dL — ABNORMAL LOW (ref 30.0–36.0)
MCV: 80.6 fL (ref 80.0–100.0)
Monocytes Absolute: 2.2 10*3/uL — ABNORMAL HIGH (ref 0.1–1.0)
Monocytes Relative: 18 %
Neutro Abs: 7.6 10*3/uL (ref 1.7–7.7)
Neutrophils Relative %: 62 %
Platelets: 323 10*3/uL (ref 150–400)
RBC: 3.5 MIL/uL — ABNORMAL LOW (ref 3.87–5.11)
RDW: 18.6 % — ABNORMAL HIGH (ref 11.5–15.5)
WBC: 12.2 10*3/uL — ABNORMAL HIGH (ref 4.0–10.5)
nRBC: 0.3 % — ABNORMAL HIGH (ref 0.0–0.2)

## 2020-01-03 LAB — AEROBIC CULTURE W GRAM STAIN (SUPERFICIAL SPECIMEN)

## 2020-01-03 LAB — MAGNESIUM: Magnesium: 2.1 mg/dL (ref 1.7–2.4)

## 2020-01-03 MED ORDER — POLYSACCHARIDE IRON COMPLEX 150 MG PO CAPS
150.0000 mg | ORAL_CAPSULE | Freq: Every day | ORAL | 0 refills | Status: DC
Start: 2020-01-03 — End: 2020-08-14

## 2020-01-03 MED ORDER — AMOXICILLIN-POT CLAVULANATE 875-125 MG PO TABS: 1.0000 | ORAL_TABLET | Freq: Two times a day (BID) | ORAL | 0 refills | Status: AC

## 2020-01-03 MED ORDER — POTASSIUM CHLORIDE 20 MEQ PO PACK
40.0000 meq | PACK | ORAL | Status: DC
  Administered 2020-01-03: 40 meq via ORAL
  Filled 2020-01-03: qty 2

## 2020-01-03 MED ORDER — DOXYCYCLINE MONOHYDRATE 100 MG PO TABS: 100.0000 mg | ORAL_TABLET | Freq: Two times a day (BID) | ORAL | 0 refills | Status: AC

## 2020-01-03 NOTE — Discharge Summary (Signed)
Physician Discharge Summary  Patient ID: [name] [name] MRN: [id number] DOB/AGE: 1929-08-19 86 y.o.  Admit date: 12/31/2019 Discharge date: 01/03/2020  Admission Diagnoses:  Discharge Diagnoses:  Active Problems:   HTN (hypertension)   Permanent atrial fibrillation (HCC)   Wound cellulitis   Hypokalemia   Left leg cellulitis   Iron deficiency anemia due to chronic blood loss   Discharged Condition: good  Hospital Course:  [name] H Fairclothis a 86 y.o.femalewith medical history significant forparoxysmal atrial fibrillation, former tobacco user, hyperlipidemia, presents to ED for chief concern of left lower extremity leg wound that started about 5-7 days prior.In the emergency room, he was given vancomycin and Zosyn, followed by ceftriaxone.  #1. Left leg wound with cellulitis. Condition much improved.  Patient is treated with vancomycin and Rocephin.   Continue 7 days antibiotics with Augmentin and doxycycline. Patient wished to go home, will set up home care and physical therapy.  Wound to be also followed by visiting nurse.   2. Hypokalemia. Potassium normalized.  3. Iron deficient anemia. Status post RBC and iron infusion. No active bleeding.  Continue oral supplement  #4. Permanent atrial fibrillation. Resumed anticoagulation. No longer has any bleeding from the leg.      Consults: None  Significant Diagnostic Studies:  LEFT TIBIA AND FIBULA - 2 VIEW  COMPARISON:  None.  FINDINGS: There is diffuse edematous swelling of the lower extremity. More focal irregularity and ulceration with superficial cutaneous lucency and overlying bandaging material is seen along the anterior aspect of the distal lower leg, superficial to the lower third tibial diaphysis. No other soft tissue gas or foreign body.  No subjacent osseous erosion, destruction or periostitis changes are seen to suggest underlying osteomyelitis at this location or elsewhere within  the lower extremity. Mild-to-moderate tricompartmental degenerative changes are noted in the knee with chondrocalcinosis of the medial and lateral menisci. Additional mild degenerative changes of the ankle. No sizable ankle effusion.  IMPRESSION: 1. Soft tissue irregularity and ulceration along the anterior aspect of the distal lower leg, superficial to the lower third tibial diaphysis. No radiographic evidence of osteomyelitis. 2. Diffuse edematous swelling of the lower extremity. 3. Degenerative changes in the knee and ankle. 4. 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   Electronically Signed   By: Lovena Le M.D.   On: 12/31/2019 20:27   Treatments: Rocephin and vancomycin.  Discharge Exam: Blood pressure (!) 152/135, pulse 81, temperature 98.1 F (36.7 C), resp. rate 16, height 5\' 3"  (1.6 m), weight 56.7 kg, SpO2 95 %. General appearance: alert and cooperative Resp: clear to auscultation bilaterally Cardio: Irregular, rate under control.  No murmurs GI: soft, non-tender; bowel sounds normal; no masses,  no organomegaly Extremities: Left leg ulcer, with some draining.  Leg redness much improved.  Nontender.  Disposition: Discharge disposition: 01-Home or Self Care       Discharge Instructions    Diet - low sodium heart healthy   Complete by: As directed    Discharge wound care:   Complete by: As directed    Followed by visiting nurse, instruction as above.   Increase activity slowly   Complete by: As directed      Allergies as of 01/03/2020      Reactions   Bactrim [sulfamethoxazole-trimethoprim] Other (See Comments)   Hearing loss   Ciprofloxacin Other (See Comments)   Flu-like symptoms   Pneumococcal Vaccines Other (See Comments)   Flu-like symptoms  Medication List    STOP taking these medications   verapamil 180 MG CR tablet Commonly known as: CALAN-SR     TAKE these medications    amiodarone 200 MG tablet Commonly known as: PACERONE Take 1 tablet (200 mg total) by mouth daily.   amoxicillin-clavulanate 875-125 MG tablet Commonly known as: Augmentin Take 1 tablet by mouth 2 (two) times daily for 7 days.   apixaban 2.5 MG Tabs tablet Commonly known as: ELIQUIS Take 1 tablet (2.5 mg total) by mouth 2 (two) times daily.   ascorbic acid 1000 MG tablet Commonly known as: VITAMIN C Take 1,000 mg by mouth daily.   aspirin 81 MG EC tablet Take 1 tablet (81 mg total) by mouth daily. Swallow whole.   calcium carbonate 1250 MG capsule Take 1,250 mg by mouth 2 (two) times daily with a meal.   Cholecalciferol 25 MCG (1000 UT) tablet Take 1 tablet by mouth in the morning and at bedtime.   doxycycline 100 MG tablet Commonly known as: ADOXA Take 1 tablet (100 mg total) by mouth 2 (two) times daily for 7 days.   fluticasone 50 MCG/ACT nasal spray Commonly known as: FLONASE Place 2 sprays into both nostrils daily as needed for rhinitis.   folic acid 272 MCG tablet Commonly known as: FOLVITE Take 800 mcg by mouth daily.   GLUCOSAMINE PO Take 2 Doses by mouth at bedtime.   hydrochlorothiazide 25 MG tablet Commonly known as: HYDRODIURIL Take 25 mg by mouth daily.   iron polysaccharides 150 MG capsule Commonly known as: NIFEREX Take 1 capsule (150 mg total) by mouth daily.   lidocaine 5 % Commonly known as: Lidoderm Place 1 patch onto the skin every 12 (twelve) hours. Remove & Discard patch within 12 hours or as directed by MD   lovastatin 40 MG tablet Commonly known as: MEVACOR Take 40 mg by mouth at bedtime.   metoprolol tartrate 25 MG tablet Commonly known as: LOPRESSOR Take 1 tablet (25 mg total) by mouth 2 (two) times daily.   multivitamin tablet Take 1 tablet by mouth daily.   oxyCODONE-acetaminophen 5-325 MG tablet Commonly known as: PERCOCET/ROXICET Take 1 tablet by mouth every 6 (six) hours as needed for moderate pain or severe pain.    polyethylene glycol 17 g packet Commonly known as: MIRALAX / GLYCOLAX Take 17 g by mouth at bedtime. Takes 1 packet in the morning also if needed   tretinoin 0.05 % cream Commonly known as: RETIN-A Apply 1 Dose topically at bedtime.            Discharge Care Instructions  (From admission, onward)         Start     Ordered   01/03/20 0000  Discharge wound care:       Comments: Followed by visiting nurse, instruction as above.   01/03/20 0944          Follow-up Information    [name], MD Follow up in 1 week(s).   Specialty: Family Medicine Contact information: [place] [address] [phone]               Signed: Sharen Hones 01/03/2020, 9:44 AM

## 2020-01-03 NOTE — Plan of Care (Signed)

## 2020-01-11 ENCOUNTER — Emergency Department: Payer: Medicare Other

## 2020-01-11 ENCOUNTER — Other Ambulatory Visit: Payer: Self-pay

## 2020-01-11 ENCOUNTER — Inpatient Hospital Stay
Admission: EM | Admit: 2020-01-11 | Discharge: 2020-01-16 | DRG: 308 | Disposition: A | Discharge status: Home Health | Payer: Medicare Other | Attending: Internal Medicine | Admitting: Internal Medicine

## 2020-01-11 DIAGNOSIS — Z887 Allergy status to serum and vaccine status: Secondary | ICD-10-CM | POA: Diagnosis not present

## 2020-01-11 DIAGNOSIS — L03115 Cellulitis of right lower limb: Secondary | ICD-10-CM | POA: Diagnosis present

## 2020-01-11 DIAGNOSIS — Z9119 Patient's noncompliance with other medical treatment and regimen: Secondary | ICD-10-CM

## 2020-01-11 DIAGNOSIS — D509 Iron deficiency anemia, unspecified: Secondary | ICD-10-CM

## 2020-01-11 DIAGNOSIS — F419 Anxiety disorder, unspecified: Secondary | ICD-10-CM | POA: Diagnosis present

## 2020-01-11 DIAGNOSIS — E8770 Fluid overload, unspecified: Secondary | ICD-10-CM | POA: Diagnosis present

## 2020-01-11 DIAGNOSIS — I83028 Varicose veins of left lower extremity with ulcer other part of lower leg: Secondary | ICD-10-CM | POA: Diagnosis present

## 2020-01-11 DIAGNOSIS — I4821 Permanent atrial fibrillation: Secondary | ICD-10-CM | POA: Diagnosis present

## 2020-01-11 DIAGNOSIS — J9601 Acute respiratory failure with hypoxia: Secondary | ICD-10-CM | POA: Diagnosis not present

## 2020-01-11 DIAGNOSIS — Z882 Allergy status to sulfonamides status: Secondary | ICD-10-CM | POA: Diagnosis not present

## 2020-01-11 DIAGNOSIS — E876 Hypokalemia: Secondary | ICD-10-CM | POA: Diagnosis present

## 2020-01-11 DIAGNOSIS — L03116 Cellulitis of left lower limb: Secondary | ICD-10-CM | POA: Diagnosis present

## 2020-01-11 DIAGNOSIS — Z87891 Personal history of nicotine dependence: Secondary | ICD-10-CM

## 2020-01-11 DIAGNOSIS — J9811 Atelectasis: Secondary | ICD-10-CM | POA: Diagnosis present

## 2020-01-11 DIAGNOSIS — Z7901 Long term (current) use of anticoagulants: Secondary | ICD-10-CM

## 2020-01-11 DIAGNOSIS — M8580 Other specified disorders of bone density and structure, unspecified site: Secondary | ICD-10-CM | POA: Diagnosis present

## 2020-01-11 DIAGNOSIS — I4891 Unspecified atrial fibrillation: Secondary | ICD-10-CM | POA: Diagnosis present

## 2020-01-11 DIAGNOSIS — Z20822 Contact with and (suspected) exposure to covid-19: Secondary | ICD-10-CM | POA: Diagnosis present

## 2020-01-11 DIAGNOSIS — I10 Essential (primary) hypertension: Secondary | ICD-10-CM | POA: Diagnosis present

## 2020-01-11 DIAGNOSIS — K648 Other hemorrhoids: Secondary | ICD-10-CM | POA: Diagnosis present

## 2020-01-11 DIAGNOSIS — Z79899 Other long term (current) drug therapy: Secondary | ICD-10-CM

## 2020-01-11 DIAGNOSIS — K5909 Other constipation: Secondary | ICD-10-CM | POA: Diagnosis present

## 2020-01-11 DIAGNOSIS — H9192 Unspecified hearing loss, left ear: Secondary | ICD-10-CM | POA: Diagnosis present

## 2020-01-11 DIAGNOSIS — R0902 Hypoxemia: Secondary | ICD-10-CM

## 2020-01-11 DIAGNOSIS — E7800 Pure hypercholesterolemia, unspecified: Secondary | ICD-10-CM | POA: Diagnosis present

## 2020-01-11 DIAGNOSIS — D500 Iron deficiency anemia secondary to blood loss (chronic): Secondary | ICD-10-CM | POA: Diagnosis present

## 2020-01-11 DIAGNOSIS — T502X5A Adverse effect of carbonic-anhydrase inhibitors, benzothiadiazides and other diuretics, initial encounter: Secondary | ICD-10-CM | POA: Diagnosis present

## 2020-01-11 DIAGNOSIS — L97929 Non-pressure chronic ulcer of unspecified part of left lower leg with unspecified severity: Secondary | ICD-10-CM

## 2020-01-11 LAB — CBC WITH DIFFERENTIAL/PLATELET
Abs Immature Granulocytes: 0.04 10*3/uL (ref 0.00–0.07)
Basophils Absolute: 0.1 10*3/uL (ref 0.0–0.1)
Basophils Relative: 1 %
Eosinophils Absolute: 0.2 10*3/uL (ref 0.0–0.5)
Eosinophils Relative: 2 %
HCT: 36.4 % (ref 36.0–46.0)
Hemoglobin: 10.6 g/dL — ABNORMAL LOW (ref 12.0–15.0)
Immature Granulocytes: 0 %
Lymphocytes Relative: 18 %
Lymphs Abs: 1.7 10*3/uL (ref 0.7–4.0)
MCH: 24.7 pg — ABNORMAL LOW (ref 26.0–34.0)
MCHC: 29.1 g/dL — ABNORMAL LOW (ref 30.0–36.0)
MCV: 84.7 fL (ref 80.0–100.0)
Monocytes Absolute: 1.3 10*3/uL — ABNORMAL HIGH (ref 0.1–1.0)
Monocytes Relative: 14 %
Neutro Abs: 6.2 10*3/uL (ref 1.7–7.7)
Neutrophils Relative %: 65 %
Platelets: 314 10*3/uL (ref 150–400)
RBC: 4.3 MIL/uL (ref 3.87–5.11)
RDW: 23.5 % — ABNORMAL HIGH (ref 11.5–15.5)
Smear Review: NORMAL
WBC: 9.6 10*3/uL (ref 4.0–10.5)
nRBC: 0 % (ref 0.0–0.2)

## 2020-01-11 LAB — COMPREHENSIVE METABOLIC PANEL
ALT: 19 U/L (ref 0–44)
AST: 24 U/L (ref 15–41)
Albumin: 3.6 g/dL (ref 3.5–5.0)
Alkaline Phosphatase: 80 U/L (ref 38–126)
Anion gap: 9 (ref 5–15)
BUN: 26 mg/dL — ABNORMAL HIGH (ref 8–23)
CO2: 26 mmol/L (ref 22–32)
Calcium: 9.6 mg/dL (ref 8.9–10.3)
Chloride: 109 mmol/L (ref 98–111)
Creatinine, Ser: 0.76 mg/dL (ref 0.44–1.00)
GFR, Estimated: >60 mL/min (ref >60)
Glucose, Bld: 111 mg/dL — ABNORMAL HIGH (ref 70–99)
Potassium: 4 mmol/L (ref 3.5–5.1)
Sodium: 144 mmol/L (ref 135–145)
Total Bilirubin: 0.7 mg/dL (ref 0.3–1.2)
Total Protein: 7.2 g/dL (ref 6.5–8.1)

## 2020-01-11 LAB — RESP PANEL BY RT-PCR (FLU A&B, COVID) ARPGX2
Influenza A by PCR: NEGATIVE
Influenza B by PCR: NEGATIVE
SARS Coronavirus 2 by RT PCR: NEGATIVE

## 2020-01-11 LAB — URINALYSIS, COMPLETE (UACMP) WITH MICROSCOPIC
Bacteria, UA: NONE SEEN
Bilirubin Urine: NEGATIVE
Glucose, UA: NEGATIVE mg/dL
Hgb urine dipstick: NEGATIVE
Ketones, ur: NEGATIVE mg/dL
Leukocytes,Ua: NEGATIVE
Nitrite: NEGATIVE
Protein, ur: NEGATIVE mg/dL
Specific Gravity, Urine: 1.017 (ref 1.005–1.030)
Squamous Epithelial / HPF: NONE SEEN (ref 0–5)
pH: 5 (ref 5.0–8.0)

## 2020-01-11 LAB — LACTIC ACID, PLASMA: Lactic Acid, Venous: 1.1 mmol/L (ref 0.5–1.9)

## 2020-01-11 MED ORDER — DILTIAZEM LOAD VIA INFUSION
10.0000 mg | Freq: Once | INTRAVENOUS | Status: AC
  Administered 2020-01-11: 10 mg via INTRAVENOUS
  Filled 2020-01-11: qty 10

## 2020-01-11 MED ORDER — ACETAMINOPHEN 325 MG PO TABS
650.0000 mg | ORAL_TABLET | ORAL | Status: DC | PRN
  Administered 2020-01-12 (×2): 650 mg via ORAL
  Filled 2020-01-11 (×2): qty 2

## 2020-01-11 MED ORDER — SODIUM CHLORIDE 0.9 % IV SOLN: INTRAVENOUS | Status: AC

## 2020-01-11 MED ORDER — METOPROLOL TARTRATE 25 MG PO TABS
25.0000 mg | ORAL_TABLET | Freq: Two times a day (BID) | ORAL | Status: DC
  Administered 2020-01-12 – 2020-01-13 (×3): 25 mg via ORAL
  Filled 2020-01-11 (×3): qty 1

## 2020-01-11 MED ORDER — METOPROLOL TARTRATE 25 MG PO TABS
25.0000 mg | ORAL_TABLET | Freq: Once | ORAL | Status: AC
  Administered 2020-01-11: 25 mg via ORAL
  Filled 2020-01-11: qty 1

## 2020-01-11 MED ORDER — PRAVASTATIN SODIUM 40 MG PO TABS
40.0000 mg | ORAL_TABLET | Freq: Every day | ORAL | Status: DC
  Administered 2020-01-12 – 2020-01-15 (×4): 40 mg via ORAL
  Filled 2020-01-11 (×4): qty 1

## 2020-01-11 MED ORDER — POLYSACCHARIDE IRON COMPLEX 150 MG PO CAPS
150.0000 mg | ORAL_CAPSULE | Freq: Every day | ORAL | Status: DC
  Administered 2020-01-12 – 2020-01-16 (×5): 150 mg via ORAL
  Filled 2020-01-11 (×5): qty 1

## 2020-01-11 MED ORDER — ZOLPIDEM TARTRATE 5 MG PO TABS
5.0000 mg | ORAL_TABLET | Freq: Every evening | ORAL | Status: DC | PRN
  Administered 2020-01-12 – 2020-01-14 (×3): 5 mg via ORAL
  Filled 2020-01-11 (×3): qty 1

## 2020-01-11 MED ORDER — HYDROCHLOROTHIAZIDE 25 MG PO TABS: 25.0000 mg | ORAL_TABLET | Freq: Every day | ORAL | Status: DC

## 2020-01-11 MED ORDER — APIXABAN 2.5 MG PO TABS
2.5000 mg | ORAL_TABLET | Freq: Two times a day (BID) | ORAL | Status: DC
  Filled 2020-01-11 (×2): qty 1

## 2020-01-11 MED ORDER — ONDANSETRON HCL 4 MG/2ML IJ SOLN
4.0000 mg | Freq: Four times a day (QID) | INTRAMUSCULAR | Status: DC | PRN
  Administered 2020-01-15: 4 mg via INTRAVENOUS
  Filled 2020-01-11: qty 2

## 2020-01-11 MED ORDER — AMIODARONE HCL 200 MG PO TABS: 200.0000 mg | ORAL_TABLET | Freq: Every day | ORAL | Status: DC

## 2020-01-11 MED ORDER — METOPROLOL TARTRATE 5 MG/5ML IV SOLN
5.0000 mg | Freq: Once | INTRAVENOUS | Status: AC
  Administered 2020-01-11: 5 mg via INTRAVENOUS
  Filled 2020-01-11: qty 5

## 2020-01-11 MED ORDER — AMIODARONE HCL 200 MG PO TABS
200.0000 mg | ORAL_TABLET | Freq: Every day | ORAL | Status: DC
  Administered 2020-01-11 – 2020-01-16 (×6): 200 mg via ORAL
  Filled 2020-01-11 (×6): qty 1

## 2020-01-11 MED ORDER — FUROSEMIDE 10 MG/ML IJ SOLN
40.0000 mg | Freq: Once | INTRAMUSCULAR | Status: AC
  Administered 2020-01-11: 40 mg via INTRAVENOUS
  Filled 2020-01-11: qty 4

## 2020-01-11 MED ORDER — DILTIAZEM HCL-DEXTROSE 125-5 MG/125ML-% IV SOLN (PREMIX)
5.0000 mg/h | INTRAVENOUS | Status: DC
  Administered 2020-01-11: 10 mg/h via INTRAVENOUS
  Filled 2020-01-11 (×2): qty 125

## 2020-01-11 MED ORDER — DILTIAZEM HCL-DEXTROSE 125-5 MG/125ML-% IV SOLN (PREMIX)
5.0000 mg/h | INTRAVENOUS | Status: DC
  Administered 2020-01-11: 5 mg/h via INTRAVENOUS
  Filled 2020-01-11: qty 125

## 2020-01-11 NOTE — ED Notes (Signed)
Provider doesn't need another ekg

## 2020-01-11 NOTE — ED Triage Notes (Signed)
First Nurse Note:  Arrives to be evaluated for left leg wound.  Recent hospitalization for same. Discharged home on antibiotics.  Patient states she was expecting home health to follow up with her at home, but states no one has been over to follow up with wound care and dressing changes.  AAOx3.  Skin warm and dry. NAD

## 2020-01-11 NOTE — ED Notes (Signed)
hospitalist at bedside

## 2020-01-11 NOTE — ED Notes (Signed)
ED Provider at bedside. 

## 2020-01-11 NOTE — ED Provider Notes (Signed)
[facility] Emergency Department Provider Note    Event Date/Time   First MD Initiated Contact with Patient 01/11/20 1705     (approximate)  I have reviewed the triage vital signs and the nursing notes.   HISTORY  Chief Complaint Leg Swelling    HPI [name] [name] is a 86 y.o. female presents to the ER  for evaluation of persistent left leg wound and swelling.  Patient lost her discharge paperwork and is a bit confused about what medication she supposed to be on.  She denies any chest pain.  She has not been taking her blood thinner since discharge because she is afraid of bleeding from the wound.  Denies any numbness or tingling.  She denies any shortness of breath at this time.   Past Medical History:  Diagnosis Date  . Anxiety   . Cancer ([place])    Squamous Cell Carcinoma (top of head)  . Constipation, chronic   . Deaf, left   . Diverticular disease   . Hearing aid worn   . Hearing deficit   . History of Bell's palsy    left side - resolved - 50 yrs ago  . Hypertension   . Internal hemorrhoids   . Osteopenia   . Pure hypercholesterolemia    No family history on file. Past Surgical History:  Procedure Laterality Date  . CATARACT EXTRACTION W/PHACO Left 11/16/2015   Procedure: CATARACT EXTRACTION PHACO AND INTRAOCULAR LENS PLACEMENT (IOC);  Surgeon: Leandrew Koyanagi, MD;  Location: [place];  Service: Ophthalmology;  Laterality: Left;  TORIC  . CATARACT EXTRACTION W/PHACO Right 12/07/2015   Procedure: CATARACT EXTRACTION PHACO AND INTRAOCULAR LENS PLACEMENT (IOC);  Surgeon: Leandrew Koyanagi, MD;  Location: [place];  Service: Ophthalmology;  Laterality: Right;  TORIC  . COLONOSCOPY WITH PROPOFOL N/A 08/10/2014   Procedure: COLONOSCOPY WITH PROPOFOL;  Surgeon: Lollie Sails, MD;  Location: [facility];  Service: Endoscopy;  Laterality: N/A;  . FEMUR FRACTURE SURGERY    . Nasal Polyp Removal     Patient  Active Problem List   Diagnosis Date Noted  . Rapid atrial fibrillation ([place]) 01/11/2020  . Chronic anticoagulation 01/11/2020  . Chronic ulcer of left leg ([place]) 01/11/2020  . Iron deficiency anemia 01/11/2020  . Hypokalemia 01/01/2020  . Left leg cellulitis 01/01/2020  . Iron deficiency anemia due to chronic blood loss 01/01/2020  . Wound cellulitis 12/31/2019  . Pelvic fracture ([place]) 08/25/2019  . Permanent atrial fibrillation ([place]) 08/25/2019  . Pain   . Dehydration   . Accidental fall 07/26/2019  . UTI (urinary tract infection) 07/26/2019  . Closed fracture of right inferior pubic ramus ([place]) 07/26/2019  . HTN (hypertension) 07/26/2019  . Ambulatory dysfunction 07/26/2019  . Pubic bone fracture ([place]) 07/26/2019      Prior to Admission medications   Medication Sig Start Date End Date Taking? Authorizing Provider  amiodarone (PACERONE) 200 MG tablet Take 1 tablet (200 mg total) by mouth daily. Patient not taking: Reported on 12/31/2019 08/29/19   Sharen Hones, MD  apixaban (ELIQUIS) 2.5 MG TABS tablet Take 1 tablet (2.5 mg total) by mouth 2 (two) times daily. Patient not taking: Reported on 12/31/2019 08/28/19   Sharen Hones, MD  ascorbic acid (VITAMIN C) 1000 MG tablet Take 1,000 mg by mouth daily.    [provider]  aspirin EC 81 MG EC tablet Take 1 tablet (81 mg total) by mouth daily. Swallow whole. Patient not taking: Reported on 12/31/2019 08/29/19  Sharen Hones, MD  calcium carbonate 1250 MG capsule Take 1,250 mg by mouth 2 (two) times daily with a meal.     [provider]  Cholecalciferol 25 MCG (1000 UT) tablet Take 1 tablet by mouth in the morning and at bedtime.    [provider]  fluticasone (FLONASE) 50 MCG/ACT nasal spray Place 2 sprays into both nostrils daily as needed for rhinitis.    [provider]  folic acid (FOLVITE) 998 MCG tablet Take 800 mcg by mouth daily.    [provider]  Glucosamine HCl (GLUCOSAMINE PO)  Take 2 Doses by mouth at bedtime.     [provider]  hydrochlorothiazide (HYDRODIURIL) 25 MG tablet Take 25 mg by mouth daily. 12/28/19   [provider]  iron polysaccharides (NIFEREX) 150 MG capsule Take 1 capsule (150 mg total) by mouth daily. 01/03/20   Sharen Hones, MD  lidocaine (LIDODERM) 5 % Place 1 patch onto the skin every 12 (twelve) hours. Remove & Discard patch within 12 hours or as directed by MD Patient not taking: Reported on 08/26/2019 07/23/19 07/22/20  Sable Feil, PA-C  lovastatin (MEVACOR) 40 MG tablet Take 40 mg by mouth at bedtime.    [provider]  metoprolol tartrate (LOPRESSOR) 25 MG tablet Take 1 tablet (25 mg total) by mouth 2 (two) times daily. Patient not taking: Reported on 12/31/2019 08/28/19   Sharen Hones, MD  Multiple Vitamin (MULTIVITAMIN) tablet Take 1 tablet by mouth daily.    [provider]  oxyCODONE-acetaminophen (PERCOCET/ROXICET) 5-325 MG tablet Take 1 tablet by mouth every 6 (six) hours as needed for moderate pain or severe pain. Patient not taking: Reported on 12/31/2019 08/28/19   Sharen Hones, MD  polyethylene glycol (MIRALAX / GLYCOLAX) 17 g packet Take 17 g by mouth at bedtime. Takes 1 packet in the morning also if needed    [provider]  tretinoin (RETIN-A) 0.05 % cream Apply 1 Dose topically at bedtime.    [provider]    Allergies Bactrim [sulfamethoxazole-trimethoprim], Ciprofloxacin, and Pneumococcal vaccines    Social History Social History   Tobacco Use  . Smoking status: Former Smoker    Quit date: 10/30/1985    Years since quitting: 34.2  . Smokeless tobacco: Never Used  Substance Use Topics  . Alcohol use: No  . Drug use: No    Review of Systems Patient denies headaches, rhinorrhea, blurry vision, numbness, shortness of breath, chest pain, edema, cough, abdominal pain, nausea, vomiting, diarrhea, dysuria, fevers, rashes or hallucinations unless otherwise stated  above in HPI. ____________________________________________   PHYSICAL EXAM:  VITAL SIGNS: Vitals:   01/11/20 2315 01/11/20 2316  BP: (!) 111/52   Pulse:  98  Resp:  (!) 22  Temp:    SpO2:  92%    Constitutional: Alert and oriented.  Eyes: Conjunctivae are normal.  Head: Atraumatic. Nose: No congestion/rhinnorhea. Mouth/Throat: Mucous membranes are moist.   Neck: No stridor. Painless ROM.  Cardiovascular: rapid irregularly irregular rhythm. Grossly normal heart sounds.  Good peripheral circulation. Respiratory: Normal respiratory effort.  No retractions. Lungs CTAB. Gastrointestinal: Soft and nontender. No distention. No abdominal bruits. No CVA tenderness. Genitourinary:  Musculoskeletal: Left greater than right pitting edema there is evidence of wound with serous drainage of the anterior left leg.  Strong Doppler signal to the PT and DP .no joint effusions. Neurologic:  Normal speech and language. No gross focal neurologic deficits are appreciated. No facial droop Skin:  Skin is warm,  dry and intact. No rash noted. Psychiatric: Mood and affect are normal. Speech and behavior are normal.  ____________________________________________   LABS (all labs ordered are listed, but only abnormal results are displayed)  Results for orders placed or performed during the hospital encounter of 01/11/20 (from the past 24 hour(s))  Lactic acid, plasma     Status: None   Collection Time: 01/11/20  1:22 PM  Result Value Ref Range   Lactic Acid, Venous 1.1 0.5 - 1.9 mmol/L  Comprehensive metabolic panel     Status: Abnormal   Collection Time: 01/11/20  1:22 PM  Result Value Ref Range   Sodium 144 135 - 145 mmol/L   Potassium 4.0 3.5 - 5.1 mmol/L   Chloride 109 98 - 111 mmol/L   CO2 26 22 - 32 mmol/L   Glucose, Bld 111 (H) 70 - 99 mg/dL   BUN 26 (H) 8 - 23 mg/dL   Creatinine, Ser 0.76 0.44 - 1.00 mg/dL   Calcium 9.6 8.9 - 10.3 mg/dL   Total Protein 7.2 6.5 - 8.1 g/dL   Albumin 3.6  3.5 - 5.0 g/dL   AST 24 15 - 41 U/L   ALT 19 0 - 44 U/L   Alkaline Phosphatase 80 38 - 126 U/L   Total Bilirubin 0.7 0.3 - 1.2 mg/dL   GFR, Estimated >60 >60 mL/min   Anion gap 9 5 - 15  CBC with Differential     Status: Abnormal   Collection Time: 01/11/20  1:22 PM  Result Value Ref Range   WBC 9.6 4.0 - 10.5 K/uL   RBC 4.30 3.87 - 5.11 MIL/uL   Hemoglobin 10.6 (L) 12.0 - 15.0 g/dL   HCT 36.4 36.0 - 46.0 %   MCV 84.7 80.0 - 100.0 fL   MCH 24.7 (L) 26.0 - 34.0 pg   MCHC 29.1 (L) 30.0 - 36.0 g/dL   RDW 23.5 (H) 11.5 - 15.5 %   Platelets 314 150 - 400 K/uL   nRBC 0.0 0.0 - 0.2 %   Neutrophils Relative % 65 %   Neutro Abs 6.2 1.7 - 7.7 K/uL   Lymphocytes Relative 18 %   Lymphs Abs 1.7 0.7 - 4.0 K/uL   Monocytes Relative 14 %   Monocytes Absolute 1.3 (H) 0.1 - 1.0 K/uL   Eosinophils Relative 2 %   Eosinophils Absolute 0.2 0.0 - 0.5 K/uL   Basophils Relative 1 %   Basophils Absolute 0.1 0.0 - 0.1 K/uL   WBC Morphology MORPHOLOGY UNREMARKABLE    Smear Review Normal platelet morphology    Immature Granulocytes 0 %   Abs Immature Granulocytes 0.04 0.00 - 0.07 K/uL   Polychromasia PRESENT    Target Cells PRESENT   Urinalysis, Complete w Microscopic Urine, Clean Catch     Status: Abnormal   Collection Time: 01/11/20  1:22 PM  Result Value Ref Range   Color, Urine YELLOW (A) YELLOW   APPearance HAZY (A) CLEAR   Specific Gravity, Urine 1.017 1.005 - 1.030   pH 5.0 5.0 - 8.0   Glucose, UA NEGATIVE NEGATIVE mg/dL   Hgb urine dipstick NEGATIVE NEGATIVE   Bilirubin Urine NEGATIVE NEGATIVE   Ketones, ur NEGATIVE NEGATIVE mg/dL   Protein, ur NEGATIVE NEGATIVE mg/dL   Nitrite NEGATIVE NEGATIVE   Leukocytes,Ua NEGATIVE NEGATIVE   RBC / HPF 0-5 0 - 5 RBC/hpf   WBC, UA 0-5 0 - 5 WBC/hpf   Bacteria, UA NONE SEEN NONE SEEN   Squamous Epithelial /  LPF NONE SEEN 0 - 5   Mucus PRESENT   Resp Panel by RT-PCR (Flu A&B, Covid) Nasopharyngeal Swab     Status: None   Collection Time: 01/11/20   8:21 PM   Specimen: Nasopharyngeal Swab; Nasopharyngeal(NP) swabs in vial transport medium  Result Value Ref Range   SARS Coronavirus 2 by RT PCR NEGATIVE NEGATIVE   Influenza A by PCR NEGATIVE NEGATIVE   Influenza B by PCR NEGATIVE NEGATIVE   ____________________________________________  EKG My review and personal interpretation at Time: 16:27   Indication: afib  Rate: 140  Rhythm: afib Axis: normal Other: normal intervals, no stemi ____________________________________________  RADIOLOGY  I personally reviewed all radiographic images ordered to evaluate for the above acute complaints and reviewed radiology reports and findings.  These findings were personally discussed with the patient.  Please see medical record for radiology report. ____________________________________________   PROCEDURES  Procedure(s) performed:  .Critical Care Performed by: Merlyn Lot, MD Authorized by: Merlyn Lot, MD   Critical care provider statement:    Critical care time (minutes):  35   Critical care time was exclusive of:  Separately billable procedures and treating other patients   Critical care was necessary to treat or prevent imminent or life-threatening deterioration of the following conditions:  Circulatory failure   Critical care was time spent personally by me on the following activities:  Development of treatment plan with patient or surrogate, discussions with consultants, evaluation of patient's response to treatment, examination of patient, obtaining history from patient or surrogate, ordering and performing treatments and interventions, ordering and review of laboratory studies, ordering and review of radiographic studies, pulse oximetry, re-evaluation of patient's condition and review of old charts      Critical Care performed: yes ____________________________________________   INITIAL IMPRESSION / [place] / ED COURSE  Pertinent labs & imaging results  that were available during my care of the patient were reviewed by me and considered in my medical decision making (see chart for details).   DDX: DVT, cellulitis, venous stasis, A. fib, CHF, anemia  [name] [name] is a 86 y.o. who presents to the ED with presentation as described above.  Patient with evidence of chronic venous stasis ulceration to the left leg with some serous drainage.  Does appear to be mildly volume overloaded.  No hypoxia by examination noted tachycardic irregularly irregular heart rate.  EKG confirms A. fib with RVR.  She not complaining of chest pain right now.  Attempted rate control with amiodarone as well as IV Lopressor and metoprolol without success therefore transition to Cardizem bolus and infusion with appropriate rate control.  Case discussed with hospitalist for admission.     The patient was evaluated in Emergency Department today for the symptoms described in the history of present illness. He/she was evaluated in the context of the global COVID-19 pandemic, which necessitated consideration that the patient might be at risk for infection with the SARS-CoV-2 virus that causes COVID-19. Institutional protocols and algorithms that pertain to the evaluation of patients at risk for COVID-19 are in a state of rapid change based on information released by regulatory bodies including the CDC and federal and state organizations. These policies and algorithms were followed during the patient's care in the ED.  As part of my medical decision making, I reviewed the following data within the [place] notes reviewed and incorporated, Labs reviewed, notes from prior ED visits and [place] Controlled Substance Database   ____________________________________________  FINAL CLINICAL IMPRESSION(S) / ED DIAGNOSES  Final diagnoses:  Rapid atrial fibrillation (HCC)      NEW MEDICATIONS STARTED DURING THIS VISIT:  New Prescriptions   No medications  on file     Note:  This document was prepared using Dragon voice recognition software and may include unintentional dictation errors.    Merlyn Lot, MD 01/11/20 2325

## 2020-01-11 NOTE — H&P (Signed)
History and Physical    [name] TDD:[id number] DOB: 08-02-1929 DOA: 01/11/2020  PCP: [name], MD   Patient coming from: Home  I have personally briefly reviewed patient's old medical records in [place]  Chief Complaint: Leg swelling  HPI: [name] [name] is a 86 y.o. female with medical history significant for atrial fibrillation on Eliquis, HTN, recently hospitalized from 12/2-12/5 with cellulitis of left leg secondary to an infected and bleeding leg wound with anemia requiring blood transfusion, treated with Rocephin and vancomycin, and who has not used Eliquis since presents to the emergency room with complaints of persistent left leg swelling.  She denies pain in the leg or bleeding from the wound.  She also has concerns for which medication she supposed to be on.  Was previously on amiodarone, metoprolol, verapamil but states she has only been using the verapamil.  She denies fever or chills, denies cough, chest pain or shortness of breath. ED Course: On arrival, temperature was 99.1, BP 143/89, heart rate 130, O2 sat 96% on room air.  Blood work significant for hemoglobin of 10.6 but otherwise unremarkable.  Lactic acid normal.  Urinalysis unremarkable.  Covid and flu test negative. EKG as reviewed by me : A. fib at rate of 138 Imaging: Chest x-ray shows interstitial prominence and lower lobe opacities could reflect edema or infection. Left lower extremity venous Doppler negative Left tib-fib x-ray:Improving subcutaneous soft tissue edema over the past 11 days. No soft tissue air or radiographic findings of osteomyelitis  Due to rapid A. fib and patient not taking her medication, she was administered an oral dose of amiodarone and metoprolol in the emergency room and she was subsequently administered an IV bolus dose of diltiazem and started on a diltiazem infusion with improvement in rate to 100-110.  Hospitalist consulted for admission.   Review of  Systems: As per HPI otherwise all other systems on review of systems negative.    Past Medical History:  Diagnosis Date  . Anxiety   . Cancer ([place])    Squamous Cell Carcinoma (top of head)  . Constipation, chronic   . Deaf, left   . Diverticular disease   . Hearing aid worn   . Hearing deficit   . History of Bell's palsy    left side - resolved - 50 yrs ago  . Hypertension   . Internal hemorrhoids   . Osteopenia   . Pure hypercholesterolemia     Past Surgical History:  Procedure Laterality Date  . CATARACT EXTRACTION W/PHACO Left 11/16/2015   Procedure: CATARACT EXTRACTION PHACO AND INTRAOCULAR LENS PLACEMENT (IOC);  Surgeon: Leandrew Koyanagi, MD;  Location: [facility];  Service: Ophthalmology;  Laterality: Left;  TORIC  . CATARACT EXTRACTION W/PHACO Right 12/07/2015   Procedure: CATARACT EXTRACTION PHACO AND INTRAOCULAR LENS PLACEMENT (IOC);  Surgeon: Leandrew Koyanagi, MD;  Location: [facility];  Service: Ophthalmology;  Laterality: Right;  TORIC  . COLONOSCOPY WITH PROPOFOL N/A 08/10/2014   Procedure: COLONOSCOPY WITH PROPOFOL;  Surgeon: Lollie Sails, MD;  [facility] ENDOSCOPY;  Service: Endoscopy;  Laterality: N/A;  . FEMUR FRACTURE SURGERY    . Nasal Polyp Removal       reports that she quit smoking about 34 years ago. She has never used smokeless tobacco. She reports that she does not drink alcohol and does not use drugs.  Allergies  Allergen Reactions  . Bactrim [Sulfamethoxazole-Trimethoprim] Other (See Comments)    Hearing loss  . Ciprofloxacin Other (See Comments)  Flu-like symptoms  . Pneumococcal Vaccines Other (See Comments)    Flu-like symptoms    History reviewed. No pertinent family history.    Prior to Admission medications   Medication Sig Start Date End Date Taking? Authorizing Provider  amiodarone (PACERONE) 200 MG tablet Take 1 tablet (200 mg total) by mouth daily. Patient not taking: Reported on 12/31/2019 08/29/19    Sharen Hones, MD  apixaban (ELIQUIS) 2.5 MG TABS tablet Take 1 tablet (2.5 mg total) by mouth 2 (two) times daily. Patient not taking: Reported on 12/31/2019 08/28/19   Sharen Hones, MD  ascorbic acid (VITAMIN C) 1000 MG tablet Take 1,000 mg by mouth daily.    [provider]  aspirin EC 81 MG EC tablet Take 1 tablet (81 mg total) by mouth daily. Swallow whole. Patient not taking: Reported on 12/31/2019 08/29/19   Sharen Hones, MD  calcium carbonate 1250 MG capsule Take 1,250 mg by mouth 2 (two) times daily with a meal.     [provider]  Cholecalciferol 25 MCG (1000 UT) tablet Take 1 tablet by mouth in the morning and at bedtime.    [provider]  fluticasone (FLONASE) 50 MCG/ACT nasal spray Place 2 sprays into both nostrils daily as needed for rhinitis.    [provider]  folic acid (FOLVITE) 673 MCG tablet Take 800 mcg by mouth daily.    [provider]  Glucosamine HCl (GLUCOSAMINE PO) Take 2 Doses by mouth at bedtime.     [provider]  hydrochlorothiazide (HYDRODIURIL) 25 MG tablet Take 25 mg by mouth daily. 12/28/19   [provider]  iron polysaccharides (NIFEREX) 150 MG capsule Take 1 capsule (150 mg total) by mouth daily. 01/03/20   Sharen Hones, MD  lidocaine (LIDODERM) 5 % Place 1 patch onto the skin every 12 (twelve) hours. Remove & Discard patch within 12 hours or as directed by MD Patient not taking: Reported on 08/26/2019 07/23/19 07/22/20  Sable Feil, PA-C  lovastatin (MEVACOR) 40 MG tablet Take 40 mg by mouth at bedtime.    [provider]  metoprolol tartrate (LOPRESSOR) 25 MG tablet Take 1 tablet (25 mg total) by mouth 2 (two) times daily. Patient not taking: Reported on 12/31/2019 08/28/19   Sharen Hones, MD  Multiple Vitamin (MULTIVITAMIN) tablet Take 1 tablet by mouth daily.    [provider]  oxyCODONE-acetaminophen (PERCOCET/ROXICET) 5-325 MG tablet Take 1 tablet by mouth every 6 (six)  hours as needed for moderate pain or severe pain. Patient not taking: Reported on 12/31/2019 08/28/19   Sharen Hones, MD  polyethylene glycol (MIRALAX / GLYCOLAX) 17 g packet Take 17 g by mouth at bedtime. Takes 1 packet in the morning also if needed    [provider]  tretinoin (RETIN-A) 0.05 % cream Apply 1 Dose topically at bedtime.    [provider]    Physical Exam: Vitals:   01/11/20 2130 01/11/20 2135 01/11/20 2150 01/11/20 2200  BP: 107/83  129/79   Pulse: (!) 108 (!) 113 77 (!) 120  Resp: 19  (!) 27 (!) 22  Temp:      TempSrc:      SpO2: 94%  94% 93%  Weight:      Height:         Vitals:   01/11/20 2130 01/11/20 2135 01/11/20 2150 01/11/20 2200  BP: 107/83  129/79   Pulse: (!) 108 (!) 113 77 (!) 120  Resp: 19  (!) 27 (!) 22  Temp:      TempSrc:      SpO2: 94%  94% 93%  Weight:      Height:          Constitutional: Alert and oriented x 3 . Not in any apparent distress HEENT:      Head: Normocephalic and atraumatic.         Eyes: PERLA, EOMI, Conjunctivae are normal. Sclera is non-icteric.       Mouth/Throat: Mucous membranes are moist.       Neck: Supple with no signs of meningismus. Cardiovascular:  Irregularly irregular, tachycardic. No murmurs, gallops, or rubs. 2+ symmetrical distal pulses are present . No JVD. No 1+ on the right 2+ on the left LE edema Respiratory: Respiratory effort normal .Lungs sounds clear bilaterally. No wheezes, crackles, or rhonchi.  Gastrointestinal: Soft, non tender, and non distended with positive bowel sounds.  Genitourinary: No CVA tenderness. Musculoskeletal: Nontender with normal range of motion in all extremities.  Swelling redness warmth left lower extremity with ulcer left anterior shin Neurologic:  Face is symmetric. Moving all extremities. No gross focal neurologic deficits . Skin:  Ulcer lower anterior shin 3 cm diameter without oozing.  Surrounding redness warmth swelling Psychiatric: Mood and affect  are normal    Labs on Admission: I have personally reviewed following labs and imaging studies  CBC: Recent Labs  Lab 01/11/20 1322  WBC 9.6  NEUTROABS 6.2  HGB 10.6*  HCT 36.4  MCV 84.7  PLT 588   Basic Metabolic Panel: Recent Labs  Lab 01/11/20 1322  NA 144  K 4.0  CL 109  CO2 26  GLUCOSE 111*  BUN 26*  CREATININE 0.76  CALCIUM 9.6   GFR: Estimated Creatinine Clearance: 40.2 mL/min (by C-G formula based on SCr of 0.76 mg/dL). Liver Function Tests: Recent Labs  Lab 01/11/20 1322  AST 24  ALT 19  ALKPHOS 80  BILITOT 0.7  PROT 7.2  ALBUMIN 3.6   No results for input(s): LIPASE, AMYLASE in the last 168 hours. No results for input(s): AMMONIA in the last 168 hours. Coagulation Profile: No results for input(s): INR, PROTIME in the last 168 hours. Cardiac Enzymes: No results for input(s): CKTOTAL, CKMB, CKMBINDEX, TROPONINI in the last 168 hours. BNP (last 3 results) No results for input(s): PROBNP in the last 8760 hours. HbA1C: No results for input(s): HGBA1C in the last 72 hours. CBG: No results for input(s): GLUCAP in the last 168 hours. Lipid Profile: No results for input(s): CHOL, HDL, LDLCALC, TRIG, CHOLHDL, LDLDIRECT in the last 72 hours. Thyroid Function Tests: No results for input(s): TSH, T4TOTAL, FREET4, T3FREE, THYROIDAB in the last 72 hours. Anemia Panel: No results for input(s): VITAMINB12, FOLATE, FERRITIN, TIBC, IRON, RETICCTPCT in the last 72 hours. Urine analysis:    Component Value Date/Time   COLORURINE YELLOW (A) 01/11/2020 1322   APPEARANCEUR HAZY (A) 01/11/2020 1322   APPEARANCEUR Cloudy 05/31/2012 2019   LABSPEC 1.017 01/11/2020 1322   LABSPEC 1.011 05/31/2012 2019   PHURINE 5.0 01/11/2020 1322   GLUCOSEU NEGATIVE 01/11/2020 1322   GLUCOSEU 50 mg/dL 05/31/2012 2019   HGBUR NEGATIVE 01/11/2020 1322   BILIRUBINUR NEGATIVE 01/11/2020 1322   BILIRUBINUR Negative 05/31/2012 2019   KETONESUR NEGATIVE 01/11/2020 1322   PROTEINUR  NEGATIVE 01/11/2020 1322   NITRITE NEGATIVE 01/11/2020 1322   LEUKOCYTESUR NEGATIVE 01/11/2020 1322   LEUKOCYTESUR Negative 05/31/2012 2019    Radiological Exams on Admission: DG Tibia/Fibula Left  Result Date: 01/11/2020 CLINICAL DATA:  Left  leg wound, swelling. Left leg cellulitis. Increasing redness and swelling. EXAM: LEFT TIBIA AND FIBULA - 2 VIEW COMPARISON:  Radiograph 12/31/2019 FINDINGS: No bony destruction or evidence of osteomyelitis. No fracture. The cortical margins of the tibia and fibular intact. Degenerative change about the knee, stable from prior. Ankle alignment is maintained. Generalized subcutaneous soft tissue edema which is improved from prior radiograph. No soft tissue air. No radiopaque foreign body. IMPRESSION: Improving subcutaneous soft tissue edema over the past 11 days. No soft tissue air or radiographic findings of osteomyelitis. Electronically Signed   By: Keith Rake M.D.   On: 01/11/2020 17:52   US Venous Img Lower Unilateral Left  Result Date: 01/11/2020 CLINICAL DATA:  Left leg wound with swelling EXAM: Left LOWER EXTREMITY VENOUS DOPPLER ULTRASOUND TECHNIQUE: Gray-scale sonography with compression, as well as color and duplex ultrasound, were performed to evaluate the deep venous system(s) from the level of the common femoral vein through the popliteal and proximal calf veins. COMPARISON:  Radiograph 01/11/2020 FINDINGS: VENOUS Normal compressibility of the common femoral, superficial femoral, and popliteal veins, as well as the visualized calf veins. Visualized portions of profunda femoral vein and great saphenous vein unremarkable. No filling defects to suggest DVT on grayscale or color Doppler imaging. Doppler waveforms show normal direction of venous flow, normal respiratory plasticity and response to augmentation. Limited views of the contralateral common femoral vein are unremarkable. OTHER None. Limitations: none IMPRESSION: Negative. Electronically  Signed   By: Donavan Foil M.D.   On: 01/11/2020 18:18   DG Chest Portable 1 View  Result Date: 01/11/2020 CLINICAL DATA:  Shortness of breath evaluate for edema EXAM: PORTABLE CHEST 1 VIEW COMPARISON:  12/31/2019 FINDINGS: Heart is borderline in size. Bilateral interstitial prominence and lower lobe airspace opacities. Small bilateral effusions. No acute bony abnormality. IMPRESSION: Interstitial prominence and lower lobe opacities could reflect edema or infection. Small bilateral effusions. Electronically Signed   By: Rolm Baptise M.D.   On: 01/11/2020 21:12     Assessment/Plan 86 year old female with history of atrial fibrillation on Eliquis, HTN, recently hospitalized from 12/2-12/5 with cellulitis of left leg secondary to a bleeding infected left leg wound  requiring blood transfusion, treated with Rocephin and vancomycin, and who has not used Eliquis since presenting with persistent left leg swelling and found to be in rapid A. fib.      Rapid atrial fibrillation (HCC)   Chronic anticoagulation -Patient reports that she misplaced discharge instructions and had not been using her amiodarone or metoprolol but had been taking verapamil that she had at home.  Neither was she taking her Eliquis for fear of bleeding from the wound -Continue diltiazem infusion started in the emergency room -Patient received a dose of oral amiodarone and metoprolol in the ER so will resume in the a.m. -Continue Eliquis   Cellulitis left lower leg  Chronic ulcer of left leg ([place]) -Patient recently treated for an infection of left leg ulcer -Lower extremity Doppler negative for DVT -Tib-fib x-ray showing improvement in subcutaneous soft tissue edema with no findings to suggest osteomyelitis -IV Rocephin -Daily wound care and keep leg elevated   HTN (hypertension) -Continuing diltiazem as above metoprolol and amiodarone    Iron deficiency anemia -Hemoglobin 10.6, up from 8.3 -No evidence of active  bleeding/oozing from wound -Iron deficiency anemia was due to chronic blood loss from wound, requiring infusion 2 weeks prior during hospitalization    DVT prophylaxis: Eliquis Code Status: full code  Family Communication:  none  Disposition  Plan: Back to previous home environment Consults called: none  Status:At the time of admission, it appears that the appropriate admission status for this patient is INPATIENT. This is judged to be reasonable and necessary in order to provide the required intensity of service to ensure the patient's safety given the presenting symptoms, physical exam findings, and initial radiographic and laboratory data in the context of their  Comorbid conditions.   Patient requires inpatient status due to high intensity of service, high risk for further deterioration and high frequency of surveillance required.   I certify that at the point of admission it is my clinical judgment that the patient will require inpatient hospital care spanning beyond [place] MD [facility]     01/11/2020, 10:01 PM

## 2020-01-11 NOTE — ED Notes (Signed)
HR noted to be variable 116-143 when rechecking VS.  12 lead EKG done.  Patient states she has history of afib.

## 2020-01-11 NOTE — ED Triage Notes (Signed)
Pt states she was admitted for 4 days with left leg cellulitis and states since discharge the swelling and redness has become worse and is not improving, states she has been taking medications as prescribed but lost her discharge instructions

## 2020-01-12 ENCOUNTER — Encounter: Payer: Self-pay | Admitting: Internal Medicine

## 2020-01-12 LAB — LIPID PANEL
Cholesterol: 107 mg/dL (ref 0–200)
HDL: 46 mg/dL (ref >40)
LDL Cholesterol: 44 mg/dL (ref 0–99)
Total CHOL/HDL Ratio: 2.3 RATIO
Triglycerides: 84 mg/dL (ref <150)
VLDL: 17 mg/dL (ref 0–40)

## 2020-01-12 LAB — BASIC METABOLIC PANEL
Anion gap: 10 (ref 5–15)
BUN: 23 mg/dL (ref 8–23)
CO2: 23 mmol/L (ref 22–32)
Calcium: 8.5 mg/dL — ABNORMAL LOW (ref 8.9–10.3)
Chloride: 105 mmol/L (ref 98–111)
Creatinine, Ser: 0.73 mg/dL (ref 0.44–1.00)
GFR, Estimated: >60 mL/min (ref >60)
Glucose, Bld: 111 mg/dL — ABNORMAL HIGH (ref 70–99)
Potassium: 3.3 mmol/L — ABNORMAL LOW (ref 3.5–5.1)
Sodium: 138 mmol/L (ref 135–145)

## 2020-01-12 LAB — CBC
HCT: 33.1 % — ABNORMAL LOW (ref 36.0–46.0)
Hemoglobin: 9.7 g/dL — ABNORMAL LOW (ref 12.0–15.0)
MCH: 24.7 pg — ABNORMAL LOW (ref 26.0–34.0)
MCHC: 29.3 g/dL — ABNORMAL LOW (ref 30.0–36.0)
MCV: 84.4 fL (ref 80.0–100.0)
Platelets: 283 10*3/uL (ref 150–400)
RBC: 3.92 MIL/uL (ref 3.87–5.11)
RDW: 23.4 % — ABNORMAL HIGH (ref 11.5–15.5)
WBC: 11.5 10*3/uL — ABNORMAL HIGH (ref 4.0–10.5)
nRBC: 0 % (ref 0.0–0.2)

## 2020-01-12 LAB — MAGNESIUM: Magnesium: 2 mg/dL (ref 1.7–2.4)

## 2020-01-12 MED ORDER — VERAPAMIL HCL ER 180 MG PO TBCR
180.0000 mg | EXTENDED_RELEASE_TABLET | Freq: Every day | ORAL | Status: DC
  Administered 2020-01-12 – 2020-01-16 (×5): 180 mg via ORAL
  Filled 2020-01-12 (×5): qty 1

## 2020-01-12 MED ORDER — RIVAROXABAN 15 MG PO TABS
15.0000 mg | ORAL_TABLET | Freq: Every day | ORAL | Status: DC
  Administered 2020-01-12 – 2020-01-14 (×3): 15 mg via ORAL
  Filled 2020-01-12 (×3): qty 1

## 2020-01-12 MED ORDER — CEFAZOLIN SODIUM-DEXTROSE 2-4 GM/100ML-% IV SOLN
2.0000 g | Freq: Three times a day (TID) | INTRAVENOUS | Status: DC
  Administered 2020-01-12 – 2020-01-14 (×8): 2 g via INTRAVENOUS
  Filled 2020-01-12 (×15): qty 100

## 2020-01-12 MED ORDER — DOUBLE ANTIBIOTIC 500-10000 UNIT/GM EX OINT
TOPICAL_OINTMENT | Freq: Every day | CUTANEOUS | Status: DC
  Administered 2020-01-12: 1 via TOPICAL
  Filled 2020-01-12 (×3): qty 28.4

## 2020-01-12 MED ORDER — POTASSIUM CHLORIDE CRYS ER 20 MEQ PO TBCR
20.0000 meq | EXTENDED_RELEASE_TABLET | Freq: Two times a day (BID) | ORAL | Status: DC
  Administered 2020-01-12 – 2020-01-16 (×9): 20 meq via ORAL
  Filled 2020-01-12 (×4): qty 1
  Filled 2020-01-12: qty 2
  Filled 2020-01-12 (×3): qty 1
  Filled 2020-01-12: qty 2

## 2020-01-12 MED ORDER — POLYETHYLENE GLYCOL 3350 17 G PO PACK
17.0000 g | PACK | Freq: Every day | ORAL | Status: DC
  Administered 2020-01-13 – 2020-01-16 (×3): 17 g via ORAL
  Filled 2020-01-12 (×3): qty 1

## 2020-01-12 MED ORDER — VANCOMYCIN HCL IN DEXTROSE 1-5 GM/200ML-% IV SOLN
1000.0000 mg | INTRAVENOUS | Status: DC
  Administered 2020-01-12 – 2020-01-13 (×2): 1000 mg via INTRAVENOUS
  Filled 2020-01-12 (×3): qty 200

## 2020-01-12 NOTE — ED Notes (Signed)
Pt placed back on 2LNC, keeps pulling O2 and pulse ox sensor off

## 2020-01-12 NOTE — Consult Note (Signed)
ANTICOAGULATION CONSULT NOTE - Initial Consult  Pharmacy Consult for Xarelto Indication: Non-valvular atrial fibrillation  Allergies  Allergen Reactions   Bactrim [Sulfamethoxazole-Trimethoprim] Other (See Comments)    Hearing loss   Ciprofloxacin Other (See Comments)    Flu-like symptoms   Pneumococcal Vaccines Other (See Comments)    Flu-like symptoms    Patient Measurements: Height: 5\' 3"  (160 cm) Weight: 56.7 kg (125 lb) IBW/kg (Calculated) : 52.4  Vital Signs: Temp: 97.9 F (36.6 C) (12/14 0737) Temp Source: Oral (12/14 0737) BP: 107/77 (12/14 0730) Pulse Rate: 102 (12/14 0730)  Labs: Recent Labs    01/11/20 1322 01/12/20 0441  HGB 10.6* 9.7*  HCT 36.4 33.1*  PLT 314 283  CREATININE 0.76 0.73    Estimated Creatinine Clearance: 40.2 mL/min (by C-G formula based on SCr of 0.73 mg/dL).   Medical History: Past Medical History:  Diagnosis Date   Anxiety    Cancer ([place])    Squamous Cell Carcinoma (top of head)   Constipation, chronic    Deaf, left    Diverticular disease    Hearing aid worn    Hearing deficit    History of Bell's palsy    left side - resolved - 50 yrs ago   Hypertension    Internal hemorrhoids    Osteopenia    Pure hypercholesterolemia     Medications/Assessment:  Pt on Xarelto prior to admission, but per pt report has not been taking - pt initially ordered Eliquis on admission, but pharmacy has been consulted to re-initiate Xarelto.    Goal of Therapy:  Monitor platelets by anticoagulation protocol: Yes   Plan:  Will dc Eliquis order and start Xarelto 15mg  qd  (per renal function CrCl < 50 ml/min)  Lu Duffel, PharmD, BCPS Clinical Pharmacist 01/12/2020 8:25 AM

## 2020-01-12 NOTE — Consult Note (Signed)
Pharmacy Antibiotic Note  [name] [name] is a 86 y.o. female admitted on 01/11/2020 with cellulitis.  Pharmacy has been consulted for Vancomycin dosing.  Plan: Vancomycin 1000 IV every 24 hours.  Goal trough 10-15 mcg/mL.  Height: 5\' 3"  (160 cm) Weight: 56.7 kg (125 lb) IBW/kg (Calculated) : 52.4  Temp (24hrs), Avg:98.4 F (36.9 C), Min:97.9 F (36.6 C), Max:99.1 F (37.3 C)  Recent Labs  Lab 01/11/20 1322 01/12/20 0441  WBC 9.6 11.5*  CREATININE 0.76 0.73  LATICACIDVEN 1.1  --     Estimated Creatinine Clearance: 40.2 mL/min (by C-G formula based on SCr of 0.73 mg/dL).    Allergies  Allergen Reactions  . Bactrim [Sulfamethoxazole-Trimethoprim] Other (See Comments)    Hearing loss  . Ciprofloxacin Other (See Comments)    Flu-like symptoms  . Pneumococcal Vaccines Other (See Comments)    Flu-like symptoms    Antimicrobials this admission: Cefazolin 12/14 >> Vancomycin 12/14 >>  Dose adjustments this admission: N/a  Microbiology results: None pending  Thank you for allowing pharmacy to be a part of this patient's care.  Lu Duffel, PharmD, BCPS Clinical Pharmacist 01/12/2020 8:23 AM

## 2020-01-12 NOTE — ED Notes (Signed)
Called pharmacy for more diltiazem medication, states that they will send. Pt HR currently 92

## 2020-01-12 NOTE — Consult Note (Signed)
[facility] Nurse wound consult note Consultation was completed by review of records, images and assistance from the bedside nurse/clinical staff.   Reason for Consult: leg wound Wound type: infectious; partial thickness areas LLE Pressure Injury POA: NA Measurement:see nursing flow sheets Wound UIV:HOYWV serous crust over 3 areas distal pretibial and lateral calf  Drainage (amount, consistency, odor) not able to assess, no dressings in place in images, no drainage noted in image Periwound:edema, mild erythema  Dressing procedure/placement/frequency: Cleanse with saline, apply antibiotic ointment, cover with xeroform gauze. Change daily. Teach patient or CG to perform  Discussed POC with bedside nurse via secure chat. Re consult if needed, will not follow at this time. Thanks  Bev Drennen [employer], RN,CWOCN, CNS, [place] [phone])

## 2020-01-12 NOTE — ED Notes (Signed)
R hearing aid found in sheets, placed on specimen cup and with pt's other belongings.

## 2020-01-12 NOTE — Progress Notes (Signed)
PROGRESS NOTE    [name] [name]  RUE:[id number] DOB: 1929-03-07 DOA: 01/11/2020 PCP: [name], MD  Outpatient Specialists: [facility]    Brief Narrative:  [name] [name] is a 86 y.o. female with medical history significant for atrial fibrillation on Eliquis, HTN, recently hospitalized from 12/2-12/5 with cellulitis of left leg secondary to an infected and bleeding leg wound with anemia requiring blood transfusion, treated with Rocephin and vancomycin, and who has not used Eliquis since presents to the emergency room with complaints of persistent left leg swelling.  She denies pain in the leg or bleeding from the wound.  She also has concerns for which medication she supposed to be on.  Was previously on amiodarone, metoprolol, verapamil but states she has only been using the verapamil.  She denies fever or chills, denies cough, chest pain or shortness of breath.   Assessment & Plan:   Principal Problem:   Rapid atrial fibrillation (HCC) Active Problems:   HTN (hypertension)   Left leg cellulitis   Chronic anticoagulation   Chronic ulcer of left leg (HCC)   Iron deficiency anemia  # Rapid atrial fibrillation (HCC) Not on home metoprol/amiodarone which was discharged with earlier this month. per last cardiology visit note it's possible pcp had previously stopped those meds, question patient didn't tolerate, but was on those meds at recent hospitalization and hr was controlled. On dilt gtt overnight, hr controlled, bp tolerating. Was also off eliquis at home, fear of bleeding from lle ulcer - stop dilt gtt - resume home metoprolol, diltiazem, amiodarone - cont eliquis   # Cellulitis left lower leg Venous stasis ulcers b/l left worse than right, with surrounding erythema patient says has worsened in terms of swelling/redness/heat. Unclear whether compliant w/ home abx. Was treated w/ vanc/ceftriaxone recent hospitalization, d/c with augmentin/doxy. Dopplers  neg. cxr w/o signs osteo - cont ancef, add vanc, likely able to wean at some point -photo of leg added to media -Lower extremity Doppler negative for DVT - wound care consult  # HTN  Here bp wnl - dilt, metop, as above - hold home hctz while coming off dilt gtt and adding oral dilt/metop  # Iron deficiency anemia 2/2 oozing LLE ulcer recent hospitalization. Here Hemoglobin 10.6, up from 8.3 previously. No active bleeding. Required transfusion hospitilization earlier this month - monitor  # hypokalemia Mild 3.3, likely 2/2 hctz - f/u mg - kcl 20 bid   DVT prophylaxis: rivaroxaban Code Status: full Family Communication: none @ bedside  Status is: Inpatient  Remains inpatient appropriate because:severity of illness   Dispo: The patient is from: Home              Anticipated d/c is to: tbd              Anticipated d/c date is: 1-3 days              Patient currently is not medically stable to d/c.        Consultants:  none  Procedures: none  Antimicrobials:  Ancef 12/13> vanc 12/14>    Subjective: This morning feels well, back a bit sore, leeft leg feels warm, no fevers, no n/v  Objective: Vitals:   01/12/20 0600 01/12/20 0615 01/12/20 0645 01/12/20 0700  BP: 115/70 124/64 125/67 122/69  Pulse: 84 92 85 74  Resp: 17 19 17 19   Temp:      TempSrc:      SpO2: 93% 94%  96%  Weight:  Height:       No intake or output data in the 24 hours ending 01/12/20 0734 Filed Weights   01/11/20 1319  Weight: 56.7 kg    Examination:  General exam: Appears calm and comfortable  Respiratory system: Clear to auscultation. Respiratory effort normal. Faint rales @ bases Cardiovascular system: S1 & S2 heard, irreg irreg, soft systolic murmur Gastrointestinal system: Abdomen is nondistended, soft and nontender. No organomegaly or masses felt. Normal bowel sounds heard. Central nervous system: Alert and oriented. No focal neurological deficits. Extremities:  Symmetric 5 x 5 power. Skin:     Psychiatry: Judgement and insight appear normal. Mood & affect appropriate.     Data Reviewed: I have personally reviewed following labs and imaging studies  CBC: Recent Labs  Lab 01/11/20 1322 01/12/20 0441  WBC 9.6 11.5*  NEUTROABS 6.2  --   HGB 10.6* 9.7*  HCT 36.4 33.1*  MCV 84.7 84.4  PLT 314 384   Basic Metabolic Panel: Recent Labs  Lab 01/11/20 1322 01/12/20 0441  NA 144 138  K 4.0 3.3*  CL 109 105  CO2 26 23  GLUCOSE 111* 111*  BUN 26* 23  CREATININE 0.76 0.73  CALCIUM 9.6 8.5*   GFR: Estimated Creatinine Clearance: 40.2 mL/min (by C-G formula based on SCr of 0.73 mg/dL). Liver Function Tests: Recent Labs  Lab 01/11/20 1322  AST 24  ALT 19  ALKPHOS 80  BILITOT 0.7  PROT 7.2  ALBUMIN 3.6   No results for input(s): LIPASE, AMYLASE in the last 168 hours. No results for input(s): AMMONIA in the last 168 hours. Coagulation Profile: No results for input(s): INR, PROTIME in the last 168 hours. Cardiac Enzymes: No results for input(s): CKTOTAL, CKMB, CKMBINDEX, TROPONINI in the last 168 hours. BNP (last 3 results) No results for input(s): PROBNP in the last 8760 hours. HbA1C: No results for input(s): HGBA1C in the last 72 hours. CBG: No results for input(s): GLUCAP in the last 168 hours. Lipid Profile: Recent Labs    01/12/20 0441  CHOL 107  HDL 46  LDLCALC 44  TRIG 84  CHOLHDL 2.3   Thyroid Function Tests: No results for input(s): TSH, T4TOTAL, FREET4, T3FREE, THYROIDAB in the last 72 hours. Anemia Panel: No results for input(s): VITAMINB12, FOLATE, FERRITIN, TIBC, IRON, RETICCTPCT in the last 72 hours. Urine analysis:    Component Value Date/Time   COLORURINE YELLOW (A) 01/11/2020 1322   APPEARANCEUR HAZY (A) 01/11/2020 1322   APPEARANCEUR Cloudy 05/31/2012 2019   LABSPEC 1.017 01/11/2020 1322   LABSPEC 1.011 05/31/2012 2019   PHURINE 5.0 01/11/2020 1322   GLUCOSEU NEGATIVE 01/11/2020 1322    GLUCOSEU 50 mg/dL 05/31/2012 2019   HGBUR NEGATIVE 01/11/2020 1322   BILIRUBINUR NEGATIVE 01/11/2020 1322   BILIRUBINUR Negative 05/31/2012 2019   KETONESUR NEGATIVE 01/11/2020 1322   PROTEINUR NEGATIVE 01/11/2020 1322   NITRITE NEGATIVE 01/11/2020 1322   LEUKOCYTESUR NEGATIVE 01/11/2020 1322   LEUKOCYTESUR Negative 05/31/2012 2019   Sepsis Labs: @LABRCNTIP (procalcitonin:4,lacticidven:4)  ) Recent Results (from the past 240 hour(s))  Resp Panel by RT-PCR (Flu A&B, Covid) Nasopharyngeal Swab     Status: None   Collection Time: 01/11/20  8:21 PM   Specimen: Nasopharyngeal Swab; Nasopharyngeal(NP) swabs in vial transport medium  Result Value Ref Range Status   SARS Coronavirus 2 by RT PCR NEGATIVE NEGATIVE Final    Comment: (NOTE) SARS-CoV-2 target nucleic acids are NOT DETECTED.  The SARS-CoV-2 RNA is generally detectable in upper respiratory specimens during the acute  phase of infection. The lowest concentration of SARS-CoV-2 viral copies this assay can detect is 138 copies/mL. A negative result does not preclude SARS-Cov-2 infection and should not be used as the sole basis for treatment or other patient management decisions. A negative result may occur with  improper specimen collection/handling, submission of specimen other than nasopharyngeal swab, presence of viral mutation(s) within the areas targeted by this assay, and inadequate number of viral copies(<138 copies/mL). A negative result must be combined with clinical observations, patient history, and epidemiological information. The expected result is Negative.  Fact Sheet for Patients:  EntrepreneurPulse.com.au  Fact Sheet for Healthcare Providers:  IncredibleEmployment.be  This test is no t yet approved or cleared by the [place] FDA and  has been authorized for detection and/or diagnosis of SARS-CoV-2 by FDA under an Emergency Use Authorization (EUA). This EUA will remain   in effect (meaning this test can be used) for the duration of the COVID-19 declaration under Section 564(b)(1) of the Act, 21 U.S.C.section 360bbb-3(b)(1), unless the authorization is terminated  or revoked sooner.       Influenza A by PCR NEGATIVE NEGATIVE Final   Influenza B by PCR NEGATIVE NEGATIVE Final    Comment: (NOTE) The Xpert Xpress SARS-CoV-2/FLU/RSV plus assay is intended as an aid in the diagnosis of influenza from Nasopharyngeal swab specimens and should not be used as a sole basis for treatment. Nasal washings and aspirates are unacceptable for Xpert Xpress SARS-CoV-2/FLU/RSV testing.  Fact Sheet for Patients: EntrepreneurPulse.com.au  Fact Sheet for Healthcare Providers: IncredibleEmployment.be  This test is not yet approved or cleared by the [place] FDA and has been authorized for detection and/or diagnosis of SARS-CoV-2 by FDA under an Emergency Use Authorization (EUA). This EUA will remain in effect (meaning this test can be used) for the duration of the COVID-19 declaration under Section 564(b)(1) of the Act, 21 U.S.C. section 360bbb-3(b)(1), unless the authorization is terminated or revoked.  Performed at [facility], [address] [place], [place] [address]          Radiology Studies: DG Tibia/Fibula Left  Result Date: 01/11/2020 CLINICAL DATA:  Left leg wound, swelling. Left leg cellulitis. Increasing redness and swelling. EXAM: LEFT TIBIA AND FIBULA - 2 VIEW COMPARISON:  Radiograph 12/31/2019 FINDINGS: No bony destruction or evidence of osteomyelitis. No fracture. The cortical margins of the tibia and fibular intact. Degenerative change about the knee, stable from prior. Ankle alignment is maintained. Generalized subcutaneous soft tissue edema which is improved from prior radiograph. No soft tissue air. No radiopaque foreign body. IMPRESSION: Improving subcutaneous soft tissue edema over the  past 11 days. No soft tissue air or radiographic findings of osteomyelitis. Electronically Signed   By: Keith Rake M.D.   On: 01/11/2020 17:52   US Venous Img Lower Unilateral Left  Result Date: 01/11/2020 CLINICAL DATA:  Left leg wound with swelling EXAM: Left LOWER EXTREMITY VENOUS DOPPLER ULTRASOUND TECHNIQUE: Gray-scale sonography with compression, as well as color and duplex ultrasound, were performed to evaluate the deep venous system(s) from the level of the common femoral vein through the popliteal and proximal calf veins. COMPARISON:  Radiograph 01/11/2020 FINDINGS: VENOUS Normal compressibility of the common femoral, superficial femoral, and popliteal veins, as well as the visualized calf veins. Visualized portions of profunda femoral vein and great saphenous vein unremarkable. No filling defects to suggest DVT on grayscale or color Doppler imaging. Doppler waveforms show normal direction of venous flow, normal respiratory plasticity and response to augmentation. Limited  views of the contralateral common femoral vein are unremarkable. OTHER None. Limitations: none IMPRESSION: Negative. Electronically Signed   By: Donavan Foil M.D.   On: 01/11/2020 18:18   DG Chest Portable 1 View  Result Date: 01/11/2020 CLINICAL DATA:  Shortness of breath evaluate for edema EXAM: PORTABLE CHEST 1 VIEW COMPARISON:  12/31/2019 FINDINGS: Heart is borderline in size. Bilateral interstitial prominence and lower lobe airspace opacities. Small bilateral effusions. No acute bony abnormality. IMPRESSION: Interstitial prominence and lower lobe opacities could reflect edema or infection. Small bilateral effusions. Electronically Signed   By: Rolm Baptise M.D.   On: 01/11/2020 21:12        Scheduled Meds: . amiodarone  200 mg Oral Daily  . apixaban  2.5 mg Oral BID  . hydrochlorothiazide  25 mg Oral Daily  . iron polysaccharides  150 mg Oral Daily  . metoprolol tartrate  25 mg Oral BID  . pravastatin  40  mg Oral q1800   Continuous Infusions: . sodium chloride Stopped (01/11/20 2352)  .  ceFAZolin (ANCEF) IV Stopped (01/12/20 0731)  . diltiazem (CARDIZEM) infusion 12.5 mg/hr (01/12/20 0730)     LOS: 1 day    Time spent: 58 min    Desma Maxim, MD [facility]   If 7PM-7AM, please contact night-coverage [email or web address] Password [facility] 01/12/2020, 7:34 AM

## 2020-01-12 NOTE — ED Notes (Signed)
Pt states she ate most of her lunch and 8 oz water

## 2020-01-12 NOTE — ED Notes (Signed)
Pharmacy contacted regarding pt dose of cefazolin, pharmacy to send medication to ED

## 2020-01-12 NOTE — ED Notes (Signed)
Pt up to bathroom for BM. Ambulated with walker to commode in room without difficulty.

## 2020-01-12 NOTE — ED Notes (Addendum)
Pt given breakfast. Ate approx 50% of breakfast and 8 oz fluid.

## 2020-01-13 DIAGNOSIS — L97929 Non-pressure chronic ulcer of unspecified part of left lower leg with unspecified severity: Secondary | ICD-10-CM

## 2020-01-13 DIAGNOSIS — Z7901 Long term (current) use of anticoagulants: Secondary | ICD-10-CM

## 2020-01-13 DIAGNOSIS — L03116 Cellulitis of left lower limb: Secondary | ICD-10-CM

## 2020-01-13 LAB — BASIC METABOLIC PANEL
Anion gap: 9 (ref 5–15)
BUN: 30 mg/dL — ABNORMAL HIGH (ref 8–23)
CO2: 24 mmol/L (ref 22–32)
Calcium: 8.8 mg/dL — ABNORMAL LOW (ref 8.9–10.3)
Chloride: 108 mmol/L (ref 98–111)
Creatinine, Ser: 0.76 mg/dL (ref 0.44–1.00)
GFR, Estimated: >60 mL/min (ref >60)
Glucose, Bld: 122 mg/dL — ABNORMAL HIGH (ref 70–99)
Potassium: 4.2 mmol/L (ref 3.5–5.1)
Sodium: 141 mmol/L (ref 135–145)

## 2020-01-13 LAB — CBC
HCT: 35.2 % — ABNORMAL LOW (ref 36.0–46.0)
Hemoglobin: 10.4 g/dL — ABNORMAL LOW (ref 12.0–15.0)
MCH: 24.7 pg — ABNORMAL LOW (ref 26.0–34.0)
MCHC: 29.5 g/dL — ABNORMAL LOW (ref 30.0–36.0)
MCV: 83.6 fL (ref 80.0–100.0)
Platelets: 323 10*3/uL (ref 150–400)
RBC: 4.21 MIL/uL (ref 3.87–5.11)
RDW: 23.6 % — ABNORMAL HIGH (ref 11.5–15.5)
WBC: 9.3 10*3/uL (ref 4.0–10.5)
nRBC: 0 % (ref 0.0–0.2)

## 2020-01-13 LAB — MAGNESIUM: Magnesium: 2.3 mg/dL (ref 1.7–2.4)

## 2020-01-13 LAB — MRSA PCR SCREENING: MRSA by PCR: NEGATIVE

## 2020-01-13 MED ORDER — DIGOXIN 0.25 MG/ML IJ SOLN
0.1250 mg | Freq: Every day | INTRAMUSCULAR | Status: DC
  Administered 2020-01-14 – 2020-01-15 (×2): 0.125 mg via INTRAVENOUS
  Filled 2020-01-13 (×2): qty 2

## 2020-01-13 MED ORDER — DIGOXIN 0.25 MG/ML IJ SOLN
0.5000 mg | Freq: Once | INTRAMUSCULAR | Status: AC
  Administered 2020-01-13: 0.5 mg via INTRAVENOUS
  Filled 2020-01-13: qty 2

## 2020-01-13 MED ORDER — DIGOXIN 0.25 MG/ML IJ SOLN
0.2500 mg | Freq: Once | INTRAMUSCULAR | Status: AC
  Administered 2020-01-14: 0.25 mg via INTRAVENOUS
  Filled 2020-01-13: qty 2

## 2020-01-13 MED ORDER — METOPROLOL TARTRATE 50 MG PO TABS
50.0000 mg | ORAL_TABLET | Freq: Two times a day (BID) | ORAL | Status: DC
  Administered 2020-01-13 – 2020-01-16 (×6): 50 mg via ORAL
  Filled 2020-01-13 (×6): qty 1

## 2020-01-13 MED ORDER — METOPROLOL TARTRATE 25 MG PO TABS
25.0000 mg | ORAL_TABLET | Freq: Once | ORAL | Status: AC
  Administered 2020-01-13: 25 mg via ORAL
  Filled 2020-01-13: qty 1

## 2020-01-13 NOTE — Consult Note (Signed)
CARDIOLOGY CONSULT NOTE               Patient ID: [name] [name] MRN: [id number] DOB/AGE: [birth year] 86 y.o.  Admit date: 01/11/2020 Referring Physician Dr. Judd Gaudier hospitalist Primary Physician Dr. Netty Starring primary Primary Cardiologist Dr. Nehemiah Massed Reason for Consultation atrial fibrillation rapid ventricular response  HPI: Patient is a 86 year old female history of atrial fibrillation on Eliquis recently hospitalized for cellulitis of lower extremity patient was seen again and found to have rapid atrial fibrillation and was advised to come to the emergency room patient had had some issues with misunderstanding and noncompliance with the prescribed regimen so she was not on Eliquis and had not taken some of her rate control medications like amiodarone and metoprolol but has been taking her verapamil she denied any fever chills or sweats no chest pain or shortness of breath does not necessarily notice the tachycardia tachycardia and denies palpitation she was Covid negative patient has been seen and evaluated treated by Dr. Nehemiah Massed in the past has been normal attempt at electrical cardioversion in the past  Review of systems complete and found to be negative unless listed above     Past Medical History:  Diagnosis Date  . Anxiety   . Cancer ([place])    Squamous Cell Carcinoma (top of head)  . Constipation, chronic   . Deaf, left   . Diverticular disease   . Hearing aid worn   . Hearing deficit   . History of Bell's palsy    left side - resolved - 50 yrs ago  . Hypertension   . Internal hemorrhoids   . Osteopenia   . Pure hypercholesterolemia     Past Surgical History:  Procedure Laterality Date  . CATARACT EXTRACTION W/PHACO Left 11/16/2015   Procedure: CATARACT EXTRACTION PHACO AND INTRAOCULAR LENS PLACEMENT (IOC);  Surgeon: Leandrew Koyanagi, MD;  Location: [facility];  Service: Ophthalmology;  Laterality: Left;  TORIC  . CATARACT EXTRACTION  W/PHACO Right 12/07/2015   Procedure: CATARACT EXTRACTION PHACO AND INTRAOCULAR LENS PLACEMENT (IOC);  Surgeon: Leandrew Koyanagi, MD;  Location: [facility];  Service: Ophthalmology;  Laterality: Right;  TORIC  . COLONOSCOPY WITH PROPOFOL N/A 08/10/2014   Procedure: COLONOSCOPY WITH PROPOFOL;  Surgeon: Lollie Sails, MD;  Location: [facility] ENDOSCOPY;  Service: Endoscopy;  Laterality: N/A;  . FEMUR FRACTURE SURGERY    . Nasal Polyp Removal      Medications Prior to Admission  Medication Sig Dispense Refill Last Dose  . ascorbic acid (VITAMIN C) 1000 MG tablet Take 1,000 mg by mouth 2 (two) times daily.   01/11/2020 at Unknown time  . B Complex-C (B-COMPLEX WITH VITAMIN C) tablet Take 1 tablet by mouth daily.   01/11/2020 at Unknown time  . calcium carbonate 1250 MG capsule Take 2 capsules by mouth 2 (two) times daily with a meal.   01/11/2020 at Unknown time  . Cholecalciferol 25 MCG (1000 UT) tablet Take 1 tablet by mouth in the morning and at bedtime.   01/11/2020 at Unknown time  . folic acid (FOLVITE) 322 MCG tablet Take 800 mcg by mouth daily.   01/11/2020 at Unknown time  . Glucosamine HCl (GLUCOSAMINE PO) Take 2 Doses by mouth at bedtime.    Past Week at Unknown time  . hydrochlorothiazide (HYDRODIURIL) 25 MG tablet Take 25 mg by mouth daily.   01/11/2020 at Unknown time  . iron polysaccharides (NIFEREX) 150 MG capsule Take 1 capsule (150 mg total) by mouth daily. 30 capsule  0 01/11/2020 at Unknown time  . lovastatin (MEVACOR) 40 MG tablet Take 40 mg by mouth at bedtime.   01/11/2020 at Unknown time  . selenium 50 MCG TABS tablet Take 50 mcg by mouth daily.   01/11/2020 at Unknown time  . tretinoin (RETIN-A) 0.05 % cream Apply 1 Dose topically at bedtime.   Past Week at Unknown time  . verapamil (CALAN-SR) 180 MG CR tablet Take 180 mg by mouth daily.   01/11/2020 at Unknown time  . rivaroxaban (XARELTO) 20 MG TABS tablet Take 20 mg by mouth daily with supper.      Social  History   Socioeconomic History  . Marital status: Widowed    Spouse name: Not on file  . Number of children: Not on file  . Years of education: Not on file  . Highest education level: Not on file  Occupational History  . Not on file  Tobacco Use  . Smoking status: Former Smoker    Quit date: 10/30/1985    Years since quitting: 34.2  . Smokeless tobacco: Never Used  Substance and Sexual Activity  . Alcohol use: No  . Drug use: No  . Sexual activity: Not on file  Other Topics Concern  . Not on file  Social History Narrative  . Not on file   Social Determinants of Health   Financial Resource Strain: Not on file  Food Insecurity: Not on file  Transportation Needs: Not on file  Physical Activity: Not on file  Stress: Not on file  Social Connections: Not on file  Intimate Partner Violence: Not on file    History reviewed. No pertinent family history.    Review of systems complete and found to be negative unless listed above      PHYSICAL EXAM  General: Well developed, well nourished, in no acute distress HEENT:  Normocephalic and atramatic Neck:  No JVD.  Lungs: Clear bilaterally to auscultation and percussion. Heart: Irregular irregular tachycardic. Normal S1 and S2 without gallops or murmurs.  Abdomen: Bowel sounds are positive, abdomen soft and non-tender  Msk:  Back normal, normal gait. Normal strength and tone for age. Extremities: No clubbing, cyanosis or bilateral red cellulitis lower extremities Neuro: Alert and oriented X 3. Psych:  Good affect, responds appropriately  Labs:   Lab Results  Component Value Date   WBC 9.3 01/13/2020   HGB 10.4 (L) 01/13/2020   HCT 35.2 (L) 01/13/2020   MCV 83.6 01/13/2020   PLT 323 01/13/2020    Recent Labs  Lab 01/11/20 1322 01/12/20 0441 01/13/20 0506  NA 144   < > 141  K 4.0   < > 4.2  CL 109   < > 108  CO2 26   < > 24  BUN 26*   < > 30*  CREATININE 0.76   < > 0.76  CALCIUM 9.6   < > 8.8*  PROT 7.2  --    --   BILITOT 0.7  --   --   ALKPHOS 80  --   --   ALT 19  --   --   AST 24  --   --   GLUCOSE 111*   < > 122*   < > = values in this interval not displayed.   No results found for: CKTOTAL, CKMB, CKMBINDEX, TROPONINI  Lab Results  Component Value Date   CHOL 107 01/12/2020   Lab Results  Component Value Date   HDL 46 01/12/2020   Lab Results  Component Value Date   LDLCALC 44 01/12/2020   Lab Results  Component Value Date   TRIG 84 01/12/2020   Lab Results  Component Value Date   CHOLHDL 2.3 01/12/2020   No results found for: LDLDIRECT    Radiology: DG Tibia/Fibula Left  Result Date: 01/11/2020 CLINICAL DATA:  Left leg wound, swelling. Left leg cellulitis. Increasing redness and swelling. EXAM: LEFT TIBIA AND FIBULA - 2 VIEW COMPARISON:  Radiograph 12/31/2019 FINDINGS: No bony destruction or evidence of osteomyelitis. No fracture. The cortical margins of the tibia and fibular intact. Degenerative change about the knee, stable from prior. Ankle alignment is maintained. Generalized subcutaneous soft tissue edema which is improved from prior radiograph. No soft tissue air. No radiopaque foreign body. IMPRESSION: Improving subcutaneous soft tissue edema over the past 11 days. No soft tissue air or radiographic findings of osteomyelitis. Electronically Signed   By: Keith Rake M.D.   On: 01/11/2020 17:52   DG Tibia/Fibula Left  Result Date: 12/31/2019 CLINICAL DATA:  Left leg wound evaluate for osteomyelitis EXAM: LEFT TIBIA AND FIBULA - 2 VIEW COMPARISON:  None. FINDINGS: There is diffuse edematous swelling of the lower extremity. More focal irregularity and ulceration with superficial cutaneous lucency and overlying bandaging material is seen along the anterior aspect of the distal lower leg, superficial to the lower third tibial diaphysis. No other soft tissue gas or foreign body. No subjacent osseous erosion, destruction or periostitis changes are seen to suggest underlying  osteomyelitis at this location or elsewhere within the lower extremity. Mild-to-moderate tricompartmental degenerative changes are noted in the knee with chondrocalcinosis of the medial and lateral menisci. Additional mild degenerative changes of the ankle. No sizable ankle effusion. IMPRESSION: 1. Soft tissue irregularity and ulceration along the anterior aspect of the distal lower leg, superficial to the lower third tibial diaphysis. No radiographic evidence of osteomyelitis. 2. Diffuse edematous swelling of the lower extremity. 3. Degenerative changes in the knee and ankle. 4. 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 Electronically Signed   By: Lovena Le M.D.   On: 12/31/2019 20:27   US Venous Img Lower Unilateral Left  Result Date: 01/11/2020 CLINICAL DATA:  Left leg wound with swelling EXAM: Left LOWER EXTREMITY VENOUS DOPPLER ULTRASOUND TECHNIQUE: Gray-scale sonography with compression, as well as color and duplex ultrasound, were performed to evaluate the deep venous system(s) from the level of the common femoral vein through the popliteal and proximal calf veins. COMPARISON:  Radiograph 01/11/2020 FINDINGS: VENOUS Normal compressibility of the common femoral, superficial femoral, and popliteal veins, as well as the visualized calf veins. Visualized portions of profunda femoral vein and great saphenous vein unremarkable. No filling defects to suggest DVT on grayscale or color Doppler imaging. Doppler waveforms show normal direction of venous flow, normal respiratory plasticity and response to augmentation. Limited views of the contralateral common femoral vein are unremarkable. OTHER None. Limitations: none IMPRESSION: Negative. Electronically Signed   By: Donavan Foil M.D.   On: 01/11/2020 18:18   DG Chest Portable 1 View  Result Date: 01/11/2020 CLINICAL DATA:  Shortness of breath evaluate for edema EXAM: PORTABLE CHEST 1 VIEW COMPARISON:   12/31/2019 FINDINGS: Heart is borderline in size. Bilateral interstitial prominence and lower lobe airspace opacities. Small bilateral effusions. No acute bony abnormality. IMPRESSION: Interstitial prominence and lower lobe opacities could reflect edema or infection. Small bilateral effusions. Electronically Signed   By: Rolm Baptise M.D.   On: 01/11/2020 21:12   DG Chest  Portable 1 View  Result Date: 12/31/2019 CLINICAL DATA:  86 year old female with weakness. EXAM: PORTABLE CHEST 1 VIEW COMPARISON:  Chest radiograph dated 07/26/2019. FINDINGS: There is background of emphysema and chronic interstitial coarsening and bronchitic changes. No focal consolidation, pleural effusion or pneumothorax. The cardiac silhouette is within limits. Atherosclerotic calcification of the aorta. No acute osseous pathology. Osteopenia with degenerative changes of the spine. IMPRESSION: No active disease. Electronically Signed   By: Anner Crete M.D.   On: 12/31/2019 20:27    EKG: Atrial fibrillation with rapid ventricular response  ASSESSMENT AND PLAN:  Atrial fibrillation RVR Cellulitis bilateral lower extremities Hypertension Anxiety Anemia Possible lower lobe pneumonia versus edema . Plan Agree with telemetry Continue follow-up EKGs and troponins Agree with broad-spectrum antibiotic therapy for cellulitis Continue hypertension management and control Rate control with Lopressor amiodarone HCTZ Maintain anticoagulation with Eliquis 2.5 twice a day Increase amiodarone load p.o. 200 twice a day We will institute digoxin therapy to help with rate management   Signed: [name] MD 01/13/2020, 4:50 PM

## 2020-01-13 NOTE — Hospital Course (Signed)
Per 12/14 note by Dr. Si Raider: "[name] [name] is a 86 y.o. female with medical history significant for atrial fibrillation on Eliquis, HTN, recently hospitalized from 12/2-12/5 with cellulitis of left leg secondary to an infected and bleeding leg wound with anemia requiring blood transfusion, treated with Rocephin and vancomycin, and who has not used Eliquis since presents to the emergency room with complaints of persistent left leg swelling.  She denies pain in the leg or bleeding from the wound.  She also has concerns for which medication she supposed to be on.  Was previously on amiodarone, metoprolol, verapamil but states she has only been using the verapamil.  She denies fever or chills, denies cough, chest pain or shortness of breath."

## 2020-01-13 NOTE — Progress Notes (Signed)
PROGRESS NOTE    [name] [name]   ZJI:[id number]  DOB: 01/09/1930  PCP: [name], MD    DOA: 01/11/2020 LOS: 2   Brief Narrative   Per 12/14 note by Dr. Si Raider: "[name] [name] is a 86 y.o. female with medical history significant for atrial fibrillation on Eliquis, HTN, recently hospitalized from 12/2-12/5 with cellulitis of left leg secondary to an infected and bleeding leg wound with anemia requiring blood transfusion, treated with Rocephin and vancomycin, and who has not used Eliquis since presents to the emergency room with complaints of persistent left leg swelling.  She denies pain in the leg or bleeding from the wound.  She also has concerns for which medication she supposed to be on.  Was previously on amiodarone, metoprolol, verapamil but states she has only been using the verapamil.  She denies fever or chills, denies cough, chest pain or shortness of breath."     Assessment & Plan   Principal Problem:   Rapid atrial fibrillation (HCC) Active Problems:   HTN (hypertension)   Left leg cellulitis   Chronic anticoagulation   Chronic ulcer of left leg (HCC)   Iron deficiency anemia   Atrial fibrillation with RVR - POA.  Appears was not on home metoprol/amiodarone which pt was discharged with earlier this month. Per last cardiology visit note, it's possible PCP had previously stopped those meds, question patient didn't tolerate, but was on those meds at recent hospitalization and HR was controlled at that time.  --Initially treated with Cardizem infusion, rate controlled and infusion stopped --Resumed on home metoprolol, verapamil, amiodarone --Patient had been off anticoagulation at home due to bleeding (or fear of of bleeding) from LLE ulcer --Resumed on Xarelto   Left lower extremity cellulitis -patient has chronic venous stasis with ulcers bilaterally, worse on the left. Left side has surrounding erythema and warmth, was weeping at home per  patient. Recent admission for this 12/2-5, treated with vancomycin and Rocephin, discharged on Augmentin and doxy that patient states she completed. Lower extremity Doppler was negative for DVT. Left tib-fib x-ray showed no evidence of osteomyelitis, only subcutaneous soft tissue edema which has improved over 11 days from prior image. --Currently on Ancef as well as vancomycin.  Stop Vanco, continue Ancef. --Monitor legs closely --Wound care consult  Essential hypertension -chronic, controlled.  Continue metoprolol and verapamil as above.  Hold HCTZ  Iron deficiency anemia-possibly due to oozing from left lower extremity ulcer.  Hemoglobin improved to around 10, was 8.3 prior admission.  Of note, patient did require blood transfusion during prior admission.  Currently no signs of bleeding. --Monitor CBC  Hypokalemia -replaced.  Likely due at least in part to HCTZ which has been held.  Monitor BMP and replace as needed.    DVT prophylaxis:  Rivaroxaban (XARELTO) tablet 15 mg   Diet:  Diet Orders (From admission, onward)    Start     Ordered   01/11/20 2159  Diet Heart Room service appropriate? Yes; Fluid consistency: Thin  Diet effective now       Question Answer Comment  Room service appropriate? Yes   Fluid consistency: Thin      01/11/20 2200            Code Status: Full Code    Subjective 01/13/20    Patient seen this morning at bedside.  She reports that she had finished her antibiotics for the cellulitis after discharge last time.  With her A. fib, she denies any chest  pain, palpitations or feeling her heart race, shortness of breath or other symptoms with it.   Disposition Plan & Communication   Status is: Inpatient  Remains inpatient appropriate because:IV treatments appropriate due to intensity of illness or inability to take PO   Dispo: The patient is from: Home              Anticipated d/c is to: Home              Anticipated d/c date is: 2 days               Patient currently is not medically stable to d/c.   Family Communication: None at bedside, will attempt to call   Consults, Procedures, Significant Events   Consultants:   Cardiology  Wound care  Procedures:   None  Antimicrobials:  Anti-infectives (From admission, onward)   Start     Dose/Rate Route Frequency Ordered Stop   01/12/20 0900  vancomycin (VANCOCIN) IVPB 1000 mg/200 mL premix        1,000 mg 200 mL/hr over 60 Minutes Intravenous Every 24 hours 01/12/20 0803     01/12/20 0015  ceFAZolin (ANCEF) IVPB 2g/100 mL premix        2 g 200 mL/hr over 30 Minutes Intravenous Every 8 hours 01/12/20 0011          Objective   Vitals:   01/13/20 0700 01/13/20 0800 01/13/20 0941 01/13/20 1213  BP: 121/71 (!) 128/93 (!) 144/95 114/70  Pulse: (!) 117 (!) 120 (!) 131 (!) 115  Resp: 20 (!) 22 19 20   Temp:  97.9 F (36.6 C) 98.4 F (36.9 C) 98.3 F (36.8 C)  TempSrc:  Oral Oral Oral  SpO2: 92% 94% 95% 96%  Weight:   57.5 kg   Height:   5\' 3"  (1.6 m)     Intake/Output Summary (Last 24 hours) at 01/13/2020 1548 Last data filed at 01/13/2020 0810 Gross per 24 hour  Intake 200 ml  Output --  Net 200 ml   Filed Weights   01/11/20 1319 01/13/20 0941  Weight: 56.7 kg 57.5 kg    Physical Exam:  General exam: awake, alert, no acute distress HEENT: Hard of hearing, moist mucus membranes Respiratory system: CTAB, no wheezes, rales or rhonchi, normal respiratory effort. Cardiovascular system: normal S1/S2, RRR, no pedal edema.   Gastrointestinal system: soft, NT, ND Central nervous system: A&O x3. no gross focal neurologic deficits, normal speech Extremities: Left lower extremity with gauze dressing intact overlying erythema, no edema, normal tone Psychiatry: normal mood, congruent affect, judgement and insight appear normal  Labs   Data Reviewed: I have personally reviewed following labs and imaging studies  CBC: Recent Labs  Lab 01/11/20 1322  01/12/20 0441 01/13/20 0506  WBC 9.6 11.5* 9.3  NEUTROABS 6.2  --   --   HGB 10.6* 9.7* 10.4*  HCT 36.4 33.1* 35.2*  MCV 84.7 84.4 83.6  PLT 314 283 025   Basic Metabolic Panel: Recent Labs  Lab 01/11/20 1322 01/12/20 0441 01/13/20 0506  NA 144 138 141  K 4.0 3.3* 4.2  CL 109 105 108  CO2 26 23 24   GLUCOSE 111* 111* 122*  BUN 26* 23 30*  CREATININE 0.76 0.73 0.76  CALCIUM 9.6 8.5* 8.8*  MG  --  2.0 2.3   GFR: Estimated Creatinine Clearance: 40.2 mL/min (by C-G formula based on SCr of 0.76 mg/dL). Liver Function Tests: Recent Labs  Lab 01/11/20 1322  AST 24  ALT 19  ALKPHOS 80  BILITOT 0.7  PROT 7.2  ALBUMIN 3.6   No results for input(s): LIPASE, AMYLASE in the last 168 hours. No results for input(s): AMMONIA in the last 168 hours. Coagulation Profile: No results for input(s): INR, PROTIME in the last 168 hours. Cardiac Enzymes: No results for input(s): CKTOTAL, CKMB, CKMBINDEX, TROPONINI in the last 168 hours. BNP (last 3 results) No results for input(s): PROBNP in the last 8760 hours. HbA1C: No results for input(s): HGBA1C in the last 72 hours. CBG: No results for input(s): GLUCAP in the last 168 hours. Lipid Profile: Recent Labs    01/12/20 0441  CHOL 107  HDL 46  LDLCALC 44  TRIG 84  CHOLHDL 2.3   Thyroid Function Tests: No results for input(s): TSH, T4TOTAL, FREET4, T3FREE, THYROIDAB in the last 72 hours. Anemia Panel: No results for input(s): VITAMINB12, FOLATE, FERRITIN, TIBC, IRON, RETICCTPCT in the last 72 hours. Sepsis Labs: Recent Labs  Lab 01/11/20 1322  LATICACIDVEN 1.1    Recent Results (from the past 240 hour(s))  Resp Panel by RT-PCR (Flu A&B, Covid) Nasopharyngeal Swab     Status: None   Collection Time: 01/11/20  8:21 PM   Specimen: Nasopharyngeal Swab; Nasopharyngeal(NP) swabs in vial transport medium  Result Value Ref Range Status   SARS Coronavirus 2 by RT PCR NEGATIVE NEGATIVE Final    Comment: (NOTE) SARS-CoV-2  target nucleic acids are NOT DETECTED.  The SARS-CoV-2 RNA is generally detectable in upper respiratory specimens during the acute phase of infection. The lowest concentration of SARS-CoV-2 viral copies this assay can detect is 138 copies/mL. A negative result does not preclude SARS-Cov-2 infection and should not be used as the sole basis for treatment or other patient management decisions. A negative result may occur with  improper specimen collection/handling, submission of specimen other than nasopharyngeal swab, presence of viral mutation(s) within the areas targeted by this assay, and inadequate number of viral copies(<138 copies/mL). A negative result must be combined with clinical observations, patient history, and epidemiological information. The expected result is Negative.  Fact Sheet for Patients:  EntrepreneurPulse.com.au  Fact Sheet for Healthcare Providers:  IncredibleEmployment.be  This test is no t yet approved or cleared by the [place] FDA and  has been authorized for detection and/or diagnosis of SARS-CoV-2 by FDA under an Emergency Use Authorization (EUA). This EUA will remain  in effect (meaning this test can be used) for the duration of the COVID-19 declaration under Section 564(b)(1) of the Act, 21 U.S.C.section 360bbb-3(b)(1), unless the authorization is terminated  or revoked sooner.       Influenza A by PCR NEGATIVE NEGATIVE Final   Influenza B by PCR NEGATIVE NEGATIVE Final    Comment: (NOTE) The Xpert Xpress SARS-CoV-2/FLU/RSV plus assay is intended as an aid in the diagnosis of influenza from Nasopharyngeal swab specimens and should not be used as a sole basis for treatment. Nasal washings and aspirates are unacceptable for Xpert Xpress SARS-CoV-2/FLU/RSV testing.  Fact Sheet for Patients: EntrepreneurPulse.com.au  Fact Sheet for Healthcare  Providers: IncredibleEmployment.be  This test is not yet approved or cleared by the [place] FDA and has been authorized for detection and/or diagnosis of SARS-CoV-2 by FDA under an Emergency Use Authorization (EUA). This EUA will remain in effect (meaning this test can be used) for the duration of the COVID-19 declaration under Section 564(b)(1) of the Act, 21 U.S.C. section 360bbb-3(b)(1), unless the authorization is terminated or revoked.  Performed at [facility],  [place], [place] [address]       Imaging Studies   DG Tibia/Fibula Left  Result Date: 01/11/2020 CLINICAL DATA:  Left leg wound, swelling. Left leg cellulitis. Increasing redness and swelling. EXAM: LEFT TIBIA AND FIBULA - 2 VIEW COMPARISON:  Radiograph 12/31/2019 FINDINGS: No bony destruction or evidence of osteomyelitis. No fracture. The cortical margins of the tibia and fibular intact. Degenerative change about the knee, stable from prior. Ankle alignment is maintained. Generalized subcutaneous soft tissue edema which is improved from prior radiograph. No soft tissue air. No radiopaque foreign body. IMPRESSION: Improving subcutaneous soft tissue edema over the past 11 days. No soft tissue air or radiographic findings of osteomyelitis. Electronically Signed   By: Keith Rake M.D.   On: 01/11/2020 17:52   US Venous Img Lower Unilateral Left  Result Date: 01/11/2020 CLINICAL DATA:  Left leg wound with swelling EXAM: Left LOWER EXTREMITY VENOUS DOPPLER ULTRASOUND TECHNIQUE: Gray-scale sonography with compression, as well as color and duplex ultrasound, were performed to evaluate the deep venous system(s) from the level of the common femoral vein through the popliteal and proximal calf veins. COMPARISON:  Radiograph 01/11/2020 FINDINGS: VENOUS Normal compressibility of the common femoral, superficial femoral, and popliteal veins, as well as the visualized calf veins.  Visualized portions of profunda femoral vein and great saphenous vein unremarkable. No filling defects to suggest DVT on grayscale or color Doppler imaging. Doppler waveforms show normal direction of venous flow, normal respiratory plasticity and response to augmentation. Limited views of the contralateral common femoral vein are unremarkable. OTHER None. Limitations: none IMPRESSION: Negative. Electronically Signed   By: Donavan Foil M.D.   On: 01/11/2020 18:18   DG Chest Portable 1 View  Result Date: 01/11/2020 CLINICAL DATA:  Shortness of breath evaluate for edema EXAM: PORTABLE CHEST 1 VIEW COMPARISON:  12/31/2019 FINDINGS: Heart is borderline in size. Bilateral interstitial prominence and lower lobe airspace opacities. Small bilateral effusions. No acute bony abnormality. IMPRESSION: Interstitial prominence and lower lobe opacities could reflect edema or infection. Small bilateral effusions. Electronically Signed   By: Rolm Baptise M.D.   On: 01/11/2020 21:12     Medications   Scheduled Meds: . amiodarone  200 mg Oral Daily  . iron polysaccharides  150 mg Oral Daily  . metoprolol tartrate  25 mg Oral BID  . polyethylene glycol  17 g Oral Daily  . polymixin-bacitracin   Topical Daily  . potassium chloride  20 mEq Oral BID  . pravastatin  40 mg Oral q1800  . Rivaroxaban  15 mg Oral Q supper  . verapamil  180 mg Oral Daily   Continuous Infusions: .  ceFAZolin (ANCEF) IV 2 g (01/13/20 1503)  . vancomycin 1,000 mg (01/13/20 1026)       LOS: 2 days    Time spent: 30 minutes    Ezekiel Slocumb, DO [facility]  01/13/2020, 3:48 PM    If 7PM-7AM, please contact night-coverage. How to contact the [facility] Attending or Consulting provider [place] or covering provider during after hours [place], for this patient?    1. Check the care team in [facility] and look for a) attending/consulting TR[facility] provider listed and b) the [facility] team listed 2. Log into [email or web address] and use [place]'s  universal password to access. If you do not have the password, please contact the hospital operator. 3. Locate the [facility] provider you are looking for under [facility] and page to a number that you can  be directly reached. 4. If you still have difficulty reaching the provider, please page the [facility] (Director on Call) for the Hospitalists listed on amion for assistance.

## 2020-01-13 NOTE — Evaluation (Addendum)
Physical Therapy Evaluation Patient Details Name: [name] [name] MRN: [id number] DOB: [birth year] Today's Date: 01/13/2020   History of Present Illness  Pt admitted for rapid Afib with complaints of leg swelling. History includes Afib, HTN and a recent hospital stay secondary to cellulitis in L LE.  Clinical Impression  Pt is a pleasant 86 year old female who was admitted for rapid Afib and leg swelling. L LE bandaged at this time and she reports no pain. HR monitored throughout session at 112-136bpm. Cleared to participate from RN. Pt performs transfers with mod I and ambulation with supervision and rollator. Pt demonstrates deficits with strength/endurance/balance. For hallway mobility, recommend brief donned for incontinence. Would benefit from skilled PT to address above deficits and promote optimal return to PLOF. Recommend transition to [place] upon discharge from acute hospitalization.     Follow Up Recommendations Home health PT    Equipment Recommendations  None recommended by PT    Recommendations for Other Services       Precautions / Restrictions Precautions Precautions: Fall Restrictions Weight Bearing Restrictions: No      Mobility  Bed Mobility               General bed mobility comments: not performed as pt received in chair    Transfers Overall transfer level: Modified independent Equipment used:  (rollator)             General transfer comment: safe technique with ability to push from seated surface. Good eccentric control  Ambulation/Gait Ambulation/Gait assistance: Supervision Gait Distance (Feet): 120 Feet Assistive device:  (rollator) Gait Pattern/deviations: Step-through pattern     General Gait Details: ambulated multiple laps in room due to incontience episode and needing to be close to toilet. Safe technique and able to turn in tight spaces with rollator.  Stairs            Wheelchair Mobility    Modified Rankin (Stroke  Patients Only)       Balance Overall balance assessment: Needs assistance;History of Falls Sitting-balance support: Feet supported Sitting balance-Leahy Scale: Good     Standing balance support: Bilateral upper extremity supported Standing balance-Leahy Scale: Good                               Pertinent Vitals/Pain Pain Assessment: No/denies pain    Home Living Family/patient expects to be discharged to:: Private residence Living Arrangements: Alone Available Help at Discharge: Family;Available 24 hours/day (son is visiting from [place], will be here through the holidays) Type of Home: House Home Access: Stairs to enter Entrance Stairs-Rails: Can reach both Entrance Stairs-Number of Steps: 4 Home Layout: Multi-level;1/2 bath on main level;Bed/bath upstairs Home Equipment: Walker - 2 wheels;Walker - 4 wheels;Cane - single point Additional Comments: pt reports her bed is upstairs and she installed a stair lift to assist in navigation    Prior Function Level of Independence: Independent with assistive device(s)         Comments: Reports she is using rollater more often however is able to ambulate home environment without AD     Hand Dominance        Extremity/Trunk Assessment   Upper Extremity Assessment Upper Extremity Assessment: Overall WFL for tasks assessed    Lower Extremity Assessment Lower Extremity Assessment: Generalized weakness (B LE grossly 4+/5)       Communication   Communication: HOH;Deaf  Cognition Arousal/Alertness: Awake/alert Behavior During Therapy: WFL for tasks assessed/performed  Overall Cognitive Status: Within Functional Limits for tasks assessed                                        General Comments      Exercises     Assessment/Plan    PT Assessment    PT Problem List         PT Treatment Interventions      PT Goals (Current goals can be found in the Care Plan section)  Acute Rehab PT  Goals Patient Stated Goal: to go home PT Goal Formulation: With patient Time For Goal Achievement: 01/27/20 Potential to Achieve Goals: Good    Frequency     Barriers to discharge        Co-evaluation               AM-PAC PT "6 Clicks" Mobility  Outcome Measure Help needed turning from your back to your side while in a flat bed without using bedrails?: None Help needed moving from lying on your back to sitting on the side of a flat bed without using bedrails?: None Help needed moving to and from a bed to a chair (including a wheelchair)?: A Little Help needed standing up from a chair using your arms (e.g., wheelchair or bedside chair)?: A Little Help needed to walk in hospital room?: A Little Help needed climbing 3-5 steps with a railing? : A Little 6 Click Score: 20    End of Session Equipment Utilized During Treatment: Gait belt Activity Tolerance: Patient tolerated treatment well Patient left: in chair;with chair alarm set Nurse Communication: Mobility status PT Visit Diagnosis: Unsteadiness on feet (R26.81);Muscle weakness (generalized) (M62.81)    Time: 8786-7672 PT Time Calculation (min) (ACUTE ONLY): 21 min   Charges:   PT Evaluation $PT Eval Low Complexity: 1 Low PT Treatments $Gait Training: 8-22 mins        Greggory Stallion, PT, DPT [phone]   Rajesh Wyss 01/13/2020, 4:46 PM

## 2020-01-14 LAB — BASIC METABOLIC PANEL
Anion gap: 8 (ref 5–15)
BUN: 27 mg/dL — ABNORMAL HIGH (ref 8–23)
CO2: 24 mmol/L (ref 22–32)
Calcium: 8.6 mg/dL — ABNORMAL LOW (ref 8.9–10.3)
Chloride: 104 mmol/L (ref 98–111)
Creatinine, Ser: 0.65 mg/dL (ref 0.44–1.00)
GFR, Estimated: >60 mL/min (ref >60)
Glucose, Bld: 95 mg/dL (ref 70–99)
Potassium: 4.3 mmol/L (ref 3.5–5.1)
Sodium: 136 mmol/L (ref 135–145)

## 2020-01-14 LAB — CBC
HCT: 33.5 % — ABNORMAL LOW (ref 36.0–46.0)
Hemoglobin: 10 g/dL — ABNORMAL LOW (ref 12.0–15.0)
MCH: 24.9 pg — ABNORMAL LOW (ref 26.0–34.0)
MCHC: 29.9 g/dL — ABNORMAL LOW (ref 30.0–36.0)
MCV: 83.3 fL (ref 80.0–100.0)
Platelets: 308 10*3/uL (ref 150–400)
RBC: 4.02 MIL/uL (ref 3.87–5.11)
RDW: 23.4 % — ABNORMAL HIGH (ref 11.5–15.5)
WBC: 10.9 10*3/uL — ABNORMAL HIGH (ref 4.0–10.5)
nRBC: 0 % (ref 0.0–0.2)

## 2020-01-14 LAB — MAGNESIUM: Magnesium: 2.2 mg/dL (ref 1.7–2.4)

## 2020-01-14 MED ORDER — COLLAGENASE 250 UNIT/GM EX OINT
TOPICAL_OINTMENT | Freq: Every day | CUTANEOUS | Status: DC
  Filled 2020-01-14: qty 30

## 2020-01-14 MED ORDER — CEPHALEXIN 500 MG PO CAPS
500.0000 mg | ORAL_CAPSULE | Freq: Four times a day (QID) | ORAL | Status: DC
  Administered 2020-01-14 – 2020-01-16 (×9): 500 mg via ORAL
  Filled 2020-01-14 (×9): qty 1

## 2020-01-14 NOTE — Consult Note (Signed)
WOC Nurse Consult Note: Re-consult requested for left leg; refer to previous [place] consult notes from 12/14. Consult performed remotely today after review of progress notes and photos in the EMR.  Wound type: Left lower leg with patchy areas of full thickness venous stasis wounds; all are yellow slough and moist with mod amt yellow drainage.  Dressing procedure/placement/frequency: Topical treatment orders provided for bedside nurses to perform as follows to assist with removal of nonviable tissue: Change left leg dressings Q day as follows: clean LLE wounds with saline, pat dry. Apply Santyl and cover with moist gauze, then dry 4X4 and kerlix. Pt could benefit from follow-up at the outpatient wound care center after discharge; please order if desired.  Please re-consult if further assistance is needed.  Thank-you,  Julien Girt MSN, [place], [place], [place], [place]

## 2020-01-14 NOTE — Care Management Important Message (Signed)
Important Message  Patient Details  Name: [name] [name] MRN: [id number] Date of Birth: 08-Nov-1929   Medicare Important Message Given:  Yes  Reviewed with patient via room phone due to isolation status.  Copy of Medicare IM to be given to nursing staff to deliver to patient.   Dannette Barbara 01/14/2020, 1:42 PM

## 2020-01-14 NOTE — TOC Progression Note (Signed)
Transition of Care [facility]) - Progression Note    Patient Details  Name: [name] [name] MRN: [id number] Date of Birth: [birth year]  Transition [facility]) CM/SW [place], RN Phone Number: 01/14/2020, 1:18 PM  Clinical Narrative: Patient alert and oriented, hard of hearing, verbalize need for Home Health Nurse to do home visits and assist with changing bandage on lower leg. Called Jason from [facility], PT/OT/RN services arranged. Will continue to evaluate for [facility] discharge needs.           Expected Discharge Plan and Services                                                 Social Determinants of Health (SDOH) Interventions    Readmission Risk Interventions No flowsheet data found.

## 2020-01-14 NOTE — Evaluation (Signed)
Occupational Therapy Evaluation Patient Details Name: [name] [name] MRN: [id number] DOB: November 19, 1929 Today's Date: 01/14/2020    History of Present Illness Pt admitted for rapid Afib with complaints of leg swelling. History includes Afib, HTN and a recent hospital stay secondary to cellulitis in L LE.   Clinical Impression   Pt seen for OT evaluation this date in setting of hospitalization d/t LE swelling. Pt reports being MOD I for fxl mobility at baseline and INDEP for most basic self care, MOD I for bathing with shower chair. On assessment, pt does require some SUPV for use of RW for ADL transfers and fxl mobility to cue her for sequencing locking/unlocking walker brakes. Pt demos good tolerance and does not require hands-on assistance. Pt requires  SETUP/SBA for standing bathing/dressing of both UB/LB sink-side with seated rest break at halfway point. Pt tolerates fxl mobility with RW back to chair on opposite side of room. Pt left with chair alarm and all needs in reach. OT updates pt's CNA on her status and need for new purewick. Pt near her functional baseline, but does demos some near-misses with balance for prolonged standing tasks as well as generally decreased strength and fxl activity tolerance. Will continue to follow in acute setting and anticipate pt could benefit from [facility] f/u upon d/c.     Follow Up Recommendations  Home health OT    Equipment Recommendations  None recommended by OT (pt reports having all necessary equipment)    Recommendations for Other Services       Precautions / Restrictions Precautions Precautions: Fall Restrictions Weight Bearing Restrictions: No      Mobility Bed Mobility Overal bed mobility: Modified Independent                  Transfers Overall transfer level: Needs assistance   Transfers: Sit to/from Stand;Stand Pivot Transfers Sit to Stand: Supervision Stand pivot transfers: Supervision       General transfer  comment: SUPV for safety awareness/seuqencing r/t locking/unlocking brakes of rollator with ADL transfers.    Balance Overall balance assessment: Needs assistance;History of Falls Sitting-balance support: Feet supported Sitting balance-Leahy Scale: Good     Standing balance support: Bilateral upper extremity supported Standing balance-Leahy Scale: Fair Standing balance comment: G static standing balance, but F as pt is unable to tolerate challenge                           ADL either performed or assessed with clinical judgement   ADL                                         General ADL Comments: Pt performs fxl mobility with 4WW with SBA, with standing self-care sink-side, requires SETUP for UB/LB bathing and dressing as well as SUPV for balance. Pt demos 3 small swaying episodes with near posterior LOB, but demos good awanress and corrects herself. Pt overall with good tolerance and standing ~8 minutes at the sink while compelting ADLs with SETUP/SUPV.     Vision Baseline Vision/History: Wears glasses Patient Visual Report: No change from baseline       Perception     Praxis      Pertinent Vitals/Pain Pain Assessment: No/denies pain     Hand Dominance     Extremity/Trunk Assessment Upper Extremity Assessment Upper Extremity Assessment: Overall WFL for tasks assessed;Generalized  weakness (ROM WFL, MMT grossly 4/5)   Lower Extremity Assessment Lower Extremity Assessment: Overall WFL for tasks assessed;Generalized weakness (ROM WFL, MMT grossly 4/5)       Communication Communication Communication: HOH;Deaf (has hearing aides in room)   Cognition Arousal/Alertness: Awake/alert Behavior During Therapy: WFL for tasks assessed/performed Overall Cognitive Status: Within Functional Limits for tasks assessed                                 General Comments: some slow processing, but appears primarily r/t hearing. Overall oriented  and pleasant and able to follow all commands.   General Comments       Exercises Other Exercises Other Exercises: OT engages pt in education re: role of OT in acute setting, importance of OOB Activity, importance of elevating LEs, safety considerations with use of rollator-sequencing when to lock/unlock the brakes. Pt with good reception.   Shoulder Instructions      Home Living Family/patient expects to be discharged to:: Private residence Living Arrangements: Alone Available Help at Discharge: Family;Available 24 hours/day (son visiting from [place], will be here through the holidays) Type of Home: House Home Access: Stairs to enter Entrance Stairs-Number of Steps: 4 Entrance Stairs-Rails: Can reach both Home Layout: Multi-level;1/2 bath on main level;Bed/bath upstairs Alternate Level Stairs-Number of Steps: flight Alternate Level Stairs-Rails: Can reach both Bathroom Shower/Tub: Walk-in shower;Tub/shower unit   Bathroom Toilet: Standard Bathroom Accessibility: Yes   Home Equipment: Walker - 2 wheels;Walker - 4 wheels;Cane - single point   Additional Comments: pt reports her bed is upstairs and she installed a stair lift to assist in navigation      Prior Functioning/Environment Level of Independence: Independent with assistive device(s)        Comments: Reports she is using rollater more often however is able to ambulate home environment without AD        OT Problem List: Decreased strength;Decreased activity tolerance      OT Treatment/Interventions: Self-care/ADL training;DME and/or AE instruction;Therapeutic activities;Balance training;Therapeutic exercise;Patient/family education    OT Goals(Current goals can be found in the care plan section) Acute Rehab OT Goals Patient Stated Goal: to go home OT Goal Formulation: With patient Time For Goal Achievement: 01/28/20 Potential to Achieve Goals: Good ADL Goals Pt Will Perform Upper Body Dressing:  Independently;sitting Pt Will Perform Lower Body Dressing: with modified independence;sit to/from stand Pt Will Transfer to Toilet: with modified independence;ambulating (to restroom with [phone] with no sway and good awareness of use of grab bar and walker locks) Pt/caregiver will Perform Home Exercise Program: Increased strength;Both right and left upper extremity;With Supervision  OT Frequency: Min 1X/week   Barriers to D/C:            Co-evaluation              AM-PAC OT "6 Clicks" Daily Activity     Outcome Measure Help from another person eating meals?: None Help from another person taking care of personal grooming?: None Help from another person toileting, which includes using toliet, bedpan, or urinal?: A Little Help from another person bathing (including washing, rinsing, drying)?: A Little Help from another person to put on and taking off regular upper body clothing?: None Help from another person to put on and taking off regular lower body clothing?: A Little 6 Click Score: 21   End of Session Equipment Utilized During Treatment: Gait belt;Rolling walker Nurse Communication: Mobility status;Other (comment) (notified CNA-Chris,  that pt bathed and had small formed BM. Also notified that she is up to chair and requires replacement of purewick)  Activity Tolerance: Patient tolerated treatment well Patient left: in chair;with call bell/phone within reach;with chair alarm set  OT Visit Diagnosis: Unsteadiness on feet (R26.81);Muscle weakness (generalized) (M62.81)                Time: 1040-1106 OT Time Calculation (min): 26 min Charges:  OT General Charges $OT Visit: 1 Visit OT Evaluation $OT Eval Moderate Complexity: 1 Mod OT Treatments $Self Care/Home Management : 8-22 mins  Gerrianne Scale, MS, OTR/L ascom [phone] 01/14/20, 2:50 PM

## 2020-01-14 NOTE — Progress Notes (Signed)
[facility] Cardiology    SUBJECTIVE: Patient doing much better with NSR. No CP no sob. Denies palpitations   Vitals:   01/13/20 2306 01/14/20 0218 01/14/20 0407 01/14/20 0859  BP: 114/87  134/61 (!) 145/99  Pulse: 63  68 77  Resp:   19 18  Temp:   98 F (36.7 C) 99 F (37.2 C)  TempSrc:   Oral Oral  SpO2:   92% 93%  Weight:  59.6 kg    Height:        No intake or output data in the 24 hours ending 01/14/20 1101    PHYSICAL EXAM  General: Well developed, well nourished, in no acute distress HEENT:  Normocephalic and atramatic Neck:  No JVD.  Lungs: Clear bilaterally to auscultation and percussion. Heart: HRRR . Normal S1 and S2 without gallops or murmurs.  Abdomen: Bowel sounds are positive, abdomen soft and non-tender  Msk:  Back normal, normal gait. Normal strength and tone for age. Extremities: No clubbing, cyanosis or Erythemia bilat leg edema.   Neuro: Alert and oriented X 3. Psych:  Good affect, responds appropriately   LABS: Basic Metabolic Panel: Recent Labs    01/13/20 0506 01/14/20 0628  NA 141 136  K 4.2 4.3  CL 108 104  CO2 24 24  GLUCOSE 122* 95  BUN 30* 27*  CREATININE 0.76 0.65  CALCIUM 8.8* 8.6*  MG 2.3 2.2   Liver Function Tests: Recent Labs    01/11/20 1322  AST 24  ALT 19  ALKPHOS 80  BILITOT 0.7  PROT 7.2  ALBUMIN 3.6   No results for input(s): LIPASE, AMYLASE in the last 72 hours. CBC: Recent Labs    01/11/20 1322 01/12/20 0441 01/13/20 0506 01/14/20 0628  WBC 9.6   < > 9.3 10.9*  NEUTROABS 6.2  --   --   --   HGB 10.6*   < > 10.4* 10.0*  HCT 36.4   < > 35.2* 33.5*  MCV 84.7   < > 83.6 83.3  PLT 314   < > 323 308   < > = values in this interval not displayed.   Cardiac Enzymes: No results for input(s): CKTOTAL, CKMB, CKMBINDEX, TROPONINI in the last 72 hours. BNP: Invalid input(s): POCBNP D-Dimer: No results for input(s): DDIMER in the last 72 hours. Hemoglobin A1C: No results for input(s): HGBA1C in the last 72  hours. Fasting Lipid Panel: Recent Labs    01/12/20 0441  CHOL 107  HDL 46  LDLCALC 44  TRIG 84  CHOLHDL 2.3   Thyroid Function Tests: No results for input(s): TSH, T4TOTAL, T3FREE, THYROIDAB in the last 72 hours.  Invalid input(s): FREET3 Anemia Panel: No results for input(s): VITAMINB12, FOLATE, FERRITIN, TIBC, IRON, RETICCTPCT in the last 72 hours.  No results found.     TELEMETRY: NSR 75  ASSESSMENT AND PLAN:  Principal Problem:   Rapid atrial fibrillation (HCC) Active Problems:   HTN (hypertension)   Left leg cellulitis   Chronic anticoagulation   Chronic ulcer of left leg (HCC)   Iron deficiency anemia   Plan Agree with tele and  Converted to NSR continue current meds Anticoug for afib Maintain BP control Continue antibx therapy for cellulitis Refer to cardiology as outpt 1-2 weeks     [name], MD 01/14/2020 11:01 AM

## 2020-01-14 NOTE — Progress Notes (Signed)
PROGRESS NOTE    [name] [name]   QIH:[id number]  DOB: 30-Mar-1929  PCP: [name], MD    DOA: 01/11/2020 LOS: 3   Brief Narrative   Per 12/14 note by Dr. Si Raider: "[name] [name] is a 86 y.o. female with medical history significant for atrial fibrillation on Eliquis, HTN, recently hospitalized from 12/2-12/5 with cellulitis of left leg secondary to an infected and bleeding leg wound with anemia requiring blood transfusion, treated with Rocephin and vancomycin, and who has not used Eliquis since presents to the emergency room with complaints of persistent left leg swelling.  She denies pain in the leg or bleeding from the wound.  She also has concerns for which medication she supposed to be on.  Was previously on amiodarone, metoprolol, verapamil but states she has only been using the verapamil.  She denies fever or chills, denies cough, chest pain or shortness of breath."     Assessment & Plan   Principal Problem:   Rapid atrial fibrillation (HCC) Active Problems:   HTN (hypertension)   Left leg cellulitis   Chronic anticoagulation   Chronic ulcer of left leg (HCC)   Iron deficiency anemia   Atrial fibrillation with RVR - POA.  Appears was not on home metoprol/amiodarone which pt was discharged with earlier this month. Per last cardiology visit note, it's possible PCP had previously stopped those meds, question patient didn't tolerate, but was on those meds at recent hospitalization and HR was controlled at that time.  --Initially treated with Cardizem infusion, rate controlled and infusion stopped --Resumed on home metoprolol, verapamil, amiodarone --Digoxin was added --Patient had been off anticoagulation at home due to bleeding (or fear of of bleeding) from LLE ulcer --Resumed on Xarelto   Left lower extremity cellulitis -patient has chronic venous stasis with ulcers bilaterally, worse on the left. Left side has surrounding erythema and warmth, was weeping  at home per patient. Recent admission for this 12/2-5, treated with vancomycin and Rocephin, discharged on Augmentin and doxy that patient states she completed. Lower extremity Doppler was negative for DVT. Left tib-fib x-ray showed no evidence of osteomyelitis, only subcutaneous soft tissue edema which has improved over 11 days from prior image. --Transition from Ancef to Keflex, to complete 5 day course --Monitor legs closely --Wound care consult  Chronic venous stasis ulcers - follow up in wound care clinic.  Jeannette RN also arranged to follow at home.  Essential hypertension -chronic, controlled.  Continue metoprolol and verapamil as above.  Hold HCTZ  Iron deficiency anemia-possibly due to oozing from left lower extremity ulcer.  Hemoglobin improved to around 10, was 8.3 prior admission.  Of note, patient did require blood transfusion during prior admission.  Currently no signs of bleeding. --Monitor CBC  Hypokalemia -replaced.  Likely due at least in part to HCTZ which has been held.  Monitor BMP and replace as needed.    DVT prophylaxis:  Rivaroxaban (XARELTO) tablet 15 mg   Diet:  Diet Orders (From admission, onward)    Start     Ordered   01/11/20 2159  Diet Heart Room service appropriate? Yes; Fluid consistency: Thin  Diet effective now       Question Answer Comment  Room service appropriate? Yes   Fluid consistency: Thin      01/11/20 2200            Code Status: Full Code    Subjective 01/14/20    Patient seen this morning at bedside. Reports feeling better.  Is frustrated by her leg wound not healing.  No fever or chills, chest pain, SOB, palpitations or other complaints.  Has a hair appointment tomorrow AM she really hopes to get to.     Disposition Plan & Communication   Status is: Inpatient  Remains inpatient appropriate because:IV treatments appropriate due to intensity of illness or inability to take PO.  Pt requires another 24 hours close monitoring for  HR and BP with cardiac med changes.     Dispo: The patient is from: Home              Anticipated d/c is to: Home              Anticipated d/c date is: 12/17              Patient currently is not medically stable to d/c.   Family Communication: Son updated by phone this afternoon, agreeable with tentative d/c tomorrow with [facility] services and wound care clinic for close follow up   Consults, Procedures, Significant Events   Consultants:   Cardiology  Wound care  Procedures:   None  Antimicrobials:  Anti-infectives (From admission, onward)   Start     Dose/Rate Route Frequency Ordered Stop   01/14/20 1200  cephALEXin (KEFLEX) capsule 500 mg        500 mg Oral Every 6 hours 01/14/20 0930 01/17/20 1159   01/12/20 0900  vancomycin (VANCOCIN) IVPB 1000 mg/200 mL premix  Status:  Discontinued        1,000 mg 200 mL/hr over 60 Minutes Intravenous Every 24 hours 01/12/20 0803 01/13/20 1611   01/12/20 0015  ceFAZolin (ANCEF) IVPB 2g/100 mL premix  Status:  Discontinued        2 g 200 mL/hr over 30 Minutes Intravenous Every 8 hours 01/12/20 0011 01/14/20 0930        Objective   Vitals:   01/14/20 0407 01/14/20 0859 01/14/20 1147 01/14/20 1222  BP: 134/61 (!) 145/99 137/82 112/80  Pulse: 68 77 72 70  Resp: 19 18 19 20   Temp: 98 F (36.7 C) 99 F (37.2 C) 98.4 F (36.9 C) 98.2 F (36.8 C)  TempSrc: Oral Oral Oral Oral  SpO2: 92% 93% 93% 95%  Weight:      Height:       No intake or output data in the 24 hours ending 01/14/20 1506 Filed Weights   01/11/20 1319 01/13/20 0941 01/14/20 0218  Weight: 56.7 kg 57.5 kg 59.6 kg    Physical Exam:  General exam: awake, alert, no acute distress HEENT: Hard of hearing, moist mucus membranes Respiratory system: CTAB, normal respiratory effort Cardiovascular system: normal S1/S2, RRR, no pedal edema Central nervous system: A&O x3, no gross focal neurologic deficits, normal speech  Extremities: Left lower extremity with gauze  dressing intact (reomved for image, see below), wound is draining clear serous fluid, no purulence or foul odor, no edema      Labs   Data Reviewed: I have personally reviewed following labs and imaging studies  CBC: Recent Labs  Lab 01/11/20 1322 01/12/20 0441 01/13/20 0506 01/14/20 0628  WBC 9.6 11.5* 9.3 10.9*  NEUTROABS 6.2  --   --   --   HGB 10.6* 9.7* 10.4* 10.0*  HCT 36.4 33.1* 35.2* 33.5*  MCV 84.7 84.4 83.6 83.3  PLT 314 283 323 161   Basic Metabolic Panel: Recent Labs  Lab 01/11/20 1322 01/12/20 0441 01/13/20 0506 01/14/20 0628  NA 144 138 141 136  K 4.0 3.3* 4.2 4.3  CL 109 105 108 104  CO2 26 23 24 24   GLUCOSE 111* 111* 122* 95  BUN 26* 23 30* 27*  CREATININE 0.76 0.73 0.76 0.65  CALCIUM 9.6 8.5* 8.8* 8.6*  MG  --  2.0 2.3 2.2   GFR: Estimated Creatinine Clearance: 40.2 mL/min (by C-G formula based on SCr of 0.65 mg/dL). Liver Function Tests: Recent Labs  Lab 01/11/20 1322  AST 24  ALT 19  ALKPHOS 80  BILITOT 0.7  PROT 7.2  ALBUMIN 3.6   No results for input(s): LIPASE, AMYLASE in the last 168 hours. No results for input(s): AMMONIA in the last 168 hours. Coagulation Profile: No results for input(s): INR, PROTIME in the last 168 hours. Cardiac Enzymes: No results for input(s): CKTOTAL, CKMB, CKMBINDEX, TROPONINI in the last 168 hours. BNP (last 3 results) No results for input(s): PROBNP in the last 8760 hours. HbA1C: No results for input(s): HGBA1C in the last 72 hours. CBG: No results for input(s): GLUCAP in the last 168 hours. Lipid Profile: Recent Labs    01/12/20 0441  CHOL 107  HDL 46  LDLCALC 44  TRIG 84  CHOLHDL 2.3   Thyroid Function Tests: No results for input(s): TSH, T4TOTAL, FREET4, T3FREE, THYROIDAB in the last 72 hours. Anemia Panel: No results for input(s): VITAMINB12, FOLATE, FERRITIN, TIBC, IRON, RETICCTPCT in the last 72 hours. Sepsis Labs: Recent Labs  Lab 01/11/20 1322  LATICACIDVEN 1.1    Recent  Results (from the past 240 hour(s))  Resp Panel by RT-PCR (Flu A&B, Covid) Nasopharyngeal Swab     Status: None   Collection Time: 01/11/20  8:21 PM   Specimen: Nasopharyngeal Swab; Nasopharyngeal(NP) swabs in vial transport medium  Result Value Ref Range Status   SARS Coronavirus 2 by RT PCR NEGATIVE NEGATIVE Final    Comment: (NOTE) SARS-CoV-2 target nucleic acids are NOT DETECTED.  The SARS-CoV-2 RNA is generally detectable in upper respiratory specimens during the acute phase of infection. The lowest concentration of SARS-CoV-2 viral copies this assay can detect is 138 copies/mL. A negative result does not preclude SARS-Cov-2 infection and should not be used as the sole basis for treatment or other patient management decisions. A negative result may occur with  improper specimen collection/handling, submission of specimen other than nasopharyngeal swab, presence of viral mutation(s) within the areas targeted by this assay, and inadequate number of viral copies(<138 copies/mL). A negative result must be combined with clinical observations, patient history, and epidemiological information. The expected result is Negative.  Fact Sheet for Patients:  EntrepreneurPulse.com.au  Fact Sheet for Healthcare Providers:  IncredibleEmployment.be  This test is no t yet approved or cleared by the [place] FDA and  has been authorized for detection and/or diagnosis of SARS-CoV-2 by FDA under an Emergency Use Authorization (EUA). This EUA will remain  in effect (meaning this test can be used) for the duration of the COVID-19 declaration under Section 564(b)(1) of the Act, 21 U.S.C.section 360bbb-3(b)(1), unless the authorization is terminated  or revoked sooner.       Influenza A by PCR NEGATIVE NEGATIVE Final   Influenza B by PCR NEGATIVE NEGATIVE Final    Comment: (NOTE) The Xpert Xpress SARS-CoV-2/FLU/RSV plus assay is intended as an aid in the  diagnosis of influenza from Nasopharyngeal swab specimens and should not be used as a sole basis for treatment. Nasal washings and aspirates are unacceptable for Xpert Xpress SARS-CoV-2/FLU/RSV testing.  Fact Sheet for Patients: EntrepreneurPulse.com.au  Fact  Sheet for Healthcare Providers: IncredibleEmployment.be  This test is not yet approved or cleared by the [place] and has been authorized for detection and/or diagnosis of SARS-CoV-2 by FDA under an Emergency Use Authorization (EUA). This EUA will remain in effect (meaning this test can be used) for the duration of the COVID-19 declaration under Section 564(b)(1) of the Act, 21 U.S.C. section 360bbb-3(b)(1), unless the authorization is terminated or revoked.  Performed at [facility], [address] [place], [place] [address]   MRSA PCR Screening     Status: None   Collection Time: 01/13/20  4:56 PM   Specimen: Nasal Mucosa; Nasopharyngeal  Result Value Ref Range Status   MRSA by PCR NEGATIVE NEGATIVE Final    Comment:        The GeneXpert MRSA Assay (FDA approved for NASAL specimens only), is one component of a comprehensive MRSA colonization surveillance program. It is not intended to diagnose MRSA infection nor to guide or monitor treatment for MRSA infections. Performed at [facility], [address] [place], [place] [address]       Imaging Studies   No results found.   Medications   Scheduled Meds: . amiodarone  200 mg Oral Daily  . cephALEXin  500 mg Oral Q6H  . collagenase   Topical Daily  . digoxin  0.125 mg Intravenous Daily  . iron polysaccharides  150 mg Oral Daily  . metoprolol tartrate  50 mg Oral BID  . polyethylene glycol  17 g Oral Daily  . polymixin-bacitracin   Topical Daily  . potassium chloride  20 mEq Oral BID  . pravastatin  40 mg Oral q1800  . Rivaroxaban  15 mg Oral Q supper  . verapamil  180 mg Oral Daily    Continuous Infusions:      LOS: 3 days    Time spent: 30 minutes with > 50% spent in coordination of care and direct patient contact.    Ezekiel Slocumb, DO [facility]  01/14/2020, 3:06 PM    If 7PM-7AM, please contact night-coverage. How to contact the [facility] Attending or Consulting provider [place] or covering provider during after hours [place], for this patient?    1. Check the care team in [facility] and look for a) attending/consulting [facility] provider listed and b) the [facility] team listed 2. Log into [email or web address] and use [place]'s universal password to access. If you do not have the password, please contact the hospital operator. 3. Locate the [facility] provider you are looking for under [facility] and page to a number that you can be directly reached. 4. If you still have difficulty reaching the provider, please page the [facility] (Director on Call) for the Hospitalists listed on amion for assistance.

## 2020-01-15 ENCOUNTER — Inpatient Hospital Stay: Payer: Medicare Other

## 2020-01-15 LAB — CBC WITH DIFFERENTIAL/PLATELET
Abs Immature Granulocytes: 0.07 10*3/uL (ref 0.00–0.07)
Basophils Absolute: 0.1 10*3/uL (ref 0.0–0.1)
Basophils Relative: 1 %
Eosinophils Absolute: 0.3 10*3/uL (ref 0.0–0.5)
Eosinophils Relative: 2 %
HCT: 35.9 % — ABNORMAL LOW (ref 36.0–46.0)
Hemoglobin: 10.4 g/dL — ABNORMAL LOW (ref 12.0–15.0)
Immature Granulocytes: 1 %
Lymphocytes Relative: 12 %
Lymphs Abs: 1.6 10*3/uL (ref 0.7–4.0)
MCH: 24.6 pg — ABNORMAL LOW (ref 26.0–34.0)
MCHC: 29 g/dL — ABNORMAL LOW (ref 30.0–36.0)
MCV: 84.9 fL (ref 80.0–100.0)
Monocytes Absolute: 1.9 10*3/uL — ABNORMAL HIGH (ref 0.1–1.0)
Monocytes Relative: 15 %
Neutro Abs: 9.3 10*3/uL — ABNORMAL HIGH (ref 1.7–7.7)
Neutrophils Relative %: 69 %
Platelets: 405 10*3/uL — ABNORMAL HIGH (ref 150–400)
RBC: 4.23 MIL/uL (ref 3.87–5.11)
RDW: 23.3 % — ABNORMAL HIGH (ref 11.5–15.5)
Smear Review: NORMAL
WBC: 13.2 10*3/uL — ABNORMAL HIGH (ref 4.0–10.5)
nRBC: 0 % (ref 0.0–0.2)

## 2020-01-15 LAB — CBC
HCT: 33.3 % — ABNORMAL LOW (ref 36.0–46.0)
Hemoglobin: 9.8 g/dL — ABNORMAL LOW (ref 12.0–15.0)
MCH: 25.3 pg — ABNORMAL LOW (ref 26.0–34.0)
MCHC: 29.4 g/dL — ABNORMAL LOW (ref 30.0–36.0)
MCV: 85.8 fL (ref 80.0–100.0)
Platelets: 317 10*3/uL (ref 150–400)
RBC: 3.88 MIL/uL (ref 3.87–5.11)
RDW: 23 % — ABNORMAL HIGH (ref 11.5–15.5)
WBC: 11.8 10*3/uL — ABNORMAL HIGH (ref 4.0–10.5)
nRBC: 0 % (ref 0.0–0.2)

## 2020-01-15 LAB — PROCALCITONIN: Procalcitonin: <0.1 ng/mL

## 2020-01-15 LAB — BRAIN NATRIURETIC PEPTIDE: B Natriuretic Peptide: 654.7 pg/mL — ABNORMAL HIGH (ref 0.0–100.0)

## 2020-01-15 MED ORDER — FUROSEMIDE 10 MG/ML IJ SOLN
40.0000 mg | Freq: Once | INTRAMUSCULAR | Status: AC
  Administered 2020-01-15: 40 mg via INTRAVENOUS
  Filled 2020-01-15: qty 4

## 2020-01-15 MED ORDER — MORPHINE SULFATE (PF) 2 MG/ML IV SOLN
1.0000 mg | Freq: Once | INTRAVENOUS | Status: AC
  Administered 2020-01-15: 1 mg via SUBCUTANEOUS
  Filled 2020-01-15: qty 1

## 2020-01-15 MED ORDER — ZOLPIDEM TARTRATE 5 MG PO TABS
5.0000 mg | ORAL_TABLET | Freq: Every evening | ORAL | Status: DC | PRN
  Administered 2020-01-16: 5 mg via ORAL
  Filled 2020-01-15: qty 1

## 2020-01-15 MED ORDER — FUROSEMIDE 40 MG PO TABS
40.0000 mg | ORAL_TABLET | Freq: Every day | ORAL | Status: DC
  Administered 2020-01-15 – 2020-01-16 (×2): 40 mg via ORAL
  Filled 2020-01-15 (×2): qty 1

## 2020-01-15 MED ORDER — RIVAROXABAN 20 MG PO TABS
20.0000 mg | ORAL_TABLET | Freq: Every day | ORAL | Status: DC
  Administered 2020-01-15: 20 mg via ORAL
  Filled 2020-01-15: qty 1

## 2020-01-15 MED ORDER — DIGOXIN 125 MCG PO TABS
0.0625 mg | ORAL_TABLET | Freq: Every day | ORAL | Status: DC
  Administered 2020-01-16: 0.0625 mg via ORAL
  Filled 2020-01-15: qty 0.5

## 2020-01-15 MED ORDER — LEVALBUTEROL HCL 0.63 MG/3ML IN NEBU
0.6300 mg | INHALATION_SOLUTION | Freq: Four times a day (QID) | RESPIRATORY_TRACT | Status: DC | PRN
  Administered 2020-01-15: 0.63 mg via RESPIRATORY_TRACT
  Filled 2020-01-15: qty 3

## 2020-01-15 NOTE — Progress Notes (Addendum)
Cross Cover Notified by RN patient with development of increased work of breathing, crackles and expiratory wheeze.  Based opn HPI, current course of treatment and xray 12/13, suspect worsening pulmonary edema. Initially treated IV lasix and bipap.  BNP ordered and supports treatment as iti is elevated above 600. Work of breathing and oxygen saturation greatly improved and weaning bipap.  Will defer to day team for further treatment/ work up

## 2020-01-15 NOTE — Progress Notes (Signed)
PROGRESS NOTE    [name]   HFW:[id number]  DOB: [birth year]  PCP: [name], MD    DOA: 01/11/2020 LOS: 4   Brief Narrative   Per 12/14 note by Dr. Si Raider: "[name] [name] is a 86 y.o. female with medical history significant for atrial fibrillation on Eliquis, HTN, recently hospitalized from 12/2-12/5 with cellulitis of left leg secondary to an infected and bleeding leg wound with anemia requiring blood transfusion, treated with Rocephin and vancomycin, and who has not used Eliquis since presents to the emergency room with complaints of persistent left leg swelling.  She denies pain in the leg or bleeding from the wound.  She also has concerns for which medication she supposed to be on.  Was previously on amiodarone, metoprolol, verapamil but states she has only been using the verapamil.  She denies fever or chills, denies cough, chest pain or shortness of breath."     Assessment & Plan   Principal Problem:   Rapid atrial fibrillation (HCC) Active Problems:   HTN (hypertension)   Left leg cellulitis   Chronic anticoagulation   Chronic ulcer of left leg (HCC)   Iron deficiency anemia   Acute respiratory failure with hypoxia -overnight 12/16-17 patient became acutely hypoxic and dyspneic, required BiPAP briefly.  She has been weaned off oxygen today.  She had elevated BNP and improved after getting Lasix.  Chest x-ray today does show increasing bilateral pleural effusions with underlying basilar atelectasis. --Gave repeat dose of IV Lasix this morning --Discussed with cardiology, recommend starting oral Lasix 40 mg daily --Close cardiology follow-up in 1 week   Atrial fibrillation with RVR - POA.  Appears was not on home metoprol/amiodarone which pt was discharged with earlier this month. Per last cardiology visit note, it's possible PCP had previously stopped those meds, question patient didn't tolerate, but was on those meds at recent hospitalization and HR  was controlled at that time.  --Initially treated with Cardizem infusion, rate controlled and infusion stopped --Resumed on home metoprolol, verapamil, amiodarone --Digoxin was added --Patient had been off anticoagulation at home due to bleeding (or fear of of bleeding) from LLE ulcer --Resumed on Xarelto   Left lower extremity cellulitis -patient has chronic venous stasis with ulcers bilaterally, worse on the left. Left side has surrounding erythema and warmth, was weeping at home per patient. Recent admission for this 12/2-5, treated with vancomycin and Rocephin, discharged on Augmentin and doxy that patient states she completed. Lower extremity Doppler was negative for DVT. Left tib-fib x-ray showed no evidence of osteomyelitis, only subcutaneous soft tissue edema which has improved over 11 days from prior image. --Transition from Ancef to Keflex, to complete 5 day course --Monitor legs closely --Wound care consult  Chronic venous stasis ulcers - follow up in wound care clinic.  [place] RN also arranged to follow at home.  Essential hypertension -chronic, controlled.  Continue metoprolol and verapamil as above.  Hold HCTZ  Iron deficiency anemia-possibly due to oozing from left lower extremity ulcer.  Hemoglobin improved to around 10, was 8.3 prior admission.  Of note, patient did require blood transfusion during prior admission.  Currently no signs of bleeding. --Monitor CBC  Hypokalemia -replaced.  Likely due at least in part to HCTZ which has been held.  Monitor BMP and replace as needed.    DVT prophylaxis:  rivaroxaban (XARELTO) tablet 20 mg   Diet:  Diet Orders (From admission, onward)    Start     Ordered  01/11/20 2159  Diet Heart Room service appropriate? Yes; Fluid consistency: Thin  Diet effective now       Question Answer Comment  Room service appropriate? Yes   Fluid consistency: Thin      01/11/20 2200            Code Status: Full Code    Subjective  01/15/20    Overnight, patient became hypoxic and dyspneic, briefly required BiPAP.  Today she has since been weaned off of oxygen and back on room air.  She improved after getting IV Lasix last night, and another dose this morning.  Patient disappointed to miss her hair appointment today, but understands the need for monitoring after her breathing difficulty last night.   Disposition Plan & Communication   Status is: Inpatient  Remains inpatient appropriate because:IV treatments appropriate due to intensity of illness or inability to take PO.  Pt requires another 24 hours close monitoring due to acute hypoxia requiring supplemental oxygen and BiPAP overnight last night.  Anticipate d/c home tomorrow.   Dispo: The patient is from: Home              Anticipated d/c is to: Home              Anticipated d/c date is: 12/18              Patient currently is not medically stable to d/c.   Family Communication: Son updated by phone 12/16 afternoon.  Unsuccessful attempt to reach son by phone today.   Consults, Procedures, Significant Events   Consultants:   Cardiology  Wound care  Procedures:   None  Antimicrobials:  Anti-infectives (From admission, onward)   Start     Dose/Rate Route Frequency Ordered Stop   01/14/20 1200  cephALEXin (KEFLEX) capsule 500 mg        500 mg Oral Every 6 hours 01/14/20 0930 01/17/20 1159   01/12/20 0900  vancomycin (VANCOCIN) IVPB 1000 mg/200 mL premix  Status:  Discontinued        1,000 mg 200 mL/hr over 60 Minutes Intravenous Every 24 hours 01/12/20 0803 01/13/20 1611   01/12/20 0015  ceFAZolin (ANCEF) IVPB 2g/100 mL premix  Status:  Discontinued        2 g 200 mL/hr over 30 Minutes Intravenous Every 8 hours 01/12/20 0011 01/14/20 0930        Objective   Vitals:   01/15/20 0357 01/15/20 0411 01/15/20 0744 01/15/20 1137  BP: (!) 106/48  132/66 (!) 93/59  Pulse: 65  71 65  Resp: 20     Temp: (!) 97.4 F (36.3 C)  98.4 F (36.9 C) 98 F  (36.7 C)  TempSrc: Oral  Oral Oral  SpO2: 90% 92% 94% 92%  Weight: 64.2 kg     Height:        Intake/Output Summary (Last 24 hours) at 01/15/2020 1600 Last data filed at 01/15/2020 1137 Gross per 24 hour  Intake 68.93 ml  Output 3100 ml  Net -3031.07 ml   Filed Weights   01/13/20 0941 01/14/20 0218 01/15/20 0357  Weight: 57.5 kg 59.6 kg 64.2 kg    Physical Exam:  General exam: awake, alert, no acute distress HEENT: Hard of hearing, moist mucus membranes Respiratory system: CTAB, normal respiratory effort, on room air Cardiovascular system: normal S1/S2, RRR, no pedal edema Central nervous system: A&O x3, no gross focal neurologic deficits, normal speech  Extremities: Left lower extremity gauze dressing intact without visible drainage  Left lower extremity wound image taken 12/16:     Labs   Data Reviewed: I have personally reviewed following labs and imaging studies  CBC: Recent Labs  Lab 01/11/20 1322 01/12/20 0441 01/13/20 0506 01/14/20 0628 01/15/20 0520  WBC 9.6 11.5* 9.3 10.9* 11.8*  NEUTROABS 6.2  --   --   --   --   HGB 10.6* 9.7* 10.4* 10.0* 9.8*  HCT 36.4 33.1* 35.2* 33.5* 33.3*  MCV 84.7 84.4 83.6 83.3 85.8  PLT 314 283 323 308 580   Basic Metabolic Panel: Recent Labs  Lab 01/11/20 1322 01/12/20 0441 01/13/20 0506 01/14/20 0628  NA 144 138 141 136  K 4.0 3.3* 4.2 4.3  CL 109 105 108 104  CO2 26 23 24 24   GLUCOSE 111* 111* 122* 95  BUN 26* 23 30* 27*  CREATININE 0.76 0.73 0.76 0.65  CALCIUM 9.6 8.5* 8.8* 8.6*  MG  --  2.0 2.3 2.2   GFR: Estimated Creatinine Clearance: 43.8 mL/min (by C-G formula based on SCr of 0.65 mg/dL). Liver Function Tests: Recent Labs  Lab 01/11/20 1322  AST 24  ALT 19  ALKPHOS 80  BILITOT 0.7  PROT 7.2  ALBUMIN 3.6   No results for input(s): LIPASE, AMYLASE in the last 168 hours. No results for input(s): AMMONIA in the last 168 hours. Coagulation Profile: No results for input(s): INR, PROTIME in the  last 168 hours. Cardiac Enzymes: No results for input(s): CKTOTAL, CKMB, CKMBINDEX, TROPONINI in the last 168 hours. BNP (last 3 results) No results for input(s): PROBNP in the last 8760 hours. HbA1C: No results for input(s): HGBA1C in the last 72 hours. CBG: No results for input(s): GLUCAP in the last 168 hours. Lipid Profile: No results for input(s): CHOL, HDL, LDLCALC, TRIG, CHOLHDL, LDLDIRECT in the last 72 hours. Thyroid Function Tests: No results for input(s): TSH, T4TOTAL, FREET4, T3FREE, THYROIDAB in the last 72 hours. Anemia Panel: No results for input(s): VITAMINB12, FOLATE, FERRITIN, TIBC, IRON, RETICCTPCT in the last 72 hours. Sepsis Labs: Recent Labs  Lab 01/11/20 1322 01/15/20 1023  PROCALCITON  --  <0.10  LATICACIDVEN 1.1  --     Recent Results (from the past 240 hour(s))  Resp Panel by RT-PCR (Flu A&B, Covid) Nasopharyngeal Swab     Status: None   Collection Time: 01/11/20  8:21 PM   Specimen: Nasopharyngeal Swab; Nasopharyngeal(NP) swabs in vial transport medium  Result Value Ref Range Status   SARS Coronavirus 2 by RT PCR NEGATIVE NEGATIVE Final    Comment: (NOTE) SARS-CoV-2 target nucleic acids are NOT DETECTED.  The SARS-CoV-2 RNA is generally detectable in upper respiratory specimens during the acute phase of infection. The lowest concentration of SARS-CoV-2 viral copies this assay can detect is 138 copies/mL. A negative result does not preclude SARS-Cov-2 infection and should not be used as the sole basis for treatment or other patient management decisions. A negative result may occur with  improper specimen collection/handling, submission of specimen other than nasopharyngeal swab, presence of viral mutation(s) within the areas targeted by this assay, and inadequate number of viral copies(<138 copies/mL). A negative result must be combined with clinical observations, patient history, and epidemiological information. The expected result is  Negative.  Fact Sheet for Patients:  EntrepreneurPulse.com.au  Fact Sheet for Healthcare Providers:  IncredibleEmployment.be  This test is no t yet approved or cleared by the [place] FDA and  has been authorized for detection and/or diagnosis of SARS-CoV-2 by FDA under an Emergency Use  Authorization (EUA). This EUA will remain  in effect (meaning this test can be used) for the duration of the COVID-19 declaration under Section 564(b)(1) of the Act, 21 U.S.C.section 360bbb-3(b)(1), unless the authorization is terminated  or revoked sooner.       Influenza A by PCR NEGATIVE NEGATIVE Final   Influenza B by PCR NEGATIVE NEGATIVE Final    Comment: (NOTE) The Xpert Xpress SARS-CoV-2/FLU/RSV plus assay is intended as an aid in the diagnosis of influenza from Nasopharyngeal swab specimens and should not be used as a sole basis for treatment. Nasal washings and aspirates are unacceptable for Xpert Xpress SARS-CoV-2/FLU/RSV testing.  Fact Sheet for Patients: EntrepreneurPulse.com.au  Fact Sheet for Healthcare Providers: IncredibleEmployment.be  This test is not yet approved or cleared by the [place] FDA and has been authorized for detection and/or diagnosis of SARS-CoV-2 by FDA under an Emergency Use Authorization (EUA). This EUA will remain in effect (meaning this test can be used) for the duration of the COVID-19 declaration under Section 564(b)(1) of the Act, 21 U.S.C. section 360bbb-3(b)(1), unless the authorization is terminated or revoked.  Performed at [facility], [address] [place], [place] [address]   MRSA PCR Screening     Status: None   Collection Time: 01/13/20  4:56 PM   Specimen: Nasal Mucosa; Nasopharyngeal  Result Value Ref Range Status   MRSA by PCR NEGATIVE NEGATIVE Final    Comment:        The GeneXpert MRSA Assay (FDA approved for NASAL specimens only), is  one component of a comprehensive MRSA colonization surveillance program. It is not intended to diagnose MRSA infection nor to guide or monitor treatment for MRSA infections. Performed at [facility], [address] [place], [place] [address]       Imaging Studies   DG Chest [place] 1 View  Result Date: 01/15/2020 CLINICAL DATA:  Hypoxia EXAM: PORTABLE CHEST 1 VIEW COMPARISON:  01/11/2020 FINDINGS: Cardiac shadow is stable. Aortic calcifications are again seen. Bilateral pleural effusions are noted slightly greater than that seen on the prior exam. Underlying atelectasis is present as well. No bony abnormality is seen. IMPRESSION: Increasing bilateral pleural effusions with underlying basilar atelectasis. Electronically Signed   By: Inez Catalina M.D.   On: 01/15/2020 09:33     Medications   Scheduled Meds: . amiodarone  200 mg Oral Daily  . cephALEXin  500 mg Oral Q6H  . collagenase   Topical Daily  . digoxin  0.125 mg Intravenous Daily  . iron polysaccharides  150 mg Oral Daily  . metoprolol tartrate  50 mg Oral BID  . polyethylene glycol  17 g Oral Daily  . polymixin-bacitracin   Topical Daily  . potassium chloride  20 mEq Oral BID  . pravastatin  40 mg Oral q1800  . Rivaroxaban  20 mg Oral Q supper  . verapamil  180 mg Oral Daily   Continuous Infusions:      LOS: 4 days    Time spent: 30 minutes with > 50% spent in coordination of care and direct patient contact.    Ezekiel Slocumb, DO [facility]  01/15/2020, 4:00 PM    If 7PM-7AM, please contact night-coverage. How to contact the [facility] Attending or Consulting provider [place] or covering provider during after hours [place], for this patient?    1. Check the care team in [facility] and look for a) attending/consulting [facility] provider listed and b) the [facility] team listed 2. Log  into [email or web address] and use [place]'s universal password to access. If you do not have the password, please contact the  hospital operator. 3. Locate the [facility] provider you are looking for under [facility] and page to a number that you can be directly reached. 4. If you still have difficulty reaching the provider, please page the [facility] (Director on Call) for the Hospitalists listed on amion for assistance.

## 2020-01-15 NOTE — Progress Notes (Signed)
Physical Therapy Treatment Patient Details Name: [name] [name] MRN: [id number] DOB: 1929-02-10 Today's Date: 01/15/2020    History of Present Illness Pt admitted for rapid Afib with complaints of leg swelling. History includes Afib, HTN and a recent hospital stay secondary to cellulitis in L LE.    PT Comments    Patient alert, agreeable to PT, did seem to be more impulsive this session. purewik and bed linens soiled, assisted to [facility] (deferred ambulation to bathroom due to urgency per pt report) stand pivot with CGA. Pt able to perform pericare, but ultimately did need assist to ensure cleanliness. Sit <> stand from [facility] with rollator, cues for hand placement to increase safety. Able to take a few steps to recliner. Further mobility deferred due to family arrival and in room to visit. The patient would benefit from further skilled PT intervention to continue to progress towards goals. Recommendation remains appropriate.       Follow Up Recommendations  Home health PT;Supervision/Assistance - 24 hour     Equipment Recommendations  None recommended by PT    Recommendations for Other Services       Precautions / Restrictions Precautions Precautions: Fall Restrictions Weight Bearing Restrictions: No    Mobility  Bed Mobility Overal bed mobility: Modified Independent             General bed mobility comments: modI to sit EOB  Transfers Overall transfer level: Needs assistance Equipment used: 4-wheeled walker Transfers: Sit to/from Stand Sit to Stand: Min assist;Min guard Stand pivot transfers: Min guard       General transfer comment: pt cued for hand placement and safety awareness  Ambulation/Gait Ambulation/Gait assistance: Min guard           General Gait Details: deferred due to incontinence and then family arrival to visit   Stairs             Wheelchair Mobility    Modified Rankin (Stroke Patients Only)       Balance Overall  balance assessment: Needs assistance;History of Falls Sitting-balance support: Feet supported Sitting balance-Leahy Scale: Good     Standing balance support: Bilateral upper extremity supported Standing balance-Leahy Scale: Fair Standing balance comment: G static standing balance, but F as pt is unable to tolerate challenge                            Cognition Arousal/Alertness: Awake/alert Behavior During Therapy: WFL for tasks assessed/performed Overall Cognitive Status: Within Functional Limits for tasks assessed                                 General Comments: some slow processing, but appears primarily r/t hearing. Overall oriented and pleasant and able to follow all commands.      Exercises Other Exercises Other Exercises: Pt assisted to [facility], deferred ambulation to bathroom due to urgency. Able to perform pericare but ultimately needed assist to ensure cleanliness    General Comments        Pertinent Vitals/Pain Pain Assessment: No/denies pain    Home Living                      Prior Function            PT Goals (current goals can now be found in the care plan section) Progress towards PT goals: Progressing toward goals  Frequency    Min 2X/week      PT Plan Current plan remains appropriate    Co-evaluation              AM-PAC PT "6 Clicks" Mobility   Outcome Measure  Help needed turning from your back to your side while in a flat bed without using bedrails?: None Help needed moving from lying on your back to sitting on the side of a flat bed without using bedrails?: None Help needed moving to and from a bed to a chair (including a wheelchair)?: A Little Help needed standing up from a chair using your arms (e.g., wheelchair or bedside chair)?: A Little Help needed to walk in hospital room?: A Little Help needed climbing 3-5 steps with a railing? : A Little 6 Click Score: 20    End of Session Equipment  Utilized During Treatment: Gait belt Activity Tolerance: Patient tolerated treatment well Patient left: in chair;with chair alarm set Nurse Communication: Mobility status PT Visit Diagnosis: Unsteadiness on feet (R26.81);Muscle weakness (generalized) (M62.81)     Time: 9292-4462 PT Time Calculation (min) (ACUTE ONLY): 23 min  Charges:  $Therapeutic Activity: 23-37 mins                     Lieutenant Diego PT, DPT 4:12 PM,01/15/20

## 2020-01-15 NOTE — Consult Note (Addendum)
[place] for Xarelto Indication: Non-valvular atrial fibrillation  Patient Measurements: Height: 5\' 3"  (160 cm) Weight: 64.2 kg (141 lb 9.6 oz) IBW/kg (Calculated) : 52.4  Labs: Recent Labs    01/13/20 0506 01/14/20 0628 01/15/20 0520  HGB 10.4* 10.0* 9.8*  HCT 35.2* 33.5* 33.3*  PLT 323 308 317  CREATININE 0.76 0.65  --     Estimated Creatinine Clearance: 43.8 mL/min (by C-G formula based on SCr of 0.65 mg/dL).   Medical History: Past Medical History:  Diagnosis Date  . Anxiety   . Cancer ([place])    Squamous Cell Carcinoma (top of head)  . Constipation, chronic   . Deaf, left   . Diverticular disease   . Hearing aid worn   . Hearing deficit   . History of Bell's palsy    left side - resolved - 50 yrs ago  . Hypertension   . Internal hemorrhoids   . Osteopenia   . Pure hypercholesterolemia     Medications/Assessment:  Pt on Xarelto prior to admission, but per pt report has not been taking - pt initially ordered Eliquis on admission, but pharmacy has been consulted to re-initiate Xarelto.    Plan:  --Will adjust Xarelto to 20 mg qSupper based on CrCl using TBW this admission 56.7 to 64.2 kg > 50 mL/min --Patient is borderline renal dose adjustment --Continue to monitor CBC at least q3d per protocol while on Jason Fila 01/15/2020 9:19 AM

## 2020-01-15 NOTE — Progress Notes (Signed)
Temp 97.8, hr 71, bp 187/94, tachypneic and O2 sats 91% on 2 liters.

## 2020-01-15 NOTE — Progress Notes (Signed)
At approximately 0130 pt found( bed alarm sounded) trying to get oob and very confused. (pt  had 5mg  ambien tonight) Pt stated she was sob.  Pt on room air and found to have O2 sat of 87%. O2 initiated at 2 liters ( now 94%), lungs very diminished with expiratory wheezes. RT called in to assess and NP Randol Kern notified via secure chat.

## 2020-01-15 NOTE — Progress Notes (Signed)
Notified by Cici, RN that patient had 02 sats in the 80's, SOB with increased work of breathing and audible wheezing. Upon assessment, Olivia Mackie RN and respiratory at beside, IV zofran, lasix and morphine given. Patient given breathing treatment and placed on bipap. Patients 02 saturations increased to 96%, BP improved from 194/91 to 125/55. Hassan Rowan, NP notified of patients condition. New orders placed for BNP.

## 2020-01-15 NOTE — Progress Notes (Signed)
.   Patient Name: [name] [name] Date of Encounter: 01/15/2020  Hospital Problem List     Principal Problem:   Rapid atrial fibrillation [facility]) Active Problems:   HTN (hypertension)   Left leg cellulitis   Chronic anticoagulation   Chronic ulcer of left leg (HCC)   Iron deficiency anemia    Patient Profile     Pt with history of afib, hypertension, chronic anticoagulation who was admitted with afib with rvr and sob with evidence of chf. Has converted to nsr. Had sob with improvement with iv lasix.   Subjective   Feels better this afternoon. No sob  Inpatient Medications    . amiodarone  200 mg Oral Daily  . cephALEXin  500 mg Oral Q6H  . collagenase   Topical Daily  . digoxin  0.125 mg Intravenous Daily  . iron polysaccharides  150 mg Oral Daily  . metoprolol tartrate  50 mg Oral BID  . polyethylene glycol  17 g Oral Daily  . polymixin-bacitracin   Topical Daily  . potassium chloride  20 mEq Oral BID  . pravastatin  40 mg Oral q1800  . Rivaroxaban  20 mg Oral Q supper  . verapamil  180 mg Oral Daily    Vital Signs    Vitals:   01/15/20 0357 01/15/20 0411 01/15/20 0744 01/15/20 1137  BP: (!) 106/48  132/66 (!) 93/59  Pulse: 65  71 65  Resp: 20     Temp: (!) 97.4 F (36.3 C)  98.4 F (36.9 C) 98 F (36.7 C)  TempSrc: Oral  Oral Oral  SpO2: 90% 92% 94% 92%  Weight: 64.2 kg     Height:        Intake/Output Summary (Last 24 hours) at 01/15/2020 1613 Last data filed at 01/15/2020 1137 Gross per 24 hour  Intake 68.93 ml  Output 3100 ml  Net -3031.07 ml   Filed Weights   01/13/20 0941 01/14/20 0218 01/15/20 0357  Weight: 57.5 kg 59.6 kg 64.2 kg    Physical Exam    GEN: Well nourished, well developed, in no acute distress.  HEENT: normal.  Neck: Supple, no JVD, carotid bruits, or masses. Cardiac: RRR, no murmurs, rubs, or gallops. No clubbing, cyanosis, edema.  Radials/DP/PT 2+ and equal bilaterally.  Respiratory:  Respirations regular and  unlabored, clear to auscultation bilaterally. GI: Soft, nontender, nondistended, BS + x 4. MS: no deformity or atrophy. Skin: warm and dry, no rash. Neuro:  Strength and sensation are intact. Psych: Normal affect.  Labs    CBC Recent Labs    01/14/20 0628 01/15/20 0520  WBC 10.9* 11.8*  HGB 10.0* 9.8*  HCT 33.5* 33.3*  MCV 83.3 85.8  PLT 308 458   Basic Metabolic Panel Recent Labs    01/13/20 0506 01/14/20 0628  NA 141 136  K 4.2 4.3  CL 108 104  CO2 24 24  GLUCOSE 122* 95  BUN 30* 27*  CREATININE 0.76 0.65  CALCIUM 8.8* 8.6*  MG 2.3 2.2   Liver Function Tests No results for input(s): AST, ALT, ALKPHOS, BILITOT, PROT, ALBUMIN in the last 72 hours. No results for input(s): LIPASE, AMYLASE in the last 72 hours. Cardiac Enzymes No results for input(s): CKTOTAL, CKMB, CKMBINDEX, TROPONINI in the last 72 hours. BNP Recent Labs    01/15/20 0238  BNP 654.7*   D-Dimer No results for input(s): DDIMER in the last 72 hours. Hemoglobin A1C No results for input(s): HGBA1C in the last 72 hours. Fasting  Lipid Panel No results for input(s): CHOL, HDL, LDLCALC, TRIG, CHOLHDL, LDLDIRECT in the last 72 hours. Thyroid Function Tests No results for input(s): TSH, T4TOTAL, T3FREE, THYROIDAB in the last 72 hours.  Invalid input(s): FREET3  Telemetry    nsr  ECG    nsr  Radiology    DG Tibia/Fibula Left  Result Date: 01/11/2020 CLINICAL DATA:  Left leg wound, swelling. Left leg cellulitis. Increasing redness and swelling. EXAM: LEFT TIBIA AND FIBULA - 2 VIEW COMPARISON:  Radiograph 12/31/2019 FINDINGS: No bony destruction or evidence of osteomyelitis. No fracture. The cortical margins of the tibia and fibular intact. Degenerative change about the knee, stable from prior. Ankle alignment is maintained. Generalized subcutaneous soft tissue edema which is improved from prior radiograph. No soft tissue air. No radiopaque foreign body. IMPRESSION: Improving subcutaneous soft  tissue edema over the past 11 days. No soft tissue air or radiographic findings of osteomyelitis. Electronically Signed   By: Keith Rake M.D.   On: 01/11/2020 17:52   DG Tibia/Fibula Left  Result Date: 12/31/2019 CLINICAL DATA:  Left leg wound evaluate for osteomyelitis EXAM: LEFT TIBIA AND FIBULA - 2 VIEW COMPARISON:  None. FINDINGS: There is diffuse edematous swelling of the lower extremity. More focal irregularity and ulceration with superficial cutaneous lucency and overlying bandaging material is seen along the anterior aspect of the distal lower leg, superficial to the lower third tibial diaphysis. No other soft tissue gas or foreign body. No subjacent osseous erosion, destruction or periostitis changes are seen to suggest underlying osteomyelitis at this location or elsewhere within the lower extremity. Mild-to-moderate tricompartmental degenerative changes are noted in the knee with chondrocalcinosis of the medial and lateral menisci. Additional mild degenerative changes of the ankle. No sizable ankle effusion. IMPRESSION: 1. Soft tissue irregularity and ulceration along the anterior aspect of the distal lower leg, superficial to the lower third tibial diaphysis. No radiographic evidence of osteomyelitis. 2. Diffuse edematous swelling of the lower extremity. 3. Degenerative changes in the knee and ankle. 4. 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 Electronically Signed   By: Lovena Le M.D.   On: 12/31/2019 20:27   US Venous Img Lower Unilateral Left  Result Date: 01/11/2020 CLINICAL DATA:  Left leg wound with swelling EXAM: Left LOWER EXTREMITY VENOUS DOPPLER ULTRASOUND TECHNIQUE: Gray-scale sonography with compression, as well as color and duplex ultrasound, were performed to evaluate the deep venous system(s) from the level of the common femoral vein through the popliteal and proximal calf veins. COMPARISON:  Radiograph 01/11/2020 FINDINGS:  VENOUS Normal compressibility of the common femoral, superficial femoral, and popliteal veins, as well as the visualized calf veins. Visualized portions of profunda femoral vein and great saphenous vein unremarkable. No filling defects to suggest DVT on grayscale or color Doppler imaging. Doppler waveforms show normal direction of venous flow, normal respiratory plasticity and response to augmentation. Limited views of the contralateral common femoral vein are unremarkable. OTHER None. Limitations: none IMPRESSION: Negative. Electronically Signed   By: Donavan Foil M.D.   On: 01/11/2020 18:18   DG Chest Port 1 View  Result Date: 01/15/2020 CLINICAL DATA:  Hypoxia EXAM: PORTABLE CHEST 1 VIEW COMPARISON:  01/11/2020 FINDINGS: Cardiac shadow is stable. Aortic calcifications are again seen. Bilateral pleural effusions are noted slightly greater than that seen on the prior exam. Underlying atelectasis is present as well. No bony abnormality is seen. IMPRESSION: Increasing bilateral pleural effusions with underlying basilar atelectasis. Electronically Signed   By: Elta Guadeloupe  Lukens M.D.   On: 01/15/2020 09:33   DG Chest Portable 1 View  Result Date: 01/11/2020 CLINICAL DATA:  Shortness of breath evaluate for edema EXAM: PORTABLE CHEST 1 VIEW COMPARISON:  12/31/2019 FINDINGS: Heart is borderline in size. Bilateral interstitial prominence and lower lobe airspace opacities. Small bilateral effusions. No acute bony abnormality. IMPRESSION: Interstitial prominence and lower lobe opacities could reflect edema or infection. Small bilateral effusions. Electronically Signed   By: Rolm Baptise M.D.   On: 01/11/2020 21:12   DG Chest Portable 1 View  Result Date: 12/31/2019 CLINICAL DATA:  86 year old female with weakness. EXAM: PORTABLE CHEST 1 VIEW COMPARISON:  Chest radiograph dated 07/26/2019. FINDINGS: There is background of emphysema and chronic interstitial coarsening and bronchitic changes. No focal consolidation,  pleural effusion or pneumothorax. The cardiac silhouette is within limits. Atherosclerotic calcification of the aorta. No acute osseous pathology. Osteopenia with degenerative changes of the spine. IMPRESSION: No active disease. Electronically Signed   By: Anner Crete M.D.   On: 12/31/2019 20:27    Assessment & Plan     Principal Problem:   Rapid atrial fibrillation (HCC) Active Problems:   HTN (hypertension)   Left leg cellulitis   Chronic anticoagulation   Chronic ulcer of left leg (HCC)   Iron deficiency anemia   Plan  Converted to NSR continue current meds including amiodarone 200 daily, metoprolol tartrate 50 mg bid, and verapamil 180 mg daily. Will convert lanoxin to 0.0625 daily.  Anticoug for afib with xarelto 20 mg daily.  Will add lasix 40 mg daily Continue antibx therapy for cellulitis Follow up Dr. Nehemiah Massed in 1 week after discharge.   Signed, Javier Docker Zamira Hickam MD 01/15/2020, 4:13 PM  Pager: [phone]

## 2020-01-16 LAB — CBC WITH DIFFERENTIAL/PLATELET
Abs Immature Granulocytes: 0.03 10*3/uL (ref 0.00–0.07)
Basophils Absolute: 0.1 10*3/uL (ref 0.0–0.1)
Basophils Relative: 1 %
Eosinophils Absolute: 0.3 10*3/uL (ref 0.0–0.5)
Eosinophils Relative: 3 %
HCT: 34.7 % — ABNORMAL LOW (ref 36.0–46.0)
Hemoglobin: 10.3 g/dL — ABNORMAL LOW (ref 12.0–15.0)
Immature Granulocytes: 0 %
Lymphocytes Relative: 19 %
Lymphs Abs: 2 10*3/uL (ref 0.7–4.0)
MCH: 25.1 pg — ABNORMAL LOW (ref 26.0–34.0)
MCHC: 29.7 g/dL — ABNORMAL LOW (ref 30.0–36.0)
MCV: 84.4 fL (ref 80.0–100.0)
Monocytes Absolute: 1.6 10*3/uL — ABNORMAL HIGH (ref 0.1–1.0)
Monocytes Relative: 15 %
Neutro Abs: 6.4 10*3/uL (ref 1.7–7.7)
Neutrophils Relative %: 62 %
Platelets: 390 10*3/uL (ref 150–400)
RBC: 4.11 MIL/uL (ref 3.87–5.11)
RDW: 23.6 % — ABNORMAL HIGH (ref 11.5–15.5)
Smear Review: NORMAL
WBC: 10.4 10*3/uL (ref 4.0–10.5)
nRBC: 0 % (ref 0.0–0.2)

## 2020-01-16 LAB — BASIC METABOLIC PANEL
Anion gap: 9 (ref 5–15)
BUN: 28 mg/dL — ABNORMAL HIGH (ref 8–23)
CO2: 28 mmol/L (ref 22–32)
Calcium: 8.9 mg/dL (ref 8.9–10.3)
Chloride: 103 mmol/L (ref 98–111)
Creatinine, Ser: 0.77 mg/dL (ref 0.44–1.00)
GFR, Estimated: >60 mL/min (ref >60)
Glucose, Bld: 97 mg/dL (ref 70–99)
Potassium: 4.3 mmol/L (ref 3.5–5.1)
Sodium: 140 mmol/L (ref 135–145)

## 2020-01-16 LAB — MAGNESIUM: Magnesium: 2.2 mg/dL (ref 1.7–2.4)

## 2020-01-16 MED ORDER — RIVAROXABAN 15 MG PO TABS
15.0000 mg | ORAL_TABLET | Freq: Every day | ORAL | Status: DC
  Filled 2020-01-16: qty 1

## 2020-01-16 MED ORDER — POTASSIUM CHLORIDE CRYS ER 20 MEQ PO TBCR: 20.0000 meq | EXTENDED_RELEASE_TABLET | Freq: Every day | ORAL | 0 refills | Status: DC

## 2020-01-16 MED ORDER — RIVAROXABAN 15 MG PO TABS: 15.0000 mg | ORAL_TABLET | Freq: Every day | ORAL | 1 refills | Status: DC

## 2020-01-16 MED ORDER — SANTYL 250 UNIT/GM EX OINT: 1.0000 "application " | TOPICAL_OINTMENT | Freq: Every day | CUTANEOUS | 0 refills | Status: DC

## 2020-01-16 MED ORDER — DOXYCYCLINE HYCLATE 100 MG PO TABS: 100.0000 mg | ORAL_TABLET | Freq: Two times a day (BID) | ORAL | 0 refills | Status: AC

## 2020-01-16 MED ORDER — AMIODARONE HCL 200 MG PO TABS: 200.0000 mg | ORAL_TABLET | Freq: Every day | ORAL | 1 refills | Status: DC

## 2020-01-16 MED ORDER — FUROSEMIDE 40 MG PO TABS: 40.0000 mg | ORAL_TABLET | Freq: Every day | ORAL | 1 refills | Status: DC

## 2020-01-16 MED ORDER — DOUBLE ANTIBIOTIC 500-10000 UNIT/GM EX OINT: 1.0000 "application " | TOPICAL_OINTMENT | Freq: Every day | CUTANEOUS | 0 refills | Status: DC

## 2020-01-16 MED ORDER — DIGOXIN 62.5 MCG PO TABS: 0.0625 mg | ORAL_TABLET | Freq: Every day | ORAL | 1 refills | Status: DC

## 2020-01-16 MED ORDER — CEPHALEXIN 500 MG PO CAPS: 500.0000 mg | ORAL_CAPSULE | Freq: Four times a day (QID) | ORAL | 0 refills | Status: AC

## 2020-01-16 MED ORDER — METOPROLOL TARTRATE 50 MG PO TABS: 50.0000 mg | ORAL_TABLET | Freq: Two times a day (BID) | ORAL | 1 refills | Status: DC

## 2020-01-16 MED ORDER — DOXYCYCLINE HYCLATE 100 MG PO TABS
100.0000 mg | ORAL_TABLET | Freq: Two times a day (BID) | ORAL | Status: DC
  Administered 2020-01-16: 100 mg via ORAL
  Filled 2020-01-16: qty 1

## 2020-01-16 NOTE — Progress Notes (Signed)
Patient Name: [name] [name] Date of Encounter: 01/16/2020  Hospital Problem List     Principal Problem:   Rapid atrial fibrillation [facility]) Active Problems:   HTN (hypertension)   Left leg cellulitis   Chronic anticoagulation   Chronic ulcer of left leg (HCC)   Iron deficiency anemia    Patient Profile        Pt with history of afib, hypertension, chronic anticoagulation who was admitted with afib with rvr and sob with evidence of chf. Has converted to nsr. Had sob with improvement with iv lasix.    Subjective   Feels better. Going home today.   Inpatient Medications    . amiodarone  200 mg Oral Daily  . cephALEXin  500 mg Oral Q6H  . collagenase   Topical Daily  . digoxin  0.0625 mg Oral Daily  . doxycycline  100 mg Oral Q12H  . furosemide  40 mg Oral Daily  . iron polysaccharides  150 mg Oral Daily  . metoprolol tartrate  50 mg Oral BID  . polyethylene glycol  17 g Oral Daily  . polymixin-bacitracin   Topical Daily  . potassium chloride  20 mEq Oral BID  . pravastatin  40 mg Oral q1800  . Rivaroxaban  15 mg Oral Q supper  . verapamil  180 mg Oral Daily    Vital Signs    Vitals:   01/15/20 1938 01/15/20 2155 01/16/20 0501 01/16/20 0807  BP: (!) 123/57 123/61 121/69 135/66  Pulse: 70 67 64 70  Resp: 17  16   Temp: 98.3 F (36.8 C)  98.4 F (36.9 C) 98.5 F (36.9 C)  TempSrc: Oral  Oral Oral  SpO2: 92%  94% 94%  Weight:   56.9 kg   Height:        Intake/Output Summary (Last 24 hours) at 01/16/2020 1124 Last data filed at 01/16/2020 0600 Gross per 24 hour  Intake 120 ml  Output 2400 ml  Net -2280 ml   Filed Weights   01/14/20 0218 01/15/20 0357 01/16/20 0501  Weight: 59.6 kg 64.2 kg 56.9 kg    Physical Exam    GEN: Well nourished, well developed, in no acute distress.  HEENT: normal.  Neck: Supple, no JVD, carotid bruits, or masses. Cardiac: RRR, no murmurs, rubs, or gallops. No clubbing, cyanosis, edema.  Radials/DP/PT 2+ and equal  bilaterally.  Respiratory:  Respirations regular and unlabored, clear to auscultation bilaterally. GI: Soft, nontender, nondistended, BS + x 4. MS: no deformity or atrophy. Skin: warm and dry, no rash. Neuro:  Strength and sensation are intact. Psych: Normal affect.  Labs    CBC Recent Labs    01/15/20 1632 01/16/20 0513  WBC 13.2* 10.4  NEUTROABS 9.3* 6.4  HGB 10.4* 10.3*  HCT 35.9* 34.7*  MCV 84.9 84.4  PLT 405* 412   Basic Metabolic Panel Recent Labs    01/14/20 0628 01/16/20 0513  NA 136 140  K 4.3 4.3  CL 104 103  CO2 24 28  GLUCOSE 95 97  BUN 27* 28*  CREATININE 0.65 0.77  CALCIUM 8.6* 8.9  MG 2.2 2.2   Liver Function Tests No results for input(s): AST, ALT, ALKPHOS, BILITOT, PROT, ALBUMIN in the last 72 hours. No results for input(s): LIPASE, AMYLASE in the last 72 hours. Cardiac Enzymes No results for input(s): CKTOTAL, CKMB, CKMBINDEX, TROPONINI in the last 72 hours. BNP Recent Labs    01/15/20 0238  BNP 654.7*   D-Dimer No results for  input(s): DDIMER in the last 72 hours. Hemoglobin A1C No results for input(s): HGBA1C in the last 72 hours. Fasting Lipid Panel No results for input(s): CHOL, HDL, LDLCALC, TRIG, CHOLHDL, LDLDIRECT in the last 72 hours. Thyroid Function Tests No results for input(s): TSH, T4TOTAL, T3FREE, THYROIDAB in the last 72 hours.  Invalid input(s): FREET3  Telemetry    nsr  ECG    afib with rvr  Radiology    DG Tibia/Fibula Left  Result Date: 01/11/2020 CLINICAL DATA:  Left leg wound, swelling. Left leg cellulitis. Increasing redness and swelling. EXAM: LEFT TIBIA AND FIBULA - 2 VIEW COMPARISON:  Radiograph 12/31/2019 FINDINGS: No bony destruction or evidence of osteomyelitis. No fracture. The cortical margins of the tibia and fibular intact. Degenerative change about the knee, stable from prior. Ankle alignment is maintained. Generalized subcutaneous soft tissue edema which is improved from prior radiograph. No  soft tissue air. No radiopaque foreign body. IMPRESSION: Improving subcutaneous soft tissue edema over the past 11 days. No soft tissue air or radiographic findings of osteomyelitis. Electronically Signed   By: Keith Rake M.D.   On: 01/11/2020 17:52   DG Tibia/Fibula Left  Result Date: 12/31/2019 CLINICAL DATA:  Left leg wound evaluate for osteomyelitis EXAM: LEFT TIBIA AND FIBULA - 2 VIEW COMPARISON:  None. FINDINGS: There is diffuse edematous swelling of the lower extremity. More focal irregularity and ulceration with superficial cutaneous lucency and overlying bandaging material is seen along the anterior aspect of the distal lower leg, superficial to the lower third tibial diaphysis. No other soft tissue gas or foreign body. No subjacent osseous erosion, destruction or periostitis changes are seen to suggest underlying osteomyelitis at this location or elsewhere within the lower extremity. Mild-to-moderate tricompartmental degenerative changes are noted in the knee with chondrocalcinosis of the medial and lateral menisci. Additional mild degenerative changes of the ankle. No sizable ankle effusion. IMPRESSION: 1. Soft tissue irregularity and ulceration along the anterior aspect of the distal lower leg, superficial to the lower third tibial diaphysis. No radiographic evidence of osteomyelitis. 2. Diffuse edematous swelling of the lower extremity. 3. Degenerative changes in the knee and ankle. 4. 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 Electronically Signed   By: Lovena Le M.D.   On: 12/31/2019 20:27   US Venous Img Lower Unilateral Left  Result Date: 01/11/2020 CLINICAL DATA:  Left leg wound with swelling EXAM: Left LOWER EXTREMITY VENOUS DOPPLER ULTRASOUND TECHNIQUE: Gray-scale sonography with compression, as well as color and duplex ultrasound, were performed to evaluate the deep venous system(s) from the level of the common femoral vein  through the popliteal and proximal calf veins. COMPARISON:  Radiograph 01/11/2020 FINDINGS: VENOUS Normal compressibility of the common femoral, superficial femoral, and popliteal veins, as well as the visualized calf veins. Visualized portions of profunda femoral vein and great saphenous vein unremarkable. No filling defects to suggest DVT on grayscale or color Doppler imaging. Doppler waveforms show normal direction of venous flow, normal respiratory plasticity and response to augmentation. Limited views of the contralateral common femoral vein are unremarkable. OTHER None. Limitations: none IMPRESSION: Negative. Electronically Signed   By: Donavan Foil M.D.   On: 01/11/2020 18:18   DG Chest Port 1 View  Result Date: 01/15/2020 CLINICAL DATA:  Hypoxia EXAM: PORTABLE CHEST 1 VIEW COMPARISON:  01/11/2020 FINDINGS: Cardiac shadow is stable. Aortic calcifications are again seen. Bilateral pleural effusions are noted slightly greater than that seen on the prior exam. Underlying atelectasis is present  as well. No bony abnormality is seen. IMPRESSION: Increasing bilateral pleural effusions with underlying basilar atelectasis. Electronically Signed   By: Inez Catalina M.D.   On: 01/15/2020 09:33   DG Chest Portable 1 View  Result Date: 01/11/2020 CLINICAL DATA:  Shortness of breath evaluate for edema EXAM: PORTABLE CHEST 1 VIEW COMPARISON:  12/31/2019 FINDINGS: Heart is borderline in size. Bilateral interstitial prominence and lower lobe airspace opacities. Small bilateral effusions. No acute bony abnormality. IMPRESSION: Interstitial prominence and lower lobe opacities could reflect edema or infection. Small bilateral effusions. Electronically Signed   By: Rolm Baptise M.D.   On: 01/11/2020 21:12   DG Chest Portable 1 View  Result Date: 12/31/2019 CLINICAL DATA:  86 year old female with weakness. EXAM: PORTABLE CHEST 1 VIEW COMPARISON:  Chest radiograph dated 07/26/2019. FINDINGS: There is background of  emphysema and chronic interstitial coarsening and bronchitic changes. No focal consolidation, pleural effusion or pneumothorax. The cardiac silhouette is within limits. Atherosclerotic calcification of the aorta. No acute osseous pathology. Osteopenia with degenerative changes of the spine. IMPRESSION: No active disease. Electronically Signed   By: Anner Crete M.D.   On: 12/31/2019 20:27    Assessment & Plan    Principal Problem: Rapid atrial fibrillation (HCC) Active Problems: HTN (hypertension) Left leg cellulitis Chronic anticoagulation Chronic ulcer of left leg (HCC) Iron deficiency anemia  Plan  Converted to NSR continue current meds including amiodarone 200 daily, metoprolol tartrate 50 mg bid, and verapamil 180 mg daily. Continue with lanoxin at 0.0625 daily.  Anticoug for afib with xarelto 20 mg daily.  Will continue withlasix 40 mg daily Continue antibx therapy for cellulitis Follow up Dr. Nehemiah Massed in 1 week after discharge today.   Signed, Javier Docker Brettney Ficken MD 01/16/2020, 11:24 AM  Pager: [phone]

## 2020-01-16 NOTE — Consult Note (Signed)
ANTICOAGULATION CONSULT NOTE - Initial Consult  Pharmacy Consult for Xarelto Indication: Non-valvular atrial fibrillation  Allergies  Allergen Reactions  . Bactrim [Sulfamethoxazole-Trimethoprim] Other (See Comments)    Hearing loss  . Ciprofloxacin Other (See Comments)    Flu-like symptoms  . Pneumococcal Vaccines Other (See Comments)    Flu-like symptoms    Patient Measurements: Height: 5\' 3"  (160 cm) Weight: 56.9 kg (125 lb 6.4 oz) IBW/kg (Calculated) : 52.4  Vital Signs: Temp: 98.5 F (36.9 C) (12/18 0807) Temp Source: Oral (12/18 0807) BP: 135/66 (12/18 0807) Pulse Rate: 70 (12/18 0807)  Labs: Recent Labs    01/14/20 0628 01/15/20 0520 01/15/20 1632 01/16/20 0513  HGB 10.0* 9.8* 10.4* 10.3*  HCT 33.5* 33.3* 35.9* 34.7*  PLT 308 317 405* 390  CREATININE 0.65  --   --  0.77    Estimated Creatinine Clearance: 40.2 mL/min (by C-G formula based on SCr of 0.77 mg/dL).   Medical History: Past Medical History:  Diagnosis Date  . Anxiety   . Cancer ([place])    Squamous Cell Carcinoma (top of head)  . Constipation, chronic   . Deaf, left   . Diverticular disease   . Hearing aid worn   . Hearing deficit   . History of Bell's palsy    left side - resolved - 50 yrs ago  . Hypertension   . Internal hemorrhoids   . Osteopenia   . Pure hypercholesterolemia     Medications/Assessment:  Pt on Xarelto prior to admission, but per pt report has not been taking - pt initially ordered Eliquis on admission, but pharmacy has been consulted to re-initiate Xarelto.    Goal of Therapy:  Monitor platelets by anticoagulation protocol: Yes   Plan:  Will  start Xarelto 15mg  qd  (per renal function CrCl < 50 ml/min)  Pernell Dupre, PharmD, BCPS Clinical Pharmacist 01/16/2020 9:30 AM

## 2020-01-16 NOTE — TOC Transition Note (Signed)
Transition of Care [facility]) - CM/SW Discharge Note   Patient Details  Name: [name] [name] MRN: [id number] Date of Birth: 15-Dec-1929  Transition of Care [facility]) CM/SW Contact:  Harriet Masson, RN Phone Number:[phone] 01/16/2020, 10:42 AM   Clinical Narrative:    Pt will be discharge today with [facility] for approved services. Spoke with Corene Cornea who is aware of pt's d/c today. Bedside nurse will update pt [facility]) accordingly with this information. No other needs at this time.  TOC team remains available to assist with any other need for discharge.         Patient Goals and CMS Choice        Discharge Placement                       Discharge Plan and Services                                     Social Determinants of Health (SDOH) Interventions     Readmission Risk Interventions No flowsheet data found.

## 2020-01-16 NOTE — Discharge Instructions (Signed)

## 2020-01-16 NOTE — Plan of Care (Signed)
  Problem: Education: Goal: Knowledge of General Education information will improve Description: Including pain rating scale, medication(s)/side effects and non-pharmacologic comfort measures Outcome: Progressing   Problem: Health Behavior/Discharge Planning: Goal: Ability to manage health-related needs will improve Outcome: Progressing   Problem: Clinical Measurements: Goal: Diagnostic test results will improve Outcome: Progressing Goal: Respiratory complications will improve Outcome: Progressing Goal: Cardiovascular complication will be avoided Outcome: Progressing   Problem: Coping: Goal: Level of anxiety will decrease Outcome: Progressing   Problem: Elimination: Goal: Will not experience complications related to urinary retention Outcome: Progressing   Problem: Safety: Goal: Ability to remain free from injury will improve Outcome: Progressing   

## 2020-01-16 NOTE — Discharge Summary (Signed)
Physician Discharge Summary  [name] [name] XKG:[id number] DOB: 1929-11-10 DOA: 01/11/2020  PCP: [name], MD  Admit date: 01/11/2020 Discharge date: 01/16/2020  Admitted From: home Disposition:  home  Recommendations for Outpatient Follow-up:  1. Follow up with PCP in 1-2 weeks 2. Please obtain BMP/CBC in one week 3. Please follow up in wound care clinic 4. Follow up with cardiology in 1 week  Home Health: PT, OT, RN  Equipment/Devices: None   Discharge Condition: Stable  CODE STATUS: Full  Diet recommendation: Heart Healthy    Discharge Diagnoses: Principal Problem:   Rapid atrial fibrillation (HCC) Active Problems:   HTN (hypertension)   Left leg cellulitis   Chronic anticoagulation   Chronic ulcer of left leg (HCC)   Iron deficiency anemia    Summary of HPI and Hospital Course:  Per 12/14 note by Dr. Si Raider: "[name] [name] is a 86 y.o. female with medical history significant for atrial fibrillation on Eliquis, HTN, recently hospitalized from 12/2-12/5 with cellulitis of left leg secondary to an infected and bleeding leg wound with anemia requiring blood transfusion, treated with Rocephin and vancomycin, and who has not used Eliquis since presents to the emergency room with complaints of persistent left leg swelling.  She denies pain in the leg or bleeding from the wound.  She also has concerns for which medication she supposed to be on.  Was previously on amiodarone, metoprolol, verapamil but states she has only been using the verapamil.  She denies fever or chills, denies cough, chest pain or shortness of breath."     Acute respiratory failure with hypoxia -overnight 12/16-17 patient became acutely hypoxic and dyspneic, required BiPAP briefly.  She has been weaned off oxygen today.  She had elevated BNP and improved after getting Lasix.  Chest x-ray today does show increasing bilateral pleural effusions with underlying basilar atelectasis.  Gave repeat  dose of IV Lasix yesterday AM. --Discussed with cardiology, recommend starting oral Lasix 40 mg daily --Close cardiology follow-up in 1 week   Atrial fibrillation with RVR - POA.  Appears was not on home metoprol/amiodarone which pt was discharged with earlier this month. Per last cardiology visit note, it's possible PCP had previously stopped those meds, question patient didn't tolerate, but was on those meds at recent hospitalization and HR was controlled at that time.  --Initially treated with Cardizem infusion, rate controlled and infusion stopped --Resumed on home metoprolol, verapamil, amiodarone --Digoxin was added --Patient had been off anticoagulation at home due to bleeding (or fear of of bleeding) from LLE ulcer --Resumed on Xarelto Follow up with cardiology in 1 week.   Left lower extremity cellulitis -patient has chronic venous stasis with ulcers bilaterally, worse on the left. Left side has surrounding erythema and warmth, was weeping at home per patient. Recent admission for this 12/2-5, treated with vancomycin and Rocephin, discharged on Augmentin and doxy that patient states she completed. Lower extremity Doppler was negative for DVT. Left tib-fib x-ray showed no evidence of osteomyelitis, only subcutaneous soft tissue edema which has improved over 11 days from prior image. --Treated with Ancef and transitioned to Keflex and Doxycycline --Monitor legs closely, HH RN to follow --Wound care clinic follow up  Hypokalemia -replaced.  Likely due at least in part to HCTZ which has been held.   Repeat BMP in follow up.  Chronic venous stasis ulcers - follow up in wound care clinic.  [place] RN also arranged to follow at home.  Essential hypertension -chronic, controlled.  Continue metoprolol and verapamil as above.   Stopped HCTZ. PCP follow up  Iron deficiency anemia-possibly due to oozing from left lower extremity ulcer.   Hemoglobin improved to around 10, was 8.3  prior admission.   Of note, patient did require blood transfusion during prior admission.   No signs of bleeding this admission. CBC in follow up.      Discharge Instructions   Discharge Instructions    (HEART FAILURE PATIENTS) Call MD:  Anytime you have any of the following symptoms: 1) 3 pound weight gain in 24 hours or 5 pounds in 1 week 2) shortness of breath, with or without a dry hacking cough 3) swelling in the hands, feet or stomach 4) if you have to sleep on extra pillows at night in order to breathe.   Complete by: As directed    Amb referral to AFIB Clinic   Complete by: As directed    Call MD for:  extreme fatigue   Complete by: As directed    Call MD for:  persistant dizziness or light-headedness   Complete by: As directed    Call MD for:  redness, tenderness, or signs of infection (pain, swelling, redness, odor or green/yellow discharge around incision site)   Complete by: As directed    Call MD for:  severe uncontrolled pain   Complete by: As directed    Call MD for:  temperature >100.4   Complete by: As directed    Diet - low sodium heart healthy   Complete by: As directed    Discharge instructions   Complete by: As directed    Please finish antibiotics (Keflex and doxycycline) as prescribed.  Home health nurse and [facility] will follow up with you and be sure your wounds are healing, and can help with proper wound care as they continue to improve.    If the wounds are draining clear (watery looking) fluid, that is okay and fairly normal with wound healing, not infection. If draining becomes cloudy or looks like pus, that is sign of infection.  Other signs of infection are fever and chills, worsening pain, worsening redness and warmth around the wounds.    Please follow up with Cardiology in 1 week since your medications were changed to control your A-fib.   Discharge wound care:   Complete by: As directed    Change left leg dressings once daily as  follows: - Clean wounds with saline and pat dry - Apply antibiotic ointment to wounds and cover with moist gause, then dry 4x4 gauze and Kerlix  Follow up in wound care clinic for ongoing wound care and instructions for care at home.   Increase activity slowly   Complete by: As directed      Allergies as of 01/16/2020      Reactions   Bactrim [sulfamethoxazole-trimethoprim] Other (See Comments)   Hearing loss   Ciprofloxacin Other (See Comments)   Flu-like symptoms   Pneumococcal Vaccines Other (See Comments)   Flu-like symptoms      Medication List    STOP taking these medications   hydrochlorothiazide 25 MG tablet Commonly known as: HYDRODIURIL     TAKE these medications   amiodarone 200 MG tablet Commonly known as: PACERONE Take 1 tablet (200 mg total) by mouth daily. Start taking on: January 17, 2020   ascorbic acid 1000 MG tablet Commonly known as: VITAMIN C Take 1,000 mg by mouth 2 (two) times daily.   B-complex with vitamin C tablet Take  1 tablet by mouth daily.   calcium carbonate 1250 MG capsule Take 2 capsules by mouth 2 (two) times daily with a meal.   cephALEXin 500 MG capsule Commonly known as: KEFLEX Take 1 capsule (500 mg total) by mouth every 6 (six) hours for 6 doses.   Cholecalciferol 25 MCG (1000 UT) tablet Take 1 tablet by mouth in the morning and at bedtime.   Digoxin 62.5 MCG Tabs Take 0.0625 mg by mouth daily. Start taking on: January 17, 2020   doxycycline 100 MG tablet Commonly known as: VIBRA-TABS Take 1 tablet (100 mg total) by mouth every 12 (twelve) hours for 3 days.   folic acid 409 MCG tablet Commonly known as: FOLVITE Take 800 mcg by mouth daily.   furosemide 40 MG tablet Commonly known as: LASIX Take 1 tablet (40 mg total) by mouth daily. Start taking on: January 17, 2020   GLUCOSAMINE PO Take 2 Doses by mouth at bedtime.   iron polysaccharides 150 MG capsule Commonly known as: NIFEREX Take 1 capsule (150 mg  total) by mouth daily.   lovastatin 40 MG tablet Commonly known as: MEVACOR Take 40 mg by mouth at bedtime.   metoprolol tartrate 50 MG tablet Commonly known as: LOPRESSOR Take 1 tablet (50 mg total) by mouth 2 (two) times daily. What changed:   medication strength  how much to take   polymixin-bacitracin 500-10000 UNIT/GM Oint ointment Apply 1 application topically daily. To left leg wounds   potassium chloride SA 20 MEQ tablet Commonly known as: KLOR-CON Take 1 tablet (20 mEq total) by mouth daily for 14 days.   Rivaroxaban 15 MG Tabs tablet Commonly known as: XARELTO Take 1 tablet (15 mg total) by mouth daily with supper. What changed:   medication strength  how much to take   Santyl ointment Generic drug: collagenase Apply 1 application topically daily.   selenium 50 MCG Tabs tablet Take 50 mcg by mouth daily.   tretinoin 0.05 % cream Commonly known as: RETIN-A Apply 1 Dose topically at bedtime.   verapamil 180 MG CR tablet Commonly known as: CALAN-SR Take 180 mg by mouth daily.            Discharge Care Instructions  (From admission, onward)         Start     Ordered   01/16/20 0000  Discharge wound care:       Comments: Change left leg dressings once daily as follows: - Clean wounds with saline and pat dry - Apply antibiotic ointment to wounds and cover with moist gause, then dry 4x4 gauze and Kerlix  Follow up in wound care clinic for ongoing wound care and instructions for care at home.   01/16/20 1033          Follow-up Information    [facility]. Schedule an appointment as soon as possible for a visit in 1 week(s).   Specialty: Wound Care Why: please make appointment for follow up of left leg wound Contact information: [address] [id number] [place] [phone]       Corey Skains, MD. Schedule an appointment as soon as possible for a visit in 2 week(s).   Specialty:  Cardiology Contact information: [facility] [place] [address] [phone]        [name], MD. Schedule an appointment as soon as possible for a visit in 1 week(s).   Specialty: Family Medicine Contact information: [address] [place] [address] [phone]              Allergies  Allergen Reactions  . Bactrim [Sulfamethoxazole-Trimethoprim] Other (See Comments)    Hearing loss  . Ciprofloxacin Other (See Comments)    Flu-like symptoms  . Pneumococcal Vaccines Other (See Comments)    Flu-like symptoms    Consultations:  Cardiology     Procedures/Studies: DG Tibia/Fibula Left  Result Date: 01/11/2020 CLINICAL DATA:  Left leg wound, swelling. Left leg cellulitis. Increasing redness and swelling. EXAM: LEFT TIBIA AND FIBULA - 2 VIEW COMPARISON:  Radiograph 12/31/2019 FINDINGS: No bony destruction or evidence of osteomyelitis. No fracture. The cortical margins of the tibia and fibular intact. Degenerative change about the knee, stable from prior. Ankle alignment is maintained. Generalized subcutaneous soft tissue edema which is improved from prior radiograph. No soft tissue air. No radiopaque foreign body. IMPRESSION: Improving subcutaneous soft tissue edema over the past 11 days. No soft tissue air or radiographic findings of osteomyelitis. Electronically Signed   By: Keith Rake M.D.   On: 01/11/2020 17:52   DG Tibia/Fibula Left  Result Date: 12/31/2019 CLINICAL DATA:  Left leg wound evaluate for osteomyelitis EXAM: LEFT TIBIA AND FIBULA - 2 VIEW COMPARISON:  None. FINDINGS: There is diffuse edematous swelling of the lower extremity. More focal irregularity and ulceration with superficial cutaneous lucency and overlying bandaging material is seen along the anterior aspect of the distal lower leg, superficial to the lower third tibial diaphysis. No other soft tissue gas or foreign body. No subjacent  osseous erosion, destruction or periostitis changes are seen to suggest underlying osteomyelitis at this location or elsewhere within the lower extremity. Mild-to-moderate tricompartmental degenerative changes are noted in the knee with chondrocalcinosis of the medial and lateral menisci. Additional mild degenerative changes of the ankle. No sizable ankle effusion. IMPRESSION: 1. Soft tissue irregularity and ulceration along the anterior aspect of the distal lower leg, superficial to the lower third tibial diaphysis. No radiographic evidence of osteomyelitis. 2. Diffuse edematous swelling of the lower extremity. 3. Degenerative changes in the knee and ankle. 4. 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 Electronically Signed   By: Lovena Le M.D.   On: 12/31/2019 20:27   US Venous Img Lower Unilateral Left  Result Date: 01/11/2020 CLINICAL DATA:  Left leg wound with swelling EXAM: Left LOWER EXTREMITY VENOUS DOPPLER ULTRASOUND TECHNIQUE: Gray-scale sonography with compression, as well as color and duplex ultrasound, were performed to evaluate the deep venous system(s) from the level of the common femoral vein through the popliteal and proximal calf veins. COMPARISON:  Radiograph 01/11/2020 FINDINGS: VENOUS Normal compressibility of the common femoral, superficial femoral, and popliteal veins, as well as the visualized calf veins. Visualized portions of profunda femoral vein and great saphenous vein unremarkable. No filling defects to suggest DVT on grayscale or color Doppler imaging. Doppler waveforms show normal direction of venous flow, normal respiratory plasticity and response to augmentation. Limited views of the contralateral common femoral vein are unremarkable. OTHER None. Limitations: none IMPRESSION: Negative. Electronically Signed   By: Donavan Foil M.D.   On: 01/11/2020 18:18   DG Chest Port 1 View  Result Date: 01/15/2020 CLINICAL DATA:   Hypoxia EXAM: PORTABLE CHEST 1 VIEW COMPARISON:  01/11/2020 FINDINGS: Cardiac shadow is stable. Aortic calcifications are again seen. Bilateral pleural effusions are noted slightly greater than that seen on the prior exam. Underlying atelectasis is present as well. No bony abnormality is seen. IMPRESSION:  Increasing bilateral pleural effusions with underlying basilar atelectasis. Electronically Signed   By: Inez Catalina M.D.   On: 01/15/2020 09:33   DG Chest Portable 1 View  Result Date: 01/11/2020 CLINICAL DATA:  Shortness of breath evaluate for edema EXAM: PORTABLE CHEST 1 VIEW COMPARISON:  12/31/2019 FINDINGS: Heart is borderline in size. Bilateral interstitial prominence and lower lobe airspace opacities. Small bilateral effusions. No acute bony abnormality. IMPRESSION: Interstitial prominence and lower lobe opacities could reflect edema or infection. Small bilateral effusions. Electronically Signed   By: Rolm Baptise M.D.   On: 01/11/2020 21:12   DG Chest Portable 1 View  Result Date: 12/31/2019 CLINICAL DATA:  86 year old female with weakness. EXAM: PORTABLE CHEST 1 VIEW COMPARISON:  Chest radiograph dated 07/26/2019. FINDINGS: There is background of emphysema and chronic interstitial coarsening and bronchitic changes. No focal consolidation, pleural effusion or pneumothorax. The cardiac silhouette is within limits. Atherosclerotic calcification of the aorta. No acute osseous pathology. Osteopenia with degenerative changes of the spine. IMPRESSION: No active disease. Electronically Signed   By: Anner Crete M.D.   On: 12/31/2019 20:27         Subjective: Pt feels well this AM.  She is worried about leg, but reassured that clear drainage is okay and not infection.  No CP or SOB or other acute complaints.     Discharge Exam: Vitals:   01/16/20 0501 01/16/20 0807  BP: 121/69 135/66  Pulse: 64 70  Resp: 16   Temp: 98.4 F (36.9 C) 98.5 F (36.9 C)  SpO2: 94% 94%   Vitals:    01/15/20 1938 01/15/20 2155 01/16/20 0501 01/16/20 0807  BP: (!) 123/57 123/61 121/69 135/66  Pulse: 70 67 64 70  Resp: 17  16   Temp: 98.3 F (36.8 C)  98.4 F (36.9 C) 98.5 F (36.9 C)  TempSrc: Oral  Oral Oral  SpO2: 92%  94% 94%  Weight:   56.9 kg   Height:        General: Pt is alert, awake, not in acute distress Cardiovascular: RRR, S1/S2 +, no rubs, no gallops Respiratory: CTA bilaterally, no wheezing, no rhonchi Abdominal: Soft, NT, ND, bowel sounds + Extremities: left lower extremity dressing in place, moves all, no cyanosis, normal temp    The results of significant diagnostics from this hospitalization (including imaging, microbiology, ancillary and laboratory) are listed below for reference.     Microbiology: Recent Results (from the past 240 hour(s))  Resp Panel by RT-PCR (Flu A&B, Covid) Nasopharyngeal Swab     Status: None   Collection Time: 01/11/20  8:21 PM   Specimen: Nasopharyngeal Swab; Nasopharyngeal(NP) swabs in vial transport medium  Result Value Ref Range Status   SARS Coronavirus 2 by RT PCR NEGATIVE NEGATIVE Final    Comment: (NOTE) SARS-CoV-2 target nucleic acids are NOT DETECTED.  The SARS-CoV-2 RNA is generally detectable in upper respiratory specimens during the acute phase of infection. The lowest concentration of SARS-CoV-2 viral copies this assay can detect is 138 copies/mL. A negative result does not preclude SARS-Cov-2 infection and should not be used as the sole basis for treatment or other patient management decisions. A negative result may occur with  improper specimen collection/handling, submission of specimen other than nasopharyngeal swab, presence of viral mutation(s) within the areas targeted by this assay, and inadequate number of viral copies(<138 copies/mL). A negative result must be combined with clinical observations, patient history, and epidemiological information. The expected result is Negative.  Fact Sheet for  Patients:  EntrepreneurPulse.com.au  Fact Sheet for Healthcare Providers:  IncredibleEmployment.be  This test is no t yet approved or cleared by the [place] FDA and  has been authorized for detection and/or diagnosis of SARS-CoV-2 by FDA under an Emergency Use Authorization (EUA). This EUA will remain  in effect (meaning this test can be used) for the duration of the COVID-19 declaration under Section 564(b)(1) of the Act, 21 U.S.C.section 360bbb-3(b)(1), unless the authorization is terminated  or revoked sooner.       Influenza A by PCR NEGATIVE NEGATIVE Final   Influenza B by PCR NEGATIVE NEGATIVE Final    Comment: (NOTE) The Xpert Xpress SARS-CoV-2/FLU/RSV plus assay is intended as an aid in the diagnosis of influenza from Nasopharyngeal swab specimens and should not be used as a sole basis for treatment. Nasal washings and aspirates are unacceptable for Xpert Xpress SARS-CoV-2/FLU/RSV testing.  Fact Sheet for Patients: EntrepreneurPulse.com.au  Fact Sheet for Healthcare Providers: IncredibleEmployment.be  This test is not yet approved or cleared by the [place] FDA and has been authorized for detection and/or diagnosis of SARS-CoV-2 by FDA under an Emergency Use Authorization (EUA). This EUA will remain in effect (meaning this test can be used) for the duration of the COVID-19 declaration under Section 564(b)(1) of the Act, 21 U.S.C. section 360bbb-3(b)(1), unless the authorization is terminated or revoked.  Performed at [facility], [address] [place], [place] [address]   MRSA PCR Screening     Status: None   Collection Time: 01/13/20  4:56 PM   Specimen: Nasal Mucosa; Nasopharyngeal  Result Value Ref Range Status   MRSA by PCR NEGATIVE NEGATIVE Final    Comment:        The GeneXpert MRSA Assay (FDA approved for NASAL specimens only), is one component of  a comprehensive MRSA colonization surveillance program. It is not intended to diagnose MRSA infection nor to guide or monitor treatment for MRSA infections. Performed at [facility], [address] [place], [place] [address]      Labs: BNP (last 3 results) Recent Labs    01/15/20 0238  BNP 242.3*   Basic Metabolic Panel: Recent Labs  Lab 01/11/20 1322 01/12/20 0441 01/13/20 0506 01/14/20 0628 01/16/20 0513  NA 144 138 141 136 140  K 4.0 3.3* 4.2 4.3 4.3  CL 109 105 108 104 103  CO2 26 23 24 24 28   GLUCOSE 111* 111* 122* 95 97  BUN 26* 23 30* 27* 28*  CREATININE 0.76 0.73 0.76 0.65 0.77  CALCIUM 9.6 8.5* 8.8* 8.6* 8.9  MG  --  2.0 2.3 2.2 2.2   Liver Function Tests: Recent Labs  Lab 01/11/20 1322  AST 24  ALT 19  ALKPHOS 80  BILITOT 0.7  PROT 7.2  ALBUMIN 3.6   No results for input(s): LIPASE, AMYLASE in the last 168 hours. No results for input(s): AMMONIA in the last 168 hours. CBC: Recent Labs  Lab 01/11/20 1322 01/12/20 0441 01/13/20 0506 01/14/20 0628 01/15/20 0520 01/15/20 1632 01/16/20 0513  WBC 9.6   < > 9.3 10.9* 11.8* 13.2* 10.4  NEUTROABS 6.2  --   --   --   --  9.3* 6.4  HGB 10.6*   < > 10.4* 10.0* 9.8* 10.4* 10.3*  HCT 36.4   < > 35.2* 33.5* 33.3* 35.9* 34.7*  MCV 84.7   < > 83.6 83.3 85.8 84.9 84.4  PLT 314   < > 323 308 317 405* 390   < > =  values in this interval not displayed.   Cardiac Enzymes: No results for input(s): CKTOTAL, CKMB, CKMBINDEX, TROPONINI in the last 168 hours. BNP: Invalid input(s): POCBNP CBG: No results for input(s): GLUCAP in the last 168 hours. D-Dimer No results for input(s): DDIMER in the last 72 hours. Hgb A1c No results for input(s): HGBA1C in the last 72 hours. Lipid Profile No results for input(s): CHOL, HDL, LDLCALC, TRIG, CHOLHDL, LDLDIRECT in the last 72 hours. Thyroid function studies No results for input(s): TSH, T4TOTAL, T3FREE, THYROIDAB in the last 72 hours.  Invalid  input(s): FREET3 Anemia work up No results for input(s): VITAMINB12, FOLATE, FERRITIN, TIBC, IRON, RETICCTPCT in the last 72 hours. Urinalysis    Component Value Date/Time   COLORURINE YELLOW (A) 01/11/2020 1322   APPEARANCEUR HAZY (A) 01/11/2020 1322   APPEARANCEUR Cloudy 05/31/2012 2019   LABSPEC 1.017 01/11/2020 1322   LABSPEC 1.011 05/31/2012 2019   PHURINE 5.0 01/11/2020 1322   GLUCOSEU NEGATIVE 01/11/2020 1322   GLUCOSEU 50 mg/dL 05/31/2012 2019   HGBUR NEGATIVE 01/11/2020 1322   BILIRUBINUR NEGATIVE 01/11/2020 1322   BILIRUBINUR Negative 05/31/2012 2019   KETONESUR NEGATIVE 01/11/2020 1322   PROTEINUR NEGATIVE 01/11/2020 1322   NITRITE NEGATIVE 01/11/2020 1322   LEUKOCYTESUR NEGATIVE 01/11/2020 1322   LEUKOCYTESUR Negative 05/31/2012 2019   Sepsis Labs Invalid input(s): PROCALCITONIN,  WBC,  LACTICIDVEN Microbiology Recent Results (from the past 240 hour(s))  Resp Panel by RT-PCR (Flu A&B, Covid) Nasopharyngeal Swab     Status: None   Collection Time: 01/11/20  8:21 PM   Specimen: Nasopharyngeal Swab; Nasopharyngeal(NP) swabs in vial transport medium  Result Value Ref Range Status   SARS Coronavirus 2 by RT PCR NEGATIVE NEGATIVE Final    Comment: (NOTE) SARS-CoV-2 target nucleic acids are NOT DETECTED.  The SARS-CoV-2 RNA is generally detectable in upper respiratory specimens during the acute phase of infection. The lowest concentration of SARS-CoV-2 viral copies this assay can detect is 138 copies/mL. A negative result does not preclude SARS-Cov-2 infection and should not be used as the sole basis for treatment or other patient management decisions. A negative result may occur with  improper specimen collection/handling, submission of specimen other than nasopharyngeal swab, presence of viral mutation(s) within the areas targeted by this assay, and inadequate number of viral copies(<138 copies/mL). A negative result must be combined with clinical observations,  patient history, and epidemiological information. The expected result is Negative.  Fact Sheet for Patients:  EntrepreneurPulse.com.au  Fact Sheet for Healthcare Providers:  IncredibleEmployment.be  This test is no t yet approved or cleared by the [place] FDA and  has been authorized for detection and/or diagnosis of SARS-CoV-2 by FDA under an Emergency Use Authorization (EUA). This EUA will remain  in effect (meaning this test can be used) for the duration of the COVID-19 declaration under Section 564(b)(1) of the Act, 21 U.S.C.section 360bbb-3(b)(1), unless the authorization is terminated  or revoked sooner.       Influenza A by PCR NEGATIVE NEGATIVE Final   Influenza B by PCR NEGATIVE NEGATIVE Final    Comment: (NOTE) The Xpert Xpress SARS-CoV-2/FLU/RSV plus assay is intended as an aid in the diagnosis of influenza from Nasopharyngeal swab specimens and should not be used as a sole basis for treatment. Nasal washings and aspirates are unacceptable for Xpert Xpress SARS-CoV-2/FLU/RSV testing.  Fact Sheet for Patients: EntrepreneurPulse.com.au  Fact Sheet for Healthcare Providers: IncredibleEmployment.be  This test is not yet approved or cleared by the [place] and  has been authorized for detection and/or diagnosis of SARS-CoV-2 by FDA under an Emergency Use Authorization (EUA). This EUA will remain in effect (meaning this test can be used) for the duration of the COVID-19 declaration under Section 564(b)(1) of the Act, 21 U.S.C. section 360bbb-3(b)(1), unless the authorization is terminated or revoked.  Performed at [facility], [address] [place], [place] [address]   MRSA PCR Screening     Status: None   Collection Time: 01/13/20  4:56 PM   Specimen: Nasal Mucosa; Nasopharyngeal  Result Value Ref Range Status   MRSA by PCR NEGATIVE NEGATIVE Final    Comment:         The GeneXpert MRSA Assay (FDA approved for NASAL specimens only), is one component of a comprehensive MRSA colonization surveillance program. It is not intended to diagnose MRSA infection nor to guide or monitor treatment for MRSA infections. Performed at [facility], [address] [place], [place] [address]      Time coordinating discharge: Over 30 minutes  SIGNED:   Ezekiel Slocumb, DO [facility] 01/16/2020, 10:33 AM   If 7PM-7AM, please contact night-coverage [email or web address]

## 2020-02-03 ENCOUNTER — Encounter: Payer: Medicare Other | Attending: Internal Medicine | Admitting: Internal Medicine

## 2020-02-03 ENCOUNTER — Other Ambulatory Visit: Payer: Self-pay

## 2020-02-03 DIAGNOSIS — E785 Hyperlipidemia, unspecified: Secondary | ICD-10-CM | POA: Diagnosis not present

## 2020-02-03 DIAGNOSIS — L97821 Non-pressure chronic ulcer of other part of left lower leg limited to breakdown of skin: Secondary | ICD-10-CM | POA: Diagnosis present

## 2020-02-03 DIAGNOSIS — I890 Lymphedema, not elsewhere classified: Secondary | ICD-10-CM | POA: Diagnosis not present

## 2020-02-03 DIAGNOSIS — I480 Paroxysmal atrial fibrillation: Secondary | ICD-10-CM | POA: Diagnosis not present

## 2020-02-03 DIAGNOSIS — I10 Essential (primary) hypertension: Secondary | ICD-10-CM | POA: Insufficient documentation

## 2020-02-03 DIAGNOSIS — Z85828 Personal history of other malignant neoplasm of skin: Secondary | ICD-10-CM | POA: Insufficient documentation

## 2020-02-03 DIAGNOSIS — I87312 Chronic venous hypertension (idiopathic) with ulcer of left lower extremity: Secondary | ICD-10-CM | POA: Diagnosis not present

## 2020-02-04 DIAGNOSIS — R600 Localized edema: Secondary | ICD-10-CM | POA: Insufficient documentation

## 2020-02-05 NOTE — Progress Notes (Signed)
CRYSTEL, DEMARCO ([id number]) Visit Report for 02/03/2020 Allergy List Details Patient Name: [name] [name], [name] [name]. Date of Service: 02/03/2020 1:00 PM Medical Record Number: [id number] Patient Account Number: [id number] Date of Birth/Sex: 01/08/1930 (87 y.o. F) Treating RN: Carlene Coria Primary Care Ramandeep Arington: [name] Other Clinician: Referring Treyvon Blahut: Sharen Hones Treating Tracye Szuch/Extender: Ricard Dillon Weeks in Treatment: 0 Allergies Active Allergies Bactrim Type: Medication Cipro Type: Medication pneumococcal vaccine Type: Food Polysporin Type: Medication Allergy Notes Electronic Signature(s) Signed: 02/03/2020 3:52:48 PM By: Gretta Cool, BSN, RN, CWS, Kim RN, BSN Entered By: Gretta Cool, BSN, RN, CWS, Kim on 02/03/2020 15:52:48 [name] [name] ([id number]) -------------------------------------------------------------------------------- Arrival Information Details Patient Name: [name] [name] Date of Service: 02/03/2020 1:00 PM Medical Record Number: [id number] Patient Account Number: [id number] Date of Birth/Sex: 03-20-1929 (87 y.o. F) Treating RN: Carlene Coria Primary Care Dyllon Henken: [name] Other Clinician: Referring Mackenze Grandison: Sharen Hones Treating Virjean Boman/Extender: Tito Dine in Treatment: 0 Visit Information Patient Arrived: Walker Arrival Time: 13:18 Accompanied By: son Transfer Assistance: None Patient Identification Verified: Yes Secondary Verification Process Completed: Yes Patient Requires Transmission-Based No Precautions: Patient Has Alerts: Yes Patient Alerts: Patient on Blood Thinner Electronic Signature(s) Signed: 02/03/2020 3:40:11 PM By: Gretta Cool, BSN, RN, CWS, Kim RN, BSN Entered By: Gretta Cool, BSN, RN, CWS, Kim on 02/03/2020 15:40:10 [name] [name] ([id number]) -------------------------------------------------------------------------------- Clinic Level of Care Assessment Details Patient Name: [name] [name] Date of Service: 02/03/2020 1:00 PM Medical Record Number: [id number] Patient Account Number: [id number] Date of Birth/Sex: [birth year] (87 y.o. F) Treating RN: Cornell Barman Primary Care Klaire Court: [name] Other Clinician: Referring Rushawn Capshaw: Sharen Hones Treating Reon Hunley/Extender: Tito Dine in Treatment: 0 Clinic Level of Care Assessment Items TOOL 1 Quantity Score []  - Use when EandM and Procedure is performed on INITIAL visit 0 ASSESSMENTS - Nursing Assessment / Reassessment X - General Physical Exam (combine w/ comprehensive assessment (listed just below) when performed on new 1 20 pt. evals) X- 1 25 Comprehensive Assessment (HX, ROS, Risk Assessments, Wounds Hx, etc.) ASSESSMENTS - Wound and Skin Assessment / Reassessment []  - Dermatologic / Skin Assessment (not related to wound area) 0 ASSESSMENTS - Ostomy and/or Continence Assessment and Care []  - Incontinence Assessment and Management 0 []  - 0 Ostomy Care Assessment and Management (repouching, etc.) PROCESS - Coordination of Care X - Simple Patient / Family Education for ongoing care 1 15 []  - 0 Complex (extensive) Patient / Family Education for ongoing care []  - 0 Staff obtains Programmer, systems, Records, Test Results / Process Orders []  - 0 Staff telephones HHA, Nursing Homes / Clarify orders / etc []  - 0 Routine Transfer to another Facility (non-emergent condition) []  - 0 Routine Hospital Admission (non-emergent condition) X- 1 15 New Admissions / Biomedical engineer / Ordering NPWT, Apligraf, etc. []  - 0 Emergency Hospital Admission (emergent condition) PROCESS - Special Needs []  - Pediatric / Minor Patient Management 0 []  - 0 Isolation Patient Management []  - 0 Hearing / Language / Visual special needs []  - 0 Assessment of Community assistance (transportation, D/C planning, etc.) []  - 0 Additional assistance / Altered mentation []  - 0 Support Surface(s) Assessment (bed, cushion,  seat, etc.) INTERVENTIONS - Miscellaneous []  - External ear exam 0 []  - 0 Patient Transfer (multiple staff / Civil Service fast streamer / Similar devices) []  - 0 Simple Staple / Suture removal (25 or less) []  - 0 Complex Staple / Suture removal (26 or more) []  - 0 Hypo/Hyperglycemic Management (do not check if billed separately) X- 1 15  Ankle / Brachial Index (ABI) - do not check if billed separately Has the patient been seen at the hospital within the last three years: Yes Total Score: 90 Level Of Care: New/Established - Level 3 [name] [name], [name] [name] ([id number]) Electronic Signature(s) Signed: 02/03/2020 5:36:58 PM By: Gretta Cool, BSN, RN, CWS, Kim RN, BSN Entered By: Gretta Cool, BSN, RN, CWS, Kim on 02/03/2020 15:56:19 [name] [name] ([id number]) -------------------------------------------------------------------------------- Compression Therapy Details Patient Name: [name] [name] Date of Service: 02/03/2020 1:00 PM Medical Record Number: [id number] Patient Account Number: [id number] Date of Birth/Sex: [birth year] (87 y.o. F) Treating RN: Cornell Barman Primary Care Zayne Marovich: [name] Other Clinician: Referring Nickolai Rinks: Sharen Hones Treating Edouard Gikas/Extender: Jeri Cos Weeks in Treatment: 0 Compression Therapy Performed for Wound Assessment: Wound #1 Left Lower Leg Performed By: Clinician Cornell Barman, RN Compression Type: Three Layer Pre Treatment ABI: 0.8 Post Procedure Diagnosis Same as Pre-procedure Electronic Signature(s) Signed: 02/03/2020 5:36:58 PM By: Gretta Cool, BSN, RN, CWS, Kim RN, BSN Entered By: Gretta Cool, BSN, RN, CWS, Kim on 02/03/2020 13:59:36 [name] [name] ([id number]) -------------------------------------------------------------------------------- Encounter Discharge Information Details Patient Name: [name] [name] Date of Service: 02/03/2020 1:00 PM Medical Record Number: [id number] Patient Account Number: [id number] Date of Birth/Sex: 05-27-1929 (87 y.o.  F) Treating RN: Cornell Barman Primary Care Elynor Kallenberger: [name] Other Clinician: Referring Ezinne Yogi: Sharen Hones Treating Thi Klich/Extender: Skipper Cliche in Treatment: 0 Encounter Discharge Information Items Discharge Condition: Stable Ambulatory Status: Ambulatory Discharge Destination: Home Transportation: Private Auto Accompanied By: son Schedule Follow-up Appointment: Yes Clinical Summary of Care: Electronic Signature(s) Signed: 02/03/2020 4:27:12 PM By: Gretta Cool, BSN, RN, CWS, Kim RN, BSN Entered By: Gretta Cool, BSN, RN, CWS, Kim on 02/03/2020 16:27:12 [name] [name] ([id number]) -------------------------------------------------------------------------------- Lower Extremity Assessment Details Patient Name: [name] [name] Date of Service: 02/03/2020 1:00 PM Medical Record Number: [id number] Patient Account Number: [id number] Date of Birth/Sex: 02/22/1929 (87 y.o. F) Treating RN: Carlene Coria Primary Care Mariane Burpee: [name] Other Clinician: Referring Gerod Caligiuri: Sharen Hones Treating Jodye Scali/Extender: Skipper Cliche in Treatment: 0 Edema Assessment Assessed: [Left: No] [Right: No] [Left: Edema] [Right: :] Calf Left: Right: Point of Measurement: 40 cm From Medial Instep 32.3 cm 32 cm Ankle Left: Right: Point of Measurement: 10 cm From Medial Instep 21 cm 23 cm Knee To Floor Left: Right: From Medial Instep 40 cm 40 cm Vascular Assessment Pulses: Dorsalis Pedis Palpable: [Left:Yes] [Right:Yes] Blood Pressure: Brachial: [Left:144] Ankle: [Left:Dorsalis Pedis: 118 0.82] Electronic Signature(s) Signed: 02/03/2020 5:36:58 PM By: Gretta Cool, BSN, RN, CWS, Kim RN, BSN Signed: 02/05/2020 4:22:48 PM By: Carlene Coria RN Entered By: Gretta Cool, BSN, RN, CWS, Kim on 02/03/2020 14:03:57 [name] [name] ([id number]) -------------------------------------------------------------------------------- Multi Wound Chart Details Patient Name: [name] [name] Date of Service: 02/03/2020 1:00 PM Medical Record Number: [id number] Patient Account Number: [id number] Date of Birth/Sex: 11/23/1929 (87 y.o. F) Treating RN: Cornell Barman Primary Care Maryjayne Kleven: [name] Other Clinician: Referring Izella Ybanez: Sharen Hones Treating Delma Villalva/Extender: Tito Dine in Treatment: 0 Vital Signs Height(in): 63 Pulse(bpm): 66 Weight(lbs): 125 Blood Pressure(mmHg): 144/79 Body Mass Index(BMI): 22 Temperature(F): 98.2 Respiratory Rate(breaths/min): 16 Photos: [1:No Photos] [N/A:N/A] Wound Location: [1:Left Lower Leg] [N/A:N/A] Wounding Event: [1:Gradually Appeared] [N/A:N/A] Primary Etiology: [1:Venous Leg Ulcer] [N/A:N/A] Comorbid History: [1:Hypertension] [N/A:N/A] Date Acquired: [1:12/30/2019] [N/A:N/A] Weeks of Treatment: [1:0] [N/A:N/A] Wound Status: [1:Open] [N/A:N/A] Measurements L x W x D (cm) [1:0.3x0.3x0.1] [N/A:N/A] Area (cm) : [1:0.071] [N/A:N/A] Volume (cm) : [1:0.007] [N/A:N/A] % Reduction in Area: [1:0.00%] [N/A:N/A] % Reduction in Volume: [1:0.00%] [N/A:N/A] Classification: [1:Full Thickness Without Exposed  Support Structures] [N/A:N/A] Exudate Amount: [1:Medium] [N/A:N/A] Exudate Type: [1:Serosanguineous] [N/A:N/A] Exudate Color: [1:red, brown] [N/A:N/A] Granulation Amount: [1:Large (67-100%)] [N/A:N/A] Granulation Quality: [1:Red, Pink] [N/A:N/A] Necrotic Amount: [1:None Present (0%)] [N/A:N/A] Exposed Structures: [1:Fat Layer (Subcutaneous Tissue): Yes Fascia: No Tendon: No Muscle: No Joint: No Bone: No] [N/A:N/A] Epithelialization: [1:None] [N/A:N/A] Debridement: [1:Chemical/Enzymatic/Mechanical] [N/A:N/A] Pre-procedure Verification/Time 13:45 [N/A:N/A] Out Taken: Instrument: [1:Other(saline and gauze)] [N/A:N/A] Bleeding: [1:Minimum] [N/A:N/A] Hemostasis Achieved: [1:Pressure] [N/A:N/A] Debridement Treatment [1:Procedure was tolerated well] [N/A:N/A] Response: Post Debridement [1:0.3x0.3x0.1]  [N/A:N/A] Measurements L x W x D (cm) Post Debridement Volume: [1:0.007] [N/A:N/A] (cm) Procedures Performed: [1:Compression Therapy Debridement] [N/A:N/A] Treatment Notes Wound #1 (Lower Leg) Wound Laterality: Left Cleanser Peri-Wound Care [name] [name], [name] [name] ([id number]) Topical Primary Dressing Silvercel Small 2x2 (in/in) Discharge Instruction: Apply Silvercel Small 2x2 (in/in) as instructed Secondary Dressing ABD Pad 5x9 (in/in) Discharge Instruction: Cover with ABD pad Secured With Compression Wrap Profore Lite LF 3 Multilayer Compression Bandaging System Discharge Instruction: Apply 3 multi-layer wrap as prescribed. Compression Stockings Jobst Farrow Wrap 4000 Quantity: 1 Left Leg Compression Amount: 30-40 mmHg Right Leg Compression Amount: 30-40 mmHg Discharge Instruction: Apply to lower extremity as directed. Add-Ons Electronic Signature(s) Signed: 02/03/2020 5:18:27 PM By: Gretta Cool, BSN, RN, CWS, Kim RN, BSN Previous Signature: 02/03/2020 3:55:42 PM Version By: Gretta Cool, BSN, RN, CWS, Kim RN, BSN Entered By: Gretta Cool, BSN, RN, CWS, Kim on 02/03/2020 17:18:27 [name] [name] ([id number]) -------------------------------------------------------------------------------- [place] Details Patient Name: [name] [name], [name] [name]. Date of Service: 02/03/2020 1:00 PM Medical Record Number: [id number] Patient Account Number: [id number] Date of Birth/Sex: Dec 19, 1929 (87 y.o. F) Treating RN: Cornell Barman Primary Care Shoshana Johal: [name] Other Clinician: Referring Kaylanni Ezelle: Sharen Hones Treating Maddux First/Extender: Tito Dine in Treatment: 0 Active Inactive Orientation to the Wound Care Program Nursing Diagnoses: Knowledge deficit related to the wound healing center program Goals: Patient/caregiver will verbalize understanding of the [facility] Date Initiated: 02/03/2020 Target Resolution Date: 02/03/2020 Goal Status:  Active Interventions: Provide education on orientation to the wound center Notes: Venous Leg Ulcer Nursing Diagnoses: Actual venous Insuffiency (use after diagnosis is confirmed) Goals: Patient will maintain optimal edema control Date Initiated: 02/03/2020 Target Resolution Date: 02/24/2020 Goal Status: Active Interventions: Assess peripheral edema status every visit. Treatment Activities: Therapeutic compression applied : 02/03/2020 Notes: Wound/Skin Impairment Nursing Diagnoses: Impaired tissue integrity Goals: Patient/caregiver will verbalize understanding of skin care regimen Date Initiated: 02/03/2020 Target Resolution Date: 02/24/2020 Goal Status: Active Ulcer/skin breakdown will have a volume reduction of 30% by week 4 Date Initiated: 02/03/2020 Target Resolution Date: 03/05/2020 Goal Status: Active Ulcer/skin breakdown will have a volume reduction of 50% by week 8 Date Initiated: 02/03/2020 Target Resolution Date: 04/02/2020 Goal Status: Active Ulcer/skin breakdown will have a volume reduction of 80% by week 12 Date Initiated: 02/03/2020 Target Resolution Date: 05/03/2020 Goal Status: Active Ulcer/skin breakdown will heal within 14 weeks Date Initiated: 02/03/2020 Target Resolution Date: 05/17/2020 Goal Status: Active [name] [name], [name] [name] ([id number]) Interventions: Provide education on ulcer and skin care Treatment Activities: Skin care regimen initiated : 02/03/2020 Topical wound management initiated : 02/03/2020 Notes: Electronic Signature(s) Signed: 02/03/2020 3:55:30 PM By: Gretta Cool, BSN, RN, CWS, Kim RN, BSN Entered By: Gretta Cool, BSN, RN, CWS, Kim on 02/03/2020 15:55:29 [name] [name] ([id number]) -------------------------------------------------------------------------------- Non-Wound Condition Assessment Details Patient Name: [name] [name] Date of Service: 02/03/2020 1:00 PM Medical Record Number: [id number] Patient Account Number: [id number] Date of Birth/Sex:  [birth year] (87 y.o. F) Treating RN: Cornell Barman Primary Care Candice Lunney: [name] Other Clinician: Referring  Margret Moat: Sharen Hones Treating Travin Marik/Extender: Jeri Cos Weeks in Treatment: 0 Non-Wound Condition: Condition: Lymphedema Location: Leg Side: Bilateral Electronic Signature(s) Signed: 02/03/2020 5:36:58 PM By: Gretta Cool, BSN, RN, CWS, Kim RN, BSN Entered By: Gretta Cool, BSN, RN, CWS, Kim on 02/03/2020 14:04:14 [name] [name] ([id number]) -------------------------------------------------------------------------------- Pain Assessment Details Patient Name: [name] [name] Date of Service: 02/03/2020 1:00 PM Medical Record Number: [id number] Patient Account Number: [id number] Date of Birth/Sex: [birth year] (87 y.o. F) Treating RN: Carlene Coria Primary Care Dalma Panchal: [name] Other Clinician: Referring Hilda Wexler: Sharen Hones Treating Diana Davenport/Extender: Tito Dine in Treatment: 0 Active Problems Location of Pain Severity and Description of Pain Patient Has Paino No Site Locations Pain Management and Medication Current Pain Management: Electronic Signature(s) Signed: 02/03/2020 3:40:18 PM By: Gretta Cool, BSN, RN, CWS, Kim RN, BSN Signed: 02/05/2020 4:22:48 PM By: Carlene Coria RN Entered By: Gretta Cool, BSN, RN, CWS, Kim on 02/03/2020 15:40:17 [name] [name] ([id number]) -------------------------------------------------------------------------------- Patient/Caregiver Education Details Patient Name: [name] [name] Date of Service: 02/03/2020 1:00 PM Medical Record Number: [id number] Patient Account Number: [id number] Date of Birth/Gender: [birth year] (87 y.o. F) Treating RN: Cornell Barman Primary Care Physician: [name] Other Clinician: Referring Physician: Sharen Hones Treating Physician/Extender: Tito Dine in Treatment: 0 Education Assessment Education Provided To: Patient Education Topics Provided Metallurgist) Signed: 02/03/2020 5:36:58 PM By: Gretta Cool, BSN, RN, CWS, Kim RN, BSN Entered By: Gretta Cool, BSN, RN, CWS, Kim on 02/03/2020 15:56:40 [name] [name] ([id number]) -------------------------------------------------------------------------------- Wound Assessment Details Patient Name: [name] [name] Date of Service: 02/03/2020 1:00 PM Medical Record Number: [id number] Patient Account Number: [id number] Date of Birth/Sex: [birth year] (87 y.o. F) Treating RN: Carlene Coria Primary Care Sukhraj Esquivias: [name] Other Clinician: Referring Mohmmad Saleeby: Sharen Hones Treating Monetta Lick/Extender: Tito Dine in Treatment: 0 Wound Status Wound Number: 1 Primary Etiology: Venous Leg Ulcer Wound Location: Left Lower Leg Wound Status: Open Wounding Event: Gradually Appeared Comorbid History: Hypertension Date Acquired: 12/30/2019 Weeks Of Treatment: 0 Clustered Wound: No Wound Measurements Length: (cm) 0.3 Width: (cm) 0.3 Depth: (cm) 0.1 Area: (cm) 0.071 Volume: (cm) 0.007 % Reduction in Area: 0% % Reduction in Volume: 0% Epithelialization: None Tunneling: No Wound Description Classification: Full Thickness Without Exposed Support Structu Exudate Amount: Medium Exudate Type: Serosanguineous Exudate Color: red, brown res Foul Odor After Cleansing: No Slough/Fibrino No Wound Bed Granulation Amount: Large (67-100%) Exposed Structure Granulation Quality: Red, Pink Fascia Exposed: No Necrotic Amount: None Present (0%) Fat Layer (Subcutaneous Tissue) Exposed: Yes Tendon Exposed: No Muscle Exposed: No Joint Exposed: No Bone Exposed: No Treatment Notes Wound #1 (Lower Leg) Wound Laterality: Left Cleanser Peri-Wound Care Topical Primary Dressing Silvercel Small 2x2 (in/in) Discharge Instruction: Apply Silvercel Small 2x2 (in/in) as instructed Secondary Dressing ABD Pad 5x9 (in/in) Discharge Instruction: Cover with ABD pad Secured With Compression  Wrap Profore Lite LF 3 Multilayer Compression Bandaging System Discharge Instruction: Apply 3 multi-layer wrap as prescribed. Compression Stockings Jobst Farrow Wrap 4000 [name] [name], [name] [name]. ([id number]) Quantity: 1 Left Leg Compression Amount: 30-40 mmHg Right Leg Compression Amount: 30-40 mmHg Discharge Instruction: Apply to lower extremity as directed. Add-Ons Electronic Signature(s) Signed: 02/03/2020 3:40:45 PM By: Gretta Cool, BSN, RN, CWS, Kim RN, BSN Signed: 02/05/2020 4:22:48 PM By: Carlene Coria RN Entered By: Gretta Cool, BSN, RN, CWS, Kim on 02/03/2020 15:40:45 [name] [name] ([id number]) -------------------------------------------------------------------------------- Vitals Details Patient Name: [name] [name] Date of Service: 02/03/2020 1:00 PM Medical Record Number: [id number] Patient Account Number: [id number] Date of Birth/Sex: May 13, 1929 (87 y.o. F) Treating RN: Carlene Coria  Primary Care Francee Setzer: [name] Other Clinician: Referring Deadrick Stidd: Sharen Hones Treating Gessica Jawad/Extender: Tito Dine in Treatment: 0 Vital Signs Time Taken: 13:22 Temperature (F): 98.2 Height (in): 63 Pulse (bpm): 63 Source: Stated Respiratory Rate (breaths/min): 16 Weight (lbs): 125 Blood Pressure (mmHg): 144/79 Source: Stated Reference Range: 80 - 120 mg / dl Body Mass Index (BMI): 22.1 Electronic Signature(s) Signed: 02/03/2020 3:40:24 PM By: Gretta Cool, BSN, RN, CWS, Kim RN, BSN Entered By: Gretta Cool, BSN, RN, CWS, Kim on 02/03/2020 15:40:23

## 2020-02-05 NOTE — Progress Notes (Signed)
[name] [name], [name] [name] ([id number]) Visit Report for 02/03/2020 Abuse/Suicide Risk Screen Details Patient Name: [name] [name], [name] [name]. Date of Service: 02/03/2020 1:00 PM Medical Record Number: [id number] Patient Account Number: [id number] Date of Birth/Sex: 05-28-1929 (87 y.o. F) Treating RN: Carlene Coria Primary Care Nunzio Banet: [name] Other Clinician: Referring Andreus Cure: Sharen Hones Treating Mayci Haning/Extender: Tito Dine in Treatment: 0 Abuse/Suicide Risk Screen Items Answer ABUSE RISK SCREEN: Has anyone close to you tried to hurt or harm you recentlyo No Do you feel uncomfortable with anyone in your familyo No Has anyone forced you do things that you didnot want to doo No Electronic Signature(s) Signed: 02/03/2020 3:53:07 PM By: Gretta Cool, BSN, RN, CWS, Kim RN, BSN Signed: 02/05/2020 4:22:48 PM By: Carlene Coria RN Entered By: Gretta Cool, BSN, RN, CWS, Kim on 02/03/2020 15:53:06 [name] [name] ([id number]) -------------------------------------------------------------------------------- Activities of Daily Living Details Patient Name: [name] [name], [name] [name]. Date of Service: 02/03/2020 1:00 PM Medical Record Number: [id number] Patient Account Number: [id number] Date of Birth/Sex: 1929/02/09 (87 y.o. F) Treating RN: Carlene Coria Primary Care Secilia Apps: [name] Other Clinician: Referring Mishon Blubaugh: Sharen Hones Treating Lore Polka/Extender: Tito Dine in Treatment: 0 Activities of Daily Living Items Answer Activities of Daily Living (Please select one for each item) Drive Automobile Completely Able Take Medications Completely Able Use Telephone Completely Able Care for Appearance Completely Able Use Toilet Completely Able Bath / Shower Completely Able Dress Self Completely Able Feed Self Completely Able Walk Completely Able Get In / Out Bed Completely Able Housework Completely Able Prepare Meals Completely Able Handle Money Completely Able Shop  for Self Completely Able Electronic Signature(s) Signed: 02/03/2020 3:53:13 PM By: Gretta Cool, BSN, RN, CWS, Kim RN, BSN Signed: 02/05/2020 4:22:48 PM By: Carlene Coria RN Entered By: Gretta Cool, BSN, RN, CWS, Kim on 02/03/2020 15:53:13 [name] [name] ([id number]) -------------------------------------------------------------------------------- Education Screening Details Patient Name: [name] [name] Date of Service: 02/03/2020 1:00 PM Medical Record Number: [id number] Patient Account Number: [id number] Date of Birth/Sex: [birth year] (87 y.o. F) Treating RN: Carlene Coria Primary Care Shiela Bruns: [name] Other Clinician: Referring Zahmir Lalla: Sharen Hones Treating Annalis Kaczmarczyk/Extender: Tito Dine in Treatment: 0 Primary Learner Assessed: Patient Learning Preferences/Education Level/Primary Language Learning Preference: Explanation Highest Education Level: College or Above Preferred Language: English Cognitive Barrier Language Barrier: No Translator Needed: No Memory Deficit: No Emotional Barrier: No Physical Barrier Impaired Vision: Yes Glasses Impaired Hearing: Yes Hearing Aid Decreased Hand dexterity: No Knowledge/Comprehension Knowledge Level: Medium Comprehension Level: High Ability to understand written instructions: High Ability to understand verbal instructions: High Motivation Anxiety Level: Anxious Cooperation: Cooperative Education Importance: Acknowledges Need Interest in Health Problems: Asks Questions Perception: Coherent Willingness to Engage in Self-Management High Activities: Readiness to Engage in Self-Management High Activities: Electronic Signature(s) Signed: 02/03/2020 3:53:24 PM By: Gretta Cool, BSN, RN, CWS, Kim RN, BSN Signed: 02/05/2020 4:22:48 PM By: Carlene Coria RN Entered By: Gretta Cool, BSN, RN, CWS, Kim on 02/03/2020 15:53:23 [name] [name], [name] [name]  ([id number]) -------------------------------------------------------------------------------- Fall Risk Assessment Details Patient Name: [name] [name] Date of Service: 02/03/2020 1:00 PM Medical Record Number: [id number] Patient Account Number: [id number] Date of Birth/Sex: [birth year] (87 y.o. F) Treating RN: Carlene Coria Primary Care Charonda Hefter: [name] Other Clinician: Referring Marquize Seib: Sharen Hones Treating Vaani Morren/Extender: Tito Dine in Treatment: 0 Fall Risk Assessment Items Have you had 2 or more falls in the last 12 monthso 0 No Have you had any fall that resulted in injury in the last 12 monthso 0 No FALLS RISK SCREEN History of falling - immediate or  within 3 months 0 No Secondary diagnosis (Do you have 2 or more medical diagnoseso) 0 No Ambulatory aid None/bed rest/wheelchair/nurse 0 No Crutches/cane/walker 0 No Furniture 0 No Intravenous therapy Access/Saline/Heparin Lock 0 No Gait/Transferring Normal/ bed rest/ wheelchair 0 No Weak (short steps with or without shuffle, stooped but able to lift head while walking, may 0 No seek support from furniture) Impaired (short steps with shuffle, may have difficulty arising from chair, head down, impaired 0 No balance) Mental Status Oriented to own ability 0 No Electronic Signature(s) Signed: 02/03/2020 3:55:10 PM By: Gretta Cool, BSN, RN, CWS, Kim RN, BSN Signed: 02/05/2020 4:22:48 PM By: Carlene Coria RN Entered By: Gretta Cool, BSN, RN, CWS, Kim on 02/03/2020 15:55:09 [name] [name] ([id number]) -------------------------------------------------------------------------------- Foot Assessment Details Patient Name: [name] [name] Date of Service: 02/03/2020 1:00 PM Medical Record Number: [id number] Patient Account Number: [id number] Date of Birth/Sex: 09-14-1929 (87 y.o. F) Treating RN: Carlene Coria Primary Care Joury Allcorn: [name] Other Clinician: Referring Ranada Vigorito: Sharen Hones Treating  Kiamesha Samet/Extender: Tito Dine in Treatment: 0 Foot Assessment Items Site Locations + = Sensation present, - = Sensation absent, C = Callus, U = Ulcer R = Redness, W = Warmth, M = Maceration, PU = Pre-ulcerative lesion F = Fissure, S = Swelling, D = Dryness Assessment Right: Left: Other Deformity: No No Prior Foot Ulcer: No No Prior Amputation: No No Charcot Joint: No No Ambulatory Status: Ambulatory Without Help Gait: Steady Electronic Signature(s) Signed: 02/03/2020 3:55:22 PM By: Gretta Cool, BSN, RN, CWS, Kim RN, BSN Signed: 02/05/2020 4:22:48 PM By: Carlene Coria RN Entered By: Gretta Cool, BSN, RN, CWS, Kim on 02/03/2020 15:55:21 [name] [name] ([id number]) -------------------------------------------------------------------------------- Nutrition Risk Screening Details Patient Name: [name] [name] Date of Service: 02/03/2020 1:00 PM Medical Record Number: [id number] Patient Account Number: [id number] Date of Birth/Sex: 08/02/1929 (87 y.o. F) Treating RN: Carlene Coria Primary Care Mayte Diers: [name] Other Clinician: Referring Armella Stogner: Sharen Hones Treating Eliyah Mcshea/Extender: Tito Dine in Treatment: 0 Height (in): 63 Weight (lbs): 125 Body Mass Index (BMI): 22.1 Nutrition Risk Screening Items Score Screening NUTRITION RISK SCREEN: I have an illness or condition that made me change the kind and/or amount of food I eat 0 No I eat fewer than two meals per day 0 No I eat few fruits and vegetables, or milk products 0 No I have three or more drinks of beer, liquor or wine almost every day 0 No I have tooth or mouth problems that make it hard for me to eat 0 No I don't always have enough money to buy the food I need 0 No I eat alone most of the time 0 No I take three or more different prescribed or over-the-counter drugs a day 1 Yes Without wanting to, I have lost or gained 10 pounds in the last six months 0 No I am not always physically able to  shop, cook and/or feed myself 0 No Nutrition Protocols Good Risk Protocol 0 No interventions needed Moderate Risk Protocol High Risk Proctocol Risk Level: Good Risk Score: 1 Electronic Signature(s) Signed: 02/03/2020 3:55:15 PM By: Gretta Cool, BSN, RN, CWS, Kim RN, BSN Signed: 02/05/2020 4:22:48 PM By: Carlene Coria RN Entered By: Gretta Cool, BSN, RN, CWS, Kim on 02/03/2020 15:55:15

## 2020-02-05 NOTE — Progress Notes (Signed)
LASHAUNTA, SICARD ([id number]) Visit Report for 02/03/2020 Chief Complaint Document Details Patient Name: [name] [name], [name] [name]. Date of Service: 02/03/2020 1:00 PM Medical Record Number: [id number] Patient Account Number: [id number] Date of Birth/Sex: Jan 06, 1930 (87 y.o. F) Treating RN: Cornell Barman Primary Care Provider: [name] Other Clinician: Referring Provider: Sharen Hones Treating Provider/Extender: Tito Dine in Treatment: 0 Information Obtained from: Patient Chief Complaint 02/03/2020; patient is here for review of wounds on her left lower leg anteriorly Electronic Signature(s) Signed: 02/03/2020 4:24:11 PM By: Gretta Cool, BSN, RN, CWS, Kim RN, BSN Signed: 02/03/2020 5:39:11 PM By: Linton Ham MD Entered By: Gretta Cool, BSN, RN, CWS, Kim on 02/03/2020 16:24:10 [name] ([id number]) -------------------------------------------------------------------------------- Debridement Details Patient Name: [name] Date of Service: 02/03/2020 1:00 PM Medical Record Number: [id number] Patient Account Number: [id number] Date of Birth/Sex: 06-Oct-1929 (87 y.o. F) Treating RN: Cornell Barman Primary Care Provider: [name] Other Clinician: Referring Provider: Sharen Hones Treating Provider/Extender: Jeri Cos Weeks in Treatment: 0 Debridement Performed for Wound #1 Left Lower Leg Assessment: Performed By: Physician Tommie Sams., PA-C Debridement Type: Chemical/Enzymatic/Mechanical Agent Used: saline and gauze Severity of Tissue Pre Debridement: Fat layer exposed Level of Consciousness (Pre- Awake and Alert procedure): Pre-procedure Verification/Time Out Yes - 13:45 Taken: Instrument: Other : saline and gauze Bleeding: Minimum Hemostasis Achieved: Pressure Response to Treatment: Procedure was tolerated well Level of Consciousness (Post- Awake and Alert procedure): Post Debridement Measurements of Total Wound Length: (cm) 0.3 Width:  (cm) 0.3 Depth: (cm) 0.1 Volume: (cm) 0.007 Character of Wound/Ulcer Post Debridement: Stable Severity of Tissue Post Debridement: Fat layer exposed Post Procedure Diagnosis Same as Pre-procedure Electronic Signature(s) Signed: 02/03/2020 5:05:48 PM By: Gretta Cool, BSN, RN, CWS, Kim RN, BSN Signed: 02/04/2020 10:09:48 AM By: Worthy Keeler PA-C Entered By: Gretta Cool, BSN, RN, CWS, Kim on 02/03/2020 17:05:47 [name] ([id number]) -------------------------------------------------------------------------------- HPI Details Patient Name: [name] Date of Service: 02/03/2020 1:00 PM Medical Record Number: [id number] Patient Account Number: [id number] Date of Birth/Sex: 08-Nov-1929 (87 y.o. F) Treating RN: Cornell Barman Primary Care Provider: [name] Other Clinician: Referring Provider: Sharen Hones Treating Provider/Extender: Ricard Dillon Weeks in Treatment: 0 History of Present Illness HPI Description: ADMISSION 02/03/2020; This is an 87 year old woman who I think has had chronic edema in her lower legs for quite some time. She has compression stockings but has difficulty getting them on. Therefore she has not been wearing them. She developed a wound on the left anterior lower leg about a month ago. She apparently was seen by dermatology and compression stockings were provided but she really cannot get them on. She was hospitalized from 12/31/2019 through 01/03/2020 with rapid atrial fibrillation and left leg cellulitis. There was a previous culture of this area on 12/2 that showed MRSA. She was treated with vancomycin and Rocephin in the hospital then Augmentin and doxycycline after she was discharged. She was readmitted to hospital from 12/13 through 12/18 at which time she had rapid atrial fibrillation. During the second hospitalization she was noted to have chronic venous stasis with ulcers bilaterally worse on the left. A lower extremity Doppler was negative for  DVT, left tib-fib x-ray showed no evidence of osteomyelitis. She was again placed on antibiotics Keflex and doxycycline and a follow-up was made here. Past medical history includes paroxysmal atrial fibrillation, recurrent pelvic fractures times assuming secondary to osteoporosis, hypertension, hyperlipidemia, history of skin cancers including several extractions in her legs. She is followed by dermatology ABI in our clinic was  0.82 on the left Electronic Signature(s) Signed: 02/03/2020 4:24:20 PM By: Gretta Cool, BSN, RN, CWS, Kim RN, BSN Signed: 02/03/2020 5:39:11 PM By: Linton Ham MD Entered By: Gretta Cool, BSN, RN, CWS, Kim on 02/03/2020 16:24:20 [name] ([id number]) -------------------------------------------------------------------------------- Physical Exam Details Patient Name: LUBY, WINTRODE Date of Service: 02/03/2020 1:00 PM Medical Record Number: [id number] Patient Account Number: [id number] Date of Birth/Sex: 03/15/1929 (87 y.o. F) Treating RN: Cornell Barman Primary Care Provider: [name] Other Clinician: Referring Provider: Sharen Hones Treating Provider/Extender: Tito Dine in Treatment: 0 Constitutional Patient is hypertensive.. Pulse regular and within target range for patient.Marland Kitchen Respirations regular, non-labored and within target range.. Temperature is normal and within the target range for the patient.Marland Kitchen appears in no distress. Respiratory Respiratory effort is easy and symmetric bilaterally. Rate is normal at rest and on room air.. Shallow air entry but no crackles or wheezes. Cardiovascular Heart sounds fairly regular no murmurs JVP was not elevated. Needle pulses are palpable. Notes Wound exam; the patient's wound is on the left anterior lower leg very superficial and small. She has poorly controlled edema bilaterally which looks like a combination of chronic venous insufficiency probably some degree of lymphedema. The edema is mostly  pitting. Skin is very fragile on the lower extremities. She has a blister on it developing on the right anterior lower extremity Electronic Signature(s) Signed: 02/03/2020 4:24:26 PM By: Gretta Cool, BSN, RN, CWS, Kim RN, BSN Signed: 02/03/2020 5:39:11 PM By: Linton Ham MD Entered By: Gretta Cool, BSN, RN, CWS, Kim on 02/03/2020 16:24:26 [name] ([id number]) -------------------------------------------------------------------------------- Physician Orders Details Patient Name: [name] Date of Service: 02/03/2020 1:00 PM Medical Record Number: [id number] Patient Account Number: [id number] Date of Birth/Sex: 01-Jan-1930 (87 y.o. F) Treating RN: Cornell Barman Primary Care Provider: [name] Other Clinician: Referring Provider: Sharen Hones Treating Provider/Extender: Tito Dine in Treatment: 0 Verbal / Phone Orders: No Diagnosis Coding Follow-up Appointments o Return Appointment in 1 week. Bathing/ Shower/ Hygiene o May Shower with wound dressing protected (Water repellent cover e.g., cast protector). Non-Wound Condition Right Lower Extremity o Apply appropriate compression. - Remove compression stockings every night before going to bed and put on every morning when getting up. Additional Orders / Instructions o Follow Nutritious Diet o Increase protein intake. Wound Treatment Wound #1 - Lower Leg Wound Laterality: Left Primary Dressing: Silvercel Small 2x2 (in/in) Discharge Instructions: Apply Silvercel Small 2x2 (in/in) as instructed Secondary Dressing: ABD Pad 5x9 (in/in) Discharge Instructions: Cover with ABD pad Compression Wrap: Profore Lite LF 3 Multilayer Compression Bandaging System Discharge Instructions: Apply 3 multi-layer wrap as prescribed. Compression Stockings: Jobst Farrow Wrap 4000 (DME) Left Leg Compression Amount: 30-40 mmHG Right Leg Compression Amount: 30-40 mmHG Discharge Instructions: Apply to lower extremity as  directed. Notes Patient will pay out of pocket for right Wallie Char 4000 Electronic Signature(s) Signed: 02/03/2020 5:36:58 PM By: Gretta Cool, BSN, RN, CWS, Kim RN, BSN Signed: 02/03/2020 5:39:11 PM By: Linton Ham MD Entered By: Gretta Cool, BSN, RN, CWS, Kim on 02/03/2020 17:20:43 [name] ([id number]) -------------------------------------------------------------------------------- Problem List Details Patient Name: SALIMATA, WAIBEL. Date of Service: 02/03/2020 1:00 PM Medical Record Number: [id number] Patient Account Number: [id number] Date of Birth/Sex: 23-Aug-1929 (87 y.o. F) Treating RN: Cornell Barman Primary Care Provider: [name] Other Clinician: Referring Provider: Sharen Hones Treating Provider/Extender: Tito Dine in Treatment: 0 Active Problems ICD-10 Encounter Code Description Active Date MDM Diagnosis I87.312 Chronic venous hypertension (idiopathic) with ulcer of left lower 02/03/2020 No Yes extremity  [id number] Non-pressure chronic ulcer of other part of left lower leg limited to 02/03/2020 No Yes breakdown of skin I89.0 Lymphedema, not elsewhere classified 02/03/2020 No Yes Inactive Problems Resolved Problems Electronic Signature(s) Signed: 02/03/2020 3:56:52 PM By: Gretta Cool, BSN, RN, CWS, Kim RN, BSN Signed: 02/03/2020 5:39:11 PM By: Linton Ham MD Entered By: Gretta Cool, BSN, RN, CWS, Kim on 02/03/2020 15:56:51 [name] ([id number]) -------------------------------------------------------------------------------- Progress Note Details Patient Name: [name] Date of Service: 02/03/2020 1:00 PM Medical Record Number: [id number] Patient Account Number: [id number] Date of Birth/Sex: [birth year] (87 y.o. F) Treating RN: Cornell Barman Primary Care Provider: [name] Other Clinician: Referring Provider: Sharen Hones Treating Provider/Extender: Tito Dine in Treatment: 0 Subjective Chief Complaint Information obtained  from Patient 02/03/2020; patient is here for review of wounds on her left lower leg anteriorly History of Present Illness (HPI) ADMISSION 02/03/2020; This is an 87 year old woman who I think has had chronic edema in her lower legs for quite some time. She has compression stockings but has difficulty getting them on. Therefore she has not been wearing them. She developed a wound on the left anterior lower leg about a month ago. She apparently was seen by dermatology and compression stockings were provided but she really cannot get them on. She was hospitalized from 12/31/2019 through 01/03/2020 with rapid atrial fibrillation and left leg cellulitis. There was a previous culture of this area on 12/2 that showed MRSA. She was treated with vancomycin and Rocephin in the hospital then Augmentin and doxycycline after she was discharged. She was readmitted to hospital from 12/13 through 12/18 at which time she had rapid atrial fibrillation. During the second hospitalization she was noted to have chronic venous stasis with ulcers bilaterally worse on the left. A lower extremity Doppler was negative for DVT, left tib-fib x-ray showed no evidence of osteomyelitis. She was again placed on antibiotics Keflex and doxycycline and a follow-up was made here. Past medical history includes paroxysmal atrial fibrillation, recurrent pelvic fractures times assuming secondary to osteoporosis, hypertension, hyperlipidemia, history of skin cancers including several extractions in her legs. She is followed by dermatology ABI in our clinic was 0.82 on the left Patient History Information obtained from Patient. Allergies Bactrim, Cipro, pneumococcal vaccine, Polysporin Family History Cancer - Mother,Siblings, Hypertension - Mother,Siblings, Lung Disease - Siblings, No family history of Diabetes, Heart Disease, Hereditary Spherocytosis, Kidney Disease, Seizures, Stroke, Thyroid Problems, Tuberculosis. Social History Former  smoker, Marital Status - Widowed, Alcohol Use - Never, Drug Use - No History, Caffeine Use - Daily. Medical History Eyes Denies history of Cataracts, Glaucoma, Optic Neuritis Ear/Nose/Mouth/Throat Denies history of Chronic sinus problems/congestion, Middle ear problems Hematologic/Lymphatic Denies history of Anemia, Hemophilia, Human Immunodeficiency Virus, Lymphedema, Sickle Cell Disease Respiratory Denies history of Aspiration, Asthma, Chronic Obstructive Pulmonary Disease (COPD), Pneumothorax, Sleep Apnea, Tuberculosis Cardiovascular Patient has history of Hypertension Denies history of Angina, Arrhythmia, Congestive Heart Failure, Coronary Artery Disease, Deep Vein Thrombosis, Hypotension, Myocardial Infarction, Peripheral Arterial Disease, Peripheral Venous Disease, Phlebitis, Vasculitis Gastrointestinal Denies history of Cirrhosis , Colitis, Crohn s, Hepatitis A, Hepatitis B, Hepatitis C Endocrine Denies history of Type I Diabetes, Type II Diabetes Genitourinary Denies history of End Stage Renal Disease Immunological Denies history of Lupus Erythematosus, Raynaud s, Scleroderma Integumentary (Skin) Denies history of History of Burn, History of pressure wounds Musculoskeletal Denies history of Gout, Rheumatoid Arthritis, Osteoarthritis, Osteomyelitis Neurologic Denies history of Dementia, Neuropathy, Quadriplegia, Paraplegia, Seizure Disorder Oncologic KRYSTIAN, PEADEN ([id number]) Denies history of Received Chemotherapy, Received Radiation Psychiatric  Denies history of Anorexia/bulimia, Confinement Anxiety Review of Systems (ROS) Constitutional Symptoms (General Health) Denies complaints or symptoms of Fatigue, Fever, Chills, Marked Weight Change. Eyes Denies complaints or symptoms of Dry Eyes, Vision Changes, Glasses / Contacts. Ear/Nose/Mouth/Throat Denies complaints or symptoms of Difficult clearing ears, Sinusitis. Hematologic/Lymphatic Denies complaints or  symptoms of Bleeding / Clotting Disorders, Human Immunodeficiency Virus. Respiratory Denies complaints or symptoms of Chronic or frequent coughs, Shortness of Breath. Cardiovascular Denies complaints or symptoms of Chest pain, LE edema. Gastrointestinal Denies complaints or symptoms of Frequent diarrhea, Nausea, Vomiting. Endocrine Denies complaints or symptoms of Hepatitis, Thyroid disease, Polydypsia (Excessive Thirst). Genitourinary Denies complaints or symptoms of Kidney failure/ Dialysis, Incontinence/dribbling. Immunological Denies complaints or symptoms of Hives, Itching. Integumentary (Skin) Complains or has symptoms of Wounds, Swelling. Denies complaints or symptoms of Bleeding or bruising tendency, Breakdown. Musculoskeletal Denies complaints or symptoms of Muscle Pain, Muscle Weakness. Neurologic Denies complaints or symptoms of Numbness/parasthesias, Focal/Weakness. Psychiatric Denies complaints or symptoms of Anxiety, Claustrophobia. Objective Constitutional Patient is hypertensive.. Pulse regular and within target range for patient.Marland Kitchen Respirations regular, non-labored and within target range.. Temperature is normal and within the target range for the patient.Marland Kitchen appears in no distress. Vitals Time Taken: 1:22 PM, Height: 63 in, Source: Stated, Weight: 125 lbs, Source: Stated, BMI: 22.1, Temperature: 98.2 F, Pulse: 63 bpm, Respiratory Rate: 16 breaths/min, Blood Pressure: 144/79 mmHg. Respiratory Respiratory effort is easy and symmetric bilaterally. Rate is normal at rest and on room air.. Shallow air entry but no crackles or wheezes. Cardiovascular Heart sounds fairly regular no murmurs JVP was not elevated. Needle pulses are palpable. General Notes: Wound exam; the patient's wound is on the left anterior lower leg very superficial and small. She has poorly controlled edema bilaterally which looks like a combination of chronic venous insufficiency probably some degree of  lymphedema. The edema is mostly pitting. Skin is very fragile on the lower extremities. She has a blister on it developing on the right anterior lower extremity Integumentary (Hair, Skin) Wound #1 status is Open. Original cause of wound was Gradually Appeared. The wound is located on the Left Lower Leg. The wound measures 0.3cm length x 0.3cm width x 0.1cm depth; 0.071cm^2 area and 0.007cm^3 volume. There is Fat Layer (Subcutaneous Tissue) exposed. There is no tunneling noted. There is a medium amount of serosanguineous drainage noted. There is large (67-100%) red, pink granulation within the wound bed. There is no necrotic tissue within the wound bed. Other Condition(s) Patient presents with Lymphedema located on the Bilateral Leg. LOLENE, SHAMS ([id number]) Assessment Active Problems ICD-10 Chronic venous hypertension (idiopathic) with ulcer of left lower extremity Non-pressure chronic ulcer of other part of left lower leg limited to breakdown of skin Lymphedema, not elsewhere classified Procedures Wound #1 Pre-procedure diagnosis of Wound #1 is a Cellulitis located on the Left Lower Leg . There was a Three Layer Compression Therapy Procedure with a pre-treatment ABI of 0.8 by Cornell Barman, RN. Post procedure Diagnosis Wound #1: Same as Pre-Procedure Plan Follow-up Appointments: Return Appointment in 1 week. Bathing/ Shower/ Hygiene: May Shower with wound dressing protected (Water repellent cover e.g., cast protector). Edema Control - Lymphedema / Segmental Compressive Device / Other: Patient to wear own compression stockings. Remove compression stockings every night before going to bed and put on every morning when getting up. Non-Wound Condition: Apply appropriate compression. Additional Orders / Instructions: Follow Nutritious Diet Increase protein intake. General Notes: Patient will pay out of pocket for right Farrow 4000 WOUND #1: -  Lower Leg Wound Laterality:  Left Primary Dressing: Silvercel Small 2x2 (in/in) Discharge Instructions: Apply Silvercel Small 2x2 (in/in) as instructed Secondary Dressing: ABD Pad 5x9 (in/in) Discharge Instructions: Cover with ABD pad Compression Wrap: Profore Lite LF 3 Multilayer Compression Bandaging System Discharge Instructions: Apply 3 multi-layer wrap as prescribed. Compression Stockings: Jobst Farrow Wrap 4000 Compression Amount: 30-40 mmHg (left) Compression Amount: 30-40 mmHg (right) Discharge Instructions: Apply to lower extremity as directed. 1. We are going to apply silver cell/ABDs and 3 layer compression on the left leg she will leave this intact until next week. If she is going to shower she will need to get a cast protector to protect the dressing. 2. For her right leg she is definitely going to need a stocking which she apparently had from a month ago. Her son is here with her and he will be able to help put the stocking on the right leg 3. We have also ordered her juxta lite stockings which most older people find a little easier to get on and we have ordered these today, taken her measurements. 4. She has been treated extensively for cellulitis in the left lower leg, fortunately I do not see any evidence of this today no antibiotics were necessary no cultures were done 5. Very fragile skin in the lower extremities unless we can control the swelling here she is likely to have recurrent skin breakdown. I note during the hospitalizations in December there is suggestion that she had bilateral lower extremity wounds. She is certainly does not have that now I spent 35 minutes in review of this patient's past medical history, face-to-face evaluation and preparation of this record Electronic Signature(s) Signed: 02/03/2020 4:24:38 PM By: Gretta Cool, BSN, RN, CWS, Kim RN, BSN ELLORY, REARDEN ([id number]) Signed: 02/03/2020 5:39:11 PM By: Linton Ham MD Entered By: Gretta Cool, BSN, RN, CWS, Kim on 02/03/2020  16:24:38 [name] ([id number]) -------------------------------------------------------------------------------- ROS/PFSH Details Patient Name: [name] Date of Service: 02/03/2020 1:00 PM Medical Record Number: [id number] Patient Account Number: [id number] Date of Birth/Sex: 1929/10/27 (87 y.o. F) Treating RN: Carlene Coria Primary Care Provider: [name] Other Clinician: Referring Provider: Sharen Hones Treating Provider/Extender: Tito Dine in Treatment: 0 Information Obtained From Patient Constitutional Symptoms (General Health) Complaints and Symptoms: Negative for: Fatigue; Fever; Chills; Marked Weight Change Eyes Complaints and Symptoms: Negative for: Dry Eyes; Vision Changes; Glasses / Contacts Medical History: Negative for: Cataracts; Glaucoma; Optic Neuritis Ear/Nose/Mouth/Throat Complaints and Symptoms: Negative for: Difficult clearing ears; Sinusitis Medical History: Negative for: Chronic sinus problems/congestion; Middle ear problems Hematologic/Lymphatic Complaints and Symptoms: Negative for: Bleeding / Clotting Disorders; Human Immunodeficiency Virus Medical History: Negative for: Anemia; Hemophilia; Human Immunodeficiency Virus; Lymphedema; Sickle Cell Disease Respiratory Complaints and Symptoms: Negative for: Chronic or frequent coughs; Shortness of Breath Medical History: Negative for: Aspiration; Asthma; Chronic Obstructive Pulmonary Disease (COPD); Pneumothorax; Sleep Apnea; Tuberculosis Cardiovascular Complaints and Symptoms: Negative for: Chest pain; LE edema Medical History: Positive for: Hypertension Negative for: Angina; Arrhythmia; Congestive Heart Failure; Coronary Artery Disease; Deep Vein Thrombosis; Hypotension; Myocardial Infarction; Peripheral Arterial Disease; Peripheral Venous Disease; Phlebitis; Vasculitis Gastrointestinal Complaints and Symptoms: Negative for: Frequent diarrhea; Nausea;  Vomiting Medical History: Negative for: Cirrhosis ; Colitis; Crohnos; Hepatitis A; Hepatitis B; Hepatitis C Endocrine BENNYE, CILIBERTI ([id number]) Complaints and Symptoms: Negative for: Hepatitis; Thyroid disease; Polydypsia (Excessive Thirst) Medical History: Negative for: Type I Diabetes; Type II Diabetes Genitourinary Complaints and Symptoms: Negative for: Kidney failure/ Dialysis; Incontinence/dribbling Medical History: Negative for: End Stage Renal  Disease Immunological Complaints and Symptoms: Negative for: Hives; Itching Medical History: Negative for: Lupus Erythematosus; Raynaudos; Scleroderma Integumentary (Skin) Complaints and Symptoms: Positive for: Wounds; Swelling Negative for: Bleeding or bruising tendency; Breakdown Medical History: Negative for: History of Burn; History of pressure wounds Musculoskeletal Complaints and Symptoms: Negative for: Muscle Pain; Muscle Weakness Medical History: Negative for: Gout; Rheumatoid Arthritis; Osteoarthritis; Osteomyelitis Neurologic Complaints and Symptoms: Negative for: Numbness/parasthesias; Focal/Weakness Medical History: Negative for: Dementia; Neuropathy; Quadriplegia; Paraplegia; Seizure Disorder Psychiatric Complaints and Symptoms: Negative for: Anxiety; Claustrophobia Medical History: Negative for: Anorexia/bulimia; Confinement Anxiety Oncologic Medical History: Negative for: Received Chemotherapy; Received Radiation Immunizations Pneumococcal Vaccine: Received Pneumococcal Vaccination: No Implantable Devices None Family and Social History SHAWNNA, PANCAKE ([id number]) Cancer: Yes - Mother,Siblings; Diabetes: No; Heart Disease: No; Hereditary Spherocytosis: No; Hypertension: Yes - Mother,Siblings; Kidney Disease: No; Lung Disease: Yes - Siblings; Seizures: No; Stroke: No; Thyroid Problems: No; Tuberculosis: No; Former smoker; Marital Status - Widowed; Alcohol Use: Never; Drug Use: No History;  Caffeine Use: Daily; Financial Concerns: No; Food, Clothing or Shelter Needs: No; Support System Lacking: No; Transportation Concerns: No Engineer, maintenance) Signed: 02/03/2020 5:36:58 PM By: Gretta Cool, BSN, RN, CWS, Kim RN, BSN Signed: 02/03/2020 5:39:11 PM By: Linton Ham MD Signed: 02/05/2020 4:22:48 PM By: Carlene Coria RN Entered By: Gretta Cool, BSN, RN, CWS, Kim on 02/03/2020 15:52:57 [name] ([id number]) -------------------------------------------------------------------------------- [place] Details Patient Name: [name] Date of Service: 02/03/2020 Medical Record Number: [id number] Patient Account Number: [id number] Date of Birth/Sex: Jun 17, 1929 (87 y.o. F) Treating RN: Cornell Barman Primary Care Provider: [name] Other Clinician: Referring Provider: Sharen Hones Treating Provider/Extender: Tito Dine in Treatment: 0 Diagnosis Coding ICD-10 Codes Code Description [phone] Chronic venous hypertension (idiopathic) with ulcer of left lower extremity L97.821 Non-pressure chronic ulcer of other part of left lower leg limited to breakdown of skin I89.0 Lymphedema, not elsewhere classified Facility Procedures CPT4 Code: 62376283 Description: 99213 - WOUND CARE VISIT-LEV 3 EST PT Modifier: Quantity: 1 CPT4 Code: [id number] Description: (Facility Use Only) 29581LT - [place] LWR LT LEG Modifier: Quantity: 1 Physician Procedures CPT4 Code: 7371062 Description: WC PHYS LEVEL 3 o NEW PT Modifier: Quantity: 1 CPT4 Code: Description: ICD-10 Diagnosis Description I87.312 Chronic venous hypertension (idiopathic) with ulcer of left lower extremi L97.821 Non-pressure chronic ulcer of other part of left lower leg limited to bre I89.0 Lymphedema, not elsewhere classified Modifier: ty Lenise Herald of skin Quantity: Electronic Signature(s) Signed: 02/03/2020 5:06:11 PM By: Gretta Cool, BSN, RN, CWS, Kim RN, BSN Signed: 02/03/2020 5:39:11 PM By: Linton Ham MD Previous Signature: 02/03/2020 4:24:52 PM Version By: Gretta Cool, BSN, RN, CWS, Kim RN, BSN Previous Signature: 02/03/2020 3:56:26 PM Version By: Gretta Cool, BSN, RN, CWS, Kim RN, BSN Entered By: Gretta Cool, BSN, RN, CWS, Kim on 02/03/2020 17:06:11

## 2020-02-11 ENCOUNTER — Encounter: Payer: Medicare Other | Admitting: Physician Assistant

## 2020-02-11 ENCOUNTER — Other Ambulatory Visit: Payer: Self-pay

## 2020-02-11 DIAGNOSIS — L97821 Non-pressure chronic ulcer of other part of left lower leg limited to breakdown of skin: Secondary | ICD-10-CM | POA: Diagnosis not present

## 2020-02-11 NOTE — Progress Notes (Addendum)
[name], [name] ([id number]) Visit Report for 02/11/2020 Chief Complaint Document Details Patient Name: [name] [name], [name] [name]. Date of Service: 02/11/2020 1:30 PM Medical Record Number: [id number] Patient Account Number: [id number] Date of Birth/Sex: 08/07/1929 (87 y.o. F) Treating RN: Dolan Amen Primary Care Provider: [name] Other Clinician: Referring Provider: [name] Treating Provider/Extender: Skipper Cliche in Treatment: 1 Information Obtained from: Patient Chief Complaint 02/03/2020; patient is here for review of wounds on her left lower leg anteriorly Electronic Signature(s) Signed: 02/11/2020 2:15:54 PM By: Worthy Keeler PA-C Entered By: Worthy Keeler on 02/11/2020 14:15:54 [name] [name] ([id number]) -------------------------------------------------------------------------------- HPI Details Patient Name: [name] [name] Date of Service: 02/11/2020 1:30 PM Medical Record Number: [id number] Patient Account Number: [id number] Date of Birth/Sex: November 14, 1929 (87 y.o. F) Treating RN: Dolan Amen Primary Care Provider: [name] Other Clinician: Referring Provider: [name] Treating Provider/Extender: Skipper Cliche in Treatment: 1 History of Present Illness HPI Description: ADMISSION 02/03/2020; This is an 87 year old woman who I think has had chronic edema in her lower legs for quite some time. She has compression stockings but has difficulty getting them on. Therefore she has not been wearing them. She developed a wound on the left anterior lower leg about a month ago. She apparently was seen by dermatology and compression stockings were provided but she really cannot get them on. She was hospitalized from 12/31/2019 through 01/03/2020 with rapid atrial fibrillation and left leg cellulitis. There was a previous culture of this area on 12/2 that showed MRSA. She was treated with vancomycin and Rocephin in the  hospital then Augmentin and doxycycline after she was discharged. She was readmitted to hospital from 12/13 through 12/18 at which time she had rapid atrial fibrillation. During the second hospitalization she was noted to have chronic venous stasis with ulcers bilaterally worse on the left. A lower extremity Doppler was negative for DVT, left tib-fib x-ray showed no evidence of osteomyelitis. She was again placed on antibiotics Keflex and doxycycline and a follow-up was made here. Past medical history includes paroxysmal atrial fibrillation, recurrent pelvic fractures times assuming secondary to osteoporosis, hypertension, hyperlipidemia, history of skin cancers including several extractions in her legs. She is followed by dermatology ABI in our clinic was 0.82 on the left 02/11/2020 on evaluation today patient actually appears to be making good progress at this time in regard to her wounds. She has been tolerating the dressing changes without complication. Fortunately there is no evidence of active infection which is great news. No fevers, chills, nausea, vomiting, or diarrhea. She did get one of the juxta lites but has not received the other as of yet. They tell me that is the patient and her son that did pay for the second as well. Electronic Signature(s) Signed: 02/11/2020 3:13:23 PM By: Worthy Keeler PA-C Entered By: Worthy Keeler on 02/11/2020 15:13:23 [name] [name] ([id number]) -------------------------------------------------------------------------------- Physical Exam Details Patient Name: [name] [name] Date of Service: 02/11/2020 1:30 PM Medical Record Number: [id number] Patient Account Number: [id number] Date of Birth/Sex: 1929-08-18 (87 y.o. F) Treating RN: Dolan Amen Primary Care Provider: [name] Other Clinician: Referring Provider: [name] Treating Provider/Extender: Skipper Cliche in Treatment: 1 Constitutional Well-nourished and  well-hydrated in no acute distress. Respiratory labored, rapid respiration. Psychiatric this patient is able to make decisions and demonstrates good insight into disease process. Alert and Oriented x 3. pleasant and cooperative. Notes Upon inspection patient appears to be doing fairly well in regards to her wound in fact  this appears to be healed based on what I am seeing. This is brand-new skin and I think considering she does not have the compression wraps ready to go yet and does not have them with her today for the when she does have that we should probably use some Tubigrip for a week just to make sure everything remains close and see where things stand next week. The patient is in agreement with that plan as is her son. Subsequently I do think that once we get the check for lites on her that hopefully will help keep the edema under control. She also is having some issues with her breathing today which is somewhat labored and a little bit rapid. Apparently this has been going on since December when she was in the hospital. With that being said I think she needs to follow-up with her primary care provider as soon as possible in this regard Electronic Signature(s) Signed: 02/11/2020 3:14:18 PM By: Worthy Keeler PA-C Entered By: Worthy Keeler on 02/11/2020 15:14:18 [name] [name] ([id number]) -------------------------------------------------------------------------------- Physician Orders Details Patient Name: [name] [name] Date of Service: 02/11/2020 1:30 PM Medical Record Number: [id number] Patient Account Number: [id number] Date of Birth/Sex: August 05, 1929 (87 y.o. F) Treating RN: Dolan Amen Primary Care Provider: [name] Other Clinician: Referring Provider: [name] Treating Provider/Extender: Skipper Cliche in Treatment: 1 Verbal / Phone Orders: No Diagnosis Coding ICD-10 Coding Code Description [phone] Chronic venous hypertension  (idiopathic) with ulcer of left lower extremity L97.821 Non-pressure chronic ulcer of other part of left lower leg limited to breakdown of skin I89.0 Lymphedema, not elsewhere classified Follow-up Appointments o Return Appointment in 1 week. o Nurse Visit as needed Bathing/ Shower/ Hygiene o May shower; gently cleanse wound with antibacterial soap, rinse and pat dry prior to dressing wounds Edema Control - Lymphedema / Segmental Compressive Device / Other Left Lower Extremity o Elevate legs to the level of the heart and pump ankles as often as possible o Elevate leg(s) parallel to the floor when sitting. o Other: - Tubi grip E double layer Non-Wound Condition Bilateral Lower Extremities o Apply appropriate compression. Electronic Signature(s) Signed: 02/11/2020 3:15:58 PM By: Georges Mouse, Minus Breeding RN Signed: 02/12/2020 1:43:02 PM By: Worthy Keeler PA-C Entered By: Georges Mouse, Minus Breeding on 02/11/2020 15:05:34 [name] [name] ([id number]) -------------------------------------------------------------------------------- Problem List Details Patient Name: [name] [name] Date of Service: 02/11/2020 1:30 PM Medical Record Number: [id number] Patient Account Number: [id number] Date of Birth/Sex: August 28, 1929 (87 y.o. F) Treating RN: Dolan Amen Primary Care Provider: [name] Other Clinician: Referring Provider: [name] Treating Provider/Extender: Skipper Cliche in Treatment: 1 Active Problems ICD-10 Encounter Code Description Active Date MDM Diagnosis I87.312 Chronic venous hypertension (idiopathic) with ulcer of left lower 02/03/2020 No Yes extremity L97.821 Non-pressure chronic ulcer of other part of left lower leg limited to 02/03/2020 No Yes breakdown of skin I89.0 Lymphedema, not elsewhere classified 02/03/2020 No Yes Inactive Problems Resolved Problems Electronic Signature(s) Signed: 02/11/2020 2:15:39 PM By: Worthy Keeler  PA-C Entered By: Worthy Keeler on 02/11/2020 14:15:39 [name] [name] ([id number]) -------------------------------------------------------------------------------- Progress Note Details Patient Name: [name] [name] Date of Service: 02/11/2020 1:30 PM Medical Record Number: [id number] Patient Account Number: [id number] Date of Birth/Sex: May 28, 1929 (87 y.o. F) Treating RN: Dolan Amen Primary Care Provider: [name] Other Clinician: Referring Provider: [name] Treating Provider/Extender: Skipper Cliche in Treatment: 1 Subjective Chief Complaint Information obtained from Patient 02/03/2020; patient is here for review of wounds on her  left lower leg anteriorly History of Present Illness (HPI) ADMISSION 02/03/2020; This is an 87 year old woman who I think has had chronic edema in her lower legs for quite some time. She has compression stockings but has difficulty getting them on. Therefore she has not been wearing them. She developed a wound on the left anterior lower leg about a month ago. She apparently was seen by dermatology and compression stockings were provided but she really cannot get them on. She was hospitalized from 12/31/2019 through 01/03/2020 with rapid atrial fibrillation and left leg cellulitis. There was a previous culture of this area on 12/2 that showed MRSA. She was treated with vancomycin and Rocephin in the hospital then Augmentin and doxycycline after she was discharged. She was readmitted to hospital from 12/13 through 12/18 at which time she had rapid atrial fibrillation. During the second hospitalization she was noted to have chronic venous stasis with ulcers bilaterally worse on the left. A lower extremity Doppler was negative for DVT, left tib-fib x-ray showed no evidence of osteomyelitis. She was again placed on antibiotics Keflex and doxycycline and a follow-up was made here. Past medical history includes paroxysmal atrial  fibrillation, recurrent pelvic fractures times assuming secondary to osteoporosis, hypertension, hyperlipidemia, history of skin cancers including several extractions in her legs. She is followed by dermatology ABI in our clinic was 0.82 on the left 02/11/2020 on evaluation today patient actually appears to be making good progress at this time in regard to her wounds. She has been tolerating the dressing changes without complication. Fortunately there is no evidence of active infection which is great news. No fevers, chills, nausea, vomiting, or diarrhea. She did get one of the juxta lites but has not received the other as of yet. They tell me that is the patient and her son that did pay for the second as well. Objective Constitutional Well-nourished and well-hydrated in no acute distress. Vitals Time Taken: 1:45 PM, Height: 63 in, Weight: 125 lbs, BMI: 22.1, Temperature: 98.1 F, Pulse: 73 bpm, Respiratory Rate: 18 breaths/min, Blood Pressure: 152/82 mmHg. Respiratory labored, rapid respiration. Psychiatric this patient is able to make decisions and demonstrates good insight into disease process. Alert and Oriented x 3. pleasant and cooperative. General Notes: Upon inspection patient appears to be doing fairly well in regards to her wound in fact this appears to be healed based on what I am seeing. This is brand-new skin and I think considering she does not have the compression wraps ready to go yet and does not have them with her today for the when she does have that we should probably use some Tubigrip for a week just to make sure everything remains close and see where things stand next week. The patient is in agreement with that plan as is her son. Subsequently I do think that once we get the check for lites on her that hopefully will help keep the edema under control. She also is having some issues with her breathing today which is somewhat labored and a little bit rapid. Apparently this  has been going on since December when she was in the hospital. With that being said I think she needs to follow-up with her primary care provider as soon as possible in this regard Integumentary (Hair, Skin) Wound #1 status is Healed - Epithelialized. Original cause of wound was Gradually Appeared. The wound is located on the Left Lower Leg. The wound measures 0cm length x 0cm width x 0cm depth; 0cm^2 area and 0cm^3 volume.  There is no tunneling or undermining noted. There is a ANELLA, MELLAS ([id number]) none present amount of drainage noted. There is no granulation within the wound bed. There is no necrotic tissue within the wound bed. Assessment Active Problems ICD-10 Chronic venous hypertension (idiopathic) with ulcer of left lower extremity Non-pressure chronic ulcer of other part of left lower leg limited to breakdown of skin Lymphedema, not elsewhere classified Plan Follow-up Appointments: Return Appointment in 1 week. Nurse Visit as needed Bathing/ Shower/ Hygiene: May shower; gently cleanse wound with antibacterial soap, rinse and pat dry prior to dressing wounds Edema Control - Lymphedema / Segmental Compressive Device / Other: Elevate legs to the level of the heart and pump ankles as often as possible Elevate leg(s) parallel to the floor when sitting. Other: - Tubi grip E double layer Non-Wound Condition: Apply appropriate compression. 1. Recommend at this time that we have the patient discontinue wound care measures currently I do not think she needs any specific medication to would recommend Tubigrip to help with edema control until we can get her into juxta lite wraps. 2. Also do recommend at this time that we have the patient continue to monitor for any signs of worsening infection Or pain. There does not appear to be an issue with that currently but nonetheless this is something we need to keep a close eye on in my opinion. We will see patient back for reevaluation  in 1 week here in the clinic. If anything worsens or changes patient will contact our office for additional recommendations. If she is still doing next week as well as she is currently I think she will be ready for discharge after teaching her how to use the juxta lite wraps. Electronic Signature(s) Signed: 02/11/2020 3:15:31 PM By: Worthy Keeler PA-C Entered By: Worthy Keeler on 02/11/2020 15:15:31 [name] [name] ([id number]) -------------------------------------------------------------------------------- SuperBill Details Patient Name: [name] [name] Date of Service: 02/11/2020 Medical Record Number: [id number] Patient Account Number: [id number] Date of Birth/Sex: 01-04-1930 (87 y.o. F) Treating RN: Dolan Amen Primary Care Provider: [name] Other Clinician: Referring Provider: [name] Treating Provider/Extender: Skipper Cliche in Treatment: 1 Diagnosis Coding ICD-10 Codes Code Description [phone] Chronic venous hypertension (idiopathic) with ulcer of left lower extremity L97.821 Non-pressure chronic ulcer of other part of left lower leg limited to breakdown of skin I89.0 Lymphedema, not elsewhere classified Facility Procedures CPT4 Code: [id number] Description: 99213 - WOUND CARE VISIT-LEV 3 EST PT Modifier: Quantity: 1 Physician Procedures CPT4 Code: [id number] Description: [id number] - WC PHYS LEVEL 3 - EST PT Modifier: Quantity: 1 CPT4 Code: Description: ICD-10 Diagnosis Description I87.312 Chronic venous hypertension (idiopathic) with ulcer of left lower extremi L97.821 Non-pressure chronic ulcer of other part of left lower leg limited to bre I89.0 Lymphedema, not elsewhere classified Modifier: ty akdown of skin Quantity: Electronic Signature(s) Signed: 02/11/2020 3:15:46 PM By: Worthy Keeler PA-C Entered By: Worthy Keeler on 02/11/2020 15:15:45

## 2020-02-11 NOTE — Progress Notes (Addendum)
[name] [name], [name] [name] ([id number]) Visit Report for 02/11/2020 Arrival Information Details Patient Name: [name] [name], [name] [name]. Date of Service: 02/11/2020 1:30 PM Medical Record Number: [id number] Patient Account Number: [id number] Date of Birth/Sex: 1929-10-09 (87 y.o. F) Treating RN: Dolan Amen Primary Care Memorie Yokoyama: [name] Other Clinician: Referring Paiton Boultinghouse: [name] Treating Theador Jezewski/Extender: Skipper Cliche in Treatment: 1 Visit Information History Since Last Visit Added or deleted any medications: No Patient Arrived: [name] [name] Any new allergies or adverse reactions: No Arrival Time: 13:48 Had a fall or experienced change in No Accompanied By: son activities of daily living that may affect Transfer Assistance: None risk of falls: Patient Identification Verified: Yes Signs or symptoms of abuse/neglect since last visito No Secondary Verification Process Completed: Yes Hospitalized since last visit: No Patient Requires Transmission-Based No Implantable device outside of the clinic excluding No Precautions: cellular tissue based products placed in the center Patient Has Alerts: Yes since last visit: Patient Alerts: Patient on Blood Has Dressing in Place as Prescribed: Yes Thinner Has Compression in Place as Prescribed: Yes Xalerto Pain Present Now: No Electronic Signature(s) Signed: 03/14/2020 4:09:53 PM By: Carlene Coria RN Previous Signature: 02/11/2020 3:42:54 PM Version By: Lorine Bears RCP, RRT, CHT Entered By: Carlene Coria on 02/17/2020 14:41:51 [name] [name] ([id number]) -------------------------------------------------------------------------------- Clinic Level of Care Assessment Details Patient Name: [name] [name] Date of Service: 02/11/2020 1:30 PM Medical Record Number: [id number] Patient Account Number: [id number] Date of Birth/Sex: 08/23/1929 (87 y.o. F) Treating RN: Dolan Amen Primary Care Myishia Kasik:  [name] Other Clinician: Referring Brienna Bass: [name] Treating Dayshon Roback/Extender: Skipper Cliche in Treatment: 1 Clinic Level of Care Assessment Items TOOL 4 Quantity Score X - Use when only an EandM is performed on FOLLOW-UP visit 1 0 ASSESSMENTS - Nursing Assessment / Reassessment X - Reassessment of Co-morbidities (includes updates in patient status) 1 10 X- 1 5 Reassessment of Adherence to Treatment Plan ASSESSMENTS - Wound and Skin Assessment / Reassessment X - Simple Wound Assessment / Reassessment - one wound 1 5 []  - 0 Complex Wound Assessment / Reassessment - multiple wounds []  - 0 Dermatologic / Skin Assessment (not related to wound area) ASSESSMENTS - Focused Assessment X - Circumferential Edema Measurements - multi extremities 1 5 []  - 0 Nutritional Assessment / Counseling / Intervention []  - 0 Lower Extremity Assessment (monofilament, tuning fork, pulses) []  - 0 Peripheral Arterial Disease Assessment (using hand held doppler) ASSESSMENTS - Ostomy and/or Continence Assessment and Care []  - Incontinence Assessment and Management 0 []  - 0 Ostomy Care Assessment and Management (repouching, etc.) PROCESS - Coordination of Care X - Simple Patient / Family Education for ongoing care 1 15 []  - 0 Complex (extensive) Patient / Family Education for ongoing care []  - 0 Staff obtains Programmer, systems, Records, Test Results / Process Orders []  - 0 Staff telephones HHA, Nursing Homes / Clarify orders / etc []  - 0 Routine Transfer to another Facility (non-emergent condition) []  - 0 Routine Hospital Admission (non-emergent condition) []  - 0 New Admissions / Biomedical engineer / Ordering NPWT, Apligraf, etc. []  - 0 Emergency Hospital Admission (emergent condition) X- 1 10 Simple Discharge Coordination []  - 0 Complex (extensive) Discharge Coordination PROCESS - Special Needs []  - Pediatric / Minor Patient Management 0 []  - 0 Isolation Patient  Management []  - 0 Hearing / Language / Visual special needs []  - 0 Assessment of Community assistance (transportation, D/C planning, etc.) []  - 0 Additional assistance / Altered mentation []  - 0 Support Surface(s) Assessment (bed,  cushion, seat, etc.) INTERVENTIONS - Wound Cleansing / Measurement [name] [name], [name] [name] ([id number]) X- 1 5 Simple Wound Cleansing - one wound []  - 0 Complex Wound Cleansing - multiple wounds X- 1 5 Wound Imaging (photographs - any number of wounds) []  - 0 Wound Tracing (instead of photographs) X- 1 5 Simple Wound Measurement - one wound []  - 0 Complex Wound Measurement - multiple wounds INTERVENTIONS - Wound Dressings X - Small Wound Dressing one or multiple wounds 1 10 []  - 0 Medium Wound Dressing one or multiple wounds []  - 0 Large Wound Dressing one or multiple wounds []  - 0 Application of Medications - topical []  - 0 Application of Medications - injection INTERVENTIONS - Miscellaneous []  - External ear exam 0 []  - 0 Specimen Collection (cultures, biopsies, blood, body fluids, etc.) []  - 0 Specimen(s) / Culture(s) sent or taken to Lab for analysis []  - 0 Patient Transfer (multiple staff / Civil Service fast streamer / Similar devices) []  - 0 Simple Staple / Suture removal (25 or less) []  - 0 Complex Staple / Suture removal (26 or more) []  - 0 Hypo / Hyperglycemic Management (close monitor of Blood Glucose) []  - 0 Ankle / Brachial Index (ABI) - do not check if billed separately X- 1 5 Vital Signs Has the patient been seen at the hospital within the last three years: Yes Total Score: 80 Level Of Care: New/Established - Level 3 Electronic Signature(s) Signed: 02/11/2020 3:15:58 PM By: Georges Mouse, Minus Breeding RN Entered By: Georges Mouse, Kenia on 02/11/2020 15:06:14 [name] [name] ([id number]) -------------------------------------------------------------------------------- Encounter Discharge Information Details Patient Name: [name] [name] Date of Service: 02/11/2020 1:30 PM Medical Record Number: [id number] Patient Account Number: [id number] Date of Birth/Sex: May 04, 1929 (87 y.o. F) Treating RN: Dolan Amen Primary Care Halsey Persaud: [name] Other Clinician: Referring Leshawn Houseworth: [name] Treating Ahyana Skillin/Extender: Skipper Cliche in Treatment: 1 Encounter Discharge Information Items Discharge Condition: Stable Ambulatory Status: Walker Discharge Destination: Home Transportation: Private Auto Accompanied By: son Schedule Follow-up Appointment: Yes Clinical Summary of Care: Electronic Signature(s) Signed: 02/11/2020 3:15:58 PM By: Georges Mouse, Minus Breeding RN Entered By: Georges Mouse, Minus Breeding on 02/11/2020 15:09:06 [name] [name] ([id number]) -------------------------------------------------------------------------------- Lower Extremity Assessment Details Patient Name: [name] [name] Date of Service: 02/11/2020 1:30 PM Medical Record Number: [id number] Patient Account Number: [id number] Date of Birth/Sex: 01-07-1930 (87 y.o. F) Treating RN: Dolan Amen Primary Care Khali Perella: [name] Other Clinician: Referring Jakolby Sedivy: [name] Treating Cherylin Waguespack/Extender: Jeri Cos Weeks in Treatment: 1 Edema Assessment Assessed: [Left: Yes] [Right: Yes] Edema: [Left: Yes] [Right: Yes] Calf Left: Right: Point of Measurement: 30 cm From Medial Instep 31.5 cm 34 cm Ankle Left: Right: Point of Measurement: 10 cm From Medial Instep 19 cm 24 cm Vascular Assessment Pulses: Dorsalis Pedis Palpable: [Left:Yes] Electronic Signature(s) Signed: 02/11/2020 3:15:58 PM By: Charlett Nose RN Signed: 02/11/2020 3:42:54 PM By: Lorine Bears RCP, RRT, CHT Entered By: Lorine Bears on 02/11/2020 14:11:35 [name] [name] ([id number]) -------------------------------------------------------------------------------- Multi Wound Chart  Details Patient Name: [name] [name] Date of Service: 02/11/2020 1:30 PM Medical Record Number: [id number] Patient Account Number: [id number] Date of Birth/Sex: [birth year] (87 y.o. F) Treating RN: Dolan Amen Primary Care Hector Venne: [name] Other Clinician: Referring Anaelle Dunton: [name] Treating Alize Acy/Extender: Skipper Cliche in Treatment: 1 Vital Signs Height(in): 63 Pulse(bpm): 26 Weight(lbs): 125 Blood Pressure(mmHg): 152/82 Body Mass Index(BMI): 22 Temperature(F): 98.1 Respiratory Rate(breaths/min): 18 Photos: [1:No Photos] [N/A:N/A] Wound Location: [1:Left Lower Leg] [N/A:N/A] Wounding Event: [1:Gradually Appeared] [N/A:N/A] Primary Etiology: [  1:Venous Leg Ulcer] [N/A:N/A] Date Acquired: [1:12/30/2019] [N/A:N/A] Weeks of Treatment: [1:1] [N/A:N/A] Wound Status: [1:Open] [N/A:N/A] Measurements L x W x D (cm) [1:0.1x0.1x0.1] [N/A:N/A] Area (cm) : [7:8.295] [N/A:N/A] Volume (cm) : [1:0.001] [N/A:N/A] % Reduction in Area: [1:88.70%] [N/A:N/A] % Reduction in Volume: [1:85.70%] [N/A:N/A] Classification: [1:Full Thickness Without Exposed Support Structures] [N/A:N/A] Treatment Notes Electronic Signature(s) Signed: 02/11/2020 3:15:58 PM By: Georges Mouse, Minus Breeding RN Entered By: Georges Mouse, Minus Breeding on 02/11/2020 14:20:55 [name] [name] ([id number]) -------------------------------------------------------------------------------- [place] Details Patient Name: [name] [name] Date of Service: 02/11/2020 1:30 PM Medical Record Number: [id number] Patient Account Number: [id number] Date of Birth/Sex: 31-Jan-1929 (87 y.o. F) Treating RN: Dolan Amen Primary Care Rayleigh Gillyard: [name] Other Clinician: Referring Bazil Dhanani: [name] Treating Ivie Savitt/Extender: Skipper Cliche in Treatment: 1 Active Inactive Electronic Signature(s) Signed: 02/11/2020 3:15:58 PM By: Georges Mouse, Minus Breeding  RN Entered By: Georges Mouse, Minus Breeding on 02/11/2020 15:04:52 [name] [name] ([id number]) -------------------------------------------------------------------------------- Pain Assessment Details Patient Name: [name] [name] Date of Service: 02/11/2020 1:30 PM Medical Record Number: [id number] Patient Account Number: [id number] Date of Birth/Sex: Apr 18, 1929 (87 y.o. F) Treating RN: Dolan Amen Primary Care Denym Rahimi: [name] Other Clinician: Referring Zlaty Alexa: [name] Treating Jenniferann Stuckert/Extender: Skipper Cliche in Treatment: 1 Active Problems Location of Pain Severity and Description of Pain Patient Has Paino No Site Locations Rate the pain. Current Pain Level: 0 Pain Management and Medication Current Pain Management: Electronic Signature(s) Signed: 02/11/2020 3:15:58 PM By: Georges Mouse, Minus Breeding RN Signed: 02/11/2020 3:42:54 PM By: Lorine Bears RCP, RRT, CHT Entered By: Lorine Bears on 02/11/2020 13:59:09 [name] [name] ([id number]) -------------------------------------------------------------------------------- Patient/Caregiver Education Details Patient Name: [name] [name] Date of Service: 02/11/2020 1:30 PM Medical Record Number: [id number] Patient Account Number: [id number] Date of Birth/Gender: [birth year] (87 y.o. F) Treating RN: Dolan Amen Primary Care Physician: [name] Other Clinician: Referring Physician: [name] Treating Physician/Extender: Skipper Cliche in Treatment: 1 Education Assessment Education Provided To: Patient Education Topics Provided Notes educated on importance of edema control-leg elevation, wearing compression stockings Electronic Signature(s) Signed: 02/11/2020 3:15:58 PM By: Georges Mouse, Minus Breeding RN Entered By: Georges Mouse, Minus Breeding on 02/11/2020 15:07:30 [name] [name]  ([id number]) -------------------------------------------------------------------------------- Wound Assessment Details Patient Name: [name] [name] Date of Service: 02/11/2020 1:30 PM Medical Record Number: [id number] Patient Account Number: [id number] Date of Birth/Sex: 1929/09/05 (87 y.o. F) Treating RN: Dolan Amen Primary Care Terrie Grajales: [name] Other Clinician: Referring Britta Louth: [name] Treating Jeffery Gammell/Extender: Jeri Cos Weeks in Treatment: 1 Wound Status Wound Number: 1 Primary Etiology: Venous Leg Ulcer Wound Location: Left Lower Leg Wound Status: Healed - Epithelialized Wounding Event: Gradually Appeared Comorbid History: Hypertension Date Acquired: 12/30/2019 Weeks Of Treatment: 1 Clustered Wound: No Photos Photo Uploaded By: Georges Mouse, Minus Breeding on 02/11/2020 15:18:19 Wound Measurements Length: (cm) 0 Width: (cm) 0 Depth: (cm) 0 Area: (cm) 0 Volume: (cm) 0 % Reduction in Area: 100% % Reduction in Volume: 100% Epithelialization: Large (67-100%) Tunneling: No Undermining: No Wound Description Classification: Partial Thickness Exudate Amount: None Present Foul Odor After Cleansing: No Slough/Fibrino No Wound Bed Granulation Amount: None Present (0%) Exposed Structure Necrotic Amount: None Present (0%) Fascia Exposed: No Fat Layer (Subcutaneous Tissue) Exposed: No Tendon Exposed: No Muscle Exposed: No Joint Exposed: No Bone Exposed: No Treatment Notes Wound #1 (Lower Leg) Wound Laterality: Left Cleanser Peri-Wound Care Topical Primary Dressing [name] [name], [name] [name] ([id number]) Secondary Dressing Secured With Compression Wrap Compression Stockings Add-Ons Electronic Signature(s) Signed: 02/11/2020 3:15:58 PM By: Charlett Nose RN Entered  By: Georges Mouse, Minus Breeding on 02/11/2020 14:25:51 [name] [name]  ([id number]) -------------------------------------------------------------------------------- Vitals Details Patient Name: [name] [name] Date of Service: 02/11/2020 1:30 PM Medical Record Number: [id number] Patient Account Number: [id number] Date of Birth/Sex: Jan 08, 1930 (87 y.o. F) Treating RN: Dolan Amen Primary Care Nero Sawatzky: [name] Other Clinician: Referring Lourie Retz: [name] Treating Toney Lizaola/Extender: Skipper Cliche in Treatment: 1 Vital Signs Time Taken: 13:45 Temperature (F): 98.1 Height (in): 63 Pulse (bpm): 73 Weight (lbs): 125 Respiratory Rate (breaths/min): 18 Body Mass Index (BMI): 22.1 Blood Pressure (mmHg): 152/82 Reference Range: 80 - 120 mg / dl Electronic Signature(s) Signed: 02/11/2020 3:42:54 PM By: Lorine Bears RCP, RRT, CHT Entered By: Lorine Bears on 02/11/2020 13:58:44

## 2020-02-17 ENCOUNTER — Other Ambulatory Visit: Payer: Self-pay

## 2020-02-17 ENCOUNTER — Encounter: Payer: Medicare Other | Admitting: Internal Medicine

## 2020-02-17 DIAGNOSIS — L97821 Non-pressure chronic ulcer of other part of left lower leg limited to breakdown of skin: Secondary | ICD-10-CM | POA: Diagnosis not present

## 2020-02-19 NOTE — Progress Notes (Signed)
[name] [name] ([id number]) Visit Report for 02/17/2020 Arrival Information Details Patient Name: [name], [name]. Date of Service: 02/17/2020 1:30 PM Medical Record Number: [id number] Patient Account Number: [id number] Date of Birth/Sex: Oct 06, 1929 (87 y.o. F) Treating RN: Carlene Coria Primary Care Blessing Ozga: [name] Other Clinician: Referring Raeanne Deschler: [name] Treating Vinal Rosengrant/Extender: Tito Dine in Treatment: 2 Visit Information History Since Last Visit All ordered tests and consults were completed: No Patient Arrived: Gilford Rile Added or deleted any medications: No Arrival Time: 13:50 Any new allergies or adverse reactions: No Accompanied By: son Had a fall or experienced change in No Transfer Assistance: None activities of daily living that may affect Patient Identification Verified: Yes risk of falls: Secondary Verification Process Completed: Yes Signs or symptoms of abuse/neglect since last visito No Patient Requires Transmission-Based No Hospitalized since last visit: No Precautions: Implantable device outside of the clinic excluding No Patient Has Alerts: Yes cellular tissue based products placed in the center Patient Alerts: Patient on Blood since last visit: Thinner Pain Present Now: No Electronic Signature(s) Signed: 02/19/2020 10:25:07 AM By: Carlene Coria RN Entered By: Carlene Coria on 02/17/2020 13:55:44 [name] [name] ([id number]) -------------------------------------------------------------------------------- Compression Therapy Details Patient Name: [name] [name] Date of Service: 02/17/2020 1:30 PM Medical Record Number: [id number] Patient Account Number: [id number] Date of Birth/Sex: August 04, 1929 (87 y.o. F) Treating RN: Carlene Coria Primary Care Jenean Escandon: [name] Other Clinician: Referring Mabrey Howland: [name] Treating Shaurya Rawdon/Extender: Tito Dine in Treatment:  2 Compression Therapy Performed for Wound Assessment: NonWound Condition Lymphedema - Bilateral Leg Performed By: Clinician Carlene Coria, RN Compression Type: Three Layer Pre Treatment ABI: 0.8 Post Procedure Diagnosis Same as Pre-procedure Electronic Signature(s) Signed: 02/19/2020 10:25:07 AM By: Carlene Coria RN Entered By: Carlene Coria on 02/17/2020 14:43:29 [name] [name] ([id number]) -------------------------------------------------------------------------------- Encounter Discharge Information Details Patient Name: [name] [name] Date of Service: 02/17/2020 1:30 PM Medical Record Number: [id number] Patient Account Number: [id number] Date of Birth/Sex: 1929-12-15 (87 y.o. F) Treating RN: Dolan Amen Primary Care Seri Kimmer: [name] Other Clinician: Referring Marygrace Sandoval: [name] Treating Keoni Risinger/Extender: Tito Dine in Treatment: 2 Encounter Discharge Information Items Discharge Condition: Stable Ambulatory Status: Walker Discharge Destination: Home Transportation: Private Auto Accompanied By: son Schedule Follow-up Appointment: Yes Clinical Summary of Care: Electronic Signature(s) Signed: 02/17/2020 5:40:48 PM By: Georges Mouse, Minus Breeding RN Entered By: Georges Mouse, Minus Breeding on 02/17/2020 15:10:24 [name] [name] ([id number]) -------------------------------------------------------------------------------- Lower Extremity Assessment Details Patient Name: [name] [name] Date of Service: 02/17/2020 1:30 PM Medical Record Number: [id number] Patient Account Number: [id number] Date of Birth/Sex: 1929-03-22 (87 y.o. F) Treating RN: Carlene Coria Primary Care Daphyne Miguez: [name] Other Clinician: Referring Kindra Bickham: [name] Treating Casady Voshell/Extender: Ricard Dillon Weeks in Treatment: 2 Edema Assessment Assessed: [Left: No] [Right: No] [Left: Edema] [Right: :] Calf Left: Right: Point of  Measurement: From Medial Instep 30 cm 34 cm Ankle Left: Right: Point of Measurement: From Medial Instep 20 cm 22 cm Electronic Signature(s) Signed: 02/19/2020 10:25:07 AM By: Carlene Coria RN Entered By: Carlene Coria on 02/17/2020 13:59:14 [name] [name] ([id number]) -------------------------------------------------------------------------------- Multi Wound Chart Details Patient Name: [name] [name] Date of Service: 02/17/2020 1:30 PM Medical Record Number: [id number] Patient Account Number: [id number] Date of Birth/Sex: Jul 02, 1929 (87 y.o. F) Treating RN: Carlene Coria Primary Care Anora Schwenke: [name] Other Clinician: Referring Zarriah Starkel: [name] Treating Remedy Corporan/Extender: Tito Dine in Treatment: 2 Vital Signs Height(in): 63 Pulse(bpm): 67 Weight(lbs): 125 Blood Pressure(mmHg): 151/70 Body Mass Index(BMI): 22 Temperature(F): 98.3 Respiratory  Rate(breaths/min): 18 Wound Assessments Treatment Notes Electronic Signature(s) Signed: 02/18/2020 1:16:15 PM By: Linton Ham MD Entered By: Linton Ham on 02/17/2020 15:15:35 [name] [name] ([id number]) -------------------------------------------------------------------------------- [place] Details Patient Name: [name] [name] Date of Service: 02/17/2020 1:30 PM Medical Record Number: [id number] Patient Account Number: [id number] Date of Birth/Sex: [birth year] (87 y.o. F) Treating RN: Carlene Coria Primary Care Jamecia Lerman: [name] Other Clinician: Referring Shandi Godfrey: [name] Treating Nitara Szczerba/Extender: Tito Dine in Treatment: 2 Active Inactive Electronic Signature(s) Signed: 02/19/2020 10:25:07 AM By: Carlene Coria RN Entered By: Carlene Coria on 02/17/2020 14:43:04 [name] [name] ([id number]) -------------------------------------------------------------------------------- Pain Assessment Details Patient Name:  [name] [name] Date of Service: 02/17/2020 1:30 PM Medical Record Number: [id number] Patient Account Number: [id number] Date of Birth/Sex: 01-14-1930 (87 y.o. F) Treating RN: Carlene Coria Primary Care Alphonsa Brickle: [name] Other Clinician: Referring Eddy Liszewski: [name] Treating Diana Davenport/Extender: Tito Dine in Treatment: 2 Active Problems Location of Pain Severity and Description of Pain Patient Has Paino No Site Locations Pain Management and Medication Current Pain Management: Electronic Signature(s) Signed: 02/19/2020 10:25:07 AM By: Carlene Coria RN Entered By: Carlene Coria on 02/17/2020 13:56:19 [name] [name] ([id number]) -------------------------------------------------------------------------------- Patient/Caregiver Education Details Patient Name: [name] [name] Date of Service: 02/17/2020 1:30 PM Medical Record Number: [id number] Patient Account Number: [id number] Date of Birth/Gender: 17-Jan-1930 (87 y.o. F) Treating RN: Carlene Coria Primary Care Physician: [name] Other Clinician: Referring Physician: [name] Treating Physician/Extender: Tito Dine in Treatment: 2 Education Assessment Education Provided To: Patient Education Topics Provided Venous: Handouts: Controlling Swelling with Multilayered Compression Wraps Methods: Demonstration, Explain/Verbal Responses: State content correctly Wound/Skin Impairment: Handouts: Caring for Your Ulcer Methods: Demonstration, Explain/Verbal Responses: State content correctly Electronic Signature(s) Signed: 02/19/2020 10:25:07 AM By: Carlene Coria RN Entered By: Carlene Coria on 02/17/2020 14:47:43 [name] [name] ([id number]) -------------------------------------------------------------------------------- Vitals Details Patient Name: [name] [name] Date of Service: 02/17/2020 1:30 PM Medical Record Number: [id number] Patient Account  Number: [id number] Date of Birth/Sex: 03-09-1929 (87 y.o. F) Treating RN: Carlene Coria Primary Care Evvie Behrmann: [name] Other Clinician: Referring Dequavion Follette: [name] Treating Daylah Sayavong/Extender: Tito Dine in Treatment: 2 Vital Signs Time Taken: 13:55 Temperature (F): 98.3 Height (in): 63 Pulse (bpm): 67 Weight (lbs): 125 Respiratory Rate (breaths/min): 18 Body Mass Index (BMI): 22.1 Blood Pressure (mmHg): 151/70 Reference Range: 80 - 120 mg / dl Electronic Signature(s) Signed: 02/19/2020 10:25:07 AM By: Carlene Coria RN Entered By: Carlene Coria on 02/17/2020 13:56:08

## 2020-02-19 NOTE — Progress Notes (Signed)
[name] [name], [name] [name] ([id number]) Visit Report for 02/17/2020 HPI Details Patient Name: [name] [name], [name] [name]. Date of Service: 02/17/2020 1:30 PM Medical Record Number: [id number] Patient Account Number: [id number] Date of Birth/Sex: 08-15-1929 (87 y.o. F) Treating RN: Cornell Barman Primary Care Provider: [name] Other Clinician: Referring Provider: [name] Treating Provider/Extender: Tito Dine in Treatment: 2 History of Present Illness HPI Description: ADMISSION 02/03/2020; This is an 87 year old woman who I think has had chronic edema in her lower legs for quite some time. She has compression stockings but has difficulty getting them on. Therefore she has not been wearing them. She developed a wound on the left anterior lower leg about a month ago. She apparently was seen by dermatology and compression stockings were provided but she really cannot get them on. She was hospitalized from 12/31/2019 through 01/03/2020 with rapid atrial fibrillation and left leg cellulitis. There was a previous culture of this area on 12/2 that showed MRSA. She was treated with vancomycin and Rocephin in the hospital then Augmentin and doxycycline after she was discharged. She was readmitted to hospital from 12/13 through 12/18 at which time she had rapid atrial fibrillation. During the second hospitalization she was noted to have chronic venous stasis with ulcers bilaterally worse on the left. A lower extremity Doppler was negative for DVT, left tib-fib x-ray showed no evidence of osteomyelitis. She was again placed on antibiotics Keflex and doxycycline and a follow-up was made here. Past medical history includes paroxysmal atrial fibrillation, recurrent pelvic fractures times assuming secondary to osteoporosis, hypertension, hyperlipidemia, history of skin cancers including several extractions in her legs. She is followed by dermatology ABI in our clinic was 0.82 on the  left 02/11/2020 on evaluation today patient actually appears to be making good progress at this time in regard to her wounds. She has been tolerating the dressing changes without complication. Fortunately there is no evidence of active infection which is great news. No fevers, chills, nausea, vomiting, or diarrhea. She did get one of the juxta lites but has not received the other as of yet. They tell me that is the patient and her son that did pay for the second as well. 1/19; this is a patient with chronic venous insufficiency wounds on her left leg. This is closed over. The edema in her leg is reasonably well controlled except for the anterior lower legs bilaterally. Here there is localized subcutaneous swelling which almost has the consistency of a large blister. I do not recall it looking like this 2 weeks ago. She has 1 Farrow wrap 2000 and another 1 is apparently on the way. She has no open wounds today Electronic Signature(s) Signed: 02/18/2020 1:16:15 PM By: Linton Ham MD Entered By: Linton Ham on 02/17/2020 15:17:15 [name] [name] ([id number]) -------------------------------------------------------------------------------- Physical Exam Details Patient Name: [name] [name] Date of Service: 02/17/2020 1:30 PM Medical Record Number: [id number] Patient Account Number: [id number] Date of Birth/Sex: 12-Dec-1929 (87 y.o. F) Treating RN: Cornell Barman Primary Care Provider: [name] Other Clinician: Referring Provider: [name] Treating Provider/Extender: Tito Dine in Treatment: 2 Constitutional Patient is hypertensive.. Pulse regular and within target range for patient.Marland Kitchen Respirations regular, non-labored and within target range.. Temperature is normal and within the target range for the patient.Marland Kitchen appears in no distress. Cardiovascular Pedal pulses are palpable. Notes Wound examination; the patient does not have any open wounds per se.  The wound initially was on the left lower leg all of this is healed she has localized  swelling in the left anterior lower leg that I view has preulcerative therefore I am concerned about sending her out without wraps on the bilateral legs or at least aggressive compression stockings Electronic Signature(s) Signed: 02/18/2020 1:16:15 PM By: Linton Ham MD Entered By: Linton Ham on 02/17/2020 15:18:21 [name] [name] ([id number]) -------------------------------------------------------------------------------- Physician Orders Details Patient Name: [name] [name] Date of Service: 02/17/2020 1:30 PM Medical Record Number: [id number] Patient Account Number: [id number] Date of Birth/Sex: 04-Jul-1929 (87 y.o. F) Treating RN: Carlene Coria Primary Care Provider: [name] Other Clinician: Referring Provider: [name] Treating Provider/Extender: Tito Dine in Treatment: 2 Verbal / Phone Orders: No Diagnosis Coding Follow-up Appointments o Return Appointment in 1 week. o Nurse Visit as needed Bathing/ Shower/ Hygiene o May shower with wound dressing protected with water repellent cover or cast protector. Edema Control - Lymphedema / Segmental Compressive Device / Other Bilateral Lower Extremities o Optional: One layer of unna paste to top of compression wrap (to act as an anchor). o Elevate legs to the level of the heart and pump ankles as often as possible o Elevate leg(s) parallel to the floor when sitting. o 3 Layer Compression System (Left, Right, Bilateral) for Lymphedema. Additional Orders / Instructions o Follow Nutritious Diet and Increase Protein Intake Electronic Signature(s) Signed: 02/18/2020 1:16:15 PM By: Linton Ham MD Signed: 02/19/2020 10:25:07 AM By: Carlene Coria RN Entered By: Carlene Coria on 02/17/2020 14:46:57 [name] [name]  ([id number]) -------------------------------------------------------------------------------- Problem List Details Patient Name: [name] [name] Date of Service: 02/17/2020 1:30 PM Medical Record Number: [id number] Patient Account Number: [id number] Date of Birth/Sex: [birth year] (87 y.o. F) Treating RN: Cornell Barman Primary Care Provider: [name] Other Clinician: Referring Provider: [name] Treating Provider/Extender: Tito Dine in Treatment: 2 Active Problems ICD-10 Encounter Code Description Active Date MDM Diagnosis I87.312 Chronic venous hypertension (idiopathic) with ulcer of left lower 02/03/2020 No Yes extremity L97.821 Non-pressure chronic ulcer of other part of left lower leg limited to 02/03/2020 No Yes breakdown of skin I89.0 Lymphedema, not elsewhere classified 02/03/2020 No Yes Inactive Problems Resolved Problems Electronic Signature(s) Signed: 02/18/2020 1:16:15 PM By: Linton Ham MD Entered By: Linton Ham on 02/17/2020 15:15:30 [name] [name] ([id number]) -------------------------------------------------------------------------------- Progress Note Details Patient Name: [name] [name] Date of Service: 02/17/2020 1:30 PM Medical Record Number: [id number] Patient Account Number: [id number] Date of Birth/Sex: 02-14-1929 (87 y.o. F) Treating RN: Cornell Barman Primary Care Provider: [name] Other Clinician: Referring Provider: [name] Treating Provider/Extender: Tito Dine in Treatment: 2 Subjective History of Present Illness (HPI) ADMISSION 02/03/2020; This is an 87 year old woman who I think has had chronic edema in her lower legs for quite some time. She has compression stockings but has difficulty getting them on. Therefore she has not been wearing them. She developed a wound on the left anterior lower leg about a month ago. She apparently was seen by dermatology and compression  stockings were provided but she really cannot get them on. She was hospitalized from 12/31/2019 through 01/03/2020 with rapid atrial fibrillation and left leg cellulitis. There was a previous culture of this area on 12/2 that showed MRSA. She was treated with vancomycin and Rocephin in the hospital then Augmentin and doxycycline after she was discharged. She was readmitted to hospital from 12/13 through 12/18 at which time she had rapid atrial fibrillation. During the second hospitalization she was noted to have chronic venous stasis with ulcers bilaterally worse on the left. A lower extremity Doppler was  negative for DVT, left tib-fib x-ray showed no evidence of osteomyelitis. She was again placed on antibiotics Keflex and doxycycline and a follow-up was made here. Past medical history includes paroxysmal atrial fibrillation, recurrent pelvic fractures times assuming secondary to osteoporosis, hypertension, hyperlipidemia, history of skin cancers including several extractions in her legs. She is followed by dermatology ABI in our clinic was 0.82 on the left 02/11/2020 on evaluation today patient actually appears to be making good progress at this time in regard to her wounds. She has been tolerating the dressing changes without complication. Fortunately there is no evidence of active infection which is great news. No fevers, chills, nausea, vomiting, or diarrhea. She did get one of the juxta lites but has not received the other as of yet. They tell me that is the patient and her son that did pay for the second as well. 1/19; this is a patient with chronic venous insufficiency wounds on her left leg. This is closed over. The edema in her leg is reasonably well controlled except for the anterior lower legs bilaterally. Here there is localized subcutaneous swelling which almost has the consistency of a large blister. I do not recall it looking like this 2 weeks ago. She has 1 Farrow wrap 2000 and another 1  is apparently on the way. She has no open wounds today Objective Constitutional Patient is hypertensive.. Pulse regular and within target range for patient.Marland Kitchen Respirations regular, non-labored and within target range.. Temperature is normal and within the target range for the patient.Marland Kitchen appears in no distress. Vitals Time Taken: 1:55 PM, Height: 63 in, Weight: 125 lbs, BMI: 22.1, Temperature: 98.3 F, Pulse: 67 bpm, Respiratory Rate: 18 breaths/min, Blood Pressure: 151/70 mmHg. Cardiovascular Pedal pulses are palpable. General Notes: Wound examination; the patient does not have any open wounds per se. The wound initially was on the left lower leg all of this is healed she has localized swelling in the left anterior lower leg that I view has preulcerative therefore I am concerned about sending her out without wraps on the bilateral legs or at least aggressive compression stockings Assessment Active Problems ICD-10 Chronic venous hypertension (idiopathic) with ulcer of left lower extremity [name] [name], [name] [name] ([id number]) Non-pressure chronic ulcer of other part of left lower leg limited to breakdown of skin Lymphedema, not elsewhere classified Procedures There was a Three Layer Compression Therapy Procedure with a pre-treatment ABI of 0.8 by Carlene Coria, RN. Post procedure Diagnosis Wound #: Same as Pre-Procedure Plan Follow-up Appointments: Return Appointment in 1 week. Nurse Visit as needed Bathing/ Shower/ Hygiene: May shower with wound dressing protected with water repellent cover or cast protector. Edema Control - Lymphedema / Segmental Compressive Device / Other: Optional: One layer of unna paste to top of compression wrap (to act as an anchor). Elevate legs to the level of the heart and pump ankles as often as possible Elevate leg(s) parallel to the floor when sitting. 3 Layer Compression System (Left, Right, Bilateral) for Lymphedema. Additional Orders /  Instructions: Follow Nutritious Diet and Increase Protein Intake #1 I think the localized swelling might have been due to the Tubigrip stockings that were put on last week. 2. This was on both legs and in view of this I was concerned about not rewrapping her. This time with ABD pads over the localized swelling in the anterior lower legs 3. She is to bring her second Farrow wrap stockings next week and we should be able to discharge her at that point 4. She  has her son living with her from [place]. He will be able to help her replace the stockings however she is going to have to get these in a very snug position ongoing to maintain skin integrity Electronic Signature(s) Signed: 02/18/2020 1:16:15 PM By: Linton Ham MD Entered By: Linton Ham on 02/17/2020 15:19:38 [name] [name] ([id number]) -------------------------------------------------------------------------------- SuperBill Details Patient Name: [name] [name] Date of Service: 02/17/2020 Medical Record Number: [id number] Patient Account Number: [id number] Date of Birth/Sex: 1929-04-18 (87 y.o. F) Treating RN: Carlene Coria Primary Care Provider: [name] Other Clinician: Referring Provider: [name] Treating Provider/Extender: Tito Dine in Treatment: 2 Diagnosis Coding ICD-10 Codes Code Description [phone] Chronic venous hypertension (idiopathic) with ulcer of left lower extremity L97.821 Non-pressure chronic ulcer of other part of left lower leg limited to breakdown of skin I89.0 Lymphedema, not elsewhere classified Facility Procedures CPT4: Description Modifier Quantity Code [id number] BILATERAL: Application of multi-layer venous compression system; leg (below knee), including 1 ankle and foot. Physician Procedures CPT4 Code: [id number] Description: 47425 - WC PHYS LEVEL 3 - EST PT Modifier: Quantity: 1 CPT4 Code: Description: ICD-10 Diagnosis Description I87.312  Chronic venous hypertension (idiopathic) with ulcer of left lower extremi L97.821 Non-pressure chronic ulcer of other part of left lower leg limited to bre I89.0 Lymphedema, not elsewhere classified Modifier: ty Lenise Herald of skin Quantity: Electronic Signature(s) Signed: 02/18/2020 1:16:15 PM By: Linton Ham MD Entered By: Linton Ham on 02/17/2020 15:19:57

## 2020-02-24 ENCOUNTER — Encounter: Payer: Medicare Other | Admitting: Internal Medicine

## 2020-02-24 ENCOUNTER — Other Ambulatory Visit: Payer: Self-pay

## 2020-02-24 DIAGNOSIS — L97821 Non-pressure chronic ulcer of other part of left lower leg limited to breakdown of skin: Secondary | ICD-10-CM | POA: Diagnosis not present

## 2020-02-25 NOTE — Progress Notes (Signed)
AALIA, FOLKES ([id number]) Visit Report for 02/24/2020 Arrival Information Details Patient Name: [name] [name], [name] [name]. Date of Service: 02/24/2020 1:45 PM Medical Record Number: [id number] Patient Account Number: [id number] Date of Birth/Sex: May 30, 1929 (87 y.o. F) Treating RN: Carlene Coria Primary Care Astin Rape: [name] Other Clinician: Referring Jun Rightmyer: [name] Treating Guila Owensby/Extender: Tito Dine in Treatment: 3 Visit Information History Since Last Visit All ordered tests and consults were completed: No Patient Arrived: Gilford Rile Added or deleted any medications: No Arrival Time: 14:12 Any new allergies or adverse reactions: No Accompanied By: son Had a fall or experienced change in No Transfer Assistance: None activities of daily living that may affect Patient Identification Verified: Yes risk of falls: Secondary Verification Process Completed: Yes Signs or symptoms of abuse/neglect since last visito No Patient Requires Transmission-Based No Hospitalized since last visit: No Precautions: Implantable device outside of the clinic excluding No Patient Has Alerts: Yes cellular tissue based products placed in the center Patient Alerts: Patient on Blood since last visit: Thinner Has Dressing in Place as Prescribed: Yes Has Compression in Place as Prescribed: Yes Pain Present Now: No Electronic Signature(s) Signed: 02/25/2020 11:33:31 AM By: Carlene Coria RN Entered By: Carlene Coria on 02/24/2020 14:12:52 [name] [name] ([id number]) -------------------------------------------------------------------------------- Clinic Level of Care Assessment Details Patient Name: [name] [name] Date of Service: 02/24/2020 1:45 PM Medical Record Number: [id number] Patient Account Number: [id number] Date of Birth/Sex: [birth year] (87 y.o. F) Treating RN: Cornell Barman Primary Care Aribelle Mccosh: [name] Other Clinician: Referring  Hanna Ra: [name] Treating Ryleigh Buenger/Extender: Tito Dine in Treatment: 3 Clinic Level of Care Assessment Items TOOL 4 Quantity Score []  - Use when only an EandM is performed on FOLLOW-UP visit 0 ASSESSMENTS - Nursing Assessment / Reassessment X - Reassessment of Co-morbidities (includes updates in patient status) 1 10 X- 1 5 Reassessment of Adherence to Treatment Plan ASSESSMENTS - Wound and Skin Assessment / Reassessment X - Simple Wound Assessment / Reassessment - one wound 1 5 []  - 0 Complex Wound Assessment / Reassessment - multiple wounds []  - 0 Dermatologic / Skin Assessment (not related to wound area) ASSESSMENTS - Focused Assessment []  - Circumferential Edema Measurements - multi extremities 0 []  - 0 Nutritional Assessment / Counseling / Intervention []  - 0 Lower Extremity Assessment (monofilament, tuning fork, pulses) []  - 0 Peripheral Arterial Disease Assessment (using hand held doppler) ASSESSMENTS - Ostomy and/or Continence Assessment and Care []  - Incontinence Assessment and Management 0 []  - 0 Ostomy Care Assessment and Management (repouching, etc.) PROCESS - Coordination of Care X - Simple Patient / Family Education for ongoing care 1 15 []  - 0 Complex (extensive) Patient / Family Education for ongoing care []  - 0 Staff obtains Programmer, systems, Records, Test Results / Process Orders []  - 0 Staff telephones HHA, Nursing Homes / Clarify orders / etc []  - 0 Routine Transfer to another Facility (non-emergent condition) []  - 0 Routine Hospital Admission (non-emergent condition) []  - 0 New Admissions / Biomedical engineer / Ordering NPWT, Apligraf, etc. []  - 0 Emergency Hospital Admission (emergent condition) X- 1 10 Simple Discharge Coordination []  - 0 Complex (extensive) Discharge Coordination PROCESS - Special Needs []  - Pediatric / Minor Patient Management 0 []  - 0 Isolation Patient Management []  - 0 Hearing / Language / Visual  special needs []  - 0 Assessment of Community assistance (transportation, D/C planning, etc.) []  - 0 Additional assistance / Altered mentation []  - 0 Support Surface(s) Assessment (bed, cushion, seat, etc.) INTERVENTIONS - Wound  Cleansing / Measurement CHANDY, TARMAN ([id number]) []  - 0 Simple Wound Cleansing - one wound []  - 0 Complex Wound Cleansing - multiple wounds X- 1 5 Wound Imaging (photographs - any number of wounds) []  - 0 Wound Tracing (instead of photographs) []  - 0 Simple Wound Measurement - one wound []  - 0 Complex Wound Measurement - multiple wounds INTERVENTIONS - Wound Dressings []  - Small Wound Dressing one or multiple wounds 0 []  - 0 Medium Wound Dressing one or multiple wounds []  - 0 Large Wound Dressing one or multiple wounds []  - 0 Application of Medications - topical []  - 0 Application of Medications - injection INTERVENTIONS - Miscellaneous []  - External ear exam 0 []  - 0 Specimen Collection (cultures, biopsies, blood, body fluids, etc.) []  - 0 Specimen(s) / Culture(s) sent or taken to Lab for analysis []  - 0 Patient Transfer (multiple staff / Civil Service fast streamer / Similar devices) []  - 0 Simple Staple / Suture removal (25 or less) []  - 0 Complex Staple / Suture removal (26 or more) []  - 0 Hypo / Hyperglycemic Management (close monitor of Blood Glucose) []  - 0 Ankle / Brachial Index (ABI) - do not check if billed separately X- 1 5 Vital Signs Has the patient been seen at the hospital within the last three years: Yes Total Score: 55 Level Of Care: New/Established - Level 2 Electronic Signature(s) Signed: 02/24/2020 5:40:58 PM By: Gretta Cool, BSN, RN, CWS, Kim RN, BSN Entered By: Gretta Cool, BSN, RN, CWS, Kim on 02/24/2020 14:31:42 [name] [name] ([id number]) -------------------------------------------------------------------------------- Encounter Discharge Information Details Patient Name: [name] [name] Date of Service: 02/24/2020 1:45  PM Medical Record Number: [id number] Patient Account Number: [id number] Date of Birth/Sex: February 03, 1929 (87 y.o. F) Treating RN: Cornell Barman Primary Care Adrion Menz: [name] Other Clinician: Referring Jaydin Boniface: [name] Treating Trystyn Dolley/Extender: Tito Dine in Treatment: 3 Encounter Discharge Information Items Discharge Condition: Stable Ambulatory Status: Walker Discharge Destination: Home Transportation: Private Auto Accompanied By: self Schedule Follow-up Appointment: Yes Clinical Summary of Care: Electronic Signature(s) Signed: 02/24/2020 5:17:40 PM By: Gretta Cool, BSN, RN, CWS, Kim RN, BSN Entered By: Gretta Cool, BSN, RN, CWS, Kim on 02/24/2020 17:17:39 [name] [name] ([id number]) -------------------------------------------------------------------------------- Lower Extremity Assessment Details Patient Name: [name] [name] Date of Service: 02/24/2020 1:45 PM Medical Record Number: [id number] Patient Account Number: [id number] Date of Birth/Sex: February 02, 1929 (87 y.o. F) Treating RN: Carlene Coria Primary Care Jaquay Posthumus: [name] Other Clinician: Referring Tailynn Armetta: [name] Treating Zena Vitelli/Extender: Tito Dine in Treatment: 3 Edema Assessment Assessed: [Left: No] [Right: No] [Left: Edema] [Right: :] Calf Left: Right: Point of Measurement: 39 cm From Medial Instep 31 cm 32 cm Ankle Left: Right: Point of Measurement: 11 cm From Medial Instep 20 cm 20 cm Vascular Assessment Pulses: Dorsalis Pedis Palpable: [Left:Yes] [Right:Yes] Electronic Signature(s) Signed: 02/25/2020 11:33:31 AM By: Carlene Coria RN Entered By: Carlene Coria on 02/24/2020 14:18:37 [name] [name] ([id number]) -------------------------------------------------------------------------------- Multi Wound Chart Details Patient Name: [name] [name] Date of Service: 02/24/2020 1:45 PM Medical Record Number: [id number] Patient Account  Number: [id number] Date of Birth/Sex: 11-29-1929 (87 y.o. F) Treating RN: Cornell Barman Primary Care Honor Frison: [name] Other Clinician: Referring Shemaiah Round: [name] Treating Gerry Blanchfield/Extender: Tito Dine in Treatment: 3 Vital Signs Height(in): 63 Pulse(bpm): 63 Weight(lbs): 125 Blood Pressure(mmHg): 130/66 Body Mass Index(BMI): 22 Temperature(F): 97.9 Respiratory Rate(breaths/min): 20 Wound Assessments Treatment Notes Electronic Signature(s) Signed: 02/24/2020 5:40:58 PM By: Gretta Cool, BSN, RN, CWS, Kim RN, BSN Entered By: Gretta Cool, BSN, RN, CWS,  Kim on 02/24/2020 14:29:30 [name] [name] ([id number]) -------------------------------------------------------------------------------- [place]le Details Patient Name: RUBI, TOOLEY. Date of Service: 02/24/2020 1:45 PM Medical Record Number: [id number] Patient Account Number: [id number] Date of Birth/Sex: 08/01/1929 (87 y.o. F) Treating RN: Cornell Barman Primary Care Hillard Goodwine: [name] Other Clinician: Referring Tanga Gloor: [name] Treating Johnel Yielding/Extender: Tito Dine in Treatment: 3 Active Inactive Electronic Signature(s) Signed: 02/24/2020 5:40:58 PM By: Gretta Cool, BSN, RN, CWS, Kim RN, BSN Entered By: Gretta Cool, BSN, RN, CWS, Kim on 02/24/2020 14:29:21 [name] [name] ([id number]) -------------------------------------------------------------------------------- Pain Assessment Details Patient Name: [name] [name] Date of Service: 02/24/2020 1:45 PM Medical Record Number: [id number] Patient Account Number: [id number] Date of Birth/Sex: 02-17-1929 (87 y.o. F) Treating RN: Carlene Coria Primary Care Rayette Mogg: [name] Other Clinician: Referring Ermon Sagan: [name] Treating Kerby Borner/Extender: Tito Dine in Treatment: 3 Active Problems Location of Pain Severity and Description of Pain Patient Has Paino No Site  Locations Pain Management and Medication Current Pain Management: Electronic Signature(s) Signed: 02/25/2020 11:33:31 AM By: Carlene Coria RN Entered By: Carlene Coria on 02/24/2020 14:13:15 [name] [name] ([id number]) -------------------------------------------------------------------------------- Vitals Details Patient Name: [name] [name] Date of Service: 02/24/2020 1:45 PM Medical Record Number: [id number] Patient Account Number: [id number] Date of Birth/Sex: 04/08/1929 (87 y.o. F) Treating RN: Carlene Coria Primary Care Palyn Scrima: [name] Other Clinician: Referring Margarette Vannatter: [name] Treating Levia Waltermire/Extender: Tito Dine in Treatment: 3 Vital Signs Time Taken: 14:12 Temperature (F): 97.9 Height (in): 63 Pulse (bpm): 62 Weight (lbs): 125 Respiratory Rate (breaths/min): 20 Body Mass Index (BMI): 22.1 Blood Pressure (mmHg): 130/66 Reference Range: 80 - 120 mg / dl Electronic Signature(s) Signed: 02/25/2020 11:33:31 AM By: Carlene Coria RN Entered By: Carlene Coria on 02/24/2020 14:13:09

## 2020-02-25 NOTE — Progress Notes (Signed)
[name], [name] ([id number]) Visit Report for 02/24/2020 HPI Details Patient Name: [name] [name], [name] [name]. Date of Service: 02/24/2020 1:45 PM Medical Record Number: [id number] Patient Account Number: [id number] Date of Birth/Sex: [birth year] (87 y.o. F) Treating RN: Cornell Barman Primary Care Provider: [name] Other Clinician: Referring Provider: [name] Treating Provider/Extender: Tito Dine in Treatment: 3 History of Present Illness HPI Description: ADMISSION 02/03/2020; This is an 87 year old woman who I think has had chronic edema in her lower legs for quite some time. She has compression stockings but has difficulty getting them on. Therefore she has not been wearing them. She developed a wound on the left anterior lower leg about a month ago. She apparently was seen by dermatology and compression stockings were provided but she really cannot get them on. She was hospitalized from 12/31/2019 through 01/03/2020 with rapid atrial fibrillation and left leg cellulitis. There was a previous culture of this area on 12/2 that showed MRSA. She was treated with vancomycin and Rocephin in the hospital then Augmentin and doxycycline after she was discharged. She was readmitted to hospital from 12/13 through 12/18 at which time she had rapid atrial fibrillation. During the second hospitalization she was noted to have chronic venous stasis with ulcers bilaterally worse on the left. A lower extremity Doppler was negative for DVT, left tib-fib x-ray showed no evidence of osteomyelitis. She was again placed on antibiotics Keflex and doxycycline and a follow-up was made here. Past medical history includes paroxysmal atrial fibrillation, recurrent pelvic fractures times assuming secondary to osteoporosis, hypertension, hyperlipidemia, history of skin cancers including several extractions in her legs. She is followed by dermatology ABI in our clinic was 0.82 on the  left 02/11/2020 on evaluation today patient actually appears to be making good progress at this time in regard to her wounds. She has been tolerating the dressing changes without complication. Fortunately there is no evidence of active infection which is great news. No fevers, chills, nausea, vomiting, or diarrhea. She did get one of the juxta lites but has not received the other as of yet. They tell me that is the patient and her son that did pay for the second as well. 1/19; this is a patient with chronic venous insufficiency wounds on her left leg. This is closed over. The edema in her leg is reasonably well controlled except for the anterior lower legs bilaterally. Here there is localized subcutaneous swelling which almost has the consistency of a large blister. I do not recall it looking like this 2 weeks ago. She has 1 Farrow wrap 2000 and another 1 is apparently on the way. She has no open wounds today 1/26; severe chronic venous insufficiency. Her wounds are closed over. He has very fragile skin in the anterior lower legs. Took Tubigrip stop this last week which I think caused excessive anterior swelling and put 3 layer compression on her. This looks better but still very fragile. There is no doubt she is going to require compression stockings going forward. She has bilateral Financial controller) Signed: 02/25/2020 11:56:06 AM By: Linton Ham MD Entered By: Linton Ham on 02/24/2020 14:57:04 [name] [name] ([id number]) -------------------------------------------------------------------------------- Physical Exam Details Patient Name: [name] [name] Date of Service: 02/24/2020 1:45 PM Medical Record Number: [id number] Patient Account Number: [id number] Date of Birth/Sex: 05-28-1929 (87 y.o. F) Treating RN: Cornell Barman Primary Care Provider: [name] Other Clinician: Referring Provider: [name] Treating Provider/Extender: Ricard Dillon Weeks in Treatment: 3 Constitutional Sitting or standing  Blood Pressure is within target range for patient.. Pulse regular and within target range for patient.Marland Kitchen Respirations regular, non- labored and within target range.. Temperature is normal and within the target range for the patient.Marland Kitchen appears in no distress. Notes Wound examination there is no open wound but extremely fragile skin in the anterior lower extremities bilaterally. Electronic Signature(s) Signed: 02/25/2020 11:56:06 AM By: Linton Ham MD Entered By: Linton Ham on 02/24/2020 14:58:10 [name] [name] ([id number]) -------------------------------------------------------------------------------- Physician Orders Details Patient Name: [name] [name] Date of Service: 02/24/2020 1:45 PM Medical Record Number: [id number] Patient Account Number: [id number] Date of Birth/Sex: 06-29-1929 (87 y.o. F) Treating RN: Cornell Barman Primary Care Provider: [name] Other Clinician: Referring Provider: [name] Treating Provider/Extender: Tito Dine in Treatment: 3 Verbal / Phone Orders: No Diagnosis Coding Discharge From [facility] Services o Discharge from [place] Treatment Complete o Wear compression garments daily. Put garments on first thing when you wake up and remove them before bed. o Moisturize legs daily after removing compression garments. o Elevate, Exercise Daily and Avoid Standing for Long Periods of Time. Follow-up Appointments o Other: - As needed. Electronic Signature(s) Signed: 02/24/2020 5:40:58 PM By: Gretta Cool, BSN, RN, CWS, Kim RN, BSN Signed: 02/25/2020 11:56:06 AM By: Linton Ham MD Entered By: Gretta Cool, BSN, RN, CWS, Kim on 02/24/2020 14:30:46 ZEBA, CERIO ([id number]) -------------------------------------------------------------------------------- Problem List Details Patient Name: [name], [name] Date of Service: 02/24/2020 1:45  PM Medical Record Number: [id number] Patient Account Number: [id number] Date of Birth/Sex: 05/09/1929 (87 y.o. F) Treating RN: Cornell Barman Primary Care Provider: [name] Other Clinician: Referring Provider: [name] Treating Provider/Extender: Tito Dine in Treatment: 3 Active Problems ICD-10 Encounter Code Description Active Date MDM Diagnosis I87.312 Chronic venous hypertension (idiopathic) with ulcer of left lower 02/03/2020 No Yes extremity L97.821 Non-pressure chronic ulcer of other part of left lower leg limited to 02/03/2020 No Yes breakdown of skin I89.0 Lymphedema, not elsewhere classified 02/03/2020 No Yes Inactive Problems Resolved Problems Electronic Signature(s) Signed: 02/25/2020 11:56:06 AM By: Linton Ham MD Entered By: Linton Ham on 02/24/2020 14:55:44 [name] [name] ([id number]) -------------------------------------------------------------------------------- Progress Note Details Patient Name: [name] [name] Date of Service: 02/24/2020 1:45 PM Medical Record Number: [id number] Patient Account Number: [id number] Date of Birth/Sex: [birth year] (87 y.o. F) Treating RN: Cornell Barman Primary Care Provider: [name] Other Clinician: Referring Provider: [name] Treating Provider/Extender: Tito Dine in Treatment: 3 Subjective History of Present Illness (HPI) ADMISSION 02/03/2020; This is an 87 year old woman who I think has had chronic edema in her lower legs for quite some time. She has compression stockings but has difficulty getting them on. Therefore she has not been wearing them. She developed a wound on the left anterior lower leg about a month ago. She apparently was seen by dermatology and compression stockings were provided but she really cannot get them on. She was hospitalized from 12/31/2019 through 01/03/2020 with rapid atrial fibrillation and left leg cellulitis. There was a  previous culture of this area on 12/2 that showed MRSA. She was treated with vancomycin and Rocephin in the hospital then Augmentin and doxycycline after she was discharged. She was readmitted to hospital from 12/13 through 12/18 at which time she had rapid atrial fibrillation. During the second hospitalization she was noted to have chronic venous stasis with ulcers bilaterally worse on the left. A lower extremity Doppler was negative for DVT, left tib-fib x-ray showed no evidence of osteomyelitis. She was again placed on antibiotics  Keflex and doxycycline and a follow-up was made here. Past medical history includes paroxysmal atrial fibrillation, recurrent pelvic fractures times assuming secondary to osteoporosis, hypertension, hyperlipidemia, history of skin cancers including several extractions in her legs. She is followed by dermatology ABI in our clinic was 0.82 on the left 02/11/2020 on evaluation today patient actually appears to be making good progress at this time in regard to her wounds. She has been tolerating the dressing changes without complication. Fortunately there is no evidence of active infection which is great news. No fevers, chills, nausea, vomiting, or diarrhea. She did get one of the juxta lites but has not received the other as of yet. They tell me that is the patient and her son that did pay for the second as well. 1/19; this is a patient with chronic venous insufficiency wounds on her left leg. This is closed over. The edema in her leg is reasonably well controlled except for the anterior lower legs bilaterally. Here there is localized subcutaneous swelling which almost has the consistency of a large blister. I do not recall it looking like this 2 weeks ago. She has 1 Farrow wrap 2000 and another 1 is apparently on the way. She has no open wounds today 1/26; severe chronic venous insufficiency. Her wounds are closed over. He has very fragile skin in the anterior lower legs.  Took Tubigrip stop this last week which I think caused excessive anterior swelling and put 3 layer compression on her. This looks better but still very fragile. There is no doubt she is going to require compression stockings going forward. She has bilateral Farrow wraps Objective Constitutional Sitting or standing Blood Pressure is within target range for patient.. Pulse regular and within target range for patient.Marland Kitchen Respirations regular, non- labored and within target range.. Temperature is normal and within the target range for the patient.Marland Kitchen appears in no distress. Vitals Time Taken: 2:12 PM, Height: 63 in, Weight: 125 lbs, BMI: 22.1, Temperature: 97.9 F, Pulse: 62 bpm, Respiratory Rate: 20 breaths/min, Blood Pressure: 130/66 mmHg. General Notes: Wound examination there is no open wound but extremely fragile skin in the anterior lower extremities bilaterally. Assessment Active Problems ICD-10 Chronic venous hypertension (idiopathic) with ulcer of left lower extremity Non-pressure chronic ulcer of other part of left lower leg limited to breakdown of skin Lymphedema, not elsewhere classified MAREA, DETERT ([id number]) Plan Discharge From [facility] Services: Discharge from [place] Treatment Complete Wear compression garments daily. Put garments on first thing when you wake up and remove them before bed. Moisturize legs daily after removing compression garments. Elevate, Exercise Daily and Avoid Standing for Long Periods of Time. Follow-up Appointments: Other: - As needed. 1. Unfortunately the Farrow wrap she had did not really fit her [too long] 2. We called the supply company and they are going to shift a smaller version tomorrow. We showed her son how to put this on. I am concerned that the patient would not be able to do this herself. 3. Her son lives actually in [place] but has been here for a long period of time. I am not certain the patient would be able to handle this  if he was not there to help her. In that circumstance she is at extreme risk of skin breakdown in these areas from minimal trauma or no trauma at all. 4 the patient can be discharged from the clinic however I think her chances of recidivism are certainly very high Electronic Signature(s) Signed: 02/25/2020 11:56:06 AM By:  Linton Ham MD Entered By: Linton Ham on 02/24/2020 15:00:06 [name] [name] ([id number]) -------------------------------------------------------------------------------- SuperBill Details Patient Name: [name] [name] Date of Service: 02/24/2020 Medical Record Number: [id number] Patient Account Number: [id number] Date of Birth/Sex: 02-11-1929 (87 y.o. F) Treating RN: Cornell Barman Primary Care Provider: [name] Other Clinician: Referring Provider: [name] Treating Provider/Extender: Tito Dine in Treatment: 3 Diagnosis Coding ICD-10 Codes Code Description [phone] Chronic venous hypertension (idiopathic) with ulcer of left lower extremity L97.821 Non-pressure chronic ulcer of other part of left lower leg limited to breakdown of skin I89.0 Lymphedema, not elsewhere classified Facility Procedures CPT4 Code: [id number] Description: 99213 - WOUND CARE VISIT-LEV 3 EST PT Modifier: Quantity: 1 Physician Procedures CPT4 Code: 3614431 Description: 54008 - WC PHYS LEVEL 3 - EST PT Modifier: Quantity: 1 CPT4 Code: Description: ICD-10 Diagnosis Description I87.312 Chronic venous hypertension (idiopathic) with ulcer of left lower extremi L97.821 Non-pressure chronic ulcer of other part of left lower leg limited to bre I89.0 Lymphedema, not elsewhere classified Modifier: ty Lenise Herald of skin Quantity: Electronic Signature(s) Signed: 02/25/2020 11:56:06 AM By: Linton Ham MD Entered By: Linton Ham on 02/24/2020 15:01:19

## 2020-03-25 ENCOUNTER — Emergency Department: Payer: Medicare Other

## 2020-03-25 ENCOUNTER — Inpatient Hospital Stay
Admission: EM | Admit: 2020-03-25 | Discharge: 2020-04-02 | DRG: 811 | Disposition: A | Discharge status: Home Health | Payer: Medicare Other | Attending: Hospitalist | Admitting: Hospitalist

## 2020-03-25 ENCOUNTER — Other Ambulatory Visit: Payer: Self-pay

## 2020-03-25 DIAGNOSIS — J90 Pleural effusion, not elsewhere classified: Secondary | ICD-10-CM | POA: Diagnosis present

## 2020-03-25 DIAGNOSIS — R911 Solitary pulmonary nodule: Secondary | ICD-10-CM | POA: Diagnosis present

## 2020-03-25 DIAGNOSIS — Z9841 Cataract extraction status, right eye: Secondary | ICD-10-CM

## 2020-03-25 DIAGNOSIS — I10 Essential (primary) hypertension: Secondary | ICD-10-CM | POA: Diagnosis present

## 2020-03-25 DIAGNOSIS — M8580 Other specified disorders of bone density and structure, unspecified site: Secondary | ICD-10-CM | POA: Diagnosis present

## 2020-03-25 DIAGNOSIS — D500 Iron deficiency anemia secondary to blood loss (chronic): Secondary | ICD-10-CM | POA: Diagnosis not present

## 2020-03-25 DIAGNOSIS — E785 Hyperlipidemia, unspecified: Secondary | ICD-10-CM | POA: Diagnosis not present

## 2020-03-25 DIAGNOSIS — Z881 Allergy status to other antibiotic agents status: Secondary | ICD-10-CM

## 2020-03-25 DIAGNOSIS — E876 Hypokalemia: Secondary | ICD-10-CM | POA: Diagnosis present

## 2020-03-25 DIAGNOSIS — Z7901 Long term (current) use of anticoagulants: Secondary | ICD-10-CM

## 2020-03-25 DIAGNOSIS — D649 Anemia, unspecified: Secondary | ICD-10-CM | POA: Diagnosis present

## 2020-03-25 DIAGNOSIS — H9192 Unspecified hearing loss, left ear: Secondary | ICD-10-CM | POA: Diagnosis present

## 2020-03-25 DIAGNOSIS — K922 Gastrointestinal hemorrhage, unspecified: Secondary | ICD-10-CM | POA: Diagnosis present

## 2020-03-25 DIAGNOSIS — I4821 Permanent atrial fibrillation: Secondary | ICD-10-CM | POA: Diagnosis present

## 2020-03-25 DIAGNOSIS — R0600 Dyspnea, unspecified: Secondary | ICD-10-CM

## 2020-03-25 DIAGNOSIS — E7800 Pure hypercholesterolemia, unspecified: Secondary | ICD-10-CM | POA: Diagnosis present

## 2020-03-25 DIAGNOSIS — Z87891 Personal history of nicotine dependence: Secondary | ICD-10-CM

## 2020-03-25 DIAGNOSIS — J9601 Acute respiratory failure with hypoxia: Secondary | ICD-10-CM | POA: Diagnosis present

## 2020-03-25 DIAGNOSIS — F419 Anxiety disorder, unspecified: Secondary | ICD-10-CM | POA: Diagnosis present

## 2020-03-25 DIAGNOSIS — Z974 Presence of external hearing-aid: Secondary | ICD-10-CM

## 2020-03-25 DIAGNOSIS — D509 Iron deficiency anemia, unspecified: Secondary | ICD-10-CM | POA: Diagnosis present

## 2020-03-25 DIAGNOSIS — Z20822 Contact with and (suspected) exposure to covid-19: Secondary | ICD-10-CM | POA: Diagnosis present

## 2020-03-25 DIAGNOSIS — J918 Pleural effusion in other conditions classified elsewhere: Secondary | ICD-10-CM | POA: Diagnosis present

## 2020-03-25 DIAGNOSIS — Z887 Allergy status to serum and vaccine status: Secondary | ICD-10-CM

## 2020-03-25 DIAGNOSIS — I110 Hypertensive heart disease with heart failure: Secondary | ICD-10-CM | POA: Diagnosis present

## 2020-03-25 DIAGNOSIS — K5909 Other constipation: Secondary | ICD-10-CM | POA: Diagnosis present

## 2020-03-25 DIAGNOSIS — Z9889 Other specified postprocedural states: Secondary | ICD-10-CM

## 2020-03-25 DIAGNOSIS — Z882 Allergy status to sulfonamides status: Secondary | ICD-10-CM

## 2020-03-25 DIAGNOSIS — Z9842 Cataract extraction status, left eye: Secondary | ICD-10-CM

## 2020-03-25 DIAGNOSIS — I5033 Acute on chronic diastolic (congestive) heart failure: Secondary | ICD-10-CM | POA: Diagnosis present

## 2020-03-25 DIAGNOSIS — Z79899 Other long term (current) drug therapy: Secondary | ICD-10-CM

## 2020-03-25 DIAGNOSIS — Z961 Presence of intraocular lens: Secondary | ICD-10-CM | POA: Diagnosis present

## 2020-03-25 DIAGNOSIS — Z85828 Personal history of other malignant neoplasm of skin: Secondary | ICD-10-CM

## 2020-03-25 DIAGNOSIS — J811 Chronic pulmonary edema: Secondary | ICD-10-CM

## 2020-03-25 LAB — CBC WITH DIFFERENTIAL/PLATELET
Abs Immature Granulocytes: 0.06 10*3/uL (ref 0.00–0.07)
Basophils Absolute: 0.1 10*3/uL (ref 0.0–0.1)
Basophils Relative: 1 %
Eosinophils Absolute: 0.2 10*3/uL (ref 0.0–0.5)
Eosinophils Relative: 2 %
HCT: 22 % — ABNORMAL LOW (ref 36.0–46.0)
Hemoglobin: 6.5 g/dL — ABNORMAL LOW (ref 12.0–15.0)
Immature Granulocytes: 1 %
Lymphocytes Relative: 16 %
Lymphs Abs: 1.6 10*3/uL (ref 0.7–4.0)
MCH: 22.9 pg — ABNORMAL LOW (ref 26.0–34.0)
MCHC: 29.5 g/dL — ABNORMAL LOW (ref 30.0–36.0)
MCV: 77.5 fL — ABNORMAL LOW (ref 80.0–100.0)
Monocytes Absolute: 1.4 10*3/uL — ABNORMAL HIGH (ref 0.1–1.0)
Monocytes Relative: 14 %
Neutro Abs: 6.8 10*3/uL (ref 1.7–7.7)
Neutrophils Relative %: 66 %
Platelets: 326 10*3/uL (ref 150–400)
RBC: 2.84 MIL/uL — ABNORMAL LOW (ref 3.87–5.11)
RDW: 21.9 % — ABNORMAL HIGH (ref 11.5–15.5)
Smear Review: NORMAL
WBC: 10.2 10*3/uL (ref 4.0–10.5)
nRBC: 0.3 % — ABNORMAL HIGH (ref 0.0–0.2)

## 2020-03-25 LAB — RESP PANEL BY RT-PCR (FLU A&B, COVID) ARPGX2
Influenza A by PCR: NEGATIVE
Influenza B by PCR: NEGATIVE
SARS Coronavirus 2 by RT PCR: NEGATIVE

## 2020-03-25 LAB — RETICULOCYTES
Immature Retic Fract: 10.3 % (ref 2.3–15.9)
RBC.: 2.82 MIL/uL — ABNORMAL LOW (ref 3.87–5.11)
Retic Count, Absolute: 59.2 10*3/uL (ref 19.0–186.0)
Retic Ct Pct: 2.1 % (ref 0.4–3.1)

## 2020-03-25 LAB — PROTIME-INR
INR: 2.2 — ABNORMAL HIGH (ref 0.8–1.2)
Prothrombin Time: 23.4 seconds — ABNORMAL HIGH (ref 11.4–15.2)

## 2020-03-25 LAB — COMPREHENSIVE METABOLIC PANEL
ALT: 16 U/L (ref 0–44)
AST: 24 U/L (ref 15–41)
Albumin: 3.3 g/dL — ABNORMAL LOW (ref 3.5–5.0)
Alkaline Phosphatase: 67 U/L (ref 38–126)
Anion gap: 11 (ref 5–15)
BUN: 14 mg/dL (ref 8–23)
CO2: 29 mmol/L (ref 22–32)
Calcium: 8.7 mg/dL — ABNORMAL LOW (ref 8.9–10.3)
Chloride: 101 mmol/L (ref 98–111)
Creatinine, Ser: 0.87 mg/dL (ref 0.44–1.00)
GFR, Estimated: >60 mL/min (ref >60)
Glucose, Bld: 147 mg/dL — ABNORMAL HIGH (ref 70–99)
Potassium: 2.3 mmol/L — CL (ref 3.5–5.1)
Sodium: 141 mmol/L (ref 135–145)
Total Bilirubin: 0.7 mg/dL (ref 0.3–1.2)
Total Protein: 6.8 g/dL (ref 6.5–8.1)

## 2020-03-25 LAB — URINALYSIS, COMPLETE (UACMP) WITH MICROSCOPIC
Bacteria, UA: NONE SEEN
Bilirubin Urine: NEGATIVE
Glucose, UA: NEGATIVE mg/dL
Hgb urine dipstick: NEGATIVE
Ketones, ur: NEGATIVE mg/dL
Leukocytes,Ua: NEGATIVE
Nitrite: NEGATIVE
Protein, ur: NEGATIVE mg/dL
Specific Gravity, Urine: 1.018 (ref 1.005–1.030)
Squamous Epithelial / HPF: NONE SEEN (ref 0–5)
pH: 9 — ABNORMAL HIGH (ref 5.0–8.0)

## 2020-03-25 LAB — IRON AND TIBC
Iron: 22 ug/dL — ABNORMAL LOW (ref 28–170)
Saturation Ratios: 6 % — ABNORMAL LOW (ref 10.4–31.8)
TIBC: 403 ug/dL (ref 250–450)
UIBC: 381 ug/dL

## 2020-03-25 LAB — MAGNESIUM: Magnesium: 2 mg/dL (ref 1.7–2.4)

## 2020-03-25 LAB — DIGOXIN LEVEL: Digoxin Level: <0.2 ng/mL — ABNORMAL LOW (ref 0.8–2.0)

## 2020-03-25 LAB — FOLATE: Folate: 41 ng/mL (ref >5.9)

## 2020-03-25 LAB — FERRITIN: Ferritin: 17 ng/mL (ref 11–307)

## 2020-03-25 LAB — PREPARE RBC (CROSSMATCH)

## 2020-03-25 LAB — BRAIN NATRIURETIC PEPTIDE: B Natriuretic Peptide: 598.4 pg/mL — ABNORMAL HIGH (ref 0.0–100.0)

## 2020-03-25 LAB — APTT: aPTT: 41 seconds — ABNORMAL HIGH (ref 24–36)

## 2020-03-25 MED ORDER — ACETAMINOPHEN 325 MG PO TABS
650.0000 mg | ORAL_TABLET | Freq: Four times a day (QID) | ORAL | Status: DC | PRN
  Administered 2020-03-31: 04:00:00 650 mg via ORAL
  Filled 2020-03-25 (×2): qty 2

## 2020-03-25 MED ORDER — VITAMIN D 25 MCG (1000 UNIT) PO TABS
1000.0000 ug | ORAL_TABLET | Freq: Every day | ORAL | Status: DC
  Filled 2020-03-25: qty 1

## 2020-03-25 MED ORDER — IOHEXOL 350 MG/ML SOLN
75.0000 mL | Freq: Once | INTRAVENOUS | Status: AC | PRN
  Administered 2020-03-25: 75 mL via INTRAVENOUS

## 2020-03-25 MED ORDER — POTASSIUM CHLORIDE CRYS ER 20 MEQ PO TBCR
80.0000 meq | EXTENDED_RELEASE_TABLET | Freq: Once | ORAL | Status: AC
  Administered 2020-03-25: 80 meq via ORAL
  Filled 2020-03-25: qty 4

## 2020-03-25 MED ORDER — PANTOPRAZOLE SODIUM 40 MG IV SOLR
40.0000 mg | Freq: Two times a day (BID) | INTRAVENOUS | Status: DC
  Administered 2020-03-25 – 2020-03-31 (×13): 40 mg via INTRAVENOUS
  Filled 2020-03-25 (×12): qty 40

## 2020-03-25 MED ORDER — CHOLECALCIFEROL 25 MCG (1000 UT) PO TABS: 1.0000 | ORAL_TABLET | Freq: Every day | ORAL | Status: DC

## 2020-03-25 MED ORDER — FUROSEMIDE 10 MG/ML IJ SOLN
20.0000 mg | Freq: Once | INTRAMUSCULAR | Status: AC
  Administered 2020-03-25: 17:00:00 20 mg via INTRAVENOUS
  Filled 2020-03-25: qty 2

## 2020-03-25 MED ORDER — SODIUM CHLORIDE 0.9 % IV SOLN: INTRAVENOUS | Status: DC

## 2020-03-25 MED ORDER — HYDRALAZINE HCL 20 MG/ML IJ SOLN: 5.0000 mg | INTRAMUSCULAR | Status: DC | PRN

## 2020-03-25 MED ORDER — B COMPLEX-C PO TABS
1.0000 | ORAL_TABLET | Freq: Every day | ORAL | Status: DC
  Administered 2020-03-26 – 2020-04-02 (×8): 1 via ORAL
  Filled 2020-03-25 (×8): qty 1

## 2020-03-25 MED ORDER — PRAVASTATIN SODIUM 20 MG PO TABS
40.0000 mg | ORAL_TABLET | Freq: Every day | ORAL | Status: DC
  Administered 2020-03-25 – 2020-04-01 (×8): 40 mg via ORAL
  Filled 2020-03-25 (×8): qty 2

## 2020-03-25 MED ORDER — SODIUM CHLORIDE 0.9 % IV SOLN
10.0000 mL/h | Freq: Once | INTRAVENOUS | Status: AC
  Administered 2020-03-25: 10 mL/h via INTRAVENOUS

## 2020-03-25 MED ORDER — AMIODARONE HCL 200 MG PO TABS
200.0000 mg | ORAL_TABLET | Freq: Every day | ORAL | Status: DC
  Administered 2020-03-26 – 2020-04-02 (×8): 200 mg via ORAL
  Filled 2020-03-25 (×8): qty 1

## 2020-03-25 MED ORDER — POTASSIUM CHLORIDE 10 MEQ/100ML IV SOLN
10.0000 meq | INTRAVENOUS | Status: AC
  Administered 2020-03-25 (×2): 10 meq via INTRAVENOUS
  Filled 2020-03-25 (×2): qty 100

## 2020-03-25 MED ORDER — POLYSACCHARIDE IRON COMPLEX 150 MG PO CAPS
150.0000 mg | ORAL_CAPSULE | Freq: Every day | ORAL | Status: DC
  Administered 2020-03-26 – 2020-04-02 (×8): 150 mg via ORAL
  Filled 2020-03-25 (×8): qty 1

## 2020-03-25 MED ORDER — FUROSEMIDE 40 MG PO TABS
40.0000 mg | ORAL_TABLET | Freq: Every day | ORAL | Status: DC
  Administered 2020-03-26: 12:00:00 40 mg via ORAL
  Filled 2020-03-25: qty 1

## 2020-03-25 MED ORDER — VERAPAMIL HCL ER 180 MG PO TBCR
180.0000 mg | EXTENDED_RELEASE_TABLET | Freq: Every day | ORAL | Status: DC
  Administered 2020-03-26: 180 mg via ORAL
  Filled 2020-03-25: qty 1

## 2020-03-25 MED ORDER — MELATONIN 5 MG PO TABS
5.0000 mg | ORAL_TABLET | Freq: Every day | ORAL | Status: DC
  Administered 2020-03-25 – 2020-04-01 (×8): 5 mg via ORAL
  Filled 2020-03-25 (×10): qty 1

## 2020-03-25 MED ORDER — PANTOPRAZOLE SODIUM 40 MG IV SOLR
40.0000 mg | Freq: Once | INTRAVENOUS | Status: AC
  Administered 2020-03-25: 40 mg via INTRAVENOUS
  Filled 2020-03-25: qty 40

## 2020-03-25 MED ORDER — ONDANSETRON HCL 4 MG/2ML IJ SOLN: 4.0000 mg | Freq: Three times a day (TID) | INTRAMUSCULAR | Status: DC | PRN

## 2020-03-25 MED ORDER — FUROSEMIDE 40 MG PO TABS: 40.0000 mg | ORAL_TABLET | Freq: Every day | ORAL | Status: DC

## 2020-03-25 NOTE — ED Triage Notes (Signed)
Pt sent from PCP office for abnormal labs Hgb6.7, K+2.7.Marland Kitchen pt c/o fatigue and staying tired all the time for the past couple of weeks.

## 2020-03-25 NOTE — Consult Note (Signed)
Cephas Darby, MD [address]  [place]  [place], [place] [address]  Main: [phone]  Fax: [phone] Pager: [phone]   Consultation  Referring Provider:     No ref. provider found Primary Care Physician:  [name], MD Primary Gastroenterologist: Althia Forts         Reason for Consultation:     Iron deficiency anemia  Date of Admission:  03/25/2020 Date of Consultation:  03/25/2020         HPI:   [name] [name] is a 87 y.o. female with history of iron deficiency anemia, A. fib on Xarelto electively admitted with low hemoglobin based on outpatient labs.  Patient went to see her PCP yesterday for 1 month history of fatigue as well as dry cough.  Labs revealed hemoglobin 6.7, MCV 79.5, mild leukocytosis, hypokalemia potassium 2.7.  She had normal B12 and vitamin D levels.  In December, she was admitted to [facility] secondary to acute hypoxic respiratory failure, cellulitis of bilateral legs, also found to have severe iron deficiency anemia. She denies any black stools, abdominal pain, nausea, vomiting, rectal bleeding. Patient had elevated BUN with normal creatinine in December.  Her BUN/creatinine are normal during this admission.  Patient is able to receive blood transfusion today.  She is kept n.p.o., started on IV Protonix and GI is consulted for further evaluation  NSAIDs: None  Antiplts/Anticoagulants/Anti thrombotics: Xarelto for history of A. fib, last dose on 2/24  GI Procedures: Colonoscopy by Dr. Gustavo Lah, found to have several tubular adenomas of the colon, resected  Past Medical History:  Diagnosis Date  . Anxiety   . Cancer ([facility])    Squamous Cell Carcinoma (top of head)  . Constipation, chronic   . Deaf, left   . Diverticular disease   . Hearing aid worn   . Hearing deficit   . History of Bell's palsy    left side - resolved - 50 yrs ago  . Hypertension   . Internal hemorrhoids   . Osteopenia   . Pure hypercholesterolemia      Past Surgical History:  Procedure Laterality Date  . CATARACT EXTRACTION W/PHACO Left 11/16/2015   Procedure: CATARACT EXTRACTION PHACO AND INTRAOCULAR LENS PLACEMENT (IOC);  Surgeon: Leandrew Koyanagi, MD;  Location: [facility];  Service: Ophthalmology;  Laterality: Left;  TORIC  . CATARACT EXTRACTION W/PHACO Right 12/07/2015   Procedure: CATARACT EXTRACTION PHACO AND INTRAOCULAR LENS PLACEMENT (IOC);  Surgeon: Leandrew Koyanagi, MD;  Location: [facility];  Service: Ophthalmology;  Laterality: Right;  TORIC  . COLONOSCOPY WITH PROPOFOL N/A 08/10/2014   Procedure: COLONOSCOPY WITH PROPOFOL;  Surgeon: Lollie Sails, MD;  Location[facility] ENDOSCOPY;  Service: Endoscopy;  Laterality: N/A;  . FEMUR FRACTURE SURGERY    . Nasal Polyp Removal      Prior to Admission medications   Medication Sig Start Date End Date Taking? Authorizing Provider  acetaminophen (TYLENOL) 500 MG tablet Take by mouth. 09/16/19  Yes [provider]  amiodarone (PACERONE) 200 MG tablet Take 1 tablet (200 mg total) by mouth daily. 01/17/20  Yes Nicole Kindred A, DO  calcium carbonate 1250 MG capsule Take 2 capsules by mouth 2 (two) times daily with a meal.   Yes [provider]  Cholecalciferol 25 MCG (1000 UT) tablet Take 1 tablet by mouth in the morning and at bedtime.   Yes [provider]  furosemide (LASIX) 40 MG tablet Take 1 tablet (40 mg total) by mouth daily. 01/17/20  Yes Nicole Kindred A, DO  lovastatin (MEVACOR) 40 MG tablet Take 40 mg by mouth at bedtime.   Yes [provider]  lovastatin (MEVACOR) 40 MG tablet Take 1 tablet by mouth at bedtime. 11/18/19  Yes [provider]  polyethylene glycol powder (GLYCOLAX/MIRALAX) 17 GM/SCOOP powder Take by mouth.   Yes [provider]  Rivaroxaban (XARELTO) 15 MG TABS tablet Take 1 tablet (15 mg total) by mouth daily with supper. 01/16/20  Yes Nicole Kindred A, DO  verapamil (CALAN-SR) 180  MG CR tablet Take 180 mg by mouth daily. 09/25/19 09/24/20 Yes [provider]  ascorbic acid (VITAMIN C) 1000 MG tablet Take 1,000 mg by mouth 2 (two) times daily. Patient not taking: Reported on 03/25/2020    [provider]  B Complex-C (B-COMPLEX WITH VITAMIN C) tablet Take 1 tablet by mouth daily.    [provider]  Cholecalciferol 25 MCG (1000 UT) tablet Take by mouth.    [provider]  collagenase (SANTYL) ointment Apply 1 application topically daily. Patient not taking: Reported on 03/25/2020 01/16/20   Nicole Kindred A, DO  digoxin 62.5 MCG TABS Take 0.0625 mg by mouth daily. 01/17/20   Ezekiel Slocumb, DO  folic acid (FOLVITE) 916 MCG tablet Take 800 mcg by mouth daily. Patient not taking: Reported on 03/25/2020    [provider]  Glucosamine HCl (GLUCOSAMINE PO) Take 2 Doses by mouth at bedtime.  Patient not taking: Reported on 03/25/2020    [provider]  iron polysaccharides (NIFEREX) 150 MG capsule Take 1 capsule (150 mg total) by mouth daily. Patient not taking: Reported on 03/25/2020 01/03/20   Sharen Hones, MD  metoprolol tartrate (LOPRESSOR) 50 MG tablet Take 1 tablet (50 mg total) by mouth 2 (two) times daily. 01/16/20   Nicole Kindred A, DO  metoprolol tartrate (LOPRESSOR) 50 MG tablet Take 1 tablet by mouth 2 (two) times daily. Patient not taking: Reported on 03/25/2020 01/16/20   [provider]  polymixin-bacitracin (POLYSPORIN) 500-10000 UNIT/GM OINT ointment Apply 1 application topically daily. To left leg wounds Patient not taking: Reported on 03/25/2020 01/16/20   Nicole Kindred A, DO  potassium chloride SA (KLOR-CON) 20 MEQ tablet Take 1 tablet (20 mEq total) by mouth daily for 14 days. 01/16/20 01/30/20  Nicole Kindred A, DO  selenium 50 MCG TABS tablet Take 50 mcg by mouth daily. Patient not taking: Reported on 03/25/2020    [provider]  tretinoin (RETIN-A) 0.05 % cream Apply 1 Dose  topically at bedtime. Patient not taking: Reported on 03/25/2020    [provider]    Current Facility-Administered Medications:  .  0.9 %  sodium chloride infusion, , Intravenous, Continuous, Ivor Costa, MD, Last Rate: 75 mL/hr at 03/25/20 1331, New Bag at 03/25/20 1331 .  acetaminophen (TYLENOL) tablet 650 mg, 650 mg, Oral, Q6H PRN, Ivor Costa, MD .  hydrALAZINE (APRESOLINE) injection 5 mg, 5 mg, Intravenous, Q2H PRN, Ivor Costa, MD .  ondansetron (ZOFRAN) injection 4 mg, 4 mg, Intravenous, Q8H PRN, Ivor Costa, MD .  pantoprazole (PROTONIX) injection 40 mg, 40 mg, Intravenous, Q12H, Ivor Costa, MD, 40 mg at 03/25/20 1450 .  potassium chloride 10 mEq in 100 mL IVPB, 10 mEq, Intravenous, Q1 Hr x 2, Ivor Costa, MD, Last Rate: 100 mL/hr at 03/25/20 1513, 10 mEq at 03/25/20 1513  Current Outpatient Medications:  .  acetaminophen (TYLENOL) 500 MG tablet, Take by mouth., Disp: , Rfl:  .  amiodarone (PACERONE) 200  MG tablet, Take 1 tablet (200 mg total) by mouth daily., Disp: 30 tablet, Rfl: 1 .  calcium carbonate 1250 MG capsule, Take 2 capsules by mouth 2 (two) times daily with a meal., Disp: , Rfl:  .  Cholecalciferol 25 MCG (1000 UT) tablet, Take 1 tablet by mouth in the morning and at bedtime., Disp: , Rfl:  .  furosemide (LASIX) 40 MG tablet, Take 1 tablet (40 mg total) by mouth daily., Disp: 30 tablet, Rfl: 1 .  lovastatin (MEVACOR) 40 MG tablet, Take 40 mg by mouth at bedtime., Disp: , Rfl:  .  lovastatin (MEVACOR) 40 MG tablet, Take 1 tablet by mouth at bedtime., Disp: , Rfl:  .  polyethylene glycol powder (GLYCOLAX/MIRALAX) 17 GM/SCOOP powder, Take by mouth., Disp: , Rfl:  .  Rivaroxaban (XARELTO) 15 MG TABS tablet, Take 1 tablet (15 mg total) by mouth daily with supper., Disp: 42 tablet, Rfl: 1 .  verapamil (CALAN-SR) 180 MG CR tablet, Take 180 mg by mouth daily., Disp: , Rfl:  .  ascorbic acid (VITAMIN C) 1000 MG tablet, Take 1,000 mg by mouth 2 (two) times daily. (Patient not  taking: Reported on 03/25/2020), Disp: , Rfl:  .  B Complex-C (B-COMPLEX WITH VITAMIN C) tablet, Take 1 tablet by mouth daily., Disp: , Rfl:  .  Cholecalciferol 25 MCG (1000 UT) tablet, Take by mouth., Disp: , Rfl:  .  collagenase (SANTYL) ointment, Apply 1 application topically daily. (Patient not taking: Reported on 03/25/2020), Disp: 15 g, Rfl: 0 .  digoxin 62.5 MCG TABS, Take 0.0625 mg by mouth daily., Disp: 30 tablet, Rfl: 1 .  folic acid (FOLVITE) 263 MCG tablet, Take 800 mcg by mouth daily. (Patient not taking: Reported on 03/25/2020), Disp: , Rfl:  .  Glucosamine HCl (GLUCOSAMINE PO), Take 2 Doses by mouth at bedtime.  (Patient not taking: Reported on 03/25/2020), Disp: , Rfl:  .  iron polysaccharides (NIFEREX) 150 MG capsule, Take 1 capsule (150 mg total) by mouth daily. (Patient not taking: Reported on 03/25/2020), Disp: 30 capsule, Rfl: 0 .  metoprolol tartrate (LOPRESSOR) 50 MG tablet, Take 1 tablet (50 mg total) by mouth 2 (two) times daily., Disp: 60 tablet, Rfl: 1 .  metoprolol tartrate (LOPRESSOR) 50 MG tablet, Take 1 tablet by mouth 2 (two) times daily. (Patient not taking: Reported on 03/25/2020), Disp: , Rfl:  .  polymixin-bacitracin (POLYSPORIN) 500-10000 UNIT/GM OINT ointment, Apply 1 application topically daily. To left leg wounds (Patient not taking: Reported on 03/25/2020), Disp: 28 g, Rfl: 0 .  potassium chloride SA (KLOR-CON) 20 MEQ tablet, Take 1 tablet (20 mEq total) by mouth daily for 14 days., Disp: 14 tablet, Rfl: 0 .  selenium 50 MCG TABS tablet, Take 50 mcg by mouth daily. (Patient not taking: Reported on 03/25/2020), Disp: , Rfl:  .  tretinoin (RETIN-A) 0.05 % cream, Apply 1 Dose topically at bedtime. (Patient not taking: Reported on 03/25/2020), Disp: , Rfl:   No family history on file.   Social History   Tobacco Use  . Smoking status: Former Smoker    Quit date: 10/30/1985    Years since quitting: 34.4  . Smokeless tobacco: Never Used  Substance Use Topics  .  Alcohol use: No  . Drug use: No    Allergies as of 03/25/2020 - Review Complete 03/25/2020  Allergen Reaction Noted  . Bactrim [sulfamethoxazole-trimethoprim] Other (See Comments) 08/09/2014  . Ciprofloxacin Other (See Comments) 08/09/2014  . Pneumococcal vaccines Other (See Comments) 07/26/2019  Review of Systems:    All systems reviewed and negative except where noted in HPI.   Physical Exam:  Vital signs in last 24 hours: Temp:  [97.8 F (36.6 C)] 97.8 F (36.6 C) (02/25 1107) Pulse Rate:  [96] 96 (02/25 1107) Resp:  [17] 17 (02/25 1107) BP: (133)/(62) 133/62 (02/25 1107) SpO2:  [100 %] 100 % (02/25 1107) Weight:  [54.4 kg] 54.4 kg (02/25 1108)   General:   Pleasant, cooperative in NAD Head:  Normocephalic and atraumatic. Eyes:   No icterus.   Conjunctiva pale. PERRLA. Ears: Significantly impaired auditory acuity. Neck:  Supple; no masses or thyroidomegaly Lungs: Respirations even and unlabored. Lungs clear to auscultation bilaterally.   No wheezes, crackles, or rhonchi.  Heart:  Regular rate and rhythm;  Without murmur, clicks, rubs or gallops Abdomen:  Soft, nondistended, nontender. Normal bowel sounds. No appreciable masses or hepatomegaly.  No rebound or guarding.  Rectal:  Not performed. Msk:  Symmetrical without gross deformities.  Strength generalized weakness Extremities:  3+ edema, no cyanosis or clubbing. Neurologic:  Alert and oriented x3;  grossly normal neurologically. Skin:  Intact without significant lesions or rashes. Psych:  Alert and cooperative. Normal affect.  LAB RESULTS: CBC Latest Ref Rng & Units 03/25/2020 01/16/2020 01/15/2020  WBC 4.0 - 10.5 K/uL 10.2 10.4 13.2(H)  Hemoglobin 12.0 - 15.0 g/dL 6.5(L) 10.3(L) 10.4(L)  Hematocrit 36.0 - 46.0 % 22.0(L) 34.7(L) 35.9(L)  Platelets 150 - 400 K/uL 326 390 405(H)    BMET BMP Latest Ref Rng & Units 03/25/2020 01/16/2020 01/14/2020  Glucose 70 - 99 mg/dL 147(H) 97 95  BUN 8 - 23 mg/dL 14 28(H)  27(H)  Creatinine 0.44 - 1.00 mg/dL 0.87 0.77 0.65  Sodium 135 - 145 mmol/L 141 140 136  Potassium 3.5 - 5.1 mmol/L 2.3(LL) 4.3 4.3  Chloride 98 - 111 mmol/L 101 103 104  CO2 22 - 32 mmol/L 29 28 24   Calcium 8.9 - 10.3 mg/dL 8.7(L) 8.9 8.6(L)    LFT Hepatic Function Latest Ref Rng & Units 03/25/2020 01/11/2020 12/31/2019  Total Protein 6.5 - 8.1 g/dL 6.8 7.2 7.2  Albumin 3.5 - 5.0 g/dL 3.3(L) 3.6 3.5  AST 15 - 41 U/L 24 24 21   ALT 0 - 44 U/L 16 19 14   Alk Phosphatase 38 - 126 U/L 67 80 88  Total Bilirubin 0.3 - 1.2 mg/dL 0.7 0.7 0.5     STUDIES: No results found.    Impression / Plan:   [name] [name] is a 87 y.o. female with history of A. fib on Xarelto, bilateral lower extremity cellulitis, iron deficiency anemia who is admitted with acute on chronic iron deficiency anemia with no evidence of active GI bleed  Iron deficiency anemia, most likely secondary to insufficient iron replacement as outpatient Recheck iron panel Recommend blood transfusion to keep hemoglobin above 8 Recommend parenteral iron therapy Continue to hold Xarelto at least for 48 hours in order to perform upper endoscopy Okay to resume diet Continue pantoprazole 40 mg IV twice daily Check H. pylori stool antigen  Thank you for involving me in the care of this patient.      LOS: 0 days   Sherri Sear, MD  03/25/2020, 3:15 PM   Note: This dictation was prepared with Dragon dictation along with smaller phrase technology. Any transcriptional errors that result from this process are unintentional.

## 2020-03-25 NOTE — ED Provider Notes (Signed)
[facility] Emergency Department Provider Note  ____________________________________________   Event Date/Time   First MD Initiated Contact with Patient 03/25/20 1116     (approximate)  I have reviewed the triage vital signs and the nursing notes.   HISTORY  Chief Complaint Abnormal Lab   HPI [name] [name] is a 87 y.o. female with past medical history of anxiety, squamous of carcinoma, HTN, internal hemorrhoids and diverticular disease, A. fib currently anticoagulated on Xarelto and iron deficiency anemia who presents after being referred to the ED for further evaluation of some generalized fatigue and weakness she has been experiencing over the last month and follow-up of labs obtained yesterday were remarkable for hemoglobin of 6.7.  Patient denies any headache, earache, sore throat, vomiting, recent diarrhea, blood in her stool, dark tarry stools, urinary symptoms, blood in her urine, rash, abdominal pain, back pain chest pai, shortness of breath or any other clear acute sick symptoms.  No recent injuries or falls.  She states she is compliant with her medications.         Past Medical History:  Diagnosis Date  . Anxiety   . Cancer ([place])    Squamous Cell Carcinoma (top of head)  . Constipation, chronic   . Deaf, left   . Diverticular disease   . Hearing aid worn   . Hearing deficit   . History of Bell's palsy    left side - resolved - 50 yrs ago  . Hypertension   . Internal hemorrhoids   . Osteopenia   . Pure hypercholesterolemia     Patient Active Problem List   Diagnosis Date Noted  . Symptomatic anemia 03/25/2020  . HLD (hyperlipidemia) 03/25/2020  . Rapid atrial fibrillation ([place]) 01/11/2020  . Chronic anticoagulation 01/11/2020  . Chronic ulcer of left leg ([place]) 01/11/2020  . Iron deficiency anemia 01/11/2020  . Hypokalemia 01/01/2020  . Left leg cellulitis 01/01/2020  . Iron deficiency anemia due to chronic blood loss  01/01/2020  . Wound cellulitis 12/31/2019  . Pelvic fracture ([place]) 08/25/2019  . Permanent atrial fibrillation ([place]) 08/25/2019  . Pain   . Dehydration   . Accidental fall 07/26/2019  . UTI (urinary tract infection) 07/26/2019  . Closed fracture of right inferior pubic ramus ([place]) 07/26/2019  . HTN (hypertension) 07/26/2019  . Ambulatory dysfunction 07/26/2019  . Pubic bone fracture ([place]) 07/26/2019    Past Surgical History:  Procedure Laterality Date  . CATARACT EXTRACTION W/PHACO Left 11/16/2015   Procedure: CATARACT EXTRACTION PHACO AND INTRAOCULAR LENS PLACEMENT (IOC);  Surgeon: Leandrew Koyanagi, MD;  Location: [place];  Service: Ophthalmology;  Laterality: Left;  TORIC  . CATARACT EXTRACTION W/PHACO Right 12/07/2015   Procedure: CATARACT EXTRACTION PHACO AND INTRAOCULAR LENS PLACEMENT (IOC);  Surgeon: Leandrew Koyanagi, MD;  Location: [place];  Service: Ophthalmology;  Laterality: Right;  TORIC  . COLONOSCOPY WITH PROPOFOL N/A 08/10/2014   Procedure: COLONOSCOPY WITH PROPOFOL;  Surgeon: Lollie Sails, MD;  Location: [facility] ENDOSCOPY;  Service: Endoscopy;  Laterality: N/A;  . FEMUR FRACTURE SURGERY    . Nasal Polyp Removal      Prior to Admission medications   Medication Sig Start Date End Date Taking? Authorizing Provider  acetaminophen (TYLENOL) 500 MG tablet Take by mouth. 09/16/19  Yes [provider]  lovastatin (MEVACOR) 40 MG tablet Take 1 tablet by mouth at bedtime. 11/18/19  Yes [provider]  metoprolol tartrate (LOPRESSOR) 50 MG tablet Take 1 tablet by mouth 2 (two) times  daily. 01/16/20  Yes [provider]  amiodarone (PACERONE) 200 MG tablet Take 1 tablet (200 mg total) by mouth daily. 01/17/20   Ezekiel Slocumb, DO  ascorbic acid (VITAMIN C) 1000 MG tablet Take 1,000 mg by mouth 2 (two) times daily.    [provider]  B Complex-C (B-COMPLEX WITH VITAMIN C) tablet Take 1 tablet by mouth daily.     [provider]  calcium carbonate 1250 MG capsule Take 2 capsules by mouth 2 (two) times daily with a meal.    [provider]  Cholecalciferol 25 MCG (1000 UT) tablet Take 1 tablet by mouth in the morning and at bedtime.    [provider]  Cholecalciferol 25 MCG (1000 UT) tablet Take by mouth.    [provider]  collagenase (SANTYL) ointment Apply 1 application topically daily. 01/16/20   Nicole Kindred A, DO  digoxin 62.5 MCG TABS Take 0.0625 mg by mouth daily. 01/17/20   Ezekiel Slocumb, DO  folic acid (FOLVITE) 389 MCG tablet Take 800 mcg by mouth daily.    [provider]  furosemide (LASIX) 40 MG tablet Take 1 tablet (40 mg total) by mouth daily. 01/17/20   Nicole Kindred A, DO  Glucosamine HCl (GLUCOSAMINE PO) Take 2 Doses by mouth at bedtime.     [provider]  iron polysaccharides (NIFEREX) 150 MG capsule Take 1 capsule (150 mg total) by mouth daily. 01/03/20   Sharen Hones, MD  lovastatin (MEVACOR) 40 MG tablet Take 40 mg by mouth at bedtime.    [provider]  metoprolol tartrate (LOPRESSOR) 50 MG tablet Take 1 tablet (50 mg total) by mouth 2 (two) times daily. 01/16/20   Nicole Kindred A, DO  polyethylene glycol powder (GLYCOLAX/MIRALAX) 17 GM/SCOOP powder Take by mouth.    [provider]  polymixin-bacitracin (POLYSPORIN) 500-10000 UNIT/GM OINT ointment Apply 1 application topically daily. To left leg wounds 01/16/20   Nicole Kindred A, DO  potassium chloride SA (KLOR-CON) 20 MEQ tablet Take 1 tablet (20 mEq total) by mouth daily for 14 days. 01/16/20 01/30/20  Ezekiel Slocumb, DO  Rivaroxaban (XARELTO) 15 MG TABS tablet Take 1 tablet (15 mg total) by mouth daily with supper. 01/16/20   Nicole Kindred A, DO  selenium 50 MCG TABS tablet Take 50 mcg by mouth daily.    [provider]  tretinoin (RETIN-A) 0.05 % cream Apply 1 Dose topically at bedtime.    [provider]  verapamil  (CALAN-SR) 180 MG CR tablet Take 180 mg by mouth daily. 09/25/19 09/24/20  [provider]    Allergies Bactrim [sulfamethoxazole-trimethoprim], Ciprofloxacin, and Pneumococcal vaccines  No family history on file.  Social History Social History   Tobacco Use  . Smoking status: Former Smoker    Quit date: 10/30/1985    Years since quitting: 34.4  . Smokeless tobacco: Never Used  Substance Use Topics  . Alcohol use: No  . Drug use: No    Review of Systems  Review of Systems  Constitutional: Positive for malaise/fatigue. Negative for chills and fever.  HENT: Negative for sore throat.   Eyes: Negative for pain.  Respiratory: Positive for cough ( on and off over last several days, had CXR yesterday and was told it was ok). Negative for stridor.   Cardiovascular: Negative for chest pain.  Gastrointestinal: Negative for vomiting.  Genitourinary: Negative for dysuria.  Musculoskeletal: Negative for myalgias.  Skin: Negative for rash.  Neurological: Negative for seizures, loss  of consciousness and headaches.  Psychiatric/Behavioral: Negative for suicidal ideas.  All other systems reviewed and are negative.     ____________________________________________   PHYSICAL EXAM:  VITAL SIGNS: ED Triage Vitals  Enc Vitals Group     BP 03/25/20 1107 133/62     Pulse Rate 03/25/20 1107 96     Resp 03/25/20 1107 17     Temp 03/25/20 1107 97.8 F (36.6 C)     Temp Source 03/25/20 1107 Oral     SpO2 03/25/20 1107 100 %     Weight 03/25/20 1108 120 lb (54.4 kg)     Height 03/25/20 1108 5\' 3"  (1.6 m)     Head Circumference --      Peak Flow --      Pain Score 03/25/20 1107 0     Pain Loc --      Pain Edu? --      Excl. in [place]? --    Vitals:   03/25/20 1107  BP: 133/62  Pulse: 96  Resp: 17  Temp: 97.8 F (36.6 C)  SpO2: 100%   Physical Exam Vitals and nursing note reviewed. Exam conducted with a chaperone present.  Constitutional:      General: She is not in  acute distress.    Appearance: She is well-developed and well-nourished.  HENT:     Head: Normocephalic and atraumatic.     Right Ear: External ear normal.     Left Ear: External ear normal.     Nose: Nose normal.  Eyes:     Conjunctiva/sclera: Conjunctivae normal.  Cardiovascular:     Rate and Rhythm: Normal rate and regular rhythm.     Heart sounds: No murmur heard.   Pulmonary:     Effort: Pulmonary effort is normal. No respiratory distress.     Breath sounds: Normal breath sounds.  Abdominal:     Palpations: Abdomen is soft.     Tenderness: There is no abdominal tenderness.  Genitourinary:    Rectum: Guaiac result positive. External hemorrhoid present.     Comments: No actively bleeding hemorrhoids or fissures.  Brown stool is Hemoccult positive. Musculoskeletal:        General: No edema.     Cervical back: Neck supple.  Skin:    General: Skin is warm and dry.     Coloration: Skin is pale.  Neurological:     Mental Status: She is alert.  Psychiatric:        Mood and Affect: Mood and affect and mood normal.      ____________________________________________   LABS (all labs ordered are listed, but only abnormal results are displayed)  Labs Reviewed  COMPREHENSIVE METABOLIC PANEL - Abnormal; Notable for the following components:      Result Value   Potassium 2.3 (*)    Glucose, Bld 147 (*)    Calcium 8.7 (*)    Albumin 3.3 (*)    All other components within normal limits  RETICULOCYTES - Abnormal; Notable for the following components:   RBC. 2.82 (*)    All other components within normal limits  CBC WITH DIFFERENTIAL/PLATELET - Abnormal; Notable for the following components:   RBC 2.84 (*)    Hemoglobin 6.5 (*)    HCT 22.0 (*)    MCV 77.5 (*)    MCH 22.9 (*)    MCHC 29.5 (*)    RDW 21.9 (*)    nRBC 0.3 (*)    All other components within normal limits  PROTIME-INR -  Abnormal; Notable for the following components:   Prothrombin Time 23.4 (*)    INR 2.2  (*)    All other components within normal limits  RESP PANEL BY RT-PCR (FLU A&B, COVID) ARPGX2  MAGNESIUM  CBC WITH DIFFERENTIAL/PLATELET  URINALYSIS, COMPLETE (UACMP) WITH MICROSCOPIC  DIGOXIN LEVEL  BRAIN NATRIURETIC PEPTIDE  CBC  CBC  CBC  APTT  TYPE AND SCREEN  PREPARE RBC (CROSSMATCH)   ____________________________________________  EKG  ____________________________________________  RADIOLOGY  ED MD interpretation:  Official radiology report(s): No results found.  ____________________________________________   PROCEDURES  Procedure(s) performed (including Critical Care):  .Critical Care Performed by: Lucrezia Starch, MD Authorized by: Lucrezia Starch, MD   Critical care provider statement:    Critical care time (minutes):  45   Critical care was necessary to treat or prevent imminent or life-threatening deterioration of the following conditions:  Circulatory failure   Critical care was time spent personally by me on the following activities:  Discussions with consultants, evaluation of patient's response to treatment, examination of patient, ordering and performing treatments and interventions, ordering and review of laboratory studies, ordering and review of radiographic studies, pulse oximetry, re-evaluation of patient's condition, obtaining history from patient or surrogate and review of old charts     ____________________________________________   INITIAL IMPRESSION / [place] / ED COURSE        Patient presents with above-stated reexam for assessment of approximately month of worsening general fatigue and weakness.  She had labs obtained yesterday that showed hemoglobin of 6.7 as well as hypokalemia.  On arrival she denies any acute complaints and denies any history of bloody stools or melanotic stools.  She is guaiac positive although is no active bleeding visualized on exam.  She is afebrile hemodynamically stable.  Overall I was able  to see this result from yesterday and this was confirmed on recheck today with CBC showing hemoglobin of 6.5.  No leukocytosis and platelets are unremarkable.  Given she is anticoagulated on Xarelto and is Hemoccult positive and concern for GI source.  CMP shows K of 2.3 but no other significant electrolyte or metabolic derangements.  No evidence of cholestasis hepatitis.  We'll plan to obtain CTA to assess for any evidence of bleeding diverticuli or other clear source of bleeding.  Discussed patient with on-call gastroenterologist Dr. Marius Ditch who will make some recommendations.  Patient ordered 1 unit of PRBCs to be transfused in the ED.  Potassium repleted.  I'll plan to admit to medicine for further evaluation management.   ____________________________________________   FINAL CLINICAL IMPRESSION(S) / ED DIAGNOSES  Final diagnoses:  Hypokalemia  Symptomatic anemia    Medications  potassium chloride SA (KLOR-CON) CR tablet 80 mEq (has no administration in time range)  potassium chloride 10 mEq in 100 mL IVPB (has no administration in time range)  pantoprazole (PROTONIX) injection 40 mg (has no administration in time range)  ondansetron (ZOFRAN) injection 4 mg (has no administration in time range)  acetaminophen (TYLENOL) tablet 650 mg (has no administration in time range)  hydrALAZINE (APRESOLINE) injection 5 mg (has no administration in time range)  0.9 %  sodium chloride infusion (has no administration in time range)  0.9 %  sodium chloride infusion (10 mL/hr Intravenous New Bag/Given 03/25/20 1230)  pantoprazole (PROTONIX) injection 40 mg (40 mg Intravenous Given 03/25/20 1224)  iohexol (OMNIPAQUE) 350 MG/ML injection 75 mL (75 mLs Intravenous Contrast Given 03/25/20 1239)     ED Discharge Orders  None       Note:  This document was prepared using Dragon voice recognition software and may include unintentional dictation errors.   Lucrezia Starch, MD 03/25/20 [phone]

## 2020-03-25 NOTE — H&P (Signed)
History and Physical    [name] [name]:[id number] DOB: [birth year] DOA: 03/25/2020  Referring MD/NP/PA:   PCP: [name], MD   Patient coming from:  The patient is coming from home.  At baseline, pt is independent for most of ADL.        Chief Complaint: Fatigue and abnormal lab  HPI: [name] [name] is a 87 y.o. female with medical history significant of atrial fibrillation on Xarelto, hypertension, hyperlipidemia, hemorrhoids, hearing loss, diverticulosis, iron deficiency anemia, Bell's palsy, anxiety, who presents with fatigue and abnormal lab.  Patient states that she has been having generalized weakness and fatigue for almost 4 weeks, which has progressively worsened in the past several days.  Patient was seen by PCP yesterday and found to have hemoglobin 6.7 and potassium 2.7.  Patient denies rectal bleeding or dark stool.  Per her son, patient had some abdominal pain 2 weeks ago, but today patient denies abdominal pain.  No nausea, vomiting or diarrhea.  Patient denies chest pain, shortness breath, cough, fever or chills.  No symptoms of UTI.  No unilateral numbness or tingling in extremities.   ED Course: pt was found to have hemoglobin 6.5 (13.3 on 01/16/2020), pending Covid PCR, positive FOBT, INR 2.2, potassium 2.3, magnesium 2.0, temperature normal, blood pressure 133/62, heart rate 96, RR 17, oxygen saturation 100% on room air.  Pending CT angiogram of abdomen/pelvis.  Dr. Marius Ditch of GI is consulted.   Review of Systems:   General: no fevers, chills, no body weight gain, has fatigue HEENT: no blurry vision, hearing changes or sore throat Respiratory: no dyspnea, coughing, wheezing CV: no chest pain, no palpitations GI: no nausea, vomiting, abdominal pain, diarrhea, constipation GU: no dysuria, burning on urination, increased urinary frequency, hematuria  Ext: has leg edema Neuro: no unilateral weakness, numbness, or tingling, no vision change or hearing  loss Skin: no rash, no skin tear. MSK: No muscle spasm, no deformity, no limitation of range of movement in spin Heme: No easy bruising.  Travel history: No recent long distant travel.  Allergy:  Allergies  Allergen Reactions  . Bactrim [Sulfamethoxazole-Trimethoprim] Other (See Comments)    Hearing loss  . Ciprofloxacin Other (See Comments)    Flu-like symptoms  . Pneumococcal Vaccines Other (See Comments)    Flu-like symptoms    Past Medical History:  Diagnosis Date  . Anxiety   . Cancer ([place])    Squamous Cell Carcinoma (top of head)  . Constipation, chronic   . Deaf, left   . Diverticular disease   . Hearing aid worn   . Hearing deficit   . History of Bell's palsy    left side - resolved - 50 yrs ago  . Hypertension   . Internal hemorrhoids   . Osteopenia   . Pure hypercholesterolemia     Past Surgical History:  Procedure Laterality Date  . CATARACT EXTRACTION W/PHACO Left 11/16/2015   Procedure: CATARACT EXTRACTION PHACO AND INTRAOCULAR LENS PLACEMENT (IOC);  Surgeon: Leandrew Koyanagi, MD;  Location: [place];  Service: Ophthalmology;  Laterality: Left;  TORIC  . CATARACT EXTRACTION W/PHACO Right 12/07/2015   Procedure: CATARACT EXTRACTION PHACO AND INTRAOCULAR LENS PLACEMENT (IOC);  Surgeon: Leandrew Koyanagi, MD;  Location: [facility];  Service: Ophthalmology;  Laterality: Right;  TORIC  . COLONOSCOPY WITH PROPOFOL N/A 08/10/2014   Procedure: COLONOSCOPY WITH PROPOFOL;  Surgeon: Lollie Sails, MD;  Location: [facility] ENDOSCOPY;  Service: Endoscopy;  Laterality: N/A;  . FEMUR FRACTURE SURGERY    .  Nasal Polyp Removal      Social History:  reports that she quit smoking about 34 years ago. She has never used smokeless tobacco. She reports that she does not drink alcohol and does not use drugs.  Family History:  Family History  Problem Relation Age of Onset  . Colon cancer Mother      Prior to Admission medications   Medication Sig  Start Date End Date Taking? Authorizing Provider  amiodarone (PACERONE) 200 MG tablet Take 1 tablet (200 mg total) by mouth daily. 01/17/20   Ezekiel Slocumb, DO  ascorbic acid (VITAMIN C) 1000 MG tablet Take 1,000 mg by mouth 2 (two) times daily.    [provider]  B Complex-C (B-COMPLEX WITH VITAMIN C) tablet Take 1 tablet by mouth daily.    [provider]  calcium carbonate 1250 MG capsule Take 2 capsules by mouth 2 (two) times daily with a meal.    [provider]  Cholecalciferol 25 MCG (1000 UT) tablet Take 1 tablet by mouth in the morning and at bedtime.    [provider]  collagenase (SANTYL) ointment Apply 1 application topically daily. 01/16/20   Nicole Kindred A, DO  digoxin 62.5 MCG TABS Take 0.0625 mg by mouth daily. 01/17/20   Ezekiel Slocumb, DO  folic acid (FOLVITE) 403 MCG tablet Take 800 mcg by mouth daily.    [provider]  furosemide (LASIX) 40 MG tablet Take 1 tablet (40 mg total) by mouth daily. 01/17/20   Nicole Kindred A, DO  Glucosamine HCl (GLUCOSAMINE PO) Take 2 Doses by mouth at bedtime.     [provider]  iron polysaccharides (NIFEREX) 150 MG capsule Take 1 capsule (150 mg total) by mouth daily. 01/03/20   Sharen Hones, MD  lovastatin (MEVACOR) 40 MG tablet Take 40 mg by mouth at bedtime.    [provider]  metoprolol tartrate (LOPRESSOR) 50 MG tablet Take 1 tablet (50 mg total) by mouth 2 (two) times daily. 01/16/20   Ezekiel Slocumb, DO  polymixin-bacitracin (POLYSPORIN) 500-10000 UNIT/GM OINT ointment Apply 1 application topically daily. To left leg wounds 01/16/20   Nicole Kindred A, DO  potassium chloride SA (KLOR-CON) 20 MEQ tablet Take 1 tablet (20 mEq total) by mouth daily for 14 days. 01/16/20 01/30/20  Ezekiel Slocumb, DO  Rivaroxaban (XARELTO) 15 MG TABS tablet Take 1 tablet (15 mg total) by mouth daily with supper. 01/16/20   Nicole Kindred A, DO  selenium 50 MCG TABS tablet Take  50 mcg by mouth daily.    [provider]  tretinoin (RETIN-A) 0.05 % cream Apply 1 Dose topically at bedtime.    [provider]  verapamil (CALAN-SR) 180 MG CR tablet Take 180 mg by mouth daily. 09/25/19 09/24/20  [provider]    Physical Exam: Vitals:   03/25/20 1107 03/25/20 1108 03/25/20 1539  BP: 133/62  133/76  Pulse: 96  86  Resp: 17  18  Temp: 97.8 F (36.6 C)  98.6 F (37 C)  TempSrc: Oral    SpO2: 100%  96%  Weight:  54.4 kg 54.2 kg  Height:  5\' 3"  (1.6 m)    General: Not in acute distress.  Pale looking HEENT:       Eyes: PERRL, EOMI, no scleral icterus.       ENT: No discharge from the ears and nose, no pharynx injection, no tonsillar enlargement.        Neck: No JVD,  no bruit, no mass felt. Heme: No neck lymph node enlargement. Cardiac: S1/S2, RRR, No murmurs, No gallops or rubs. Respiratory: No rales, wheezing, rhonchi or rubs. GI: Soft, nondistended, nontender, no rebound pain, no organomegaly, BS present. GU: No hematuria Ext: has 1+ pitting leg edema bilaterally. 1+DP/PT pulse bilaterally. Musculoskeletal: No joint deformities, No joint redness or warmth, no limitation of ROM in spin. Skin: No rashes.  Neuro: Alert, oriented X3, cranial nerves II-XII grossly intact, moves all extremities normally.  Psych: Patient is not psychotic, no suicidal or hemocidal ideation.  Labs on Admission: I have personally reviewed following labs and imaging studies  CBC: Recent Labs  Lab 03/25/20 1133  WBC 10.2  NEUTROABS 6.8  HGB 6.5*  HCT 22.0*  MCV 77.5*  PLT 315   Basic Metabolic Panel: Recent Labs  Lab 03/25/20 1133  NA 141  K 2.3*  CL 101  CO2 29  GLUCOSE 147*  BUN 14  CREATININE 0.87  CALCIUM 8.7*  MG 2.0   GFR: Estimated Creatinine Clearance: 36.3 mL/min (by C-G formula based on SCr of 0.87 mg/dL). Liver Function Tests: Recent Labs  Lab 03/25/20 1133  AST 24  ALT 16  ALKPHOS 67  BILITOT 0.7  PROT 6.8  ALBUMIN  3.3*   No results for input(s): LIPASE, AMYLASE in the last 168 hours. No results for input(s): AMMONIA in the last 168 hours. Coagulation Profile: Recent Labs  Lab 03/25/20 1133  INR 2.2*   Cardiac Enzymes: No results for input(s): CKTOTAL, CKMB, CKMBINDEX, TROPONINI in the last 168 hours. BNP (last 3 results) No results for input(s): PROBNP in the last 8760 hours. HbA1C: No results for input(s): HGBA1C in the last 72 hours. CBG: No results for input(s): GLUCAP in the last 168 hours. Lipid Profile: No results for input(s): CHOL, HDL, LDLCALC, TRIG, CHOLHDL, LDLDIRECT in the last 72 hours. Thyroid Function Tests: No results for input(s): TSH, T4TOTAL, FREET4, T3FREE, THYROIDAB in the last 72 hours. Anemia Panel: Recent Labs    03/25/20 1133  FERRITIN 17  TIBC 403  IRON 22*  RETICCTPCT 2.1   Urine analysis:    Component Value Date/Time   COLORURINE YELLOW (A) 03/25/2020 1133   APPEARANCEUR CLOUDY (A) 03/25/2020 1133   APPEARANCEUR Cloudy 05/31/2012 2019   LABSPEC 1.018 03/25/2020 1133   LABSPEC 1.011 05/31/2012 2019   PHURINE 9.0 (H) 03/25/2020 1133   GLUCOSEU NEGATIVE 03/25/2020 1133   GLUCOSEU 50 mg/dL 05/31/2012 2019   HGBUR NEGATIVE 03/25/2020 1133   [place] 03/25/2020 1133   BILIRUBINUR Negative 05/31/2012 2019   KETONESUR NEGATIVE 03/25/2020 1133   PROTEINUR NEGATIVE 03/25/2020 1133   NITRITE NEGATIVE 03/25/2020 1133   LEUKOCYTESUR NEGATIVE 03/25/2020 1133   LEUKOCYTESUR Negative 05/31/2012 2019   Sepsis Labs: @LABRCNTIP (procalcitonin:4,lacticidven:4) ) Recent Results (from the past 240 hour(s))  Resp Panel by RT-PCR (Flu A&B, Covid) Nasopharyngeal Swab     Status: None   Collection Time: 03/25/20 11:33 AM   Specimen: Nasopharyngeal Swab; Nasopharyngeal(NP) swabs in vial transport medium  Result Value Ref Range Status   SARS Coronavirus 2 by RT PCR NEGATIVE NEGATIVE Final    Comment: (NOTE) SARS-CoV-2 target nucleic acids are NOT  DETECTED.  The SARS-CoV-2 RNA is generally detectable in upper respiratory specimens during the acute phase of infection. The lowest concentration of SARS-CoV-2 viral copies this assay can detect is 138 copies/mL. A negative result does not preclude SARS-Cov-2 infection and should not be used as the sole basis for treatment or other patient management decisions.  A negative result may occur with  improper specimen collection/handling, submission of specimen other than nasopharyngeal swab, presence of viral mutation(s) within the areas targeted by this assay, and inadequate number of viral copies(<138 copies/mL). A negative result must be combined with clinical observations, patient history, and epidemiological information. The expected result is Negative.  Fact Sheet for Patients:  EntrepreneurPulse.com.au  Fact Sheet for Healthcare Providers:  IncredibleEmployment.be  This test is no t yet approved or cleared by the [place] FDA and  has been authorized for detection and/or diagnosis of SARS-CoV-2 by FDA under an Emergency Use Authorization (EUA). This EUA will remain  in effect (meaning this test can be used) for the duration of the COVID-19 declaration under Section 564(b)(1) of the Act, 21 U.S.C.section 360bbb-3(b)(1), unless the authorization is terminated  or revoked sooner.       Influenza A by PCR NEGATIVE NEGATIVE Final   Influenza B by PCR NEGATIVE NEGATIVE Final    Comment: (NOTE) The Xpert Xpress SARS-CoV-2/FLU/RSV plus assay is intended as an aid in the diagnosis of influenza from Nasopharyngeal swab specimens and should not be used as a sole basis for treatment. Nasal washings and aspirates are unacceptable for Xpert Xpress SARS-CoV-2/FLU/RSV testing.  Fact Sheet for Patients: EntrepreneurPulse.com.au  Fact Sheet for Healthcare Providers: IncredibleEmployment.be  This test is not yet  approved or cleared by the [place] FDA and has been authorized for detection and/or diagnosis of SARS-CoV-2 by FDA under an Emergency Use Authorization (EUA). This EUA will remain in effect (meaning this test can be used) for the duration of the COVID-19 declaration under Section 564(b)(1) of the Act, 21 U.S.C. section 360bbb-3(b)(1), unless the authorization is terminated or revoked.  Performed at [facility], [address], [place], [place] [address]      Radiological Exams on Admission: CT Angio Abd/Pel W and/or Wo Contrast  Result Date: 03/25/2020 CLINICAL DATA:  Fatigue.  Anemia.  Evaluate for GI bleeding. EXAM: CTA ABDOMEN AND PELVIS WITHOUT AND WITH CONTRAST TECHNIQUE: Multidetector CT imaging of the abdomen and pelvis was performed using the standard protocol during bolus administration of intravenous contrast. Multiplanar reconstructed images and MIPs were obtained and reviewed to evaluate the vascular anatomy. CONTRAST:  60mL OMNIPAQUE IOHEXOL 350 MG/ML SOLN COMPARISON:  CT abdomen pelvis-08/14/2015 FINDINGS: VASCULAR Aorta: Calcified atherosclerotic plaque within normal caliber abdominal aorta. No evidence of abdominal aortic dissection or periaortic stranding this nongated examination. Celiac: There is a minimal amount of calcified atherosclerotic plaque involving the origin the celiac artery, not resulting in a hemodynamically significant narrowing. SMA: Widely patent without hemodynamically significant narrowing. Conventional branching pattern. The distal tributaries the SMA appear widely patent without discrete lumen filling defect to suggest distal embolism. Renals: There is a tiny accessory left renal artery supplies the inferior pole of the left kidney. Solitary right-sided renal artery. There is a minimal amount of calcified atherosclerotic plaque involving the origin of the bilateral dominant renal arteries, not resulting in a hemodynamically significant  stenosis. No vessel irregularity to suggest FMD. IMA: Remains patent. Inflow: Eccentric calcified atherosclerotic plaque involving the proximal aspect of the right common iliac artery approaches 50% luminal narrowing (axial image 131, series 5; coronal image 35, series 9). The left common iliac artery is mildly disease though patent and of normal caliber. The bilateral internal iliac arteries are diseased though patent and of normal caliber. The bilateral external iliac arteries are tortuous though of normal caliber and widely patent without a hemodynamically significant narrowing. Proximal Outflow: There  is a minimal amount of eccentric calcified atherosclerotic plaque involving the bilateral common femoral arteries, not resulting in hemodynamically significant stenosis. The imaged portions of the bilateral deep and superficial femoral arteries are widely patent without a hemodynamically significant narrowing. Veins: The IVC and pelvic venous systems appear widely patent. Review of the MIP images confirms the above findings. _________________________________________________________ NON-VASCULAR Lower chest: Limited visualization of lower thorax demonstrates small to moderate-sized bilateral pleural effusions with associated subsegmental consolidative opacities and air bronchograms and associated partial atelectasis of the bilateral lower lobes. Cardiomegaly. There is diffuse decreased attenuation of the intra cardiac blood pool suggestive of anemia. Coronary artery calcifications. No pericardial effusion. Hepatobiliary: Normal hepatic contour. Redemonstrated multiple hypoattenuating nonenhancing hepatic cysts including dominant approximately 2.1 cm cyst within the dome of the left lobe of the liver. Note is made of a punctate granuloma within the anterior segment of the right lobe of the liver (image 24, series 2), unchanged. No discrete worrisome hyperenhancing hepatic lesions. Normal appearance of the gallbladder  given degree distention. No discrete radiopaque gallstones. No intra or extrahepatic biliary ductal dilatation. No ascites. Pancreas: Normal appearance of the pancreas. Spleen: Normal appearance of the spleen. Adrenals/Urinary Tract: There is symmetric enhancement of the bilateral kidneys. No evidence of nephrolithiasis. There is a minimal amount of grossly symmetric bilateral likely age and body habitus related perinephric stranding. No urine obstruction. No discrete renal lesions. There is mild thickening of the medial limb and crux of the left adrenal gland without discrete nodule, similar to the 2017 examination. Normal appearance of the right adrenal gland. Normal appearance of the urinary bladder. Stomach/Bowel: No discrete areas of intraluminal contrast extravasation to suggest the etiology of clinical suspicion of GI bleeding. Moderate to large colonic stool burden without evidence of enteric obstruction. Scattered radiopaque pill fragments are seen throughout the small intestine. Colonic diverticulosis without evidence of superimposed acute diverticulitis. Small hiatal hernia. There is mild diffuse thickening the stomach wall, potentially accentuated due to underdistention. Otherwise, there are no discrete areas of bowel wall thickening. Normal appearance of the terminal ileum. The appendix is not visualized, however there is no pericecal inflammatory change. No pneumoperitoneum, pneumatosis or portal venous gas. Lymphatic: No bulky retroperitoneal, mesenteric, pelvic or inguinal lymphadenopathy. Reproductive: Suspected degenerating fibroid within the left side of the uterine fundus. Otherwise, normal appearance of the pelvic organs for age. No free fluid the pelvic cul-de-sac. Other: Minimal amount of subcutaneous edema about the midline of the low back. Musculoskeletal: No acute or aggressive osseous abnormalities. Post intramedullary fixation of the right femur with dynamic screw fixation of the right  femoral neck. Mild-to-moderate multilevel lumbar spine DDD worse at L4-L5 and L5-S1. IMPRESSION: Vascular Impression: 1. No discrete areas of intraluminal contrast extravasation to suggest the source of clinical suspicion of GI bleeding. 2. Scattered atherosclerotic plaque within normal caliber abdominal aorta. Aortic Atherosclerosis (ICD10-I70.0). 3. Cardiomegaly.  Coronary artery calcifications. 4. Suspected hemodynamically significant narrowing involving the right common iliac artery. Correlation for symptoms of right lower extremity PAD is advised. Nonvascular Impression: 1. Diffuse thickening of the stomach wall, nonspecific and while potentially accentuated due to underdistention, could be seen in the setting of a gastritis. Further evaluation with upper endoscopy could be performed as indicated. 2. Small to moderate-sized bilateral effusions with associated partial atelectasis of the bilateral lower lobes. 3. Large colonic stool burden without evidence of enteric obstruction. 4. Colonic diverticulosis without evidence of superimposed acute diverticulitis. 5. Small hiatal hernia. Electronically Signed   By: Eldridge Abrahams.D.  On: 03/25/2020 15:32     EKG:  Not done in ED, will get one.   Assessment/Plan Principal Problem:   Symptomatic anemia Active Problems:   HTN (hypertension)   Permanent atrial fibrillation (HCC)   Hypokalemia   Iron deficiency anemia   HLD (hyperlipidemia)   Symptomatic anemia due to GI bleeding; FOBT positive.  Hgb 10.3 -->6.5. Dr. Marius Ditch of GI is consulted.  Since patient is on Xarelto, last dose was last night, cannot do procedure in 2 days.  Will let patient eat food today per Dr. Marius Ditch.   - will place in med-surg bed obs - hold Xarelto - transfuse 1 units of blood now - IVF: 75 mL/hr of NS - Start IV pantoprazole 40 mg bid - Zofran IV for nausea - Avoid NSAIDs and SQ heparin - Maintain IV access (2 large bore IVs if possible). - Monitor closely and follow  q6h cbc, transfuse as necessary, if Hgb<7.0 - La[facility]: INR, PTT and type screen  HTN (hypertension) -IV hydralazine as needed -pt is also on lasix -Verapamil  Permanent atrial fibrillation (HCC) -Hold Xarelto -Continue amiodarone -Patient is not sure if she is taking digoxin currently, will hold  Hypokalemia: Potassium 2.3 -Repleted potassium -Magnesium level 2.0  Iron deficiency anemia -Continue iron supplement  HLD (hyperlipidemia) -Pravastatin    DVT ppx: SCD Code Status: Partial code per her son (OK for CPR, but no intubation). Family Communication:   Yes, patient's son by phone Disposition Plan:  Anticipate discharge back to previous environment Consults called:  Dr. Marius Ditch Admission status and Level of care: Med-Surg:    Med-surg bed for    Status is: Observation  The patient remains OBS appropriate and will d/c before 2 midnights.  Dispo: The patient is from: Home              Anticipated d/c is to: Home              Patient currently is not medically stable to d/c.   Difficult to place patient No          Date of Service 03/25/2020    Ivor Costa [facility]   If 7PM-7AM, please contact night-coverage [email or web address] 03/25/2020, 3:47 PM

## 2020-03-25 NOTE — ED Notes (Signed)
Critical potassium 2.3. Charlann Boxer MD notified.

## 2020-03-26 ENCOUNTER — Observation Stay: Payer: Medicare Other

## 2020-03-26 DIAGNOSIS — Z85828 Personal history of other malignant neoplasm of skin: Secondary | ICD-10-CM | POA: Diagnosis not present

## 2020-03-26 DIAGNOSIS — E785 Hyperlipidemia, unspecified: Secondary | ICD-10-CM | POA: Diagnosis present

## 2020-03-26 DIAGNOSIS — Z20822 Contact with and (suspected) exposure to covid-19: Secondary | ICD-10-CM | POA: Diagnosis present

## 2020-03-26 DIAGNOSIS — Z881 Allergy status to other antibiotic agents status: Secondary | ICD-10-CM | POA: Diagnosis not present

## 2020-03-26 DIAGNOSIS — J90 Pleural effusion, not elsewhere classified: Secondary | ICD-10-CM | POA: Diagnosis present

## 2020-03-26 DIAGNOSIS — Z7901 Long term (current) use of anticoagulants: Secondary | ICD-10-CM | POA: Diagnosis not present

## 2020-03-26 DIAGNOSIS — I4821 Permanent atrial fibrillation: Secondary | ICD-10-CM | POA: Diagnosis present

## 2020-03-26 DIAGNOSIS — K5909 Other constipation: Secondary | ICD-10-CM | POA: Diagnosis present

## 2020-03-26 DIAGNOSIS — R911 Solitary pulmonary nodule: Secondary | ICD-10-CM | POA: Diagnosis present

## 2020-03-26 DIAGNOSIS — J189 Pneumonia, unspecified organism: Secondary | ICD-10-CM | POA: Diagnosis not present

## 2020-03-26 DIAGNOSIS — Z9842 Cataract extraction status, left eye: Secondary | ICD-10-CM | POA: Diagnosis not present

## 2020-03-26 DIAGNOSIS — I110 Hypertensive heart disease with heart failure: Secondary | ICD-10-CM | POA: Diagnosis present

## 2020-03-26 DIAGNOSIS — H9192 Unspecified hearing loss, left ear: Secondary | ICD-10-CM | POA: Diagnosis present

## 2020-03-26 DIAGNOSIS — D509 Iron deficiency anemia, unspecified: Secondary | ICD-10-CM

## 2020-03-26 DIAGNOSIS — F419 Anxiety disorder, unspecified: Secondary | ICD-10-CM | POA: Diagnosis present

## 2020-03-26 DIAGNOSIS — E7800 Pure hypercholesterolemia, unspecified: Secondary | ICD-10-CM | POA: Diagnosis present

## 2020-03-26 DIAGNOSIS — M8580 Other specified disorders of bone density and structure, unspecified site: Secondary | ICD-10-CM | POA: Diagnosis present

## 2020-03-26 DIAGNOSIS — D500 Iron deficiency anemia secondary to blood loss (chronic): Secondary | ICD-10-CM | POA: Diagnosis present

## 2020-03-26 DIAGNOSIS — D649 Anemia, unspecified: Secondary | ICD-10-CM | POA: Diagnosis not present

## 2020-03-26 DIAGNOSIS — E876 Hypokalemia: Secondary | ICD-10-CM | POA: Diagnosis present

## 2020-03-26 DIAGNOSIS — J9601 Acute respiratory failure with hypoxia: Secondary | ICD-10-CM | POA: Diagnosis present

## 2020-03-26 DIAGNOSIS — I5033 Acute on chronic diastolic (congestive) heart failure: Secondary | ICD-10-CM | POA: Diagnosis present

## 2020-03-26 DIAGNOSIS — Z974 Presence of external hearing-aid: Secondary | ICD-10-CM | POA: Diagnosis not present

## 2020-03-26 DIAGNOSIS — Z882 Allergy status to sulfonamides status: Secondary | ICD-10-CM | POA: Diagnosis not present

## 2020-03-26 DIAGNOSIS — Z961 Presence of intraocular lens: Secondary | ICD-10-CM | POA: Diagnosis present

## 2020-03-26 DIAGNOSIS — Z9841 Cataract extraction status, right eye: Secondary | ICD-10-CM | POA: Diagnosis not present

## 2020-03-26 DIAGNOSIS — Z79899 Other long term (current) drug therapy: Secondary | ICD-10-CM | POA: Diagnosis not present

## 2020-03-26 LAB — BPAM RBC
Blood Product Expiration Date: 202203072359
ISSUE DATE / TIME: 202202251816
Unit Type and Rh: 600

## 2020-03-26 LAB — TYPE AND SCREEN
ABO/RH(D): A POS
Antibody Screen: NEGATIVE
Unit division: 0

## 2020-03-26 LAB — BASIC METABOLIC PANEL
Anion gap: 8 (ref 5–15)
BUN: 12 mg/dL (ref 8–23)
CO2: 27 mmol/L (ref 22–32)
Calcium: 8.4 mg/dL — ABNORMAL LOW (ref 8.9–10.3)
Chloride: 109 mmol/L (ref 98–111)
Creatinine, Ser: 0.74 mg/dL (ref 0.44–1.00)
GFR, Estimated: >60 mL/min (ref >60)
Glucose, Bld: 92 mg/dL (ref 70–99)
Potassium: 3.8 mmol/L (ref 3.5–5.1)
Sodium: 144 mmol/L (ref 135–145)

## 2020-03-26 LAB — CBC
HCT: 25 % — ABNORMAL LOW (ref 36.0–46.0)
HCT: 25.1 % — ABNORMAL LOW (ref 36.0–46.0)
HCT: 31.1 % — ABNORMAL LOW (ref 36.0–46.0)
Hemoglobin: 7.8 g/dL — ABNORMAL LOW (ref 12.0–15.0)
Hemoglobin: 7.7 g/dL — ABNORMAL LOW (ref 12.0–15.0)
Hemoglobin: 9.2 g/dL — ABNORMAL LOW (ref 12.0–15.0)
MCH: 24.5 pg — ABNORMAL LOW (ref 26.0–34.0)
MCH: 24.4 pg — ABNORMAL LOW (ref 26.0–34.0)
MCH: 24.1 pg — ABNORMAL LOW (ref 26.0–34.0)
MCHC: 31.2 g/dL (ref 30.0–36.0)
MCHC: 30.7 g/dL (ref 30.0–36.0)
MCHC: 29.6 g/dL — ABNORMAL LOW (ref 30.0–36.0)
MCV: 78.6 fL — ABNORMAL LOW (ref 80.0–100.0)
MCV: 79.7 fL — ABNORMAL LOW (ref 80.0–100.0)
MCV: 81.6 fL (ref 80.0–100.0)
Platelets: 264 10*3/uL (ref 150–400)
Platelets: 288 10*3/uL (ref 150–400)
Platelets: 366 10*3/uL (ref 150–400)
RBC: 3.18 MIL/uL — ABNORMAL LOW (ref 3.87–5.11)
RBC: 3.15 MIL/uL — ABNORMAL LOW (ref 3.87–5.11)
RBC: 3.81 MIL/uL — ABNORMAL LOW (ref 3.87–5.11)
RDW: 20.9 % — ABNORMAL HIGH (ref 11.5–15.5)
RDW: 21.4 % — ABNORMAL HIGH (ref 11.5–15.5)
RDW: 21.4 % — ABNORMAL HIGH (ref 11.5–15.5)
WBC: 9.7 10*3/uL (ref 4.0–10.5)
WBC: 11.1 10*3/uL — ABNORMAL HIGH (ref 4.0–10.5)
WBC: 16.4 10*3/uL — ABNORMAL HIGH (ref 4.0–10.5)
nRBC: 0 % (ref 0.0–0.2)
nRBC: 0 % (ref 0.0–0.2)
nRBC: 0.2 % (ref 0.0–0.2)

## 2020-03-26 MED ORDER — HYDROXYZINE HCL 25 MG PO TABS
25.0000 mg | ORAL_TABLET | Freq: Three times a day (TID) | ORAL | Status: DC | PRN
  Administered 2020-03-26: 12:00:00 25 mg via ORAL
  Filled 2020-03-26 (×2): qty 1

## 2020-03-26 MED ORDER — POLYETHYLENE GLYCOL 3350 17 G PO PACK
17.0000 g | PACK | Freq: Every day | ORAL | Status: DC
  Administered 2020-03-26 – 2020-03-27 (×2): 17 g via ORAL
  Filled 2020-03-26: qty 1

## 2020-03-26 MED ORDER — VITAMIN D 25 MCG (1000 UNIT) PO TABS
1000.0000 [IU] | ORAL_TABLET | Freq: Every day | ORAL | Status: DC
  Administered 2020-03-26 – 2020-04-02 (×8): 1000 [IU] via ORAL
  Filled 2020-03-26 (×7): qty 1

## 2020-03-26 MED ORDER — SODIUM CHLORIDE 0.9 % IV SOLN
200.0000 mg | INTRAVENOUS | Status: AC
  Administered 2020-03-26 – 2020-03-28 (×3): 200 mg via INTRAVENOUS
  Filled 2020-03-26 (×3): qty 10

## 2020-03-26 MED ORDER — HALOPERIDOL LACTATE 5 MG/ML IJ SOLN
1.0000 mg | Freq: Once | INTRAMUSCULAR | Status: AC
  Administered 2020-03-26: 1 mg via INTRAVENOUS
  Filled 2020-03-26: qty 1

## 2020-03-26 MED ORDER — FUROSEMIDE 10 MG/ML IJ SOLN
40.0000 mg | Freq: Once | INTRAMUSCULAR | Status: AC
  Administered 2020-03-26: 40 mg via INTRAVENOUS
  Filled 2020-03-26: qty 4

## 2020-03-26 NOTE — Progress Notes (Signed)
PROGRESS NOTE    MELISHA EGGLETON  XVQ:[id number] DOB: 1929/07/04 DOA: 03/25/2020 PCP: [name], MD    Assessment & Plan:   Principal Problem:   Symptomatic anemia Active Problems:   HTN (hypertension)   Permanent atrial fibrillation (HCC)   Hypokalemia   Iron deficiency anemia   HLD (hyperlipidemia)   Kinslie H Meskill is a 87 y.o. female with medical history significant of atrial fibrillation on Xarelto, hypertension, hyperlipidemia, hemorrhoids, hearing loss, diverticulosis, iron deficiency anemia, Bell's palsy, anxiety, who presents with fatigue and abnormal labs of hemoglobin 6.7 and potassium 2.7.   Symptomatic anemia  FOBT positive.  Hgb 10.3 -->6.5. Dr. Marius Ditch of GI is consulted.  Since patient is on Xarelto, pt needs to wait 2 days. --s/p 1u pRBC --started on IV PPI BID  --started on IVF Plan: --Monitor Hgb, transfuse to keep Hgb >7 --cont IV PPI BID for now --d/c MIVF --GI procedure when medically cleared (currently not due to new acute hypoxia and volume overload).  Hx of iron def anemia --IV iron 200 mg daily x 3 days --cont oral iron supplement  Acute hypoxic respiratory failure  --likely 2/2 volume overload from blood transfusion and IVF --New 4L O2 requirement Plan: --CXR, showed finding suggestive of CHF --IV lasix 40 mg x1 --TTE  HTN (hypertension) --BP intermittently soft --Hold home verapamil   Permanent atrial fibrillation (HCC) -Hold Xarelto -Continue amiodarone -Patient is not sure if she is taking digoxin currently, will hold  Hypokalemia:  --monitor and replete with oral potassium  HLD (hyperlipidemia) -Pravastatin   DVT prophylaxis: SCD/Compression stockings Code Status: Limited code  Family Communication:  Level of care: Med-Surg Dispo:   The patient is from: home Anticipated d/c is to: home Anticipated d/c date is: 2-3 days Patient currently is not medically ready to d/c due to: need GI scope, not  medically cleared currently   Subjective and Interval History:  Pt developed significant dyspnea and hypoxia overnight.  Pt denied feeling anxious, but just that she couldn't breath.  Had BM yesterday.     Objective: Vitals:   03/26/20 0402 03/26/20 0700 03/26/20 0813 03/26/20 1649  BP: (!) 113/92   (!) 115/51  Pulse: 88  73 86  Resp: 16  16 18   Temp: 98.3 F (36.8 C) 97.7 F (36.5 C) 97.7 F (36.5 C) 97.9 F (36.6 C)  TempSrc: Axillary  Oral Oral  SpO2: 99% 99% 99% 95%  Weight:      Height:        Intake/Output Summary (Last 24 hours) at 03/26/2020 1920 Last data filed at 03/26/2020 1919 Gross per 24 hour  Intake 1859 ml  Output 2700 ml  Net -841 ml   Filed Weights   03/25/20 1108 03/25/20 1539  Weight: 54.4 kg 54.2 kg    Examination:   Constitutional: NAD, AAOx3, sitting up HEENT: conjunctivae and lids normal, EOMI CV: tachycardic, No cyanosis.   RESP: increased RR, clear, on 4L  Extremities: No effusions, edema in BLE SKIN: warm, dry Neuro: II - XII grossly intact.     Data Reviewed: I have personally reviewed following labs and imaging studies  CBC: Recent Labs  Lab 03/25/20 1133 03/26/20 0215 03/26/20 0710 03/26/20 1316  WBC 10.2 9.7 11.1* 16.4*  NEUTROABS 6.8  --   --   --   HGB 6.5* 7.8* 7.7* 9.2*  HCT 22.0* 25.0* 25.1* 31.1*  MCV 77.5* 78.6* 79.7* 81.6  PLT 326 264 288 093   Basic Metabolic Panel: Recent Labs  Lab 03/25/20 1133 03/26/20 0215  NA 141 144  K 2.3* 3.8  CL 101 109  CO2 29 27  GLUCOSE 147* 92  BUN 14 12  CREATININE 0.87 0.74  CALCIUM 8.7* 8.4*  MG 2.0  --    GFR: Estimated Creatinine Clearance: 39.4 mL/min (by C-G formula based on SCr of 0.74 mg/dL). Liver Function Tests: Recent Labs  Lab 03/25/20 1133  AST 24  ALT 16  ALKPHOS 67  BILITOT 0.7  PROT 6.8  ALBUMIN 3.3*   No results for input(s): LIPASE, AMYLASE in the last 168 hours. No results for input(s): AMMONIA in the last 168 hours. Coagulation  Profile: Recent Labs  Lab 03/25/20 1133  INR 2.2*   Cardiac Enzymes: No results for input(s): CKTOTAL, CKMB, CKMBINDEX, TROPONINI in the last 168 hours. BNP (last 3 results) No results for input(s): PROBNP in the last 8760 hours. HbA1C: No results for input(s): HGBA1C in the last 72 hours. CBG: No results for input(s): GLUCAP in the last 168 hours. Lipid Profile: No results for input(s): CHOL, HDL, LDLCALC, TRIG, CHOLHDL, LDLDIRECT in the last 72 hours. Thyroid Function Tests: No results for input(s): TSH, T4TOTAL, FREET4, T3FREE, THYROIDAB in the last 72 hours. Anemia Panel: Recent Labs    03/25/20 1133  FOLATE 41.0  FERRITIN 17  TIBC 403  IRON 22*  RETICCTPCT 2.1   Sepsis Labs: No results for input(s): PROCALCITON, LATICACIDVEN in the last 168 hours.  Recent Results (from the past 240 hour(s))  Resp Panel by RT-PCR (Flu A&B, Covid) Nasopharyngeal Swab     Status: None   Collection Time: 03/25/20 11:33 AM   Specimen: Nasopharyngeal Swab; Nasopharyngeal(NP) swabs in vial transport medium  Result Value Ref Range Status   SARS Coronavirus 2 by RT PCR NEGATIVE NEGATIVE Final    Comment: (NOTE) SARS-CoV-2 target nucleic acids are NOT DETECTED.  The SARS-CoV-2 RNA is generally detectable in upper respiratory specimens during the acute phase of infection. The lowest concentration of SARS-CoV-2 viral copies this assay can detect is 138 copies/mL. A negative result does not preclude SARS-Cov-2 infection and should not be used as the sole basis for treatment or other patient management decisions. A negative result may occur with  improper specimen collection/handling, submission of specimen other than nasopharyngeal swab, presence of viral mutation(s) within the areas targeted by this assay, and inadequate number of viral copies(<138 copies/mL). A negative result must be combined with clinical observations, patient history, and epidemiological information. The expected  result is Negative.  Fact Sheet for Patients:  EntrepreneurPulse.com.au  Fact Sheet for Healthcare Providers:  IncredibleEmployment.be  This test is no t yet approved or cleared by the [place] FDA and  has been authorized for detection and/or diagnosis of SARS-CoV-2 by FDA under an Emergency Use Authorization (EUA). This EUA will remain  in effect (meaning this test can be used) for the duration of the COVID-19 declaration under Section 564(b)(1) of the Act, 21 U.S.C.section 360bbb-3(b)(1), unless the authorization is terminated  or revoked sooner.       Influenza A by PCR NEGATIVE NEGATIVE Final   Influenza B by PCR NEGATIVE NEGATIVE Final    Comment: (NOTE) The Xpert Xpress SARS-CoV-2/FLU/RSV plus assay is intended as an aid in the diagnosis of influenza from Nasopharyngeal swab specimens and should not be used as a sole basis for treatment. Nasal washings and aspirates are unacceptable for Xpert Xpress SARS-CoV-2/FLU/RSV testing.  Fact Sheet for Patients: EntrepreneurPulse.com.au  Fact Sheet for Healthcare Providers: IncredibleEmployment.be  This  test is not yet approved or cleared by the [place] and has been authorized for detection and/or diagnosis of SARS-CoV-2 by FDA under an Emergency Use Authorization (EUA). This EUA will remain in effect (meaning this test can be used) for the duration of the COVID-19 declaration under Section 564(b)(1) of the Act, 21 U.S.C. section 360bbb-3(b)(1), unless the authorization is terminated or revoked.  Performed at [facility], [address], [place], [place] [address]       Radiology Studies: [facility] Chest [place] 1 View  Result Date: 03/26/2020 CLINICAL DATA:  87 year old female with history of respiratory failure and hypoxia. EXAM: PORTABLE CHEST 1 VIEW COMPARISON:  Chest x-ray 01/15/2020. FINDINGS: There is cephalization of the  pulmonary vasculature, indistinctness of the interstitial markings, and patchy airspace disease throughout the lungs bilaterally suggestive of moderate pulmonary edema. Moderate to large bilateral pleural effusions. Cardiopericardial silhouette is partially obscured but appears enlarged. Upper mediastinal contours are within normal limits. Aortic atherosclerosis. IMPRESSION: 1. The appearance the chest suggests congestive heart failure, as above. 2. Moderate enlargement of the cardiopericardial silhouette, which appears significantly worsened compared to the prior chest x-ray from January 15, 2020. This could reflect underlying cardiomegaly, however, clinical correlation for signs and symptoms of pericardial effusion is suggested. 3. Aortic atherosclerosis. Electronically Signed   By: Vinnie Langton M.D.   On: 03/26/2020 12:57   CT Angio Abd/Pel W and/or Wo Contrast  Result Date: 03/25/2020 CLINICAL DATA:  Fatigue.  Anemia.  Evaluate for GI bleeding. EXAM: CTA ABDOMEN AND PELVIS WITHOUT AND WITH CONTRAST TECHNIQUE: Multidetector CT imaging of the abdomen and pelvis was performed using the standard protocol during bolus administration of intravenous contrast. Multiplanar reconstructed images and MIPs were obtained and reviewed to evaluate the vascular anatomy. CONTRAST:  76mL OMNIPAQUE IOHEXOL 350 MG/ML SOLN COMPARISON:  CT abdomen pelvis-08/14/2015 FINDINGS: VASCULAR Aorta: Calcified atherosclerotic plaque within normal caliber abdominal aorta. No evidence of abdominal aortic dissection or periaortic stranding this nongated examination. Celiac: There is a minimal amount of calcified atherosclerotic plaque involving the origin the celiac artery, not resulting in a hemodynamically significant narrowing. SMA: Widely patent without hemodynamically significant narrowing. Conventional branching pattern. The distal tributaries the SMA appear widely patent without discrete lumen filling defect to suggest distal  embolism. Renals: There is a tiny accessory left renal artery supplies the inferior pole of the left kidney. Solitary right-sided renal artery. There is a minimal amount of calcified atherosclerotic plaque involving the origin of the bilateral dominant renal arteries, not resulting in a hemodynamically significant stenosis. No vessel irregularity to suggest FMD. IMA: Remains patent. Inflow: Eccentric calcified atherosclerotic plaque involving the proximal aspect of the right common iliac artery approaches 50% luminal narrowing (axial image 131, series 5; coronal image 35, series 9). The left common iliac artery is mildly disease though patent and of normal caliber. The bilateral internal iliac arteries are diseased though patent and of normal caliber. The bilateral external iliac arteries are tortuous though of normal caliber and widely patent without a hemodynamically significant narrowing. Proximal Outflow: There is a minimal amount of eccentric calcified atherosclerotic plaque involving the bilateral common femoral arteries, not resulting in hemodynamically significant stenosis. The imaged portions of the bilateral deep and superficial femoral arteries are widely patent without a hemodynamically significant narrowing. Veins: The IVC and pelvic venous systems appear widely patent. Review of the MIP images confirms the above findings. _________________________________________________________ NON-VASCULAR Lower chest: Limited visualization of lower thorax demonstrates small to moderate-sized bilateral pleural effusions with associated  subsegmental consolidative opacities and air bronchograms and associated partial atelectasis of the bilateral lower lobes. Cardiomegaly. There is diffuse decreased attenuation of the intra cardiac blood pool suggestive of anemia. Coronary artery calcifications. No pericardial effusion. Hepatobiliary: Normal hepatic contour. Redemonstrated multiple hypoattenuating nonenhancing hepatic  cysts including dominant approximately 2.1 cm cyst within the dome of the left lobe of the liver. Note is made of a punctate granuloma within the anterior segment of the right lobe of the liver (image 24, series 2), unchanged. No discrete worrisome hyperenhancing hepatic lesions. Normal appearance of the gallbladder given degree distention. No discrete radiopaque gallstones. No intra or extrahepatic biliary ductal dilatation. No ascites. Pancreas: Normal appearance of the pancreas. Spleen: Normal appearance of the spleen. Adrenals/Urinary Tract: There is symmetric enhancement of the bilateral kidneys. No evidence of nephrolithiasis. There is a minimal amount of grossly symmetric bilateral likely age and body habitus related perinephric stranding. No urine obstruction. No discrete renal lesions. There is mild thickening of the medial limb and crux of the left adrenal gland without discrete nodule, similar to the 2017 examination. Normal appearance of the right adrenal gland. Normal appearance of the urinary bladder. Stomach/Bowel: No discrete areas of intraluminal contrast extravasation to suggest the etiology of clinical suspicion of GI bleeding. Moderate to large colonic stool burden without evidence of enteric obstruction. Scattered radiopaque pill fragments are seen throughout the small intestine. Colonic diverticulosis without evidence of superimposed acute diverticulitis. Small hiatal hernia. There is mild diffuse thickening the stomach wall, potentially accentuated due to underdistention. Otherwise, there are no discrete areas of bowel wall thickening. Normal appearance of the terminal ileum. The appendix is not visualized, however there is no pericecal inflammatory change. No pneumoperitoneum, pneumatosis or portal venous gas. Lymphatic: No bulky retroperitoneal, mesenteric, pelvic or inguinal lymphadenopathy. Reproductive: Suspected degenerating fibroid within the left side of the uterine fundus. Otherwise,  normal appearance of the pelvic organs for age. No free fluid the pelvic cul-de-sac. Other: Minimal amount of subcutaneous edema about the midline of the low back. Musculoskeletal: No acute or aggressive osseous abnormalities. Post intramedullary fixation of the right femur with dynamic screw fixation of the right femoral neck. Mild-to-moderate multilevel lumbar spine DDD worse at L4-L5 and L5-S1. IMPRESSION: Vascular Impression: 1. No discrete areas of intraluminal contrast extravasation to suggest the source of clinical suspicion of GI bleeding. 2. Scattered atherosclerotic plaque within normal caliber abdominal aorta. Aortic Atherosclerosis (ICD10-I70.0). 3. Cardiomegaly.  Coronary artery calcifications. 4. Suspected hemodynamically significant narrowing involving the right common iliac artery. Correlation for symptoms of right lower extremity PAD is advised. Nonvascular Impression: 1. Diffuse thickening of the stomach wall, nonspecific and while potentially accentuated due to underdistention, could be seen in the setting of a gastritis. Further evaluation with upper endoscopy could be performed as indicated. 2. Small to moderate-sized bilateral effusions with associated partial atelectasis of the bilateral lower lobes. 3. Large colonic stool burden without evidence of enteric obstruction. 4. Colonic diverticulosis without evidence of superimposed acute diverticulitis. 5. Small hiatal hernia. Electronically Signed   By: Sandi Mariscal M.D.   On: 03/25/2020 15:32     Scheduled Meds: . amiodarone  200 mg Oral Daily  . B-complex with vitamin C  1 tablet Oral Daily  . cholecalciferol  1,000 Units Oral Daily  . iron polysaccharides  150 mg Oral Daily  . melatonin  5 mg Oral QHS  . pantoprazole (PROTONIX) IV  40 mg Intravenous Q12H  . polyethylene glycol  17 g Oral Daily  . pravastatin  40 mg Oral q1800  . verapamil  180 mg Oral Daily   Continuous Infusions: . iron sucrose Stopped (03/26/20 1325)      LOS: 0 days     Enzo Bi, MD [facility] If 7PM-7AM, please contact night-coverage 03/26/2020, 7:20 PM

## 2020-03-26 NOTE — Progress Notes (Signed)
This RN attempted twice to administer morning medications. Pt would only wake to voice and immediately go back to sleep. Pt expressed tiredness. This RN will attempt to administer medication when patient is more alert.

## 2020-03-26 NOTE — Plan of Care (Signed)
  Problem: Education: Goal: Knowledge of General Education information will improve Description: Including pain rating scale, medication(s)/side effects and non-pharmacologic comfort measures 03/26/2020 1400 by Cristela Blue, RN Outcome: Progressing 03/26/2020 1400 by Cristela Blue, RN Outcome: Progressing   Problem: Health Behavior/Discharge Planning: Goal: Ability to manage health-related needs will improve 03/26/2020 1400 by Cristela Blue, RN Outcome: Progressing 03/26/2020 1400 by Cristela Blue, RN Outcome: Progressing   Problem: Clinical Measurements: Goal: Ability to maintain clinical measurements within normal limits will improve 03/26/2020 1400 by Cristela Blue, RN Outcome: Progressing 03/26/2020 1400 by Cristela Blue, RN Outcome: Progressing Goal: Will remain free from infection 03/26/2020 1400 by Cristela Blue, RN Outcome: Progressing 03/26/2020 1400 by Cristela Blue, RN Outcome: Progressing Goal: Diagnostic test results will improve 03/26/2020 1400 by Cristela Blue, RN Outcome: Progressing 03/26/2020 1400 by Cristela Blue, RN Outcome: Progressing Goal: Respiratory complications will improve 03/26/2020 1400 by Cristela Blue, RN Outcome: Progressing 03/26/2020 1400 by Cristela Blue, RN Outcome: Progressing Goal: Cardiovascular complication will be avoided 03/26/2020 1400 by Cristela Blue, RN Outcome: Progressing 03/26/2020 1400 by Cristela Blue, RN Outcome: Progressing   Problem: Activity: Goal: Risk for activity intolerance will decrease 03/26/2020 1400 by Cristela Blue, RN Outcome: Progressing 03/26/2020 1400 by Cristela Blue, RN Outcome: Progressing   Problem: Nutrition: Goal: Adequate nutrition will be maintained 03/26/2020 1400 by Cristela Blue, RN Outcome: Progressing 03/26/2020 1400 by Cristela Blue, RN Outcome: Progressing   Problem: Coping: Goal: Level of anxiety will decrease 03/26/2020 1400 by Cristela Blue, RN Outcome:  Progressing 03/26/2020 1400 by Cristela Blue, RN Outcome: Progressing   Problem: Elimination: Goal: Will not experience complications related to bowel motility 03/26/2020 1400 by Cristela Blue, RN Outcome: Progressing 03/26/2020 1400 by Cristela Blue, RN Outcome: Progressing Goal: Will not experience complications related to urinary retention 03/26/2020 1400 by Cristela Blue, RN Outcome: Progressing 03/26/2020 1400 by Cristela Blue, RN Outcome: Progressing   Problem: Pain Managment: Goal: General experience of comfort will improve 03/26/2020 1400 by Cristela Blue, RN Outcome: Progressing 03/26/2020 1400 by Cristela Blue, RN Outcome: Progressing   Problem: Safety: Goal: Ability to remain free from injury will improve 03/26/2020 1400 by Cristela Blue, RN Outcome: Progressing 03/26/2020 1400 by Cristela Blue, RN Outcome: Progressing   Problem: Skin Integrity: Goal: Risk for impaired skin integrity will decrease 03/26/2020 1400 by Cristela Blue, RN Outcome: Progressing 03/26/2020 1400 by Cristela Blue, RN Outcome: Progressing

## 2020-03-26 NOTE — Progress Notes (Signed)
[name] Darby, MD [address]  [place]  [place], [place] [address]  Main: [phone]  Fax: [phone] Pager: [phone]   Subjective: Patient complained that she cannot breathe, felt short winded. Patient's nurse and nurse tech are in the room after I went to see her. Oxygen saturation was 91%, heart rate 109. She appeared very anxious. Patient received blood transfusion overnight, and her hemoglobin responded appropriately  Objective: Vital signs in last 24 hours: Vitals:   03/25/20 2200 03/26/20 0402 03/26/20 0700 03/26/20 0813  BP: (!) 145/61 (!) 113/92    Pulse: 85 88  73  Resp: 19 16  16   Temp: 97.9 F (36.6 C) 98.3 F (36.8 C) 97.7 F (36.5 C) 97.7 F (36.5 C)  TempSrc: Oral Axillary  Oral  SpO2: 96% 99% 99% 99%  Weight:      Height:       Weight change:   Intake/Output Summary (Last 24 hours) at 03/26/2020 1227 Last data filed at 03/26/2020 0556 Gross per 24 hour  Intake 1669 ml  Output 600 ml  Net 1069 ml     Exam: Heart:: Tachycardia, irregular rhythm Lungs: clear to auscultation Abdomen: soft, nontender, normal bowel sounds   Lab Results: CBC Latest Ref Rng & Units 03/26/2020 03/26/2020 03/25/2020  WBC 4.0 - 10.5 K/uL 11.1(H) 9.7 10.2  Hemoglobin 12.0 - 15.0 g/dL 7.7(L) 7.8(L) 6.5(L)  Hematocrit 36.0 - 46.0 % 25.1(L) 25.0(L) 22.0(L)  Platelets 150 - 400 K/uL 288 264 326   CMP Latest Ref Rng & Units 03/26/2020 03/25/2020 01/16/2020  Glucose 70 - 99 mg/dL 92 147(H) 97  BUN 8 - 23 mg/dL 12 14 28(H)  Creatinine 0.44 - 1.00 mg/dL 0.74 0.87 0.77  Sodium 135 - 145 mmol/L 144 141 140  Potassium 3.5 - 5.1 mmol/L 3.8 2.3(LL) 4.3  Chloride 98 - 111 mmol/L 109 101 103  CO2 22 - 32 mmol/L 27 29 28   Calcium 8.9 - 10.3 mg/dL 8.4(L) 8.7(L) 8.9  Total Protein 6.5 - 8.1 g/dL - 6.8 -  Total Bilirubin 0.3 - 1.2 mg/dL - 0.7 -  Alkaline Phos 38 - 126 U/L - 67 -  AST 15 - 41 U/L - 24 -  ALT 0 - 44 U/L - 16 -    Micro Results: Recent Results (from  the past 240 hour(s))  Resp Panel by RT-PCR (Flu A&B, Covid) Nasopharyngeal Swab     Status: None   Collection Time: 03/25/20 11:33 AM   Specimen: Nasopharyngeal Swab; Nasopharyngeal(NP) swabs in vial transport medium  Result Value Ref Range Status   SARS Coronavirus 2 by RT PCR NEGATIVE NEGATIVE Final    Comment: (NOTE) SARS-CoV-2 target nucleic acids are NOT DETECTED.  The SARS-CoV-2 RNA is generally detectable in upper respiratory specimens during the acute phase of infection. The lowest concentration of SARS-CoV-2 viral copies this assay can detect is 138 copies/mL. A negative result does not preclude SARS-Cov-2 infection and should not be used as the sole basis for treatment or other patient management decisions. A negative result may occur with  improper specimen collection/handling, submission of specimen other than nasopharyngeal swab, presence of viral mutation(s) within the areas targeted by this assay, and inadequate number of viral copies(<138 copies/mL). A negative result must be combined with clinical observations, patient history, and epidemiological information. The expected result is Negative.  Fact Sheet for Patients:  EntrepreneurPulse.com.au  Fact Sheet for Healthcare Providers:  IncredibleEmployment.be  This test is no t yet approved or cleared by  the [employer] and  has been authorized for detection and/or diagnosis of SARS-CoV-2 by FDA under an Emergency Use Authorization (EUA). This EUA will remain  in effect (meaning this test can be used) for the duration of the COVID-19 declaration under Section 564(b)(1) of the Act, 21 U.S.C.section 360bbb-3(b)(1), unless the authorization is terminated  or revoked sooner.       Influenza A by PCR NEGATIVE NEGATIVE Final   Influenza B by PCR NEGATIVE NEGATIVE Final    Comment: (NOTE) The Xpert Xpress SARS-CoV-2/FLU/RSV plus assay is intended as an aid in the diagnosis of  influenza from Nasopharyngeal swab specimens and should not be used as a sole basis for treatment. Nasal washings and aspirates are unacceptable for Xpert Xpress SARS-CoV-2/FLU/RSV testing.  Fact Sheet for Patients: EntrepreneurPulse.com.au  Fact Sheet for Healthcare Providers: IncredibleEmployment.be  This test is not yet approved or cleared by the [place] FDA and has been authorized for detection and/or diagnosis of SARS-CoV-2 by FDA under an Emergency Use Authorization (EUA). This EUA will remain in effect (meaning this test can be used) for the duration of the COVID-19 declaration under Section 564(b)(1) of the Act, 21 U.S.C. section 360bbb-3(b)(1), unless the authorization is terminated or revoked.  Performed at [facility], [address], [place], [place] [address]    Studies/Results: CT Angio Abd/Pel W and/or Wo Contrast  Result Date: 03/25/2020 CLINICAL DATA:  Fatigue.  Anemia.  Evaluate for GI bleeding. EXAM: CTA ABDOMEN AND PELVIS WITHOUT AND WITH CONTRAST TECHNIQUE: Multidetector CT imaging of the abdomen and pelvis was performed using the standard protocol during bolus administration of intravenous contrast. Multiplanar reconstructed images and MIPs were obtained and reviewed to evaluate the vascular anatomy. CONTRAST:  35mL OMNIPAQUE IOHEXOL 350 MG/ML SOLN COMPARISON:  CT abdomen pelvis-08/14/2015 FINDINGS: VASCULAR Aorta: Calcified atherosclerotic plaque within normal caliber abdominal aorta. No evidence of abdominal aortic dissection or periaortic stranding this nongated examination. Celiac: There is a minimal amount of calcified atherosclerotic plaque involving the origin the celiac artery, not resulting in a hemodynamically significant narrowing. SMA: Widely patent without hemodynamically significant narrowing. Conventional branching pattern. The distal tributaries the SMA appear widely patent without discrete lumen  filling defect to suggest distal embolism. Renals: There is a tiny accessory left renal artery supplies the inferior pole of the left kidney. Solitary right-sided renal artery. There is a minimal amount of calcified atherosclerotic plaque involving the origin of the bilateral dominant renal arteries, not resulting in a hemodynamically significant stenosis. No vessel irregularity to suggest FMD. IMA: Remains patent. Inflow: Eccentric calcified atherosclerotic plaque involving the proximal aspect of the right common iliac artery approaches 50% luminal narrowing (axial image 131, series 5; coronal image 35, series 9). The left common iliac artery is mildly disease though patent and of normal caliber. The bilateral internal iliac arteries are diseased though patent and of normal caliber. The bilateral external iliac arteries are tortuous though of normal caliber and widely patent without a hemodynamically significant narrowing. Proximal Outflow: There is a minimal amount of eccentric calcified atherosclerotic plaque involving the bilateral common femoral arteries, not resulting in hemodynamically significant stenosis. The imaged portions of the bilateral deep and superficial femoral arteries are widely patent without a hemodynamically significant narrowing. Veins: The IVC and pelvic venous systems appear widely patent. Review of the MIP images confirms the above findings. _________________________________________________________ NON-VASCULAR Lower chest: Limited visualization of lower thorax demonstrates small to moderate-sized bilateral pleural effusions with associated subsegmental consolidative opacities and air bronchograms and associated  partial atelectasis of the bilateral lower lobes. Cardiomegaly. There is diffuse decreased attenuation of the intra cardiac blood pool suggestive of anemia. Coronary artery calcifications. No pericardial effusion. Hepatobiliary: Normal hepatic contour. Redemonstrated multiple  hypoattenuating nonenhancing hepatic cysts including dominant approximately 2.1 cm cyst within the dome of the left lobe of the liver. Note is made of a punctate granuloma within the anterior segment of the right lobe of the liver (image 24, series 2), unchanged. No discrete worrisome hyperenhancing hepatic lesions. Normal appearance of the gallbladder given degree distention. No discrete radiopaque gallstones. No intra or extrahepatic biliary ductal dilatation. No ascites. Pancreas: Normal appearance of the pancreas. Spleen: Normal appearance of the spleen. Adrenals/Urinary Tract: There is symmetric enhancement of the bilateral kidneys. No evidence of nephrolithiasis. There is a minimal amount of grossly symmetric bilateral likely age and body habitus related perinephric stranding. No urine obstruction. No discrete renal lesions. There is mild thickening of the medial limb and crux of the left adrenal gland without discrete nodule, similar to the 2017 examination. Normal appearance of the right adrenal gland. Normal appearance of the urinary bladder. Stomach/Bowel: No discrete areas of intraluminal contrast extravasation to suggest the etiology of clinical suspicion of GI bleeding. Moderate to large colonic stool burden without evidence of enteric obstruction. Scattered radiopaque pill fragments are seen throughout the small intestine. Colonic diverticulosis without evidence of superimposed acute diverticulitis. Small hiatal hernia. There is mild diffuse thickening the stomach wall, potentially accentuated due to underdistention. Otherwise, there are no discrete areas of bowel wall thickening. Normal appearance of the terminal ileum. The appendix is not visualized, however there is no pericecal inflammatory change. No pneumoperitoneum, pneumatosis or portal venous gas. Lymphatic: No bulky retroperitoneal, mesenteric, pelvic or inguinal lymphadenopathy. Reproductive: Suspected degenerating fibroid within the left  side of the uterine fundus. Otherwise, normal appearance of the pelvic organs for age. No free fluid the pelvic cul-de-sac. Other: Minimal amount of subcutaneous edema about the midline of the low back. Musculoskeletal: No acute or aggressive osseous abnormalities. Post intramedullary fixation of the right femur with dynamic screw fixation of the right femoral neck. Mild-to-moderate multilevel lumbar spine DDD worse at L4-L5 and L5-S1. IMPRESSION: Vascular Impression: 1. No discrete areas of intraluminal contrast extravasation to suggest the source of clinical suspicion of GI bleeding. 2. Scattered atherosclerotic plaque within normal caliber abdominal aorta. Aortic Atherosclerosis (ICD10-I70.0). 3. Cardiomegaly.  Coronary artery calcifications. 4. Suspected hemodynamically significant narrowing involving the right common iliac artery. Correlation for symptoms of right lower extremity PAD is advised. Nonvascular Impression: 1. Diffuse thickening of the stomach wall, nonspecific and while potentially accentuated due to underdistention, could be seen in the setting of a gastritis. Further evaluation with upper endoscopy could be performed as indicated. 2. Small to moderate-sized bilateral effusions with associated partial atelectasis of the bilateral lower lobes. 3. Large colonic stool burden without evidence of enteric obstruction. 4. Colonic diverticulosis without evidence of superimposed acute diverticulitis. 5. Small hiatal hernia. Electronically Signed   By: Sandi Mariscal M.D.   On: 03/25/2020 15:32   Medications:  I have reviewed the patient's current medications. Prior to Admission:  Medications Prior to Admission  Medication Sig Dispense Refill Last Dose  . acetaminophen (TYLENOL) 500 MG tablet Take by mouth.   Past Month at Unknown time  . amiodarone (PACERONE) 200 MG tablet Take 1 tablet (200 mg total) by mouth daily. 30 tablet 1 03/25/2020 at Unknown time  . calcium carbonate 1250 MG capsule Take 2  capsules by mouth 2 (  two) times daily with a meal.   Past Week at Unknown time  . Cholecalciferol 25 MCG (1000 UT) tablet Take 1 tablet by mouth in the morning and at bedtime.   Past Week at Unknown time  . furosemide (LASIX) 40 MG tablet Take 1 tablet (40 mg total) by mouth daily. 30 tablet 1 03/25/2020 at Unknown time  . lovastatin (MEVACOR) 40 MG tablet Take 40 mg by mouth at bedtime.   03/24/2020 at Unknown time  . lovastatin (MEVACOR) 40 MG tablet Take 1 tablet by mouth at bedtime.   03/24/2020 at Unknown time  . polyethylene glycol powder (GLYCOLAX/MIRALAX) 17 GM/SCOOP powder Take by mouth.   03/24/2020 at Unknown time  . Rivaroxaban (XARELTO) 15 MG TABS tablet Take 1 tablet (15 mg total) by mouth daily with supper. 42 tablet 1 03/24/2020 at Unknown time  . verapamil (CALAN-SR) 180 MG CR tablet Take 180 mg by mouth daily.   03/25/2020 at Unknown time  . ascorbic acid (VITAMIN C) 1000 MG tablet Take 1,000 mg by mouth 2 (two) times daily. (Patient not taking: Reported on 03/25/2020)   Not Taking at Unknown time  . B Complex-C (B-COMPLEX WITH VITAMIN C) tablet Take 1 tablet by mouth daily.     . Cholecalciferol 25 MCG (1000 UT) tablet Take by mouth.     . collagenase (SANTYL) ointment Apply 1 application topically daily. (Patient not taking: Reported on 03/25/2020) 15 g 0 Completed Course at Unknown time  . digoxin 62.5 MCG TABS Take 0.0625 mg by mouth daily. 30 tablet 1   . folic acid (FOLVITE) 017 MCG tablet Take 800 mcg by mouth daily. (Patient not taking: Reported on 03/25/2020)   Not Taking at Unknown time  . Glucosamine HCl (GLUCOSAMINE PO) Take 2 Doses by mouth at bedtime.  (Patient not taking: Reported on 03/25/2020)   Not Taking at Unknown time  . iron polysaccharides (NIFEREX) 150 MG capsule Take 1 capsule (150 mg total) by mouth daily. (Patient not taking: Reported on 03/25/2020) 30 capsule 0 Not Taking at Unknown time  . metoprolol tartrate (LOPRESSOR) 50 MG tablet Take 1 tablet (50 mg total) by  mouth 2 (two) times daily. 60 tablet 1   . metoprolol tartrate (LOPRESSOR) 50 MG tablet Take 1 tablet by mouth 2 (two) times daily. (Patient not taking: Reported on 03/25/2020)   Not Taking at Unknown time  . polymixin-bacitracin (POLYSPORIN) 500-10000 UNIT/GM OINT ointment Apply 1 application topically daily. To left leg wounds (Patient not taking: Reported on 03/25/2020) 28 g 0 Not Taking at Unknown time  . potassium chloride SA (KLOR-CON) 20 MEQ tablet Take 1 tablet (20 mEq total) by mouth daily for 14 days. 14 tablet 0   . selenium 50 MCG TABS tablet Take 50 mcg by mouth daily. (Patient not taking: Reported on 03/25/2020)   Not Taking at Unknown time  . tretinoin (RETIN-A) 0.05 % cream Apply 1 Dose topically at bedtime. (Patient not taking: Reported on 03/25/2020)   Not Taking at Unknown time   Scheduled: . amiodarone  200 mg Oral Daily  . B-complex with vitamin C  1 tablet Oral Daily  . cholecalciferol  1,000 Units Oral Daily  . furosemide  40 mg Oral Daily  . iron polysaccharides  150 mg Oral Daily  . melatonin  5 mg Oral QHS  . pantoprazole (PROTONIX) IV  40 mg Intravenous Q12H  . polyethylene glycol  17 g Oral Daily  . pravastatin  40 mg Oral q1800  .  verapamil  180 mg Oral Daily   Continuous: . iron sucrose 200 mg (03/26/20 1215)   OMV:[id number], hydrALAZINE, hydrOXYzine, ondansetron (ZOFRAN) IV Anti-infectives (From admission, onward)   None     Scheduled Meds: . amiodarone  200 mg Oral Daily  . B-complex with vitamin C  1 tablet Oral Daily  . cholecalciferol  1,000 Units Oral Daily  . furosemide  40 mg Oral Daily  . iron polysaccharides  150 mg Oral Daily  . melatonin  5 mg Oral QHS  . pantoprazole (PROTONIX) IV  40 mg Intravenous Q12H  . polyethylene glycol  17 g Oral Daily  . pravastatin  40 mg Oral q1800  . verapamil  180 mg Oral Daily   Continuous Infusions: . iron sucrose 200 mg (03/26/20 1215)   PRN Meds:.acetaminophen, hydrALAZINE, hydrOXYzine, ondansetron  (ZOFRAN) IV   Assessment: Principal Problem:   Symptomatic anemia Active Problems:   HTN (hypertension)   Permanent atrial fibrillation (HCC)   Hypokalemia   Iron deficiency anemia   HLD (hyperlipidemia)   Plan: TIMBERLYNN KIZZIAH is a 87 y.o. female with history of A. fib on Xarelto, bilateral lower extremity cellulitis, iron deficiency anemia who is admitted with acute on chronic iron deficiency anemia with no evidence of active GI bleed CT revealed gastric wall thickening  Iron deficiency anemia, most likely secondary to insufficient iron replacement as outpatient Ongoing iron deficiency anemia Recommend blood transfusion to keep hemoglobin above 8 Received Venofer x1 Continue to hold Xarelto at least for 48 hours in order to perform upper endoscopy Tentative plan to perform upper endoscopy tomorrow if patient is not short of breath and stable from cardiorespiratory standpoint Continue pantoprazole 40 mg IV twice daily Check H. pylori stool antigen N.p.o. effective 5 AM tomorrow  Thank you for involving me in the care of this patient.     LOS: 0 days   Connor Foxworthy 03/26/2020, 12:27 PM

## 2020-03-27 ENCOUNTER — Inpatient Hospital Stay
Admit: 2020-03-27 | Discharge: 2020-03-27 | Disposition: A | Discharge status: Home / Self Care | Payer: Medicare Other | Attending: Hospitalist | Admitting: Hospitalist

## 2020-03-27 DIAGNOSIS — D649 Anemia, unspecified: Secondary | ICD-10-CM | POA: Diagnosis not present

## 2020-03-27 DIAGNOSIS — D509 Iron deficiency anemia, unspecified: Secondary | ICD-10-CM | POA: Diagnosis not present

## 2020-03-27 LAB — CBC
HCT: 26 % — ABNORMAL LOW (ref 36.0–46.0)
Hemoglobin: 7.8 g/dL — ABNORMAL LOW (ref 12.0–15.0)
MCH: 24 pg — ABNORMAL LOW (ref 26.0–34.0)
MCHC: 30 g/dL (ref 30.0–36.0)
MCV: 80 fL (ref 80.0–100.0)
Platelets: 298 10*3/uL (ref 150–400)
RBC: 3.25 MIL/uL — ABNORMAL LOW (ref 3.87–5.11)
RDW: 21.9 % — ABNORMAL HIGH (ref 11.5–15.5)
WBC: 12.1 10*3/uL — ABNORMAL HIGH (ref 4.0–10.5)
nRBC: 0.4 % — ABNORMAL HIGH (ref 0.0–0.2)

## 2020-03-27 LAB — BASIC METABOLIC PANEL
Anion gap: 9 (ref 5–15)
BUN: 13 mg/dL (ref 8–23)
CO2: 26 mmol/L (ref 22–32)
Calcium: 8.6 mg/dL — ABNORMAL LOW (ref 8.9–10.3)
Chloride: 105 mmol/L (ref 98–111)
Creatinine, Ser: 0.75 mg/dL (ref 0.44–1.00)
GFR, Estimated: >60 mL/min (ref >60)
Glucose, Bld: 83 mg/dL (ref 70–99)
Potassium: 3.2 mmol/L — ABNORMAL LOW (ref 3.5–5.1)
Sodium: 140 mmol/L (ref 135–145)

## 2020-03-27 LAB — ECHOCARDIOGRAM COMPLETE
AR max vel: 1.47 cm2
AV Peak grad: 8.9 mmHg
Ao pk vel: 1.49 m/s
Area-P 1/2: 4.71 cm2
Height: 63 in
P 1/2 time: 423 msec
S' Lateral: 3.18 cm
Weight: 1911.83 oz

## 2020-03-27 LAB — GLUCOSE, CAPILLARY: Glucose-Capillary: 91 mg/dL (ref 70–99)

## 2020-03-27 LAB — MAGNESIUM: Magnesium: 2.2 mg/dL (ref 1.7–2.4)

## 2020-03-27 LAB — TROPONIN I (HIGH SENSITIVITY): Troponin I (High Sensitivity): 21 ng/L — ABNORMAL HIGH (ref <18)

## 2020-03-27 MED ORDER — FUROSEMIDE 10 MG/ML IJ SOLN
40.0000 mg | Freq: Once | INTRAMUSCULAR | Status: AC
  Administered 2020-03-27: 12:00:00 40 mg via INTRAVENOUS
  Filled 2020-03-27: qty 4

## 2020-03-27 MED ORDER — POLYETHYLENE GLYCOL 3350 17 G PO PACK
34.0000 g | PACK | Freq: Two times a day (BID) | ORAL | Status: DC
  Administered 2020-03-27 – 2020-04-02 (×9): 34 g via ORAL
  Filled 2020-03-27 (×10): qty 2

## 2020-03-27 MED ORDER — POTASSIUM CHLORIDE CRYS ER 20 MEQ PO TBCR
40.0000 meq | EXTENDED_RELEASE_TABLET | Freq: Once | ORAL | Status: AC
  Administered 2020-03-27: 40 meq via ORAL
  Filled 2020-03-27: qty 2

## 2020-03-27 NOTE — Progress Notes (Signed)
*  PRELIMINARY RESULTS* Echocardiogram 2D Echocardiogram has been performed.  [name] [name] 03/27/2020, 8:18 AM

## 2020-03-27 NOTE — Progress Notes (Signed)
[name] Darby, MD [address]  [place], [place] [address]  Main: [phone]  Fax: [phone] Pager: [phone]   Subjective: Patient feels significantly better today after adequate diuresis.  Patient son is bedside.  Her hemoglobin is 7.8 which is stable within last 24 hours, since blood transfusion.  She does not have any further episodes of melena.  She is oxygenating well on 2 L [place]  Objective: Vital signs in last 24 hours: Vitals:   03/27/20 0034 03/27/20 0818 03/27/20 1122 03/27/20 1523  BP: 124/66 121/73 (!) 158/74 (!) 133/109  Pulse: 80 79 86 86  Resp: 18 18 18 18   Temp: 98.7 F (37.1 C) 99.2 F (37.3 C) 99.1 F (37.3 C) 98.8 F (37.1 C)  TempSrc: Oral Oral    SpO2: 96% 98% 98% 95%  Weight:      Height:       Weight change:   Intake/Output Summary (Last 24 hours) at 03/27/2020 1743 Last data filed at 03/27/2020 0818 Gross per 24 hour  Intake 480 ml  Output -  Net 480 ml     Exam: Heart:: Tachycardia, irregular rhythm Lungs: clear to auscultation Abdomen: soft, nontender, normal bowel sounds   Lab Results: CBC Latest Ref Rng & Units 03/27/2020 03/26/2020 03/26/2020  WBC 4.0 - 10.5 K/uL 12.1(H) 16.4(H) 11.1(H)  Hemoglobin 12.0 - 15.0 g/dL 7.8(L) 9.2(L) 7.7(L)  Hematocrit 36.0 - 46.0 % 26.0(L) 31.1(L) 25.1(L)  Platelets 150 - 400 K/uL 298 366 288   CMP Latest Ref Rng & Units 03/27/2020 03/26/2020 03/25/2020  Glucose 70 - 99 mg/dL 83 92 147(H)  BUN 8 - 23 mg/dL 13 12 14   Creatinine 0.44 - 1.00 mg/dL 0.75 0.74 0.87  Sodium 135 - 145 mmol/L 140 144 141  Potassium 3.5 - 5.1 mmol/L 3.2(L) 3.8 2.3(LL)  Chloride 98 - 111 mmol/L 105 109 101  CO2 22 - 32 mmol/L 26 27 29   Calcium 8.9 - 10.3 mg/dL 8.6(L) 8.4(L) 8.7(L)  Total Protein 6.5 - 8.1 g/dL - - 6.8  Total Bilirubin 0.3 - 1.2 mg/dL - - 0.7  Alkaline Phos 38 - 126 U/L - - 67  AST 15 - 41 U/L - - 24  ALT 0 - 44 U/L - - 16    Micro Results: Recent Results (from the past 240  hour(s))  Resp Panel by RT-PCR (Flu A&B, Covid) Nasopharyngeal Swab     Status: None   Collection Time: 03/25/20 11:33 AM   Specimen: Nasopharyngeal Swab; Nasopharyngeal(NP) swabs in vial transport medium  Result Value Ref Range Status   SARS Coronavirus 2 by RT PCR NEGATIVE NEGATIVE Final    Comment: (NOTE) SARS-CoV-2 target nucleic acids are NOT DETECTED.  The SARS-CoV-2 RNA is generally detectable in upper respiratory specimens during the acute phase of infection. The lowest concentration of SARS-CoV-2 viral copies this assay can detect is 138 copies/mL. A negative result does not preclude SARS-Cov-2 infection and should not be used as the sole basis for treatment or other patient management decisions. A negative result may occur with  improper specimen collection/handling, submission of specimen other than nasopharyngeal swab, presence of viral mutation(s) within the areas targeted by this assay, and inadequate number of viral copies(<138 copies/mL). A negative result must be combined with clinical observations, patient history, and epidemiological information. The expected result is Negative.  Fact Sheet for Patients:  EntrepreneurPulse.com.au  Fact Sheet for Healthcare Providers:  IncredibleEmployment.be  This test is no t yet approved or cleared  by the [place] and  has been authorized for detection and/or diagnosis of SARS-CoV-2 by FDA under an Emergency Use Authorization (EUA). This EUA will remain  in effect (meaning this test can be used) for the duration of the COVID-19 declaration under Section 564(b)(1) of the Act, 21 U.S.C.section 360bbb-3(b)(1), unless the authorization is terminated  or revoked sooner.       Influenza A by PCR NEGATIVE NEGATIVE Final   Influenza B by PCR NEGATIVE NEGATIVE Final    Comment: (NOTE) The Xpert Xpress SARS-CoV-2/FLU/RSV plus assay is intended as an aid in the diagnosis of influenza from  Nasopharyngeal swab specimens and should not be used as a sole basis for treatment. Nasal washings and aspirates are unacceptable for Xpert Xpress SARS-CoV-2/FLU/RSV testing.  Fact Sheet for Patients: EntrepreneurPulse.com.au  Fact Sheet for Healthcare Providers: IncredibleEmployment.be  This test is not yet approved or cleared by the [place] FDA and has been authorized for detection and/or diagnosis of SARS-CoV-2 by FDA under an Emergency Use Authorization (EUA). This EUA will remain in effect (meaning this test can be used) for the duration of the COVID-19 declaration under Section 564(b)(1) of the Act, 21 U.S.C. section 360bbb-3(b)(1), unless the authorization is terminated or revoked.  Performed at [facility], [address] [place], [place] [address]    Studies/Results: [facility] Chest [place] 1 View  Result Date: 03/26/2020 CLINICAL DATA:  87 year old female with history of respiratory failure and hypoxia. EXAM: PORTABLE CHEST 1 VIEW COMPARISON:  Chest x-ray 01/15/2020. FINDINGS: There is cephalization of the pulmonary vasculature, indistinctness of the interstitial markings, and patchy airspace disease throughout the lungs bilaterally suggestive of moderate pulmonary edema. Moderate to large bilateral pleural effusions. Cardiopericardial silhouette is partially obscured but appears enlarged. Upper mediastinal contours are within normal limits. Aortic atherosclerosis. IMPRESSION: 1. The appearance the chest suggests congestive heart failure, as above. 2. Moderate enlargement of the cardiopericardial silhouette, which appears significantly worsened compared to the prior chest x-ray from January 15, 2020. This could reflect underlying cardiomegaly, however, clinical correlation for signs and symptoms of pericardial effusion is suggested. 3. Aortic atherosclerosis. Electronically Signed   By: Vinnie Langton M.D.   On: 03/26/2020 12:57    ECHOCARDIOGRAM COMPLETE  Result Date: 03/27/2020    ECHOCARDIOGRAM REPORT   Patient Name:   [name] [name] Date of Exam: 03/27/2020 Medical Rec #:  [id number]           Height:       63.0 in Accession #:    [id number]          Weight:       119.5 lb Date of Birth:  [birth year]           BSA:          1.553 m Patient Age:    81 years            BP:           124/66 mmHg Patient Gender: F                   HR:           80 bpm. Exam Location:  [facility] Procedure: 2D Echo Indications:     Acute Respiratory Distress R06.03  History:         Patient has no prior history of Echocardiogram examinations.  Sonographer:     [name] [name] RDCS Referring Phys:  [id number] [place] Diagnosing Phys: [name] Kida MD IMPRESSIONS  1. Left ventricular ejection fraction, by estimation, is 60 to 65%. The left ventricle has normal function. The left ventricle demonstrates regional wall motion abnormalities (see scoring diagram/findings for description). There is mild left ventricular  hypertrophy. Left ventricular diastolic parameters are consistent with Grade II diastolic dysfunction (pseudonormalization).  2. Right ventricular systolic function is normal. The right ventricular size is normal.  3. Moderate pleural effusion in the left lateral region.  4. The mitral valve is myxomatous. Moderate to severe mitral valve regurgitation. Moderate mitral annular calcification.  5. The aortic valve is normal in structure. Aortic valve regurgitation is trivial. FINDINGS  Left Ventricle: Left ventricular ejection fraction, by estimation, is 60 to 65%. The left ventricle has normal function. The left ventricle demonstrates regional wall motion abnormalities. The left ventricular internal cavity size was normal in size. There is mild left ventricular hypertrophy. Left ventricular diastolic parameters are consistent with Grade II diastolic dysfunction (pseudonormalization). Right Ventricle: The right ventricular size is normal. No increase  in right ventricular wall thickness. Right ventricular systolic function is normal. Left Atrium: Left atrial size was normal in size. Right Atrium: Right atrial size was normal in size. Pericardium: There is no evidence of pericardial effusion. Mitral Valve: The mitral valve is myxomatous. There is severe thickening of the anterior mitral valve leaflet(s). There is severe calcification of the anterior mitral valve leaflet(s). Normal mobility of the mitral valve leaflets. Moderate mitral annular  calcification. Moderate to severe mitral valve regurgitation. Tricuspid Valve: The tricuspid valve is normal in structure. Tricuspid valve regurgitation is mild. Aortic Valve: The aortic valve is normal in structure. Aortic valve regurgitation is trivial. Aortic regurgitation PHT measures 423 msec. Aortic valve peak gradient measures 8.9 mmHg. Pulmonic Valve: The pulmonic valve was grossly normal. Pulmonic valve regurgitation is not visualized. Aorta: The ascending aorta was not well visualized. IAS/Shunts: No atrial level shunt detected by color flow Doppler. Additional Comments: There is a moderate pleural effusion in the left lateral region.  LEFT VENTRICLE PLAX 2D LVIDd:         4.75 cm  Diastology LVIDs:         3.18 cm  LV e' medial:   5.44 cm/s LV PW:         0.94 cm  LV E/e' medial: 17.8 LV IVS:        0.94 cm LVOT diam:     1.80 cm LV SV:         48 LV SV Index:   31 LVOT Area:     2.54 cm  RIGHT VENTRICLE RV Basal diam:  2.19 cm RV S prime:     13.50 cm/s TAPSE (M-mode): 2.0 cm LEFT ATRIUM             Index       RIGHT ATRIUM           Index LA diam:        3.10 cm 2.00 cm/m  RA Area:     13.50 cm LA Vol (A2C):   72.1 ml 46.41 ml/m RA Volume:   32.70 ml  21.05 ml/m LA Vol (A4C):   50.8 ml 32.70 ml/m LA Biplane Vol: 61.0 ml 39.27 ml/m  AORTIC VALVE                PULMONIC VALVE AV Area (Vmax): 1.47 cm    PV Vmax:       1.09 m/s AV Vmax:        149.00 cm/s PV Peak  grad:  4.8 mmHg AV Peak Grad:   8.9 mmHg LVOT  Vmax:      86.20 cm/s LVOT Vmean:     50.600 cm/s LVOT VTI:       0.188 m AI PHT:         423 msec  AORTA Ao Root diam: 2.80 cm Ao Asc diam:  2.80 cm MITRAL VALVE                TRICUSPID VALVE MV Area (PHT): 4.71 cm     TV Peak grad:   35.8 mmHg MV Decel Time: 161 msec     TV Vmax:        2.99 m/s MV E velocity: 96.80 cm/s MV A velocity: 106.00 cm/s  SHUNTS MV E/A ratio:  0.91         Systemic VTI:  0.19 m                             Systemic Diam: 1.80 cm [name] Kida MD Electronically signed by [name] Kida MD Signature Date/Time: 03/27/2020/3:06:22 PM    Final    Medications:  I have reviewed the patient's current medications. Prior to Admission:  Medications Prior to Admission  Medication Sig Dispense Refill Last Dose  . acetaminophen (TYLENOL) 500 MG tablet Take by mouth.   Past Month at Unknown time  . amiodarone (PACERONE) 200 MG tablet Take 1 tablet (200 mg total) by mouth daily. 30 tablet 1 03/25/2020 at Unknown time  . calcium carbonate 1250 MG capsule Take 2 capsules by mouth 2 (two) times daily with a meal.   Past Week at Unknown time  . Cholecalciferol 25 MCG (1000 UT) tablet Take 1 tablet by mouth in the morning and at bedtime.   Past Week at Unknown time  . furosemide (LASIX) 40 MG tablet Take 1 tablet (40 mg total) by mouth daily. 30 tablet 1 03/25/2020 at Unknown time  . lovastatin (MEVACOR) 40 MG tablet Take 40 mg by mouth at bedtime.   03/24/2020 at Unknown time  . lovastatin (MEVACOR) 40 MG tablet Take 1 tablet by mouth at bedtime.   03/24/2020 at Unknown time  . polyethylene glycol powder (GLYCOLAX/MIRALAX) 17 GM/SCOOP powder Take by mouth.   03/24/2020 at Unknown time  . Rivaroxaban (XARELTO) 15 MG TABS tablet Take 1 tablet (15 mg total) by mouth daily with supper. 42 tablet 1 03/24/2020 at Unknown time  . verapamil (CALAN-SR) 180 MG CR tablet Take 180 mg by mouth daily.   03/25/2020 at Unknown time  . ascorbic acid (VITAMIN C) 1000 MG tablet Take 1,000 mg by mouth 2 (two)  times daily. (Patient not taking: Reported on 03/25/2020)   Not Taking at Unknown time  . B Complex-C (B-COMPLEX WITH VITAMIN C) tablet Take 1 tablet by mouth daily.     . Cholecalciferol 25 MCG (1000 UT) tablet Take by mouth.     . collagenase (SANTYL) ointment Apply 1 application topically daily. (Patient not taking: Reported on 03/25/2020) 15 g 0 Completed Course at Unknown time  . digoxin 62.5 MCG TABS Take 0.0625 mg by mouth daily. 30 tablet 1   . folic acid (FOLVITE) 409 MCG tablet Take 800 mcg by mouth daily. (Patient not taking: Reported on 03/25/2020)   Not Taking at Unknown time  . Glucosamine HCl (GLUCOSAMINE PO) Take 2 Doses by mouth at bedtime.  (Patient not taking: Reported on 03/25/2020)   Not  Taking at Unknown time  . iron polysaccharides (NIFEREX) 150 MG capsule Take 1 capsule (150 mg total) by mouth daily. (Patient not taking: Reported on 03/25/2020) 30 capsule 0 Not Taking at Unknown time  . metoprolol tartrate (LOPRESSOR) 50 MG tablet Take 1 tablet (50 mg total) by mouth 2 (two) times daily. 60 tablet 1   . metoprolol tartrate (LOPRESSOR) 50 MG tablet Take 1 tablet by mouth 2 (two) times daily. (Patient not taking: Reported on 03/25/2020)   Not Taking at Unknown time  . polymixin-bacitracin (POLYSPORIN) 500-10000 UNIT/GM OINT ointment Apply 1 application topically daily. To left leg wounds (Patient not taking: Reported on 03/25/2020) 28 g 0 Not Taking at Unknown time  . potassium chloride SA (KLOR-CON) 20 MEQ tablet Take 1 tablet (20 mEq total) by mouth daily for 14 days. 14 tablet 0   . selenium 50 MCG TABS tablet Take 50 mcg by mouth daily. (Patient not taking: Reported on 03/25/2020)   Not Taking at Unknown time  . tretinoin (RETIN-A) 0.05 % cream Apply 1 Dose topically at bedtime. (Patient not taking: Reported on 03/25/2020)   Not Taking at Unknown time   Scheduled: . amiodarone  200 mg Oral Daily  . B-complex with vitamin C  1 tablet Oral Daily  . cholecalciferol  1,000 Units Oral  Daily  . iron polysaccharides  150 mg Oral Daily  . melatonin  5 mg Oral QHS  . pantoprazole (PROTONIX) IV  40 mg Intravenous Q12H  . polyethylene glycol  34 g Oral BID  . pravastatin  40 mg Oral q1800   Continuous: . iron sucrose 200 mg (03/27/20 1053)   NID:[id number], hydrALAZINE, ondansetron (ZOFRAN) IV Anti-infectives (From admission, onward)   None     Scheduled Meds: . amiodarone  200 mg Oral Daily  . B-complex with vitamin C  1 tablet Oral Daily  . cholecalciferol  1,000 Units Oral Daily  . iron polysaccharides  150 mg Oral Daily  . melatonin  5 mg Oral QHS  . pantoprazole (PROTONIX) IV  40 mg Intravenous Q12H  . polyethylene glycol  34 g Oral BID  . pravastatin  40 mg Oral q1800   Continuous Infusions: . iron sucrose 200 mg (03/27/20 1053)   PRN Meds:.acetaminophen, hydrALAZINE, ondansetron (ZOFRAN) IV   Assessment: Principal Problem:   Symptomatic anemia Active Problems:   HTN (hypertension)   Permanent atrial fibrillation (HCC)   Hypokalemia   Iron deficiency anemia   HLD (hyperlipidemia)   Plan: [name] [name] is a 87 y.o. female with history of A. fib on Xarelto, bilateral lower extremity cellulitis, iron deficiency anemia who is admitted with acute on chronic iron deficiency anemia with no evidence of active GI bleed CT revealed gastric wall thickening Patient had a colonoscopy in 07/2014, found to have pancolonic diverticulosis, internal hemorrhoids, 3 subcentimeter polyps were removed, tubular adenomas on pathology  Iron deficiency anemia, most likely secondary to insufficient iron replacement as outpatient Ongoing iron deficiency anemia PRN blood transfusion to keep hemoglobin above 8 Received Venofer x2 Continue to hold Xarelto at least for 48 hours in order to perform upper endoscopy Patient's respiratory status has significantly improved, therefore will tentatively proceed with upper endoscopy tomorrow Obtain chest x-ray tomorrow  morning Continue pantoprazole 40 mg IV twice daily Check H. pylori stool antigen N.p.o. effective 5 AM tomorrow  Thank you for involving me in the care of this patient.    Dr. Allen Norris to cover from tomorrow   LOS: 1 day  [name] [name] 03/27/2020, 5:43 PM

## 2020-03-27 NOTE — Progress Notes (Signed)
PROGRESS NOTE    BRITTNEE GAETANO  TGY:[id number] DOB: 04-19-1929 DOA: 03/25/2020 PCP: [name], MD    Assessment & Plan:   Principal Problem:   Symptomatic anemia Active Problems:   HTN (hypertension)   Permanent atrial fibrillation (HCC)   Hypokalemia   Iron deficiency anemia   HLD (hyperlipidemia)   Genesee H Hinckley is a 87 y.o. female with medical history significant of atrial fibrillation on Xarelto, hypertension, hyperlipidemia, hemorrhoids, hearing loss, diverticulosis, iron deficiency anemia, Bell's palsy, anxiety, who presents with fatigue and abnormal labs of hemoglobin 6.7 and potassium 2.7.   Symptomatic anemia  FOBT positive.  Hgb 10.3 -->6.5. Dr. Marius Ditch of GI is consulted.  Since patient is on Xarelto, pt needs to wait 2 days. --s/p 1u pRBC --started on IV PPI BID  --started on IVF, d/c'ed. Plan: --Monitor Hgb, transfuse to keep Hgb >7 --cont IV PPI BID for now --plan for EGD tomorrow  Hx of iron def anemia --IV iron 200 mg daily x 3 days --cont oral iron supplement  Acute hypoxic respiratory failure 2/2 Acute on chronic diastolic CHF exacerbation 2/2 volume overload from blood transfusion and IVF --New 4L O2 requirement on 2/26 with respiratory distress.  CXR showed finding suggestive of CHF.  Started on IV lasix 40 mg. --TTE showed grade 2 diastolic dysfunction Plan: --repeat IV lasix 40 mg x1 today --Strict I/O --Continue supplemental O2 to keep sats >=92%, wean as tolerated  HTN (hypertension) --BP intermittently soft --Hold home verapamil  --cont IV diuresis  Permanent atrial fibrillation (HCC) on eliquis -Hold Xarelto for upcoming EGD -Continue amiodarone -Patient is not sure if she is taking digoxin currently, will hold  Hypokalemia:  --monitor and replete with oral potassium  HLD (hyperlipidemia) -Pravastatin   DVT prophylaxis: SCD/Compression stockings Code Status: Limited code  Family Communication:  Level of  care: Med-Surg Dispo:   The patient is from: home Anticipated d/c is to: home Anticipated d/c date is: 2-3 days Patient currently is not medically ready to d/c due to: need EGD, also on IV lasix for CHF exacerbation with hypoxia.   Subjective and Interval History:  Pt reported breathing much improved.  Has been voiding a lot.     Objective: Vitals:   03/27/20 0034 03/27/20 0818 03/27/20 1122 03/27/20 1523  BP: 124/66 121/73 (!) 158/74 (!) 133/109  Pulse: 80 79 86 86  Resp: 18 18 18 18   Temp: 98.7 F (37.1 C) 99.2 F (37.3 C) 99.1 F (37.3 C) 98.8 F (37.1 C)  TempSrc: Oral Oral    SpO2: 96% 98% 98% 95%  Weight:      Height:        Intake/Output Summary (Last 24 hours) at 03/27/2020 1602 Last data filed at 03/27/2020 0818 Gross per 24 hour  Intake 480 ml  Output 500 ml  Net -20 ml   Filed Weights   03/25/20 1108 03/25/20 1539  Weight: 54.4 kg 54.2 kg    Examination:   Constitutional: NAD, AAOx3, comfortable HEENT: conjunctivae and lids normal, EOMI, very hard of hearing CV: No cyanosis.   RESP: normal respiratory effort, on 4L Extremities: No effusions, edema in BLE SKIN: warm, dry Neuro: II - XII grossly intact.   Psych: Normal mood and affect.  Appropriate judgement and reason   Data Reviewed: I have personally reviewed following labs and imaging studies  CBC: Recent Labs  Lab 03/25/20 1133 03/26/20 0215 03/26/20 0710 03/26/20 1316 03/27/20 0510  WBC 10.2 9.7 11.1* 16.4* 12.1*  NEUTROABS 6.8  --   --   --   --  HGB 6.5* 7.8* 7.7* 9.2* 7.8*  HCT 22.0* 25.0* 25.1* 31.1* 26.0*  MCV 77.5* 78.6* 79.7* 81.6 80.0  PLT 326 264 288 366 160   Basic Metabolic Panel: Recent Labs  Lab 03/25/20 1133 03/26/20 0215 03/27/20 0510  NA 141 144 140  K 2.3* 3.8 3.2*  CL 101 109 105  CO2 29 27 26   GLUCOSE 147* 92 83  BUN 14 12 13   CREATININE 0.87 0.74 0.75  CALCIUM 8.7* 8.4* 8.6*  MG 2.0  --  2.2   GFR: Estimated Creatinine Clearance: 39.4 mL/min (by  C-G formula based on SCr of 0.75 mg/dL). Liver Function Tests: Recent Labs  Lab 03/25/20 1133  AST 24  ALT 16  ALKPHOS 67  BILITOT 0.7  PROT 6.8  ALBUMIN 3.3*   No results for input(s): LIPASE, AMYLASE in the last 168 hours. No results for input(s): AMMONIA in the last 168 hours. Coagulation Profile: Recent Labs  Lab 03/25/20 1133  INR 2.2*   Cardiac Enzymes: No results for input(s): CKTOTAL, CKMB, CKMBINDEX, TROPONINI in the last 168 hours. BNP (last 3 results) No results for input(s): PROBNP in the last 8760 hours. HbA1C: No results for input(s): HGBA1C in the last 72 hours. CBG: Recent Labs  Lab 03/27/20 0818  GLUCAP 91   Lipid Profile: No results for input(s): CHOL, HDL, LDLCALC, TRIG, CHOLHDL, LDLDIRECT in the last 72 hours. Thyroid Function Tests: No results for input(s): TSH, T4TOTAL, FREET4, T3FREE, THYROIDAB in the last 72 hours. Anemia Panel: Recent Labs    03/25/20 1133  FOLATE 41.0  FERRITIN 17  TIBC 403  IRON 22*  RETICCTPCT 2.1   Sepsis Labs: No results for input(s): PROCALCITON, LATICACIDVEN in the last 168 hours.  Recent Results (from the past 240 hour(s))  Resp Panel by RT-PCR (Flu A&B, Covid) Nasopharyngeal Swab     Status: None   Collection Time: 03/25/20 11:33 AM   Specimen: Nasopharyngeal Swab; Nasopharyngeal(NP) swabs in vial transport medium  Result Value Ref Range Status   SARS Coronavirus 2 by RT PCR NEGATIVE NEGATIVE Final    Comment: (NOTE) SARS-CoV-2 target nucleic acids are NOT DETECTED.  The SARS-CoV-2 RNA is generally detectable in upper respiratory specimens during the acute phase of infection. The lowest concentration of SARS-CoV-2 viral copies this assay can detect is 138 copies/mL. A negative result does not preclude SARS-Cov-2 infection and should not be used as the sole basis for treatment or other patient management decisions. A negative result may occur with  improper specimen collection/handling, submission of  specimen other than nasopharyngeal swab, presence of viral mutation(s) within the areas targeted by this assay, and inadequate number of viral copies(<138 copies/mL). A negative result must be combined with clinical observations, patient history, and epidemiological information. The expected result is Negative.  Fact Sheet for Patients:  EntrepreneurPulse.com.au  Fact Sheet for Healthcare Providers:  IncredibleEmployment.be  This test is no t yet approved or cleared by the [place] FDA and  has been authorized for detection and/or diagnosis of SARS-CoV-2 by FDA under an Emergency Use Authorization (EUA). This EUA will remain  in effect (meaning this test can be used) for the duration of the COVID-19 declaration under Section 564(b)(1) of the Act, 21 U.S.C.section 360bbb-3(b)(1), unless the authorization is terminated  or revoked sooner.       Influenza A by PCR NEGATIVE NEGATIVE Final   Influenza B by PCR NEGATIVE NEGATIVE Final    Comment: (NOTE) The Xpert Xpress SARS-CoV-2/FLU/RSV plus assay is intended as an  aid in the diagnosis of influenza from Nasopharyngeal swab specimens and should not be used as a sole basis for treatment. Nasal washings and aspirates are unacceptable for Xpert Xpress SARS-CoV-2/FLU/RSV testing.  Fact Sheet for Patients: EntrepreneurPulse.com.au  Fact Sheet for Healthcare Providers: IncredibleEmployment.be  This test is not yet approved or cleared by the [place] FDA and has been authorized for detection and/or diagnosis of SARS-CoV-2 by FDA under an Emergency Use Authorization (EUA). This EUA will remain in effect (meaning this test can be used) for the duration of the COVID-19 declaration under Section 564(b)(1) of the Act, 21 U.S.C. section 360bbb-3(b)(1), unless the authorization is terminated or revoked.  Performed at [facility], [address] [place], [place] [address]       Radiology Studies: [facility] Chest [place] 1 View  Result Date: 03/26/2020 CLINICAL DATA:  87 year old female with history of respiratory failure and hypoxia. EXAM: PORTABLE CHEST 1 VIEW COMPARISON:  Chest x-ray 01/15/2020. FINDINGS: There is cephalization of the pulmonary vasculature, indistinctness of the interstitial markings, and patchy airspace disease throughout the lungs bilaterally suggestive of moderate pulmonary edema. Moderate to large bilateral pleural effusions. Cardiopericardial silhouette is partially obscured but appears enlarged. Upper mediastinal contours are within normal limits. Aortic atherosclerosis. IMPRESSION: 1. The appearance the chest suggests congestive heart failure, as above. 2. Moderate enlargement of the cardiopericardial silhouette, which appears significantly worsened compared to the prior chest x-ray from January 15, 2020. This could reflect underlying cardiomegaly, however, clinical correlation for signs and symptoms of pericardial effusion is suggested. 3. Aortic atherosclerosis. Electronically Signed   By: Vinnie Langton M.D.   On: 03/26/2020 12:57   ECHOCARDIOGRAM COMPLETE  Result Date: 03/27/2020    ECHOCARDIOGRAM REPORT   Patient Name:   SAKSHI SERMONS Date of Exam: 03/27/2020 Medical Rec #:  [id number]           Height:       63.0 in Accession #:    [id number]          Weight:       119.5 lb Date of Birth:  01/19/1930           BSA:          1.553 m Patient Age:    73 years            BP:           124/66 mmHg Patient Gender: F                   HR:           80 bpm. Exam Location:  [facility] Procedure: 2D Echo Indications:     Acute Respiratory Distress R06.03  History:         Patient has no prior history of Echocardiogram examinations.  Sonographer:     Arville Go RDCS Referring Phys:  [id number] Howard Diagnosing Phys: [name] MD IMPRESSIONS  1. Left ventricular ejection fraction, by estimation, is 60 to 65%. The left  ventricle has normal function. The left ventricle demonstrates regional wall motion abnormalities (see scoring diagram/findings for description). There is mild left ventricular  hypertrophy. Left ventricular diastolic parameters are consistent with Grade II diastolic dysfunction (pseudonormalization).  2. Right ventricular systolic function is normal. The right ventricular size is normal.  3. Moderate pleural effusion in the left lateral region.  4. The mitral valve is myxomatous. Moderate to severe mitral valve regurgitation. Moderate mitral annular calcification.  5. The aortic  valve is normal in structure. Aortic valve regurgitation is trivial. FINDINGS  Left Ventricle: Left ventricular ejection fraction, by estimation, is 60 to 65%. The left ventricle has normal function. The left ventricle demonstrates regional wall motion abnormalities. The left ventricular internal cavity size was normal in size. There is mild left ventricular hypertrophy. Left ventricular diastolic parameters are consistent with Grade II diastolic dysfunction (pseudonormalization). Right Ventricle: The right ventricular size is normal. No increase in right ventricular wall thickness. Right ventricular systolic function is normal. Left Atrium: Left atrial size was normal in size. Right Atrium: Right atrial size was normal in size. Pericardium: There is no evidence of pericardial effusion. Mitral Valve: The mitral valve is myxomatous. There is severe thickening of the anterior mitral valve leaflet(s). There is severe calcification of the anterior mitral valve leaflet(s). Normal mobility of the mitral valve leaflets. Moderate mitral annular  calcification. Moderate to severe mitral valve regurgitation. Tricuspid Valve: The tricuspid valve is normal in structure. Tricuspid valve regurgitation is mild. Aortic Valve: The aortic valve is normal in structure. Aortic valve regurgitation is trivial. Aortic regurgitation PHT measures 423 msec. Aortic  valve peak gradient measures 8.9 mmHg. Pulmonic Valve: The pulmonic valve was grossly normal. Pulmonic valve regurgitation is not visualized. Aorta: The ascending aorta was not well visualized. IAS/Shunts: No atrial level shunt detected by color flow Doppler. Additional Comments: There is a moderate pleural effusion in the left lateral region.  LEFT VENTRICLE PLAX 2D LVIDd:         4.75 cm  Diastology LVIDs:         3.18 cm  LV e' medial:   5.44 cm/s LV PW:         0.94 cm  LV E/e' medial: 17.8 LV IVS:        0.94 cm LVOT diam:     1.80 cm LV SV:         48 LV SV Index:   31 LVOT Area:     2.54 cm  RIGHT VENTRICLE RV Basal diam:  2.19 cm RV S prime:     13.50 cm/s TAPSE (M-mode): 2.0 cm LEFT ATRIUM             Index       RIGHT ATRIUM           Index LA diam:        3.10 cm 2.00 cm/m  RA Area:     13.50 cm LA Vol (A2C):   72.1 ml 46.41 ml/m RA Volume:   32.70 ml  21.05 ml/m LA Vol (A4C):   50.8 ml 32.70 ml/m LA Biplane Vol: 61.0 ml 39.27 ml/m  AORTIC VALVE                PULMONIC VALVE AV Area (Vmax): 1.47 cm    PV Vmax:       1.09 m/s AV Vmax:        149.00 cm/s PV Peak grad:  4.8 mmHg AV Peak Grad:   8.9 mmHg LVOT Vmax:      86.20 cm/s LVOT Vmean:     50.600 cm/s LVOT VTI:       0.188 m AI PHT:         423 msec  AORTA Ao Root diam: 2.80 cm Ao Asc diam:  2.80 cm MITRAL VALVE                TRICUSPID VALVE MV Area (PHT): 4.71 cm     TV Peak grad:  35.8 mmHg MV Decel Time: 161 msec     TV Vmax:        2.99 m/s MV E velocity: 96.80 cm/s MV A velocity: 106.00 cm/s  SHUNTS MV E/A ratio:  0.91         Systemic VTI:  0.19 m                             Systemic Diam: 1.80 cm [name] MD Electronically signed by [name] MD Signature Date/Time: 03/27/2020/3:06:22 PM    Final      Scheduled Meds: . amiodarone  200 mg Oral Daily  . B-complex with vitamin C  1 tablet Oral Daily  . cholecalciferol  1,000 Units Oral Daily  . iron polysaccharides  150 mg Oral Daily  . melatonin  5 mg Oral QHS   . pantoprazole (PROTONIX) IV  40 mg Intravenous Q12H  . polyethylene glycol  34 g Oral BID  . pravastatin  40 mg Oral q1800   Continuous Infusions: . iron sucrose 200 mg (03/27/20 1053)     LOS: 1 day     Enzo Bi, MD [facility] If 7PM-7AM, please contact night-coverage 03/27/2020, 4:02 PM

## 2020-03-28 ENCOUNTER — Inpatient Hospital Stay: Payer: Medicare Other | Admitting: Anesthesiology

## 2020-03-28 ENCOUNTER — Inpatient Hospital Stay: Payer: Medicare Other

## 2020-03-28 ENCOUNTER — Encounter: Payer: Self-pay | Admitting: Hospitalist

## 2020-03-28 ENCOUNTER — Encounter
Admission: EM | Disposition: A | Discharge status: Home Health | Payer: Self-pay | Source: Home / Self Care | Attending: Internal Medicine

## 2020-03-28 DIAGNOSIS — D649 Anemia, unspecified: Secondary | ICD-10-CM | POA: Diagnosis not present

## 2020-03-28 DIAGNOSIS — I5033 Acute on chronic diastolic (congestive) heart failure: Secondary | ICD-10-CM

## 2020-03-28 DIAGNOSIS — D509 Iron deficiency anemia, unspecified: Secondary | ICD-10-CM | POA: Diagnosis not present

## 2020-03-28 DIAGNOSIS — R911 Solitary pulmonary nodule: Secondary | ICD-10-CM | POA: Diagnosis not present

## 2020-03-28 HISTORY — PX: ESOPHAGOGASTRODUODENOSCOPY (EGD) WITH PROPOFOL: SHX5813

## 2020-03-28 LAB — CBC
HCT: 31.4 % — ABNORMAL LOW (ref 36.0–46.0)
Hemoglobin: 9.4 g/dL — ABNORMAL LOW (ref 12.0–15.0)
MCH: 24.4 pg — ABNORMAL LOW (ref 26.0–34.0)
MCHC: 29.9 g/dL — ABNORMAL LOW (ref 30.0–36.0)
MCV: 81.3 fL (ref 80.0–100.0)
Platelets: 332 10*3/uL (ref 150–400)
RBC: 3.86 MIL/uL — ABNORMAL LOW (ref 3.87–5.11)
RDW: 23.3 % — ABNORMAL HIGH (ref 11.5–15.5)
WBC: 12.6 10*3/uL — ABNORMAL HIGH (ref 4.0–10.5)
nRBC: 1.1 % — ABNORMAL HIGH (ref 0.0–0.2)

## 2020-03-28 LAB — BASIC METABOLIC PANEL
Anion gap: 8 (ref 5–15)
BUN: 13 mg/dL (ref 8–23)
CO2: 27 mmol/L (ref 22–32)
Calcium: 9.1 mg/dL (ref 8.9–10.3)
Chloride: 105 mmol/L (ref 98–111)
Creatinine, Ser: 0.73 mg/dL (ref 0.44–1.00)
GFR, Estimated: >60 mL/min (ref >60)
Glucose, Bld: 87 mg/dL (ref 70–99)
Potassium: 4.1 mmol/L (ref 3.5–5.1)
Sodium: 140 mmol/L (ref 135–145)

## 2020-03-28 LAB — MAGNESIUM: Magnesium: 2.3 mg/dL (ref 1.7–2.4)

## 2020-03-28 LAB — MRSA PCR SCREENING: MRSA by PCR: POSITIVE — AB

## 2020-03-28 LAB — GLUCOSE, CAPILLARY: Glucose-Capillary: 97 mg/dL (ref 70–99)

## 2020-03-28 SURGERY — ESOPHAGOGASTRODUODENOSCOPY (EGD) WITH PROPOFOL
Anesthesia: General

## 2020-03-28 MED ORDER — PROPOFOL 10 MG/ML IV BOLUS
INTRAVENOUS | Status: DC | PRN
  Administered 2020-03-28: 30 mg via INTRAVENOUS
  Administered 2020-03-28: 10 mg via INTRAVENOUS

## 2020-03-28 MED ORDER — LIDOCAINE HCL (CARDIAC) PF 100 MG/5ML IV SOSY
PREFILLED_SYRINGE | INTRAVENOUS | Status: DC | PRN
  Administered 2020-03-28: 40 mg via INTRAVENOUS

## 2020-03-28 MED ORDER — CHLORHEXIDINE GLUCONATE CLOTH 2 % EX PADS
6.0000 | MEDICATED_PAD | Freq: Every day | CUTANEOUS | Status: AC
  Administered 2020-03-29 – 2020-04-02 (×5): 6 via TOPICAL

## 2020-03-28 MED ORDER — IOHEXOL 300 MG/ML  SOLN
75.0000 mL | Freq: Once | INTRAMUSCULAR | Status: AC | PRN
  Administered 2020-03-28: 14:00:00 75 mL via INTRAVENOUS

## 2020-03-28 MED ORDER — HYDROCORTISONE 1 % EX CREA
TOPICAL_CREAM | Freq: Two times a day (BID) | CUTANEOUS | Status: DC | PRN
  Filled 2020-03-28: qty 28

## 2020-03-28 MED ORDER — MUPIROCIN 2 % EX OINT
1.0000 "application " | TOPICAL_OINTMENT | Freq: Two times a day (BID) | CUTANEOUS | Status: AC
  Administered 2020-03-28 – 2020-04-02 (×10): 1 via NASAL
  Filled 2020-03-28: qty 22

## 2020-03-28 MED ORDER — SODIUM CHLORIDE 0.9 % IV SOLN
INTRAVENOUS | Status: DC | PRN
  Administered 2020-03-28: 08:00:00 250 mL via INTRAVENOUS

## 2020-03-28 MED ORDER — FUROSEMIDE 10 MG/ML IJ SOLN
40.0000 mg | Freq: Every day | INTRAMUSCULAR | Status: DC
  Administered 2020-03-28 – 2020-03-31 (×4): 40 mg via INTRAVENOUS
  Filled 2020-03-28 (×3): qty 4

## 2020-03-28 NOTE — Anesthesia Postprocedure Evaluation (Signed)
Anesthesia Post Note  Patient: [name] [name]  Procedure(s) Performed: ESOPHAGOGASTRODUODENOSCOPY (EGD) WITH PROPOFOL (N/A )  Patient location during evaluation: Endoscopy Anesthesia Type: General Level of consciousness: awake and alert Pain management: pain level controlled Vital Signs Assessment: post-procedure vital signs reviewed and stable Respiratory status: spontaneous breathing, nonlabored ventilation, respiratory function stable and patient connected to nasal cannula oxygen Cardiovascular status: blood pressure returned to baseline and stable Postop Assessment: no apparent nausea or vomiting Anesthetic complications: no   No complications documented.   Last Vitals:  Vitals:   03/28/20 1254 03/28/20 1318  BP: (!) 151/69 (!) 156/73  Pulse: 85 87  Resp: 20 14  Temp:  36.8 C  SpO2: 96% 95%    Last Pain:  Vitals:   03/28/20 1254  TempSrc:   PainSc: 0-No pain                 Arita Miss

## 2020-03-28 NOTE — Anesthesia Procedure Notes (Signed)
Procedure Name: MAC Date/Time: 03/28/2020 12:07 PM Performed by: Jerrye Noble, CRNA Pre-anesthesia Checklist: Patient identified, Emergency Drugs available, Suction available and Patient being monitored Patient Re-evaluated:Patient Re-evaluated prior to induction

## 2020-03-28 NOTE — Anesthesia Preprocedure Evaluation (Addendum)
Anesthesia Evaluation  Patient identified by MRN, date of birth, ID band Patient awake    Reviewed: Allergy & Precautions, NPO status , Patient's Chart, lab work & pertinent test results  History of Anesthesia Complications Negative for: history of anesthetic complications  Airway Mallampati: II  TM Distance: >3 FB Neck ROM: Full    Dental  (+) Teeth Intact   Pulmonary neg pulmonary ROS, neg sleep apnea, neg COPD, Patient abstained from smoking.Not current smoker, former smoker,    Pulmonary exam normal breath sounds clear to auscultation       Cardiovascular Exercise Tolerance: Poor METShypertension, (-) CAD and (-) Past MI (-) dysrhythmias + Valvular Problems/Murmurs MR  Rhythm:Regular Rate:Normal - Systolic murmurs TTE 09/7024: 1. Left ventricular ejection fraction, by estimation, is 60 to 65%. The  left ventricle has normal function. The left ventricle demonstrates  regional wall motion abnormalities (see scoring diagram/findings for  description). There is mild left ventricular  hypertrophy. Left ventricular diastolic parameters are consistent with  Grade II diastolic dysfunction (pseudonormalization).  2. Right ventricular systolic function is normal. The right ventricular  size is normal.  3. Moderate pleural effusion in the left lateral region.  4. The mitral valve is myxomatous. Moderate to severe mitral valve  regurgitation. Moderate mitral annular calcification.  5. The aortic valve is normal in structure. Aortic valve regurgitation is  trivial.    Neuro/Psych Anxiety negative neurological ROS  negative psych ROS   GI/Hepatic neg GERD  ,(+)     (-) substance abuse  ,   Endo/Other  neg diabetes  Renal/GU negative Renal ROS     Musculoskeletal   Abdominal   Peds  Hematology  (+) anemia ,   Anesthesia Other Findings Past Medical History: No date: Anxiety No date: Cancer [facility])     Comment:   Squamous Cell Carcinoma (top of head) No date: Constipation, chronic No date: Deaf, left No date: Diverticular disease No date: Hearing aid worn No date: Hearing deficit No date: History of Bell's palsy     Comment:  left side - resolved - 50 yrs ago No date: Hypertension No date: Internal hemorrhoids No date: Osteopenia No date: Pure hypercholesterolemia  Reproductive/Obstetrics                            Anesthesia Physical Anesthesia Plan  ASA: III  Anesthesia Plan: General   Post-op Pain Management:    Induction: Intravenous  PONV Risk Score and Plan: 3 and Ondansetron, Propofol infusion and TIVA  Airway Management Planned: Nasal Cannula  Additional Equipment: None  Intra-op Plan:   Post-operative Plan:   Informed Consent: I have reviewed the patients History and Physical, chart, labs and discussed the procedure including the risks, benefits and alternatives for the proposed anesthesia with the patient or authorized representative who has indicated his/her understanding and acceptance.   Patient has DNR.  Discussed DNR with patient and Suspend DNR.   Dental advisory given  Plan Discussed with: CRNA and Surgeon  Anesthesia Plan Comments: (Discussed risks of anesthesia with patient, including possibility of difficulty with spontaneous ventilation under anesthesia necessitating airway intervention, PONV, and rare risks such as cardiac or respiratory or neurological events. Patient understands.)       Anesthesia Quick Evaluation

## 2020-03-28 NOTE — Transfer of Care (Signed)
Immediate Anesthesia Transfer of Care Note  Patient: [name] [name]  Procedure(s) Performed: ESOPHAGOGASTRODUODENOSCOPY (EGD) WITH PROPOFOL (N/A )  Patient Location: PACU and Endoscopy Unit  Anesthesia Type:General  Level of Consciousness: awake, alert  and patient cooperative  Airway & Oxygen Therapy: Patient Spontanous Breathing  Post-op Assessment: Report given to RN and Post -op Vital signs reviewed and stable  Post vital signs: Reviewed and stable  Last Vitals:  Vitals Value Taken Time  BP    Temp    Pulse    Resp    SpO2      Last Pain:  Vitals:   03/28/20 0822  TempSrc: Oral  PainSc: 0-No pain         Complications: No complications documented.

## 2020-03-28 NOTE — Progress Notes (Signed)
PT Cancellation Note  Patient Details Name: ADDISYN LECLAIRE MRN: [id number] DOB: 18-Jul-1929   Cancelled Treatment:    Reason Eval/Treat Not Completed: Other (comment). Patient being cleaned up, nursing in room. Will re-attempt later if time allows, otherwise will see tomorrow.       Conetta,Kristyn 03/28/2020, 2:41 PM

## 2020-03-28 NOTE — Progress Notes (Signed)
PROGRESS NOTE    [name] [name]  XKG:[id number] DOB: 12/07/1929 DOA: 03/25/2020 PCP: [name], MD    Assessment & Plan:   Principal Problem:   Symptomatic anemia Active Problems:   HTN (hypertension)   Permanent atrial fibrillation (HCC)   Hypokalemia   Iron deficiency anemia   HLD (hyperlipidemia)   Symptomatic anemia: w/ fecal occult positive. S/p 1 unit of pRBCs transfused. Continue on PPI. Continue to hold xarelto. EGD today  IDA: completed IV iron x 3 days. Continue on po iron supplements   Left lung nodule: 4.57mm on CXR. CT chest ordered to further evaluate. Hx of smoking but quit in 1987  Acute hypoxic respiratory failure: secondary to CHF exacerbation. Monitor I/Os. Continue on IV lasix  Acute on chronic diastolic CHF exacerbation: continue on IV lasix. Monitor I/Os.   HTN: continue to hold home dose of verapamil   Permanent atrial fibrillation: continue to hold home dose of xarelto. Continue on amiodarone   Leukocytosis: likely reactive. Will continue to monitor   Hypokalemia: WNL today   HLD: continue on statin    DVT prophylaxis: SCDs Code Status: partial  Family Communication: discussed pt's care w/ pt's son, Percell Miller and answered his questions Disposition Plan: depends on PT/OT recs   Level of care: Med-Surg   Consultants:   GI   Procedures:    Antimicrobials:   Status is: Inpatient  Remains inpatient appropriate because:Ongoing diagnostic testing needed not appropriate for outpatient work up, IV treatments appropriate due to intensity of illness or inability to take PO and Inpatient level of care appropriate due to severity of illness   Dispo: The patient is from: Home              Anticipated d/c is to: Home              Patient currently is not medically stable to d/c.   Difficult to place patient No      Subjective: Pt c/o malaise   Objective: Vitals:   03/27/20 1954 03/28/20 0029 03/28/20 0500 03/28/20  0610  BP: 134/83 (!) 150/70  (!) 160/63  Pulse: 79 84  81  Resp: 20 20  20   Temp: 99.1 F (37.3 C) 98.3 F (36.8 C)  98.6 F (37 C)  TempSrc:      SpO2: 95% 97%  93%  Weight:   54.1 kg   Height:        Intake/Output Summary (Last 24 hours) at 03/28/2020 0744 Last data filed at 03/27/2020 0818 Gross per 24 hour  Intake 240 ml  Output --  Net 240 ml   Filed Weights   03/25/20 1108 03/25/20 1539 03/28/20 0500  Weight: 54.4 kg 54.2 kg 54.1 kg    Examination:  General exam: Appears calm and comfortable  Respiratory system: diminished breath sounds b/l  Cardiovascular system: S1 & S2 +. No rubs, gallops or clicks.  Gastrointestinal system: Abdomen is nondistended, soft and nontender. Normal bowel sounds heard. Central nervous system: Alert and oriented. Moves all 4 extremities  Psychiatry: Judgement and insight appear normal. Flat mood and affect      Data Reviewed: I have personally reviewed following labs and imaging studies  CBC: Recent Labs  Lab 03/25/20 1133 03/26/20 0215 03/26/20 0710 03/26/20 1316 03/27/20 0510 03/28/20 0604  WBC 10.2 9.7 11.1* 16.4* 12.1* 12.6*  NEUTROABS 6.8  --   --   --   --   --   HGB 6.5* 7.8* 7.7* 9.2* 7.8* 9.4*  HCT 22.0* 25.0* 25.1* 31.1* 26.0* 31.4*  MCV 77.5* 78.6* 79.7* 81.6 80.0 81.3  PLT 326 264 288 366 298 622   Basic Metabolic Panel: Recent Labs  Lab 03/25/20 1133 03/26/20 0215 03/27/20 0510  NA 141 144 140  K 2.3* 3.8 3.2*  CL 101 109 105  CO2 29 27 26   GLUCOSE 147* 92 83  BUN 14 12 13   CREATININE 0.87 0.74 0.75  CALCIUM 8.7* 8.4* 8.6*  MG 2.0  --  2.2   GFR: Estimated Creatinine Clearance: 39.4 mL/min (by C-G formula based on SCr of 0.75 mg/dL). Liver Function Tests: Recent Labs  Lab 03/25/20 1133  AST 24  ALT 16  ALKPHOS 67  BILITOT 0.7  PROT 6.8  ALBUMIN 3.3*   No results for input(s): LIPASE, AMYLASE in the last 168 hours. No results for input(s): AMMONIA in the last 168 hours. Coagulation  Profile: Recent Labs  Lab 03/25/20 1133  INR 2.2*   Cardiac Enzymes: No results for input(s): CKTOTAL, CKMB, CKMBINDEX, TROPONINI in the last 168 hours. BNP (last 3 results) No results for input(s): PROBNP in the last 8760 hours. HbA1C: No results for input(s): HGBA1C in the last 72 hours. CBG: Recent Labs  Lab 03/27/20 0818  GLUCAP 91   Lipid Profile: No results for input(s): CHOL, HDL, LDLCALC, TRIG, CHOLHDL, LDLDIRECT in the last 72 hours. Thyroid Function Tests: No results for input(s): TSH, T4TOTAL, FREET4, T3FREE, THYROIDAB in the last 72 hours. Anemia Panel: Recent Labs    03/25/20 1133  FOLATE 41.0  FERRITIN 17  TIBC 403  IRON 22*  RETICCTPCT 2.1   Sepsis Labs: No results for input(s): PROCALCITON, LATICACIDVEN in the last 168 hours.  Recent Results (from the past 240 hour(s))  Resp Panel by RT-PCR (Flu A&B, Covid) Nasopharyngeal Swab     Status: None   Collection Time: 03/25/20 11:33 AM   Specimen: Nasopharyngeal Swab; Nasopharyngeal(NP) swabs in vial transport medium  Result Value Ref Range Status   SARS Coronavirus 2 by RT PCR NEGATIVE NEGATIVE Final    Comment: (NOTE) SARS-CoV-2 target nucleic acids are NOT DETECTED.  The SARS-CoV-2 RNA is generally detectable in upper respiratory specimens during the acute phase of infection. The lowest concentration of SARS-CoV-2 viral copies this assay can detect is 138 copies/mL. A negative result does not preclude SARS-Cov-2 infection and should not be used as the sole basis for treatment or other patient management decisions. A negative result may occur with  improper specimen collection/handling, submission of specimen other than nasopharyngeal swab, presence of viral mutation(s) within the areas targeted by this assay, and inadequate number of viral copies(<138 copies/mL). A negative result must be combined with clinical observations, patient history, and epidemiological information. The expected result is  Negative.  Fact Sheet for Patients:  EntrepreneurPulse.com.au  Fact Sheet for Healthcare Providers:  IncredibleEmployment.be  This test is no t yet approved or cleared by the [place] FDA and  has been authorized for detection and/or diagnosis of SARS-CoV-2 by FDA under an Emergency Use Authorization (EUA). This EUA will remain  in effect (meaning this test can be used) for the duration of the COVID-19 declaration under Section 564(b)(1) of the Act, 21 U.S.C.section 360bbb-3(b)(1), unless the authorization is terminated  or revoked sooner.       Influenza A by PCR NEGATIVE NEGATIVE Final   Influenza B by PCR NEGATIVE NEGATIVE Final    Comment: (NOTE) The Xpert Xpress SARS-CoV-2/FLU/RSV plus assay is intended as an aid in the diagnosis  of influenza from Nasopharyngeal swab specimens and should not be used as a sole basis for treatment. Nasal washings and aspirates are unacceptable for Xpert Xpress SARS-CoV-2/FLU/RSV testing.  Fact Sheet for Patients: EntrepreneurPulse.com.au  Fact Sheet for Healthcare Providers: IncredibleEmployment.be  This test is not yet approved or cleared by the [place] FDA and has been authorized for detection and/or diagnosis of SARS-CoV-2 by FDA under an Emergency Use Authorization (EUA). This EUA will remain in effect (meaning this test can be used) for the duration of the COVID-19 declaration under Section 564(b)(1) of the Act, 21 U.S.C. section 360bbb-3(b)(1), unless the authorization is terminated or revoked.  Performed at [facility], [address], [place], [place] [address]          Radiology Studies: [facility] Chest [place] 1 View  Result Date: 03/26/2020 CLINICAL DATA:  87 year old female with history of respiratory failure and hypoxia. EXAM: PORTABLE CHEST 1 VIEW COMPARISON:  Chest x-ray 01/15/2020. FINDINGS: There is cephalization of the pulmonary  vasculature, indistinctness of the interstitial markings, and patchy airspace disease throughout the lungs bilaterally suggestive of moderate pulmonary edema. Moderate to large bilateral pleural effusions. Cardiopericardial silhouette is partially obscured but appears enlarged. Upper mediastinal contours are within normal limits. Aortic atherosclerosis. IMPRESSION: 1. The appearance the chest suggests congestive heart failure, as above. 2. Moderate enlargement of the cardiopericardial silhouette, which appears significantly worsened compared to the prior chest x-ray from January 15, 2020. This could reflect underlying cardiomegaly, however, clinical correlation for signs and symptoms of pericardial effusion is suggested. 3. Aortic atherosclerosis. Electronically Signed   By: Vinnie Langton M.D.   On: 03/26/2020 12:57   ECHOCARDIOGRAM COMPLETE  Result Date: 03/27/2020    ECHOCARDIOGRAM REPORT   Patient Name:   LARONDA LISBY Date of Exam: 03/27/2020 Medical Rec #:  [id number]           Height:       63.0 in Accession #:    [id number]          Weight:       119.5 lb Date of Birth:  1929-06-29           BSA:          1.553 m Patient Age:    34 years            BP:           124/66 mmHg Patient Gender: F                   HR:           80 bpm. Exam Location:  [facility] Procedure: 2D Echo Indications:     Acute Respiratory Distress R06.03  History:         Patient has no prior history of Echocardiogram examinations.  Sonographer:     Arville Go RDCS Referring Phys:  [id number] [place] Diagnosing Phys: [name] MD IMPRESSIONS  1. Left ventricular ejection fraction, by estimation, is 60 to 65%. The left ventricle has normal function. The left ventricle demonstrates regional wall motion abnormalities (see scoring diagram/findings for description). There is mild left ventricular  hypertrophy. Left ventricular diastolic parameters are consistent with Grade II diastolic dysfunction (pseudonormalization).  2.  Right ventricular systolic function is normal. The right ventricular size is normal.  3. Moderate pleural effusion in the left lateral region.  4. The mitral valve is myxomatous. Moderate to severe mitral valve regurgitation. Moderate mitral annular calcification.  5. The aortic valve  is normal in structure. Aortic valve regurgitation is trivial. FINDINGS  Left Ventricle: Left ventricular ejection fraction, by estimation, is 60 to 65%. The left ventricle has normal function. The left ventricle demonstrates regional wall motion abnormalities. The left ventricular internal cavity size was normal in size. There is mild left ventricular hypertrophy. Left ventricular diastolic parameters are consistent with Grade II diastolic dysfunction (pseudonormalization). Right Ventricle: The right ventricular size is normal. No increase in right ventricular wall thickness. Right ventricular systolic function is normal. Left Atrium: Left atrial size was normal in size. Right Atrium: Right atrial size was normal in size. Pericardium: There is no evidence of pericardial effusion. Mitral Valve: The mitral valve is myxomatous. There is severe thickening of the anterior mitral valve leaflet(s). There is severe calcification of the anterior mitral valve leaflet(s). Normal mobility of the mitral valve leaflets. Moderate mitral annular  calcification. Moderate to severe mitral valve regurgitation. Tricuspid Valve: The tricuspid valve is normal in structure. Tricuspid valve regurgitation is mild. Aortic Valve: The aortic valve is normal in structure. Aortic valve regurgitation is trivial. Aortic regurgitation PHT measures 423 msec. Aortic valve peak gradient measures 8.9 mmHg. Pulmonic Valve: The pulmonic valve was grossly normal. Pulmonic valve regurgitation is not visualized. Aorta: The ascending aorta was not well visualized. IAS/Shunts: No atrial level shunt detected by color flow Doppler. Additional Comments: There is a moderate pleural  effusion in the left lateral region.  LEFT VENTRICLE PLAX 2D LVIDd:         4.75 cm  Diastology LVIDs:         3.18 cm  LV e' medial:   5.44 cm/s LV PW:         0.94 cm  LV E/e' medial: 17.8 LV IVS:        0.94 cm LVOT diam:     1.80 cm LV SV:         48 LV SV Index:   31 LVOT Area:     2.54 cm  RIGHT VENTRICLE RV Basal diam:  2.19 cm RV S prime:     13.50 cm/s TAPSE (M-mode): 2.0 cm LEFT ATRIUM             Index       RIGHT ATRIUM           Index LA diam:        3.10 cm 2.00 cm/m  RA Area:     13.50 cm LA Vol (A2C):   72.1 ml 46.41 ml/m RA Volume:   32.70 ml  21.05 ml/m LA Vol (A4C):   50.8 ml 32.70 ml/m LA Biplane Vol: 61.0 ml 39.27 ml/m  AORTIC VALVE                PULMONIC VALVE AV Area (Vmax): 1.47 cm    PV Vmax:       1.09 m/s AV Vmax:        149.00 cm/s PV Peak grad:  4.8 mmHg AV Peak Grad:   8.9 mmHg LVOT Vmax:      86.20 cm/s LVOT Vmean:     50.600 cm/s LVOT VTI:       0.188 m AI PHT:         423 msec  AORTA Ao Root diam: 2.80 cm Ao Asc diam:  2.80 cm MITRAL VALVE                TRICUSPID VALVE MV Area (PHT): 4.71 cm     TV Peak grad:  35.8 mmHg MV Decel Time: 161 msec     TV Vmax:        2.99 m/s MV E velocity: 96.80 cm/s MV A velocity: 106.00 cm/s  SHUNTS MV E/A ratio:  0.91         Systemic VTI:  0.19 m                             Systemic Diam: 1.80 cm [name] MD Electronically signed by [name] MD Signature Date/Time: 03/27/2020/3:06:22 PM    Final         Scheduled Meds: . amiodarone  200 mg Oral Daily  . B-complex with vitamin C  1 tablet Oral Daily  . cholecalciferol  1,000 Units Oral Daily  . iron polysaccharides  150 mg Oral Daily  . melatonin  5 mg Oral QHS  . pantoprazole (PROTONIX) IV  40 mg Intravenous Q12H  . polyethylene glycol  34 g Oral BID  . pravastatin  40 mg Oral q1800   Continuous Infusions: . iron sucrose 200 mg (03/27/20 1053)     LOS: 2 days    Time spent: 35 mins     Wyvonnia Dusky, MD [facility] Pager [phone]  [phone]  If 7PM-7AM, please contact night-coverage 03/28/2020, 7:44 AM

## 2020-03-28 NOTE — Evaluation (Signed)
Physical Therapy Evaluation Patient Details Name: NEIRA BENTSEN MRN: [id number] DOB: 01-Apr-1929 Today's Date: 03/28/2020   History of Present Illness  Jovanna SANTOS HARDWICK is a 87 y.o. female with medical history significant of atrial fibrillation on Xarelto, hypertension, hyperlipidemia, hemorrhoids, hearing loss, diverticulosis, iron deficiency anemia, Bell's palsy, anxiety, who presents with fatigue and abnormal lab.     Patient states that she has been having generalized weakness and fatigue for almost 4 weeks, which has progressively worsened in the past several days.  Patient was seen by PCP yesterday and found to have hemoglobin 6.7 and potassium 2.7.  Patient denies rectal bleeding or dark stool.  Clinical Impression  Patient received in bed, reports she is full, she just finished lunch. Agrees to PT assessement.  Patient is mod independent with bed mobility. Transfers with min assist pulling up on rollator. Patient ambulated out of her room door and reports she needs to go to the bathroom. Assisted patient to bathroom, but she had already soiled diaper that she was wearing. Assisted patient with cleaning up in bathroom and assisted back to bed. She requires min guard/min assist for ambulation. Patient will continue to benefit from skilled PT while here to improve strength and functional independence for safe return home.         Follow Up Recommendations Home health PT    Equipment Recommendations  None recommended by PT    Recommendations for Other Services       Precautions / Restrictions Precautions Precautions: Fall Restrictions Weight Bearing Restrictions: No      Mobility  Bed Mobility Overal bed mobility: Modified Independent                  Transfers Overall transfer level: Needs assistance Equipment used: 4-wheeled walker Transfers: Sit to/from Stand Sit to Stand: Min assist            Ambulation/Gait Ambulation/Gait assistance: Min guard;Min  assist Gait Distance (Feet): 25 Feet Assistive device: 4-wheeled walker Gait Pattern/deviations: Step-through pattern;Decreased stride length;Decreased step length - right;Decreased step length - left Gait velocity: decreased   General Gait Details: requires min guard for safety with mobility. 1 lob while standing at sink washing hands. Gait distance limited by sudden need to have BM.  Stairs            Wheelchair Mobility    Modified Rankin (Stroke Patients Only)       Balance Overall balance assessment: Needs assistance Sitting-balance support: Feet supported Sitting balance-Leahy Scale: Good     Standing balance support: Bilateral upper extremity supported;During functional activity Standing balance-Leahy Scale: Fair Standing balance comment: reliant on RW and min guard for safety                             Pertinent Vitals/Pain Pain Assessment: No/denies pain    Home Living Family/patient expects to be discharged to:: Private residence Living Arrangements: Alone Available Help at Discharge: Family;Available PRN/intermittently Type of Home: House Home Access: Stairs to enter Entrance Stairs-Rails: Can reach both Entrance Stairs-Number of Steps: 4 Home Layout: Multi-level;1/2 bath on main level;Bed/bath upstairs Home Equipment: Walker - 2 wheels;Walker - 4 wheels;Cane - single point Additional Comments: pt reports her bed is upstairs and she installed a stair lift to assist in navigation    Prior Function Level of Independence: Independent with assistive device(s)         Comments: Reports she is using rollater more often however  is able to ambulate home environment without AD     Hand Dominance        Extremity/Trunk Assessment   Upper Extremity Assessment Upper Extremity Assessment: Generalized weakness    Lower Extremity Assessment Lower Extremity Assessment: Generalized weakness    Cervical / Trunk Assessment Cervical / Trunk  Assessment: Normal  Communication   Communication: HOH;Deaf  Cognition Arousal/Alertness: Awake/alert Behavior During Therapy: WFL for tasks assessed/performed Overall Cognitive Status: Within Functional Limits for tasks assessed                                        General Comments      Exercises     Assessment/Plan    PT Assessment Patient needs continued PT services  PT Problem List Decreased strength;Decreased mobility;Decreased balance;Decreased activity tolerance       PT Treatment Interventions Therapeutic exercise;Gait training;Balance training;Stair training;Therapeutic activities;Functional mobility training;Patient/family education    PT Goals (Current goals can be found in the Care Plan section)  Acute Rehab PT Goals Patient Stated Goal: to return home PT Goal Formulation: With patient Time For Goal Achievement: 04/11/20 Potential to Achieve Goals: Good    Frequency Min 2X/week   Barriers to discharge Decreased caregiver support      Co-evaluation               AM-PAC PT "6 Clicks" Mobility  Outcome Measure Help needed turning from your back to your side while in a flat bed without using bedrails?: A Little Help needed moving from lying on your back to sitting on the side of a flat bed without using bedrails?: A Little Help needed moving to and from a bed to a chair (including a wheelchair)?: A Little Help needed standing up from a chair using your arms (e.g., wheelchair or bedside chair)?: A Little Help needed to walk in hospital room?: A Little Help needed climbing 3-5 steps with a railing? : A Little 6 Click Score: 18    End of Session Equipment Utilized During Treatment: Gait belt Activity Tolerance: Patient tolerated treatment well Patient left: in bed;with call bell/phone within reach;with bed alarm set Nurse Communication: Mobility status PT Visit Diagnosis: Unsteadiness on feet (R26.81);Other abnormalities of gait and  mobility (R26.89);Muscle weakness (generalized) (M62.81)    Time: 1450-1530 PT Time Calculation (min) (ACUTE ONLY): 40 min   Charges:   PT Evaluation $PT Eval Moderate Complexity: 1 Mod PT Treatments $Gait Training: 8-22 mins        Kristyn Conetta, PT, GCS 03/28/20,3:49 PM

## 2020-03-28 NOTE — Progress Notes (Signed)
Brief GI note  EGD was normal, she can go home today and follow-up with me in 2-3 weeks.  She will also need referral to outpatient hematology for parenteral iron therapy.  We will set up outpatient capsule study.  Okay to resume anticoagulation from GI standpoint  Cephas Darby, MD [address]  [place], [place] [phone]  Main: [phone]  Fax: [phone] Pager: [phone]

## 2020-03-28 NOTE — Op Note (Signed)
[facility] Gastroenterology Patient Name: [name] [name] Procedure Date: 03/28/2020 12:04 PM MRN: [id number] Account #: [id number] Date of Birth: 1930-01-02 Admit Type: Inpatient Age: 87 Room: [facility] ENDO ROOM 1 Gender: Female Note Status: Finalized Procedure:             Upper GI endoscopy Indications:           Unexplained iron deficiency anemia Providers:             Lin Landsman MD, MD Referring MD:          [name] (Referring MD) Medicines:             General Anesthesia Complications:         No immediate complications. Estimated blood loss: None. Procedure:             Pre-Anesthesia Assessment:                        - Prior to the procedure, a History and Physical was                         performed, and patient medications and allergies were                         reviewed. The patient is competent. The risks and                         benefits of the procedure and the sedation options and                         risks were discussed with the patient. All questions                         were answered and informed consent was obtained.                         Patient identification and proposed procedure were                         verified by the physician, the nurse, the                         anesthesiologist, the anesthetist and the technician                         in the pre-procedure area in the procedure room in the                         endoscopy suite. Mental Status Examination: alert and                         oriented. Airway Examination: normal oropharyngeal                         airway and neck mobility. Respiratory Examination:                         clear to auscultation. CV Examination: normal.  Prophylactic Antibiotics: The patient does not require                         prophylactic antibiotics. Prior Anticoagulants: The                         patient has taken Xarelto  (rivaroxaban), last dose was                         4 days prior to procedure. ASA Grade Assessment: III -                         A patient with severe systemic disease. After                         reviewing the risks and benefits, the patient was                         deemed in satisfactory condition to undergo the                         procedure. The anesthesia plan was to use general                         anesthesia. Immediately prior to administration of                         medications, the patient was re-assessed for adequacy                         to receive sedatives. The heart rate, respiratory                         rate, oxygen saturations, blood pressure, adequacy of                         pulmonary ventilation, and response to care were                         monitored throughout the procedure. The physical                         status of the patient was re-assessed after the                         procedure.                        After obtaining informed consent, the endoscope was                         passed under direct vision. Throughout the procedure,                         the patient's blood pressure, pulse, and oxygen                         saturations were monitored continuously. The Endoscope  was introduced through the mouth, and advanced to the                         second part of duodenum. The upper GI endoscopy was                         accomplished without difficulty. The patient tolerated                         the procedure well. Findings:      The duodenal bulb and second portion of the duodenum were normal.      The entire examined stomach was normal.      The cardia and gastric fundus were normal on retroflexion.      Localized moderate mucosal changes characterized by granularity and       raised mucosa were found in the upper third of the esophagus at 17cm       from incisors, likely inlet patch.  Biopsies were taken with a cold       forceps for histology.      The gastroesophageal junction was normal. Impression:            - Normal duodenal bulb and second portion of the                         duodenum.                        - Normal stomach.                        - Granular mucosa in the esophagus. Biopsied.                        - Normal gastroesophageal junction. Recommendation:        - Await pathology results.                        - Return patient to hospital ward for ongoing care.                        - Advance diet as tolerated today.                        - Continue present medications.                        - Return to GI clinic in 2 weeks.                        - Refer to a hematologist in 2 weeks.                        - To visualize the small bowel, perform video capsule                         endoscopy as outpt. Procedure Code(s):     --- Professional ---                        [phone], Esophagogastroduodenoscopy, flexible,  transoral; with biopsy, single or multiple Diagnosis Code(s):     --- Professional ---                        K22.8, Other specified diseases of esophagus                        D50.9, Iron deficiency anemia, unspecified CPT copyright 2019 American Medical Association. All rights reserved. The codes documented in this report are preliminary and upon coder review may  be revised to meet current compliance requirements. Dr. Ulyess Mort Lin Landsman MD, MD 03/28/2020 12:24:05 PM This report has been signed electronically. Number of Addenda: 0 Note Initiated On: 03/28/2020 12:04 PM Estimated Blood Loss:  Estimated blood loss: none. Estimated blood loss: none.      [facility]

## 2020-03-29 ENCOUNTER — Telehealth: Payer: Self-pay

## 2020-03-29 ENCOUNTER — Inpatient Hospital Stay: Payer: Medicare Other

## 2020-03-29 ENCOUNTER — Other Ambulatory Visit: Payer: Self-pay

## 2020-03-29 DIAGNOSIS — D509 Iron deficiency anemia, unspecified: Secondary | ICD-10-CM

## 2020-03-29 DIAGNOSIS — I5033 Acute on chronic diastolic (congestive) heart failure: Secondary | ICD-10-CM | POA: Diagnosis not present

## 2020-03-29 DIAGNOSIS — D649 Anemia, unspecified: Secondary | ICD-10-CM | POA: Diagnosis not present

## 2020-03-29 DIAGNOSIS — R911 Solitary pulmonary nodule: Secondary | ICD-10-CM | POA: Diagnosis not present

## 2020-03-29 LAB — BASIC METABOLIC PANEL
Anion gap: 11 (ref 5–15)
BUN: 16 mg/dL (ref 8–23)
CO2: 24 mmol/L (ref 22–32)
Calcium: 9 mg/dL (ref 8.9–10.3)
Chloride: 104 mmol/L (ref 98–111)
Creatinine, Ser: 0.72 mg/dL (ref 0.44–1.00)
GFR, Estimated: >60 mL/min (ref >60)
Glucose, Bld: 104 mg/dL — ABNORMAL HIGH (ref 70–99)
Potassium: 4.1 mmol/L (ref 3.5–5.1)
Sodium: 139 mmol/L (ref 135–145)

## 2020-03-29 LAB — BODY FLUID CELL COUNT WITH DIFFERENTIAL
Eos, Fluid: 1 %
Lymphs, Fluid: 70 %
Monocyte-Macrophage-Serous Fluid: 25 %
Neutrophil Count, Fluid: 4 %
Total Nucleated Cell Count, Fluid: 571 cu mm

## 2020-03-29 LAB — SURGICAL PATHOLOGY

## 2020-03-29 LAB — GLUCOSE, CAPILLARY
Glucose-Capillary: 93 mg/dL (ref 70–99)
Glucose-Capillary: 161 mg/dL — ABNORMAL HIGH (ref 70–99)
Glucose-Capillary: 135 mg/dL — ABNORMAL HIGH (ref 70–99)

## 2020-03-29 LAB — CBC
HCT: 31.5 % — ABNORMAL LOW (ref 36.0–46.0)
Hemoglobin: 9.3 g/dL — ABNORMAL LOW (ref 12.0–15.0)
MCH: 24.4 pg — ABNORMAL LOW (ref 26.0–34.0)
MCHC: 29.5 g/dL — ABNORMAL LOW (ref 30.0–36.0)
MCV: 82.7 fL (ref 80.0–100.0)
Platelets: 323 10*3/uL (ref 150–400)
RBC: 3.81 MIL/uL — ABNORMAL LOW (ref 3.87–5.11)
RDW: 23.9 % — ABNORMAL HIGH (ref 11.5–15.5)
WBC: 12.1 10*3/uL — ABNORMAL HIGH (ref 4.0–10.5)
nRBC: 1.3 % — ABNORMAL HIGH (ref 0.0–0.2)

## 2020-03-29 LAB — PROTEIN, PLEURAL OR PERITONEAL FLUID: Total protein, fluid: <3 g/dL

## 2020-03-29 LAB — ALBUMIN, PLEURAL OR PERITONEAL FLUID: Albumin, Fluid: 1.6 g/dL

## 2020-03-29 LAB — MAGNESIUM: Magnesium: 2.4 mg/dL (ref 1.7–2.4)

## 2020-03-29 LAB — PROCALCITONIN: Procalcitonin: 0.24 ng/mL

## 2020-03-29 NOTE — Telephone Encounter (Signed)
Called patient son and scheduled follow up appointment for 04/12/2020. Scheduled capsule study for 04/19/2020. Went over instructions with him and sent to [employer].

## 2020-03-29 NOTE — Telephone Encounter (Signed)
-----   Message from Lin Landsman, MD sent at 03/28/2020 12:39 PM EST ----- Regarding: Hospital follow-up and referral Lattie Haw  I would like to see this patient for clinic visit in 2 weeks, okay to [facility] follow-up, iron deficiency anemia  Caryl Pina, please refer this patient to hematology for severe iron deficiency anemia Also, she will need video capsule endoscopy as outpatient  She will be likely going home today, please call her or her son in couple of days  Thanks RV

## 2020-03-29 NOTE — Telephone Encounter (Signed)
Placed referral to hematology.

## 2020-03-29 NOTE — TOC Initial Note (Signed)
Transition of Care [facility]) - Initial/Assessment Note    Patient Details  Name: HASET OAXACA MRN: [id number] Date of Birth: 22-Jun-1929  Transition of Care [facility]) CM/SW Contact:    Shelbie Hutching, RN Phone Number: 03/29/2020, 3:26 PM  Clinical Narrative:                 Patient admitted to the hospital with symptmatic anemia.  Patient also found to have bilateral pleural effusions and lung nodule.  Patient underwent EDG yesterday with biopsies taken.  Patient went to CT today for thoracentesis of one lung may go tomorrow for the other side.  RNCM met with patient at the bedside, patient is very hard of hearing and her hearing aids are not working.  Patient is from home where she lives alone but she says her son is very involved and has been helping her out.  Son, Percell Miller provides her transportation.  Patient is open with [facility] [place] with [facility] aware of admission and given referral to add RN, OT, and aide to schedule when patient is discharged.   Patient has a rollator and shower chair, patient reports that she has been able to bath herself at home using the shower chair.   Patient is current with her PCP. TOC will cont to follow through discharge.    Expected Discharge Plan: [place] Barriers to Discharge: Continued Medical Work up   Patient Goals and CMS Choice   CMS Medicare.gov Compare Post Acute Care list provided to:: Patient Choice offered to / list presented to : Patient  Expected Discharge Plan and Services Expected Discharge Plan: [place]   Discharge Planning Services: CM Consult Post Acute Care Choice: [place] arrangements for the past 2 months: Single Family Home                 DME Arranged: N/A DME Agency: NA       HH Arranged: RN,PT,OT,Nurse's Aide [place] Agency: [place] ([place]) Date HH Agency Contacted: 03/29/20 Time [place]: 1330 Representative spoke with  at [place]: Corene Cornea  Prior Living Arrangements/Services Living arrangements for the past 2 months: [place] Lives with:: Self Patient language and need for interpreter reviewed:: Yes Do you feel safe going back to the place where you live?: Yes      Need for Family Participation in Patient Care: Yes (Comment) (symptomatic anemia- pleural effusion) Care giver support system in place?: Yes (comment) (son) Current home services: DME (rollator, shower chair) Criminal Activity/Legal Involvement Pertinent to Current Situation/Hospitalization: No - Comment as needed  Activities of Daily Living      Permission Sought/Granted Permission sought to share information with : Case Manager,Family Supports,Other (comment) Permission granted to share information with : Yes, Verbal Permission Granted  Share Information with NAME: Leilani Cespedes  Permission granted to share info w AGENCY: [place] granted to share info w Relationship: son  Permission granted to share info w Contact Information: [phone]  Emotional Assessment Appearance:: Appears stated age Attitude/Demeanor/Rapport: Engaged Affect (typically observed): Accepting Orientation: : Oriented to Self,Oriented to Place,Oriented to  Time,Oriented to Situation Alcohol / Substance Use: Not Applicable Psych Involvement: No (comment)  Admission diagnosis:  Hypokalemia [E87.6] Symptomatic anemia [D64.9] Patient Active Problem List   Diagnosis Date Noted  . Symptomatic anemia 03/25/2020  . HLD (hyperlipidemia) 03/25/2020  . Rapid atrial fibrillation ([place]) 01/11/2020  . Chronic anticoagulation 01/11/2020  .  Chronic ulcer of left leg ([place]) 01/11/2020  . Iron deficiency anemia 01/11/2020  . Hypokalemia 01/01/2020  . Left leg cellulitis 01/01/2020  . Iron deficiency anemia due to chronic blood loss 01/01/2020  . Wound cellulitis 12/31/2019  . Pelvic fracture ([place]) 08/25/2019  . Permanent atrial  fibrillation ([place]) 08/25/2019  . Pain   . Dehydration   . Accidental fall 07/26/2019  . UTI (urinary tract infection) 07/26/2019  . Closed fracture of right inferior pubic ramus ([place]) 07/26/2019  . HTN (hypertension) 07/26/2019  . Ambulatory dysfunction 07/26/2019  . Pubic bone fracture ([place]) 07/26/2019   PCP:  [name], MD Pharmacy:   [facility] [address], [place] - [place] [address] [place] [address] Phone: [phone] Fax: [phone]     Social Determinants of Health (SDOH) Interventions    Readmission Risk Interventions No flowsheet data found.

## 2020-03-29 NOTE — Procedures (Signed)
Ultrasound-guided diagnostic and therapeutic right sided thoracentesis performed yielding 600 mililiters of straw colored fluid. No immediate complications.   Diagnostic fluid was sent to the lab for further analysis. Follow-up chest x-ray pending. EBL is < 2 ml.

## 2020-03-29 NOTE — Progress Notes (Signed)
OT Cancellation Note  Patient Details Name: [name] [name] MRN: [id number] DOB: 1929-12-22   Cancelled Treatment:    Reason Eval/Treat Not Completed: Patient at procedure or test/ unavailable. Consult received, chart reviewed. Pt undergoing thoracentesis, unavailable for OT evaluation. Will re-attempt at later date/time as medically appropriate and available.  Hanley Hays, MPH, MS, OTR/L ascom [phone] 03/29/20, 11:17 AM

## 2020-03-29 NOTE — Progress Notes (Signed)
OT Cancellation Note  Patient Details Name: [name] [name] MRN: [id number] DOB: [birth year]   Cancelled Treatment:    Reason Eval/Treat Not Completed: Other (comment). On 2nd attempt, pt with nurse techs for pt care. Will re-attempt OT evaluation next date as appropriate and available.  Hanley Hays, MPH, MS, OTR/L ascom [phone] 03/29/20, 2:54 PM

## 2020-03-29 NOTE — Progress Notes (Signed)
PROGRESS NOTE    [name] [name]  VHQ:[id number] DOB: 1929/08/02 DOA: 03/25/2020 PCP: [name], MD    Assessment & Plan:   Principal Problem:   Symptomatic anemia Active Problems:   HTN (hypertension)   Permanent atrial fibrillation (HCC)   Hypokalemia   Iron deficiency anemia   HLD (hyperlipidemia)   Symptomatic anemia: w/ fecal occult positive. S/p 1 unit of pRBCs transfused. Continue on PPI. Continue to hold xarelto. EGD showed granular mucosa in the esophagus which was biopsied   B/l pleural effusions: large. S/p right thoracentesis w/ 600 mL of straw colored fluid removed, fluid studies pending. Left thoracentesis tomorrow. Pro-cal 0.24  IDA: completed IV iron x 3 days. Continue on po iron supplements   Left lung nodule: 4.8 mm on CXR & CT chest showed 5 mm peripheral left upper lobe nodule and rads recs f/u CT in 12 months   Acute hypoxic respiratory failure: weaned off of supplemental oxygen today   Acute on chronic diastolic CHF exacerbation: continue on IV lasix. Strict I/Os.   HTN: continue to hold home dose of CCB     Permanent atrial fibrillation: continue on amiodarone. Continue to hold xarelto for L thoracentesis tomorrow   Leukocytosis: likely reactive. Will continue to monitor   Hypokalemia: within normal limits  HLD: continue on statin    DVT prophylaxis: SCDs Code Status: partial  Family Communication: discussed pt's care w/ pt's son, [name] [name] and answered his questions Disposition Plan: depends on PT/OT recs   Level of care: Med-Surg   Consultants:   GI   Procedures:    Antimicrobials:   Status is: Inpatient  Remains inpatient appropriate because:Ongoing diagnostic testing needed not appropriate for outpatient work up, IV treatments appropriate due to intensity of illness or inability to take PO and Inpatient level of care appropriate due to severity of illness   Dispo: The patient is from: Home               Anticipated d/c is to: Home              Patient currently is not medically stable to d/c.   Difficult to place patient No      Subjective: Pt c/o fatigue  Objective: Vitals:   03/29/20 0020 03/29/20 0406 03/29/20 0504 03/29/20 0533  BP: 126/79 (!) 153/68 (!) 152/110   Pulse: 84 85 83   Resp: 16 16 15    Temp: (!) 97.5 F (36.4 C) 98.9 F (37.2 C) 98 F (36.7 C)   TempSrc: Oral Oral Oral   SpO2: 94% 95% 92%   Weight:    53.7 kg  Height:        Intake/Output Summary (Last 24 hours) at 03/29/2020 0725 Last data filed at 03/28/2020 1500 Gross per 24 hour  Intake 10.06 ml  Output --  Net 10.06 ml   Filed Weights   03/25/20 1539 03/28/20 0500 03/29/20 0533  Weight: 54.2 kg 54.1 kg 53.7 kg    Examination:  General exam: Appears calm & comfortable  Respiratory system: decreased breath sounds b/l  Cardiovascular system: S/1S2 +. No clicks or rubs Gastrointestinal system: Abd is soft, NT, ND & hypoactive bowel sounds Central nervous system: Alert and oriented. Moves all 4 extremities  Psychiatry: Judgement and insight appear normal. Flat mood and affect      Data Reviewed: I have personally reviewed following labs and imaging studies  CBC: Recent Labs  Lab 03/25/20 1133 03/26/20 0215 03/26/20 0710 03/26/20 1316 03/27/20 0510 03/28/20  0604 03/29/20 0542  WBC 10.2   < > 11.1* 16.4* 12.1* 12.6* 12.1*  NEUTROABS 6.8  --   --   --   --   --   --   HGB 6.5*   < > 7.7* 9.2* 7.8* 9.4* 9.3*  HCT 22.0*   < > 25.1* 31.1* 26.0* 31.4* 31.5*  MCV 77.5*   < > 79.7* 81.6 80.0 81.3 82.7  PLT 326   < > 288 366 298 332 323   < > = values in this interval not displayed.   Basic Metabolic Panel: Recent Labs  Lab 03/25/20 1133 03/26/20 0215 03/27/20 0510 03/28/20 0604 03/29/20 0542  NA 141 144 140 140 139  K 2.3* 3.8 3.2* 4.1 4.1  CL 101 109 105 105 104  CO2 29 27 26 27 24   GLUCOSE 147* 92 83 87 104*  BUN 14 12 13 13 16   CREATININE 0.87 0.74 0.75 0.73 0.72  CALCIUM  8.7* 8.4* 8.6* 9.1 9.0  MG 2.0  --  2.2 2.3 2.4   GFR: Estimated Creatinine Clearance: 39.4 mL/min (by C-G formula based on SCr of 0.72 mg/dL). Liver Function Tests: Recent Labs  Lab 03/25/20 1133  AST 24  ALT 16  ALKPHOS 67  BILITOT 0.7  PROT 6.8  ALBUMIN 3.3*   No results for input(s): LIPASE, AMYLASE in the last 168 hours. No results for input(s): AMMONIA in the last 168 hours. Coagulation Profile: Recent Labs  Lab 03/25/20 1133  INR 2.2*   Cardiac Enzymes: No results for input(s): CKTOTAL, CKMB, CKMBINDEX, TROPONINI in the last 168 hours. BNP (last 3 results) No results for input(s): PROBNP in the last 8760 hours. HbA1C: No results for input(s): HGBA1C in the last 72 hours. CBG: Recent Labs  Lab 03/27/20 0818 03/28/20 0824  GLUCAP 91 97   Lipid Profile: No results for input(s): CHOL, HDL, LDLCALC, TRIG, CHOLHDL, LDLDIRECT in the last 72 hours. Thyroid Function Tests: No results for input(s): TSH, T4TOTAL, FREET4, T3FREE, THYROIDAB in the last 72 hours. Anemia Panel: No results for input(s): VITAMINB12, FOLATE, FERRITIN, TIBC, IRON, RETICCTPCT in the last 72 hours. Sepsis Labs: No results for input(s): PROCALCITON, LATICACIDVEN in the last 168 hours.  Recent Results (from the past 240 hour(s))  Resp Panel by RT-PCR (Flu A&B, Covid) Nasopharyngeal Swab     Status: None   Collection Time: 03/25/20 11:33 AM   Specimen: Nasopharyngeal Swab; Nasopharyngeal(NP) swabs in vial transport medium  Result Value Ref Range Status   SARS Coronavirus 2 by RT PCR NEGATIVE NEGATIVE Final    Comment: (NOTE) SARS-CoV-2 target nucleic acids are NOT DETECTED.  The SARS-CoV-2 RNA is generally detectable in upper respiratory specimens during the acute phase of infection. The lowest concentration of SARS-CoV-2 viral copies this assay can detect is 138 copies/mL. A negative result does not preclude SARS-Cov-2 infection and should not be used as the sole basis for treatment  or other patient management decisions. A negative result may occur with  improper specimen collection/handling, submission of specimen other than nasopharyngeal swab, presence of viral mutation(s) within the areas targeted by this assay, and inadequate number of viral copies(<138 copies/mL). A negative result must be combined with clinical observations, patient history, and epidemiological information. The expected result is Negative.  Fact Sheet for Patients:  EntrepreneurPulse.com.au  Fact Sheet for Healthcare Providers:  IncredibleEmployment.be  This test is no t yet approved or cleared by the [place] FDA and  has been authorized for detection and/or diagnosis of  SARS-CoV-2 by FDA under an Emergency Use Authorization (EUA). This EUA will remain  in effect (meaning this test can be used) for the duration of the COVID-19 declaration under Section 564(b)(1) of the Act, 21 U.S.C.section 360bbb-3(b)(1), unless the authorization is terminated  or revoked sooner.       Influenza A by PCR NEGATIVE NEGATIVE Final   Influenza B by PCR NEGATIVE NEGATIVE Final    Comment: (NOTE) The Xpert Xpress SARS-CoV-2/FLU/RSV plus assay is intended as an aid in the diagnosis of influenza from Nasopharyngeal swab specimens and should not be used as a sole basis for treatment. Nasal washings and aspirates are unacceptable for Xpert Xpress SARS-CoV-2/FLU/RSV testing.  Fact Sheet for Patients: EntrepreneurPulse.com.au  Fact Sheet for Healthcare Providers: IncredibleEmployment.be  This test is not yet approved or cleared by the [place] FDA and has been authorized for detection and/or diagnosis of SARS-CoV-2 by FDA under an Emergency Use Authorization (EUA). This EUA will remain in effect (meaning this test can be used) for the duration of the COVID-19 declaration under Section 564(b)(1) of the Act, 21 U.S.C. section  360bbb-3(b)(1), unless the authorization is terminated or revoked.  Performed at [facility] [address] [place], [place] [address]   MRSA PCR Screening     Status: Abnormal   Collection Time: 03/28/20  6:18 PM  Result Value Ref Range Status   MRSA by PCR POSITIVE (A) NEGATIVE Final    Comment:        The GeneXpert MRSA Assay (FDA approved for NASAL specimens only), is one component of a comprehensive MRSA colonization surveillance program. It is not intended to diagnose MRSA infection nor to guide or monitor treatment for MRSA infections. RESULT CALLED TO, READ BACK BY AND VERIFIED WITH: Earlie Server 03/28/20 AT 1928 BY ACR Performed at [facility] [address] [place], [place] [address]          Radiology Studies: CT CHEST W CONTRAST  Result Date: 03/28/2020 CLINICAL DATA:  Lung nodule. EXAM: CT CHEST WITH CONTRAST TECHNIQUE: Multidetector CT imaging of the chest was performed during intravenous contrast administration. CONTRAST:  48mL OMNIPAQUE IOHEXOL 300 MG/ML  SOLN COMPARISON:  Chest radiograph 03/28/2020. FINDINGS: Cardiovascular: Atherosclerotic calcification of the aorta, aortic valve and coronary arteries. Heart is enlarged. No pericardial effusion. Mediastinum/Nodes: Thyroid is heterogeneous and contains low-attenuation nodules measuring up to 7 mm. No follow-up recommended (ref: J Am Coll Radiol. 2015 Feb;12(2): 143-50).No pathologically enlarged mediastinal, hilar or axillary lymph nodes. Esophagus is grossly unremarkable. Lungs/Pleura: Image quality is degraded by respiratory motion. Centrilobular emphysema. Large bilateral pleural effusions with collapse/consolidation in the adjacent lower lobes. 5 mm peripheral left upper lobe nodule (3/39). Airway is unremarkable. Upper Abdomen: Low-attenuation lesions in the liver measure up to 2.2 cm and are likely cysts. Liver may be slightly decreased in attenuation diffusely and the margin is  slightly irregular. Visualized portions of the liver, gallbladder and right adrenal gland are otherwise unremarkable. Nodular thickening of the left adrenal gland. Subcentimeter low-attenuation lesions in the kidneys are too small to characterize but statistically, cysts are likely. Visualized portions of the spleen, pancreas, stomach and bowel are unremarkable with the exception of a small hiatal hernia. No upper abdominal adenopathy. Musculoskeletal: Degenerative changes in the spine. No worrisome lytic or sclerotic lesions. Old rib fractures. IMPRESSION: 1. Large bilateral pleural effusions with collapse/consolidation in both lower lobes, difficult to exclude pneumonia. 2. 5 mm peripheral left upper lobe nodule. No follow-up needed if patient is low-risk. Non-contrast  chest CT can be considered in 12 months if patient is high-risk and aching in the consideration patient's age. This recommendation follows the consensus statement: Guidelines for Management of Incidental Pulmonary Nodules Detected on CT Images: From the Fleischner Society 2017; Radiology 2017; 284:228-243. 3. Hepatic steatosis and probable cirrhosis. 4. Aortic atherosclerosis (ICD10-I70.0). Coronary artery calcification. 5.  Emphysema (ICD10-J43.9). Electronically Signed   By: Lorin Picket M.D.   On: 03/28/2020 15:23   DG CHEST PORT 1 VIEW  Result Date: 03/28/2020 CLINICAL DATA:  Pulmonary edema EXAM: PORTABLE CHEST 1 VIEW COMPARISON:  03/26/2020 FINDINGS: Heart size within normal limits. Mild pulmonary vascular congestion has improved since prior examination. Interval improvement of bilateral pleural effusions and pulmonary edema. 4.8 mm nodule noted in the left lateral upper lung projecting just below the anterior segment of the left second rib. IMPRESSION: 1. Improving pulmonary edema. 2. 4.8 mm left upper lung nodule should be further evaluated with nonemergent contrast enhanced chest CT when patient condition allows. 3. These results  will be called to the ordering clinician or representative by the Radiologist Assistant, and communication documented in the PACS or [employer]. Electronically Signed   By: Miachel Roux M.D.   On: 03/28/2020 08:07   ECHOCARDIOGRAM COMPLETE  Result Date: 03/27/2020    ECHOCARDIOGRAM REPORT   Patient Name:   YEILA MORRO Date of Exam: 03/27/2020 Medical Rec #:  [id number]           Height:       63.0 in Accession #:    [id number]          Weight:       119.5 lb Date of Birth:  [birth year]           BSA:          1.553 m Patient Age:    48 years            BP:           124/66 mmHg Patient Gender: F                   HR:           80 bpm. Exam Location:  [facility] Procedure: 2D Echo Indications:     Acute Respiratory Distress R06.03  History:         Patient has no prior history of Echocardiogram examinations.  Sonographer:     Arville Go RDCS Referring Phys:  [id number] [place] Diagnosing Phys: [name] MD IMPRESSIONS  1. Left ventricular ejection fraction, by estimation, is 60 to 65%. The left ventricle has normal function. The left ventricle demonstrates regional wall motion abnormalities (see scoring diagram/findings for description). There is mild left ventricular  hypertrophy. Left ventricular diastolic parameters are consistent with Grade II diastolic dysfunction (pseudonormalization).  2. Right ventricular systolic function is normal. The right ventricular size is normal.  3. Moderate pleural effusion in the left lateral region.  4. The mitral valve is myxomatous. Moderate to severe mitral valve regurgitation. Moderate mitral annular calcification.  5. The aortic valve is normal in structure. Aortic valve regurgitation is trivial. FINDINGS  Left Ventricle: Left ventricular ejection fraction, by estimation, is 60 to 65%. The left ventricle has normal function. The left ventricle demonstrates regional wall motion abnormalities. The left ventricular internal cavity size was normal in size.  There is mild left ventricular hypertrophy. Left ventricular diastolic parameters are consistent with Grade II diastolic dysfunction (pseudonormalization). Right Ventricle: The right ventricular size  is normal. No increase in right ventricular wall thickness. Right ventricular systolic function is normal. Left Atrium: Left atrial size was normal in size. Right Atrium: Right atrial size was normal in size. Pericardium: There is no evidence of pericardial effusion. Mitral Valve: The mitral valve is myxomatous. There is severe thickening of the anterior mitral valve leaflet(s). There is severe calcification of the anterior mitral valve leaflet(s). Normal mobility of the mitral valve leaflets. Moderate mitral annular  calcification. Moderate to severe mitral valve regurgitation. Tricuspid Valve: The tricuspid valve is normal in structure. Tricuspid valve regurgitation is mild. Aortic Valve: The aortic valve is normal in structure. Aortic valve regurgitation is trivial. Aortic regurgitation PHT measures 423 msec. Aortic valve peak gradient measures 8.9 mmHg. Pulmonic Valve: The pulmonic valve was grossly normal. Pulmonic valve regurgitation is not visualized. Aorta: The ascending aorta was not well visualized. IAS/Shunts: No atrial level shunt detected by color flow Doppler. Additional Comments: There is a moderate pleural effusion in the left lateral region.  LEFT VENTRICLE PLAX 2D LVIDd:         4.75 cm  Diastology LVIDs:         3.18 cm  LV e' medial:   5.44 cm/s LV PW:         0.94 cm  LV E/e' medial: 17.8 LV IVS:        0.94 cm LVOT diam:     1.80 cm LV SV:         48 LV SV Index:   31 LVOT Area:     2.54 cm  RIGHT VENTRICLE RV Basal diam:  2.19 cm RV S prime:     13.50 cm/s TAPSE (M-mode): 2.0 cm LEFT ATRIUM             Index       RIGHT ATRIUM           Index LA diam:        3.10 cm 2.00 cm/m  RA Area:     13.50 cm LA Vol (A2C):   72.1 ml 46.41 ml/m RA Volume:   32.70 ml  21.05 ml/m LA Vol (A4C):   50.8 ml  32.70 ml/m LA Biplane Vol: 61.0 ml 39.27 ml/m  AORTIC VALVE                PULMONIC VALVE AV Area (Vmax): 1.47 cm    PV Vmax:       1.09 m/s AV Vmax:        149.00 cm/s PV Peak grad:  4.8 mmHg AV Peak Grad:   8.9 mmHg LVOT Vmax:      86.20 cm/s LVOT Vmean:     50.600 cm/s LVOT VTI:       0.188 m AI PHT:         423 msec  AORTA Ao Root diam: 2.80 cm Ao Asc diam:  2.80 cm MITRAL VALVE                TRICUSPID VALVE MV Area (PHT): 4.71 cm     TV Peak grad:   35.8 mmHg MV Decel Time: 161 msec     TV Vmax:        2.99 m/s MV E velocity: 96.80 cm/s MV A velocity: 106.00 cm/s  SHUNTS MV E/A ratio:  0.91         Systemic VTI:  0.19 m  Systemic Diam: 1.80 cm [name] MD Electronically signed by [name] MD Signature Date/Time: 03/27/2020/3:06:22 PM    Final         Scheduled Meds: . amiodarone  200 mg Oral Daily  . B-complex with vitamin C  1 tablet Oral Daily  . Chlorhexidine Gluconate Cloth  6 each Topical Q0600  . cholecalciferol  1,000 Units Oral Daily  . furosemide  40 mg Intravenous Daily  . iron polysaccharides  150 mg Oral Daily  . melatonin  5 mg Oral QHS  . mupirocin ointment  1 application Nasal BID  . pantoprazole (PROTONIX) IV  40 mg Intravenous Q12H  . polyethylene glycol  34 g Oral BID  . pravastatin  40 mg Oral q1800   Continuous Infusions: . sodium chloride Stopped (03/28/20 2109)     LOS: 3 days    Time spent: 32 mins     Wyvonnia Dusky, MD [facility] Pager [phone]  If 7PM-7AM, please contact night-coverage 03/29/2020, 7:25 AM

## 2020-03-30 ENCOUNTER — Inpatient Hospital Stay: Payer: Medicare Other

## 2020-03-30 DIAGNOSIS — J90 Pleural effusion, not elsewhere classified: Secondary | ICD-10-CM | POA: Diagnosis not present

## 2020-03-30 DIAGNOSIS — R911 Solitary pulmonary nodule: Secondary | ICD-10-CM | POA: Diagnosis not present

## 2020-03-30 DIAGNOSIS — I5033 Acute on chronic diastolic (congestive) heart failure: Secondary | ICD-10-CM | POA: Diagnosis not present

## 2020-03-30 LAB — BODY FLUID CELL COUNT WITH DIFFERENTIAL
Eos, Fluid: 2 %
Lymphs, Fluid: 73 %
Monocyte-Macrophage-Serous Fluid: 14 %
Neutrophil Count, Fluid: 9 %
Total Nucleated Cell Count, Fluid: 387 cu mm

## 2020-03-30 LAB — CBC
HCT: 31.6 % — ABNORMAL LOW (ref 36.0–46.0)
Hemoglobin: 9.1 g/dL — ABNORMAL LOW (ref 12.0–15.0)
MCH: 24.3 pg — ABNORMAL LOW (ref 26.0–34.0)
MCHC: 28.8 g/dL — ABNORMAL LOW (ref 30.0–36.0)
MCV: 84.3 fL (ref 80.0–100.0)
Platelets: 326 10*3/uL (ref 150–400)
RBC: 3.75 MIL/uL — ABNORMAL LOW (ref 3.87–5.11)
RDW: 25.4 % — ABNORMAL HIGH (ref 11.5–15.5)
WBC: 13.1 10*3/uL — ABNORMAL HIGH (ref 4.0–10.5)
nRBC: 0.5 % — ABNORMAL HIGH (ref 0.0–0.2)

## 2020-03-30 LAB — BASIC METABOLIC PANEL
Anion gap: 8 (ref 5–15)
BUN: 15 mg/dL (ref 8–23)
CO2: 29 mmol/L (ref 22–32)
Calcium: 9.1 mg/dL (ref 8.9–10.3)
Chloride: 103 mmol/L (ref 98–111)
Creatinine, Ser: 0.71 mg/dL (ref 0.44–1.00)
GFR, Estimated: >60 mL/min (ref >60)
Glucose, Bld: 105 mg/dL — ABNORMAL HIGH (ref 70–99)
Potassium: 5 mmol/L (ref 3.5–5.1)
Sodium: 140 mmol/L (ref 135–145)

## 2020-03-30 LAB — GLUCOSE, CAPILLARY
Glucose-Capillary: 102 mg/dL — ABNORMAL HIGH (ref 70–99)
Glucose-Capillary: 211 mg/dL — ABNORMAL HIGH (ref 70–99)

## 2020-03-30 LAB — MAGNESIUM: Magnesium: 2.5 mg/dL — ABNORMAL HIGH (ref 1.7–2.4)

## 2020-03-30 LAB — ACID FAST SMEAR (AFB, MYCOBACTERIA): Acid Fast Smear: NEGATIVE

## 2020-03-30 LAB — PROTEIN, BODY FLUID (OTHER): Total Protein, Body Fluid Other: 2.6 g/dL

## 2020-03-30 LAB — CYTOLOGY - NON PAP

## 2020-03-30 MED ORDER — TRAMADOL HCL 50 MG PO TABS
50.0000 mg | ORAL_TABLET | Freq: Four times a day (QID) | ORAL | Status: DC | PRN
  Administered 2020-03-30: 22:00:00 50 mg via ORAL
  Filled 2020-03-30 (×2): qty 1

## 2020-03-30 MED ORDER — VERAPAMIL HCL ER 180 MG PO TBCR
180.0000 mg | EXTENDED_RELEASE_TABLET | Freq: Every day | ORAL | Status: DC
  Administered 2020-03-30 – 2020-03-31 (×2): 180 mg via ORAL
  Filled 2020-03-30 (×4): qty 1

## 2020-03-30 MED ORDER — MORPHINE SULFATE (PF) 2 MG/ML IV SOLN
1.0000 mg | INTRAVENOUS | Status: DC | PRN
  Administered 2020-03-30: 1 mg via INTRAVENOUS
  Filled 2020-03-30 (×2): qty 1

## 2020-03-30 NOTE — Progress Notes (Signed)
PT Cancellation Note  Patient Details Name: DORA SIMEONE MRN: [id number] DOB: 08/15/1929   Cancelled Treatment:    Reason Eval/Treat Not Completed: Patient at procedure or test/unavailable.  Chart reviewed for pt.  Upon arrival to room, pt was not in room.  Nursing stated that pt was with ultrasound.  Will check at later time to see pt.   Gwenlyn Saran, PT, DPT 03/30/20, 11:56 AM

## 2020-03-30 NOTE — Procedures (Signed)
Interventional Radiology Procedure Note  Procedure: Thoracentesis  Indication: Left pleural effusion  Findings: Please refer to procedural dictation for full description.  Complications: None  EBL: < 10 mL  Miachel Roux, MD [phone]

## 2020-03-30 NOTE — Evaluation (Signed)
Occupational Therapy Evaluation Patient Details Name: [name] [name] MRN: [id number] DOB: 1929/03/31 Today's Date: 03/30/2020    History of Present Illness [name] [name] is a 87 y.o. female with medical history significant of atrial fibrillation on Xarelto, hypertension, hyperlipidemia, hemorrhoids, hearing loss, diverticulosis, iron deficiency anemia, Bell's palsy, anxiety, who presents with fatigue and abnormal lab.     Patient states that she has been having generalized weakness and fatigue for almost 4 weeks, which has progressively worsened in the past several days.  Patient was seen by PCP yesterday and found to have hemoglobin 6.7 and potassium 2.7.  Patient denies rectal bleeding or dark stool.   Clinical Impression   Ms Done was seen for OT evaluation this date. Prior to hospital admission, pt was MOD I for mobility and ADLs. Pt lives alone with son available PRN. Pt presents to acute OT demonstrating impaired ADL performance and functional mobility 2/2 decreased safety awareness, functional strength/balance deficits, and poor insight into deficits. Pt currently requires CGA + 4WW for toilet t/f - cues t/o for safety. SBA perihygiene with lateral leans. CGA + 4WW handwashing standing sinkside. SUPERVISION don B socks seated EOB - assist for sitting balance, moderate lateral LOBs.   Pt reports L ankle pain, MD in room at end of session notified. Pt would benefit from skilled OT to address noted impairments and functional limitations (see below for any additional details) in order to maximize safety and independence while minimizing falls risk and caregiver burden. Upon hospital discharge, recommend HHOT to maximize pt safety and return to functional independence during meaningful occupations of daily life.     Follow Up Recommendations  Home health OT (initial 24/7 support, decreasing to intermittent)    Equipment Recommendations  3 in 1 bedside commode    Recommendations  for Other Services       Precautions / Restrictions Precautions Precautions: Fall Restrictions Weight Bearing Restrictions: No      Mobility Bed Mobility Overal bed mobility: Needs Assistance Bed Mobility: Supine to Sit     Supine to sit: Min assist          Transfers Overall transfer level: Needs assistance Equipment used: 4-wheeled walker Transfers: Sit to/from Stand Sit to Stand: Min guard         General transfer comment: VCs to use 4WW brakes    Balance Overall balance assessment: Needs assistance Sitting-balance support: Feet supported Sitting balance-Leahy Scale: Good     Standing balance support: No upper extremity supported;During functional activity Standing balance-Leahy Scale: Fair Standing balance comment: reliant on RW and min guard for safety                           ADL either performed or assessed with clinical judgement   ADL Overall ADL's : Needs assistance/impaired                                       General ADL Comments: CGA + 4WW for toilet t/f - cues t/o for safety. SBA perihygiene with lateral leans. CGA + 4WW handwashing standing sinkside. SUPERVISION don B socks seated EOB - assist for sitting balance, moderate lateral LOBs                  Pertinent Vitals/Pain Pain Assessment: Faces Faces Pain Scale: Hurts little more Pain Location: L ankle Pain Descriptors / Indicators:  Discomfort;Dull;Grimacing Pain Intervention(s): Limited activity within patient's tolerance;Repositioned     Hand Dominance Right   Extremity/Trunk Assessment Upper Extremity Assessment Upper Extremity Assessment: Generalized weakness   Lower Extremity Assessment Lower Extremity Assessment: Generalized weakness   Cervical / Trunk Assessment Cervical / Trunk Assessment: Normal   Communication Communication Communication: HOH;Deaf   Cognition Arousal/Alertness: Awake/alert Behavior During Therapy: WFL for tasks  assessed/performed Overall Cognitive Status: Within Functional Limits for tasks assessed                                 General Comments: Poor safety awareness   General Comments  brusing noted to L ankle, MD in room at end and aware c/o ankle pain    Exercises Exercises: Other exercises Other Exercises Other Exercises: Pt educated re: OT role, DME recs, d/c recs, falls prevention, ECS Other Exercises: LBD, toileting, self-feeding, sup>sit, sit<>stand, sitting/standing balance/tolerance   Shoulder Instructions      Home Living Family/patient expects to be discharged to:: Private residence Living Arrangements: Alone Available Help at Discharge: Family;Available PRN/intermittently Type of Home: House Home Access: Stairs to enter [employer] of Steps: 4 Entrance Stairs-Rails: Can reach both Home Layout: Multi-level;1/2 bath on main level;Bed/bath upstairs Alternate Level Stairs-Number of Steps: flight Alternate Level Stairs-Rails: Can reach both Bathroom Shower/Tub: Walk-in shower;Tub/shower unit   Bathroom Toilet: Standard Bathroom Accessibility: Yes   Home Equipment: Walker - 2 wheels;Walker - 4 wheels;Cane - single point   Additional Comments: pt reports her bed is upstairs and she installed a stair lift to assist in navigation      Prior Functioning/Environment Level of Independence: Independent with assistive device(s)        Comments: Reports she is using rollater more often however is able to ambulate home environment without AD        OT Problem List: Decreased strength;Decreased activity tolerance;Impaired balance (sitting and/or standing);Decreased safety awareness      OT Treatment/Interventions: Self-care/ADL training;Therapeutic exercise;DME and/or AE instruction;Energy conservation;Therapeutic activities;Patient/family education;Balance training    OT Goals(Current goals can be found in the care plan section) Acute Rehab OT  Goals Patient Stated Goal: to return home OT Goal Formulation: With patient Time For Goal Achievement: 04/13/20 Potential to Achieve Goals: Good ADL Goals Pt Will Perform Lower Body Dressing: with modified independence;sit to/from stand (c LRAD PRN) Pt Will Transfer to Toilet: with modified independence;ambulating;regular height toilet (c LRAD PRN) Additional ADL Goal #1: Pt will Independently verbalize x3 falls prevention strategies.  OT Frequency: Min 2X/week    AM-PAC OT "6 Clicks" Daily Activity     Outcome Measure Help from another person eating meals?: None Help from another person taking care of personal grooming?: A Little Help from another person toileting, which includes using toliet, bedpan, or urinal?: A Little Help from another person bathing (including washing, rinsing, drying)?: A Little Help from another person to put on and taking off regular upper body clothing?: A Little Help from another person to put on and taking off regular lower body clothing?: A Little 6 Click Score: 19   End of Session Equipment Utilized During Treatment: Other (comment) (7FI) Nurse Communication: Mobility status  Activity Tolerance: Patient tolerated treatment well Patient left: in chair;with call bell/phone within reach;with chair alarm set;Other (comment) (MD in room)  OT Visit Diagnosis: Other abnormalities of gait and mobility (R26.89);Muscle weakness (generalized) (M62.81)  Time: 5859-2924 OT Time Calculation (min): 42 min Charges:  OT General Charges $OT Visit: 1 Visit OT Evaluation $OT Eval Low Complexity: 1 Low OT Treatments $Self Care/Home Management : 38-52 mins  Dessie Coma, M.S. OTR/L  03/30/20, 10:42 AM  ascom [phone]

## 2020-03-30 NOTE — Progress Notes (Signed)
Patient came back from having Thoracentesis and started c/o left chest pain under left breast. Brought Tylenol in to give her and she refused she said her pain was bad a 10/10. Messaged MD about same, new order for EKG, Morphine and Ultram. VSS. Gave patient Morphine and patient started feeling relief.

## 2020-03-30 NOTE — Progress Notes (Addendum)
PT Cancellation Note  Patient Details Name: [name] [name] MRN: [id number] DOB: September 19, 1929   Cancelled Treatment:    Reason Eval/Treat Not Completed: Patient at procedure or test/unavailable.  Pt is still currently with ultrasound for thoracentesis per nursing staff.  Will attempt when medically appropriate at later date/time.   Gwenlyn Saran, PT, DPT 03/30/20, 3:11 PM

## 2020-03-30 NOTE — Progress Notes (Signed)
PROGRESS NOTE    [name] [name]  JOA:[id number] DOB: [birth year] DOA: 03/25/2020 PCP: [name], MD    Assessment & Plan:   Principal Problem:   Symptomatic anemia Active Problems:   HTN (hypertension)   Permanent atrial fibrillation (HCC)   Hypokalemia   Iron deficiency anemia   HLD (hyperlipidemia)   Symptomatic anemia: w/ fecal occult positive. S/p 1 unit of pRBCs transfused. Continue on PPI. Continue to hold xarelto. EGD showed granular mucosa in the esophagus which was biopsied. Pathology shows neg for goblet cells, dysplasia & malignancy   B/l pleural effusions: large. S/p right thoracentesis w/ 600 mL of straw colored fluid removed. Right pleural fluid cx is NGTD & cytology is pending still. Will go for left thoracentesis today. Pro-cal 0.24  IDA: completed IV iron x 3 days. Continue on po iron supplements   Left lung nodule: 4.8 mm on CXR & CT chest showed 5 mm peripheral left upper lobe nodule and rads recs f/u CT in 12 months   Acute hypoxic respiratory failure: resolved  Acute on chronic diastolic CHF exacerbation: continue on IV lasix. Monitor I/Os.   HTN: will restart home dose of veramapil    Permanent atrial fibrillation: continue on amiodarone. Continue to hold xarelto for L thoracentesis today   Leukocytosis: likely reactive. Will continue to monitor   Hypokalemia: WNL today   HLD: continue on statin    DVT prophylaxis: SCDs Code Status: partial  Family Communication: discussed pt's care w/ pt's son, Percell Miller and answered his questions Disposition Plan: likely d/c home w/ home health   Level of care: Med-Surg   Consultants:   GI   Procedures:    Antimicrobials:   Status is: Inpatient  Remains inpatient appropriate because:Ongoing diagnostic testing needed not appropriate for outpatient work up, IV treatments appropriate due to intensity of illness or inability to take PO and Inpatient level of care appropriate due to  severity of illness   Dispo: The patient is from: Home              Anticipated d/c is to: Home              Patient currently is not medically stable to d/c.   Difficult to place patient No      Subjective: Pt c/o difficulty hearing and fatigue   Objective: Vitals:   03/29/20 1920 03/30/20 0020 03/30/20 0401 03/30/20 0500  BP: (!) 117/59 130/62 132/72   Pulse: 90 83 84   Resp: 20 18 15    Temp: 99.1 F (37.3 C) 98 F (36.7 C) 98.9 F (37.2 C)   TempSrc:  Oral    SpO2: 95% 95% 94%   Weight:    49.7 kg  Height:        Intake/Output Summary (Last 24 hours) at 03/30/2020 0734 Last data filed at 03/29/2020 1930 Gross per 24 hour  Intake 360 ml  Output 350 ml  Net 10 ml   Filed Weights   03/28/20 0500 03/29/20 0533 03/30/20 0500  Weight: 54.1 kg 53.7 kg 49.7 kg    Examination:  General exam: Appears comfortable  Respiratory system: diminished breath sounds b/l  Cardiovascular system: S1 & S2+. No rubs or gallops Gastrointestinal system: Abd is soft, NT, ND & normal bowel sounds Central nervous system: Aler and oriented. Moves all 4 extremities  Psychiatry: Judgement and insight appear normal. Flat mood and affect      Data Reviewed: I have personally reviewed following labs and imaging studies  CBC: Recent Labs  Lab 03/25/20 1133 03/26/20 0215 03/26/20 1316 03/27/20 0510 03/28/20 0604 03/29/20 0542 03/30/20 0619  WBC 10.2   < > 16.4* 12.1* 12.6* 12.1* 13.1*  NEUTROABS 6.8  --   --   --   --   --   --   HGB 6.5*   < > 9.2* 7.8* 9.4* 9.3* 9.1*  HCT 22.0*   < > 31.1* 26.0* 31.4* 31.5* 31.6*  MCV 77.5*   < > 81.6 80.0 81.3 82.7 84.3  PLT 326   < > 366 298 332 323 326   < > = values in this interval not displayed.   Basic Metabolic Panel: Recent Labs  Lab 03/25/20 1133 03/26/20 0215 03/27/20 0510 03/28/20 0604 03/29/20 0542 03/30/20 0619  NA 141 144 140 140 139 140  K 2.3* 3.8 3.2* 4.1 4.1 5.0  CL 101 109 105 105 104 103  CO2 29 27 26 27 24 29    GLUCOSE 147* 92 83 87 104* 105*  BUN 14 12 13 13 16 15   CREATININE 0.87 0.74 0.75 0.73 0.72 0.71  CALCIUM 8.7* 8.4* 8.6* 9.1 9.0 9.1  MG 2.0  --  2.2 2.3 2.4 2.5*   GFR: Estimated Creatinine Clearance: 37.4 mL/min (by C-G formula based on SCr of 0.71 mg/dL). Liver Function Tests: Recent Labs  Lab 03/25/20 1133  AST 24  ALT 16  ALKPHOS 67  BILITOT 0.7  PROT 6.8  ALBUMIN 3.3*   No results for input(s): LIPASE, AMYLASE in the last 168 hours. No results for input(s): AMMONIA in the last 168 hours. Coagulation Profile: Recent Labs  Lab 03/25/20 1133  INR 2.2*   Cardiac Enzymes: No results for input(s): CKTOTAL, CKMB, CKMBINDEX, TROPONINI in the last 168 hours. BNP (last 3 results) No results for input(s): PROBNP in the last 8760 hours. HbA1C: No results for input(s): HGBA1C in the last 72 hours. CBG: Recent Labs  Lab 03/27/20 0818 03/28/20 0824 03/29/20 0809 03/29/20 1218 03/29/20 1643  GLUCAP 91 97 93 161* 135*   Lipid Profile: No results for input(s): CHOL, HDL, LDLCALC, TRIG, CHOLHDL, LDLDIRECT in the last 72 hours. Thyroid Function Tests: No results for input(s): TSH, T4TOTAL, FREET4, T3FREE, THYROIDAB in the last 72 hours. Anemia Panel: No results for input(s): VITAMINB12, FOLATE, FERRITIN, TIBC, IRON, RETICCTPCT in the last 72 hours. Sepsis Labs: Recent Labs  Lab 03/29/20 0542  PROCALCITON 0.24    Recent Results (from the past 240 hour(s))  Resp Panel by RT-PCR (Flu A&B, Covid) Nasopharyngeal Swab     Status: None   Collection Time: 03/25/20 11:33 AM   Specimen: Nasopharyngeal Swab; Nasopharyngeal(NP) swabs in vial transport medium  Result Value Ref Range Status   SARS Coronavirus 2 by RT PCR NEGATIVE NEGATIVE Final    Comment: (NOTE) SARS-CoV-2 target nucleic acids are NOT DETECTED.  The SARS-CoV-2 RNA is generally detectable in upper respiratory specimens during the acute phase of infection. The lowest concentration of SARS-CoV-2 viral copies  this assay can detect is 138 copies/mL. A negative result does not preclude SARS-Cov-2 infection and should not be used as the sole basis for treatment or other patient management decisions. A negative result may occur with  improper specimen collection/handling, submission of specimen other than nasopharyngeal swab, presence of viral mutation(s) within the areas targeted by this assay, and inadequate number of viral copies(<138 copies/mL). A negative result must be combined with clinical observations, patient history, and epidemiological information. The expected result is Negative.  Fact Sheet for  Patients:  EntrepreneurPulse.com.au  Fact Sheet for Healthcare Providers:  IncredibleEmployment.be  This test is no t yet approved or cleared by the [place] FDA and  has been authorized for detection and/or diagnosis of SARS-CoV-2 by FDA under an Emergency Use Authorization (EUA). This EUA will remain  in effect (meaning this test can be used) for the duration of the COVID-19 declaration under Section 564(b)(1) of the Act, 21 U.S.C.section 360bbb-3(b)(1), unless the authorization is terminated  or revoked sooner.       Influenza A by PCR NEGATIVE NEGATIVE Final   Influenza B by PCR NEGATIVE NEGATIVE Final    Comment: (NOTE) The Xpert Xpress SARS-CoV-2/FLU/RSV plus assay is intended as an aid in the diagnosis of influenza from Nasopharyngeal swab specimens and should not be used as a sole basis for treatment. Nasal washings and aspirates are unacceptable for Xpert Xpress SARS-CoV-2/FLU/RSV testing.  Fact Sheet for Patients: EntrepreneurPulse.com.au  Fact Sheet for Healthcare Providers: IncredibleEmployment.be  This test is not yet approved or cleared by the [place] FDA and has been authorized for detection and/or diagnosis of SARS-CoV-2 by FDA under an Emergency Use Authorization (EUA). This EUA  will remain in effect (meaning this test can be used) for the duration of the COVID-19 declaration under Section 564(b)(1) of the Act, 21 U.S.C. section 360bbb-3(b)(1), unless the authorization is terminated or revoked.  Performed at [facility], [address] [place], [place] [address]   MRSA PCR Screening     Status: Abnormal   Collection Time: 03/28/20  6:18 PM  Result Value Ref Range Status   MRSA by PCR POSITIVE (A) NEGATIVE Final    Comment:        The GeneXpert MRSA Assay (FDA approved for NASAL specimens only), is one component of a comprehensive MRSA colonization surveillance program. It is not intended to diagnose MRSA infection nor to guide or monitor treatment for MRSA infections. RESULT CALLED TO, READ BACK BY AND VERIFIED WITH: Earlie Server 03/28/20 AT 1928 BY ACR Performed at [facility], [address] [place], [place] [address]   Acid Fast Smear (AFB)     Status: None   Collection Time: 03/29/20 11:38 AM   Specimen: PATH Cytology Pleural fluid  Result Value Ref Range Status   AFB Specimen Processing Concentration  Final   Acid Fast Smear Negative  Final    Comment: (NOTE) Performed At: [facility] [place], [place] [id number] Rush Farmer MD DD:[id number]    Source (AFB) PLEURAL  Final    Comment: Performed at [facility], [address] [place], [place] [address]  Body fluid culture w Gram Stain     Status: None (Preliminary result)   Collection Time: 03/29/20 11:38 AM   Specimen: PATH Cytology Pleural fluid  Result Value Ref Range Status   Specimen Description   Final    PLEURAL Performed at [facility], [address], [place], [place] [address]    Special Requests   Final    PLEURAL Performed at [facility], [address] [place], [place] [address]    Gram Stain   Final    MODERATE WBC PRESENT, PREDOMINANTLY MONONUCLEAR NO ORGANISMS SEEN Performed at  [facility], [place] [address], [place], [place] [address]    Culture PENDING  Incomplete   Report Status PENDING  Incomplete         Radiology Studies: CT CHEST W CONTRAST  Result Date: 03/28/2020 CLINICAL DATA:  Lung  nodule. EXAM: CT CHEST WITH CONTRAST TECHNIQUE: Multidetector CT imaging of the chest was performed during intravenous contrast administration. CONTRAST:  29mL OMNIPAQUE IOHEXOL 300 MG/ML  SOLN COMPARISON:  Chest radiograph 03/28/2020. FINDINGS: Cardiovascular: Atherosclerotic calcification of the aorta, aortic valve and coronary arteries. Heart is enlarged. No pericardial effusion. Mediastinum/Nodes: Thyroid is heterogeneous and contains low-attenuation nodules measuring up to 7 mm. No follow-up recommended (ref: J Am Coll Radiol. 2015 Feb;12(2): 143-50).No pathologically enlarged mediastinal, hilar or axillary lymph nodes. Esophagus is grossly unremarkable. Lungs/Pleura: Image quality is degraded by respiratory motion. Centrilobular emphysema. Large bilateral pleural effusions with collapse/consolidation in the adjacent lower lobes. 5 mm peripheral left upper lobe nodule (3/39). Airway is unremarkable. Upper Abdomen: Low-attenuation lesions in the liver measure up to 2.2 cm and are likely cysts. Liver may be slightly decreased in attenuation diffusely and the margin is slightly irregular. Visualized portions of the liver, gallbladder and right adrenal gland are otherwise unremarkable. Nodular thickening of the left adrenal gland. Subcentimeter low-attenuation lesions in the kidneys are too small to characterize but statistically, cysts are likely. Visualized portions of the spleen, pancreas, stomach and bowel are unremarkable with the exception of a small hiatal hernia. No upper abdominal adenopathy. Musculoskeletal: Degenerative changes in the spine. No worrisome lytic or sclerotic lesions. Old rib fractures. IMPRESSION: 1. Large bilateral pleural effusions with  collapse/consolidation in both lower lobes, difficult to exclude pneumonia. 2. 5 mm peripheral left upper lobe nodule. No follow-up needed if patient is low-risk. Non-contrast chest CT can be considered in 12 months if patient is high-risk and aching in the consideration patient's age. This recommendation follows the consensus statement: Guidelines for Management of Incidental Pulmonary Nodules Detected on CT Images: From the Fleischner Society 2017; Radiology 2017; 284:228-243. 3. Hepatic steatosis and probable cirrhosis. 4. Aortic atherosclerosis (ICD10-I70.0). Coronary artery calcification. 5.  Emphysema (ICD10-J43.9). Electronically Signed   By: Lorin Picket M.D.   On: 03/28/2020 15:23   DG Chest Port 1 View  Result Date: 03/29/2020 CLINICAL DATA:  Status post right thoracentesis. EXAM: PORTABLE CHEST 1 VIEW COMPARISON:  03/28/2020 FINDINGS: Stable heart size. After right thoracentesis there is very little residual right pleural fluid. No pneumothorax. Stable moderate left pleural effusion remains. No overt pulmonary edema. IMPRESSION: No pneumothorax after right thoracentesis. Very little residual right pleural fluid present. Stable left pleural effusion. Electronically Signed   By: Aletta Edouard M.D.   On: 03/29/2020 12:26   US THORACENTESIS ASP PLEURAL SPACE W/IMG GUIDE  Result Date: 03/29/2020 INDICATION: Patient with history of chronic heart failure and atrial fibrillation found to have bilateral pleural effusions. Request is for therapeutic and diagnostic thoracentesis EXAM: ULTRASOUND GUIDED RIGHT THERAPEUTIC AND DIAGNOSTIC THORACENTESIS MEDICATIONS: Lidocaine 1% 10 mL COMPLICATIONS: None immediate. PROCEDURE: An ultrasound guided thoracentesis was thoroughly discussed with the patient and questions answered. The benefits, risks, alternatives and complications were also discussed. The patient understands and wishes to proceed with the procedure. Written consent was obtained. Ultrasound was  performed to localize and mark an adequate pocket of fluid in the right chest. The area was then prepped and draped in the normal sterile fashion. 1% Lidocaine was used for local anesthesia. Under ultrasound guidance a 6 Fr Safe-T-Centesis catheter was introduced. Thoracentesis was performed. The catheter was removed and a dressing applied. FINDINGS: A total of approximately 600 mL of straw-colored fluid was removed. Samples were sent to the laboratory as requested by the clinical team. IMPRESSION: Successful ultrasound guided therapeutic and diagnostic right-sided thoracentesis yielding 600 mL of pleural  fluid. Read by: Rushie Nyhan, NP Electronically Signed   By: Aletta Edouard M.D.   On: 03/29/2020 12:28        Scheduled Meds: . amiodarone  200 mg Oral Daily  . B-complex with vitamin C  1 tablet Oral Daily  . Chlorhexidine Gluconate Cloth  6 each Topical Q0600  . cholecalciferol  1,000 Units Oral Daily  . furosemide  40 mg Intravenous Daily  . iron polysaccharides  150 mg Oral Daily  . melatonin  5 mg Oral QHS  . mupirocin ointment  1 application Nasal BID  . pantoprazole (PROTONIX) IV  40 mg Intravenous Q12H  . polyethylene glycol  34 g Oral BID  . pravastatin  40 mg Oral q1800   Continuous Infusions: . sodium chloride Stopped (03/28/20 2109)     LOS: 4 days    Time spent: 33 mins     Wyvonnia Dusky, MD [facility] Pager [phone]  If 7PM-7AM, please contact night-coverage 03/30/2020, 7:34 AM

## 2020-03-31 ENCOUNTER — Inpatient Hospital Stay: Payer: Medicare Other

## 2020-03-31 ENCOUNTER — Encounter: Payer: Self-pay | Admitting: Gastroenterology

## 2020-03-31 DIAGNOSIS — J90 Pleural effusion, not elsewhere classified: Secondary | ICD-10-CM | POA: Diagnosis not present

## 2020-03-31 DIAGNOSIS — I5033 Acute on chronic diastolic (congestive) heart failure: Secondary | ICD-10-CM | POA: Diagnosis not present

## 2020-03-31 DIAGNOSIS — J189 Pneumonia, unspecified organism: Secondary | ICD-10-CM

## 2020-03-31 DIAGNOSIS — D649 Anemia, unspecified: Secondary | ICD-10-CM | POA: Diagnosis not present

## 2020-03-31 LAB — CBC
HCT: 31.2 % — ABNORMAL LOW (ref 36.0–46.0)
Hemoglobin: 9.1 g/dL — ABNORMAL LOW (ref 12.0–15.0)
MCH: 24.7 pg — ABNORMAL LOW (ref 26.0–34.0)
MCHC: 29.2 g/dL — ABNORMAL LOW (ref 30.0–36.0)
MCV: 84.8 fL (ref 80.0–100.0)
Platelets: 317 10*3/uL (ref 150–400)
RBC: 3.68 MIL/uL — ABNORMAL LOW (ref 3.87–5.11)
RDW: 26.5 % — ABNORMAL HIGH (ref 11.5–15.5)
WBC: 12.9 10*3/uL — ABNORMAL HIGH (ref 4.0–10.5)
nRBC: 0.3 % — ABNORMAL HIGH (ref 0.0–0.2)

## 2020-03-31 LAB — BASIC METABOLIC PANEL
Anion gap: 10 (ref 5–15)
BUN: 23 mg/dL (ref 8–23)
CO2: 26 mmol/L (ref 22–32)
Calcium: 8.9 mg/dL (ref 8.9–10.3)
Chloride: 99 mmol/L (ref 98–111)
Creatinine, Ser: 0.95 mg/dL (ref 0.44–1.00)
GFR, Estimated: 57 mL/min — ABNORMAL LOW (ref >60)
Glucose, Bld: 95 mg/dL (ref 70–99)
Potassium: 4 mmol/L (ref 3.5–5.1)
Sodium: 135 mmol/L (ref 135–145)

## 2020-03-31 LAB — GLUCOSE, CAPILLARY
Glucose-Capillary: 113 mg/dL — ABNORMAL HIGH (ref 70–99)
Glucose-Capillary: 167 mg/dL — ABNORMAL HIGH (ref 70–99)
Glucose-Capillary: 152 mg/dL — ABNORMAL HIGH (ref 70–99)

## 2020-03-31 LAB — MAGNESIUM: Magnesium: 2.3 mg/dL (ref 1.7–2.4)

## 2020-03-31 MED ORDER — SODIUM CHLORIDE 0.9 % IV SOLN
500.0000 mg | INTRAVENOUS | Status: DC
  Administered 2020-03-31: 13:00:00 500 mg via INTRAVENOUS
  Filled 2020-03-31 (×2): qty 500

## 2020-03-31 MED ORDER — RIVAROXABAN 15 MG PO TABS
15.0000 mg | ORAL_TABLET | Freq: Every day | ORAL | Status: DC
  Administered 2020-03-31 – 2020-04-01 (×2): 15 mg via ORAL
  Filled 2020-03-31 (×3): qty 1

## 2020-03-31 MED ORDER — PANTOPRAZOLE SODIUM 40 MG PO TBEC
40.0000 mg | DELAYED_RELEASE_TABLET | Freq: Two times a day (BID) | ORAL | Status: DC
  Administered 2020-03-31 – 2020-04-02 (×4): 40 mg via ORAL
  Filled 2020-03-31 (×4): qty 1

## 2020-03-31 MED ORDER — FUROSEMIDE 40 MG PO TABS
40.0000 mg | ORAL_TABLET | Freq: Every day | ORAL | Status: DC
  Administered 2020-04-02: 10:00:00 40 mg via ORAL
  Filled 2020-03-31: qty 1

## 2020-03-31 MED ORDER — SODIUM CHLORIDE 0.9 % IV SOLN
1.0000 g | INTRAVENOUS | Status: DC
  Administered 2020-03-31: 1 g via INTRAVENOUS
  Filled 2020-03-31: qty 10
  Filled 2020-03-31: qty 1

## 2020-03-31 NOTE — Progress Notes (Signed)
[name] Lame, MD [address] [place], [place] [address] Phone: [phone] Fax : [phone]   Subjective: Patient was sitting up in bed in no apparent distress.  She denies any abdominal pain nausea vomiting fevers or chills today.  The patient states that she was supposed to go home today but felt too tired and weak and she reports that she is being sent home tomorrow.  The patient's hemoglobin has been stable.   Objective: Vital signs in last 24 hours: Vitals:   03/31/20 0500 03/31/20 0525 03/31/20 0820 03/31/20 1230  BP:  (!) 117/56 135/66 93/78  Pulse:  75 72 72  Resp:  16 14 15   Temp:  98 F (36.7 C) 98 F (36.7 C) (!) 97.3 F (36.3 C)  TempSrc:   Oral Oral  SpO2:  97% 98%   Weight: 49 kg     Height:       Weight change: -0.7 kg  Intake/Output Summary (Last 24 hours) at 03/31/2020 1310 Last data filed at 03/31/2020 1049 Gross per 24 hour  Intake 240 ml  Output 650 ml  Net -410 ml     Exam: Heart:: S1S2 present or without murmur or extra heart sounds Lungs: clear to auscultation and percussion Abdomen: soft, nontender, normal bowel sounds   Lab Results: @LABTEST2 @ Micro Results: Recent Results (from the past 240 hour(s))  Resp Panel by RT-PCR (Flu A&B, Covid) Nasopharyngeal Swab     Status: None   Collection Time: 03/25/20 11:33 AM   Specimen: Nasopharyngeal Swab; Nasopharyngeal(NP) swabs in vial transport medium  Result Value Ref Range Status   SARS Coronavirus 2 by RT PCR NEGATIVE NEGATIVE Final    Comment: (NOTE) SARS-CoV-2 target nucleic acids are NOT DETECTED.  The SARS-CoV-2 RNA is generally detectable in upper respiratory specimens during the acute phase of infection. The lowest concentration of SARS-CoV-2 viral copies this assay can detect is 138 copies/mL. A negative result does not preclude SARS-Cov-2 infection and should not be used as the sole basis for treatment or other patient management decisions. A negative result may  occur with  improper specimen collection/handling, submission of specimen other than nasopharyngeal swab, presence of viral mutation(s) within the areas targeted by this assay, and inadequate number of viral copies(<138 copies/mL). A negative result must be combined with clinical observations, patient history, and epidemiological information. The expected result is Negative.  Fact Sheet for Patients:  EntrepreneurPulse.com.au  Fact Sheet for Healthcare Providers:  IncredibleEmployment.be  This test is no t yet approved or cleared by the [place] FDA and  has been authorized for detection and/or diagnosis of SARS-CoV-2 by FDA under an Emergency Use Authorization (EUA). This EUA will remain  in effect (meaning this test can be used) for the duration of the COVID-19 declaration under Section 564(b)(1) of the Act, 21 U.S.C.section 360bbb-3(b)(1), unless the authorization is terminated  or revoked sooner.       Influenza A by PCR NEGATIVE NEGATIVE Final   Influenza B by PCR NEGATIVE NEGATIVE Final    Comment: (NOTE) The Xpert Xpress SARS-CoV-2/FLU/RSV plus assay is intended as an aid in the diagnosis of influenza from Nasopharyngeal swab specimens and should not be used as a sole basis for treatment. Nasal washings and aspirates are unacceptable for Xpert Xpress SARS-CoV-2/FLU/RSV testing.  Fact Sheet for Patients: EntrepreneurPulse.com.au  Fact Sheet for Healthcare Providers: IncredibleEmployment.be  This test is not yet approved or cleared by the [place] FDA and has been authorized for detection and/or  diagnosis of SARS-CoV-2 by FDA under an Emergency Use Authorization (EUA). This EUA will remain in effect (meaning this test can be used) for the duration of the COVID-19 declaration under Section 564(b)(1) of the Act, 21 U.S.C. section 360bbb-3(b)(1), unless the authorization is terminated  or revoked.  Performed at [facility], [address] [place], [place] [address]   MRSA PCR Screening     Status: Abnormal   Collection Time: 03/28/20  6:18 PM  Result Value Ref Range Status   MRSA by PCR POSITIVE (A) NEGATIVE Final    Comment:        The GeneXpert MRSA Assay (FDA approved for NASAL specimens only), is one component of a comprehensive MRSA colonization surveillance program. It is not intended to diagnose MRSA infection nor to guide or monitor treatment for MRSA infections. RESULT CALLED TO, READ BACK BY AND VERIFIED WITH: Earlie Server 03/28/20 AT 1928 BY ACR Performed at [facility] [address] [place], [place] [address]   Acid Fast Smear (AFB)     Status: None   Collection Time: 03/29/20 11:38 AM   Specimen: PATH Cytology Pleural fluid  Result Value Ref Range Status   AFB Specimen Processing Concentration  Final   Acid Fast Smear Negative  Final    Comment: (NOTE) Performed At: [facility] [place], [place] [id number] Rush Farmer MD YP:[id number]    Source (AFB) PLEURAL  Final    Comment: Performed at [facility], [address] [place], [place] [address]  Body fluid culture w Gram Stain     Status: None (Preliminary result)   Collection Time: 03/29/20 11:38 AM   Specimen: PATH Cytology Pleural fluid  Result Value Ref Range Status   Specimen Description   Final    PLEURAL Performed at [facility], [address], [place], [place] [address]    Special Requests   Final    PLEURAL Performed at [facility], [address] [place], [place] [address]    Gram Stain   Final    MODERATE WBC PRESENT, PREDOMINANTLY MONONUCLEAR NO ORGANISMS SEEN    Culture   Final    NO GROWTH 2 DAYS Performed at [facility], [place] [address] [place], [place] [address]    Report Status PENDING  Incomplete  Body fluid culture w Gram Stain     Status: None (Preliminary  result)   Collection Time: 03/30/20  3:34 PM   Specimen: PATH Cytology Pleural fluid  Result Value Ref Range Status   Specimen Description   Final    PLEURAL Performed at [facility], [address] [place], [place] [address]    Special Requests   Final    NONE Performed at [facility], [address] [place], [place] [address]    Gram Stain   Final    WBC PRESENT, PREDOMINANTLY MONONUCLEAR NO ORGANISMS SEEN Performed at [facility], [place] [address], [place], [place] [address]    Culture PENDING  Incomplete   Report Status PENDING  Incomplete   Studies/Results: DG Chest Port 1 View  Result Date: 03/31/2020 CLINICAL DATA:  Shortness of breath. EXAM: PORTABLE CHEST 1 VIEW COMPARISON:  03/30/2020. FINDINGS: Mediastinum and hilar structures normal. Mild left base infiltrate noted on today's exam. Tiny left pleural effusion again noted. Mild right base atelectasis. No pneumothorax. Heart size normal. No acute bony abnormality. IMPRESSION: 1. Mild left base infiltrate noted today's exam. Small left pleural effusion  again noted. 2.  Mild right base atelectasis. Electronically Signed   By: Marcello Moores  Register   On: 03/31/2020 11:14   DG Chest Port 1 View  Result Date: 03/30/2020 CLINICAL DATA:  Status post left thoracentesis. EXAM: PORTABLE CHEST 1 VIEW COMPARISON:  03/29/2020 FINDINGS: The heart size and mediastinal contours are within normal limits. After left thoracentesis there is no further discernible pleural fluid on the left. There is significant improved aeration of the left lower lung. No pneumothorax identified. Stable bilateral pulmonary interstitial prominence. No right pleural fluid. The visualized skeletal structures are unremarkable. IMPRESSION: No further discernible left pleural fluid after thoracentesis. No pneumothorax. Improved aeration of the left lower lung. Electronically Signed   By: Aletta Edouard M.D.   On: 03/30/2020 16:18   US THORACENTESIS  ASP PLEURAL SPACE W/IMG GUIDE  Result Date: 03/30/2020 INDICATION: Chronic heart failure. Bilateral pleural effusions. Right thoracentesis performed on 03/29/2020. EXAM: ULTRASOUND GUIDED LEFT THORACENTESIS MEDICATIONS: None. COMPLICATIONS: None immediate. PROCEDURE: An ultrasound guided thoracentesis was thoroughly discussed with the patient and questions answered. The benefits, risks, alternatives and complications were also discussed. The patient understands and wishes to proceed with the procedure. Written consent was obtained. Ultrasound was performed to localize and mark an adequate pocket of fluid in the left chest. The area was then prepped and draped in the normal sterile fashion. 1% Lidocaine was used for local anesthesia. Under ultrasound guidance a 19 gauge, 7-cm, Yueh catheter was introduced. Thoracentesis was performed. The catheter was removed and a dressing applied. FINDINGS: A total of approximately 550 mL of straw-colored fluid was removed. IMPRESSION: Successful ultrasound guided left thoracentesis yielding 550 mL of pleural fluid. Electronically Signed   By: Miachel Roux M.D.   On: 03/30/2020 17:17   Medications: I have reviewed the patient's current medications. Scheduled Meds: . amiodarone  200 mg Oral Daily  . B-complex with vitamin C  1 tablet Oral Daily  . Chlorhexidine Gluconate Cloth  6 each Topical Q0600  . cholecalciferol  1,000 Units Oral Daily  . [START ON 04/01/2020] furosemide  40 mg Oral Daily  . iron polysaccharides  150 mg Oral Daily  . melatonin  5 mg Oral QHS  . mupirocin ointment  1 application Nasal BID  . pantoprazole  40 mg Oral BID AC  . polyethylene glycol  34 g Oral BID  . pravastatin  40 mg Oral q1800  . rivaroxaban  15 mg Oral Q supper  . verapamil  180 mg Oral Daily   Continuous Infusions: . sodium chloride Stopped (03/28/20 2109)  . azithromycin    . cefTRIAXone (ROCEPHIN)  IV     PRN Meds:.sodium chloride, acetaminophen, hydrALAZINE,  hydrocortisone cream, morphine injection, ondansetron (ZOFRAN) IV, traMADol   Assessment: Principal Problem:   Symptomatic anemia Active Problems:   HTN (hypertension)   Permanent atrial fibrillation (HCC)   Hypokalemia   Iron deficiency anemia   HLD (hyperlipidemia)    Plan: Nothing further to do from a GI point of view at this time.  The patient should follow-up with Dr. Marius Ditch as an outpatient.  The patient's hemoglobin is stable at this time.  I will sign off.  Please call if any further GI concerns or questions.  We would like to thank you for the opportunity to participate in the care of [place].     LOS: 5 days   [name] [name], Marval Regal 03/31/2020, 1:10 PM Pager [phone] 7am-5pm  Check AMION for 5pm -7am coverage and on weekends

## 2020-03-31 NOTE — Progress Notes (Signed)
Occupational Therapy Treatment Patient Details Name: [name] [name] MRN: [id number] DOB: 1929-03-18 Today's Date: 03/31/2020    History of present illness [name] [name] is a 87 y.o. female with medical history significant of atrial fibrillation on Xarelto, hypertension, hyperlipidemia, hemorrhoids, hearing loss, diverticulosis, iron deficiency anemia, Bell's palsy, anxiety, who presents with fatigue and abnormal lab.     Patient states that she has been having generalized weakness and fatigue for almost 4 weeks, which has progressively worsened in the past several days.  Patient was seen by PCP yesterday and found to have hemoglobin 6.7 and potassium 2.7.  Patient denies rectal bleeding or dark stool.   OT comments  [name] [name] was seen for OT treatment on this date following PT request for assistance 2/2 extensive hygiene needs. Pt continues to demonstrate poor safety awareness and is unable to tell when she has completed toileting - incontinent of urine and bowel this date.   MIN A don/doff mesh briefs and perihygiene seated on BSC - increased time to complete perihygiene thoroguhly. Pt requires cues for thoroughness and safety. Pt making good progress toward goals. Pt continues to benefit from skilled OT services to maximize return to PLOF and minimize risk of future falls, injury, caregiver burden, and readmission. Will continue to follow POC. Discharge recommendation remains appropriate.    Follow Up Recommendations  Home health OT;Supervision/Assistance - 24 hour    Equipment Recommendations  3 in 1 bedside commode    Recommendations for Other Services      Precautions / Restrictions Precautions Precautions: Fall Restrictions Weight Bearing Restrictions: No       Mobility Bed Mobility Overal bed mobility: Needs Assistance Bed Mobility: Sit to Supine     Supine to sit: Min assist Sit to supine: Min assist        Transfers Overall transfer level: Needs  assistance Equipment used: 4-wheeled walker Transfers: Sit to/from Stand Sit to Stand: Min guard         General transfer comment: VCs to use 4WW brakes, pt has difficulty hearing.    Balance Overall balance assessment: Needs assistance Sitting-balance support: Feet supported Sitting balance-Leahy Scale: Good     Standing balance support: Bilateral upper extremity supported Standing balance-Leahy Scale: Fair Standing balance comment: reliant on RW and min guard for safety                           ADL either performed or assessed with clinical judgement   ADL Overall ADL's : Needs assistance/impaired                                       General ADL Comments: MIN A don/doff mesh briefs and perihygiene seated on BSC. Pt requires cues for thoroughness and safety. Pt is unable to state when she is finished with toileting               Cognition Arousal/Alertness: Awake/alert Behavior During Therapy: WFL for tasks assessed/performed Overall Cognitive Status: Within Functional Limits for tasks assessed                                 General Comments: Poor safety awareness        Exercises Exercises: Other exercises Other Exercises Other Exercises: Pt educated re: OT role, DMe recs, d/c recs,  falls prevention, ECS Other Exercises: LBD, UBD, sit>sup, sit<>stand, sitting/standing balance/tolerance, bathing Other Exercises: Toileting and dressing chance with assistance from OT.      General Comments Pt was visibly soiled and had urine that had soaked through mesh underwear, gown, chuck pad, paper pad, and sheet.  Purewick was not placed when arriving to room.    Pertinent Vitals/ Pain       Pain Assessment: No/denies pain Faces Pain Scale: Hurts little more Pain Location: L ankle Pain Descriptors / Indicators: Discomfort;Dull;Grimacing Pain Intervention(s): Limited activity within patient's tolerance;Monitored during  session;Repositioned      Frequency  Min 2X/week        Progress Toward Goals  OT Goals(current goals can now be found in the care plan section)  Progress towards OT goals: Progressing toward goals  Acute Rehab OT Goals Patient Stated Goal: to return home OT Goal Formulation: With patient Time For Goal Achievement: 04/13/20 Potential to Achieve Goals: Good ADL Goals Pt Will Perform Lower Body Dressing: with modified independence;sit to/from stand Pt Will Transfer to Toilet: with modified independence;ambulating;regular height toilet Additional ADL Goal #1: Pt will Independently verbalize x3 falls prevention strategies.  Plan Discharge plan remains appropriate;Frequency remains appropriate       AM-PAC OT "6 Clicks" Daily Activity     Outcome Measure   Help from another person eating meals?: None Help from another person taking care of personal grooming?: A Little Help from another person toileting, which includes using toliet, bedpan, or urinal?: A Little Help from another person bathing (including washing, rinsing, drying)?: A Little Help from another person to put on and taking off regular upper body clothing?: A Little Help from another person to put on and taking off regular lower body clothing?: A Little 6 Click Score: 19    End of Session    OT Visit Diagnosis: Other abnormalities of gait and mobility (R26.89);Muscle weakness (generalized) (M62.81)   Activity Tolerance Patient tolerated treatment well   Patient Left in bed;with call bell/phone within reach;with nursing/sitter in room   Nurse Communication Mobility status        Time: 5361-4431 OT Time Calculation (min): 25 min  Charges: OT General Charges $OT Visit: 1 Visit OT Treatments $Self Care/Home Management : 8-22 mins  Dessie Coma, M.S. OTR/L  03/31/20, 4:57 PM  ascom [phone]

## 2020-03-31 NOTE — Care Management Important Message (Signed)
Important Message  Patient Details  Name: [name] [name] MRN: [id number] Date of Birth: December 02, 1929   Medicare Important Message Given:  Yes     Juliann Pulse A Allmond 03/31/2020, 11:40 AM

## 2020-03-31 NOTE — Progress Notes (Signed)
Physical Therapy Treatment Patient Details Name: [name] [name] MRN: [id number] DOB: Aug 21, 1929 Today's Date: 03/31/2020    History of Present Illness [name] [name] is a 87 y.o. female with medical history significant of atrial fibrillation on Xarelto, hypertension, hyperlipidemia, hemorrhoids, hearing loss, diverticulosis, iron deficiency anemia, Bell's palsy, anxiety, who presents with fatigue and abnormal lab.     Patient states that she has been having generalized weakness and fatigue for almost 4 weeks, which has progressively worsened in the past several days.  Patient was seen by PCP yesterday and found to have hemoglobin 6.7 and potassium 2.7.  Patient denies rectal bleeding or dark stool.    PT Comments    Pt received in Semi-Fowler's position and agreeable to therapy.   Upon entering the room, urine smell is present and pt reports she may have voided in the bed.  Following bed-level exercises, pt was able to come to seated position at EOB.  Bed is visibly soiled through mesh underwear, gown, paper pad, cloth chuck pad, and sheet.  Pt was able to ambulate around the nursing station twice.  Pt has difficulty with keeping rollator close to body when ambulating.  Pt would benefit from use of FWW instead of rollator going forward. When arriving back to the room, pt was able to sit on rollator while therapist changed linens.  OT overlapped with PT for assistance necessary for hygiene necessities.  Pt transferred to [facility] where she was able to void and received self-care from both therapist.  Pt changed into clean linens, and transferred back to bed with bed alarm set, all needs within reach and nursing notified of mobility and hygiene status.  Current discharge plans to HHPT remain appropriate at this time.  Updated with supervision assistance needed with 24/7 support.  Pt would also benefit from use of FWW instead of rollator due to fatigue and inability to keep rollator close to body  throughout ambulation.    Pt will continue to benefit from skilled therapy in order to address deficits listed below.     Follow Up Recommendations  Home health PT;Supervision/Assistance - 24 hour     Equipment Recommendations  Rolling walker with 5" wheels    Recommendations for Other Services       Precautions / Restrictions Precautions Precautions: Fall Restrictions Weight Bearing Restrictions: No    Mobility  Bed Mobility Overal bed mobility: Needs Assistance Bed Mobility: Sit to Supine     Supine to sit: Min assist Sit to supine: Min assist        Transfers Overall transfer level: Needs assistance Equipment used: 4-wheeled walker Transfers: Sit to/from Stand Sit to Stand: Min guard         General transfer comment: VCs to use 4WW brakes, pt has difficulty hearing.  Ambulation/Gait Ambulation/Gait assistance: Min guard;Min assist Gait Distance (Feet): 360 Feet Assistive device: 4-wheeled walker Gait Pattern/deviations: Step-through pattern;Decreased stride length;Decreased step length - right;Decreased step length - left Gait velocity: decreased   General Gait Details: requires min guard for safety with rollator, 2 rest breaks, 1 LOB while rotating after standing with rollator.   Stairs             Wheelchair Mobility    Modified Rankin (Stroke Patients Only)       Balance Overall balance assessment: Needs assistance Sitting-balance support: Feet supported Sitting balance-Leahy Scale: Good     Standing balance support: Bilateral upper extremity supported Standing balance-Leahy Scale: Fair Standing balance comment: reliant on RW and  min guard for safety                            Cognition Arousal/Alertness: Awake/alert Behavior During Therapy: WFL for tasks assessed/performed Overall Cognitive Status: Within Functional Limits for tasks assessed                                 General Comments: Poor safety  awareness      Exercises Total Joint Exercises Ankle Circles/Pumps: AROM;Strengthening;Both;10 reps;Supine Quad Sets: AROM;Strengthening;Both;10 reps;Supine Gluteal Sets: AROM;Strengthening;Both;10 reps;Supine Heel Slides: AROM;Strengthening;Both;10 reps;Supine Hip ABduction/ADduction: AROM;Strengthening;Both;10 reps;Supine Straight Leg Raises: AROM;Strengthening;Both;10 reps;Supine Other Exercises Other Exercises: Pt educated re: OT role, DMe recs, d/c recs, falls prevention, ECS Other Exercises: LBD, UBD, sit>sup, sit<>stand, sitting/standing balance/tolerance, bathing Other Exercises: Toileting and dressing chance with assistance from OT.    General Comments General comments (skin integrity, edema, etc.): Pt was visibly soiled and had urine that had soaked through mesh underwear, gown, chuck pad, paper pad, and sheet.  Purewick was not placed when arriving to room.      Pertinent Vitals/Pain Pain Assessment: No/denies pain Faces Pain Scale: Hurts little more Pain Location: L ankle Pain Descriptors / Indicators: Discomfort;Dull;Grimacing Pain Intervention(s): Limited activity within patient's tolerance;Monitored during session;Repositioned    Home Living                      Prior Function            PT Goals (current goals can now be found in the care plan section) Acute Rehab PT Goals Patient Stated Goal: to return home PT Goal Formulation: With patient Time For Goal Achievement: 04/11/20 Potential to Achieve Goals: Good Progress towards PT goals: Progressing toward goals    Frequency    Min 2X/week      PT Plan      Co-evaluation              AM-PAC PT "6 Clicks" Mobility   Outcome Measure  Help needed turning from your back to your side while in a flat bed without using bedrails?: A Little Help needed moving from lying on your back to sitting on the side of a flat bed without using bedrails?: A Little Help needed moving to and from a bed  to a chair (including a wheelchair)?: A Little Help needed standing up from a chair using your arms (e.g., wheelchair or bedside chair)?: A Little Help needed to walk in hospital room?: A Little Help needed climbing 3-5 steps with a railing? : A Little 6 Click Score: 18    End of Session Equipment Utilized During Treatment: Gait belt Activity Tolerance: Patient tolerated treatment well Patient left: in bed;with call bell/phone within reach;with bed alarm set Nurse Communication: Mobility status;Other (comment) (Nursing notified of bedding needed to be changed.) PT Visit Diagnosis: Unsteadiness on feet (R26.81);Other abnormalities of gait and mobility (R26.89);Muscle weakness (generalized) (M62.81)     Time: 9518-8416 PT Time Calculation (min) (ACUTE ONLY): 63 min  Charges:  $Gait Training: 23-37 mins $Therapeutic Exercise: 8-22 mins $Self Care/Home Management: 8-22                      Gwenlyn Saran, PT, DPT 03/31/20, 5:03 PM

## 2020-03-31 NOTE — Progress Notes (Signed)
PROGRESS NOTE   HPI was taken from Dr. Blaine Hamper: [name] [name] is a 87 y.o. female with medical history significant of atrial fibrillation on Xarelto, hypertension, hyperlipidemia, hemorrhoids, hearing loss, diverticulosis, iron deficiency anemia, Bell's palsy, anxiety, who presents with fatigue and abnormal lab.  Patient states that she has been having generalized weakness and fatigue for almost 4 weeks, which has progressively worsened in the past several days.  Patient was seen by PCP yesterday and found to have hemoglobin 6.7 and potassium 2.7.  Patient denies rectal bleeding or dark stool.  Per her son, patient had some abdominal pain 2 weeks ago, but today patient denies abdominal pain.  No nausea, vomiting or diarrhea.  Patient denies chest pain, shortness breath, cough, fever or chills.  No symptoms of UTI.  No unilateral numbness or tingling in extremities.   ED Course: pt was found to have hemoglobin 6.5 (13.3 on 01/16/2020), pending Covid PCR, positive FOBT, INR 2.2, potassium 2.3, magnesium 2.0, temperature normal, blood pressure 133/62, heart rate 96, RR 17, oxygen saturation 100% on room air.  Pending CT    Hospital Course from Dr. Jimmye Norman 2/28-03/31/20: Pt was found to have symptomatic anemia and was given 1 unit of pRBCs. Fecal occult was positive and EGD showed granular mucosa in the esophagus which was biopsied. Pathology was neg for goblet, dysplasia & malignancy. Furthermore, pt was found to have large b/l pleural effusions. Pt is s/p b/l thoracentesis. B/l pleural fluid cxs are NGTD. Right pleural fluid cytology was neg for evidence of malignancy. Left pleural fluid cytology is still pending. Xarelto was held for thoracentesis but restarted today. Pt c/o pain w/ deep breaths today and repeat CXR shows small left pleural effusion and mild left base infiltrate. Pt was started on IV ceftriaxone and azithromycin.     BRAZIL VOYTKO  LFY:[id number] DOB: 27-May-1929 DOA:  03/25/2020 PCP: [name], MD    Assessment & Plan:   Principal Problem:   Symptomatic anemia Active Problems:   HTN (hypertension)   Permanent atrial fibrillation (HCC)   Hypokalemia   Iron deficiency anemia   HLD (hyperlipidemia)   Symptomatic anemia: w/ fecal occult positive. S/p 1 unit of pRBCs transfused. Continue on PPI. Continue to hold xarelto. EGD showed granular mucosa in the esophagus which was biopsied. Pathology shows neg for goblet cells, dysplasia & malignancy   B/l pleural effusions: large. S/p b/l thoracentesis. Right pleural fluid cx is NGTD & cytology neg for malignancy.  Left pleural fluid cx is NGTD & cytology is still pending. Pro-cal 0.24  CAP: as found CXR today. Started on IV ceftriaxone, azithromycin. Encourage incentive spirometry   IDA: completed IV iron x 3 days. Continue on po iron supplements   Left lung nodule: 4.8 mm on CXR & CT chest showed 5 mm peripheral left upper lobe nodule and rads recs f/u CT in 12 months   Acute hypoxic respiratory failure: resolved  Acute on chronic diastolic CHF exacerbation: continue on lasix. Monitor I/Os.   HTN: will continue on home dose of veramapil    Permanent atrial fibrillation: continue on amiodarone. Will restart xarelto today   Leukocytosis: reactive vs infection.   Hypokalemia: within normal limits   HLD: continue on statin    DVT prophylaxis: xarelto  Code Status: partial  Family Communication: discussed pt's care w/ pt's son, Percell Miller and answered his questions Disposition Plan: likely d/c home w/ home health   Level of care: Med-Surg   Consultants:   GI   Procedures:  Antimicrobials:   Status is: Inpatient  Remains inpatient appropriate because:Ongoing diagnostic testing needed not appropriate for outpatient work up, IV treatments appropriate due to intensity of illness or inability to take PO and Inpatient level of care appropriate due to severity of illness, found to  have pneumonia today and started on IV abxs and pt wanted to stay one day more but pt can be d/c tomorrow if remains stable    Dispo: The patient is from: Home              Anticipated d/c is to: Home w/ home health               Patient currently is not medically stable to d/c.   Difficult to place patient No      Subjective: Pt c/o pain w/ deep breaths  Objective: Vitals:   03/31/20 0500 03/31/20 0525 03/31/20 0820 03/31/20 1230  BP:  (!) 117/56 135/66 93/78  Pulse:  75 72 72  Resp:  16 14 15   Temp:  98 F (36.7 C) 98 F (36.7 C) (!) 97.3 F (36.3 C)  TempSrc:   Oral Oral  SpO2:  97% 98%   Weight: 49 kg     Height:        Intake/Output Summary (Last 24 hours) at 03/31/2020 1252 Last data filed at 03/31/2020 1049 Gross per 24 hour  Intake 240 ml  Output 650 ml  Net -410 ml   Filed Weights   03/29/20 0533 03/30/20 0500 03/31/20 0500  Weight: 53.7 kg 49.7 kg 49 kg    Examination:  General exam: Appears calm   Respiratory system: decreased breath sounds b/l  Cardiovascular system: S1/S2+. No rubs or gallops  Gastrointestinal system: Abd is soft, NT, ND & hypoactive bowel sounds  Central nervous system: Alert and oriented. Moves all 4 extremities  Psychiatry: Judgement and insight appear normal. Flat mood and affect      Data Reviewed: I have personally reviewed following labs and imaging studies  CBC: Recent Labs  Lab 03/25/20 1133 03/26/20 0215 03/27/20 0510 03/28/20 0604 03/29/20 0542 03/30/20 0619 03/31/20 0549  WBC 10.2   < > 12.1* 12.6* 12.1* 13.1* 12.9*  NEUTROABS 6.8  --   --   --   --   --   --   HGB 6.5*   < > 7.8* 9.4* 9.3* 9.1* 9.1*  HCT 22.0*   < > 26.0* 31.4* 31.5* 31.6* 31.2*  MCV 77.5*   < > 80.0 81.3 82.7 84.3 84.8  PLT 326   < > 298 332 323 326 317   < > = values in this interval not displayed.   Basic Metabolic Panel: Recent Labs  Lab 03/27/20 0510 03/28/20 0604 03/29/20 0542 03/30/20 0619 03/31/20 0549  NA 140 140 139 140  135  K 3.2* 4.1 4.1 5.0 4.0  CL 105 105 104 103 99  CO2 26 27 24 29 26   GLUCOSE 83 87 104* 105* 95  BUN 13 13 16 15 23   CREATININE 0.75 0.73 0.72 0.71 0.95  CALCIUM 8.6* 9.1 9.0 9.1 8.9  MG 2.2 2.3 2.4 2.5* 2.3   GFR: Estimated Creatinine Clearance: 31.1 mL/min (by C-G formula based on SCr of 0.95 mg/dL). Liver Function Tests: Recent Labs  Lab 03/25/20 1133  AST 24  ALT 16  ALKPHOS 67  BILITOT 0.7  PROT 6.8  ALBUMIN 3.3*   No results for input(s): LIPASE, AMYLASE in the last 168 hours. No results for input(s):  AMMONIA in the last 168 hours. Coagulation Profile: Recent Labs  Lab 03/25/20 1133  INR 2.2*   Cardiac Enzymes: No results for input(s): CKTOTAL, CKMB, CKMBINDEX, TROPONINI in the last 168 hours. BNP (last 3 results) No results for input(s): PROBNP in the last 8760 hours. HbA1C: No results for input(s): HGBA1C in the last 72 hours. CBG: Recent Labs  Lab 03/29/20 1643 03/30/20 0753 03/30/20 1151 03/31/20 0823 03/31/20 1234  GLUCAP 135* 102* 211* 113* 167*   Lipid Profile: No results for input(s): CHOL, HDL, LDLCALC, TRIG, CHOLHDL, LDLDIRECT in the last 72 hours. Thyroid Function Tests: No results for input(s): TSH, T4TOTAL, FREET4, T3FREE, THYROIDAB in the last 72 hours. Anemia Panel: No results for input(s): VITAMINB12, FOLATE, FERRITIN, TIBC, IRON, RETICCTPCT in the last 72 hours. Sepsis Labs: Recent Labs  Lab 03/29/20 0542  PROCALCITON 0.24    Recent Results (from the past 240 hour(s))  Resp Panel by RT-PCR (Flu A&B, Covid) Nasopharyngeal Swab     Status: None   Collection Time: 03/25/20 11:33 AM   Specimen: Nasopharyngeal Swab; Nasopharyngeal(NP) swabs in vial transport medium  Result Value Ref Range Status   SARS Coronavirus 2 by RT PCR NEGATIVE NEGATIVE Final    Comment: (NOTE) SARS-CoV-2 target nucleic acids are NOT DETECTED.  The SARS-CoV-2 RNA is generally detectable in upper respiratory specimens during the acute phase of infection.  The lowest concentration of SARS-CoV-2 viral copies this assay can detect is 138 copies/mL. A negative result does not preclude SARS-Cov-2 infection and should not be used as the sole basis for treatment or other patient management decisions. A negative result may occur with  improper specimen collection/handling, submission of specimen other than nasopharyngeal swab, presence of viral mutation(s) within the areas targeted by this assay, and inadequate number of viral copies(<138 copies/mL). A negative result must be combined with clinical observations, patient history, and epidemiological information. The expected result is Negative.  Fact Sheet for Patients:  EntrepreneurPulse.com.au  Fact Sheet for Healthcare Providers:  IncredibleEmployment.be  This test is no t yet approved or cleared by the [place] FDA and  has been authorized for detection and/or diagnosis of SARS-CoV-2 by FDA under an Emergency Use Authorization (EUA). This EUA will remain  in effect (meaning this test can be used) for the duration of the COVID-19 declaration under Section 564(b)(1) of the Act, 21 U.S.C.section 360bbb-3(b)(1), unless the authorization is terminated  or revoked sooner.       Influenza A by PCR NEGATIVE NEGATIVE Final   Influenza B by PCR NEGATIVE NEGATIVE Final    Comment: (NOTE) The Xpert Xpress SARS-CoV-2/FLU/RSV plus assay is intended as an aid in the diagnosis of influenza from Nasopharyngeal swab specimens and should not be used as a sole basis for treatment. Nasal washings and aspirates are unacceptable for Xpert Xpress SARS-CoV-2/FLU/RSV testing.  Fact Sheet for Patients: EntrepreneurPulse.com.au  Fact Sheet for Healthcare Providers: IncredibleEmployment.be  This test is not yet approved or cleared by the [place] FDA and has been authorized for detection and/or diagnosis of SARS-CoV-2 by FDA  under an Emergency Use Authorization (EUA). This EUA will remain in effect (meaning this test can be used) for the duration of the COVID-19 declaration under Section 564(b)(1) of the Act, 21 U.S.C. section 360bbb-3(b)(1), unless the authorization is terminated or revoked.  Performed at [facility], [address], [place], [place] [address]   MRSA PCR Screening     Status: Abnormal   Collection Time: 03/28/20  6:18 PM  Result  Value Ref Range Status   MRSA by PCR POSITIVE (A) NEGATIVE Final    Comment:        The GeneXpert MRSA Assay (FDA approved for NASAL specimens only), is one component of a comprehensive MRSA colonization surveillance program. It is not intended to diagnose MRSA infection nor to guide or monitor treatment for MRSA infections. RESULT CALLED TO, READ BACK BY AND VERIFIED WITH: Earlie Server 03/28/20 AT 1928 BY ACR Performed at [facility], [address] [place], [place] [address]   Acid Fast Smear (AFB)     Status: None   Collection Time: 03/29/20 11:38 AM   Specimen: PATH Cytology Pleural fluid  Result Value Ref Range Status   AFB Specimen Processing Concentration  Final   Acid Fast Smear Negative  Final    Comment: (NOTE) Performed At: [facility] [place], [place] [id number] Rush Farmer MD [id number]    Source (AFB) PLEURAL  Final    Comment: Performed at [facility], [address] [place], [place] [address]  Body fluid culture w Gram Stain     Status: None (Preliminary result)   Collection Time: 03/29/20 11:38 AM   Specimen: PATH Cytology Pleural fluid  Result Value Ref Range Status   Specimen Description   Final    PLEURAL Performed at [facility], [address], [place], [place] [address]    Special Requests   Final    PLEURAL Performed at [facility], [address] [place], [place] [address]    Gram Stain   Final    MODERATE WBC PRESENT,  PREDOMINANTLY MONONUCLEAR NO ORGANISMS SEEN    Culture   Final    NO GROWTH 2 DAYS Performed at [facility], [place] [address], [place], [place] [address]    Report Status PENDING  Incomplete  Body fluid culture w Gram Stain     Status: None (Preliminary result)   Collection Time: 03/30/20  3:34 PM   Specimen: PATH Cytology Pleural fluid  Result Value Ref Range Status   Specimen Description   Final    PLEURAL Performed at [facility], [address], [place], [place] [address]    Special Requests   Final    NONE Performed at [facility], [address] [place], [place] [address]    Gram Stain   Final    WBC PRESENT, PREDOMINANTLY MONONUCLEAR NO ORGANISMS SEEN Performed at [facility], [place] [address], [place], [place] [address]    Culture PENDING  Incomplete   Report Status PENDING  Incomplete         Radiology Studies: DG Chest Port 1 View  Result Date: 03/31/2020 CLINICAL DATA:  Shortness of breath. EXAM: PORTABLE CHEST 1 VIEW COMPARISON:  03/30/2020. FINDINGS: Mediastinum and hilar structures normal. Mild left base infiltrate noted on today's exam. Tiny left pleural effusion again noted. Mild right base atelectasis. No pneumothorax. Heart size normal. No acute bony abnormality. IMPRESSION: 1. Mild left base infiltrate noted today's exam. Small left pleural effusion again noted. 2.  Mild right base atelectasis. Electronically Signed   By: Marcello Moores  Register   On: 03/31/2020 11:14   DG Chest Port 1 View  Result Date: 03/30/2020 CLINICAL DATA:  Status post left thoracentesis. EXAM: PORTABLE CHEST 1 VIEW COMPARISON:  03/29/2020 FINDINGS: The heart size and mediastinal contours are within normal limits. After left thoracentesis there is no further discernible pleural fluid on the left. There is significant improved  aeration of the left lower lung. No pneumothorax identified. Stable bilateral pulmonary interstitial prominence. No right pleural fluid.  The visualized skeletal structures are unremarkable. IMPRESSION: No further discernible left pleural fluid after thoracentesis. No pneumothorax. Improved aeration of the left lower lung. Electronically Signed   By: Aletta Edouard M.D.   On: 03/30/2020 16:18   US THORACENTESIS ASP PLEURAL SPACE W/IMG GUIDE  Result Date: 03/30/2020 INDICATION: Chronic heart failure. Bilateral pleural effusions. Right thoracentesis performed on 03/29/2020. EXAM: ULTRASOUND GUIDED LEFT THORACENTESIS MEDICATIONS: None. COMPLICATIONS: None immediate. PROCEDURE: An ultrasound guided thoracentesis was thoroughly discussed with the patient and questions answered. The benefits, risks, alternatives and complications were also discussed. The patient understands and wishes to proceed with the procedure. Written consent was obtained. Ultrasound was performed to localize and mark an adequate pocket of fluid in the left chest. The area was then prepped and draped in the normal sterile fashion. 1% Lidocaine was used for local anesthesia. Under ultrasound guidance a 19 gauge, 7-cm, Yueh catheter was introduced. Thoracentesis was performed. The catheter was removed and a dressing applied. FINDINGS: A total of approximately 550 mL of straw-colored fluid was removed. IMPRESSION: Successful ultrasound guided left thoracentesis yielding 550 mL of pleural fluid. Electronically Signed   By: Miachel Roux M.D.   On: 03/30/2020 17:17        Scheduled Meds: . amiodarone  200 mg Oral Daily  . B-complex with vitamin C  1 tablet Oral Daily  . Chlorhexidine Gluconate Cloth  6 each Topical Q0600  . cholecalciferol  1,000 Units Oral Daily  . [START ON 04/01/2020] furosemide  40 mg Oral Daily  . iron polysaccharides  150 mg Oral Daily  . melatonin  5 mg Oral QHS  . mupirocin ointment  1 application Nasal BID  . pantoprazole  40 mg Oral BID AC  . polyethylene glycol  34 g Oral BID  . pravastatin  40 mg Oral q1800  . rivaroxaban  15 mg Oral Q supper   . verapamil  180 mg Oral Daily   Continuous Infusions: . sodium chloride Stopped (03/28/20 2109)  . azithromycin    . cefTRIAXone (ROCEPHIN)  IV       LOS: 5 days    Time spent: 35 mins     Wyvonnia Dusky, MD [facility] Pager [phone]  If 7PM-7AM, please contact night-coverage 03/31/2020, 12:52 PM

## 2020-04-01 DIAGNOSIS — D649 Anemia, unspecified: Secondary | ICD-10-CM | POA: Diagnosis not present

## 2020-04-01 LAB — GLUCOSE, CAPILLARY
Glucose-Capillary: 131 mg/dL — ABNORMAL HIGH (ref 70–99)
Glucose-Capillary: 153 mg/dL — ABNORMAL HIGH (ref 70–99)
Glucose-Capillary: 81 mg/dL (ref 70–99)

## 2020-04-01 LAB — CBC
HCT: 30.6 % — ABNORMAL LOW (ref 36.0–46.0)
Hemoglobin: 9 g/dL — ABNORMAL LOW (ref 12.0–15.0)
MCH: 25.1 pg — ABNORMAL LOW (ref 26.0–34.0)
MCHC: 29.4 g/dL — ABNORMAL LOW (ref 30.0–36.0)
MCV: 85.5 fL (ref 80.0–100.0)
Platelets: 318 10*3/uL (ref 150–400)
RBC: 3.58 MIL/uL — ABNORMAL LOW (ref 3.87–5.11)
RDW: 27 % — ABNORMAL HIGH (ref 11.5–15.5)
WBC: 11.9 10*3/uL — ABNORMAL HIGH (ref 4.0–10.5)
nRBC: 0 % (ref 0.0–0.2)

## 2020-04-01 LAB — BODY FLUID CULTURE W GRAM STAIN: Culture: NO GROWTH

## 2020-04-01 LAB — BASIC METABOLIC PANEL
Anion gap: 8 (ref 5–15)
BUN: 26 mg/dL — ABNORMAL HIGH (ref 8–23)
CO2: 27 mmol/L (ref 22–32)
Calcium: 8.7 mg/dL — ABNORMAL LOW (ref 8.9–10.3)
Chloride: 104 mmol/L (ref 98–111)
Creatinine, Ser: 1.06 mg/dL — ABNORMAL HIGH (ref 0.44–1.00)
GFR, Estimated: 50 mL/min — ABNORMAL LOW (ref >60)
Glucose, Bld: 110 mg/dL — ABNORMAL HIGH (ref 70–99)
Potassium: 4 mmol/L (ref 3.5–5.1)
Sodium: 139 mmol/L (ref 135–145)

## 2020-04-01 LAB — CYTOLOGY - NON PAP

## 2020-04-01 MED ORDER — AZITHROMYCIN 500 MG PO TABS: 500.0000 mg | ORAL_TABLET | Freq: Every day | ORAL | Status: DC

## 2020-04-01 NOTE — Progress Notes (Signed)
PHARMACIST - PHYSICIAN COMMUNICATION DR:   Billie Ruddy CONCERNING: Antibiotic IV to Oral Route Change Policy  RECOMMENDATION: This patient is receiving azithromycin by the intravenous route.  Based on criteria approved by the Pharmacy and Therapeutics Committee, the antibiotic(s) is/are being converted to the equivalent oral dose form(s).   DESCRIPTION: These criteria include:  Patient being treated for a respiratory tract infection, urinary tract infection, cellulitis or clostridium difficile associated diarrhea if on metronidazole  The patient is not neutropenic and does not exhibit a GI malabsorption state  The patient is eating (either orally or via tube) and/or has been taking other orally administered medications for a least 24 hours  The patient is improving clinically and has a Tmax < 100.5  If you have questions about this conversion, please contact the [place], PharmD, BCPS Clinical Pharmacist 04/01/2020 11:20 AM

## 2020-04-01 NOTE — Plan of Care (Signed)

## 2020-04-01 NOTE — Progress Notes (Signed)
PROGRESS NOTE    NASHA DISS  HYI:[id number] DOB: Jan 03, 1930 DOA: 03/25/2020 PCP: [name], MD    Assessment & Plan:   Principal Problem:   Symptomatic anemia Active Problems:   HTN (hypertension)   Permanent atrial fibrillation (HCC)   Hypokalemia   Iron deficiency anemia   HLD (hyperlipidemia)   [name] [name] is a 87 y.o. female with medical history significant of atrial fibrillation on Xarelto, hypertension, hyperlipidemia, hemorrhoids, hearing loss, diverticulosis, iron deficiency anemia, Bell's palsy, anxiety, who presents with fatigue and abnormal labs of hemoglobin 6.7 and potassium 2.7.   Symptomatic anemia  FOBT positive.  Hgb 10.3 -->6.5. Dr. Marius Ditch of GI is consulted.  Since patient is on Xarelto, pt needs to wait 2 days. --s/p 1u pRBC --started on IV PPI BID  --started on IVF, d/c'ed. --EGD showed granular mucosa in the esophagus which was biopsied. Pathology shows neg for goblet cells, dysplasia & malignancy  Plan: --Monitor Hgb, transfuse to keep Hgb >7 --oral PPI BID  Hx of iron def anemia --s/p IV iron 200 mg daily x 3 days --cont oral iron supplement  Acute hypoxic respiratory failure 2/2 Acute on chronic diastolic CHF exacerbation 2/2 volume overload from blood transfusion and IVF --New 4L O2 requirement on 2/26 with respiratory distress.  CXR showed finding suggestive of CHF.  Started on IV lasix 40 mg. --TTE showed grade 2 diastolic dysfunction --s/p IV diuresis Plan: --cont oral lasix 40 mg daily  B/l pleural effusions: large. S/p b/l thoracentesis. Right pleural fluid cx is NGTD & cytology neg for malignancy.  Left pleural fluid cx is NGTD & cytology is still pending. Pro-cal 0.24  CAP, ruled out --d/c abx  Left lung nodule: 4.8 mm on CXR & CT chest showed 5 mm peripheral left upper lobe nodule and rads recs f/u CT in 12 months   HTN (hypertension) --BP intermittently soft --hold home verapamil --cont oral lasix 40 mg  daily  Permanent atrial fibrillation (HCC) on eliquis -Patient is not sure if she is taking digoxin currently, will hold -Continue amiodarone --cont Xarelto  Hypokalemia:  --monitor and replete with oral potassium  HLD (hyperlipidemia) -Pravastatin   DVT prophylaxis: SCD/Compression stockings Code Status: Limited code  Family Communication:  Level of care: Med-Surg Dispo:   The patient is from: home Anticipated d/c is to: home Anticipated d/c date is: tomorrow Patient currently is medically ready to d/c.   Subjective and Interval History:  Pt reported feeling, also cold.  Breathing ok.     Objective: Vitals:   04/01/20 0529 04/01/20 0801 04/01/20 0813 04/01/20 1616  BP: 140/70 (!) 80/65 (!) 122/58 (!) 107/59  Pulse: 77 76  77  Resp: 16 16  16   Temp: 98.5 F (36.9 C) (!) 97.5 F (36.4 C)  98.6 F (37 C)  TempSrc: Oral Oral  Oral  SpO2: 95% 99%  100%  Weight:      Height:        Intake/Output Summary (Last 24 hours) at 04/01/2020 1629 Last data filed at 04/01/2020 1413 Gross per 24 hour  Intake 462.32 ml  Output 250 ml  Net 212.32 ml   Filed Weights   03/30/20 0500 03/31/20 0500 04/01/20 0500  Weight: 49.7 kg 49 kg 53.7 kg    Examination:   Constitutional: NAD, AAOx3 HEENT: conjunctivae and lids normal, EOMI, hard of hearing CV: No cyanosis.   RESP: normal respiratory effort, on RA Extremities: No effusions, edema in BLE SKIN: warm, dry Neuro: II - XII  grossly intact.   Psych: Normal mood and affect.     Data Reviewed: I have personally reviewed following labs and imaging studies  CBC: Recent Labs  Lab 03/28/20 0604 03/29/20 0542 03/30/20 0619 03/31/20 0549 04/01/20 0509  WBC 12.6* 12.1* 13.1* 12.9* 11.9*  HGB 9.4* 9.3* 9.1* 9.1* 9.0*  HCT 31.4* 31.5* 31.6* 31.2* 30.6*  MCV 81.3 82.7 84.3 84.8 85.5  PLT 332 323 326 317 169   Basic Metabolic Panel: Recent Labs  Lab 03/27/20 0510 03/28/20 0604 03/29/20 0542 03/30/20 0619  03/31/20 0549 04/01/20 0509  NA 140 140 139 140 135 139  K 3.2* 4.1 4.1 5.0 4.0 4.0  CL 105 105 104 103 99 104  CO2 26 27 24 29 26 27   GLUCOSE 83 87 104* 105* 95 110*  BUN 13 13 16 15 23  26*  CREATININE 0.75 0.73 0.72 0.71 0.95 1.06*  CALCIUM 8.6* 9.1 9.0 9.1 8.9 8.7*  MG 2.2 2.3 2.4 2.5* 2.3  --    GFR: Estimated Creatinine Clearance: 29.8 mL/min (A) (by C-G formula based on SCr of 1.06 mg/dL (H)). Liver Function Tests: No results for input(s): AST, ALT, ALKPHOS, BILITOT, PROT, ALBUMIN in the last 168 hours. No results for input(s): LIPASE, AMYLASE in the last 168 hours. No results for input(s): AMMONIA in the last 168 hours. Coagulation Profile: No results for input(s): INR, PROTIME in the last 168 hours. Cardiac Enzymes: No results for input(s): CKTOTAL, CKMB, CKMBINDEX, TROPONINI in the last 168 hours. BNP (last 3 results) No results for input(s): PROBNP in the last 8760 hours. HbA1C: No results for input(s): HGBA1C in the last 72 hours. CBG: Recent Labs  Lab 03/31/20 1234 03/31/20 1645 04/01/20 0806 04/01/20 1151 04/01/20 1528  GLUCAP 167* 152* 131* 153* 81   Lipid Profile: No results for input(s): CHOL, HDL, LDLCALC, TRIG, CHOLHDL, LDLDIRECT in the last 72 hours. Thyroid Function Tests: No results for input(s): TSH, T4TOTAL, FREET4, T3FREE, THYROIDAB in the last 72 hours. Anemia Panel: No results for input(s): VITAMINB12, FOLATE, FERRITIN, TIBC, IRON, RETICCTPCT in the last 72 hours. Sepsis Labs: Recent Labs  Lab 03/29/20 0542  PROCALCITON 0.24    Recent Results (from the past 240 hour(s))  Resp Panel by RT-PCR (Flu A&B, Covid) Nasopharyngeal Swab     Status: None   Collection Time: 03/25/20 11:33 AM   Specimen: Nasopharyngeal Swab; Nasopharyngeal(NP) swabs in vial transport medium  Result Value Ref Range Status   SARS Coronavirus 2 by RT PCR NEGATIVE NEGATIVE Final    Comment: (NOTE) SARS-CoV-2 target nucleic acids are NOT DETECTED.  The SARS-CoV-2  RNA is generally detectable in upper respiratory specimens during the acute phase of infection. The lowest concentration of SARS-CoV-2 viral copies this assay can detect is 138 copies/mL. A negative result does not preclude SARS-Cov-2 infection and should not be used as the sole basis for treatment or other patient management decisions. A negative result may occur with  improper specimen collection/handling, submission of specimen other than nasopharyngeal swab, presence of viral mutation(s) within the areas targeted by this assay, and inadequate number of viral copies(<138 copies/mL). A negative result must be combined with clinical observations, patient history, and epidemiological information. The expected result is Negative.  Fact Sheet for Patients:  EntrepreneurPulse.com.au  Fact Sheet for Healthcare Providers:  IncredibleEmployment.be  This test is no t yet approved or cleared by the [place] FDA and  has been authorized for detection and/or diagnosis of SARS-CoV-2 by FDA under an Emergency Use Authorization (EUA).  This EUA will remain  in effect (meaning this test can be used) for the duration of the COVID-19 declaration under Section 564(b)(1) of the Act, 21 U.S.C.section 360bbb-3(b)(1), unless the authorization is terminated  or revoked sooner.       Influenza A by PCR NEGATIVE NEGATIVE Final   Influenza B by PCR NEGATIVE NEGATIVE Final    Comment: (NOTE) The Xpert Xpress SARS-CoV-2/FLU/RSV plus assay is intended as an aid in the diagnosis of influenza from Nasopharyngeal swab specimens and should not be used as a sole basis for treatment. Nasal washings and aspirates are unacceptable for Xpert Xpress SARS-CoV-2/FLU/RSV testing.  Fact Sheet for Patients: EntrepreneurPulse.com.au  Fact Sheet for Healthcare Providers: IncredibleEmployment.be  This test is not yet approved or cleared by the  [place] FDA and has been authorized for detection and/or diagnosis of SARS-CoV-2 by FDA under an Emergency Use Authorization (EUA). This EUA will remain in effect (meaning this test can be used) for the duration of the COVID-19 declaration under Section 564(b)(1) of the Act, 21 U.S.C. section 360bbb-3(b)(1), unless the authorization is terminated or revoked.  Performed at [facility], [address] [place], [place] [address]   MRSA PCR Screening     Status: Abnormal   Collection Time: 03/28/20  6:18 PM  Result Value Ref Range Status   MRSA by PCR POSITIVE (A) NEGATIVE Final    Comment:        The GeneXpert MRSA Assay (FDA approved for NASAL specimens only), is one component of a comprehensive MRSA colonization surveillance program. It is not intended to diagnose MRSA infection nor to guide or monitor treatment for MRSA infections. RESULT CALLED TO, READ BACK BY AND VERIFIED WITH: Earlie Server 03/28/20 AT 1928 BY ACR Performed at [facility], [address] [place], [place] [address]   Acid Fast Smear (AFB)     Status: None   Collection Time: 03/29/20 11:38 AM   Specimen: PATH Cytology Pleural fluid  Result Value Ref Range Status   AFB Specimen Processing Concentration  Final   Acid Fast Smear Negative  Final    Comment: (NOTE) Performed At: [facility] [place], [place] [id number] Rush Farmer MD VC:[id number]    Source (AFB) PLEURAL  Final    Comment: Performed at [facility], [address] [place], [place] [address]  Body fluid culture w Gram Stain     Status: None   Collection Time: 03/29/20 11:38 AM   Specimen: PATH Cytology Pleural fluid  Result Value Ref Range Status   Specimen Description   Final    PLEURAL Performed at [facility], [address] [place], [place] [address]    Special Requests   Final    PLEURAL Performed at [facility], [address]  [place], [place] [address]    Gram Stain   Final    MODERATE WBC PRESENT, PREDOMINANTLY MONONUCLEAR NO ORGANISMS SEEN    Culture   Final    NO GROWTH 3 DAYS Performed at [facility], [place] [address] [place], [place] [address]    Report Status 04/01/2020 FINAL  Final  Body fluid culture w Gram Stain     Status: None (Preliminary result)   Collection Time: 03/30/20  3:34 PM   Specimen: PATH Cytology Pleural fluid  Result Value Ref Range Status   Specimen Description   Final    PLEURAL Performed at [facility], [address], [place], [place] [address]  Special Requests   Final    NONE Performed at [facility], [address] [place], [place] [address]    Gram Stain   Final    WBC PRESENT, PREDOMINANTLY MONONUCLEAR NO ORGANISMS SEEN    Culture   Final    NO GROWTH 2 DAYS Performed at [facility], [place] [address] [place], [place] [address]    Report Status PENDING  Incomplete      Radiology Studies: DG Chest Port 1 View  Result Date: 03/31/2020 CLINICAL DATA:  Shortness of breath. EXAM: PORTABLE CHEST 1 VIEW COMPARISON:  03/30/2020. FINDINGS: Mediastinum and hilar structures normal. Mild left base infiltrate noted on today's exam. Tiny left pleural effusion again noted. Mild right base atelectasis. No pneumothorax. Heart size normal. No acute bony abnormality. IMPRESSION: 1. Mild left base infiltrate noted today's exam. Small left pleural effusion again noted. 2.  Mild right base atelectasis. Electronically Signed   By: Dix   On: 03/31/2020 11:14     Scheduled Meds: . amiodarone  200 mg Oral Daily  . B-complex with vitamin C  1 tablet Oral Daily  . Chlorhexidine Gluconate Cloth  6 each Topical Q0600  . cholecalciferol  1,000 Units Oral Daily  . furosemide  40 mg Oral Daily  . iron polysaccharides  150 mg Oral Daily  . melatonin  5 mg Oral QHS  . mupirocin ointment  1 application Nasal BID  . pantoprazole  40 mg Oral BID AC   . polyethylene glycol  34 g Oral BID  . pravastatin  40 mg Oral q1800  . rivaroxaban  15 mg Oral Q supper  . verapamil  180 mg Oral Daily   Continuous Infusions: . sodium chloride Stopped (03/28/20 2109)     LOS: 6 days     Enzo Bi, MD [facility] If 7PM-7AM, please contact night-coverage 04/01/2020, 4:29 PM

## 2020-04-02 DIAGNOSIS — D649 Anemia, unspecified: Secondary | ICD-10-CM | POA: Diagnosis not present

## 2020-04-02 LAB — BASIC METABOLIC PANEL
Anion gap: 8 (ref 5–15)
BUN: 20 mg/dL (ref 8–23)
CO2: 27 mmol/L (ref 22–32)
Calcium: 9.1 mg/dL (ref 8.9–10.3)
Chloride: 101 mmol/L (ref 98–111)
Creatinine, Ser: 0.71 mg/dL (ref 0.44–1.00)
GFR, Estimated: >60 mL/min (ref >60)
Glucose, Bld: 116 mg/dL — ABNORMAL HIGH (ref 70–99)
Potassium: 4.9 mmol/L (ref 3.5–5.1)
Sodium: 136 mmol/L (ref 135–145)

## 2020-04-02 LAB — CBC
HCT: 31.6 % — ABNORMAL LOW (ref 36.0–46.0)
Hemoglobin: 9.4 g/dL — ABNORMAL LOW (ref 12.0–15.0)
MCH: 25.8 pg — ABNORMAL LOW (ref 26.0–34.0)
MCHC: 29.7 g/dL — ABNORMAL LOW (ref 30.0–36.0)
MCV: 86.6 fL (ref 80.0–100.0)
Platelets: 308 10*3/uL (ref 150–400)
RBC: 3.65 MIL/uL — ABNORMAL LOW (ref 3.87–5.11)
RDW: 27.4 % — ABNORMAL HIGH (ref 11.5–15.5)
WBC: 8.8 10*3/uL (ref 4.0–10.5)
nRBC: 0 % (ref 0.0–0.2)

## 2020-04-02 LAB — MAGNESIUM: Magnesium: 2.3 mg/dL (ref 1.7–2.4)

## 2020-04-02 LAB — GLUCOSE, CAPILLARY: Glucose-Capillary: 103 mg/dL — ABNORMAL HIGH (ref 70–99)

## 2020-04-02 MED ORDER — ACETAMINOPHEN 500 MG PO TABS: 500.0000 mg | ORAL_TABLET | Freq: Four times a day (QID) | ORAL | 0 refills | Status: DC | PRN

## 2020-04-02 MED ORDER — PANTOPRAZOLE SODIUM 40 MG PO TBEC: 40.0000 mg | DELAYED_RELEASE_TABLET | Freq: Two times a day (BID) | ORAL | 2 refills | Status: DC

## 2020-04-02 NOTE — Discharge Summary (Signed)
Physician Discharge Summary   EMMALEAH MERONEY  female DOB: 04/11/1929  JSH:[id number]  PCP: [name], MD  Admit date: 03/25/2020 Discharge date: 04/02/2020  Admitted From: home Disposition:  home Home Health: Yes CODE STATUS: Limited code  Discharge Instructions    Discharge instructions   Complete by: As directed    You had an endoscopy of your stomach.  GI doctor wants you to take Protonix twice a day until you follow up with Dr. Marius Ditch in outpatient clinic.  You are anemic and deficient in iron.  Please continue to take your over-the-counter iron supplements.   Dr. Enzo Bi - -      70 Day Unplanned Readmission Risk Score   Flowsheet [facility] to Hosp-Admission (Current) from 03/25/2020 in [facility] (1C)  30 Day Unplanned Readmission Risk Score (%) 19.92 Filed at 04/02/2020 0800     This score is the patient's risk of an unplanned readmission within 30 days of being discharged (0 -100%). The score is based on dignosis, age, lab data, medications, orders, and past utilization.   Low:  0-14.9   Medium: 15-21.9   High: 22-29.9   Extreme: 30 and above         Hospital Course:  For full details, please see H&P, progress notes, consult notes and ancillary notes.  Briefly,  Joely H Fairclothis a 87 y.o.femalewith medical history significant ofatrial fibrillation on Xarelto, hypertension, hyperlipidemia, hemorrhoids, hearing loss, diverticulosis, iron deficiency anemia, Bell's palsy, anxiety, who presents with fatigue and abnormal labs of hemoglobin 6.7 and potassium 2.7.   Symptomatic anemia Hgb 10.3 -->6.5.  Pt received 1u pRBC and started on IV PPI BID.  Dr. Marius Ditch of GI consulted. Since patient is on Xarelto, pt needed washout before EGD. --EGD showed granular mucosa in the esophagus which was biopsied. Pathology shows neg for goblet cells, dysplasia &malignancy.  Pt was discharged on oral PPI BID and will follow up with  Dr. Marius Ditch in outpatient clinic.  Chronic iron def anemia Iron 22, sat ratio 6%.  Pt received IV iron 200 mg daily x 3 days and discharged on oral iron supplement.  Acute hypoxic respiratory failure 2/2 Acute on chronic diastolic CHF exacerbation 2/2 volume overload from blood transfusion and IVF --New 4L O2 requirement on 2/26 with respiratory distress.  CXR showed finding suggestive of CHF.  Started on IV lasix 40 mg. --TTE showed grade 2 diastolic dysfunction.  Pt was weaned to room air prior to discharge.  Pt was transitioned to home oral lasix 40 mg daily prior to discharge.  B/l pleural effusions S/p b/l thoracentesis, 600 ml from the right, and 550 ml from the left.  Right pleural fluid cx is NGTD & cytologyneg for malignancy. Left pleural fluid cx is NGTD &cytology showed LYMPHOCYTE PREDOMINANT EFFUSION.  CAP, ruled out Procal 0.24.  No fever, no cough.  abx d/c'ed.    Left lung nodule:  4.8 mm on CXR &CT chest showed 5 mm peripheral left upper lobe nodule and rads recs f/u CT in 12 months   HTN (hypertension) --BP intermittently soft. home verapamil held during hospitalization and resumed at discharge.   --cont oral lasix 40 mg daily  Permanent atrial fibrillation (HCC) on eliquis -Continue amiodarone and digoxin.   --cont Xarelto  Hypokalemia:  --monitor and replete with oral potassium  HLD (hyperlipidemia) -Pravastatin   Discharge Diagnoses:  Principal Problem:   Symptomatic anemia Active Problems:   HTN (hypertension)   Permanent atrial fibrillation (HCC)  Hypokalemia   Iron deficiency anemia   HLD (hyperlipidemia)    Discharge Instructions:  Allergies as of 04/02/2020      Reactions   Bactrim [sulfamethoxazole-trimethoprim] Other (See Comments)   Hearing loss   Ciprofloxacin Other (See Comments)   Flu-like symptoms   Pneumococcal Vaccines Other (See Comments)   Flu-like symptoms      Medication List    STOP taking these medications    ascorbic acid 1000 MG tablet Commonly known as: VITAMIN C   folic acid 517 MCG tablet Commonly known as: FOLVITE   GLUCOSAMINE PO   polymixin-bacitracin 500-10000 UNIT/GM Oint ointment   potassium chloride SA 20 MEQ tablet Commonly known as: KLOR-CON   Santyl ointment Generic drug: collagenase   selenium 50 MCG Tabs tablet   tretinoin 0.05 % cream Commonly known as: RETIN-A     TAKE these medications   acetaminophen 500 MG tablet Commonly known as: TYLENOL Take 1 tablet (500 mg total) by mouth every 6 (six) hours as needed. What changed:   how much to take  when to take this  reasons to take this   amiodarone 200 MG tablet Commonly known as: PACERONE Take 1 tablet (200 mg total) by mouth daily.   B-complex with vitamin C tablet Take 1 tablet by mouth daily.   calcium carbonate 1250 MG capsule Take 2 capsules by mouth 2 (two) times daily with a meal.   Cholecalciferol 25 MCG (1000 UT) tablet Take 1 tablet by mouth in the morning and at bedtime.   Cholecalciferol 25 MCG (1000 UT) tablet Take by mouth.   Digoxin 62.5 MCG Tabs Take 0.0625 mg by mouth daily.   furosemide 40 MG tablet Commonly known as: LASIX Take 1 tablet (40 mg total) by mouth daily.   iron polysaccharides 150 MG capsule Commonly known as: NIFEREX Take 1 capsule (150 mg total) by mouth daily.   lovastatin 40 MG tablet Commonly known as: MEVACOR Take 40 mg by mouth at bedtime.   lovastatin 40 MG tablet Commonly known as: MEVACOR Take 1 tablet by mouth at bedtime.   metoprolol tartrate 50 MG tablet Commonly known as: LOPRESSOR Take 1 tablet (50 mg total) by mouth 2 (two) times daily.   metoprolol tartrate 50 MG tablet Commonly known as: LOPRESSOR Take 1 tablet by mouth 2 (two) times daily.   pantoprazole 40 MG tablet Commonly known as: PROTONIX Take 1 tablet (40 mg total) by mouth 2 (two) times daily before a meal.   polyethylene glycol powder 17 GM/SCOOP powder Commonly  known as: GLYCOLAX/MIRALAX Take by mouth.   Rivaroxaban 15 MG Tabs tablet Commonly known as: XARELTO Take 1 tablet (15 mg total) by mouth daily with supper.   verapamil 180 MG CR tablet Commonly known as: CALAN-SR Take 180 mg by mouth daily.            Durable Medical Equipment  (From admission, onward)         Start     Ordered   03/31/20 0741  For home use only DME 3 n 1  Once        03/31/20 0740           Follow-up Information    Vanga, Tally Due, MD. Schedule an appointment as soon as possible for a visit in 1 month(s).   Specialty: Gastroenterology Contact information: [place] [address] [phone]        [name], MD. Schedule an appointment as soon as possible  for a visit in 1 week(s).   Specialty: Family Medicine Contact information: [address] [place] [address] [phone]               Allergies  Allergen Reactions  . Bactrim [Sulfamethoxazole-Trimethoprim] Other (See Comments)    Hearing loss  . Ciprofloxacin Other (See Comments)    Flu-like symptoms  . Pneumococcal Vaccines Other (See Comments)    Flu-like symptoms     The results of significant diagnostics from this hospitalization (including imaging, microbiology, ancillary and laboratory) are listed below for reference.   Consultations:   Procedures/Studies: CT CHEST W CONTRAST  Result Date: 03/28/2020 CLINICAL DATA:  Lung nodule. EXAM: CT CHEST WITH CONTRAST TECHNIQUE: Multidetector CT imaging of the chest was performed during intravenous contrast administration. CONTRAST:  17mL OMNIPAQUE IOHEXOL 300 MG/ML  SOLN COMPARISON:  Chest radiograph 03/28/2020. FINDINGS: Cardiovascular: Atherosclerotic calcification of the aorta, aortic valve and coronary arteries. Heart is enlarged. No pericardial effusion. Mediastinum/Nodes: Thyroid is heterogeneous and contains low-attenuation nodules measuring up to 7 mm. No follow-up recommended  (ref: J Am Coll Radiol. 2015 Feb;12(2): 143-50).No pathologically enlarged mediastinal, hilar or axillary lymph nodes. Esophagus is grossly unremarkable. Lungs/Pleura: Image quality is degraded by respiratory motion. Centrilobular emphysema. Large bilateral pleural effusions with collapse/consolidation in the adjacent lower lobes. 5 mm peripheral left upper lobe nodule (3/39). Airway is unremarkable. Upper Abdomen: Low-attenuation lesions in the liver measure up to 2.2 cm and are likely cysts. Liver may be slightly decreased in attenuation diffusely and the margin is slightly irregular. Visualized portions of the liver, gallbladder and right adrenal gland are otherwise unremarkable. Nodular thickening of the left adrenal gland. Subcentimeter low-attenuation lesions in the kidneys are too small to characterize but statistically, cysts are likely. Visualized portions of the spleen, pancreas, stomach and bowel are unremarkable with the exception of a small hiatal hernia. No upper abdominal adenopathy. Musculoskeletal: Degenerative changes in the spine. No worrisome lytic or sclerotic lesions. Old rib fractures. IMPRESSION: 1. Large bilateral pleural effusions with collapse/consolidation in both lower lobes, difficult to exclude pneumonia. 2. 5 mm peripheral left upper lobe nodule. No follow-up needed if patient is low-risk. Non-contrast chest CT can be considered in 12 months if patient is high-risk and aching in the consideration patient's age. This recommendation follows the consensus statement: Guidelines for Management of Incidental Pulmonary Nodules Detected on CT Images: From the Fleischner Society 2017; Radiology 2017; 284:228-243. 3. Hepatic steatosis and probable cirrhosis. 4. Aortic atherosclerosis (ICD10-I70.0). Coronary artery calcification. 5.  Emphysema (ICD10-J43.9). Electronically Signed   By: Lorin Picket M.D.   On: 03/28/2020 15:23   DG Chest Port 1 View  Result Date: 03/31/2020 CLINICAL DATA:   Shortness of breath. EXAM: PORTABLE CHEST 1 VIEW COMPARISON:  03/30/2020. FINDINGS: Mediastinum and hilar structures normal. Mild left base infiltrate noted on today's exam. Tiny left pleural effusion again noted. Mild right base atelectasis. No pneumothorax. Heart size normal. No acute bony abnormality. IMPRESSION: 1. Mild left base infiltrate noted today's exam. Small left pleural effusion again noted. 2.  Mild right base atelectasis. Electronically Signed   By: Marcello Moores  Register   On: 03/31/2020 11:14   DG Chest Port 1 View  Result Date: 03/30/2020 CLINICAL DATA:  Status post left thoracentesis. EXAM: PORTABLE CHEST 1 VIEW COMPARISON:  03/29/2020 FINDINGS: The heart size and mediastinal contours are within normal limits. After left thoracentesis there is no further discernible pleural fluid on the left. There is significant improved aeration of the left lower lung. No  pneumothorax identified. Stable bilateral pulmonary interstitial prominence. No right pleural fluid. The visualized skeletal structures are unremarkable. IMPRESSION: No further discernible left pleural fluid after thoracentesis. No pneumothorax. Improved aeration of the left lower lung. Electronically Signed   By: Aletta Edouard M.D.   On: 03/30/2020 16:18   DG Chest Port 1 View  Result Date: 03/29/2020 CLINICAL DATA:  Status post right thoracentesis. EXAM: PORTABLE CHEST 1 VIEW COMPARISON:  03/28/2020 FINDINGS: Stable heart size. After right thoracentesis there is very little residual right pleural fluid. No pneumothorax. Stable moderate left pleural effusion remains. No overt pulmonary edema. IMPRESSION: No pneumothorax after right thoracentesis. Very little residual right pleural fluid present. Stable left pleural effusion. Electronically Signed   By: Aletta Edouard M.D.   On: 03/29/2020 12:26   DG CHEST PORT 1 VIEW  Result Date: 03/28/2020 CLINICAL DATA:  Pulmonary edema EXAM: PORTABLE CHEST 1 VIEW COMPARISON:  03/26/2020 FINDINGS:  Heart size within normal limits. Mild pulmonary vascular congestion has improved since prior examination. Interval improvement of bilateral pleural effusions and pulmonary edema. 4.8 mm nodule noted in the left lateral upper lung projecting just below the anterior segment of the left second rib. IMPRESSION: 1. Improving pulmonary edema. 2. 4.8 mm left upper lung nodule should be further evaluated with nonemergent contrast enhanced chest CT when patient condition allows. 3. These results will be called to the ordering clinician or representative by the Radiologist Assistant, and communication documented in the PACS or [employer]. Electronically Signed   By: Miachel Roux M.D.   On: 03/28/2020 08:07   DG Chest Port 1 View  Result Date: 03/26/2020 CLINICAL DATA:  87 year old female with history of respiratory failure and hypoxia. EXAM: PORTABLE CHEST 1 VIEW COMPARISON:  Chest x-ray 01/15/2020. FINDINGS: There is cephalization of the pulmonary vasculature, indistinctness of the interstitial markings, and patchy airspace disease throughout the lungs bilaterally suggestive of moderate pulmonary edema. Moderate to large bilateral pleural effusions. Cardiopericardial silhouette is partially obscured but appears enlarged. Upper mediastinal contours are within normal limits. Aortic atherosclerosis. IMPRESSION: 1. The appearance the chest suggests congestive heart failure, as above. 2. Moderate enlargement of the cardiopericardial silhouette, which appears significantly worsened compared to the prior chest x-ray from January 15, 2020. This could reflect underlying cardiomegaly, however, clinical correlation for signs and symptoms of pericardial effusion is suggested. 3. Aortic atherosclerosis. Electronically Signed   By: Vinnie Langton M.D.   On: 03/26/2020 12:57   ECHOCARDIOGRAM COMPLETE  Result Date: 03/27/2020    ECHOCARDIOGRAM REPORT   Patient Name:   BESAN KETCHEM Date of Exam: 03/27/2020 Medical Rec  #:  [id number]           Height:       63.0 in Accession #:    [id number]          Weight:       119.5 lb Date of Birth:  1929-02-12           BSA:          1.553 m Patient Age:    8 years            BP:           124/66 mmHg Patient Gender: F                   HR:           80 bpm. Exam Location:  [facility] Procedure: 2D Echo Indications:     Acute Respiratory Distress R06.03  History:         Patient has no prior history of Echocardiogram examinations.  Sonographer:     Arville Go RDCS Referring Phys:  [id number] [place] Diagnosing Phys: [name] MD IMPRESSIONS  1. Left ventricular ejection fraction, by estimation, is 60 to 65%. The left ventricle has normal function. The left ventricle demonstrates regional wall motion abnormalities (see scoring diagram/findings for description). There is mild left ventricular  hypertrophy. Left ventricular diastolic parameters are consistent with Grade II diastolic dysfunction (pseudonormalization).  2. Right ventricular systolic function is normal. The right ventricular size is normal.  3. Moderate pleural effusion in the left lateral region.  4. The mitral valve is myxomatous. Moderate to severe mitral valve regurgitation. Moderate mitral annular calcification.  5. The aortic valve is normal in structure. Aortic valve regurgitation is trivial. FINDINGS  Left Ventricle: Left ventricular ejection fraction, by estimation, is 60 to 65%. The left ventricle has normal function. The left ventricle demonstrates regional wall motion abnormalities. The left ventricular internal cavity size was normal in size. There is mild left ventricular hypertrophy. Left ventricular diastolic parameters are consistent with Grade II diastolic dysfunction (pseudonormalization). Right Ventricle: The right ventricular size is normal. No increase in right ventricular wall thickness. Right ventricular systolic function is normal. Left Atrium: Left atrial size was normal in size. Right Atrium: Right  atrial size was normal in size. Pericardium: There is no evidence of pericardial effusion. Mitral Valve: The mitral valve is myxomatous. There is severe thickening of the anterior mitral valve leaflet(s). There is severe calcification of the anterior mitral valve leaflet(s). Normal mobility of the mitral valve leaflets. Moderate mitral annular  calcification. Moderate to severe mitral valve regurgitation. Tricuspid Valve: The tricuspid valve is normal in structure. Tricuspid valve regurgitation is mild. Aortic Valve: The aortic valve is normal in structure. Aortic valve regurgitation is trivial. Aortic regurgitation PHT measures 423 msec. Aortic valve peak gradient measures 8.9 mmHg. Pulmonic Valve: The pulmonic valve was grossly normal. Pulmonic valve regurgitation is not visualized. Aorta: The ascending aorta was not well visualized. IAS/Shunts: No atrial level shunt detected by color flow Doppler. Additional Comments: There is a moderate pleural effusion in the left lateral region.  LEFT VENTRICLE PLAX 2D LVIDd:         4.75 cm  Diastology LVIDs:         3.18 cm  LV e' medial:   5.44 cm/s LV PW:         0.94 cm  LV E/e' medial: 17.8 LV IVS:        0.94 cm LVOT diam:     1.80 cm LV SV:         48 LV SV Index:   31 LVOT Area:     2.54 cm  RIGHT VENTRICLE RV Basal diam:  2.19 cm RV S prime:     13.50 cm/s TAPSE (M-mode): 2.0 cm LEFT ATRIUM             Index       RIGHT ATRIUM           Index LA diam:        3.10 cm 2.00 cm/m  RA Area:     13.50 cm LA Vol (A2C):   72.1 ml 46.41 ml/m RA Volume:   32.70 ml  21.05 ml/m LA Vol (A4C):   50.8 ml 32.70 ml/m LA Biplane Vol: 61.0 ml 39.27 ml/m  AORTIC VALVE  PULMONIC VALVE AV Area (Vmax): 1.47 cm    PV Vmax:       1.09 m/s AV Vmax:        149.00 cm/s PV Peak grad:  4.8 mmHg AV Peak Grad:   8.9 mmHg LVOT Vmax:      86.20 cm/s LVOT Vmean:     50.600 cm/s LVOT VTI:       0.188 m AI PHT:         423 msec  AORTA Ao Root diam: 2.80 cm Ao Asc diam:  2.80 cm  MITRAL VALVE                TRICUSPID VALVE MV Area (PHT): 4.71 cm     TV Peak grad:   35.8 mmHg MV Decel Time: 161 msec     TV Vmax:        2.99 m/s MV E velocity: 96.80 cm/s MV A velocity: 106.00 cm/s  SHUNTS MV E/A ratio:  0.91         Systemic VTI:  0.19 m                             Systemic Diam: 1.80 cm [name] MD Electronically signed by [name] MD Signature Date/Time: 03/27/2020/3:06:22 PM    Final    CT Angio Abd/Pel W and/or Wo Contrast  Result Date: 03/25/2020 CLINICAL DATA:  Fatigue.  Anemia.  Evaluate for GI bleeding. EXAM: CTA ABDOMEN AND PELVIS WITHOUT AND WITH CONTRAST TECHNIQUE: Multidetector CT imaging of the abdomen and pelvis was performed using the standard protocol during bolus administration of intravenous contrast. Multiplanar reconstructed images and MIPs were obtained and reviewed to evaluate the vascular anatomy. CONTRAST:  37mL OMNIPAQUE IOHEXOL 350 MG/ML SOLN COMPARISON:  CT abdomen pelvis-08/14/2015 FINDINGS: VASCULAR Aorta: Calcified atherosclerotic plaque within normal caliber abdominal aorta. No evidence of abdominal aortic dissection or periaortic stranding this nongated examination. Celiac: There is a minimal amount of calcified atherosclerotic plaque involving the origin the celiac artery, not resulting in a hemodynamically significant narrowing. SMA: Widely patent without hemodynamically significant narrowing. Conventional branching pattern. The distal tributaries the SMA appear widely patent without discrete lumen filling defect to suggest distal embolism. Renals: There is a tiny accessory left renal artery supplies the inferior pole of the left kidney. Solitary right-sided renal artery. There is a minimal amount of calcified atherosclerotic plaque involving the origin of the bilateral dominant renal arteries, not resulting in a hemodynamically significant stenosis. No vessel irregularity to suggest FMD. IMA: Remains patent. Inflow: Eccentric calcified  atherosclerotic plaque involving the proximal aspect of the right common iliac artery approaches 50% luminal narrowing (axial image 131, series 5; coronal image 35, series 9). The left common iliac artery is mildly disease though patent and of normal caliber. The bilateral internal iliac arteries are diseased though patent and of normal caliber. The bilateral external iliac arteries are tortuous though of normal caliber and widely patent without a hemodynamically significant narrowing. Proximal Outflow: There is a minimal amount of eccentric calcified atherosclerotic plaque involving the bilateral common femoral arteries, not resulting in hemodynamically significant stenosis. The imaged portions of the bilateral deep and superficial femoral arteries are widely patent without a hemodynamically significant narrowing. Veins: The IVC and pelvic venous systems appear widely patent. Review of the MIP images confirms the above findings. _________________________________________________________ NON-VASCULAR Lower chest: Limited visualization of lower thorax demonstrates small to moderate-sized bilateral pleural effusions with associated subsegmental  consolidative opacities and air bronchograms and associated partial atelectasis of the bilateral lower lobes. Cardiomegaly. There is diffuse decreased attenuation of the intra cardiac blood pool suggestive of anemia. Coronary artery calcifications. No pericardial effusion. Hepatobiliary: Normal hepatic contour. Redemonstrated multiple hypoattenuating nonenhancing hepatic cysts including dominant approximately 2.1 cm cyst within the dome of the left lobe of the liver. Note is made of a punctate granuloma within the anterior segment of the right lobe of the liver (image 24, series 2), unchanged. No discrete worrisome hyperenhancing hepatic lesions. Normal appearance of the gallbladder given degree distention. No discrete radiopaque gallstones. No intra or extrahepatic biliary  ductal dilatation. No ascites. Pancreas: Normal appearance of the pancreas. Spleen: Normal appearance of the spleen. Adrenals/Urinary Tract: There is symmetric enhancement of the bilateral kidneys. No evidence of nephrolithiasis. There is a minimal amount of grossly symmetric bilateral likely age and body habitus related perinephric stranding. No urine obstruction. No discrete renal lesions. There is mild thickening of the medial limb and crux of the left adrenal gland without discrete nodule, similar to the 2017 examination. Normal appearance of the right adrenal gland. Normal appearance of the urinary bladder. Stomach/Bowel: No discrete areas of intraluminal contrast extravasation to suggest the etiology of clinical suspicion of GI bleeding. Moderate to large colonic stool burden without evidence of enteric obstruction. Scattered radiopaque pill fragments are seen throughout the small intestine. Colonic diverticulosis without evidence of superimposed acute diverticulitis. Small hiatal hernia. There is mild diffuse thickening the stomach wall, potentially accentuated due to underdistention. Otherwise, there are no discrete areas of bowel wall thickening. Normal appearance of the terminal ileum. The appendix is not visualized, however there is no pericecal inflammatory change. No pneumoperitoneum, pneumatosis or portal venous gas. Lymphatic: No bulky retroperitoneal, mesenteric, pelvic or inguinal lymphadenopathy. Reproductive: Suspected degenerating fibroid within the left side of the uterine fundus. Otherwise, normal appearance of the pelvic organs for age. No free fluid the pelvic cul-de-sac. Other: Minimal amount of subcutaneous edema about the midline of the low back. Musculoskeletal: No acute or aggressive osseous abnormalities. Post intramedullary fixation of the right femur with dynamic screw fixation of the right femoral neck. Mild-to-moderate multilevel lumbar spine DDD worse at L4-L5 and L5-S1.  IMPRESSION: Vascular Impression: 1. No discrete areas of intraluminal contrast extravasation to suggest the source of clinical suspicion of GI bleeding. 2. Scattered atherosclerotic plaque within normal caliber abdominal aorta. Aortic Atherosclerosis (ICD10-I70.0). 3. Cardiomegaly.  Coronary artery calcifications. 4. Suspected hemodynamically significant narrowing involving the right common iliac artery. Correlation for symptoms of right lower extremity PAD is advised. Nonvascular Impression: 1. Diffuse thickening of the stomach wall, nonspecific and while potentially accentuated due to underdistention, could be seen in the setting of a gastritis. Further evaluation with upper endoscopy could be performed as indicated. 2. Small to moderate-sized bilateral effusions with associated partial atelectasis of the bilateral lower lobes. 3. Large colonic stool burden without evidence of enteric obstruction. 4. Colonic diverticulosis without evidence of superimposed acute diverticulitis. 5. Small hiatal hernia. Electronically Signed   By: Sandi Mariscal M.D.   On: 03/25/2020 15:32   US THORACENTESIS ASP PLEURAL SPACE W/IMG GUIDE  Result Date: 03/30/2020 INDICATION: Chronic heart failure. Bilateral pleural effusions. Right thoracentesis performed on 03/29/2020. EXAM: ULTRASOUND GUIDED LEFT THORACENTESIS MEDICATIONS: None. COMPLICATIONS: None immediate. PROCEDURE: An ultrasound guided thoracentesis was thoroughly discussed with the patient and questions answered. The benefits, risks, alternatives and complications were also discussed. The patient understands and wishes to proceed with the procedure. Written consent was obtained. Ultrasound  was performed to localize and mark an adequate pocket of fluid in the left chest. The area was then prepped and draped in the normal sterile fashion. 1% Lidocaine was used for local anesthesia. Under ultrasound guidance a 19 gauge, 7-cm, Yueh catheter was introduced. Thoracentesis was  performed. The catheter was removed and a dressing applied. FINDINGS: A total of approximately 550 mL of straw-colored fluid was removed. IMPRESSION: Successful ultrasound guided left thoracentesis yielding 550 mL of pleural fluid. Electronically Signed   By: Miachel Roux M.D.   On: 03/30/2020 17:17   US THORACENTESIS ASP PLEURAL SPACE W/IMG GUIDE  Result Date: 03/29/2020 INDICATION: Patient with history of chronic heart failure and atrial fibrillation found to have bilateral pleural effusions. Request is for therapeutic and diagnostic thoracentesis EXAM: ULTRASOUND GUIDED RIGHT THERAPEUTIC AND DIAGNOSTIC THORACENTESIS MEDICATIONS: Lidocaine 1% 10 mL COMPLICATIONS: None immediate. PROCEDURE: An ultrasound guided thoracentesis was thoroughly discussed with the patient and questions answered. The benefits, risks, alternatives and complications were also discussed. The patient understands and wishes to proceed with the procedure. Written consent was obtained. Ultrasound was performed to localize and mark an adequate pocket of fluid in the right chest. The area was then prepped and draped in the normal sterile fashion. 1% Lidocaine was used for local anesthesia. Under ultrasound guidance a 6 Fr Safe-T-Centesis catheter was introduced. Thoracentesis was performed. The catheter was removed and a dressing applied. FINDINGS: A total of approximately 600 mL of straw-colored fluid was removed. Samples were sent to the laboratory as requested by the clinical team. IMPRESSION: Successful ultrasound guided therapeutic and diagnostic right-sided thoracentesis yielding 600 mL of pleural fluid. Read by: Rushie Nyhan, NP Electronically Signed   By: Aletta Edouard M.D.   On: 03/29/2020 12:28      Labs: BNP (last 3 results) Recent Labs    01/15/20 0238 03/25/20 1133  BNP 654.7* 425.9*   Basic Metabolic Panel: Recent Labs  Lab 03/28/20 0604 03/29/20 0542 03/30/20 0619 03/31/20 0549 04/01/20 0509  04/02/20 0501  NA 140 139 140 135 139 136  K 4.1 4.1 5.0 4.0 4.0 4.9  CL 105 104 103 99 104 101  CO2 27 24 29 26 27 27   GLUCOSE 87 104* 105* 95 110* 116*  BUN 13 16 15 23  26* 20  CREATININE 0.73 0.72 0.71 0.95 1.06* 0.71  CALCIUM 9.1 9.0 9.1 8.9 8.7* 9.1  MG 2.3 2.4 2.5* 2.3  --  2.3   Liver Function Tests: No results for input(s): AST, ALT, ALKPHOS, BILITOT, PROT, ALBUMIN in the last 168 hours. No results for input(s): LIPASE, AMYLASE in the last 168 hours. No results for input(s): AMMONIA in the last 168 hours. CBC: Recent Labs  Lab 03/29/20 0542 03/30/20 0619 03/31/20 0549 04/01/20 0509 04/02/20 0501  WBC 12.1* 13.1* 12.9* 11.9* 8.8  HGB 9.3* 9.1* 9.1* 9.0* 9.4*  HCT 31.5* 31.6* 31.2* 30.6* 31.6*  MCV 82.7 84.3 84.8 85.5 86.6  PLT 323 326 317 318 308   Cardiac Enzymes: No results for input(s): CKTOTAL, CKMB, CKMBINDEX, TROPONINI in the last 168 hours. BNP: Invalid input(s): POCBNP CBG: Recent Labs  Lab 03/31/20 1645 04/01/20 0806 04/01/20 1151 04/01/20 1528 04/02/20 0803  GLUCAP 152* 131* 153* 81 103*   D-Dimer No results for input(s): DDIMER in the last 72 hours. Hgb A1c No results for input(s): HGBA1C in the last 72 hours. Lipid Profile No results for input(s): CHOL, HDL, LDLCALC, TRIG, CHOLHDL, LDLDIRECT in the last 72 hours. Thyroid function studies No results for  input(s): TSH, T4TOTAL, T3FREE, THYROIDAB in the last 72 hours.  Invalid input(s): FREET3 Anemia work up No results for input(s): VITAMINB12, FOLATE, FERRITIN, TIBC, IRON, RETICCTPCT in the last 72 hours. Urinalysis    Component Value Date/Time   COLORURINE YELLOW (A) 03/25/2020 1133   APPEARANCEUR CLOUDY (A) 03/25/2020 1133   APPEARANCEUR Cloudy 05/31/2012 2019   LABSPEC 1.018 03/25/2020 1133   LABSPEC 1.011 05/31/2012 2019   PHURINE 9.0 (H) 03/25/2020 1133   GLUCOSEU NEGATIVE 03/25/2020 1133   GLUCOSEU 50 mg/dL 05/31/2012 2019   HGBUR NEGATIVE 03/25/2020 1133   [place] 03/25/2020 1133   BILIRUBINUR Negative 05/31/2012 2019   KETONESUR NEGATIVE 03/25/2020 1133   PROTEINUR NEGATIVE 03/25/2020 1133   NITRITE NEGATIVE 03/25/2020 1133   LEUKOCYTESUR NEGATIVE 03/25/2020 1133   LEUKOCYTESUR Negative 05/31/2012 2019   Sepsis Labs Invalid input(s): PROCALCITONIN,  WBC,  LACTICIDVEN Microbiology Recent Results (from the past 240 hour(s))  Resp Panel by RT-PCR (Flu A&B, Covid) Nasopharyngeal Swab     Status: None   Collection Time: 03/25/20 11:33 AM   Specimen: Nasopharyngeal Swab; Nasopharyngeal(NP) swabs in vial transport medium  Result Value Ref Range Status   SARS Coronavirus 2 by RT PCR NEGATIVE NEGATIVE Final    Comment: (NOTE) SARS-CoV-2 target nucleic acids are NOT DETECTED.  The SARS-CoV-2 RNA is generally detectable in upper respiratory specimens during the acute phase of infection. The lowest concentration of SARS-CoV-2 viral copies this assay can detect is 138 copies/mL. A negative result does not preclude SARS-Cov-2 infection and should not be used as the sole basis for treatment or other patient management decisions. A negative result may occur with  improper specimen collection/handling, submission of specimen other than nasopharyngeal swab, presence of viral mutation(s) within the areas targeted by this assay, and inadequate number of viral copies(<138 copies/mL). A negative result must be combined with clinical observations, patient history, and epidemiological information. The expected result is Negative.  Fact Sheet for Patients:  EntrepreneurPulse.com.au  Fact Sheet for Healthcare Providers:  IncredibleEmployment.be  This test is no t yet approved or cleared by the [place] FDA and  has been authorized for detection and/or diagnosis of SARS-CoV-2 by FDA under an Emergency Use Authorization (EUA). This EUA will remain  in effect (meaning this test can be used) for the duration of  the COVID-19 declaration under Section 564(b)(1) of the Act, 21 U.S.C.section 360bbb-3(b)(1), unless the authorization is terminated  or revoked sooner.       Influenza A by PCR NEGATIVE NEGATIVE Final   Influenza B by PCR NEGATIVE NEGATIVE Final    Comment: (NOTE) The Xpert Xpress SARS-CoV-2/FLU/RSV plus assay is intended as an aid in the diagnosis of influenza from Nasopharyngeal swab specimens and should not be used as a sole basis for treatment. Nasal washings and aspirates are unacceptable for Xpert Xpress SARS-CoV-2/FLU/RSV testing.  Fact Sheet for Patients: EntrepreneurPulse.com.au  Fact Sheet for Healthcare Providers: IncredibleEmployment.be  This test is not yet approved or cleared by the [place] FDA and has been authorized for detection and/or diagnosis of SARS-CoV-2 by FDA under an Emergency Use Authorization (EUA). This EUA will remain in effect (meaning this test can be used) for the duration of the COVID-19 declaration under Section 564(b)(1) of the Act, 21 U.S.C. section 360bbb-3(b)(1), unless the authorization is terminated or revoked.  Performed at [facility], [address] [place], [place] [address]   MRSA PCR Screening     Status: Abnormal   Collection Time: 03/28/20  6:18  PM  Result Value Ref Range Status   MRSA by PCR POSITIVE (A) NEGATIVE Final    Comment:        The GeneXpert MRSA Assay (FDA approved for NASAL specimens only), is one component of a comprehensive MRSA colonization surveillance program. It is not intended to diagnose MRSA infection nor to guide or monitor treatment for MRSA infections. RESULT CALLED TO, READ BACK BY AND VERIFIED WITH: Earlie Server 03/28/20 AT 1928 BY ACR Performed at [facility], [address] [place], [place] [address]   Acid Fast Smear (AFB)     Status: None   Collection Time: 03/29/20 11:38 AM   Specimen: PATH Cytology Pleural fluid   Result Value Ref Range Status   AFB Specimen Processing Concentration  Final   Acid Fast Smear Negative  Final    Comment: (NOTE) Performed At: [facility] [place], [place] [id number] Rush Farmer MD QI:[id number]    Source (AFB) PLEURAL  Final    Comment: Performed at [facility], [address] [place], [place] [address]  Body fluid culture w Gram Stain     Status: None   Collection Time: 03/29/20 11:38 AM   Specimen: PATH Cytology Pleural fluid  Result Value Ref Range Status   Specimen Description   Final    PLEURAL Performed at [facility], [address] [place], [place] [address]    Special Requests   Final    PLEURAL Performed at [facility], [address] [place], [place] [address]    Gram Stain   Final    MODERATE WBC PRESENT, PREDOMINANTLY MONONUCLEAR NO ORGANISMS SEEN    Culture   Final    NO GROWTH 3 DAYS Performed at [facility], [place] [address], [place], [place] [address]    Report Status 04/01/2020 FINAL  Final  Body fluid culture w Gram Stain     Status: None (Preliminary result)   Collection Time: 03/30/20  3:34 PM   Specimen: PATH Cytology Pleural fluid  Result Value Ref Range Status   Specimen Description   Final    PLEURAL Performed at [facility], [address] [place], [place] [address]    Special Requests   Final    NONE Performed at [facility], [address] [place], [place] [address]    Gram Stain   Final    WBC PRESENT, PREDOMINANTLY MONONUCLEAR NO ORGANISMS SEEN    Culture   Final    NO GROWTH 2 DAYS Performed at [facility], [place] [address], [place],  [address]    Report Status PENDING  Incomplete     Total time spend on discharging this patient, including the last patient exam, discussing the hospital stay, instructions for ongoing care as it relates to all pertinent caregivers, as well as preparing the medical discharge  records, prescriptions, and/or referrals as applicable, is 40 minutes.    Enzo Bi, MD  [facility] 04/02/2020, 9:38 AM

## 2020-04-03 LAB — BODY FLUID CULTURE W GRAM STAIN: Culture: NO GROWTH

## 2020-04-06 ENCOUNTER — Ambulatory Visit: Payer: Medicare Other | Admitting: Gastroenterology

## 2020-04-11 ENCOUNTER — Other Ambulatory Visit: Payer: Self-pay

## 2020-04-12 ENCOUNTER — Encounter: Payer: Self-pay | Admitting: Gastroenterology

## 2020-04-12 ENCOUNTER — Other Ambulatory Visit: Payer: Self-pay

## 2020-04-12 ENCOUNTER — Ambulatory Visit (INDEPENDENT_AMBULATORY_CARE_PROVIDER_SITE_OTHER): Payer: Medicare Other | Admitting: Gastroenterology

## 2020-04-12 VITALS — BP 111/47 | HR 57 | Temp 97.7°F | Ht 63.0 in | Wt 119.5 lb

## 2020-04-12 DIAGNOSIS — D509 Iron deficiency anemia, unspecified: Secondary | ICD-10-CM

## 2020-04-12 DIAGNOSIS — M1710 Unilateral primary osteoarthritis, unspecified knee: Secondary | ICD-10-CM | POA: Insufficient documentation

## 2020-04-12 DIAGNOSIS — K5909 Other constipation: Secondary | ICD-10-CM

## 2020-04-12 DIAGNOSIS — S72143A Displaced intertrochanteric fracture of unspecified femur, initial encounter for closed fracture: Secondary | ICD-10-CM | POA: Insufficient documentation

## 2020-04-12 DIAGNOSIS — S52539A Colles' fracture of unspecified radius, initial encounter for closed fracture: Secondary | ICD-10-CM | POA: Insufficient documentation

## 2020-04-12 DIAGNOSIS — M179 Osteoarthritis of knee, unspecified: Secondary | ICD-10-CM | POA: Insufficient documentation

## 2020-04-12 NOTE — Progress Notes (Signed)
Cephas Darby, MD [address]  [place]  [place], [place] [address]  Main: [phone]  Fax: [phone]    Gastroenterology Consultation  Referring Provider:     [name], MD Primary Care Physician:  [name], MD Primary Gastroenterologist:  Dr. Cephas Darby Reason for Consultation:     Chronic iron deficiency anemia        HPI:   [name] [name] is a 87 y.o. female referred by Dr. [name], MD  for consultation & management of chronic iron deficiency anemia.  Patient was admitted to [facility] on 03/25/2020 secondary to hypoxic respiratory failure, volume overload and worsening of iron deficiency anemia.  She had bilateral pleural effusions, underwent bilateral thoracentesis, fluid analysis is consistent with exudate, lymphocyte predominant, no evidence of malignancy.  There was no evidence of infection.  Patient has history of diastolic heart failure, history of A. fib on Xarelto.  Patient underwent a CT angio abdomen and pelvis with and without contrast as well as CT chest with contrast with no evidence of neoplasm or acute intra-abdominal pathology other than large bilateral pleural effusions.  Patient received blood transfusion and IV iron as inpatient.  After she was stabilized from cardiac standpoint and pulmonary standpoint, she underwent upper endoscopy which was unremarkable, no source of bleeding identified.  Patient is started on Protonix 40 mg twice daily and was restarted on Xarelto.  Interval summary Patient reports good days and bad days since she was out of the hospital, she is accompanied by her son today.  She does report fatigue.  She is taking oral iron daily and reports lower abdominal bloating as well as discomfort.  She does acknowledge drinking 1 to 2 cans of diet Coke, sweet tea with artificial sweeteners daily.  She had a CT abdomen and pelvis which revealed diverticulosis of the colon as well as large colonic stool burden.   She takes MiraLAX once a day, bowel movements are associated with straining.  She reports her stools are dark but formed.  She is taking furosemide 40 mg once a day, however reports bilateral swelling of her feet.  She is not wearing compression devices that she was advised to.  Patient had a follow-up with PCP after she was discharged from the hospital, her hemoglobin was 8.8, MCV 86.8, B12 1062, normal BUN/creatinine.  Patient is not on oxygen.  She denies any shortness of breath.  Patient is scheduled for video capsule endoscopy on 04/19/2020   NSAIDs: None  Antiplts/Anticoagulants/Anti thrombotics: Xarelto for history of A. fib  GI Procedures:  colonoscopy in 07/2014, found to have pancolonic diverticulosis, internal hemorrhoids, 3 subcentimeter polyps were removed, tubular adenomas on pathology  Upper endoscopy 03/28/2020 - Normal duodenal bulb and second portion of the duodenum. - Normal stomach. - Granular mucosa in the esophagus. Biopsied. - Normal gastroesophageal junction. DIAGNOSIS:  A. ESOPHAGUS AT 17 CM; COLD BIOPSY:  - HETEROTOPIC GASTRIC MUCOSA CONSISTENT WITH INLET PATCH.  - SQUAMOUS EPITHELIUM WITH FOCAL NEUTROPHILIC INFLAMMATION.  - NEGATIVE FOR GOBLET CELLS, DYSPLASIA, AND MALIGNANCY.   Past Medical History:  Diagnosis Date  . Anxiety   . Cancer ([place])    Squamous Cell Carcinoma (top of head)  . Constipation, chronic   . Deaf, left   . Diverticular disease   . Hearing aid worn   . Hearing deficit   . History of Bell's palsy    left side - resolved - 50 yrs ago  . Hypertension   . Internal hemorrhoids   .  Osteopenia   . Pure hypercholesterolemia     Past Surgical History:  Procedure Laterality Date  . CATARACT EXTRACTION W/PHACO Left 11/16/2015   Procedure: CATARACT EXTRACTION PHACO AND INTRAOCULAR LENS PLACEMENT (IOC);  Surgeon: Leandrew Koyanagi, MD;  Location: [facility];  Service: Ophthalmology;  Laterality: Left;  TORIC  . CATARACT EXTRACTION  W/PHACO Right 12/07/2015   Procedure: CATARACT EXTRACTION PHACO AND INTRAOCULAR LENS PLACEMENT (IOC);  Surgeon: Leandrew Koyanagi, MD;  Location: [facility] [place];  Service: Ophthalmology;  Laterality: Right;  TORIC  . COLONOSCOPY WITH PROPOFOL N/A 08/10/2014   Procedure: COLONOSCOPY WITH PROPOFOL;  Surgeon: Lollie Sails, MD;  Location: [facility] ENDOSCOPY;  Service: Endoscopy;  Laterality: N/A;  . ESOPHAGOGASTRODUODENOSCOPY (EGD) WITH PROPOFOL N/A 03/28/2020   Procedure: ESOPHAGOGASTRODUODENOSCOPY (EGD) WITH PROPOFOL;  Surgeon: Lucilla Lame, MD;  Location: [facility] ENDOSCOPY;  Service: Endoscopy;  Laterality: N/A;  . FEMUR FRACTURE SURGERY    . Nasal Polyp Removal       Current Outpatient Medications:  .  acetaminophen (TYLENOL) 500 MG tablet, Take 1 tablet (500 mg total) by mouth every 6 (six) hours as needed., Disp: 30 tablet, Rfl: 0 .  amiodarone (PACERONE) 200 MG tablet, Take 1 tablet (200 mg total) by mouth daily., Disp: 30 tablet, Rfl: 1 .  B Complex-C (B-COMPLEX WITH VITAMIN C) tablet, Take 1 tablet by mouth daily., Disp: , Rfl:  .  calcium carbonate 1250 MG capsule, Take 2 capsules by mouth 2 (two) times daily with a meal., Disp: , Rfl:  .  Cholecalciferol 25 MCG (1000 UT) tablet, Take 1 tablet by mouth in the morning and at bedtime., Disp: , Rfl:  .  Cholecalciferol 25 MCG (1000 UT) tablet, Take by mouth., Disp: , Rfl:  .  digoxin (LANOXIN) 0.125 MG tablet, Take 62.5 mcg by mouth daily., Disp: , Rfl:  .  furosemide (LASIX) 40 MG tablet, Take 1 tablet (40 mg total) by mouth daily., Disp: 30 tablet, Rfl: 1 .  iron polysaccharides (NIFEREX) 150 MG capsule, Take 1 capsule (150 mg total) by mouth daily. (Patient not taking: No sig reported), Disp: 30 capsule, Rfl: 0 .  lovastatin (MEVACOR) 40 MG tablet, Take 40 mg by mouth at bedtime., Disp: , Rfl:  .  lovastatin (MEVACOR) 40 MG tablet, Take 1 tablet by mouth at bedtime., Disp: , Rfl:  .  metoprolol tartrate (LOPRESSOR) 50 MG tablet, Take 1  tablet (50 mg total) by mouth 2 (two) times daily., Disp: 60 tablet, Rfl: 1 .  metoprolol tartrate (LOPRESSOR) 50 MG tablet, Take 1 tablet by mouth 2 (two) times daily. (Patient not taking: No sig reported), Disp: , Rfl:  .  pantoprazole (PROTONIX) 40 MG tablet, Take 1 tablet (40 mg total) by mouth 2 (two) times daily before a meal., Disp: 60 tablet, Rfl: 2 .  polyethylene glycol powder (GLYCOLAX/MIRALAX) 17 GM/SCOOP powder, Take by mouth., Disp: , Rfl:  .  Rivaroxaban (XARELTO) 15 MG TABS tablet, Take 1 tablet (15 mg total) by mouth daily with supper., Disp: 42 tablet, Rfl: 1 .  triamcinolone (KENALOG) 0.1 %, Apply topically 2 (two) times daily., Disp: , Rfl:  .  verapamil (CALAN-SR) 180 MG CR tablet, Take 180 mg by mouth daily., Disp: , Rfl:    Family History  Problem Relation Age of Onset  . Colon cancer Mother      Social History   Tobacco Use  . Smoking status: Former Smoker    Quit date: 10/30/1985    Years since quitting: 34.4  .  Smokeless tobacco: Never Used  Substance Use Topics  . Alcohol use: No  . Drug use: No    Allergies as of 04/12/2020 - Review Complete 04/12/2020  Allergen Reaction Noted  . Metoprolol Swelling 09/25/2019  . Bactrim [sulfamethoxazole-trimethoprim] Other (See Comments) 08/09/2014  . Ciprofloxacin Other (See Comments) 08/09/2014  . Pneumococcal vaccines Other (See Comments) 07/26/2019    Review of Systems:    All systems reviewed and negative except where noted in HPI.   Physical Exam:  BP (!) 111/47 (BP Location: Left Arm, Patient Position: Sitting, Cuff Size: Normal)   Pulse (!) 57   Temp 97.7 F (36.5 C) (Oral)   Ht 5\' 3"  (1.6 m)   Wt 119 lb 8 oz (54.2 kg)   BMI 21.17 kg/m  No LMP recorded. Patient is postmenopausal.  General:   Alert,  Well-developed, well-nourished, pleasant and cooperative in NAD Head:  Normocephalic and atraumatic. Eyes:  Sclera clear, no icterus.   Conjunctiva pink. Ears: Decreased auditory acuity. Nose:  No  deformity, discharge, or lesions. Mouth:  No deformity or lesions,oropharynx pink & moist. Neck:  Supple; no masses or thyromegaly. Lungs:  Respirations even and unlabored.  Clear throughout to auscultation.   No wheezes, crackles, or rhonchi. No acute distress. Heart:  Regular rate and rhythm; no murmurs, clicks, rubs, or gallops. Abdomen:  Normal bowel sounds. Soft, mild lower abdominal tenderness, moderately distended tympanic to percussion, without masses, hepatosplenomegaly or hernias noted.  No guarding or rebound tenderness.   Rectal: Not performed Msk:  Symmetrical without gross deformities. Good, equal movement & strength bilaterally. Pulses:  Normal pulses noted. Extremities:  No clubbing or edema.  No cyanosis. Neurologic:  Alert and oriented x3;  grossly normal neurologically. Skin:  Intact without significant lesions or rashes. No jaundice. Psych:  Alert and cooperative. Normal mood and affect.  Imaging Studies: Reviewed  Assessment and Plan:   [name] [name] is a 87 y.o. female with history of A. fib on Xarelto, grade 2 diastolic heart failure, chronic iron deficiency anemia is seen as a hospital follow-up of iron deficiency anemia without overt GI bleed  Iron deficiency anemia Upper endoscopy is unremarkable Colonoscopy in 2016 revealed diverticulosis and subcentimeter tubular adenomas only Follow-up on video capsule endoscopy Follow-up with hematology, patient has appointment with Dr. Janese Banks on 3/17 Continue Protonix 40 mg 1-2 times daily as long as patient is on anticoagulation  Constipation, lower abdominal bloating and discomfort, large stool burden based on the CT scan Advised to increase MiraLAX to twice daily Increase fiber intake and adequate intake of water Avoid regular intake of carbonated beverages and artificial sweeteners   Follow up in 3 months   Cephas Darby, MD

## 2020-04-14 ENCOUNTER — Inpatient Hospital Stay: Payer: Medicare Other | Attending: Oncology | Admitting: Oncology

## 2020-04-14 ENCOUNTER — Other Ambulatory Visit: Payer: Self-pay

## 2020-04-14 ENCOUNTER — Inpatient Hospital Stay: Payer: Medicare Other

## 2020-04-14 DIAGNOSIS — R5383 Other fatigue: Secondary | ICD-10-CM | POA: Diagnosis not present

## 2020-04-14 DIAGNOSIS — D500 Iron deficiency anemia secondary to blood loss (chronic): Secondary | ICD-10-CM

## 2020-04-14 DIAGNOSIS — E538 Deficiency of other specified B group vitamins: Secondary | ICD-10-CM

## 2020-04-14 DIAGNOSIS — I4821 Permanent atrial fibrillation: Secondary | ICD-10-CM | POA: Diagnosis not present

## 2020-04-14 DIAGNOSIS — I110 Hypertensive heart disease with heart failure: Secondary | ICD-10-CM | POA: Insufficient documentation

## 2020-04-14 DIAGNOSIS — Z8719 Personal history of other diseases of the digestive system: Secondary | ICD-10-CM | POA: Diagnosis not present

## 2020-04-14 DIAGNOSIS — Z87891 Personal history of nicotine dependence: Secondary | ICD-10-CM | POA: Insufficient documentation

## 2020-04-14 DIAGNOSIS — M8580 Other specified disorders of bone density and structure, unspecified site: Secondary | ICD-10-CM | POA: Diagnosis not present

## 2020-04-14 DIAGNOSIS — Z7901 Long term (current) use of anticoagulants: Secondary | ICD-10-CM | POA: Diagnosis not present

## 2020-04-14 DIAGNOSIS — E785 Hyperlipidemia, unspecified: Secondary | ICD-10-CM | POA: Insufficient documentation

## 2020-04-14 DIAGNOSIS — Z800 Family history of malignant neoplasm of digestive organs: Secondary | ICD-10-CM

## 2020-04-14 DIAGNOSIS — J90 Pleural effusion, not elsewhere classified: Secondary | ICD-10-CM | POA: Insufficient documentation

## 2020-04-14 DIAGNOSIS — D509 Iron deficiency anemia, unspecified: Secondary | ICD-10-CM

## 2020-04-15 ENCOUNTER — Other Ambulatory Visit: Admission: RE | Admit: 2020-04-15 | Payer: Medicare Other | Source: Ambulatory Visit

## 2020-04-17 ENCOUNTER — Encounter: Payer: Self-pay | Admitting: Oncology

## 2020-04-17 NOTE — Progress Notes (Signed)
Hematology/Oncology Consult note [facility] Telephone:([phone] [phone]  Patient Care Team: [name], MD as PCP - General (Family Medicine)   Name of the patient: [name] [name]  [id number]  [birth year]    Reason for referral-iron deficiency   Referring physician-Dr. Marius Ditch  Date of visit: 04/17/20   History of presenting illness- Patient is a 87 year old female with a past medical history significant for hypertension, osteopenia, hyperlipidemia, atrial fibrillation on Xarelto referred for iron deficiency anemia patient's most recent CBC showed white count of 8.8, H&H of 9.4/31.6 with an MCV of 86 and a platelet count of 308.  Iron studies on 03/25/2020 showed high a normal TIBC of 403 and iron saturation of 6%.  Ferritin levels were low at 17.  B12 levels back in December 2021 were normal at 1021.  Patient reports baseline fatigue.  She remains independent of her ADLs.She denies any blood loss in her stool or urine.  She has recently had upper endoscopy on 03/28/2020 which was unremarkable.  She has had a colonoscopy a few years ago which also did not reveal any significant findings.  She has been evaluated by Dr. Marius Ditch with plans to getting capsule endoscopy at some point.  ECOG PS- 1  Pain scale- 0   Review of systems- Review of Systems  Constitutional: Positive for malaise/fatigue. Negative for chills, fever and weight loss.  HENT: Negative for congestion, ear discharge and nosebleeds.   Eyes: Negative for blurred vision.  Respiratory: Negative for cough, hemoptysis, sputum production, shortness of breath and wheezing.   Cardiovascular: Negative for chest pain, palpitations, orthopnea and claudication.  Gastrointestinal: Negative for abdominal pain, blood in stool, constipation, diarrhea, heartburn, melena, nausea and vomiting.  Genitourinary: Negative for dysuria, flank pain, frequency, hematuria and urgency.  Musculoskeletal:  Negative for back pain, joint pain and myalgias.  Skin: Negative for rash.  Neurological: Negative for dizziness, tingling, focal weakness, seizures, weakness and headaches.  Endo/Heme/Allergies: Does not bruise/bleed easily.  Psychiatric/Behavioral: Negative for depression and suicidal ideas. The patient does not have insomnia.     Allergies  Allergen Reactions  . Metoprolol Swelling    Feet, ankles and legs   . Bactrim [Sulfamethoxazole-Trimethoprim] Other (See Comments)    Hearing loss  . Ciprofloxacin Other (See Comments)    Flu-like symptoms  . Pneumococcal Vaccines Other (See Comments)    Flu-like symptoms    Patient Active Problem List   Diagnosis Date Noted  . Closed intertrochanteric fracture ([place]) 04/12/2020  . Colles' fracture 04/12/2020  . Osteoarthritis of knee 04/12/2020  . Symptomatic anemia 03/25/2020  . HLD (hyperlipidemia) 03/25/2020  . Bilateral leg edema 02/04/2020  . Rapid atrial fibrillation ([place]) 01/11/2020  . Chronic anticoagulation 01/11/2020  . Chronic ulcer of left leg ([place]) 01/11/2020  . Iron deficiency anemia 01/11/2020  . Hypokalemia 01/01/2020  . Left leg cellulitis 01/01/2020  . Iron deficiency anemia due to chronic blood loss 01/01/2020  . Wound cellulitis 12/31/2019  . Aortic atherosclerosis ([place]) 09/25/2019  . Hard of hearing 08/31/2019  . History of pelvic fracture 08/31/2019  . Pelvic fracture ([place]) 08/25/2019  . Permanent atrial fibrillation ([place]) 08/25/2019  . Pain   . Dehydration   . Accidental fall 07/26/2019  . UTI (urinary tract infection) 07/26/2019  . Closed fracture of right inferior pubic ramus ([place]) 07/26/2019  . HTN (hypertension) 07/26/2019  . Ambulatory dysfunction 07/26/2019  . Pubic bone fracture ([place]) 07/26/2019  . History of colon polyps 06/25/2017  . Constipation, chronic 07/27/2013  .  Osteopenia 07/27/2013     Past Medical History:  Diagnosis Date  . Anxiety   . Cancer ([place])    Squamous Cell Carcinoma  (top of head)  . Constipation, chronic   . Deaf, left   . Diverticular disease   . Hearing aid worn   . Hearing deficit   . History of Bell's palsy    left side - resolved - 50 yrs ago  . Hypertension   . Internal hemorrhoids   . Osteopenia   . Pure hypercholesterolemia      Past Surgical History:  Procedure Laterality Date  . CATARACT EXTRACTION W/PHACO Left 11/16/2015   Procedure: CATARACT EXTRACTION PHACO AND INTRAOCULAR LENS PLACEMENT (IOC);  Surgeon: Leandrew Koyanagi, MD;  Location: [place];  Service: Ophthalmology;  Laterality: Left;  TORIC  . CATARACT EXTRACTION W/PHACO Right 12/07/2015   Procedure: CATARACT EXTRACTION PHACO AND INTRAOCULAR LENS PLACEMENT (IOC);  Surgeon: Leandrew Koyanagi, MD;  Location: [place];  Service: Ophthalmology;  Laterality: Right;  TORIC  . COLONOSCOPY WITH PROPOFOL N/A 08/10/2014   Procedure: COLONOSCOPY WITH PROPOFOL;  Surgeon: Lollie Sails, MD;  Location: [facility] ENDOSCOPY;  Service: Endoscopy;  Laterality: N/A;  . ESOPHAGOGASTRODUODENOSCOPY (EGD) WITH PROPOFOL N/A 03/28/2020   Procedure: ESOPHAGOGASTRODUODENOSCOPY (EGD) WITH PROPOFOL;  Surgeon: Lucilla Lame, MD;  Location: [facility] ENDOSCOPY;  Service: Endoscopy;  Laterality: N/A;  . FEMUR FRACTURE SURGERY    . Nasal Polyp Removal      Social History   Socioeconomic History  . Marital status: Widowed    Spouse name: Not on file  . Number of children: Not on file  . Years of education: Not on file  . Highest education level: Not on file  Occupational History  . Not on file  Tobacco Use  . Smoking status: Former Smoker    Quit date: 10/30/1985    Years since quitting: 34.4  . Smokeless tobacco: Never Used  Substance and Sexual Activity  . Alcohol use: No  . Drug use: No  . Sexual activity: Not on file  Other Topics Concern  . Not on file  Social History Narrative  . Not on file   Social Determinants of Health   Financial Resource Strain: Not on file   Food Insecurity: Not on file  Transportation Needs: Not on file  Physical Activity: Not on file  Stress: Not on file  Social Connections: Not on file  Intimate Partner Violence: Not on file     Family History  Problem Relation Age of Onset  . Colon cancer Mother      Current Outpatient Medications:  .  acetaminophen (TYLENOL) 500 MG tablet, Take 1 tablet (500 mg total) by mouth every 6 (six) hours as needed., Disp: 30 tablet, Rfl: 0 .  amiodarone (PACERONE) 200 MG tablet, Take 1 tablet (200 mg total) by mouth daily., Disp: 30 tablet, Rfl: 1 .  B Complex-C (B-COMPLEX WITH VITAMIN C) tablet, Take 1 tablet by mouth daily., Disp: , Rfl:  .  calcium carbonate 1250 MG capsule, Take 2 capsules by mouth 2 (two) times daily with a meal., Disp: , Rfl:  .  Cholecalciferol 25 MCG (1000 UT) tablet, Take 1 tablet by mouth in the morning and at bedtime., Disp: , Rfl:  .  Cholecalciferol 25 MCG (1000 UT) tablet, Take by mouth., Disp: , Rfl:  .  digoxin (LANOXIN) 0.125 MG tablet, Take 62.5 mcg by mouth daily., Disp: , Rfl:  .  furosemide (LASIX) 40 MG tablet, Take 1 tablet (  40 mg total) by mouth daily., Disp: 30 tablet, Rfl: 1 .  lovastatin (MEVACOR) 40 MG tablet, Take 40 mg by mouth at bedtime., Disp: , Rfl:  .  lovastatin (MEVACOR) 40 MG tablet, Take 1 tablet by mouth at bedtime., Disp: , Rfl:  .  metoprolol tartrate (LOPRESSOR) 50 MG tablet, Take 1 tablet (50 mg total) by mouth 2 (two) times daily., Disp: 60 tablet, Rfl: 1 .  pantoprazole (PROTONIX) 40 MG tablet, Take 1 tablet (40 mg total) by mouth 2 (two) times daily before a meal., Disp: 60 tablet, Rfl: 2 .  polyethylene glycol powder (GLYCOLAX/MIRALAX) 17 GM/SCOOP powder, Take by mouth., Disp: , Rfl:  .  Rivaroxaban (XARELTO) 15 MG TABS tablet, Take 1 tablet (15 mg total) by mouth daily with supper., Disp: 42 tablet, Rfl: 1 .  triamcinolone (KENALOG) 0.1 %, Apply topically 2 (two) times daily., Disp: , Rfl:  .  verapamil (CALAN-SR) 180 MG CR  tablet, Take 180 mg by mouth daily., Disp: , Rfl:  .  iron polysaccharides (NIFEREX) 150 MG capsule, Take 1 capsule (150 mg total) by mouth daily. (Patient not taking: No sig reported), Disp: 30 capsule, Rfl: 0   Physical exam: There were no vitals filed for this visit. Physical Exam Constitutional:      General: She is not in acute distress. Cardiovascular:     Rate and Rhythm: Normal rate and regular rhythm.     Heart sounds: Normal heart sounds.  Pulmonary:     Effort: Pulmonary effort is normal.     Breath sounds: Normal breath sounds.  Abdominal:     General: Bowel sounds are normal.     Palpations: Abdomen is soft.  Skin:    General: Skin is warm and dry.  Neurological:     Mental Status: She is alert and oriented to person, place, and time.        CMP Latest Ref Rng & Units 04/02/2020  Glucose 70 - 99 mg/dL 116(H)  BUN 8 - 23 mg/dL 20  Creatinine 0.44 - 1.00 mg/dL 0.71  Sodium 135 - 145 mmol/L 136  Potassium 3.5 - 5.1 mmol/L 4.9  Chloride 98 - 111 mmol/L 101  CO2 22 - 32 mmol/L 27  Calcium 8.9 - 10.3 mg/dL 9.1  Total Protein 6.5 - 8.1 g/dL -  Total Bilirubin 0.3 - 1.2 mg/dL -  Alkaline Phos 38 - 126 U/L -  AST 15 - 41 U/L -  ALT 0 - 44 U/L -   CBC Latest Ref Rng & Units 04/02/2020  WBC 4.0 - 10.5 K/uL 8.8  Hemoglobin 12.0 - 15.0 g/dL 9.4(L)  Hematocrit 36.0 - 46.0 % 31.6(L)  Platelets 150 - 400 K/uL 308    No images are attached to the encounter.  CT CHEST W CONTRAST  Result Date: 03/28/2020 CLINICAL DATA:  Lung nodule. EXAM: CT CHEST WITH CONTRAST TECHNIQUE: Multidetector CT imaging of the chest was performed during intravenous contrast administration. CONTRAST:  4mL OMNIPAQUE IOHEXOL 300 MG/ML  SOLN COMPARISON:  Chest radiograph 03/28/2020. FINDINGS: Cardiovascular: Atherosclerotic calcification of the aorta, aortic valve and coronary arteries. Heart is enlarged. No pericardial effusion. Mediastinum/Nodes: Thyroid is heterogeneous and contains low-attenuation  nodules measuring up to 7 mm. No follow-up recommended (ref: J Am Coll Radiol. 2015 Feb;12(2): 143-50).No pathologically enlarged mediastinal, hilar or axillary lymph nodes. Esophagus is grossly unremarkable. Lungs/Pleura: Image quality is degraded by respiratory motion. Centrilobular emphysema. Large bilateral pleural effusions with collapse/consolidation in the adjacent lower lobes. 5 mm peripheral left  upper lobe nodule (3/39). Airway is unremarkable. Upper Abdomen: Low-attenuation lesions in the liver measure up to 2.2 cm and are likely cysts. Liver may be slightly decreased in attenuation diffusely and the margin is slightly irregular. Visualized portions of the liver, gallbladder and right adrenal gland are otherwise unremarkable. Nodular thickening of the left adrenal gland. Subcentimeter low-attenuation lesions in the kidneys are too small to characterize but statistically, cysts are likely. Visualized portions of the spleen, pancreas, stomach and bowel are unremarkable with the exception of a small hiatal hernia. No upper abdominal adenopathy. Musculoskeletal: Degenerative changes in the spine. No worrisome lytic or sclerotic lesions. Old rib fractures. IMPRESSION: 1. Large bilateral pleural effusions with collapse/consolidation in both lower lobes, difficult to exclude pneumonia. 2. 5 mm peripheral left upper lobe nodule. No follow-up needed if patient is low-risk. Non-contrast chest CT can be considered in 12 months if patient is high-risk and aching in the consideration patient's age. This recommendation follows the consensus statement: Guidelines for Management of Incidental Pulmonary Nodules Detected on CT Images: From the Fleischner Society 2017; Radiology 2017; 284:228-243. 3. Hepatic steatosis and probable cirrhosis. 4. Aortic atherosclerosis (ICD10-I70.0). Coronary artery calcification. 5.  Emphysema (ICD10-J43.9). Electronically Signed   By: Lorin Picket M.D.   On: 03/28/2020 15:23   DG Chest  Port 1 View  Result Date: 03/31/2020 CLINICAL DATA:  Shortness of breath. EXAM: PORTABLE CHEST 1 VIEW COMPARISON:  03/30/2020. FINDINGS: Mediastinum and hilar structures normal. Mild left base infiltrate noted on today's exam. Tiny left pleural effusion again noted. Mild right base atelectasis. No pneumothorax. Heart size normal. No acute bony abnormality. IMPRESSION: 1. Mild left base infiltrate noted today's exam. Small left pleural effusion again noted. 2.  Mild right base atelectasis. Electronically Signed   By: Marcello Moores  Register   On: 03/31/2020 11:14   DG Chest Port 1 View  Result Date: 03/30/2020 CLINICAL DATA:  Status post left thoracentesis. EXAM: PORTABLE CHEST 1 VIEW COMPARISON:  03/29/2020 FINDINGS: The heart size and mediastinal contours are within normal limits. After left thoracentesis there is no further discernible pleural fluid on the left. There is significant improved aeration of the left lower lung. No pneumothorax identified. Stable bilateral pulmonary interstitial prominence. No right pleural fluid. The visualized skeletal structures are unremarkable. IMPRESSION: No further discernible left pleural fluid after thoracentesis. No pneumothorax. Improved aeration of the left lower lung. Electronically Signed   By: Aletta Edouard M.D.   On: 03/30/2020 16:18   DG Chest Port 1 View  Result Date: 03/29/2020 CLINICAL DATA:  Status post right thoracentesis. EXAM: PORTABLE CHEST 1 VIEW COMPARISON:  03/28/2020 FINDINGS: Stable heart size. After right thoracentesis there is very little residual right pleural fluid. No pneumothorax. Stable moderate left pleural effusion remains. No overt pulmonary edema. IMPRESSION: No pneumothorax after right thoracentesis. Very little residual right pleural fluid present. Stable left pleural effusion. Electronically Signed   By: Aletta Edouard M.D.   On: 03/29/2020 12:26   DG CHEST PORT 1 VIEW  Result Date: 03/28/2020 CLINICAL DATA:  Pulmonary edema EXAM:  PORTABLE CHEST 1 VIEW COMPARISON:  03/26/2020 FINDINGS: Heart size within normal limits. Mild pulmonary vascular congestion has improved since prior examination. Interval improvement of bilateral pleural effusions and pulmonary edema. 4.8 mm nodule noted in the left lateral upper lung projecting just below the anterior segment of the left second rib. IMPRESSION: 1. Improving pulmonary edema. 2. 4.8 mm left upper lung nodule should be further evaluated with nonemergent contrast enhanced chest CT when patient condition  allows. 3. These results will be called to the ordering clinician or representative by the Radiologist Assistant, and communication documented in the PACS or [employer]. Electronically Signed   By: Miachel Roux M.D.   On: 03/28/2020 08:07   DG Chest Port 1 View  Result Date: 03/26/2020 CLINICAL DATA:  87 year old female with history of respiratory failure and hypoxia. EXAM: PORTABLE CHEST 1 VIEW COMPARISON:  Chest x-ray 01/15/2020. FINDINGS: There is cephalization of the pulmonary vasculature, indistinctness of the interstitial markings, and patchy airspace disease throughout the lungs bilaterally suggestive of moderate pulmonary edema. Moderate to large bilateral pleural effusions. Cardiopericardial silhouette is partially obscured but appears enlarged. Upper mediastinal contours are within normal limits. Aortic atherosclerosis. IMPRESSION: 1. The appearance the chest suggests congestive heart failure, as above. 2. Moderate enlargement of the cardiopericardial silhouette, which appears significantly worsened compared to the prior chest x-ray from January 15, 2020. This could reflect underlying cardiomegaly, however, clinical correlation for signs and symptoms of pericardial effusion is suggested. 3. Aortic atherosclerosis. Electronically Signed   By: Vinnie Langton M.D.   On: 03/26/2020 12:57   ECHOCARDIOGRAM COMPLETE  Result Date: 03/27/2020    ECHOCARDIOGRAM REPORT   Patient Name:    JAMACIA JESTER Date of Exam: 03/27/2020 Medical Rec #:  [id number]           Height:       63.0 in Accession #:    [id number]          Weight:       119.5 lb Date of Birth:  11/05/1929           BSA:          1.553 m Patient Age:    62 years            BP:           124/66 mmHg Patient Gender: F                   HR:           80 bpm. Exam Location:  [facility] Procedure: 2D Echo Indications:     Acute Respiratory Distress R06.03  History:         Patient has no prior history of Echocardiogram examinations.  Sonographer:     Arville Go RDCS Referring Phys:  [id number] [place] Diagnosing Phys: [name] MD IMPRESSIONS  1. Left ventricular ejection fraction, by estimation, is 60 to 65%. The left ventricle has normal function. The left ventricle demonstrates regional wall motion abnormalities (see scoring diagram/findings for description). There is mild left ventricular  hypertrophy. Left ventricular diastolic parameters are consistent with Grade II diastolic dysfunction (pseudonormalization).  2. Right ventricular systolic function is normal. The right ventricular size is normal.  3. Moderate pleural effusion in the left lateral region.  4. The mitral valve is myxomatous. Moderate to severe mitral valve regurgitation. Moderate mitral annular calcification.  5. The aortic valve is normal in structure. Aortic valve regurgitation is trivial. FINDINGS  Left Ventricle: Left ventricular ejection fraction, by estimation, is 60 to 65%. The left ventricle has normal function. The left ventricle demonstrates regional wall motion abnormalities. The left ventricular internal cavity size was normal in size. There is mild left ventricular hypertrophy. Left ventricular diastolic parameters are consistent with Grade II diastolic dysfunction (pseudonormalization). Right Ventricle: The right ventricular size is normal. No increase in right ventricular wall thickness. Right ventricular systolic function is normal. Left Atrium:  Left atrial size  was normal in size. Right Atrium: Right atrial size was normal in size. Pericardium: There is no evidence of pericardial effusion. Mitral Valve: The mitral valve is myxomatous. There is severe thickening of the anterior mitral valve leaflet(s). There is severe calcification of the anterior mitral valve leaflet(s). Normal mobility of the mitral valve leaflets. Moderate mitral annular  calcification. Moderate to severe mitral valve regurgitation. Tricuspid Valve: The tricuspid valve is normal in structure. Tricuspid valve regurgitation is mild. Aortic Valve: The aortic valve is normal in structure. Aortic valve regurgitation is trivial. Aortic regurgitation PHT measures 423 msec. Aortic valve peak gradient measures 8.9 mmHg. Pulmonic Valve: The pulmonic valve was grossly normal. Pulmonic valve regurgitation is not visualized. Aorta: The ascending aorta was not well visualized. IAS/Shunts: No atrial level shunt detected by color flow Doppler. Additional Comments: There is a moderate pleural effusion in the left lateral region.  LEFT VENTRICLE PLAX 2D LVIDd:         4.75 cm  Diastology LVIDs:         3.18 cm  LV e' medial:   5.44 cm/s LV PW:         0.94 cm  LV E/e' medial: 17.8 LV IVS:        0.94 cm LVOT diam:     1.80 cm LV SV:         48 LV SV Index:   31 LVOT Area:     2.54 cm  RIGHT VENTRICLE RV Basal diam:  2.19 cm RV S prime:     13.50 cm/s TAPSE (M-mode): 2.0 cm LEFT ATRIUM             Index       RIGHT ATRIUM           Index LA diam:        3.10 cm 2.00 cm/m  RA Area:     13.50 cm LA Vol (A2C):   72.1 ml 46.41 ml/m RA Volume:   32.70 ml  21.05 ml/m LA Vol (A4C):   50.8 ml 32.70 ml/m LA Biplane Vol: 61.0 ml 39.27 ml/m  AORTIC VALVE                PULMONIC VALVE AV Area (Vmax): 1.47 cm    PV Vmax:       1.09 m/s AV Vmax:        149.00 cm/s PV Peak grad:  4.8 mmHg AV Peak Grad:   8.9 mmHg LVOT Vmax:      86.20 cm/s LVOT Vmean:     50.600 cm/s LVOT VTI:       0.188 m AI PHT:         423  msec  AORTA Ao Root diam: 2.80 cm Ao Asc diam:  2.80 cm MITRAL VALVE                TRICUSPID VALVE MV Area (PHT): 4.71 cm     TV Peak grad:   35.8 mmHg MV Decel Time: 161 msec     TV Vmax:        2.99 m/s MV E velocity: 96.80 cm/s MV A velocity: 106.00 cm/s  SHUNTS MV E/A ratio:  0.91         Systemic VTI:  0.19 m                             Systemic Diam: 1.80 cm [name] MD Electronically signed  by [name] MD Signature Date/Time: 03/27/2020/3:06:22 PM    Final    CT Angio Abd/Pel W and/or Wo Contrast  Result Date: 03/25/2020 CLINICAL DATA:  Fatigue.  Anemia.  Evaluate for GI bleeding. EXAM: CTA ABDOMEN AND PELVIS WITHOUT AND WITH CONTRAST TECHNIQUE: Multidetector CT imaging of the abdomen and pelvis was performed using the standard protocol during bolus administration of intravenous contrast. Multiplanar reconstructed images and MIPs were obtained and reviewed to evaluate the vascular anatomy. CONTRAST:  87mL OMNIPAQUE IOHEXOL 350 MG/ML SOLN COMPARISON:  CT abdomen pelvis-08/14/2015 FINDINGS: VASCULAR Aorta: Calcified atherosclerotic plaque within normal caliber abdominal aorta. No evidence of abdominal aortic dissection or periaortic stranding this nongated examination. Celiac: There is a minimal amount of calcified atherosclerotic plaque involving the origin the celiac artery, not resulting in a hemodynamically significant narrowing. SMA: Widely patent without hemodynamically significant narrowing. Conventional branching pattern. The distal tributaries the SMA appear widely patent without discrete lumen filling defect to suggest distal embolism. Renals: There is a tiny accessory left renal artery supplies the inferior pole of the left kidney. Solitary right-sided renal artery. There is a minimal amount of calcified atherosclerotic plaque involving the origin of the bilateral dominant renal arteries, not resulting in a hemodynamically significant stenosis. No vessel irregularity to suggest  FMD. IMA: Remains patent. Inflow: Eccentric calcified atherosclerotic plaque involving the proximal aspect of the right common iliac artery approaches 50% luminal narrowing (axial image 131, series 5; coronal image 35, series 9). The left common iliac artery is mildly disease though patent and of normal caliber. The bilateral internal iliac arteries are diseased though patent and of normal caliber. The bilateral external iliac arteries are tortuous though of normal caliber and widely patent without a hemodynamically significant narrowing. Proximal Outflow: There is a minimal amount of eccentric calcified atherosclerotic plaque involving the bilateral common femoral arteries, not resulting in hemodynamically significant stenosis. The imaged portions of the bilateral deep and superficial femoral arteries are widely patent without a hemodynamically significant narrowing. Veins: The IVC and pelvic venous systems appear widely patent. Review of the MIP images confirms the above findings. _________________________________________________________ NON-VASCULAR Lower chest: Limited visualization of lower thorax demonstrates small to moderate-sized bilateral pleural effusions with associated subsegmental consolidative opacities and air bronchograms and associated partial atelectasis of the bilateral lower lobes. Cardiomegaly. There is diffuse decreased attenuation of the intra cardiac blood pool suggestive of anemia. Coronary artery calcifications. No pericardial effusion. Hepatobiliary: Normal hepatic contour. Redemonstrated multiple hypoattenuating nonenhancing hepatic cysts including dominant approximately 2.1 cm cyst within the dome of the left lobe of the liver. Note is made of a punctate granuloma within the anterior segment of the right lobe of the liver (image 24, series 2), unchanged. No discrete worrisome hyperenhancing hepatic lesions. Normal appearance of the gallbladder given degree distention. No discrete  radiopaque gallstones. No intra or extrahepatic biliary ductal dilatation. No ascites. Pancreas: Normal appearance of the pancreas. Spleen: Normal appearance of the spleen. Adrenals/Urinary Tract: There is symmetric enhancement of the bilateral kidneys. No evidence of nephrolithiasis. There is a minimal amount of grossly symmetric bilateral likely age and body habitus related perinephric stranding. No urine obstruction. No discrete renal lesions. There is mild thickening of the medial limb and crux of the left adrenal gland without discrete nodule, similar to the 2017 examination. Normal appearance of the right adrenal gland. Normal appearance of the urinary bladder. Stomach/Bowel: No discrete areas of intraluminal contrast extravasation to suggest the etiology of clinical suspicion of GI bleeding. Moderate to large colonic  stool burden without evidence of enteric obstruction. Scattered radiopaque pill fragments are seen throughout the small intestine. Colonic diverticulosis without evidence of superimposed acute diverticulitis. Small hiatal hernia. There is mild diffuse thickening the stomach wall, potentially accentuated due to underdistention. Otherwise, there are no discrete areas of bowel wall thickening. Normal appearance of the terminal ileum. The appendix is not visualized, however there is no pericecal inflammatory change. No pneumoperitoneum, pneumatosis or portal venous gas. Lymphatic: No bulky retroperitoneal, mesenteric, pelvic or inguinal lymphadenopathy. Reproductive: Suspected degenerating fibroid within the left side of the uterine fundus. Otherwise, normal appearance of the pelvic organs for age. No free fluid the pelvic cul-de-sac. Other: Minimal amount of subcutaneous edema about the midline of the low back. Musculoskeletal: No acute or aggressive osseous abnormalities. Post intramedullary fixation of the right femur with dynamic screw fixation of the right femoral neck. Mild-to-moderate  multilevel lumbar spine DDD worse at L4-L5 and L5-S1. IMPRESSION: Vascular Impression: 1. No discrete areas of intraluminal contrast extravasation to suggest the source of clinical suspicion of GI bleeding. 2. Scattered atherosclerotic plaque within normal caliber abdominal aorta. Aortic Atherosclerosis (ICD10-I70.0). 3. Cardiomegaly.  Coronary artery calcifications. 4. Suspected hemodynamically significant narrowing involving the right common iliac artery. Correlation for symptoms of right lower extremity PAD is advised. Nonvascular Impression: 1. Diffuse thickening of the stomach wall, nonspecific and while potentially accentuated due to underdistention, could be seen in the setting of a gastritis. Further evaluation with upper endoscopy could be performed as indicated. 2. Small to moderate-sized bilateral effusions with associated partial atelectasis of the bilateral lower lobes. 3. Large colonic stool burden without evidence of enteric obstruction. 4. Colonic diverticulosis without evidence of superimposed acute diverticulitis. 5. Small hiatal hernia. Electronically Signed   By: Sandi Mariscal M.D.   On: 03/25/2020 15:32   US THORACENTESIS ASP PLEURAL SPACE W/IMG GUIDE  Result Date: 03/30/2020 INDICATION: Chronic heart failure. Bilateral pleural effusions. Right thoracentesis performed on 03/29/2020. EXAM: ULTRASOUND GUIDED LEFT THORACENTESIS MEDICATIONS: None. COMPLICATIONS: None immediate. PROCEDURE: An ultrasound guided thoracentesis was thoroughly discussed with the patient and questions answered. The benefits, risks, alternatives and complications were also discussed. The patient understands and wishes to proceed with the procedure. Written consent was obtained. Ultrasound was performed to localize and mark an adequate pocket of fluid in the left chest. The area was then prepped and draped in the normal sterile fashion. 1% Lidocaine was used for local anesthesia. Under ultrasound guidance a 19 gauge, 7-cm,  Yueh catheter was introduced. Thoracentesis was performed. The catheter was removed and a dressing applied. FINDINGS: A total of approximately 550 mL of straw-colored fluid was removed. IMPRESSION: Successful ultrasound guided left thoracentesis yielding 550 mL of pleural fluid. Electronically Signed   By: Miachel Roux M.D.   On: 03/30/2020 17:17   US THORACENTESIS ASP PLEURAL SPACE W/IMG GUIDE  Result Date: 03/29/2020 INDICATION: Patient with history of chronic heart failure and atrial fibrillation found to have bilateral pleural effusions. Request is for therapeutic and diagnostic thoracentesis EXAM: ULTRASOUND GUIDED RIGHT THERAPEUTIC AND DIAGNOSTIC THORACENTESIS MEDICATIONS: Lidocaine 1% 10 mL COMPLICATIONS: None immediate. PROCEDURE: An ultrasound guided thoracentesis was thoroughly discussed with the patient and questions answered. The benefits, risks, alternatives and complications were also discussed. The patient understands and wishes to proceed with the procedure. Written consent was obtained. Ultrasound was performed to localize and mark an adequate pocket of fluid in the right chest. The area was then prepped and draped in the normal sterile fashion. 1% Lidocaine was used for local anesthesia.  Under ultrasound guidance a 6 Fr Safe-T-Centesis catheter was introduced. Thoracentesis was performed. The catheter was removed and a dressing applied. FINDINGS: A total of approximately 600 mL of straw-colored fluid was removed. Samples were sent to the laboratory as requested by the clinical team. IMPRESSION: Successful ultrasound guided therapeutic and diagnostic right-sided thoracentesis yielding 600 mL of pleural fluid. Read by: Rushie Nyhan, NP Electronically Signed   By: Aletta Edouard M.D.   On: 03/29/2020 12:28    Assessment and plan- Patient is a 87 y.o. female referred for iron deficiency anemia  Most recent labs from February 2022 would indicate of of iron deficiency.  B12 levels were  normal.  She is currently on Xarelto for A. fib.  She has been evaluated by GI for possible GI bleeding.  Upper endoscopy was unremarkable and given her age and risk of colonoscopy currently that is not being repeated.  She had a last 1 in 2016 which did show internal hemorrhoids.  She will be getting a capsule study soon.  For now we will proceed with IV iron given that she has evidence of iron deficiency.  Based on her insurance I would recommend 2 doses of Feraheme 510 mg IV given weekly.  I will repeat CBC ferritin iron studies B12 and folate in 8 weeks time following that and see her thereafter.  Patient and her son comprehend my plan well   Thank you for this kind referral and the opportunity to participate in the care of this patient   Visit Diagnosis 1. Iron deficiency anemia, unspecified iron deficiency anemia type   2. B12 deficiency     Dr. Randa Evens, MD, MPH [facility] [phone] 04/17/2020 5:11 PM

## 2020-04-18 ENCOUNTER — Telehealth: Payer: Self-pay

## 2020-04-18 NOTE — Telephone Encounter (Signed)
Patient son called front desk stating he had some questions about the capsule study for tomorrow. Tried to call patient son but voicemail was full

## 2020-04-19 ENCOUNTER — Encounter
Admission: RE | Disposition: A | Discharge status: Home / Self Care | Payer: Self-pay | Source: Home / Self Care | Attending: Gastroenterology

## 2020-04-19 ENCOUNTER — Ambulatory Visit
Admission: RE | Admit: 2020-04-19 | Discharge: 2020-04-19 | Disposition: A | Discharge status: Home / Self Care | Payer: Medicare Other | Attending: Gastroenterology | Admitting: Gastroenterology

## 2020-04-19 ENCOUNTER — Other Ambulatory Visit: Payer: Self-pay

## 2020-04-19 ENCOUNTER — Inpatient Hospital Stay: Payer: Medicare Other

## 2020-04-19 DIAGNOSIS — D509 Iron deficiency anemia, unspecified: Secondary | ICD-10-CM | POA: Diagnosis present

## 2020-04-19 DIAGNOSIS — D500 Iron deficiency anemia secondary to blood loss (chronic): Secondary | ICD-10-CM

## 2020-04-19 DIAGNOSIS — K639 Disease of intestine, unspecified: Secondary | ICD-10-CM | POA: Insufficient documentation

## 2020-04-19 HISTORY — PX: GIVENS CAPSULE STUDY: SHX5432

## 2020-04-19 SURGERY — IMAGING PROCEDURE, GI TRACT, INTRALUMINAL, VIA CAPSULE

## 2020-04-20 ENCOUNTER — Encounter: Payer: Self-pay | Admitting: Gastroenterology

## 2020-04-26 ENCOUNTER — Inpatient Hospital Stay: Payer: Medicare Other

## 2020-04-26 VITALS — BP 128/55 | HR 83 | Temp 98.0°F | Resp 18

## 2020-04-26 DIAGNOSIS — D500 Iron deficiency anemia secondary to blood loss (chronic): Secondary | ICD-10-CM

## 2020-04-26 MED ORDER — SODIUM CHLORIDE 0.9 % IV SOLN
510.0000 mg | Freq: Once | INTRAVENOUS | Status: AC
  Administered 2020-04-26: 510 mg via INTRAVENOUS
  Filled 2020-04-26: qty 510

## 2020-04-26 MED ORDER — SODIUM CHLORIDE 0.9 % IV SOLN
Freq: Once | INTRAVENOUS | Status: AC
  Filled 2020-04-26: qty 250

## 2020-05-03 ENCOUNTER — Inpatient Hospital Stay: Payer: Medicare Other | Attending: Oncology

## 2020-05-03 ENCOUNTER — Other Ambulatory Visit: Payer: Self-pay | Admitting: Oncology

## 2020-05-03 VITALS — BP 145/72 | HR 73 | Temp 98.1°F | Resp 18

## 2020-05-03 DIAGNOSIS — Z800 Family history of malignant neoplasm of digestive organs: Secondary | ICD-10-CM | POA: Insufficient documentation

## 2020-05-03 DIAGNOSIS — E785 Hyperlipidemia, unspecified: Secondary | ICD-10-CM | POA: Insufficient documentation

## 2020-05-03 DIAGNOSIS — I4821 Permanent atrial fibrillation: Secondary | ICD-10-CM | POA: Diagnosis not present

## 2020-05-03 DIAGNOSIS — D500 Iron deficiency anemia secondary to blood loss (chronic): Secondary | ICD-10-CM

## 2020-05-03 DIAGNOSIS — Z7901 Long term (current) use of anticoagulants: Secondary | ICD-10-CM | POA: Insufficient documentation

## 2020-05-03 DIAGNOSIS — M8580 Other specified disorders of bone density and structure, unspecified site: Secondary | ICD-10-CM | POA: Insufficient documentation

## 2020-05-03 DIAGNOSIS — Z8719 Personal history of other diseases of the digestive system: Secondary | ICD-10-CM | POA: Insufficient documentation

## 2020-05-03 DIAGNOSIS — J90 Pleural effusion, not elsewhere classified: Secondary | ICD-10-CM | POA: Diagnosis not present

## 2020-05-03 DIAGNOSIS — C180 Malignant neoplasm of cecum: Secondary | ICD-10-CM | POA: Diagnosis present

## 2020-05-03 DIAGNOSIS — Z87891 Personal history of nicotine dependence: Secondary | ICD-10-CM | POA: Insufficient documentation

## 2020-05-03 DIAGNOSIS — I110 Hypertensive heart disease with heart failure: Secondary | ICD-10-CM | POA: Insufficient documentation

## 2020-05-03 DIAGNOSIS — R5383 Other fatigue: Secondary | ICD-10-CM | POA: Diagnosis not present

## 2020-05-03 MED ORDER — IRON SUCROSE 20 MG/ML IV SOLN
200.0000 mg | Freq: Once | INTRAVENOUS | Status: AC
  Administered 2020-05-03: 200 mg via INTRAVENOUS
  Filled 2020-05-03: qty 10

## 2020-05-03 MED ORDER — SODIUM CHLORIDE 0.9 % IV SOLN
Freq: Once | INTRAVENOUS | Status: AC
Start: 2020-05-03 — End: 2020-05-03
  Filled 2020-05-03: qty 250

## 2020-05-03 MED ORDER — SODIUM CHLORIDE 0.9 % IV SOLN: 200.0000 mg | INTRAVENOUS | Status: DC

## 2020-05-03 NOTE — Progress Notes (Signed)
Patient and son states that patient felt horrible after her last iron infusion. States she was extremely nauseated for several days and vomited x 1 day. Reports she felt extremely fatigued as well. Dr. Janese Banks made aware. Per Dr. Janese Banks, patient is to receive Venofer instead of Feraheme.   Patient tolerated Venofer well. Encouraged patient to reach out to clinic with any questions or concerns. Patient verbalizes understanding and denies any further questions or concerns.

## 2020-05-05 ENCOUNTER — Telehealth: Payer: Self-pay | Admitting: Gastroenterology

## 2020-05-05 ENCOUNTER — Other Ambulatory Visit
Admission: RE | Admit: 2020-05-05 | Discharge: 2020-05-05 | Disposition: A | Discharge status: Home / Self Care | Payer: Medicare Other | Source: Ambulatory Visit | Attending: Gastroenterology | Admitting: Gastroenterology

## 2020-05-05 ENCOUNTER — Other Ambulatory Visit: Payer: Self-pay

## 2020-05-05 DIAGNOSIS — D500 Iron deficiency anemia secondary to blood loss (chronic): Secondary | ICD-10-CM

## 2020-05-05 DIAGNOSIS — E538 Deficiency of other specified B group vitamins: Secondary | ICD-10-CM | POA: Diagnosis present

## 2020-05-05 DIAGNOSIS — D509 Iron deficiency anemia, unspecified: Secondary | ICD-10-CM | POA: Insufficient documentation

## 2020-05-05 LAB — CBC WITH DIFFERENTIAL/PLATELET
Abs Immature Granulocytes: 0.04 10*3/uL (ref 0.00–0.07)
Basophils Absolute: 0.1 10*3/uL (ref 0.0–0.1)
Basophils Relative: 1 %
Eosinophils Absolute: 0.3 10*3/uL (ref 0.0–0.5)
Eosinophils Relative: 5 %
HCT: 32.7 % — ABNORMAL LOW (ref 36.0–46.0)
Hemoglobin: 9.5 g/dL — ABNORMAL LOW (ref 12.0–15.0)
Immature Granulocytes: 1 %
Lymphocytes Relative: 16 %
Lymphs Abs: 1.2 10*3/uL (ref 0.7–4.0)
MCH: 26.8 pg (ref 26.0–34.0)
MCHC: 29.1 g/dL — ABNORMAL LOW (ref 30.0–36.0)
MCV: 92.1 fL (ref 80.0–100.0)
Monocytes Absolute: 0.8 10*3/uL (ref 0.1–1.0)
Monocytes Relative: 11 %
Neutro Abs: 4.9 10*3/uL (ref 1.7–7.7)
Neutrophils Relative %: 66 %
Platelets: 300 10*3/uL (ref 150–400)
RBC: 3.55 MIL/uL — ABNORMAL LOW (ref 3.87–5.11)
RDW: 25.9 % — ABNORMAL HIGH (ref 11.5–15.5)
Smear Review: NORMAL
WBC: 7.3 10*3/uL (ref 4.0–10.5)
nRBC: 0 % (ref 0.0–0.2)

## 2020-05-05 LAB — RETICULOCYTES
Immature Retic Fract: 30.8 % — ABNORMAL HIGH (ref 2.3–15.9)
RBC.: 3.67 MIL/uL — ABNORMAL LOW (ref 3.87–5.11)
Retic Count, Absolute: 162.9 10*3/uL (ref 19.0–186.0)
Retic Ct Pct: 4.4 % — ABNORMAL HIGH (ref 0.4–3.1)

## 2020-05-05 LAB — IRON AND TIBC
Iron: 65 ug/dL (ref 28–170)
Saturation Ratios: 20 % (ref 10.4–31.8)
TIBC: 325 ug/dL (ref 250–450)
UIBC: 260 ug/dL

## 2020-05-05 LAB — FERRITIN: Ferritin: 533 ng/mL — ABNORMAL HIGH (ref 11–307)

## 2020-05-05 LAB — VITAMIN B12: Vitamin B-12: 2082 pg/mL — ABNORMAL HIGH (ref 180–914)

## 2020-05-05 NOTE — Telephone Encounter (Signed)
VCE results  Study is complete, capsule reached cecum.  Normal small bowel transit.  There was no bleeding source identified in the entire small bowel.  There was presence of ulcerated lesion versus mass in the cecum next to the IC valve which is actively oozing.  Fresh blood is seen in the proximal and transverse colon  I called patient to convey the results and to schedule urgent colonoscopy. Patient answered the phone and gave it to her son who is with her.  Patient reports ongoing fatigue. I explained to her son that she will need an urgent colonoscopy.  He preferred outpatient colonoscopy.  Ordered CBC for today, if hemoglobin is less than 8, my recommendation is for her to get admitted for inpatient colonoscopy otherwise, we will schedule colonoscopy for early next week  Patient son expressed understanding of the plan   Cephas Darby, MD [address]  [place], [place] [address]  Main: [phone]  Fax: [phone] Pager: [phone]

## 2020-05-06 ENCOUNTER — Ambulatory Visit: Payer: Medicare Other

## 2020-05-06 ENCOUNTER — Other Ambulatory Visit: Payer: Self-pay

## 2020-05-06 ENCOUNTER — Telehealth: Payer: Self-pay

## 2020-05-06 DIAGNOSIS — K259 Gastric ulcer, unspecified as acute or chronic, without hemorrhage or perforation: Secondary | ICD-10-CM

## 2020-05-06 DIAGNOSIS — K6389 Other specified diseases of intestine: Secondary | ICD-10-CM

## 2020-05-06 MED ORDER — NA SULFATE-K SULFATE-MG SULF 17.5-3.13-1.6 GM/177ML PO SOLN: 354.0000 mL | Freq: Once | ORAL | 0 refills | Status: AC

## 2020-05-06 NOTE — Telephone Encounter (Signed)
Per Dr. Marius Ditch: hb is better schedule colonoscopy with in a week, Dx: colon mass/ulcer Called patient son and left a message for call back

## 2020-05-09 ENCOUNTER — Inpatient Hospital Stay: Payer: Medicare Other

## 2020-05-09 ENCOUNTER — Other Ambulatory Visit: Payer: Self-pay

## 2020-05-09 VITALS — BP 117/56 | HR 76 | Temp 97.9°F | Resp 20

## 2020-05-09 DIAGNOSIS — D500 Iron deficiency anemia secondary to blood loss (chronic): Secondary | ICD-10-CM

## 2020-05-09 MED ORDER — IRON SUCROSE 20 MG/ML IV SOLN
200.0000 mg | Freq: Once | INTRAVENOUS | Status: AC
  Administered 2020-05-09: 200 mg via INTRAVENOUS
  Filled 2020-05-09: qty 10

## 2020-05-09 MED ORDER — SODIUM CHLORIDE 0.9 % IV SOLN
Freq: Once | INTRAVENOUS | Status: AC
Start: 2020-05-09 — End: 2020-05-09
  Filled 2020-05-09: qty 250

## 2020-05-09 MED ORDER — SODIUM CHLORIDE 0.9 % IV SOLN: 200.0000 mg | INTRAVENOUS | Status: DC

## 2020-05-09 NOTE — Telephone Encounter (Signed)
Informed patient son that we could not do the colonoscopy tomorrow due to being on blood thinner. Informed him I have faxed the blood thinner request to cardiologist and would call them back to rescheduled the procedure as soon as I got it back

## 2020-05-09 NOTE — Progress Notes (Signed)
Pt tolerated venofer infusion well with no complications or problems.  Pt left infusion suite stable and ambulatory with her walker.

## 2020-05-09 NOTE — Telephone Encounter (Signed)
Patient is scheduled for colonoscopy on 05/09/2020. Went over instructions with patient son and he verablized understanding. Was looking over her medications list and patient is on Xarelto. Sent blood thinner request to cardiologist.

## 2020-05-09 NOTE — Telephone Encounter (Signed)
Called cardiologist and informed them I needed this back ASAP so patient can get a colonoscopy

## 2020-05-10 NOTE — Telephone Encounter (Signed)
Called and left a message for call back for son.

## 2020-05-10 NOTE — Telephone Encounter (Signed)
Patient son verbalized understanding of blood thinner instructions. Moved procedure to 05/12/2020.

## 2020-05-10 NOTE — Telephone Encounter (Signed)
Received blood thinner request back and they want patient to stop the Xarelto 3 days prior to procedure and restart it 2 days after procedure

## 2020-05-11 LAB — ACID FAST CULTURE WITH REFLEXED SENSITIVITIES (MYCOBACTERIA): Acid Fast Culture: NEGATIVE

## 2020-05-12 ENCOUNTER — Ambulatory Visit
Admission: RE | Admit: 2020-05-12 | Discharge: 2020-05-12 | Disposition: A | Discharge status: Home / Self Care | Payer: Medicare Other | Attending: Gastroenterology | Admitting: Gastroenterology

## 2020-05-12 ENCOUNTER — Ambulatory Visit: Payer: Medicare Other | Admitting: Certified Registered"

## 2020-05-12 ENCOUNTER — Encounter
Admission: RE | Disposition: A | Discharge status: Home / Self Care | Payer: Self-pay | Source: Home / Self Care | Attending: Gastroenterology

## 2020-05-12 ENCOUNTER — Encounter: Payer: Self-pay | Admitting: Gastroenterology

## 2020-05-12 ENCOUNTER — Other Ambulatory Visit: Payer: Self-pay

## 2020-05-12 DIAGNOSIS — Z85828 Personal history of other malignant neoplasm of skin: Secondary | ICD-10-CM | POA: Insufficient documentation

## 2020-05-12 DIAGNOSIS — Z881 Allergy status to other antibiotic agents status: Secondary | ICD-10-CM | POA: Diagnosis not present

## 2020-05-12 DIAGNOSIS — Z7901 Long term (current) use of anticoagulants: Secondary | ICD-10-CM | POA: Insufficient documentation

## 2020-05-12 DIAGNOSIS — Z800 Family history of malignant neoplasm of digestive organs: Secondary | ICD-10-CM | POA: Diagnosis not present

## 2020-05-12 DIAGNOSIS — Z887 Allergy status to serum and vaccine status: Secondary | ICD-10-CM | POA: Diagnosis not present

## 2020-05-12 DIAGNOSIS — K621 Rectal polyp: Secondary | ICD-10-CM | POA: Insufficient documentation

## 2020-05-12 DIAGNOSIS — Z79899 Other long term (current) drug therapy: Secondary | ICD-10-CM | POA: Insufficient documentation

## 2020-05-12 DIAGNOSIS — K6389 Other specified diseases of intestine: Secondary | ICD-10-CM | POA: Diagnosis not present

## 2020-05-12 DIAGNOSIS — Z87891 Personal history of nicotine dependence: Secondary | ICD-10-CM | POA: Diagnosis not present

## 2020-05-12 DIAGNOSIS — Z888 Allergy status to other drugs, medicaments and biological substances status: Secondary | ICD-10-CM | POA: Diagnosis not present

## 2020-05-12 DIAGNOSIS — K644 Residual hemorrhoidal skin tags: Secondary | ICD-10-CM | POA: Insufficient documentation

## 2020-05-12 DIAGNOSIS — D500 Iron deficiency anemia secondary to blood loss (chronic): Secondary | ICD-10-CM | POA: Insufficient documentation

## 2020-05-12 DIAGNOSIS — K5730 Diverticulosis of large intestine without perforation or abscess without bleeding: Secondary | ICD-10-CM | POA: Diagnosis not present

## 2020-05-12 DIAGNOSIS — K635 Polyp of colon: Secondary | ICD-10-CM

## 2020-05-12 DIAGNOSIS — C180 Malignant neoplasm of cecum: Secondary | ICD-10-CM | POA: Diagnosis not present

## 2020-05-12 DIAGNOSIS — K259 Gastric ulcer, unspecified as acute or chronic, without hemorrhage or perforation: Secondary | ICD-10-CM

## 2020-05-12 HISTORY — PX: COLONOSCOPY WITH PROPOFOL: SHX5780

## 2020-05-12 SURGERY — COLONOSCOPY WITH PROPOFOL
Anesthesia: General

## 2020-05-12 MED ORDER — SODIUM CHLORIDE 0.9 % IV SOLN
INTRAVENOUS | Status: DC
  Administered 2020-05-12: 1000 mL via INTRAVENOUS

## 2020-05-12 MED ORDER — PROPOFOL 500 MG/50ML IV EMUL
INTRAVENOUS | Status: AC
  Filled 2020-05-12: qty 50

## 2020-05-12 MED ORDER — HYDRALAZINE HCL 20 MG/ML IJ SOLN
INTRAMUSCULAR | Status: DC | PRN
  Administered 2020-05-12: 5 mg via INTRAVENOUS

## 2020-05-12 MED ORDER — LIDOCAINE HCL (CARDIAC) PF 100 MG/5ML IV SOSY
PREFILLED_SYRINGE | INTRAVENOUS | Status: DC | PRN
  Administered 2020-05-12: 50 mg via INTRAVENOUS

## 2020-05-12 MED ORDER — HYDRALAZINE HCL 20 MG/ML IJ SOLN
INTRAMUSCULAR | Status: AC
  Filled 2020-05-12: qty 1

## 2020-05-12 MED ORDER — PROPOFOL 10 MG/ML IV BOLUS
INTRAVENOUS | Status: DC | PRN
  Administered 2020-05-12: 10 mg via INTRAVENOUS
  Administered 2020-05-12: 30 mg via INTRAVENOUS

## 2020-05-12 MED ORDER — PROPOFOL 500 MG/50ML IV EMUL
INTRAVENOUS | Status: DC | PRN
  Administered 2020-05-12: 50 ug/kg/min via INTRAVENOUS

## 2020-05-12 NOTE — Anesthesia Preprocedure Evaluation (Addendum)
Anesthesia Evaluation  Patient identified by MRN, date of birth, ID band Patient awake    Reviewed: Allergy & Precautions, H&P , NPO status , Patient's Chart, lab work & pertinent test results, reviewed documented beta blocker date and time   Airway Mallampati: III  TM Distance: >3 FB Neck ROM: full    Dental no notable dental hx. (+) Teeth Intact   Pulmonary Recent URI , Residual Cough, former smoker,    Pulmonary exam normal breath sounds clear to auscultation       Cardiovascular Exercise Tolerance: Good hypertension, (-) Past MI Normal cardiovascular exam+ dysrhythmias Atrial Fibrillation  Rhythm:regular Rate:Normal     Neuro/Psych PSYCHIATRIC DISORDERS Anxiety  Neuromuscular disease    GI/Hepatic Neg liver ROS,   Endo/Other  negative endocrine ROS  Renal/GU negative Renal ROS  negative genitourinary   Musculoskeletal  (+) Arthritis , Osteoarthritis,    Abdominal   Peds negative pediatric ROS (+)  Hematology negative hematology ROS (+) anemia ,   Anesthesia Other Findings Past Medical History:   Hypertension                                                 Pure hypercholesterolemia                                    Constipation, chronic                                        Osteopenia                                                   Anxiety                                                      Diverticular disease          Resolving URI with non productive cough at night, afebrile at this time.                                 Hearing deficit                                              Cancer                                                         Comment:Squamous Cell Carcinoma (top of head)   Internal hemorrhoids  Reproductive/Obstetrics negative OB ROS                             Anesthesia Physical  Anesthesia Plan  ASA:  III  Anesthesia Plan: General   Post-op Pain Management:    Induction: Intravenous  PONV Risk Score and Plan: TIVA and Propofol infusion  Airway Management Planned: Nasal Cannula  Additional Equipment:   Intra-op Plan:   Post-operative Plan:   Informed Consent: I have reviewed the patients History and Physical, chart, labs and discussed the procedure including the risks, benefits and alternatives for the proposed anesthesia with the patient or authorized representative who has indicated his/her understanding and acceptance.     Dental Advisory Given  Plan Discussed with: Anesthesiologist, CRNA and Surgeon  Anesthesia Plan Comments:         Anesthesia Quick Evaluation

## 2020-05-12 NOTE — Transfer of Care (Signed)
Immediate Anesthesia Transfer of Care Note  Patient: [name] [name]  Procedure(s) Performed: COLONOSCOPY WITH PROPOFOL (N/A )  Patient Location: PACU and Endoscopy Unit  Anesthesia Type:General  Level of Consciousness: drowsy and patient cooperative  Airway & Oxygen Therapy: Patient Spontanous Breathing  Post-op Assessment: Report given to RN and Post -op Vital signs reviewed and stable  Post vital signs: Reviewed and stable  Last Vitals:  Vitals Value Taken Time  BP 141/76 05/12/20 1048  Temp 36.8 C 05/12/20 1048  Pulse 76 05/12/20 1049  Resp 17 05/12/20 1049  SpO2 100 % 05/12/20 1049  Vitals shown include unvalidated device data.  Last Pain:  Vitals:   05/12/20 1048  TempSrc:   PainSc: Asleep         Complications: No complications documented.

## 2020-05-12 NOTE — Anesthesia Postprocedure Evaluation (Signed)
Anesthesia Post Note  Patient: [name] [name]  Procedure(s) Performed: COLONOSCOPY WITH PROPOFOL (N/A )  Patient location during evaluation: Endoscopy Anesthesia Type: General Level of consciousness: awake and alert and oriented Pain management: pain level controlled Vital Signs Assessment: post-procedure vital signs reviewed and stable Respiratory status: spontaneous breathing Cardiovascular status: blood pressure returned to baseline Anesthetic complications: no   No complications documented.   Last Vitals:  Vitals:   05/12/20 1058 05/12/20 1108  BP: (!) 175/79 (!) 166/71  Pulse: 78 79  Resp: (!) 26 (!) 21  Temp:    SpO2: 100% 100%    Last Pain:  Vitals:   05/12/20 1108  TempSrc:   PainSc: 0-No pain                 Kais Monje

## 2020-05-12 NOTE — H&P (Signed)
Cephas Darby, MD [address]  [place]  [place], [place] [address]  Main: [phone]  Fax: [phone] Pager: [phone]  Primary Care Physician:  [name], MD Primary Gastroenterologist:  Dr. Cephas Darby  Pre-Procedure History & Physical: HPI:  [name] [name] is a 87 y.o. female is here for an colonoscopy.   Past Medical History:  Diagnosis Date  . Anxiety   . Cancer ([place])    Squamous Cell Carcinoma (top of head)  . Constipation, chronic   . Deaf, left   . Diverticular disease   . Hearing aid worn   . Hearing deficit   . History of Bell's palsy    left side - resolved - 50 yrs ago  . Hypertension   . Internal hemorrhoids   . Osteopenia   . Pure hypercholesterolemia     Past Surgical History:  Procedure Laterality Date  . CATARACT EXTRACTION W/PHACO Left 11/16/2015   Procedure: CATARACT EXTRACTION PHACO AND INTRAOCULAR LENS PLACEMENT (IOC);  Surgeon: Leandrew Koyanagi, MD;  Location: [facility];  Service: Ophthalmology;  Laterality: Left;  TORIC  . CATARACT EXTRACTION W/PHACO Right 12/07/2015   Procedure: CATARACT EXTRACTION PHACO AND INTRAOCULAR LENS PLACEMENT (IOC);  Surgeon: Leandrew Koyanagi, MD;  Location: [place];  Service: Ophthalmology;  Laterality: Right;  TORIC  . COLONOSCOPY WITH PROPOFOL N/A 08/10/2014   Procedure: COLONOSCOPY WITH PROPOFOL;  Surgeon: Lollie Sails, MD;  Location[facility] ENDOSCOPY;  Service: Endoscopy;  Laterality: N/A;  . ESOPHAGOGASTRODUODENOSCOPY (EGD) WITH PROPOFOL N/A 03/28/2020   Procedure: ESOPHAGOGASTRODUODENOSCOPY (EGD) WITH PROPOFOL;  Surgeon: Lucilla Lame, MD;  Location: [facility] ENDOSCOPY;  Service: Endoscopy;  Laterality: N/A;  . EYE SURGERY    . FEMUR FRACTURE SURGERY    . FRACTURE SURGERY    . GIVENS CAPSULE STUDY N/A 04/19/2020   Procedure: GIVENS CAPSULE STUDY;  Surgeon: Lin Landsman, MD;  Location: [facility] ENDOSCOPY;  Service: Gastroenterology;  Laterality: N/A;  .  Nasal Polyp Removal      Prior to Admission medications   Medication Sig Start Date End Date Taking? Authorizing Provider  acetaminophen (TYLENOL) 500 MG tablet Take 1 tablet (500 mg total) by mouth every 6 (six) hours as needed. 04/02/20  Yes Enzo Bi, MD  amiodarone (PACERONE) 200 MG tablet Take 1 tablet (200 mg total) by mouth daily. 01/17/20  Yes Nicole Kindred A, DO  B Complex-C (B-COMPLEX WITH VITAMIN C) tablet Take 1 tablet by mouth daily.   Yes [provider]  calcium carbonate 1250 MG capsule Take 2 capsules by mouth 2 (two) times daily with a meal.   Yes [provider]  Cholecalciferol 25 MCG (1000 UT) tablet Take 1 tablet by mouth in the morning and at bedtime.   Yes [provider]  Cholecalciferol 25 MCG (1000 UT) tablet Take by mouth.   Yes [provider]  digoxin (LANOXIN) 0.125 MG tablet Take 62.5 mcg by mouth daily. 01/16/20  Yes [provider]  furosemide (LASIX) 40 MG tablet Take 1 tablet (40 mg total) by mouth daily. 01/17/20  Yes Nicole Kindred A, DO  lovastatin (MEVACOR) 40 MG tablet Take 40 mg by mouth at bedtime.   Yes [provider]  metoprolol tartrate (LOPRESSOR) 50 MG tablet Take 1 tablet (50 mg total) by mouth 2 (two) times daily. 01/16/20  Yes Nicole Kindred A, DO  pantoprazole (PROTONIX) 40 MG tablet Take 1 tablet (40 mg total) by mouth 2 (two) times daily before a meal. 04/02/20 07/01/20 Yes  Enzo Bi, MD  polyethylene glycol powder [facility]) 17 GM/SCOOP powder Take by mouth.   Yes [provider]  Rivaroxaban (XARELTO) 15 MG TABS tablet Take 1 tablet (15 mg total) by mouth daily with supper. 01/16/20  Yes Nicole Kindred A, DO  triamcinolone (KENALOG) 0.1 % Apply topically 2 (two) times daily. 03/24/20 03/24/21 Yes [provider]  verapamil (CALAN-SR) 180 MG CR tablet Take 180 mg by mouth daily. 09/25/19 09/24/20 Yes [provider]  iron polysaccharides (NIFEREX) 150 MG  capsule Take 1 capsule (150 mg total) by mouth daily. Patient not taking: No sig reported 01/03/20   Sharen Hones, MD  lovastatin (MEVACOR) 40 MG tablet Take 1 tablet by mouth at bedtime. 11/18/19   [provider]    Allergies as of 05/06/2020 - Review Complete 04/18/2020  Allergen Reaction Noted  . Metoprolol Swelling 09/25/2019  . Bactrim [sulfamethoxazole-trimethoprim] Other (See Comments) 08/09/2014  . Ciprofloxacin Other (See Comments) 08/09/2014  . Pneumococcal vaccines Other (See Comments) 07/26/2019    Family History  Problem Relation Age of Onset  . Colon cancer Mother     Social History   Socioeconomic History  . Marital status: Widowed    Spouse name: Not on file  . Number of children: Not on file  . Years of education: Not on file  . Highest education level: Not on file  Occupational History  . Not on file  Tobacco Use  . Smoking status: Former Smoker    Quit date: 10/30/1985    Years since quitting: 34.5  . Smokeless tobacco: Never Used  Vaping Use  . Vaping Use: Never used  Substance and Sexual Activity  . Alcohol use: No  . Drug use: No  . Sexual activity: Not on file  Other Topics Concern  . Not on file  Social History Narrative  . Not on file   Social Determinants of Health   Financial Resource Strain: Not on file  Food Insecurity: Not on file  Transportation Needs: Not on file  Physical Activity: Not on file  Stress: Not on file  Social Connections: Not on file  Intimate Partner Violence: Not on file    Review of Systems: See HPI, otherwise negative ROS  Physical Exam: BP (!) 167/78   Pulse 75   Temp (!) 97.4 F (36.3 C) (Temporal)   Resp 16   Ht 5\' 3"  (1.6 m)   Wt 52.3 kg   SpO2 96%   BMI 20.41 kg/m  General:   Alert,  pleasant and cooperative in NAD Head:  Normocephalic and atraumatic. Neck:  Supple; no masses or thyromegaly. Lungs:  Clear throughout to auscultation.    Heart:  Regular rate and rhythm. Abdomen:   Soft, nontender and nondistended. Normal bowel sounds, without guarding, and without rebound.   Neurologic:  Alert and  oriented x4;  grossly normal neurologically.  Impression/Plan: [name] [name] is here for an colonoscopy to be performed for abnormal VCE, possible colon mass  Risks, benefits, limitations, and alternatives regarding  colonoscopy have been reviewed with the patient.  Questions have been answered.  All parties agreeable.   Sherri Sear, MD  05/12/2020, 10:14 AM

## 2020-05-12 NOTE — Anesthesia Procedure Notes (Signed)
Procedure Name: MAC Date/Time: 05/05/2020 10:22 AM Performed by: Jerrye Noble, CRNA Pre-anesthesia Checklist: Patient identified, Emergency Drugs available, Suction available and Patient being monitored Patient Re-evaluated:Patient Re-evaluated prior to induction Oxygen Delivery Method: Nasal cannula

## 2020-05-12 NOTE — Op Note (Signed)
[facility] Gastroenterology Patient Name: [name] [name] Procedure Date: 05/12/2020 10:10 AM MRN: [id number] Account #: [id number] Date of Birth: [birth year] Admit Type: Outpatient Age: 87 Room: [facility] ENDO ROOM 4 Gender: Female Note Status: Finalized Procedure:             Colonoscopy Indications:           Iron deficiency anemia secondary to chronic blood                         loss, Unexplained iron deficiency anemia, Abnormal                         video capsule endoscopy Providers:             Lin Landsman MD, MD Referring MD:          [name] (Referring MD) Medicines:             General Anesthesia Complications:         No immediate complications. Estimated blood loss:                         Minimal. Procedure:             Pre-Anesthesia Assessment:                        - Prior to the procedure, a History and Physical was                         performed, and patient medications and allergies were                         reviewed. The patient is competent. The risks and                         benefits of the procedure and the sedation options and                         risks were discussed with the patient. All questions                         were answered and informed consent was obtained.                         Patient identification and proposed procedure were                         verified by the physician, the nurse, the                         anesthesiologist, the anesthetist and the technician                         in the pre-procedure area in the procedure room in the                         endoscopy suite. Mental Status Examination: alert and  oriented. Airway Examination: normal oropharyngeal                         airway and neck mobility. Respiratory Examination:                         clear to auscultation. CV Examination: irregularly                         irregular rate and rhythm.  Prophylactic Antibiotics:                         The patient does not require prophylactic antibiotics.                         Prior Anticoagulants: The patient has taken Xarelto                         (rivaroxaban), last dose was 3 days prior to                         procedure. ASA Grade Assessment: III - A patient with                         severe systemic disease. After reviewing the risks and                         benefits, the patient was deemed in satisfactory                         condition to undergo the procedure. The anesthesia                         plan was to use general anesthesia. Immediately prior                         to administration of medications, the patient was                         re-assessed for adequacy to receive sedatives. The                         heart rate, respiratory rate, oxygen saturations,                         blood pressure, adequacy of pulmonary ventilation, and                         response to care were monitored throughout the                         procedure. The physical status of the patient was                         re-assessed after the procedure.                        After obtaining informed consent, the colonoscope was  passed under direct vision. Throughout the procedure,                         the patient's blood pressure, pulse, and oxygen                         saturations were monitored continuously. The                         Colonoscope was introduced through the anus and                         advanced to the the cecum, identified by appendiceal                         orifice and ileocecal valve. The colonoscopy was                         performed without difficulty. The patient tolerated                         the procedure well. The quality of the bowel                         preparation was evaluated using the BBPS [facility] Bowel                         Preparation Scale)  with scores of: Right Colon = 3,                         Transverse Colon = 3 and Left Colon = 3 (entire mucosa                         seen well with no residual staining, small fragments                         of stool or opaque liquid). The total BBPS score                         equals 9. Findings:      Skin tags were found on perianal exam.      A frond-like/villous, infiltrative and ulcerated non-obstructing large       mass was found at the ileocecal valve. The mass was circumferential.       Oozing was present. Biopsies were taken with a cold forceps for       histology. Estimated blood loss was minimal.      A 6 mm polyp was found in the ascending colon. The polyp was sessile.       The polyp was removed with a cold snare. Resection and retrieval were       complete.      A 15 mm polyp was found in the distal rectum, right at the anal verge.       The polyp was semi-pedunculated. The polyp was removed with a hot snare.       Resection and retrieval were complete.      Non-bleeding external hemorrhoids were found during retroflexion. The       hemorrhoids were  large.      A few diverticula were found in the sigmoid colon.      Blood stained liquid yellow stool was found in the right colon.      Post banding scars were found in the distal rectum. The scar tissue was       healthy in appearance. Impression:            - Perianal skin tags found on perianal exam.                        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 Biopsied.                        - One 6 mm polyp in the ascending colon, removed with                         a cold snare. Resected and retrieved.                        - One 15 mm polyp in the distal rectum, removed with a                         hot snare. Resected and retrieved.                        - Non-bleeding external hemorrhoids.                        - Diverticulosis in the sigmoid colon.                        - Blood in the entire  examined colon. Recommendation:        - Discharge patient to home (with escort).                        - Resume previous diet today.                        - Continue present medications.                        - Await pathology results.                        - Resume Xarelto (rivaroxaban) in 3 days at prior                         dose. Refer to managing physician for further                         adjustment of therapy.                        - Refer to an oncologist at appointment to be                         scheduled. Procedure Code(s):     --- Professional ---  45385, Colonoscopy, flexible; with removal of                         tumor(s), polyp(s), or other lesion(s) by snare                         technique                        45380, 40, Colonoscopy, flexible; with biopsy, single                         or multiple Diagnosis Code(s):     --- Professional ---                        C18.0, Malignant neoplasm of cecum                        K63.5, Polyp of colon                        K62.1, Rectal polyp                        K64.4, Residual hemorrhoidal skin tags                        K92.2, Gastrointestinal hemorrhage, unspecified                        D50.0, Iron deficiency anemia secondary to blood loss                         (chronic)                        D50.9, Iron deficiency anemia, unspecified                        K57.30, Diverticulosis of large intestine without                         perforation or abscess without bleeding                        R93.3, Abnormal findings on diagnostic imaging of                         other parts of digestive tract CPT copyright 2019 American Medical Association. All rights reserved. The codes documented in this report are preliminary and upon coder review may  be revised to meet current compliance requirements. Dr. Ulyess Mort Lin Landsman MD, MD 05/12/2020 10:53:23 AM This report has  been signed electronically. Number of Addenda: 0 Note Initiated On: 05/12/2020 10:10 AM Scope Withdrawal Time: 0 hours 10 minutes 55 seconds  Total Procedure Duration: 0 hours 16 minutes 36 seconds  Estimated Blood Loss:  Estimated blood loss was minimal.      [facility]

## 2020-05-13 ENCOUNTER — Encounter: Payer: Self-pay | Admitting: Gastroenterology

## 2020-05-13 LAB — SURGICAL PATHOLOGY

## 2020-05-16 NOTE — Progress Notes (Signed)
Called patient to discuss about the pathology results.  Informed patient's son who is the power of attorney that the ileocecal mass pathology results confirmed malignancy.  Patient has an appointment to see Dr. Janese Banks on 4/26 to discuss further management  Lattie Haw Please go ahead and cancel her follow-up appointment with me on 6/15  [facility]

## 2020-05-20 ENCOUNTER — Ambulatory Visit: Payer: Medicare Other | Admitting: Oncology

## 2020-05-24 ENCOUNTER — Inpatient Hospital Stay (HOSPITAL_BASED_OUTPATIENT_CLINIC_OR_DEPARTMENT_OTHER): Payer: Medicare Other | Admitting: Oncology

## 2020-05-24 ENCOUNTER — Encounter: Payer: Self-pay | Admitting: Oncology

## 2020-05-24 ENCOUNTER — Telehealth: Payer: Self-pay

## 2020-05-24 ENCOUNTER — Telehealth: Payer: Self-pay | Admitting: *Deleted

## 2020-05-24 ENCOUNTER — Inpatient Hospital Stay: Payer: Medicare Other

## 2020-05-24 VITALS — BP 169/68 | HR 83 | Temp 99.9°F | Resp 20 | Wt 118.6 lb

## 2020-05-24 DIAGNOSIS — D500 Iron deficiency anemia secondary to blood loss (chronic): Secondary | ICD-10-CM | POA: Diagnosis not present

## 2020-05-24 DIAGNOSIS — C182 Malignant neoplasm of ascending colon: Secondary | ICD-10-CM

## 2020-05-24 DIAGNOSIS — C189 Malignant neoplasm of colon, unspecified: Secondary | ICD-10-CM

## 2020-05-24 DIAGNOSIS — D509 Iron deficiency anemia, unspecified: Secondary | ICD-10-CM | POA: Diagnosis not present

## 2020-05-24 LAB — CBC WITH DIFFERENTIAL/PLATELET
Abs Immature Granulocytes: 0.04 10*3/uL (ref 0.00–0.07)
Basophils Absolute: 0.1 10*3/uL (ref 0.0–0.1)
Basophils Relative: 1 %
Eosinophils Absolute: 0.1 10*3/uL (ref 0.0–0.5)
Eosinophils Relative: 1 %
HCT: 35.1 % — ABNORMAL LOW (ref 36.0–46.0)
Hemoglobin: 10.8 g/dL — ABNORMAL LOW (ref 12.0–15.0)
Immature Granulocytes: 0 %
Lymphocytes Relative: 8 %
Lymphs Abs: 0.7 10*3/uL (ref 0.7–4.0)
MCH: 29.3 pg (ref 26.0–34.0)
MCHC: 30.8 g/dL (ref 30.0–36.0)
MCV: 95.4 fL (ref 80.0–100.0)
Monocytes Absolute: 1.4 10*3/uL — ABNORMAL HIGH (ref 0.1–1.0)
Monocytes Relative: 14 %
Neutro Abs: 7.3 10*3/uL (ref 1.7–7.7)
Neutrophils Relative %: 76 %
Platelets: 294 10*3/uL (ref 150–400)
RBC: 3.68 MIL/uL — ABNORMAL LOW (ref 3.87–5.11)
RDW: 21.9 % — ABNORMAL HIGH (ref 11.5–15.5)
WBC: 9.6 10*3/uL (ref 4.0–10.5)
nRBC: 0 % (ref 0.0–0.2)

## 2020-05-24 LAB — COMPREHENSIVE METABOLIC PANEL
ALT: 17 U/L (ref 0–44)
AST: 24 U/L (ref 15–41)
Albumin: 3.4 g/dL — ABNORMAL LOW (ref 3.5–5.0)
Alkaline Phosphatase: 72 U/L (ref 38–126)
Anion gap: 11 (ref 5–15)
BUN: 12 mg/dL (ref 8–23)
CO2: 27 mmol/L (ref 22–32)
Calcium: 8.9 mg/dL (ref 8.9–10.3)
Chloride: 103 mmol/L (ref 98–111)
Creatinine, Ser: 0.66 mg/dL (ref 0.44–1.00)
GFR, Estimated: >60 mL/min (ref >60)
Glucose, Bld: 115 mg/dL — ABNORMAL HIGH (ref 70–99)
Potassium: 3 mmol/L — ABNORMAL LOW (ref 3.5–5.1)
Sodium: 141 mmol/L (ref 135–145)
Total Bilirubin: 0.6 mg/dL (ref 0.3–1.2)
Total Protein: 7.3 g/dL (ref 6.5–8.1)

## 2020-05-24 MED ORDER — POTASSIUM CHLORIDE CRYS ER 20 MEQ PO TBCR: 20.0000 meq | EXTENDED_RELEASE_TABLET | Freq: Every day | ORAL | 0 refills | Status: DC

## 2020-05-24 NOTE — Progress Notes (Signed)
Hematology/Oncology Consult note [facility]  Telephone:([phone] [phone]  Patient Care Team: [name], MD as PCP - General (Family Medicine)   Name of the patient: [name] [name]  [id number]  02-Jul-1929   Date of visit: 05/24/20  Diagnosis-iron deficiency anemia secondary to bleeding from mass in the ileocecal valve.  Biopsy-proven adenocarcinoma  Chief complaint/ Reason for visit-discuss biopsy results and further management  Heme/Onc history: Patient is a 87 year old female with a past medical history significant for hypertension, osteopenia, hyperlipidemia, atrial fibrillation on Xarelto referred for iron deficiency anemia patient's most recent CBC showed white count of 8.8, H&H of 9.4/31.6 with an MCV of 86 and a platelet count of 308.  Iron studies on 03/25/2020 showed high a normal TIBC of 403 and iron saturation of 6%.  Ferritin levels were low at 17.  B12 levels back in December 2021 were normal at 1021.  She also underwent EGD and colonoscopy with Dr. Marius Ditch.  EGD was unremarkable but colonoscopy showed a partially obstructing mass at the ileocecal valve with oozing present.  This was biopsied and consistent with adenocarcinoma.  She had other polyps noted in the colon and rectum as well which were biopsied and was negative for dysplasia or malignancy.  Patient received 5 doses of Venna for for iron deficiency anemia up until 05/09/2020.  Interval history-patient has ongoing issues with bilateral lower extremity swelling as well as left lower extremity wounds for which she follows up with wound clinic.  Presently denies any blood in her stool or dark melanotic stools.  ECOG PS- 2 Pain scale- 2   Review of systems- Review of Systems  Constitutional: Positive for malaise/fatigue. Negative for chills, fever and weight loss.  HENT: Negative for congestion, ear discharge and nosebleeds.   Eyes: Negative for blurred vision.   Respiratory: Negative for cough, hemoptysis, sputum production, shortness of breath and wheezing.   Cardiovascular: Negative for chest pain, palpitations, orthopnea and claudication.  Gastrointestinal: Negative for abdominal pain, blood in stool, constipation, diarrhea, heartburn, melena, nausea and vomiting.  Genitourinary: Negative for dysuria, flank pain, frequency, hematuria and urgency.  Musculoskeletal: Negative for back pain, joint pain and myalgias.       Left lower extremity wounds  Skin: Negative for rash.  Neurological: Negative for dizziness, tingling, focal weakness, seizures, weakness and headaches.  Endo/Heme/Allergies: Does not bruise/bleed easily.  Psychiatric/Behavioral: Negative for depression and suicidal ideas. The patient does not have insomnia.      Allergies  Allergen Reactions  . Metoprolol Swelling    Feet, ankles and legs   . Bactrim [Sulfamethoxazole-Trimethoprim] Other (See Comments)    Hearing loss  . Ciprofloxacin Other (See Comments)    Flu-like symptoms  . Pneumococcal Vaccines Other (See Comments)    Flu-like symptoms     Past Medical History:  Diagnosis Date  . Anxiety   . Cancer ([place])    Squamous Cell Carcinoma (top of head)  . Constipation, chronic   . Deaf, left   . Diverticular disease   . Hearing aid worn   . Hearing deficit   . History of Bell's palsy    left side - resolved - 50 yrs ago  . Hypertension   . Internal hemorrhoids   . Osteopenia   . Pure hypercholesterolemia      Past Surgical History:  Procedure Laterality Date  . CATARACT EXTRACTION W/PHACO Left 11/16/2015   Procedure: CATARACT EXTRACTION PHACO AND INTRAOCULAR LENS PLACEMENT (IOC);  Surgeon: Leandrew Koyanagi, MD;  Location: [place];  Service: Ophthalmology;  Laterality: Left;  TORIC  . CATARACT EXTRACTION W/PHACO Right 12/07/2015   Procedure: CATARACT EXTRACTION PHACO AND INTRAOCULAR LENS PLACEMENT (IOC);  Surgeon: Leandrew Koyanagi, MD;   Location: [place];  Service: Ophthalmology;  Laterality: Right;  TORIC  . COLONOSCOPY WITH PROPOFOL N/A 08/10/2014   Procedure: COLONOSCOPY WITH PROPOFOL;  Surgeon: Lollie Sails, MD;  Location: [facility] ENDOSCOPY;  Service: Endoscopy;  Laterality: N/A;  . COLONOSCOPY WITH PROPOFOL N/A 05/12/2020   Procedure: COLONOSCOPY WITH PROPOFOL;  Surgeon: Lin Landsman, MD;  Location: [facility] ENDOSCOPY;  Service: Gastroenterology;  Laterality: N/A;  . ESOPHAGOGASTRODUODENOSCOPY (EGD) WITH PROPOFOL N/A 03/28/2020   Procedure: ESOPHAGOGASTRODUODENOSCOPY (EGD) WITH PROPOFOL;  Surgeon: Lucilla Lame, MD;  Location: [facility] ENDOSCOPY;  Service: Endoscopy;  Laterality: N/A;  . EYE SURGERY    . FEMUR FRACTURE SURGERY    . FRACTURE SURGERY    . GIVENS CAPSULE STUDY N/A 04/19/2020   Procedure: GIVENS CAPSULE STUDY;  Surgeon: Lin Landsman, MD;  [facility];  Service: Gastroenterology;  Laterality: N/A;  . Nasal Polyp Removal      Social History   Socioeconomic History  . Marital status: Widowed    Spouse name: Not on file  . Number of children: Not on file  . Years of education: Not on file  . Highest education level: Not on file  Occupational History  . Not on file  Tobacco Use  . Smoking status: Former Smoker    Quit date: 10/30/1985    Years since quitting: 34.5  . Smokeless tobacco: Never Used  Vaping Use  . Vaping Use: Never used  Substance and Sexual Activity  . Alcohol use: No  . Drug use: No  . Sexual activity: Not on file  Other Topics Concern  . Not on file  Social History Narrative  . Not on file   Social Determinants of Health   Financial Resource Strain: Not on file  Food Insecurity: Not on file  Transportation Needs: Not on file  Physical Activity: Not on file  Stress: Not on file  Social Connections: Not on file  Intimate Partner Violence: Not on file    Family History  Problem Relation Age of Onset  . Colon cancer Mother      Current  Outpatient Medications:  .  acetaminophen (TYLENOL) 500 MG tablet, Take 1 tablet (500 mg total) by mouth every 6 (six) hours as needed., Disp: 30 tablet, Rfl: 0 .  amiodarone (PACERONE) 200 MG tablet, Take 1 tablet (200 mg total) by mouth daily., Disp: 30 tablet, Rfl: 1 .  B Complex-C (B-COMPLEX WITH VITAMIN C) tablet, Take 1 tablet by mouth daily., Disp: , Rfl:  .  calcium carbonate 1250 MG capsule, Take 2 capsules by mouth 2 (two) times daily with a meal., Disp: , Rfl:  .  Cholecalciferol 25 MCG (1000 UT) tablet, Take 1 tablet by mouth in the morning and at bedtime., Disp: , Rfl:  .  Cholecalciferol 25 MCG (1000 UT) tablet, Take by mouth., Disp: , Rfl:  .  digoxin (LANOXIN) 0.125 MG tablet, Take 62.5 mcg by mouth daily., Disp: , Rfl:  .  furosemide (LASIX) 40 MG tablet, Take 1 tablet (40 mg total) by mouth daily., Disp: 30 tablet, Rfl: 1 .  iron polysaccharides (NIFEREX) 150 MG capsule, Take 1 capsule (150 mg total) by mouth daily. (Patient not taking: No sig reported), Disp: 30 capsule, Rfl: 0 .  lovastatin (MEVACOR) 40 MG tablet, Take  40 mg by mouth at bedtime., Disp: , Rfl:  .  lovastatin (MEVACOR) 40 MG tablet, Take 1 tablet by mouth at bedtime., Disp: , Rfl:  .  metoprolol tartrate (LOPRESSOR) 50 MG tablet, Take 1 tablet (50 mg total) by mouth 2 (two) times daily., Disp: 60 tablet, Rfl: 1 .  pantoprazole (PROTONIX) 40 MG tablet, Take 1 tablet (40 mg total) by mouth 2 (two) times daily before a meal., Disp: 60 tablet, Rfl: 2 .  polyethylene glycol powder (GLYCOLAX/MIRALAX) 17 GM/SCOOP powder, Take by mouth., Disp: , Rfl:  .  Rivaroxaban (XARELTO) 15 MG TABS tablet, Take 1 tablet (15 mg total) by mouth daily with supper., Disp: 42 tablet, Rfl: 1 .  triamcinolone (KENALOG) 0.1 %, Apply topically 2 (two) times daily., Disp: , Rfl:  .  verapamil (CALAN-SR) 180 MG CR tablet, Take 180 mg by mouth daily., Disp: , Rfl:   Physical exam:  Vitals:   05/24/20 1137  BP: (!) 169/68  Pulse: 83  Resp:  20  Temp: 99.9 F (37.7 C)  TempSrc: Tympanic  SpO2: 97%  Weight: 118 lb 9.6 oz (53.8 kg)   Physical Exam Constitutional:      General: She is not in acute distress. Cardiovascular:     Rate and Rhythm: Rhythm irregular.  Pulmonary:     Effort: Pulmonary effort is normal.  Abdominal:     General: Bowel sounds are normal.     Palpations: Abdomen is soft.  Musculoskeletal:     Comments: Bilateral +1 lower extremity edema.  Dressing in place over left lower extremity wounds  Skin:    General: Skin is warm and dry.  Neurological:     Mental Status: She is alert and oriented to person, place, and time.      CMP Latest Ref Rng & Units 05/24/2020  Glucose 70 - 99 mg/dL 115(H)  BUN 8 - 23 mg/dL 12  Creatinine 0.44 - 1.00 mg/dL 0.66  Sodium 135 - 145 mmol/L 141  Potassium 3.5 - 5.1 mmol/L 3.0(L)  Chloride 98 - 111 mmol/L 103  CO2 22 - 32 mmol/L 27  Calcium 8.9 - 10.3 mg/dL 8.9  Total Protein 6.5 - 8.1 g/dL 7.3  Total Bilirubin 0.3 - 1.2 mg/dL 0.6  Alkaline Phos 38 - 126 U/L 72  AST 15 - 41 U/L 24  ALT 0 - 44 U/L 17   CBC Latest Ref Rng & Units 05/24/2020  WBC 4.0 - 10.5 K/uL 9.6  Hemoglobin 12.0 - 15.0 g/dL 10.8(L)  Hematocrit 36.0 - 46.0 % 35.1(L)  Platelets 150 - 400 K/uL 294      Assessment and plan- Patient is a 87 y.o. female referred for iron deficiency anemia found to have partially obstructing mass at the ileocecal valve which is biopsy-proven adenocarcinoma  Discussed with the patient and her son that the cause of her iron deficiency anemia is likely secondary to intermittent bleeding from the partially obstructive mass at the ileocecal valve.  This was biopsy-proven adenocarcinoma.  I would recommend getting a surgical opinion to get this mass resected.  There is a potential that if this mass is not taken out surgically it may need to bowel obstruction in the future.  Patient's son prefers to see colorectal surgery and I will refer them to [facility] surgery  for this.  Patient did have a CT chest with contrast in February 2022 which did not show any evidence of metastatic disease.  I will obtain a CT abdomen and pelvis with  contrast  to complete her staging work-up.  We will also check a CBC with differential CMP and CEA today.  Iron deficiency anemia: T secondary to bleeding from ileocecal valve mass.  She has received 5 doses of IV iron.  Repeat labs in 2 months and see me thereafter   Visit Diagnosis 1. Malignant neoplasm of ascending colon (HCC)      Dr. Randa Evens, MD, MPH [facility] at [facility] [phone] 05/24/2020 1:08 PM

## 2020-05-24 NOTE — Telephone Encounter (Signed)
Faxed request to surgery for surgical opinion for pt with ILEOCECAL VALVE, MASS and was  dx:- INVASIVE COLONIC ADENOCARCINOMA. Th fax transmission did got through

## 2020-05-24 NOTE — Telephone Encounter (Signed)
-----   Message from Sindy Guadeloupe, MD sent at 05/24/2020  1:13 PM EDT ----- Please send prescription for potassium 20 mEq daily for 1 week

## 2020-05-24 NOTE — Telephone Encounter (Signed)
Spoke with Patient and son  And they are aware and will pick up potassium [facility]

## 2020-05-25 LAB — CEA: CEA: 23.7 ng/mL — ABNORMAL HIGH (ref 0.0–4.7)

## 2020-06-01 DIAGNOSIS — C182 Malignant neoplasm of ascending colon: Secondary | ICD-10-CM | POA: Diagnosis present

## 2020-06-06 ENCOUNTER — Telehealth: Payer: Self-pay | Admitting: *Deleted

## 2020-06-06 NOTE — Telephone Encounter (Signed)
I called to make sure that pt has appt to see dr white. I was told by staff that her appt is 5/16 and the office has already informed pt about appt date and time

## 2020-06-07 ENCOUNTER — Ambulatory Visit: Payer: Medicare Other | Admitting: Oncology

## 2020-06-07 ENCOUNTER — Other Ambulatory Visit: Payer: Medicare Other

## 2020-06-08 ENCOUNTER — Ambulatory Visit: Admission: RE | Admit: 2020-06-08 | Payer: Medicare Other | Source: Ambulatory Visit

## 2020-06-08 ENCOUNTER — Telehealth: Payer: Self-pay | Admitting: Oncology

## 2020-06-08 NOTE — Telephone Encounter (Signed)
Returned phone call to patient's son, Percell Miller. He left a VM with the answering service stating that he was not able to pick up contrast before outpatient imaging services closed, therefore patient missed CT scan today. When calling central scheduling I was notified that they are not scheduling CTs at this time due to the national contrast shortage and to call back on "Monday or Friday for an update". Patient's son was made aware of the situation.

## 2020-06-10 ENCOUNTER — Other Ambulatory Visit: Payer: Self-pay | Admitting: *Deleted

## 2020-06-10 ENCOUNTER — Telehealth: Payer: Self-pay | Admitting: Oncology

## 2020-06-10 DIAGNOSIS — C182 Malignant neoplasm of ascending colon: Secondary | ICD-10-CM

## 2020-06-10 NOTE — Telephone Encounter (Addendum)
06/10/2020 Scan r/s for 6/14 at 9 am @ [facility]. Spoke w/ pts son and informed him that pt needs to pick up oral prep and instructions before hand, arrive by 8:45, and be NPO 4 hours prior. Pts son confirmed appt and instructions SRW     ----- Message from Rochele Raring sent at 06/08/2020  4:50 PM EDT ----- Regarding: Team Janese Banks Reminder: contrast shortage Patient missed CT on 5/11---Please call Central scheduling to see if they are scheduling and update son Percell Miller.   Thanks, Anderson Malta :)

## 2020-06-14 ENCOUNTER — Telehealth: Payer: Self-pay | Admitting: Oncology

## 2020-06-14 NOTE — Telephone Encounter (Signed)
Left VM with patient's son to notify him of rescheduled CT scan on 5/20. Left instructions and direct extension for him to return phone call to confirm.

## 2020-06-15 ENCOUNTER — Telehealth: Payer: Self-pay | Admitting: Oncology

## 2020-06-15 NOTE — Telephone Encounter (Signed)
Attempt made to call patient to confirm rescheduled CT scan on 5/10. No answer and mailbox is full.

## 2020-06-17 ENCOUNTER — Ambulatory Visit
Admission: RE | Admit: 2020-06-17 | Discharge: 2020-06-17 | Disposition: A | Discharge status: Home / Self Care | Payer: Medicare Other | Source: Ambulatory Visit | Attending: Oncology | Admitting: Oncology

## 2020-06-17 ENCOUNTER — Other Ambulatory Visit: Payer: Self-pay

## 2020-06-17 DIAGNOSIS — C182 Malignant neoplasm of ascending colon: Secondary | ICD-10-CM

## 2020-06-20 ENCOUNTER — Telehealth: Payer: Self-pay | Admitting: *Deleted

## 2020-06-20 NOTE — Telephone Encounter (Signed)
[name] [name] called asking how they will get patient CT results. Her next appointment is not until 07/15/20  IMPRESSION: 1. Asymmetric wall thickening in the proximal ascending colon suspicious for primary neoplasm, with a single enlarged regional mesenteric lymph node. 2. No evidence of distal metastatic disease on this unenhanced study. 3. Stable pleural effusions left worse than right. 4. Coronary and Aortic Atherosclerosis (ICD10-170.0). 5. Small hiatal hernia. 6. Descending and sigmoid diverticulosis.   Electronically Signed   By: Lucrezia Europe M.D.   On: 06/18/2020 12:07

## 2020-06-21 ENCOUNTER — Encounter: Payer: Self-pay | Admitting: Oncology

## 2020-06-21 NOTE — Telephone Encounter (Signed)
Call returned to son and informed of doctor response.

## 2020-06-21 NOTE — Telephone Encounter (Signed)
[name] [name] -can you please let the son know that we are not seeing any cancer outside of the colon which is good news.  Although there was 1 lymph node that was suspicious it would not change the plan for surgery at this time.  Plan would still be to proceed with surgery as planned and I have informed [place] surgery Dr. Dema Severin about her CT scan results.  He is currently awaiting cardiology clearance before proceeding with surgery

## 2020-06-21 NOTE — Telephone Encounter (Signed)
Son has called again asking for results

## 2020-06-28 ENCOUNTER — Telehealth: Payer: Self-pay | Admitting: Gastroenterology

## 2020-06-28 NOTE — Telephone Encounter (Signed)
Patient would like call back regarding results of CT scan.

## 2020-06-28 NOTE — Telephone Encounter (Signed)
Please advised Dr. Janese Banks order the ct scan

## 2020-06-30 NOTE — Telephone Encounter (Signed)
Called and reviewed the results with patient's son. Told him that Dr Hedwig Morton will discuss next steps after she returns from vacation   [place]

## 2020-07-12 ENCOUNTER — Ambulatory Visit: Payer: Medicare Other

## 2020-07-13 ENCOUNTER — Ambulatory Visit: Payer: Medicare Other | Admitting: Gastroenterology

## 2020-07-15 ENCOUNTER — Other Ambulatory Visit: Payer: Self-pay

## 2020-07-15 ENCOUNTER — Inpatient Hospital Stay (HOSPITAL_BASED_OUTPATIENT_CLINIC_OR_DEPARTMENT_OTHER): Payer: Medicare Other | Admitting: Oncology

## 2020-07-15 ENCOUNTER — Inpatient Hospital Stay: Payer: Medicare Other | Attending: Oncology

## 2020-07-15 VITALS — BP 140/61 | HR 75 | Temp 99.3°F | Resp 16

## 2020-07-15 DIAGNOSIS — I110 Hypertensive heart disease with heart failure: Secondary | ICD-10-CM | POA: Diagnosis not present

## 2020-07-15 DIAGNOSIS — C189 Malignant neoplasm of colon, unspecified: Secondary | ICD-10-CM | POA: Diagnosis not present

## 2020-07-15 DIAGNOSIS — E785 Hyperlipidemia, unspecified: Secondary | ICD-10-CM | POA: Diagnosis not present

## 2020-07-15 DIAGNOSIS — Z87891 Personal history of nicotine dependence: Secondary | ICD-10-CM | POA: Diagnosis not present

## 2020-07-15 DIAGNOSIS — C180 Malignant neoplasm of cecum: Secondary | ICD-10-CM | POA: Insufficient documentation

## 2020-07-15 DIAGNOSIS — D509 Iron deficiency anemia, unspecified: Secondary | ICD-10-CM

## 2020-07-15 DIAGNOSIS — J90 Pleural effusion, not elsewhere classified: Secondary | ICD-10-CM | POA: Insufficient documentation

## 2020-07-15 DIAGNOSIS — C182 Malignant neoplasm of ascending colon: Secondary | ICD-10-CM

## 2020-07-15 DIAGNOSIS — Z800 Family history of malignant neoplasm of digestive organs: Secondary | ICD-10-CM | POA: Diagnosis not present

## 2020-07-15 DIAGNOSIS — R5383 Other fatigue: Secondary | ICD-10-CM | POA: Diagnosis not present

## 2020-07-15 DIAGNOSIS — D500 Iron deficiency anemia secondary to blood loss (chronic): Secondary | ICD-10-CM | POA: Diagnosis not present

## 2020-07-15 DIAGNOSIS — M8580 Other specified disorders of bone density and structure, unspecified site: Secondary | ICD-10-CM | POA: Insufficient documentation

## 2020-07-15 DIAGNOSIS — Z7901 Long term (current) use of anticoagulants: Secondary | ICD-10-CM | POA: Diagnosis not present

## 2020-07-15 DIAGNOSIS — I4821 Permanent atrial fibrillation: Secondary | ICD-10-CM | POA: Diagnosis not present

## 2020-07-15 LAB — CBC WITH DIFFERENTIAL/PLATELET
Abs Immature Granulocytes: 0.04 10*3/uL (ref 0.00–0.07)
Basophils Absolute: 0.1 10*3/uL (ref 0.0–0.1)
Basophils Relative: 2 %
Eosinophils Absolute: 0.9 10*3/uL — ABNORMAL HIGH (ref 0.0–0.5)
Eosinophils Relative: 10 %
HCT: 30 % — ABNORMAL LOW (ref 36.0–46.0)
Hemoglobin: 9.1 g/dL — ABNORMAL LOW (ref 12.0–15.0)
Immature Granulocytes: 0 %
Lymphocytes Relative: 19 %
Lymphs Abs: 1.8 10*3/uL (ref 0.7–4.0)
MCH: 27.7 pg (ref 26.0–34.0)
MCHC: 30.3 g/dL (ref 30.0–36.0)
MCV: 91.5 fL (ref 80.0–100.0)
Monocytes Absolute: 1.3 10*3/uL — ABNORMAL HIGH (ref 0.1–1.0)
Monocytes Relative: 13 %
Neutro Abs: 5.4 10*3/uL (ref 1.7–7.7)
Neutrophils Relative %: 56 %
Platelets: 413 10*3/uL — ABNORMAL HIGH (ref 150–400)
RBC: 3.28 MIL/uL — ABNORMAL LOW (ref 3.87–5.11)
RDW: 17.4 % — ABNORMAL HIGH (ref 11.5–15.5)
WBC: 9.6 10*3/uL (ref 4.0–10.5)
nRBC: 0.2 % (ref 0.0–0.2)

## 2020-07-15 LAB — IRON AND TIBC
Iron: 100 ug/dL (ref 28–170)
Saturation Ratios: 27 % (ref 10.4–31.8)
TIBC: 365 ug/dL (ref 250–450)
UIBC: 265 ug/dL

## 2020-07-15 LAB — FERRITIN: Ferritin: 38 ng/mL (ref 11–307)

## 2020-07-19 ENCOUNTER — Encounter: Payer: Self-pay | Admitting: Oncology

## 2020-07-19 NOTE — Progress Notes (Signed)
Hematology/Oncology Consult note [facility]  Telephone:([phone] [phone]  Patient Care Team: [name], MD as PCP - General (Family Medicine) Clent Jacks, RN as Oncology Nurse Navigator   Name of the patient: [name] [name]  [id number]  1929-06-27   Date of visit: 07/19/20  Diagnosis-ileocecal valve adenocarcinoma surgery pending  Chief complaint/ Reason for visit-discuss CT chest results and further management  Heme/Onc history: Patient is a 87 year old female with a past medical history significant for hypertension, osteopenia, hyperlipidemia, atrial fibrillation on Xarelto referred for iron deficiency anemia patient's most recent CBC showed white count of 8.8, H&H of 9.4/31.6 with an MCV of 86 and a platelet count of 308.  Iron studies on 03/25/2020 showed high a normal TIBC of 403 and iron saturation of 6%.  Ferritin levels were low at 17.  B12 levels back in December 2021 were normal at 1021.   She also underwent EGD and colonoscopy with Dr. Marius Ditch.  EGD was unremarkable but colonoscopy showed a partially obstructing mass at the ileocecal valve with oozing present.  This was biopsied and consistent with adenocarcinoma.  She had other polyps noted in the colon and rectum as well which were biopsied and was negative for dysplasia or malignancy.  Patient received 5 doses of Venofer for for iron deficiency anemia up until 05/09/2020.  Interval history-patient is here with her son today and is doing well overall.  She is scheduled for surgery on 08/04/2020 with Dr. Dema Severin  ECOG PS- 1 Pain scale- 0   Review of systems- Review of Systems  Constitutional:  Positive for malaise/fatigue. Negative for chills, fever and weight loss.  HENT:  Negative for congestion, ear discharge and nosebleeds.   Eyes:  Negative for blurred vision.  Respiratory:  Negative for cough, hemoptysis, sputum production, shortness of breath and wheezing.    Cardiovascular:  Negative for chest pain, palpitations, orthopnea and claudication.  Gastrointestinal:  Negative for abdominal pain, blood in stool, constipation, diarrhea, heartburn, melena, nausea and vomiting.  Genitourinary:  Negative for dysuria, flank pain, frequency, hematuria and urgency.  Musculoskeletal:  Negative for back pain, joint pain and myalgias.  Skin:  Negative for rash.  Neurological:  Negative for dizziness, tingling, focal weakness, seizures, weakness and headaches.  Endo/Heme/Allergies:  Does not bruise/bleed easily.  Psychiatric/Behavioral:  Negative for depression and suicidal ideas. The patient does not have insomnia.      Allergies  Allergen Reactions   Metoprolol Swelling    Feet, ankles and legs    Bactrim [Sulfamethoxazole-Trimethoprim] Other (See Comments)    Hearing loss   Ciprofloxacin Other (See Comments)    Flu-like symptoms   Pneumococcal Vaccines Other (See Comments)    Flu-like symptoms     Past Medical History:  Diagnosis Date   Anxiety    Cancer ([place])    Squamous Cell Carcinoma (top of head)   Constipation, chronic    Deaf, left    Diverticular disease    Hearing aid worn    Hearing deficit    History of Bell's palsy    left side - resolved - 50 yrs ago   Hypertension    Internal hemorrhoids    Osteopenia    Pure hypercholesterolemia      Past Surgical History:  Procedure Laterality Date   CATARACT EXTRACTION W/PHACO Left 11/16/2015   Procedure: CATARACT EXTRACTION PHACO AND INTRAOCULAR LENS PLACEMENT ([place]);  Surgeon: Leandrew Koyanagi, MD;  Location: [facility];  Service: Ophthalmology;  Laterality: Left;  TORIC   CATARACT EXTRACTION W/PHACO Right 12/07/2015   Procedure: CATARACT EXTRACTION PHACO AND INTRAOCULAR LENS PLACEMENT (I[facility]);  Surgeon: Leandrew Koyanagi, MD;  Location: [place];  Service: Ophthalmology;  Laterality: Right;  TORIC   COLONOSCOPY WITH PROPOFOL N/A 08/10/2014   Procedure: COLONOSCOPY  WITH PROPOFOL;  Surgeon: Lollie Sails, MD;  Location: [facility] ENDOSCOPY;  Service: Endoscopy;  Laterality: N/A;   COLONOSCOPY WITH PROPOFOL N/A 05/12/2020   Procedure: COLONOSCOPY WITH PROPOFOL;  Surgeon: Lin Landsman, MD;  Location: [facility] ENDOSCOPY;  Service: Gastroenterology;  Laterality: N/A;   ESOPHAGOGASTRODUODENOSCOPY (EGD) WITH PROPOFOL N/A 03/28/2020   Procedure: ESOPHAGOGASTRODUODENOSCOPY (EGD) WITH PROPOFOL;  Surgeon: Lucilla Lame, MD;  Location: [facility] ENDOSCOPY;  Service: Endoscopy;  Laterality: N/A;   EYE SURGERY     FEMUR FRACTURE SURGERY     FRACTURE SURGERY     GIVENS CAPSULE STUDY N/A 04/19/2020   Procedure: GIVENS CAPSULE STUDY;  Surgeon: Lin Landsman, MD;  Location: [facility] ENDOSCOPY;  Service: Gastroenterology;  Laterality: N/A;   Nasal Polyp Removal      Social History   Socioeconomic History   Marital status: Widowed    Spouse name: Not on file   Number of children: Not on file   Years of education: Not on file   Highest education level: Not on file  Occupational History   Not on file  Tobacco Use   Smoking status: Former    Pack years: 0.00    Types: Cigarettes    Quit date: 10/30/1985    Years since quitting: 34.7   Smokeless tobacco: Never  Vaping Use   Vaping Use: Never used  Substance and Sexual Activity   Alcohol use: No   Drug use: No   Sexual activity: Not on file  Other Topics Concern   Not on file  Social History Narrative   Not on file   Social Determinants of Health   Financial Resource Strain: Not on file  Food Insecurity: Not on file  Transportation Needs: Not on file  Physical Activity: Not on file  Stress: Not on file  Social Connections: Not on file  Intimate Partner Violence: Not on file    Family History  Problem Relation Age of Onset   Colon cancer Mother      Current Outpatient Medications:    acetaminophen (TYLENOL) 500 MG tablet, Take 1 tablet (500 mg total) by mouth every 6 (six) hours as needed., Disp: 30  tablet, Rfl: 0   Cholecalciferol 25 MCG (1000 UT) tablet, Take 1,000 Units by mouth., Disp: , Rfl:    furosemide (LASIX) 40 MG tablet, Take 1 tablet (40 mg total) by mouth daily., Disp: 30 tablet, Rfl: 1   iron polysaccharides (NIFEREX) 150 MG capsule, Take 1 capsule (150 mg total) by mouth daily. (Patient taking differently: Take 150 mg by mouth 2 (two) times daily.), Disp: 30 capsule, Rfl: 0   lovastatin (MEVACOR) 40 MG tablet, Take 40 mg by mouth at bedtime., Disp: , Rfl:    polyethylene glycol powder (GLYCOLAX/MIRALAX) 17 GM/SCOOP powder, Take 0.5 Containers by mouth daily as needed., Disp: , Rfl:    verapamil (CALAN-SR) 180 MG CR tablet, Take 180 mg by mouth daily., Disp: , Rfl:    amiodarone (PACERONE) 200 MG tablet, Take 1 tablet (200 mg total) by mouth daily. (Patient not taking: Reported on 07/15/2020), Disp: 30 tablet, Rfl: 1   digoxin (LANOXIN) 0.125 MG tablet, Take 62.5 mcg by mouth daily. (Patient not taking: Reported on 07/15/2020), Disp: ,  Rfl:    pantoprazole (PROTONIX) 40 MG tablet, Take 1 tablet (40 mg total) by mouth 2 (two) times daily before a meal. (Patient not taking: Reported on 07/15/2020), Disp: 60 tablet, Rfl: 2  Physical exam:  Vitals:   07/15/20 1512  BP: 140/61  Pulse: 75  Resp: 16  Temp: 99.3 F (37.4 C)  TempSrc: Tympanic   Physical Exam Constitutional:      General: She is not in acute distress. Cardiovascular:     Rate and Rhythm: Normal rate and regular rhythm.     Heart sounds: Normal heart sounds.  Pulmonary:     Effort: Pulmonary effort is normal.     Breath sounds: Normal breath sounds.  Abdominal:     General: Bowel sounds are normal.     Palpations: Abdomen is soft.  Skin:    General: Skin is warm and dry.  Neurological:     Mental Status: She is alert and oriented to person, place, and time.     CMP Latest Ref Rng & Units 05/24/2020  Glucose 70 - 99 mg/dL 115(H)  BUN 8 - 23 mg/dL 12  Creatinine 0.44 - 1.00 mg/dL 0.66  Sodium 135 - 145  mmol/L 141  Potassium 3.5 - 5.1 mmol/L 3.0(L)  Chloride 98 - 111 mmol/L 103  CO2 22 - 32 mmol/L 27  Calcium 8.9 - 10.3 mg/dL 8.9  Total Protein 6.5 - 8.1 g/dL 7.3  Total Bilirubin 0.3 - 1.2 mg/dL 0.6  Alkaline Phos 38 - 126 U/L 72  AST 15 - 41 U/L 24  ALT 0 - 44 U/L 17   CBC Latest Ref Rng & Units 07/15/2020  WBC 4.0 - 10.5 K/uL 9.6  Hemoglobin 12.0 - 15.0 g/dL 9.1(L)  Hematocrit 36.0 - 46.0 % 30.0(L)  Platelets 150 - 400 K/uL 413(H)     Assessment and plan- Patient is a 87 y.o. female with with adenocarcinoma of the ileocecal valve and resulting iron deficiency anemia here to discuss CT abdomen  results and further management  CT abdomen pelvis was reviewed by me independently.  I discussed the findings with the patient and her son.  CT does not show any evidence of metastatic disease.  There is asymmetric wall thickening in the proximal ascending colon suspicious for primary neoplasm.  Multiple hepatic cysts noted. She also has bilateral pleural effusions which were noted back in February 2022 as well this was lymphocyte predominant and was tapped as well and was negative for malignancy.  Flow cytometry can be considered in the future if effusions recur.  Patient presently is asymptomatic from these effusions and I will plan to repeat a CT chest 2 to 3 months after surgery.   I will tentatively see her in the third week of July after she is done with her surgery to discuss final pathology results and further management   Visit Diagnosis 1. Colon adenocarcinoma ([place])      Dr. Randa Evens, MD, MPH [facility] [phone] 07/19/2020 8:34 AM

## 2020-07-22 ENCOUNTER — Other Ambulatory Visit (HOSPITAL_COMMUNITY): Payer: Self-pay

## 2020-07-25 ENCOUNTER — Ambulatory Visit: Payer: Self-pay | Admitting: Surgery

## 2020-07-25 NOTE — H&P (Signed)
CC: Referred for newly diagnosed colon cancer  HPI: [name] [name] is a very pleasant 34yoF with hx of HTN, HLD, afib (on Xarelto), hearing loss and developed iron deficiency anemia.  She was seen at medical oncology.  She is ultimately referred to gastroenterology.  She had a colonoscopy with [name] [name] 05/12/20 demonstrated a mass at the ileocecal valve.  This was circumferential.  Biopsies were taken.  Additional sessile polyp in the colon.  Pathology on this mass returned as adenocarcinoma.  The rectal polyp was removed demonstrated sessile serrated polyp.  She was referred to see [name] in follow-up.  She is also seeing medical oncology, [name] [name].  She had a CT of the chest completed back in February 2022 showed no evident metastatic disease in the chest - one small 56mm LUL nodule that needs a 12 mo follow-up.  She has not had a CT abd/pelvis yet.  PMH: HTN, HLD, afib (on Xarelto), hearing loss  PSH: She denies prior abdominal surgical history  FHx: Denies FHx of colorectal, breast, endometrial, ovarian or cervical cancer  Social: Denies use of tobacco/EtOH/drugs. She is retired.  She is here today with her son who currently lives with her.  She gets around with a walker following a hip fracture couple years ago.  He reports no issues with memory.  She generally has a good appetite.  ROS: A comprehensive 10 system review of systems was completed with the patient and pertinent findings as noted above.  The patient is a 87 year old female.   Past Surgical History [name] Lorenzo, LPN; 1/88/4166 0:63 PM) Cataract Surgery   Bilateral. Colon Polyp Removal - Colonoscopy   Oral Surgery    Diagnostic Studies History [name] Lorenzo, LPN; 0/16/0109 3:23 PM) Colonoscopy   within last year  Allergies [name] Lorenzo, LPN; 5/57/3220 2:54 PM) Bactrim *ANTI-INFECTIVE AGENTS - MISC.*   Cipro *FLUOROQUINOLONES*   Allergies Reconciled    Medication History [name] Lorenzo, LPN; 2/70/6237 6:28 PM) Amiodarone  HCl  (200MG  Tablet, Oral) Active. Digoxin  (125MCG Tablet, Oral) Active. Furosemide  (40MG  Tablet, Oral) Active. Lovastatin  (40MG  Tablet, Oral) Active. Metoprolol Tartrate  (50MG  Tablet, Oral) Active. Pantoprazole Sodium  (40MG  Tablet DR, Oral) Active. Potassium Chloride Crys ER  (20MEQ Tablet ER, Oral) Active. Xarelto  (15MG  Tablet, Oral) Active. Triamcinolone Acetonide  (0.1% Cream, External) Active. Verapamil HCl ER  (180MG  Tablet ER, Oral) Active. Acetaminophen  (500MG  Tablet, Oral) Active. B Complex  (Oral) Active. Calcium Carbonate  (1250 (500 Ca)MG Capsule, Oral) Active. Cholecalciferol  (25 MCG(1000 UT) Capsule, Oral) Active. Niferex-150  (150MG  Capsule, Oral) Active. Medications Reconciled   Family History [name] Lorenzo, LPN; 04/13/1759 6:07 PM) Alcohol Abuse   Son. Colon Cancer   Mother. Hypertension   Mother.  Pregnancy / Birth History [name] Lorenzo, LPN; 3/71/0626 9:48 PM) Age at menarche   67 years. Age of menopause   37-50 Gravida   3 Maternal age   63-30 Para   2  Other Problems [name] Lorenzo, LPN; 5/46/2703 5:00 PM) Atrial Fibrillation   Colon Cancer   Diverticulosis   Hemorrhoids   High blood pressure   Hypercholesterolemia   Melanoma      Review of Systems [name] Gave M. Destani Wamser MD; 06/13/2020 2:59 PM) General Not Present- Appetite Loss, Chills, Fatigue, Fever, Night Sweats, Weight Gain and Weight Loss. Skin Present- Non-Healing Wounds. Not Present- Change in Wart/Mole, Dryness, Hives, Jaundice, New Lesions, Rash and Ulcer. HEENT Present- Hearing Loss, Seasonal Allergies and Wears glasses/contact lenses. Not Present- Earache, Hoarseness,  Nose Bleed, Oral Ulcers, Ringing in the Ears, Sinus Pain, Sore Throat, Visual Disturbances and Yellow Eyes. Respiratory Present- Wheezing. Not Present- Bloody sputum, Chronic Cough, Difficulty Breathing and Snoring. Cardiovascular Present- Swelling of Extremities. Not Present- Chest Pain, Difficulty Breathing Lying  Down, Leg Cramps, Palpitations, Rapid Heart Rate and Shortness of Breath. Gastrointestinal Present- Constipation and Hemorrhoids. Not Present- Abdominal Pain, Bloating, Bloody Stool, Change in Bowel Habits, Chronic diarrhea, Difficulty Swallowing, Excessive gas, Gets full quickly at meals, Indigestion, Nausea, Rectal Pain and Vomiting. Female Genitourinary Present- Nocturia. Not Present- Frequency, Painful Urination, Pelvic Pain and Urgency. Psychiatric Not Present- Anxiety and Depression. Hematology Present- Blood Thinners and Easy Bruising. Not Present- Excessive bleeding, Gland problems, HIV and Persistent Infections.  Vitals [name] Billings Dockery LPN; 0/04/5995 7:41 PM) 06/13/2020 2:36 PM Weight: 115.4 lb   Height: 63 in  Body Surface Area: 1.53 m   Body Mass Index: 20.44 kg/m   Pulse: 84 (Regular)    BP: 124/72(Sitting, Right Arm, Standard)       Physical Exam [name] Gave M. Sheniqua Carolan MD; 06/13/2020 2:59 PM) The physical exam findings are as follows: Note:  Constitutional: No acute distress; conversant; no deformities Eyes: Moist conjunctiva; no lid lag; anicteric sclerae; pupils equal and round Neck: Trachea midline; no palpable thyromegaly Lungs: Normal respiratory effort; no tactile fremitus CV: irreg rate/rhythm; no palpable thrill; no pitting edema GI: Abdomen soft, nontender, nondistended; no palpable hepatosplenomegaly MSK: Normal gait; no clubbing/cyanosis Psychiatric: Appropriate affect; alert and oriented 3 Lymphatic: No palpable cervical or axillary lymphadenopathy    Assessment & Plan  COLON CANCER  Ms. [name] is a very pleasant 34yoF with hx HTN, HLD, afib (on Xarelto), hearing loss here for newly diagnosed invasive adenocarcinoma arising in the cecum at the ileocecal valve.  CT chest 03/28/20-1 5 mm nodule but no definitive evidence of metastatic disease. CT abd/pelvis 06/18/20 - asymmetric thickening of prox ascending colon with a single enlarged regional mesenteric  lymph node. No evidence of metastatic disease.   -Cardiac clearance -The anatomy and physiology of the GI tract was reviewed with her and her son today. The pathophysiology of colon cancer was discussed at length with associated pictures. -We had discussed laparoscopic right hemicolectomy pending completion of the CT abdomen/pelvis. We discussed that if there is evident metastatic disease, potentially not proceeding with upfront surgery. -The planned procedure, material risks (including, but not limited to, pain, bleeding, infection, scarring, need for blood transfusion, damage to surrounding structures- blood vessels/nerves/viscus/organs, damage to ureter, urine leak, leak from anastomosis, need for additional procedures, worsening of pre-existing medical conditions, need for stoma which may be permanent, hernia, recurrence, DVT/PE, pneumonia, heart attack, stroke, death) benefits and alternatives to surgery were discussed at length. The patient's and her son's questions were answered to their satisfaction, they voiced understanding and elected to proceed with surgery. Additionally, we discussed typical postoperative expectations and the recovery process. We specifically spent time discussing the fact that with her age and needing a walker for assistance at baseline, the potential for discharge to skilled nursing facility as well based on her postoperative recovery.   Signed by [name], MD

## 2020-07-27 NOTE — Progress Notes (Signed)
DUE TO COVID-19 ONLY ONE VISITOR IS ALLOWED TO COME WITH YOU AND STAY IN THE WAITING ROOM ONLY DURING PRE OP AND PROCEDURE DAY OF SURGERY. THE 1 VISITOR  MAY VISIT WITH YOU AFTER SURGERY IN YOUR PRIVATE ROOM DURING VISITING HOURS ONLY!  YOU NEED TO HAVE A COVID 19 TEST ON___7/05/2020 ____ @_______ , THIS TEST MUST BE DONE BEFORE SURGERY,  COVID TESTING SITE [address] [place] [address], IT IS ON THE RIGHT GOING OUT [address] APPROXIMATELY  2 MINUTES PAST [employer] ON THE RIGHT. ONCE YOUR COVID TEST IS COMPLETED,  PLEASE BEGIN THE QUARANTINE INSTRUCTIONS AS OUTLINED IN YOUR HANDOUT.                [name] [name]  07/27/2020   Your procedure is scheduled on:  08/04/2020   Report to [facility] Main  Entrance   Report to admitting at    1000AM     Call this number if you have problems the morning of surgery [phone]           REMEMBER: FOLLOW ALL YOUR BOWEL PREP INSTRUCTIONS WITH CLEAR LIQUIDS FROM YOUR SURGEON'S INSTRUCTIONS. DRINK 2 PRESURGERY ENSURE DRINKS THE NIGHT BEFORE SURGERY AT 1000 PM AND 1 PRESURGERY DRINK THE DAY OF THE PROCEDURE 3 HOURS PRIOR TO SCHEDULED SURGERY.  NOTHING BY MOUTH EXCEPT CLEAR LIQUIDS UNTIL THREE HOURS PRIOR TO SCHEDULED SURGERY. PLEASE FINISH PRESURGERY 3RD  ENSURE  DRINK PER SURGEON ORDER 3 HOURS PRIOR TO SCHEDULED SURGERY TIME WHICH NEEDS TO BE COMPLETED AT _____0900am____.     CLEAR LIQUID DIET   Foods Allowed                                                                      Coffee and tea, regular and decaf                           Plain Jell-O any favor except red or purple                                         Fruit ices (not with fruit pulp)                                      Iced Popsicles                                     Carbonated beverages, regular and diet                                    Cranberry, grape and apple juices Sports drinks like Gatorade Lightly seasoned clear broth or  consume(fat free) Sugar, honey syrup  _____________________________________________________________________      BRUSH YOUR TEETH MORNING OF SURGERY AND RINSE YOUR MOUTH OUT, NO CHEWING GUM CANDY OR MINTS.     Take these medicines the  morning of surgery with A SIP OF WATER:   Verapamil  DO NOT TAKE ANY DIABETIC MEDICATIONS DAY OF YOUR SURGERY                               You may not have any metal on your body including hair pins and              piercings  Do not wear jewelry, make-up, lotions, powders or perfumes, deodorant             Do not wear nail polish on your fingernails.  Do not shave  48 hours prior to surgery.              Men may shave face and neck.   Do not bring valuables to the hospital. [place].  Contacts, dentures or bridgework may not be worn into surgery.  Leave suitcase in the car. After surgery it may be brought to your room.                 Please read over the following fact sheets you were given: _____________________________________________________________________  [facility] - Preparing for Surgery Before surgery, you can play an important role.  Because skin is not sterile, your skin needs to be as free of germs as possible.  You can reduce the number of germs on your skin by washing with CHG (chlorahexidine gluconate) soap before surgery.  CHG is an antiseptic cleaner which kills germs and bonds with the skin to continue killing germs even after washing. Please DO NOT use if you have an allergy to CHG or antibacterial soaps.  If your skin becomes reddened/irritated stop using the CHG and inform your nurse when you arrive at Short Stay. Do not shave (including legs and underarms) for at least 48 hours prior to the first CHG shower.  You may shave your face/neck. Please follow these instructions carefully:  1.  Shower with CHG Soap the night before surgery and the  morning of Surgery.  2.  If you  choose to wash your hair, wash your hair first as usual with your  normal  shampoo.  3.  After you shampoo, rinse your hair and body thoroughly to remove the  shampoo.                           4.  Use CHG as you would any other liquid soap.  You can apply chg directly  to the skin and wash                       Gently with a scrungie or clean washcloth.  5.  Apply the CHG Soap to your body ONLY FROM THE NECK DOWN.   Do not use on face/ open                           Wound or open sores. Avoid contact with eyes, ears mouth and genitals (private parts).                       Wash face,  Genitals (private parts) with your normal soap.  6.  Wash thoroughly, paying special attention to the area where your surgery  will be performed.  7.  Thoroughly rinse your body with warm water from the neck down.  8.  DO NOT shower/wash with your normal soap after using and rinsing off  the CHG Soap.                9.  Pat yourself dry with a clean towel.            10.  Wear clean pajamas.            11.  Place clean sheets on your bed the night of your first shower and do not  sleep with pets. Day of Surgery : Do not apply any lotions/deodorants the morning of surgery.  Please wear clean clothes to the hospital/surgery center.  FAILURE TO FOLLOW THESE INSTRUCTIONS MAY RESULT IN THE CANCELLATION OF YOUR SURGERY PATIENT SIGNATURE_________________________________  NURSE SIGNATURE__________________________________  ________________________________________________________________________

## 2020-07-28 ENCOUNTER — Other Ambulatory Visit: Payer: Self-pay

## 2020-07-28 ENCOUNTER — Encounter (HOSPITAL_COMMUNITY): Payer: Self-pay

## 2020-07-28 ENCOUNTER — Encounter (HOSPITAL_COMMUNITY)
Admission: RE | Admit: 2020-07-28 | Discharge: 2020-07-28 | Disposition: A | Discharge status: Home / Self Care | Payer: Medicare Other | Source: Ambulatory Visit | Attending: Surgery | Admitting: Surgery

## 2020-07-28 DIAGNOSIS — Z01812 Encounter for preprocedural laboratory examination: Secondary | ICD-10-CM | POA: Insufficient documentation

## 2020-07-28 HISTORY — DX: Anemia, unspecified: D64.9

## 2020-07-28 HISTORY — DX: Nonrheumatic mitral (valve) insufficiency: I34.0

## 2020-07-28 HISTORY — DX: Iron deficiency anemia, unspecified: D50.9

## 2020-07-28 HISTORY — DX: Gastro-esophageal reflux disease without esophagitis: K21.9

## 2020-07-28 LAB — CBC WITH DIFFERENTIAL/PLATELET
Abs Immature Granulocytes: 0.03 10*3/uL (ref 0.00–0.07)
Basophils Absolute: 0.1 10*3/uL (ref 0.0–0.1)
Basophils Relative: 1 %
Eosinophils Absolute: 0.5 10*3/uL (ref 0.0–0.5)
Eosinophils Relative: 6 %
HCT: 33.1 % — ABNORMAL LOW (ref 36.0–46.0)
Hemoglobin: 9.8 g/dL — ABNORMAL LOW (ref 12.0–15.0)
Immature Granulocytes: 0 %
Lymphocytes Relative: 15 %
Lymphs Abs: 1.4 10*3/uL (ref 0.7–4.0)
MCH: 28.7 pg (ref 26.0–34.0)
MCHC: 29.6 g/dL — ABNORMAL LOW (ref 30.0–36.0)
MCV: 96.8 fL (ref 80.0–100.0)
Monocytes Absolute: 1.4 10*3/uL — ABNORMAL HIGH (ref 0.1–1.0)
Monocytes Relative: 15 %
Neutro Abs: 6 10*3/uL (ref 1.7–7.7)
Neutrophils Relative %: 63 %
Platelets: 352 10*3/uL (ref 150–400)
RBC: 3.42 MIL/uL — ABNORMAL LOW (ref 3.87–5.11)
RDW: 18.2 % — ABNORMAL HIGH (ref 11.5–15.5)
WBC: 9.4 10*3/uL (ref 4.0–10.5)
nRBC: 0 % (ref 0.0–0.2)

## 2020-07-28 LAB — COMPREHENSIVE METABOLIC PANEL
ALT: 16 U/L (ref 0–44)
AST: 21 U/L (ref 15–41)
Albumin: 3.5 g/dL (ref 3.5–5.0)
Alkaline Phosphatase: 71 U/L (ref 38–126)
Anion gap: 8 (ref 5–15)
BUN: 16 mg/dL (ref 8–23)
CO2: 31 mmol/L (ref 22–32)
Calcium: 9.3 mg/dL (ref 8.9–10.3)
Chloride: 103 mmol/L (ref 98–111)
Creatinine, Ser: 0.63 mg/dL (ref 0.44–1.00)
GFR, Estimated: >60 mL/min (ref >60)
Glucose, Bld: 97 mg/dL (ref 70–99)
Potassium: 3.6 mmol/L (ref 3.5–5.1)
Sodium: 142 mmol/L (ref 135–145)
Total Bilirubin: 0.3 mg/dL (ref 0.3–1.2)
Total Protein: 7.3 g/dL (ref 6.5–8.1)

## 2020-07-28 LAB — PROTIME-INR
INR: 1 (ref 0.8–1.2)
Prothrombin Time: 12.9 seconds (ref 11.4–15.2)

## 2020-07-28 LAB — SURGICAL PCR SCREEN
MRSA, PCR: NEGATIVE
Staphylococcus aureus: NEGATIVE

## 2020-07-28 LAB — APTT: aPTT: 30 seconds (ref 24–36)

## 2020-07-28 NOTE — Progress Notes (Addendum)
Anesthesia Review:  PCP: DR  [name] LOV 06/15/20  Cardiologist : DR Nehemiah Massed 02/04/20 LOV  Chest x-ray : 1v 03/31/20  EKG : 03/30/20  Echo : 03/27/20  Stress test: Cardiac Cath :  Activity level:  no uses walker  Sleep Study/ CPAP : none  Fasting Blood Sugar :      / Checks Blood Sugar -- times a day:   Blood Thinner/ Instructions /Last Dose: ASA / Instructions/ Last Dose :   Very HOH  Cbc done 07/28/20 routed to DR Harrell Gave white  Son reported that was unsure if had bowel prep instructions from [facility].  Called [facility] abnd spoke with Abigail Butts and had bowel prep instructions faxed to [place].  Copy given to pt's son, Percell Miller at preop.   Hgba1c-07/28/20-5.2

## 2020-07-30 LAB — HEMOGLOBIN A1C
Hgb A1c MFr Bld: 5.2 % (ref 4.8–5.6)
Mean Plasma Glucose: 103 mg/dL

## 2020-08-02 ENCOUNTER — Other Ambulatory Visit (HOSPITAL_COMMUNITY)
Admission: RE | Admit: 2020-08-02 | Discharge: 2020-08-02 | Disposition: A | Discharge status: Home / Self Care | Payer: Medicare Other | Source: Ambulatory Visit | Attending: Surgery | Admitting: Surgery

## 2020-08-02 ENCOUNTER — Encounter (HOSPITAL_COMMUNITY): Payer: Self-pay

## 2020-08-02 DIAGNOSIS — Z01812 Encounter for preprocedural laboratory examination: Secondary | ICD-10-CM | POA: Insufficient documentation

## 2020-08-02 DIAGNOSIS — Z20822 Contact with and (suspected) exposure to covid-19: Secondary | ICD-10-CM | POA: Insufficient documentation

## 2020-08-02 NOTE — Progress Notes (Signed)
Anesthesia Chart Review:   Case: [id number] Date/Time: 08/04/20 1145   Procedure: LAPAROSCOPIC RIGHT HEMI COLECTOMY (Right)   Anesthesia type: General   Pre-op diagnosis: COLON CANCER   Location: [facility] ROOM 02 / [facility]   Surgeons: [name], MD       DISCUSSION: Pt is 87 years old with hx of permanent atrial fibrillation, moderate to severe mitral valve regurgitation ( by 03/27/20 echo), HTN, iron deficiency anemia. Pt is hard of hearing.   Hospitalized 2/25-04/02/20 for fatigue, anemia (hgb 6.7), hypokalemia (K 2.7); complicated by acute hypoxic respiratory failure due to CHF caused by fluid volume overload from IVF and blood transfusion, bilateral pleural effusions (s/p B thoracentesis of 651mL from R and 586mL from L)   - Hgb 9.8 is consistent with prior results. Baseline hgb ~10  - Pt to hold xarelto 3 days before surgery  VS: BP (!) 126/99   Pulse 74   Temp 37.1 C (Oral)   Resp 16   Ht 5\' 3"  (1.6 m)   Wt 54.4 kg   SpO2 100%   BMI 21.26 kg/m   PROVIDERS: - PCP is [name], MD (notes in care everywhere) - Cardiologist is Serafina Royals, MD. Last office visit 02/04/20 (notes in care everywhere). He cleared pt for surgery noting "low risk for cardiovascular complications... recommend diligent hemodynamic and cardiopulmonary monitoring"   LABS: Labs reviewed: Acceptable for surgery. - Hgb 9.8 is consistent with prior results. Baseline hgb ~10  (all labs ordered are listed, but only abnormal results are displayed)  Labs Reviewed  CBC WITH DIFFERENTIAL/PLATELET - Abnormal; Notable for the following components:      Result Value   RBC 3.42 (*)    Hemoglobin 9.8 (*)    HCT 33.1 (*)    MCHC 29.6 (*)    RDW 18.2 (*)    Monocytes Absolute 1.4 (*)    All other components within normal limits  SURGICAL PCR SCREEN  HEMOGLOBIN A1C  COMPREHENSIVE METABOLIC PANEL  PROTIME-INR  APTT  TYPE AND SCREEN     IMAGES: 1 view CXR 03/31/20:  1. Mild left base  infiltrate noted today's exam. Small left pleural effusion again noted. 2.  Mild right base atelectasis.  CT chest 03/28/20:  1. Large bilateral pleural effusions with collapse/consolidation in both lower lobes, difficult to exclude pneumonia. 2. 5 mm peripheral left upper lobe nodule. No follow-up needed if patient is low-risk. Non-contrast chest CT can be considered in 12 months if patient is high-risk and aching in the consideration patient's age.  3. Hepatic steatosis and probable cirrhosis. 4. Aortic atherosclerosis (ICD10-I70.0). Coronary artery calcification. 5.  Emphysema   EKG 03/30/20:NSR   CV: Echo 03/27/20:  1. Left ventricular ejection fraction, by estimation, is 60 to 65%. The left ventricle has normal function. The left ventricle demonstrates regional wall motion abnormalities (see scoring diagram/findings for description). There is mild left ventricular hypertrophy. Left ventricular diastolic parameters are consistent with Grade II diastolic dysfunction (pseudonormalization).   2. Right ventricular systolic function is normal. The right ventricular size is normal.   3. Moderate pleural effusion in the left lateral region.   4. The mitral valve is myxomatous. Moderate to severe mitral valve regurgitation. Moderate mitral annular calcification.   5. The aortic valve is normal in structure. Aortic valve regurgitation is trivial.    Past Medical History:  Diagnosis Date   Anemia    Anxiety    Cancer ([place])    Squamous Cell Carcinoma (top of head)  Constipation, chronic    Deaf, left    Diverticular disease    GERD (gastroesophageal reflux disease)    Hearing aid worn    Hearing deficit    History of Bell's palsy    left side - resolved - 50 yrs ago   Hypertension    Internal hemorrhoids    Iron deficiency anemia    Mitral regurgitation    moderate to severe on 03/27/20 echo   Osteopenia    Pure hypercholesterolemia     Past Surgical History:  Procedure Laterality  Date   CATARACT EXTRACTION W/PHACO Left 11/16/2015   Procedure: CATARACT EXTRACTION PHACO AND INTRAOCULAR LENS PLACEMENT ([place]);  Surgeon: Leandrew Koyanagi, MD;  Location: [place];  Service: Ophthalmology;  Laterality: Left;  TORIC   CATARACT EXTRACTION W/PHACO Right 12/07/2015   Procedure: CATARACT EXTRACTION PHACO AND INTRAOCULAR LENS PLACEMENT ([facility]);  Surgeon: Leandrew Koyanagi, MD;  Location: [place];  Service: Ophthalmology;  Laterality: Right;  TORIC   COLONOSCOPY WITH PROPOFOL N/A 08/10/2014   Procedure: COLONOSCOPY WITH PROPOFOL;  Surgeon: Lollie Sails, MD;  Location: [facility] ENDOSCOPY;  Service: Endoscopy;  Laterality: N/A;   COLONOSCOPY WITH PROPOFOL N/A 05/12/2020   Procedure: COLONOSCOPY WITH PROPOFOL;  Surgeon: Lin Landsman, MD;  Location: [facility] ENDOSCOPY;  Service: Gastroenterology;  Laterality: N/A;   ESOPHAGOGASTRODUODENOSCOPY (EGD) WITH PROPOFOL N/A 03/28/2020   Procedure: ESOPHAGOGASTRODUODENOSCOPY (EGD) WITH PROPOFOL;  Surgeon: Lucilla Lame, MD;  Location: [facility] ENDOSCOPY;  Service: Endoscopy;  Laterality: N/A;   EYE SURGERY     FEMUR FRACTURE SURGERY     FRACTURE SURGERY     GIVENS CAPSULE STUDY N/A 04/19/2020   Procedure: GIVENS CAPSULE STUDY;  Surgeon: Lin Landsman, MD;  Location: [facility] ENDOSCOPY;  Service: Gastroenterology;  Laterality: N/A;   Nasal Polyp Removal      MEDICATIONS:  acetaminophen (TYLENOL) 500 MG tablet   amiodarone (PACERONE) 200 MG tablet   calcium carbonate (TUMS - DOSED IN MG ELEMENTAL CALCIUM) 500 MG chewable tablet   fluticasone (FLONASE) 50 MCG/ACT nasal spray   furosemide (LASIX) 40 MG tablet   iron polysaccharides (NIFEREX) 150 MG capsule   lovastatin (MEVACOR) 40 MG tablet   Multiple Vitamin (MULTIVITAMIN WITH MINERALS) TABS tablet   Multiple Vitamins-Minerals (HAIR/SKIN/NAILS) CAPS   pantoprazole (PROTONIX) 40 MG tablet   Polyethyl Glycol-Propyl Glycol (SYSTANE OP)   polyethylene glycol powder  (GLYCOLAX/MIRALAX) 17 GM/SCOOP powder   tretinoin (RETIN-A) 0.05 % cream   verapamil (CALAN-SR) 180 MG CR tablet   No current facility-administered medications for this encounter.   - Pt to hold xarelto 3 days before surgery   If no changes, I anticipate pt can proceed with surgery as scheduled.   Willeen Cass, PhD, FNP-BC [facility]/Anesthesiology Phone: [phone] 08/02/2020 12:46 PM

## 2020-08-02 NOTE — Anesthesia Preprocedure Evaluation (Addendum)
Anesthesia Evaluation  Patient identified by MRN, date of birth, ID band Patient awake    Reviewed: Allergy & Precautions, NPO status , Patient's Chart, lab work & pertinent test results  History of Anesthesia Complications Negative for: history of anesthetic complications  Airway Mallampati: II  TM Distance: >3 FB Neck ROM: Full    Dental no notable dental hx.    Pulmonary former smoker,    Pulmonary exam normal        Cardiovascular hypertension, Pt. on medications Normal cardiovascular exam+ dysrhythmias Atrial Fibrillation   TTE 03/27/20: EF 60-65%, RWMAs, mild LVH, grade 2 DD, moderate left pleural effusion, moderate to severe MR    Neuro/Psych Anxiety negative neurological ROS     GI/Hepatic Neg liver ROS, GERD  ,  Endo/Other  negative endocrine ROS  Renal/GU negative Renal ROS  negative genitourinary   Musculoskeletal  (+) Arthritis ,   Abdominal   Peds  Hematology  (+) anemia , Hgb 9.8   Anesthesia Other Findings Day of surgery medications reviewed with patient.  Reproductive/Obstetrics negative OB ROS                           Anesthesia Physical Anesthesia Plan  ASA: 3  Anesthesia Plan: General   Post-op Pain Management:    Induction: Intravenous  PONV Risk Score and Plan: Treatment may vary due to age or medical condition, Ondansetron and Dexamethasone  Airway Management Planned: Oral ETT  Additional Equipment:   Intra-op Plan:   Post-operative Plan: Extubation in OR  Informed Consent: I have reviewed the patients History and Physical, chart, labs and discussed the procedure including the risks, benefits and alternatives for the proposed anesthesia with the patient or authorized representative who has indicated his/her understanding and acceptance.     Dental advisory given  Plan Discussed with: CRNA  Anesthesia Plan Comments: (See APP note by Durel Salts, FNP )       Anesthesia Quick Evaluation

## 2020-08-03 LAB — SARS CORONAVIRUS 2 (TAT 6-24 HRS): SARS Coronavirus 2: NEGATIVE

## 2020-08-03 MED ORDER — BUPIVACAINE LIPOSOME 1.3 % IJ SUSP
20.0000 mL | Freq: Once | INTRAMUSCULAR | Status: DC
  Filled 2020-08-03: qty 20

## 2020-08-03 MED ORDER — SODIUM CHLORIDE 0.9 % IV SOLN
2.0000 g | INTRAVENOUS | Status: AC
  Administered 2020-08-04: 2 g via INTRAVENOUS
  Filled 2020-08-03: qty 2

## 2020-08-04 ENCOUNTER — Encounter (HOSPITAL_COMMUNITY): Payer: Self-pay | Admitting: Surgery

## 2020-08-04 ENCOUNTER — Encounter (HOSPITAL_COMMUNITY)
Admission: RE | Disposition: A | Discharge status: Skilled Nursing Facility | Payer: Self-pay | Source: Home / Self Care | Attending: Internal Medicine

## 2020-08-04 ENCOUNTER — Inpatient Hospital Stay (HOSPITAL_COMMUNITY)
Admission: RE | Admit: 2020-08-04 | Discharge: 2020-08-16 | DRG: 329 | Disposition: A | Discharge status: Skilled Nursing Facility | Payer: Medicare Other | Attending: Student | Admitting: Student

## 2020-08-04 ENCOUNTER — Inpatient Hospital Stay (HOSPITAL_COMMUNITY): Payer: Medicare Other | Admitting: Certified Registered"

## 2020-08-04 ENCOUNTER — Other Ambulatory Visit: Payer: Self-pay

## 2020-08-04 ENCOUNTER — Inpatient Hospital Stay (HOSPITAL_COMMUNITY): Payer: Medicare Other | Admitting: Emergency Medicine

## 2020-08-04 DIAGNOSIS — K5909 Other constipation: Secondary | ICD-10-CM | POA: Diagnosis present

## 2020-08-04 DIAGNOSIS — D500 Iron deficiency anemia secondary to blood loss (chronic): Secondary | ICD-10-CM | POA: Diagnosis not present

## 2020-08-04 DIAGNOSIS — D509 Iron deficiency anemia, unspecified: Secondary | ICD-10-CM | POA: Diagnosis not present

## 2020-08-04 DIAGNOSIS — I4821 Permanent atrial fibrillation: Secondary | ICD-10-CM | POA: Diagnosis present

## 2020-08-04 DIAGNOSIS — J810 Acute pulmonary edema: Secondary | ICD-10-CM

## 2020-08-04 DIAGNOSIS — R0602 Shortness of breath: Secondary | ICD-10-CM

## 2020-08-04 DIAGNOSIS — R410 Disorientation, unspecified: Secondary | ICD-10-CM

## 2020-08-04 DIAGNOSIS — C180 Malignant neoplasm of cecum: Principal | ICD-10-CM | POA: Diagnosis present

## 2020-08-04 DIAGNOSIS — Z20822 Contact with and (suspected) exposure to covid-19: Secondary | ICD-10-CM | POA: Diagnosis present

## 2020-08-04 DIAGNOSIS — L89321 Pressure ulcer of left buttock, stage 1: Secondary | ICD-10-CM | POA: Diagnosis not present

## 2020-08-04 DIAGNOSIS — L8990 Pressure ulcer of unspecified site, unspecified stage: Secondary | ICD-10-CM | POA: Insufficient documentation

## 2020-08-04 DIAGNOSIS — I5043 Acute on chronic combined systolic (congestive) and diastolic (congestive) heart failure: Secondary | ICD-10-CM | POA: Diagnosis not present

## 2020-08-04 DIAGNOSIS — I248 Other forms of acute ischemic heart disease: Secondary | ICD-10-CM | POA: Diagnosis not present

## 2020-08-04 DIAGNOSIS — R0603 Acute respiratory distress: Secondary | ICD-10-CM | POA: Diagnosis present

## 2020-08-04 DIAGNOSIS — I10 Essential (primary) hypertension: Secondary | ICD-10-CM | POA: Diagnosis not present

## 2020-08-04 DIAGNOSIS — Z888 Allergy status to other drugs, medicaments and biological substances status: Secondary | ICD-10-CM | POA: Diagnosis not present

## 2020-08-04 DIAGNOSIS — I5023 Acute on chronic systolic (congestive) heart failure: Secondary | ICD-10-CM | POA: Diagnosis not present

## 2020-08-04 DIAGNOSIS — E876 Hypokalemia: Secondary | ICD-10-CM | POA: Diagnosis not present

## 2020-08-04 DIAGNOSIS — Z800 Family history of malignant neoplasm of digestive organs: Secondary | ICD-10-CM | POA: Diagnosis not present

## 2020-08-04 DIAGNOSIS — G4700 Insomnia, unspecified: Secondary | ICD-10-CM | POA: Diagnosis present

## 2020-08-04 DIAGNOSIS — Z87891 Personal history of nicotine dependence: Secondary | ICD-10-CM | POA: Diagnosis not present

## 2020-08-04 DIAGNOSIS — Z7901 Long term (current) use of anticoagulants: Secondary | ICD-10-CM

## 2020-08-04 DIAGNOSIS — I340 Nonrheumatic mitral (valve) insufficiency: Secondary | ICD-10-CM | POA: Diagnosis not present

## 2020-08-04 DIAGNOSIS — K219 Gastro-esophageal reflux disease without esophagitis: Secondary | ICD-10-CM | POA: Diagnosis present

## 2020-08-04 DIAGNOSIS — L89152 Pressure ulcer of sacral region, stage 2: Secondary | ICD-10-CM | POA: Diagnosis not present

## 2020-08-04 DIAGNOSIS — I110 Hypertensive heart disease with heart failure: Secondary | ICD-10-CM | POA: Diagnosis not present

## 2020-08-04 DIAGNOSIS — C772 Secondary and unspecified malignant neoplasm of intra-abdominal lymph nodes: Secondary | ICD-10-CM | POA: Diagnosis not present

## 2020-08-04 DIAGNOSIS — I5031 Acute diastolic (congestive) heart failure: Secondary | ICD-10-CM | POA: Diagnosis not present

## 2020-08-04 DIAGNOSIS — C182 Malignant neoplasm of ascending colon: Secondary | ICD-10-CM | POA: Diagnosis not present

## 2020-08-04 DIAGNOSIS — Z79899 Other long term (current) drug therapy: Secondary | ICD-10-CM

## 2020-08-04 DIAGNOSIS — L89311 Pressure ulcer of right buttock, stage 1: Secondary | ICD-10-CM | POA: Diagnosis not present

## 2020-08-04 DIAGNOSIS — R0902 Hypoxemia: Secondary | ICD-10-CM | POA: Diagnosis not present

## 2020-08-04 DIAGNOSIS — H9190 Unspecified hearing loss, unspecified ear: Secondary | ICD-10-CM

## 2020-08-04 HISTORY — PX: LAPAROSCOPIC RIGHT HEMI COLECTOMY: SHX5926

## 2020-08-04 LAB — TYPE AND SCREEN
ABO/RH(D): A POS
Antibody Screen: NEGATIVE

## 2020-08-04 SURGERY — LAPAROSCOPIC RIGHT HEMI COLECTOMY
Anesthesia: General | Site: Abdomen | Laterality: Right

## 2020-08-04 MED ORDER — FENTANYL CITRATE (PF) 100 MCG/2ML IJ SOLN
25.0000 ug | INTRAMUSCULAR | Status: DC | PRN
  Administered 2020-08-04: 50 ug via INTRAVENOUS
  Administered 2020-08-04: 25 ug via INTRAVENOUS

## 2020-08-04 MED ORDER — PROMETHAZINE HCL 25 MG/ML IJ SOLN: 6.2500 mg | INTRAMUSCULAR | Status: DC | PRN

## 2020-08-04 MED ORDER — CHLORHEXIDINE GLUCONATE CLOTH 2 % EX PADS: 6.0000 | MEDICATED_PAD | Freq: Once | CUTANEOUS | Status: DC

## 2020-08-04 MED ORDER — HEPARIN SODIUM (PORCINE) 5000 UNIT/ML IJ SOLN
5000.0000 [IU] | Freq: Once | INTRAMUSCULAR | Status: AC
  Administered 2020-08-04: 5000 [IU] via SUBCUTANEOUS
  Filled 2020-08-04: qty 1

## 2020-08-04 MED ORDER — OXYCODONE HCL 5 MG/5ML PO SOLN
5.0000 mg | Freq: Once | ORAL | Status: AC | PRN
Start: 2020-08-04 — End: 2020-08-04

## 2020-08-04 MED ORDER — BUPIVACAINE LIPOSOME 1.3 % IJ SUSP
INTRAMUSCULAR | Status: DC | PRN
  Administered 2020-08-04: 20 mL

## 2020-08-04 MED ORDER — LACTATED RINGERS IV SOLN: INTRAVENOUS | Status: DC

## 2020-08-04 MED ORDER — ROCURONIUM BROMIDE 10 MG/ML (PF) SYRINGE
PREFILLED_SYRINGE | INTRAVENOUS | Status: AC
  Filled 2020-08-04: qty 10

## 2020-08-04 MED ORDER — BISACODYL 5 MG PO TBEC: 20.0000 mg | DELAYED_RELEASE_TABLET | Freq: Once | ORAL | Status: DC

## 2020-08-04 MED ORDER — HYDROMORPHONE HCL 1 MG/ML IJ SOLN
0.5000 mg | INTRAMUSCULAR | Status: DC | PRN
  Filled 2020-08-04: qty 0.5

## 2020-08-04 MED ORDER — GLYCOPYRROLATE 0.2 MG/ML IJ SOLN
INTRAMUSCULAR | Status: DC | PRN
  Administered 2020-08-04: .1 mg via INTRAVENOUS

## 2020-08-04 MED ORDER — ALVIMOPAN 12 MG PO CAPS
12.0000 mg | ORAL_CAPSULE | Freq: Two times a day (BID) | ORAL | Status: DC
  Administered 2020-08-05 – 2020-08-07 (×6): 12 mg via ORAL
  Filled 2020-08-04 (×7): qty 1

## 2020-08-04 MED ORDER — ALVIMOPAN 12 MG PO CAPS
12.0000 mg | ORAL_CAPSULE | ORAL | Status: AC
  Administered 2020-08-04: 12 mg via ORAL
  Filled 2020-08-04: qty 1

## 2020-08-04 MED ORDER — FENTANYL CITRATE (PF) 250 MCG/5ML IJ SOLN
INTRAMUSCULAR | Status: AC
  Filled 2020-08-04: qty 5

## 2020-08-04 MED ORDER — IBUPROFEN 400 MG PO TABS
600.0000 mg | ORAL_TABLET | Freq: Four times a day (QID) | ORAL | Status: DC | PRN
  Administered 2020-08-05 – 2020-08-06 (×2): 600 mg via ORAL
  Filled 2020-08-04 (×2): qty 1

## 2020-08-04 MED ORDER — CHLORHEXIDINE GLUCONATE 0.12 % MT SOLN
15.0000 mL | Freq: Once | OROMUCOSAL | Status: AC
  Administered 2020-08-04: 15 mL via OROMUCOSAL

## 2020-08-04 MED ORDER — EPHEDRINE SULFATE-NACL 50-0.9 MG/10ML-% IV SOSY
PREFILLED_SYRINGE | INTRAVENOUS | Status: DC | PRN
  Administered 2020-08-04: 10 mg via INTRAVENOUS
  Administered 2020-08-04 (×2): 5 mg via INTRAVENOUS

## 2020-08-04 MED ORDER — ENSURE SURGERY PO LIQD
237.0000 mL | Freq: Two times a day (BID) | ORAL | Status: DC
  Administered 2020-08-05 – 2020-08-16 (×11): 237 mL via ORAL
  Filled 2020-08-04 (×21): qty 237

## 2020-08-04 MED ORDER — PROPOFOL 10 MG/ML IV BOLUS
INTRAVENOUS | Status: DC | PRN
  Administered 2020-08-04: 30 mg via INTRAVENOUS
  Administered 2020-08-04: 10 mg via INTRAVENOUS
  Administered 2020-08-04: 80 mg via INTRAVENOUS

## 2020-08-04 MED ORDER — ONDANSETRON HCL 4 MG/2ML IJ SOLN
INTRAMUSCULAR | Status: DC | PRN
  Administered 2020-08-04: 4 mg via INTRAVENOUS

## 2020-08-04 MED ORDER — ENSURE PRE-SURGERY PO LIQD
592.0000 mL | Freq: Once | ORAL | Status: DC
  Filled 2020-08-04: qty 592

## 2020-08-04 MED ORDER — DEXAMETHASONE SODIUM PHOSPHATE 10 MG/ML IJ SOLN
INTRAMUSCULAR | Status: AC
  Filled 2020-08-04: qty 1

## 2020-08-04 MED ORDER — DIPHENHYDRAMINE HCL 12.5 MG/5ML PO ELIX
12.5000 mg | ORAL_SOLUTION | Freq: Four times a day (QID) | ORAL | Status: DC | PRN
  Administered 2020-08-09 – 2020-08-11 (×3): 12.5 mg via ORAL
  Filled 2020-08-04 (×3): qty 5

## 2020-08-04 MED ORDER — METRONIDAZOLE 500 MG PO TABS: 1000.0000 mg | ORAL_TABLET | ORAL | Status: DC

## 2020-08-04 MED ORDER — ACETAMINOPHEN 500 MG PO TABS
1000.0000 mg | ORAL_TABLET | ORAL | Status: AC
  Administered 2020-08-04: 1000 mg via ORAL
  Filled 2020-08-04: qty 2

## 2020-08-04 MED ORDER — BUPIVACAINE-EPINEPHRINE 0.25% -1:200000 IJ SOLN
INTRAMUSCULAR | Status: DC | PRN
  Administered 2020-08-04: 30 mL

## 2020-08-04 MED ORDER — ONDANSETRON HCL 4 MG PO TABS: 4.0000 mg | ORAL_TABLET | Freq: Four times a day (QID) | ORAL | Status: DC | PRN

## 2020-08-04 MED ORDER — DEXAMETHASONE SODIUM PHOSPHATE 10 MG/ML IJ SOLN
INTRAMUSCULAR | Status: DC | PRN
  Administered 2020-08-04: 5 mg via INTRAVENOUS

## 2020-08-04 MED ORDER — LACTATED RINGERS IR SOLN
Status: DC | PRN
  Administered 2020-08-04: 1000 mL

## 2020-08-04 MED ORDER — FENTANYL CITRATE (PF) 100 MCG/2ML IJ SOLN
INTRAMUSCULAR | Status: AC
  Administered 2020-08-04: 25 ug via INTRAVENOUS
  Filled 2020-08-04: qty 2

## 2020-08-04 MED ORDER — OXYCODONE HCL 5 MG PO TABS
5.0000 mg | ORAL_TABLET | Freq: Once | ORAL | Status: AC | PRN
  Administered 2020-08-04: 5 mg via ORAL

## 2020-08-04 MED ORDER — TRAMADOL HCL 50 MG PO TABS
50.0000 mg | ORAL_TABLET | Freq: Four times a day (QID) | ORAL | Status: DC | PRN
Start: 2020-08-04 — End: 2020-08-17
  Administered 2020-08-05 – 2020-08-13 (×4): 50 mg via ORAL
  Filled 2020-08-04 (×4): qty 1

## 2020-08-04 MED ORDER — NEOMYCIN SULFATE 500 MG PO TABS: 1000.0000 mg | ORAL_TABLET | ORAL | Status: DC

## 2020-08-04 MED ORDER — ENSURE PRE-SURGERY PO LIQD
296.0000 mL | Freq: Once | ORAL | Status: DC
  Filled 2020-08-04: qty 296

## 2020-08-04 MED ORDER — HYDROMORPHONE HCL 1 MG/ML IJ SOLN
INTRAMUSCULAR | Status: AC
  Administered 2020-08-04: 0.5 mg via INTRAVENOUS
  Filled 2020-08-04: qty 1

## 2020-08-04 MED ORDER — LIDOCAINE 2% (20 MG/ML) 5 ML SYRINGE
INTRAMUSCULAR | Status: AC
  Filled 2020-08-04: qty 5

## 2020-08-04 MED ORDER — PHENYLEPHRINE HCL-NACL 10-0.9 MG/250ML-% IV SOLN
INTRAVENOUS | Status: DC | PRN
  Administered 2020-08-04: 30 ug/min via INTRAVENOUS

## 2020-08-04 MED ORDER — HEPARIN SODIUM (PORCINE) 5000 UNIT/ML IJ SOLN
5000.0000 [IU] | Freq: Three times a day (TID) | INTRAMUSCULAR | Status: DC
  Administered 2020-08-05 – 2020-08-08 (×11): 5000 [IU] via SUBCUTANEOUS
  Filled 2020-08-04 (×11): qty 1

## 2020-08-04 MED ORDER — POLYETHYLENE GLYCOL 3350 17 GM/SCOOP PO POWD: 1.0000 | Freq: Once | ORAL | Status: DC

## 2020-08-04 MED ORDER — LACTATED RINGERS IV SOLN: INTRAVENOUS | Status: DC | PRN

## 2020-08-04 MED ORDER — OXYCODONE HCL 5 MG PO TABS
ORAL_TABLET | ORAL | Status: AC
  Filled 2020-08-04: qty 1

## 2020-08-04 MED ORDER — GLYCOPYRROLATE PF 0.2 MG/ML IJ SOSY
PREFILLED_SYRINGE | INTRAMUSCULAR | Status: AC
  Filled 2020-08-04: qty 1

## 2020-08-04 MED ORDER — ROCURONIUM BROMIDE 100 MG/10ML IV SOLN
INTRAVENOUS | Status: DC | PRN
  Administered 2020-08-04: 40 mg via INTRAVENOUS

## 2020-08-04 MED ORDER — ONDANSETRON HCL 4 MG/2ML IJ SOLN
INTRAMUSCULAR | Status: AC
  Filled 2020-08-04: qty 2

## 2020-08-04 MED ORDER — 0.9 % SODIUM CHLORIDE (POUR BTL) OPTIME
TOPICAL | Status: DC | PRN
  Administered 2020-08-04: 2000 mL

## 2020-08-04 MED ORDER — PHENYLEPHRINE HCL (PRESSORS) 10 MG/ML IV SOLN
INTRAVENOUS | Status: AC
  Filled 2020-08-04: qty 1

## 2020-08-04 MED ORDER — DIPHENHYDRAMINE HCL 50 MG/ML IJ SOLN
12.5000 mg | Freq: Four times a day (QID) | INTRAMUSCULAR | Status: DC | PRN
  Administered 2020-08-06: 12.5 mg via INTRAVENOUS
  Filled 2020-08-04: qty 1

## 2020-08-04 MED ORDER — BUPIVACAINE-EPINEPHRINE (PF) 0.25% -1:200000 IJ SOLN
INTRAMUSCULAR | Status: AC
  Filled 2020-08-04: qty 30

## 2020-08-04 MED ORDER — ALUM & MAG HYDROXIDE-SIMETH 200-200-20 MG/5ML PO SUSP: 30.0000 mL | Freq: Four times a day (QID) | ORAL | Status: DC | PRN

## 2020-08-04 MED ORDER — SUGAMMADEX SODIUM 200 MG/2ML IV SOLN
INTRAVENOUS | Status: DC | PRN
  Administered 2020-08-04: 100 mg via INTRAVENOUS

## 2020-08-04 MED ORDER — ACETAMINOPHEN 500 MG PO TABS
1000.0000 mg | ORAL_TABLET | Freq: Four times a day (QID) | ORAL | Status: AC
  Administered 2020-08-04 – 2020-08-11 (×27): 1000 mg via ORAL
  Filled 2020-08-04 (×29): qty 2

## 2020-08-04 MED ORDER — SIMETHICONE 80 MG PO CHEW
40.0000 mg | CHEWABLE_TABLET | Freq: Four times a day (QID) | ORAL | Status: DC | PRN
  Administered 2020-08-05: 40 mg via ORAL
  Filled 2020-08-04: qty 1

## 2020-08-04 MED ORDER — HYDRALAZINE HCL 20 MG/ML IJ SOLN
10.0000 mg | INTRAMUSCULAR | Status: DC | PRN
  Administered 2020-08-06: 10 mg via INTRAVENOUS
  Filled 2020-08-04: qty 1

## 2020-08-04 MED ORDER — LIDOCAINE 2% (20 MG/ML) 5 ML SYRINGE
INTRAMUSCULAR | Status: DC | PRN
  Administered 2020-08-04: 60 mg via INTRAVENOUS

## 2020-08-04 MED ORDER — FENTANYL CITRATE (PF) 100 MCG/2ML IJ SOLN
INTRAMUSCULAR | Status: DC | PRN
  Administered 2020-08-04 (×4): 25 ug via INTRAVENOUS

## 2020-08-04 MED ORDER — ONDANSETRON HCL 4 MG/2ML IJ SOLN
4.0000 mg | Freq: Four times a day (QID) | INTRAMUSCULAR | Status: DC | PRN
  Administered 2020-08-04: 4 mg via INTRAVENOUS
  Filled 2020-08-04: qty 2

## 2020-08-04 MED ORDER — HYDROMORPHONE HCL 1 MG/ML IJ SOLN
0.5000 mg | INTRAMUSCULAR | Status: DC | PRN
  Administered 2020-08-04: 0.5 mg via INTRAVENOUS

## 2020-08-04 SURGICAL SUPPLY — 61 items
APL PRP STRL LF DISP 70% ISPRP (MISCELLANEOUS) ×1
APPLIER CLIP ROT 10 11.4 M/L (STAPLE)
APR CLP MED LRG 11.4X10 (STAPLE)
BAG COUNTER SPONGE SURGICOUNT (BAG) IMPLANT
BAG SPNG CNTER NS LX DISP (BAG)
BLADE CLIPPER SURG (BLADE) IMPLANT
CABLE HIGH FREQUENCY MONO STRZ (ELECTRODE) ×4 IMPLANT
CELLS DAT CNTRL 66122 CELL SVR (MISCELLANEOUS) IMPLANT
CHLORAPREP W/TINT 26 (MISCELLANEOUS) ×2 IMPLANT
CLIP APPLIE ROT 10 11.4 M/L (STAPLE) IMPLANT
DECANTER SPIKE VIAL GLASS SM (MISCELLANEOUS) ×2 IMPLANT
DISSECTOR BLUNT TIP ENDO 5MM (MISCELLANEOUS) IMPLANT
DRSG OPSITE POSTOP 4X8 (GAUZE/BANDAGES/DRESSINGS) ×1 IMPLANT
ELECT REM PT RETURN 15FT ADLT (MISCELLANEOUS) ×2 IMPLANT
GAUZE SPONGE 4X4 12PLY STRL (GAUZE/BANDAGES/DRESSINGS) ×2 IMPLANT
GLOVE SURG ENC MOIS LTX SZ7.5 (GLOVE) ×4 IMPLANT
GLOVE SURG LTX SZ8 (GLOVE) ×4 IMPLANT
GOWN STRL REUS W/TWL XL LVL3 (GOWN DISPOSABLE) ×8 IMPLANT
KIT TURNOVER KIT A (KITS) ×2 IMPLANT
LIGASURE IMPACT 36 18CM CVD LR (INSTRUMENTS) IMPLANT
NS IRRIG 1000ML POUR BTL (IV SOLUTION) ×2 IMPLANT
PACK COLON (CUSTOM PROCEDURE TRAY) ×2 IMPLANT
PAD POSITIONING PINK XL (MISCELLANEOUS) IMPLANT
PENCIL SMOKE EVACUATOR (MISCELLANEOUS) IMPLANT
PROTECTOR NERVE ULNAR (MISCELLANEOUS) IMPLANT
RELOAD PROXIMATE 75MM BLUE (ENDOMECHANICALS) ×4 IMPLANT
RELOAD STAPLE 75 3.8 BLU REG (ENDOMECHANICALS) IMPLANT
RETRACTOR WND ALEXIS 18 MED (MISCELLANEOUS) IMPLANT
RTRCTR WOUND ALEXIS 18CM MED (MISCELLANEOUS)
SCISSORS LAP 5X35 DISP (ENDOMECHANICALS) ×2 IMPLANT
SEALER TISSUE G2 STRG ARTC 35C (ENDOMECHANICALS) IMPLANT
SET IRRIG TUBING LAPAROSCOPIC (IRRIGATION / IRRIGATOR) IMPLANT
SET TUBE SMOKE EVAC HIGH FLOW (TUBING) ×2 IMPLANT
SHEARS HARMONIC ACE PLUS 36CM (ENDOMECHANICALS) IMPLANT
SLEEVE ADV FIXATION 5X100MM (TROCAR) ×4 IMPLANT
SLEEVE ENDOPATH XCEL 5M (ENDOMECHANICALS) ×2 IMPLANT
STAPLER 90 3.5 STAND SLIM (STAPLE) ×2
STAPLER 90 3.5 STD SLIM (STAPLE) IMPLANT
STAPLER PROXIMATE 75MM BLUE (STAPLE) ×1 IMPLANT
STAPLER VISISTAT 35W (STAPLE) ×2 IMPLANT
SUT MNCRL AB 4-0 PS2 18 (SUTURE) ×2 IMPLANT
SUT PDS AB 1 CT1 27 (SUTURE) ×4 IMPLANT
SUT PROLENE 2 0 CT2 30 (SUTURE) IMPLANT
SUT PROLENE 2 0 KS (SUTURE) IMPLANT
SUT SILK 2 0 (SUTURE) ×2
SUT SILK 2 0 SH CR/8 (SUTURE) ×2 IMPLANT
SUT SILK 2-0 18XBRD TIE 12 (SUTURE) ×1 IMPLANT
SUT SILK 3 0 (SUTURE) ×2
SUT SILK 3 0 SH CR/8 (SUTURE) ×2 IMPLANT
SUT SILK 3-0 18XBRD TIE 12 (SUTURE) ×1 IMPLANT
SYS LAPSCP GELPORT 120MM (MISCELLANEOUS)
SYSTEM LAPSCP GELPORT 120MM (MISCELLANEOUS) IMPLANT
TAPE CLOTH 4X10 WHT NS (GAUZE/BANDAGES/DRESSINGS) IMPLANT
TOWEL OR 17X26 10 PK STRL BLUE (TOWEL DISPOSABLE) ×2 IMPLANT
TRAY FOLEY MTR SLVR 14FR STAT (SET/KITS/TRAYS/PACK) ×2 IMPLANT
TRAY FOLEY MTR SLVR 16FR STAT (SET/KITS/TRAYS/PACK) IMPLANT
TROCAR ADV FIXATION 5X100MM (TROCAR) ×2 IMPLANT
TROCAR BALLN 12MMX100 BLUNT (TROCAR) ×2 IMPLANT
TROCAR XCEL NON-BLD 11X100MML (ENDOMECHANICALS) IMPLANT
TUBING CONNECTING 10 (TUBING) ×4 IMPLANT
YANKAUER SUCT BULB TIP NO VENT (SUCTIONS) ×2 IMPLANT

## 2020-08-04 NOTE — Anesthesia Procedure Notes (Signed)
Procedure Name: Intubation Date/Time: 08/04/2020 12:25 PM Performed by: Gwyndolyn Saxon, CRNA Pre-anesthesia Checklist: Patient identified, Emergency Drugs available, Suction available and Patient being monitored Patient Re-evaluated:Patient Re-evaluated prior to induction Oxygen Delivery Method: Circle system utilized Preoxygenation: Pre-oxygenation with 100% oxygen Induction Type: IV induction Ventilation: Mask ventilation without difficulty Laryngoscope Size: Miller and 2 Grade View: Grade II Tube type: Oral Tube size: 6.5 mm Number of attempts: 1 Airway Equipment and Method: Patient positioned with wedge pillow and Stylet Placement Confirmation: ETT inserted through vocal cords under direct vision, positive ETCO2 and breath sounds checked- equal and bilateral Secured at: 20 cm Tube secured with: Tape Dental Injury: Teeth and Oropharynx as per pre-operative assessment  Comments: Intubation by Dr Daiva Huge. Grade 2b view

## 2020-08-04 NOTE — H&P (Signed)
CC: Referred for newly diagnosed colon cancer - here today for surgery   HPI: [name] [name] is a very pleasant 53yoF with hx of HTN, HLD, afib (on Xarelto), hearing loss and developed iron deficiency anemia.  She was seen at medical oncology.  She is ultimately referred to gastroenterology.  She had a colonoscopy with Dr. Marius Ditch 05/12/20 demonstrated a mass at the ileocecal valve.  This was circumferential.  Biopsies were taken.  Additional sessile polyp in the colon.  Pathology on this mass returned as adenocarcinoma.  The rectal polyp was removed demonstrated sessile serrated polyp.  She was referred to see [name] [name] in follow-up.  She is also seeing medical oncology, Dr. Janese Banks.  She had a CT of the chest completed back in February 2022 showed no evident metastatic disease in the chest - one small 86mm LUL nodule that needs a 12 mo follow-up.  INTERVAL HX CT Abd/pelvis 06/17/2020 shows asymmetric wall thickening in proximal ascending colon. Single enlarged regional lymph node. No evidence of distant metastatic disease. She took her bowel prep with satisfactory result and has been off her Xarelto for some time now - >1 month - states she was told she no longer needed it. She denies any changes in her health or health history otherwise since we talked in the office.   PMH: HTN, HLD, afib (on Xarelto), hearing loss   PSH: She denies prior abdominal surgical history   FHx: Denies FHx of colorectal, breast, endometrial, ovarian or cervical cancer   Social: Denies use of tobacco/EtOH/drugs. She is retired.  She is here today with her son who currently lives with her.  She gets around with a walker following a hip fracture couple years ago.  He reports no issues with memory.  She generally has a good appetite.   ROS: A comprehensive 10 system review of systems was completed with the patient and pertinent findings as noted above.  Past Medical History:  Diagnosis Date   Anemia    Anxiety    Cancer ([place])     Squamous Cell Carcinoma (top of head)   Constipation, chronic    Deaf, left    Diverticular disease    GERD (gastroesophageal reflux disease)    Hearing aid worn    Hearing deficit    History of Bell's palsy    left side - resolved - 50 yrs ago   Hypertension    Internal hemorrhoids    Iron deficiency anemia    Mitral regurgitation    moderate to severe on 03/27/20 echo   Osteopenia    Pure hypercholesterolemia     Past Surgical History:  Procedure Laterality Date   CATARACT EXTRACTION W/PHACO Left 11/16/2015   Procedure: CATARACT EXTRACTION PHACO AND INTRAOCULAR LENS PLACEMENT ([place]);  Surgeon: Leandrew Koyanagi, MD;  Location: [place];  Service: Ophthalmology;  Laterality: Left;  TORIC   CATARACT EXTRACTION W/PHACO Right 12/07/2015   Procedure: CATARACT EXTRACTION PHACO AND INTRAOCULAR LENS PLACEMENT ([facility]);  Surgeon: Leandrew Koyanagi, MD;  Location: [place];  Service: Ophthalmology;  Laterality: Right;  TORIC   COLONOSCOPY WITH PROPOFOL N/A 08/10/2014   Procedure: COLONOSCOPY WITH PROPOFOL;  Surgeon: Lollie Sails, MD;  Location: [facility] ENDOSCOPY;  Service: Endoscopy;  Laterality: N/A;   COLONOSCOPY WITH PROPOFOL N/A 05/12/2020   Procedure: COLONOSCOPY WITH PROPOFOL;  Surgeon: Lin Landsman, MD;  Location: [facility] ENDOSCOPY;  Service: Gastroenterology;  Laterality: N/A;   ESOPHAGOGASTRODUODENOSCOPY (EGD) WITH PROPOFOL N/A 03/28/2020   Procedure: ESOPHAGOGASTRODUODENOSCOPY (EGD) WITH PROPOFOL;  Surgeon: Lucilla Lame, MD;  Location: [facility] ENDOSCOPY;  Service: Endoscopy;  Laterality: N/A;   EYE SURGERY     FEMUR FRACTURE SURGERY     FRACTURE SURGERY     GIVENS CAPSULE STUDY N/A 04/19/2020   Procedure: GIVENS CAPSULE STUDY;  Surgeon: Lin Landsman, MD;  Location: [facility] ENDOSCOPY;  Service: Gastroenterology;  Laterality: N/A;   Nasal Polyp Removal      Family History  Problem Relation Age of Onset   Colon cancer Mother     Social:  reports that  she quit smoking about 34 years ago. Her smoking use included cigarettes. She has never used smokeless tobacco. She reports that she does not drink alcohol and does not use drugs.  Allergies:  Allergies  Allergen Reactions   Metoprolol Swelling    Feet, ankles and legs    Bactrim [Sulfamethoxazole-Trimethoprim] Other (See Comments)    Hearing loss   Ciprofloxacin Other (See Comments)    Flu-like symptoms   Pneumococcal Vaccines Other (See Comments)    Flu-like symptoms    Medications: I have reviewed the patient's current medications.  Results for orders placed or performed during the hospital encounter of 08/02/20 (from the past 48 hour(s))  SARS CORONAVIRUS 2 (TAT 6-24 HRS) Nasopharyngeal Nasopharyngeal Swab     Status: None   Collection Time: 08/02/20  1:23 PM   Specimen: Nasopharyngeal Swab  Result Value Ref Range   SARS Coronavirus 2 NEGATIVE NEGATIVE    Comment: (NOTE) SARS-CoV-2 target nucleic acids are NOT DETECTED.  The SARS-CoV-2 RNA is generally detectable in upper and lower respiratory specimens during the acute phase of infection. Negative results do not preclude SARS-CoV-2 infection, do not rule out co-infections with other pathogens, and should not be used as the sole basis for treatment or other patient management decisions. Negative results must be combined with clinical observations, patient history, and epidemiological information. The expected result is Negative.  Fact Sheet for Patients: SugarRoll.be  Fact Sheet for Healthcare Providers: https://www.woods-mathews.com/  This test is not yet approved or cleared by the [place] FDA and  has been authorized for detection and/or diagnosis of SARS-CoV-2 by FDA under an Emergency Use Authorization (EUA). This EUA will remain  in effect (meaning this test can be used) for the duration of the COVID-19 declaration under Se ction 564(b)(1) of the Act, 21  U.S.C. section 360bbb-3(b)(1), unless the authorization is terminated or revoked sooner.  Performed at [facility], [place] [address] [place], [place] [address]     No results found.  ROS - all of the below systems have been reviewed with the patient and positives are indicated with bold text General: chills, fever or night sweats Eyes: blurry vision or double vision ENT: epistaxis or sore throat Allergy/Immunology: itchy/watery eyes or nasal congestion Hematologic/Lymphatic: bleeding problems, blood clots or swollen lymph nodes Endocrine: temperature intolerance or unexpected weight changes Breast: new or changing breast lumps or nipple discharge Resp: cough, shortness of breath, or wheezing CV: chest pain or dyspnea on exertion GI: as per HPI GU: dysuria, trouble voiding, or hematuria MSK: joint pain or joint stiffness Neuro: TIA or stroke symptoms Derm: pruritus and skin lesion changes Psych: anxiety and depression  PE Blood pressure 137/66, pulse 74, temperature (!) 97.5 F (36.4 C), temperature source Oral, resp. rate 20, height 5\' 3"  (1.6 m), weight 54.4 kg, SpO2 99 %. Constitutional: NAD; conversant; no deformities Eyes: Moist conjunctiva; no lid lag; anicteric; PERRL Neck: Trachea midline; no thyromegaly Lungs: Normal  respiratory effort; no tactile fremitus CV: RRR; no palpable thrills; no pitting edema GI: Abd soft, NT/ND; no palpable hepatosplenomegaly MSK: Normal range of motion of extremities; no clubbing/cyanosis Psychiatric: Appropriate affect; alert and oriented x3 Lymphatic: No palpable cervical or axillary lymphadenopathy  Results for orders placed or performed during the hospital encounter of 08/02/20 (from the past 48 hour(s))  SARS CORONAVIRUS 2 (TAT 6-24 HRS) Nasopharyngeal Nasopharyngeal Swab     Status: None   Collection Time: 08/02/20  1:23 PM   Specimen: Nasopharyngeal Swab  Result Value Ref Range   SARS Coronavirus 2 NEGATIVE NEGATIVE     Comment: (NOTE) SARS-CoV-2 target nucleic acids are NOT DETECTED.  The SARS-CoV-2 RNA is generally detectable in upper and lower respiratory specimens during the acute phase of infection. Negative results do not preclude SARS-CoV-2 infection, do not rule out co-infections with other pathogens, and should not be used as the sole basis for treatment or other patient management decisions. Negative results must be combined with clinical observations, patient history, and epidemiological information. The expected result is Negative.  Fact Sheet for Patients: SugarRoll.be  Fact Sheet for Healthcare Providers: https://www.woods-mathews.com/  This test is not yet approved or cleared by the [place] FDA and  has been authorized for detection and/or diagnosis of SARS-CoV-2 by FDA under an Emergency Use Authorization (EUA). This EUA will remain  in effect (meaning this test can be used) for the duration of the COVID-19 declaration under Se ction 564(b)(1) of the Act, 21 U.S.C. section 360bbb-3(b)(1), unless the authorization is terminated or revoked sooner.  Performed at [facility], [place] [address], [place], [place] [address]     No results found.   A/P: Ms. 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   CT chest 03/28/20-1 5 mm nodule but no definitive evidence of metastatic disease. CT abd/pelvis 06/18/20 - asymmetric thickening of prox ascending colon with a single enlarged regional mesenteric lymph node. No evidence of metastatic disease.     -Cardiac clearance obtain from Dr. Serafina Royals -The anatomy and physiology of the GI tract was reviewed with her and her son today. The pathophysiology of colon cancer was discussed at length with associated pictures. -We had discussed laparoscopic right  hemicolectomy pending completion of the CT abdomen/pelvis. We discussed that if there is evident metastatic disease, potentially not proceeding with upfront surgery. -The planned procedure, material risks (including, but not limited to, pain, bleeding, infection, scarring, need for blood transfusion, damage to surrounding structures- blood vessels/nerves/viscus/organs, damage to ureter, urine leak, leak from anastomosis, need for additional procedures, worsening of pre-existing medical conditions, need for stoma which may be permanent, hernia, recurrence, DVT/PE, pneumonia, heart attack, stroke, death) benefits and alternatives to surgery were discussed at length. The patient's and her son's questions were answered to their satisfaction, they voiced understanding and elected to proceed with surgery. Additionally, we discussed typical postoperative expectations and the recovery process. We specifically spent time discussing the fact that with her age and needing a walker for assistance at baseline, the potential for discharge to skilled nursing facility as well based on her postoperative recovery.  Nadeen Landau, MD [facility] Use [email or web address] to contact on call provider

## 2020-08-04 NOTE — Anesthesia Postprocedure Evaluation (Signed)
Anesthesia Post Note  Patient: [name] [name]  Procedure(s) Performed: LAPAROSCOPIC RIGHT HEMI COLECTOMY (Right: Abdomen)     Patient location during evaluation: PACU Anesthesia Type: General Level of consciousness: sedated, patient cooperative and oriented Pain management: pain level controlled (much more comfortable) Vital Signs Assessment: post-procedure vital signs reviewed and stable Respiratory status: spontaneous breathing, nonlabored ventilation and respiratory function stable Cardiovascular status: blood pressure returned to baseline and stable Postop Assessment: no apparent nausea or vomiting Anesthetic complications: no   No notable events documented.  Last Vitals:  Vitals:   08/04/20 1515 08/04/20 1530  BP: 130/63 (!) 124/56  Pulse: 67 68  Resp: 11 15  Temp:  36.4 C  SpO2: 96% 97%    Last Pain:  Vitals:   08/04/20 1530  TempSrc:   PainSc: Asleep                 Adolph Clutter,E. Damonica Chopra

## 2020-08-04 NOTE — Transfer of Care (Signed)
Immediate Anesthesia Transfer of Care Note  Patient: [name] [name]  Procedure(s) Performed: LAPAROSCOPIC RIGHT HEMI COLECTOMY (Right: Abdomen)  Patient Location: PACU  Anesthesia Type:General  Level of Consciousness: drowsy  Airway & Oxygen Therapy: Patient Spontanous Breathing and Patient connected to nasal cannula oxygen  Post-op Assessment: Report given to RN and Post -op Vital signs reviewed and stable  Post vital signs: Reviewed and stable  Last Vitals:  Vitals Value Taken Time  BP 140/58 08/04/20 1417  Temp 36.4 C 08/04/20 1415  Pulse 66 08/04/20 1420  Resp 18 08/04/20 1420  SpO2 96 % 08/04/20 1420  Vitals shown include unvalidated device data.  Last Pain:  Vitals:   08/04/20 1103  TempSrc:   PainSc: 0-No pain         Complications: No notable events documented.

## 2020-08-04 NOTE — Plan of Care (Signed)
  Problem: Clinical Measurements: Goal: Ability to maintain clinical measurements within normal limits will improve Outcome: Progressing   Problem: Skin Integrity: Goal: Demonstration of wound healing without infection will improve Outcome: Progressing

## 2020-08-04 NOTE — Op Note (Signed)
PATIENT: [name] [name]  87 y.o. female  Patient Care Team: [name], MD as PCP - General (Family Medicine) Clent Jacks, RN as Oncology Nurse Navigator  PREOP DIAGNOSIS: Colon cancer of cecum  POSTOP DIAGNOSIS: Same  PROCEDURE: Laparoscopic right hemicolectomy  SURGEON: Sharon Mt. Dema Severin, MD  ASSISTANT: Leighton Ruff, MD  ANESTHESIA: General endotracheal  EBL: 15 Total I/O In: 500 [I.V.:400; IV Piggyback:100] Out: 90 [Urine:75; Blood:15]  DRAINS: None  SPECIMEN: Right colon with appendix and terminal ileum en bloc  COUNTS: Sponge, needle and instrument counts were reported correct x2  FINDINGS:  No obvious metastatic disease on visceral parietal peritoneum or liver. Mass apparent in cecum/proximal ascending colon. A right hemicolectomy was carried out with ileo-transverse anastomosis. There was palpable adenopathy along the ileocolic chain - this was included with the specimen   NARRATIVE:  The patient was identified & brought into the operating room, placed supine on the operating table and SCDs were applied to the lower extremities. General endotracheal anesthesia was induced. The patient was positioned supine with arms tucked. Antibiotics were administered. A foley catheter was placed under sterile conditions. The abdomen was prepped and draped in a sterile fashion. A timeout was performed confirming our patient and plan.   Beginning with the extraction port, a supraumbilical incision was made and carried down to the midline fascia. This was then incised with electrocautery. The peritoneum was identified and elevated between clamps and carefully opened sharply. A small Alexis wound protector with a cap and associated port was then placed. The abdomen was insufflated to 15 mmHg with Co2. A laparoscope was placed and camera inspection revealed no evidence of injury. Bilateral TAP blocks were then performed under laparoscopic visualization using a mixture  of 0.25% marcaine with epinepherine + Exparel. 3 additional ports were then placed under direct laparoscopic visualization - two in the left hemiabdomen and one in the right abdomen. The abdomen was surveyed. The liver and peritoneum appeared normal.  There were no signs of metastatic disease. There is a mass in the cecum/proximal ascending colon. The appendix and terminal ileum are normal.  The patient was positioned in trendelenburg with left side down. The ileocolic pedicle was identified. Gentle blunt dissection commenced around the pedicle and the duodenum was identified and freed from the surrounding structures.  I developed the retroperitoneal plane bluntly.  I then freed the appendix off its attachments to the pelvic wall. I mobilized the terminal ileum.  I took care to avoid injuring any retroperitoneal structures.  After this I began to mobilize laterally down the Rael Yo line of Toldt and then took down the hepatic flexure using the Enseal device. I mobilized the omentum off of the right transverse colon. The entire colon was then flipped medially and mobilized off of the retroperitoneal structures until I could visualize the lateral edge of the duodenum underneath.  I gently freed the duodenal attachments.   At this point, the abdomen was desufflated and the terminal ileum and right colon delivered through the wound protector. There is palpable adenopathy along the ileocolic chain.  We were able to get below this and taking the ileocolic pedicle rather high, this was circumferentially dissected and divided using the Enseal device.  The pedicle was inspected and noted to be completely hemostatic.  The terminal ileum was then transected using a GIA blue load stapler and a clamp placed on the staple line to help maintain orientation.  The distal point of transection was identified on the transverse colon at  a location that included the right branch of the middle colic's with the specimen.  A window was  created in the mesentery and a GIA blue load stapler was used to transect the colon.  The intervening mesentery was ligated using the Enseal device. The specimen was then passed off. Additional omentum was mobilized off of the midportion of the transverse colon.  Attention was turned to creating the anastomosis. The terminal ileum and transverse colon were inspected for orientation to ensure no twisting nor bowel included in the mesenteric defect. An anastomosis was created between the terminal ileum and the transverse colon using a 75 mm GIA blue load stapler. The staple line was inspected and noted to be hemostatic.  The common enterotomy channel was closed using a TA 90 blue load stapler. Hemostasis was achieved at the staple line using 3-0 silk U-stitches. 2-0 silk sutures were used to imbricate the corners of the staple line as well.  A 2-0 silk suture was placed securing the apex of the anastomosis. The anastomosis was palpated and noted to be widely patent. This was then placed back into the abdomen. The abdomen was then irrigated with sterile saline and hemostasis verified. The omentum was then brought down over the anastomosis. The wound protector cap was replaced and CO2 reinsufflated. The laparoscopic ports were removed under direct visualization and the sites noted to be hemostatic. The Alexis wound protector was removed, counts were reported correct, and we switched to clean instruments, gowns and drapes.   The fascia was then closed using two running #1 PDS sutures.  The skin of all incision sites was closed with 4-0 monocryl subcuticular suture. Dermabond was placed on the port sites and a sterile dressing was placed over the abdominal incision. All counts were reported correct. The patient was then awakened from anesthesia and sent to the post anesthesia care unit in stable condition.    DISPOSITION: PACU in satisfactory condition

## 2020-08-04 NOTE — Anesthesia Procedure Notes (Signed)
Procedure Name: Intubation Date/Time: 08/04/2020 12:25 PM Performed by: Brennan Bailey, MD Pre-anesthesia Checklist: Patient identified, Emergency Drugs available, Suction available and Patient being monitored Patient Re-evaluated:Patient Re-evaluated prior to induction Oxygen Delivery Method: Circle System Utilized Preoxygenation: Pre-oxygenation with 100% oxygen Induction Type: IV induction Ventilation: Mask ventilation without difficulty Laryngoscope Size: Miller and 2 Grade View: Grade II Tube type: Oral Tube size: 6.5 mm Number of attempts: 1 Airway Equipment and Method: Stylet Placement Confirmation: ETT inserted through vocal cords under direct vision, positive ETCO2 and breath sounds checked- equal and bilateral Secured at: 20 cm Tube secured with: Tape Dental Injury: Teeth and Oropharynx as per pre-operative assessment

## 2020-08-05 ENCOUNTER — Encounter (HOSPITAL_COMMUNITY): Payer: Self-pay | Admitting: Surgery

## 2020-08-05 LAB — BASIC METABOLIC PANEL
Anion gap: 10 (ref 5–15)
BUN: 12 mg/dL (ref 8–23)
CO2: 24 mmol/L (ref 22–32)
Calcium: 8.7 mg/dL — ABNORMAL LOW (ref 8.9–10.3)
Chloride: 105 mmol/L (ref 98–111)
Creatinine, Ser: 0.79 mg/dL (ref 0.44–1.00)
GFR, Estimated: >60 mL/min (ref >60)
Glucose, Bld: 109 mg/dL — ABNORMAL HIGH (ref 70–99)
Potassium: 3.2 mmol/L — ABNORMAL LOW (ref 3.5–5.1)
Sodium: 139 mmol/L (ref 135–145)

## 2020-08-05 LAB — CBC
HCT: 29.4 % — ABNORMAL LOW (ref 36.0–46.0)
Hemoglobin: 9.1 g/dL — ABNORMAL LOW (ref 12.0–15.0)
MCH: 28.6 pg (ref 26.0–34.0)
MCHC: 31 g/dL (ref 30.0–36.0)
MCV: 92.5 fL (ref 80.0–100.0)
Platelets: 217 10*3/uL (ref 150–400)
RBC: 3.18 MIL/uL — ABNORMAL LOW (ref 3.87–5.11)
RDW: 17.6 % — ABNORMAL HIGH (ref 11.5–15.5)
WBC: 12.5 10*3/uL — ABNORMAL HIGH (ref 4.0–10.5)
nRBC: 0 % (ref 0.0–0.2)

## 2020-08-05 MED ORDER — HYDROMORPHONE HCL 1 MG/ML IJ SOLN
0.5000 mg | INTRAMUSCULAR | Status: DC | PRN
Start: 2020-08-05 — End: 2020-08-07

## 2020-08-05 MED ORDER — TRAMADOL HCL 50 MG PO TABS: 50.0000 mg | ORAL_TABLET | Freq: Four times a day (QID) | ORAL | 0 refills | Status: AC | PRN

## 2020-08-05 NOTE — Progress Notes (Signed)
Foley removed per MD order. Patient due to void by [facility] today. Day shift Nurses Updated. Call bell and phone placed within reach. Patient instructed to call staff for voiding needs.   Bed exit alarm maintained and rounds continued per unit protocol and MD orders.

## 2020-08-05 NOTE — Evaluation (Signed)
Physical Therapy Evaluation Patient Details Name: [name] [name] MRN: [id number] DOB: 04-05-1929 Today's Date: 08/05/2020   History of Present Illness  Patient is an 87 year old female s/p R hemi colectomy due to colon CA. PMH includes HTN, HLD, afib, hearing loss  Clinical Impression  Pt admitted with above diagnosis.  Pt currently with functional limitations due to the deficits listed below (see PT Problem List). Pt will benefit from skilled PT to increase their independence and safety with mobility to allow discharge to the venue listed below.  Pt reports abdominal pain however agreeable to ambulate.  Pt utilized RW for more support with ambulation (typically uses rollator). Pt reports her son is at home however she feels she may need more assist if to d/c home.  Pt may benefit from SNF upon d/c if unable to have son assist and/or aide at home.   Pt uncertain about d/c plan at this time.      Follow Up Recommendations SNF    Equipment Recommendations  None recommended by PT    Recommendations for Other Services       Precautions / Restrictions Precautions Precautions: Fall Precaution Comments: abd sx, deaf left ear, hearing aide right ear Restrictions Weight Bearing Restrictions: No      Mobility  Bed Mobility Overal bed mobility: Needs Assistance Bed Mobility: Rolling;Sidelying to Sit Rolling: Min assist Sidelying to sit: Min assist       General bed mobility comments: pt in recliner    Transfers Overall transfer level: Needs assistance Equipment used: Rolling walker (2 wheeled) Transfers: Sit to/from Stand Sit to Stand: Min assist         General transfer comment: cues for hand placement, assist to rise and steady  Ambulation/Gait Ambulation/Gait assistance: Min assist;Min guard Gait Distance (Feet): 60 Feet Assistive device: Rolling walker (2 wheeled) Gait Pattern/deviations: Step-through pattern;Decreased stride length;Narrow base of support Gait  velocity: decr   General Gait Details: verbal cues for RW positioning (attempted rollator but not enough support for pt), distance to tolerance, limited by pain  Stairs            Wheelchair Mobility    Modified Rankin (Stroke Patients Only)       Balance Overall balance assessment: Needs assistance Sitting-balance support: No upper extremity supported;Feet supported Sitting balance-Leahy Scale: Fair     Standing balance support: Bilateral upper extremity supported Standing balance-Leahy Scale: Poor Standing balance comment: reliant on UE support and min A                             Pertinent Vitals/Pain Pain Assessment: Faces Faces Pain Scale: Hurts whole lot Pain Location: abdomen Pain Descriptors / Indicators: Sore;Grimacing;Discomfort;Guarding Pain Intervention(s): Ice applied;Monitored during session;Repositioned    Home Living Family/patient expects to be discharged to:: Private residence Living Arrangements: Alone Available Help at Discharge: Family;Available 24 hours/day (son working from home) Type of Home: House Home Access: Stairs to enter Entrance Stairs-Rails: Can reach both Entrance Stairs-Number of Steps: 4 Home Layout: Multi-level;1/2 bath on main level;Bed/bath upstairs Home Equipment: Walker - 2 wheels;Walker - 4 wheels;Cane - single point;Shower seat Additional Comments: pt reports her bed is upstairs and she installed a stair lift to assist in navigation    Prior Function Level of Independence: Independent with assistive device(s)         Comments: Reports she is using rollater more often however is able to ambulate home environment without AD  Hand Dominance   Dominant Hand: Right    Extremity/Trunk Assessment   Upper Extremity Assessment Upper Extremity Assessment: Generalized weakness    Lower Extremity Assessment Lower Extremity Assessment: Generalized weakness       Communication   Communication: HOH  (hearing aide R ear, deaf left ear)  Cognition Arousal/Alertness: Awake/alert Behavior During Therapy: WFL for tasks assessed/performed Overall Cognitive Status: Difficult to assess                                 General Comments: WFL for questions asked, pt very HOH, talked into right ear      General Comments      Exercises     Assessment/Plan    PT Assessment Patient needs continued PT services  PT Problem List Decreased strength;Decreased activity tolerance;Decreased balance;Decreased knowledge of use of DME;Decreased mobility       PT Treatment Interventions DME instruction;Gait training;Balance training;Therapeutic exercise;Functional mobility training;Therapeutic activities;Patient/family education    PT Goals (Current goals can be found in the Care Plan section)  Acute Rehab PT Goals Patient Stated Goal: "it hurts" PT Goal Formulation: With patient Time For Goal Achievement: 08/19/20 Potential to Achieve Goals: Good    Frequency Min 2X/week   Barriers to discharge        Co-evaluation               AM-PAC PT "6 Clicks" Mobility  Outcome Measure Help needed turning from your back to your side while in a flat bed without using bedrails?: A Little Help needed moving from lying on your back to sitting on the side of a flat bed without using bedrails?: A Little Help needed moving to and from a bed to a chair (including a wheelchair)?: A Little Help needed standing up from a chair using your arms (e.g., wheelchair or bedside chair)?: A Little Help needed to walk in hospital room?: A Little Help needed climbing 3-5 steps with a railing? : A Lot 6 Click Score: 17    End of Session Equipment Utilized During Treatment: Gait belt Activity Tolerance: Patient tolerated treatment well Patient left: in chair;with call bell/phone within reach;with chair alarm set Nurse Communication: Mobility status PT Visit Diagnosis: Other abnormalities of gait and  mobility (R26.89)    Time: 1343-1400 PT Time Calculation (min) (ACUTE ONLY): 17 min   Charges:   PT Evaluation $PT Eval Low Complexity: 1 Low        Kati PT, DPT Acute Rehabilitation Services Pager: [phone] Office: [phone]   York Ram E 08/05/2020, 3:58 PM

## 2020-08-05 NOTE — Plan of Care (Signed)
  Problem: Clinical Measurements: Goal: Ability to maintain clinical measurements within normal limits will improve 08/05/2020 0021 by Josepha Pigg, RN Outcome: Progressing 08/05/2020 0021 by Josepha Pigg, RN Outcome: Progressing 08/04/2020 2322 by Josepha Pigg, RN Outcome: Progressing   Problem: Skin Integrity: Goal: Demonstration of wound healing without infection will improve 08/05/2020 0021 by Josepha Pigg, RN Outcome: Progressing 08/05/2020 0021 by Josepha Pigg, RN Outcome: Progressing 08/04/2020 2322 by Josepha Pigg, RN Outcome: Progressing   Problem: Education: Goal: Knowledge of General Education information will improve Description: Including pain rating scale, medication(s)/side effects and non-pharmacologic comfort measures Outcome: Progressing   Problem: Health Behavior/Discharge Planning: Goal: Ability to manage health-related needs will improve Outcome: Progressing   Problem: Clinical Measurements: Goal: Ability to maintain clinical measurements within normal limits will improve Outcome: Progressing Goal: Will remain free from infection Outcome: Progressing Goal: Diagnostic test results will improve Outcome: Progressing Goal: Respiratory complications will improve Outcome: Progressing Goal: Cardiovascular complication will be avoided Outcome: Progressing   Problem: Activity: Goal: Risk for activity intolerance will decrease Outcome: Progressing   Problem: Nutrition: Goal: Adequate nutrition will be maintained Outcome: Progressing   Problem: Coping: Goal: Level of anxiety will decrease Outcome: Progressing   Problem: Elimination: Goal: Will not experience complications related to bowel motility Outcome: Progressing Goal: Will not experience complications related to urinary retention Outcome: Progressing   Problem: Pain Managment: Goal: General experience of comfort will improve Outcome: Progressing   Problem:  Safety: Goal: Ability to remain free from injury will improve Outcome: Progressing   Problem: Skin Integrity: Goal: Risk for impaired skin integrity will decrease Outcome: Progressing

## 2020-08-05 NOTE — Discharge Instructions (Signed)
POST OP INSTRUCTIONS AFTER COLON SURGERY  DIET: Be sure to include lots of fluids daily to stay hydrated - 64oz of water per day (8, 8 oz glasses).  Avoid fast food or heavy meals for the first couple of weeks as your are more likely to get nauseated. Avoid raw/uncooked fruits or vegetables for the first 4 weeks (its ok to have these if they are blended into smoothie form). If you have fruits/vegetables, make sure they are cooked until soft enough to mash on the roof of your mouth and chew your food well. Otherwise, diet as tolerated.  Take your usually prescribed home medications unless otherwise directed.  PAIN CONTROL: Pain is best controlled by a usual combination of three different methods TOGETHER: Ice/Heat Over the counter pain medication Prescription pain medication Most patients will experience some swelling and bruising around the surgical site.  Ice packs or heating pads (30-60 minutes up to 6 times a day) will help. Some people prefer to use ice alone, heat alone, alternating between ice & heat.  Experiment to what works for you.  Swelling and bruising can take several weeks to resolve.   It is helpful to take an over-the-counter pain medication regularly for the first few weeks: Ibuprofen (Motrin/Advil) - 200mg tabs - take 3 tabs (600mg) every 6 hours as needed for pain (unless you have been directed previously to avoid NSAIDs/ibuprofen) Acetaminophen (Tylenol) - you may take 650mg every 6 hours as needed. You can take this with motrin as they act differently on the body. If you are taking a narcotic pain medication that has acetaminophen in it, do not take over the counter tylenol at the same time. NOTE: You may take both of these medications together - most patients  find it most helpful when alternating between the two (i.e. Ibuprofen at 6am, tylenol at 9am, ibuprofen at 12pm ...) A  prescription for pain medication should be given to you upon discharge.  Take your pain medication as  prescribed if your pain is not adequatly controlled with the over-the-counter pain reliefs mentioned above.  Avoid getting constipated.  Between the surgery and the pain medications, it is common to experience some constipation.  Increasing fluid intake and taking a fiber supplement (such as Metamucil, Citrucel, FiberCon, MiraLax, etc) 1-2 times a day regularly will usually help prevent this problem from occurring.  A mild laxative (prune juice, Milk of Magnesia, MiraLax, etc) should be taken according to package directions if there are no bowel movements after 48 hours.    Dressing: Your incisions are covered in Dermabond which is like sterile superglue for the skin. This will come off on it's own in a couple weeks. It is waterproof and you may bathe normally starting the day after your surgery in a shower. Avoid baths/pools/lakes/oceans until your wounds have fully healed.  ACTIVITIES as tolerated:   Avoid heavy lifting (>10lbs or 1 gallon of milk) for the next 6 weeks. You may resume regular daily activities as tolerated--such as daily self-care, walking, climbing stairs--gradually increasing activities as tolerated.  If you can walk 30 minutes without difficulty, it is safe to try more intense activity such as jogging, treadmill, bicycling, low-impact aerobics.  DO NOT PUSH THROUGH PAIN.  Let pain be your guide: If it hurts to do something, don't do it. You may drive when you are no longer taking prescription pain medication, you can comfortably wear a seatbelt, and you can safely maneuver your car and apply brakes.  FOLLOW UP in our   office Please call [facility] at [phone] to set up an appointment to see your surgeon in the office for a follow-up appointment approximately 2 weeks after your surgery. Make sure that you call for this appointment the day you arrive home to insure a convenient appointment time.  9. If you have disability or family leave forms that need to be completed, you may have  them completed by your primary care physician's office; for return to work instructions, please ask our office staff and they will be happy to assist you in obtaining this documentation   When to call us [phone]: Poor pain control Reactions / problems with new medications (rash/itching, etc)  Fever over 101.5 F (38.5 C) Inability to urinate Nausea/vomiting Worsening swelling or bruising Continued bleeding from incision. Increased pain, redness, or drainage from the incision  The clinic staff is available to answer your questions during regular business hours (8:30am-5pm).  Please don't hesitate to call and ask to speak to one of our nurses for clinical concerns.   A surgeon from [facility] is always on call at the hospitals   If you have a medical emergency, go to the nearest emergency room or call 911.  [facility] [address], [address], [place], [place]  [address] MAIN: [phone] FAX: [phone] [email or web address]  

## 2020-08-05 NOTE — Evaluation (Signed)
Occupational Therapy Evaluation Patient Details Name: [name] [name] MRN: [id number] DOB: Aug 27, 1929 Today's Date: 08/05/2020    History of Present Illness Patient is an 87 year old female s/p R hemi colectomy due to colon CA. PMH includes HTN, HLD, afib, hearing loss   Clinical Impression   Patient typically lives alone, however states her son from [place] has been living with her for about a year and can work from home. At baseline patient is independent with self care, uses rollator as needed. Currently patient limited by pain in abdomen despite recent pain medication from RN ~30 minutes prior to OT arrival. Cue patient in log roll technique needing min A for leg management out of bed. With cues for hand placement patient min A to power up to standing and to ambulate with rollator to bathroom. Patient needing min A to power up to standing from toilet due to pain and using rolling walker as patient having to push through arms to alleviate pain. If patient's son able to provide current assist levels and with pain improvement anticipate patient will be able to D/C home, if not then would recommend ST rehab. Acute OT to follow.    Follow Up Recommendations  Home health OT;Supervision/Assistance - 24 hour;Other (comment) (is son can provide assist, if not SNF)    Equipment Recommendations  3 in 1 bedside commode       Precautions / Restrictions Precautions Precautions: Fall Precaution Comments: abd sx Restrictions Weight Bearing Restrictions: No      Mobility Bed Mobility Overal bed mobility: Needs Assistance Bed Mobility: Rolling;Sidelying to Sit Rolling: Min assist Sidelying to sit: Min assist       General bed mobility comments: cues for log roll, min A to manage legs off bed    Transfers Overall transfer level: Needs assistance Equipment used: Rolling walker (2 wheeled);4-wheeled walker Transfers: Sit to/from Stand Sit to Stand: Min assist         General  transfer comment: initially using patient's rollator however brakes loose and patient needing to push down on arms to assist with pain management therefore used rolling walker to get from bathroom to recliner. needing increased time and cues for safety + sequencing    Balance Overall balance assessment: Needs assistance Sitting-balance support: No upper extremity supported;Feet supported Sitting balance-Leahy Scale: Fair     Standing balance support: Bilateral upper extremity supported;Single extremity supported Standing balance-Leahy Scale: Poor Standing balance comment: reliant on UE support and min A                           ADL either performed or assessed with clinical judgement   ADL Overall ADL's : Needs assistance/impaired     Grooming: Wash/dry hands;Set up;Sitting   Upper Body Bathing: Minimal assistance;Sitting   Lower Body Bathing: Maximal assistance;Sitting/lateral leans Lower Body Bathing Details (indicate cue type and reason): due to pain Upper Body Dressing : Minimal assistance;Sitting   Lower Body Dressing: Maximal assistance;Sitting/lateral leans;Sit to/from stand Lower Body Dressing Details (indicate cue type and reason): due to pain Toilet Transfer: Minimal assistance;Cueing for safety;Cueing for sequencing;Ambulation;RW;Regular Glass blower/designer Details (indicate cue type and reason): patient closing her eyes due to pain needing cues for safety as well as for hand placement. min A to power up to standing due to pain Toileting- Clothing Manipulation and Hygiene: Moderate assistance;Sitting/lateral lean;Sit to/from stand Toileting - Clothing Manipulation Details (indicate cue type and reason): patient able to wash peri area  after voiding with min A in standing, needing assist to wash buttock as patient was soiled with urine upon arrival     Functional mobility during ADLs: Minimal assistance;Cueing for safety;Cueing for sequencing;Rolling  walker General ADL Comments: patient requiring increased assistance with self care tasks due to pain impacting overall activity tolerance, safety awareness      Pertinent Vitals/Pain Pain Assessment: Faces Faces Pain Scale: Hurts even more Pain Location: abdomen Pain Descriptors / Indicators: Sore;Grimacing;Discomfort;Guarding Pain Intervention(s): Other (comment) (ask RN, not due for pain medication)     Hand Dominance Right   Extremity/Trunk Assessment Upper Extremity Assessment Upper Extremity Assessment: Generalized weakness   Lower Extremity Assessment Lower Extremity Assessment: Defer to PT evaluation       Communication Communication Communication: HOH;Deaf   Cognition Arousal/Alertness: Awake/alert Behavior During Therapy: WFL for tasks assessed/performed Overall Cognitive Status: No family/caregiver present to determine baseline cognitive functioning                                 General Comments: patient is oriented to place, time, recent sx however upon entering her room patient pointing to floor saying "out there" when asked to specify what is wrong patient does not clarify              Home Living Family/patient expects to be discharged to:: Private residence Living Arrangements: Alone Available Help at Discharge: Family;Available 24 hours/day (son working from home) Type of Home: House Home Access: Stairs to enter Technical brewer of Steps: 4 Entrance Stairs-Rails: Can reach both Home Layout: Multi-level;1/2 bath on main level;Bed/bath upstairs Alternate Level Stairs-Number of Steps: flight   Bathroom Shower/Tub: Walk-in shower;Tub/shower unit   Bathroom Toilet: Standard Bathroom Accessibility: Yes   Home Equipment: Walker - 2 wheels;Walker - 4 wheels;Cane - single point;Shower seat   Additional Comments: pt reports her bed is upstairs and she installed a stair lift to assist in navigation      Prior  Functioning/Environment Level of Independence: Independent with assistive device(s)        Comments: Reports she is using rollater more often however is able to ambulate home environment without AD        OT Problem List: Pain;Decreased strength;Decreased activity tolerance;Impaired balance (sitting and/or standing);Decreased safety awareness;Decreased knowledge of precautions      OT Treatment/Interventions: Self-care/ADL training;Balance training;Patient/family education;DME and/or AE instruction;Therapeutic activities;Therapeutic exercise    OT Goals(Current goals can be found in the care plan section) Acute Rehab OT Goals Patient Stated Goal: "it hurts" OT Goal Formulation: With patient Time For Goal Achievement: 08/19/20 Potential to Achieve Goals: Good  OT Frequency: Min 2X/week    AM-PAC OT "6 Clicks" Daily Activity     Outcome Measure Help from another person eating meals?: A Little Help from another person taking care of personal grooming?: A Little Help from another person toileting, which includes using toliet, bedpan, or urinal?: A Lot Help from another person bathing (including washing, rinsing, drying)?: A Lot Help from another person to put on and taking off regular upper body clothing?: A Little Help from another person to put on and taking off regular lower body clothing?: A Lot 6 Click Score: 15   End of Session Equipment Utilized During Treatment: Rolling walker Nurse Communication: Mobility status;Other (comment) (patient pain levels)  Activity Tolerance: Patient limited by pain Patient left: in chair;with call bell/phone within reach;with chair alarm set  OT Visit Diagnosis: Unsteadiness on feet (  R26.81);Other abnormalities of gait and mobility (R26.89);Pain;Muscle weakness (generalized) (M62.81) Pain - Right/Left: Right Pain - part of body:  (abdomen)                Time: 6962-9528 OT Time Calculation (min): 33 min Charges:  OT General Charges $OT  Visit: 1 Visit OT Evaluation $OT Eval Moderate Complexity: 1 Mod OT Treatments $Self Care/Home Management : 8-22 mins  Delbert Phenix OT OT pager: [place] 08/05/2020, 1:49 PM

## 2020-08-05 NOTE — Progress Notes (Signed)
Subjective No acute events. Feeling well. Denies nausea or vomiting. Denies bloating/distention. No flatus/BM yet  Objective: Vital signs in last 24 hours: Temp:  [97.3 F (36.3 C)-99 F (37.2 C)] 99 F (37.2 C) (07/08 0506) Pulse Rate:  [55-78] 77 (07/08 0506) Resp:  [11-18] 18 (07/08 0506) BP: (116-155)/(50-74) 116/59 (07/08 0506) SpO2:  [95 %-100 %] 97 % (07/08 0506) Weight:  [52.9 kg] 52.9 kg (07/08 0600) Last BM Date:  (utd)  Intake/Output from previous day: 07/07 0701 - 07/08 0700 In: 1334.3 [P.O.:240; I.V.:994.3; IV Piggyback:100] Out: 590 [Urine:575; Blood:15] Intake/Output this shift: Total I/O In: 464.5 [P.O.:440; I.V.:24.5] Out: 0   Gen: NAD, comfortable CV: RRR Pulm: Normal work of breathing Abd: Soft, minimal incisional tenderness, nondistended; incisions c/d/I without erythema or drainage Ext: SCDs in place  Lab Results: CBC  Recent Labs    08/05/20 0425  WBC 12.5*  HGB 9.1*  HCT 29.4*  PLT 217   BMET Recent Labs    08/05/20 0425  NA 139  K 3.2*  CL 105  CO2 24  GLUCOSE 109*  BUN 12  CREATININE 0.79  CALCIUM 8.7*   PT/INR No results for input(s): LABPROT, INR in the last 72 hours. ABG No results for input(s): PHART, HCO3 in the last 72 hours.  Invalid input(s): PCO2, PO2  Studies/Results:  Anti-infectives: Anti-infectives (From admission, onward)    Start     Dose/Rate Route Frequency Ordered Stop   08/04/20 1400  neomycin (MYCIFRADIN) tablet 1,000 mg  Status:  Discontinued       See Hyperspace for full Linked Orders Report.   1,000 mg Oral 3 times per day 08/04/20 1043 08/04/20 1048   08/04/20 1400  metroNIDAZOLE (FLAGYL) tablet 1,000 mg  Status:  Discontinued       See Hyperspace for full Linked Orders Report.   1,000 mg Oral 3 times per day 08/04/20 1043 08/04/20 1048   08/04/20 0600  cefoTEtan (CEFOTAN) 2 g in sodium chloride 0.9 % 100 mL IVPB        2 g 200 mL/hr over 30 Minutes Intravenous On call to O.R. 08/03/20 0743  08/04/20 1256        Assessment/Plan: Patient Active Problem List   Diagnosis Date Noted   Colon cancer ([place]) 08/04/2020   Colonic mass    Closed intertrochanteric fracture ([place]) 04/12/2020   Colles' fracture 04/12/2020   Osteoarthritis of knee 04/12/2020   Symptomatic anemia 03/25/2020   HLD (hyperlipidemia) 03/25/2020   Bilateral leg edema 02/04/2020   Rapid atrial fibrillation ([place]) 01/11/2020   Chronic anticoagulation 01/11/2020   Chronic ulcer of left leg ([place]) 01/11/2020   Iron deficiency anemia 01/11/2020   Hypokalemia 01/01/2020   Left leg cellulitis 01/01/2020   Iron deficiency anemia due to chronic blood loss 01/01/2020   Wound cellulitis 12/31/2019   Aortic atherosclerosis ([place]) 09/25/2019   Hard of hearing 08/31/2019   History of pelvic fracture 08/31/2019   Pelvic fracture ([place]) 08/25/2019   Permanent atrial fibrillation ([place]) 08/25/2019   Pain    Dehydration    Accidental fall 07/26/2019   UTI (urinary tract infection) 07/26/2019   Closed fracture of right inferior pubic ramus ([place]) 07/26/2019   HTN (hypertension) 07/26/2019   Ambulatory dysfunction 07/26/2019   Pubic bone fracture ([place]) 07/26/2019   History of colon polyps 06/25/2017   Constipation, chronic 07/27/2013   Osteopenia 07/27/2013   s/p Procedure(s): LAPAROSCOPIC RIGHT HEMI COLECTOMY 08/04/2020  -We reviewed her procedure, findings and plans moving forward -Clear liquids,  advance to full liquids as tolerated -Ambulate -PT/OT -MIVF - decrease today -Foley out -Ppx: SQH, SCDs  Path pending   LOS: 1 day   Nadeen Landau, MD [facility] Use [email or web address] to contact on call provider

## 2020-08-06 LAB — CBC
HCT: 29.8 % — ABNORMAL LOW (ref 36.0–46.0)
Hemoglobin: 8.9 g/dL — ABNORMAL LOW (ref 12.0–15.0)
MCH: 28.5 pg (ref 26.0–34.0)
MCHC: 29.9 g/dL — ABNORMAL LOW (ref 30.0–36.0)
MCV: 95.5 fL (ref 80.0–100.0)
Platelets: 289 10*3/uL (ref 150–400)
RBC: 3.12 MIL/uL — ABNORMAL LOW (ref 3.87–5.11)
RDW: 17.7 % — ABNORMAL HIGH (ref 11.5–15.5)
WBC: 9.1 10*3/uL (ref 4.0–10.5)
nRBC: 0 % (ref 0.0–0.2)

## 2020-08-06 LAB — BASIC METABOLIC PANEL
Anion gap: 7 (ref 5–15)
BUN: 18 mg/dL (ref 8–23)
CO2: 28 mmol/L (ref 22–32)
Calcium: 8.7 mg/dL — ABNORMAL LOW (ref 8.9–10.3)
Chloride: 103 mmol/L (ref 98–111)
Creatinine, Ser: 0.72 mg/dL (ref 0.44–1.00)
GFR, Estimated: >60 mL/min (ref >60)
Glucose, Bld: 82 mg/dL (ref 70–99)
Potassium: 2.8 mmol/L — ABNORMAL LOW (ref 3.5–5.1)
Sodium: 138 mmol/L (ref 135–145)

## 2020-08-06 LAB — GLUCOSE, CAPILLARY: Glucose-Capillary: 135 mg/dL — ABNORMAL HIGH (ref 70–99)

## 2020-08-06 MED ORDER — POTASSIUM CHLORIDE 20 MEQ PO PACK
40.0000 meq | PACK | Freq: Every day | ORAL | Status: DC
  Administered 2020-08-06 – 2020-08-12 (×7): 40 meq via ORAL
  Filled 2020-08-06 (×8): qty 2

## 2020-08-06 MED ORDER — POTASSIUM CHLORIDE 10 MEQ/100ML IV SOLN
10.0000 meq | INTRAVENOUS | Status: AC
  Administered 2020-08-06 (×3): 10 meq via INTRAVENOUS
  Filled 2020-08-06 (×3): qty 100

## 2020-08-06 MED ORDER — POTASSIUM CHLORIDE 10 MEQ/100ML IV SOLN
10.0000 meq | Freq: Once | INTRAVENOUS | Status: AC
  Administered 2020-08-06: 10 meq via INTRAVENOUS
  Filled 2020-08-06: qty 100

## 2020-08-06 NOTE — Progress Notes (Addendum)
2 Days Post-Op  Subjective: Tolerating full liquids. Denies nausea. Endorses small amounts of flatus. Potassium 2.8 this morning.   Objective: Vital signs in last 24 hours: Temp:  [98 F (36.7 C)-98.8 F (37.1 C)] 98 F (36.7 C) (07/09 0545) Pulse Rate:  [75-80] 76 (07/09 0545) Resp:  [16-18] 17 (07/09 0545) BP: (137-155)/(66-75) 146/75 (07/09 0545) SpO2:  [90 %-98 %] 93 % (07/09 0545) Weight:  [56.4 kg] 56.4 kg (07/09 0545) Last BM Date:  (utd)  Intake/Output from previous day: 07/08 0701 - 07/09 0700 In: 944.5 [P.O.:920; I.V.:24.5] Out: 400 [Urine:400] Intake/Output this shift: No intake/output data recorded.  PE: General: resting comfortably, NAD Neuro: alert, mildly confused Resp: normal work of breathing on room air Abdomen: soft, mildly disteneded, nontender to palpation. Incisions clean and dry with no erythema, induration or drainage. Extremities: warm and well-perfused   Lab Results:  Recent Labs    08/05/20 0425 08/06/20 0404  WBC 12.5* 9.1  HGB 9.1* 8.9*  HCT 29.4* 29.8*  PLT 217 289   BMET Recent Labs    08/05/20 0425 08/06/20 0404  NA 139 138  K 3.2* 2.8*  CL 105 103  CO2 24 28  GLUCOSE 109* 82  BUN 12 18  CREATININE 0.79 0.72  CALCIUM 8.7* 8.7*   PT/INR No results for input(s): LABPROT, INR in the last 72 hours. CMP     Component Value Date/Time   NA 138 08/06/2020 0404   NA 137 07/08/2012 0633   K 2.8 (L) 08/06/2020 0404   K 4.1 07/08/2012 0633   CL 103 08/06/2020 0404   CL 102 07/08/2012 0633   CO2 28 08/06/2020 0404   CO2 31 07/08/2012 0633   GLUCOSE 82 08/06/2020 0404   GLUCOSE 74 07/08/2012 0633   BUN 18 08/06/2020 0404   BUN 16 07/08/2012 0633   CREATININE 0.72 08/06/2020 0404   CREATININE 0.73 07/08/2012 0633   CALCIUM 8.7 (L) 08/06/2020 0404   CALCIUM 9.5 07/08/2012 0633   PROT 7.3 07/28/2020 1516   PROT 8.1 05/31/2012 2019   ALBUMIN 3.5 07/28/2020 1516   ALBUMIN 4.0 05/31/2012 2019   AST 21 07/28/2020 1516    AST 24 05/31/2012 2019   ALT 16 07/28/2020 1516   ALT 26 05/31/2012 2019   ALKPHOS 71 07/28/2020 1516   ALKPHOS 52 05/31/2012 2019   BILITOT 0.3 07/28/2020 1516   BILITOT 0.4 05/31/2012 2019   GFRNONAA >60 08/06/2020 0404   GFRNONAA >60 07/08/2012 0633   GFRAA >60 08/26/2019 0458   GFRAA >60 07/08/2012 1610   Lipase  No results found for: LIPASE     Studies/Results: No results found.  Anti-infectives: Anti-infectives (From admission, onward)    Start     Dose/Rate Route Frequency Ordered Stop   08/04/20 1400  neomycin (MYCIFRADIN) tablet 1,000 mg  Status:  Discontinued       See Hyperspace for full Linked Orders Report.   1,000 mg Oral 3 times per day 08/04/20 1043 08/04/20 1048   08/04/20 1400  metroNIDAZOLE (FLAGYL) tablet 1,000 mg  Status:  Discontinued       See Hyperspace for full Linked Orders Report.   1,000 mg Oral 3 times per day 08/04/20 1043 08/04/20 1048   08/04/20 0600  cefoTEtan (CEFOTAN) 2 g in sodium chloride 0.9 % 100 mL IVPB        2 g 200 mL/hr over 30 Minutes Intravenous On call to O.R. 08/03/20 9604 08/04/20 1256  Assessment/Plan  87 yo female POD2 s/p lap right hemicolectomy. - Advance to soft diet - SLIV - PT/OT evaluated, recommending SNF - Replete potassium (PO and IV) - VTE: SQH, SCDs - Dispo: inpatient    LOS: 2 days    Michaelle Birks, MD [facility] General, Hepatobiliary and Pancreatic Surgery 08/06/20 9:02 AM

## 2020-08-06 NOTE — TOC Initial Note (Signed)
Transition of Care [facility]) - Initial/Assessment Note    Patient Details  Name: [name] [name] MRN: [id number] Date of Birth: 03/21/1929  Transition of [facility]) CM/SW Contact:    Lennart Pall, LCSW Phone Number: 08/06/2020, 2:46 PM  Clinical Narrative:                 Met with pt and spoke with son via phone to introduce self/ TOC role.  Discussed therapy recommendations of 24/7 assist in home or short term SNF rehab.  Son reports that he does live with pt and is able to provide 24/7 support.  They are open to considering short term SNF if strongly recommended but would only want [facility] (pt has been there before).  Would like to see how she does with next therapy sessions as we may be able to dc home with [facility] and son's support.  Expected Discharge Plan: [place] (vs. Home with home health) Barriers to Discharge: Continued Medical Work up   Patient Goals and CMS Choice Patient states their goals for this hospitalization and ongoing recovery are:: prefers to return home      Expected Discharge Plan and Services Expected Discharge Plan: [place] (vs. Home with home health) In-house Referral: Clinical Social Work     Living arrangements for the past 2 months: Single Family Home                                      Prior Living Arrangements/Services Living arrangements for the past 2 months: Single Family Home Lives with:: Adult Children Patient language and need for interpreter reviewed:: Yes Do you feel safe going back to the place where you live?: Yes      Need for Family Participation in Patient Care: Yes (Comment) Care giver support system in place?: Yes (comment)   Criminal Activity/Legal Involvement Pertinent to Current Situation/Hospitalization: No - Comment as needed  Activities of Daily Living Home Assistive Devices/Equipment: Walker (specify type) ADL Screening (condition at time of admission) Patient's cognitive ability  adequate to safely complete daily activities?: Yes Is the patient deaf or have difficulty hearing?: Yes Does the patient have difficulty seeing, even when wearing glasses/contacts?: No Does the patient have difficulty concentrating, remembering, or making decisions?: No Patient able to express need for assistance with ADLs?: Yes Does the patient have difficulty dressing or bathing?: Yes Independently performs ADLs?: No Communication: Appropriate for developmental age Does the patient have difficulty walking or climbing stairs?: Yes Weakness of Legs: Both Weakness of Arms/Hands: Both  Permission Sought/Granted Permission sought to share information with : Family Supports Permission granted to share information with : Yes, Verbal Permission Granted  Share Information with NAME: [name] [name]     Permission granted to share info w Relationship: son  Permission granted to share info w Contact Information: [phone]  Emotional Assessment Appearance:: Appears stated age Attitude/Demeanor/Rapport: Engaged, Gracious Affect (typically observed): Accepting Orientation: : Oriented to Self, Oriented to Place, Oriented to  Time, Oriented to Situation Alcohol / Substance Use: Not Applicable Psych Involvement: No (comment)  Admission diagnosis:  Colon cancer [facility]) [C18.9] Patient Active Problem List   Diagnosis Date Noted   Colon cancer ([place]) 08/04/2020   Colonic mass    Closed intertrochanteric fracture ([place]) 04/12/2020   Colles' fracture 04/12/2020   Osteoarthritis of knee 04/12/2020   Symptomatic anemia 03/25/2020   HLD (hyperlipidemia) 03/25/2020   Bilateral  leg edema 02/04/2020   Rapid atrial fibrillation ([place]) 01/11/2020   Chronic anticoagulation 01/11/2020   Chronic ulcer of left leg ([place]) 01/11/2020   Iron deficiency anemia 01/11/2020   Hypokalemia 01/01/2020   Left leg cellulitis 01/01/2020   Iron deficiency anemia due to chronic blood loss 01/01/2020   Wound cellulitis  12/31/2019   Aortic atherosclerosis ([place]) 09/25/2019   Hard of hearing 08/31/2019   History of pelvic fracture 08/31/2019   Pelvic fracture ([place]) 08/25/2019   Permanent atrial fibrillation ([place]) 08/25/2019   Pain    Dehydration    Accidental fall 07/26/2019   UTI (urinary tract infection) 07/26/2019   Closed fracture of right inferior pubic ramus ([place]) 07/26/2019   HTN (hypertension) 07/26/2019   Ambulatory dysfunction 07/26/2019   Pubic bone fracture ([place]) 07/26/2019   History of colon polyps 06/25/2017   Constipation, chronic 07/27/2013   Osteopenia 07/27/2013   PCP:  [name], MD Pharmacy:   [facility] [address], [place] [address] Phone: [phone] Fax: [phone]     Social Determinants of Health (SDOH) Interventions    Readmission Risk Interventions No flowsheet data found.

## 2020-08-07 ENCOUNTER — Other Ambulatory Visit (HOSPITAL_COMMUNITY): Payer: Medicare Other

## 2020-08-07 ENCOUNTER — Encounter: Payer: Self-pay | Admitting: Surgery

## 2020-08-07 ENCOUNTER — Inpatient Hospital Stay (HOSPITAL_COMMUNITY): Payer: Medicare Other

## 2020-08-07 DIAGNOSIS — K219 Gastro-esophageal reflux disease without esophagitis: Secondary | ICD-10-CM | POA: Insufficient documentation

## 2020-08-07 DIAGNOSIS — I340 Nonrheumatic mitral (valve) insufficiency: Secondary | ICD-10-CM | POA: Insufficient documentation

## 2020-08-07 DIAGNOSIS — Z8669 Personal history of other diseases of the nervous system and sense organs: Secondary | ICD-10-CM | POA: Insufficient documentation

## 2020-08-07 DIAGNOSIS — L89152 Pressure ulcer of sacral region, stage 2: Secondary | ICD-10-CM | POA: Insufficient documentation

## 2020-08-07 DIAGNOSIS — C182 Malignant neoplasm of ascending colon: Secondary | ICD-10-CM

## 2020-08-07 DIAGNOSIS — L8990 Pressure ulcer of unspecified site, unspecified stage: Secondary | ICD-10-CM | POA: Insufficient documentation

## 2020-08-07 DIAGNOSIS — R410 Disorientation, unspecified: Secondary | ICD-10-CM

## 2020-08-07 DIAGNOSIS — K5790 Diverticulosis of intestine, part unspecified, without perforation or abscess without bleeding: Secondary | ICD-10-CM | POA: Insufficient documentation

## 2020-08-07 DIAGNOSIS — J810 Acute pulmonary edema: Secondary | ICD-10-CM

## 2020-08-07 LAB — TROPONIN I (HIGH SENSITIVITY)
Troponin I (High Sensitivity): 149 ng/L (ref <18)
Troponin I (High Sensitivity): 140 ng/L (ref <18)

## 2020-08-07 LAB — BLOOD GAS, ARTERIAL
Acid-base deficit: 0 mmol/L (ref 0.0–2.0)
Bicarbonate: 22.1 mmol/L (ref 20.0–28.0)
FIO2: 28
O2 Saturation: 92 %
Patient temperature: 98.6
pCO2 arterial: 28.4 mmHg — ABNORMAL LOW (ref 32.0–48.0)
pH, Arterial: 7.502 — ABNORMAL HIGH (ref 7.350–7.450)
pO2, Arterial: 67.3 mmHg — ABNORMAL LOW (ref 83.0–108.0)

## 2020-08-07 LAB — MAGNESIUM: Magnesium: 2 mg/dL (ref 1.7–2.4)

## 2020-08-07 LAB — CBC
HCT: 35.8 % — ABNORMAL LOW (ref 36.0–46.0)
Hemoglobin: 10.8 g/dL — ABNORMAL LOW (ref 12.0–15.0)
MCH: 28.4 pg (ref 26.0–34.0)
MCHC: 30.2 g/dL (ref 30.0–36.0)
MCV: 94.2 fL (ref 80.0–100.0)
Platelets: 395 10*3/uL (ref 150–400)
RBC: 3.8 MIL/uL — ABNORMAL LOW (ref 3.87–5.11)
RDW: 17.5 % — ABNORMAL HIGH (ref 11.5–15.5)
WBC: 12.2 10*3/uL — ABNORMAL HIGH (ref 4.0–10.5)
nRBC: 0 % (ref 0.0–0.2)

## 2020-08-07 LAB — BASIC METABOLIC PANEL
Anion gap: 9 (ref 5–15)
BUN: 19 mg/dL (ref 8–23)
CO2: 26 mmol/L (ref 22–32)
Calcium: 9 mg/dL (ref 8.9–10.3)
Chloride: 106 mmol/L (ref 98–111)
Creatinine, Ser: 0.69 mg/dL (ref 0.44–1.00)
GFR, Estimated: >60 mL/min (ref >60)
Glucose, Bld: 127 mg/dL — ABNORMAL HIGH (ref 70–99)
Potassium: 4.2 mmol/L (ref 3.5–5.1)
Sodium: 141 mmol/L (ref 135–145)

## 2020-08-07 LAB — MRSA NEXT GEN BY PCR, NASAL: MRSA by PCR Next Gen: NOT DETECTED

## 2020-08-07 LAB — BRAIN NATRIURETIC PEPTIDE: B Natriuretic Peptide: 2411.8 pg/mL — ABNORMAL HIGH (ref 0.0–100.0)

## 2020-08-07 LAB — PHOSPHORUS: Phosphorus: 2.7 mg/dL (ref 2.5–4.6)

## 2020-08-07 MED ORDER — HAIR/SKIN/NAILS PO CAPS: 3.0000 | ORAL_CAPSULE | Freq: Every day | ORAL | Status: DC

## 2020-08-07 MED ORDER — CALCIUM POLYCARBOPHIL 625 MG PO TABS
625.0000 mg | ORAL_TABLET | Freq: Two times a day (BID) | ORAL | Status: DC
  Administered 2020-08-07 – 2020-08-16 (×19): 625 mg via ORAL
  Filled 2020-08-07 (×20): qty 1

## 2020-08-07 MED ORDER — ENALAPRILAT 1.25 MG/ML IV SOLN
0.6250 mg | Freq: Four times a day (QID) | INTRAVENOUS | Status: DC | PRN
  Filled 2020-08-07: qty 1

## 2020-08-07 MED ORDER — ADULT MULTIVITAMIN W/MINERALS CH
1.0000 | ORAL_TABLET | Freq: Every day | ORAL | Status: DC
  Administered 2020-08-07 – 2020-08-16 (×10): 1 via ORAL
  Filled 2020-08-07 (×10): qty 1

## 2020-08-07 MED ORDER — AMIODARONE HCL 200 MG PO TABS
200.0000 mg | ORAL_TABLET | Freq: Every day | ORAL | Status: DC
  Administered 2020-08-07 – 2020-08-09 (×4): 200 mg via ORAL
  Filled 2020-08-07 (×3): qty 1

## 2020-08-07 MED ORDER — DILTIAZEM HCL 25 MG/5ML IV SOLN
5.0000 mg | Freq: Once | INTRAVENOUS | Status: AC
  Administered 2020-08-07: 5 mg via INTRAVENOUS
  Filled 2020-08-07: qty 5

## 2020-08-07 MED ORDER — FLUTICASONE PROPIONATE 50 MCG/ACT NA SUSP
1.0000 | Freq: Every day | NASAL | Status: DC
  Administered 2020-08-07 – 2020-08-16 (×10): 1 via NASAL
  Filled 2020-08-07 (×2): qty 16

## 2020-08-07 MED ORDER — FUROSEMIDE 10 MG/ML IJ SOLN
40.0000 mg | Freq: Two times a day (BID) | INTRAMUSCULAR | Status: DC
  Administered 2020-08-07 (×2): 40 mg via INTRAVENOUS
  Filled 2020-08-07 (×2): qty 4

## 2020-08-07 MED ORDER — VERAPAMIL HCL ER 180 MG PO TBCR
180.0000 mg | EXTENDED_RELEASE_TABLET | Freq: Every day | ORAL | Status: DC
  Administered 2020-08-07: 180 mg via ORAL
  Filled 2020-08-07 (×2): qty 1

## 2020-08-07 MED ORDER — FUROSEMIDE 10 MG/ML IJ SOLN
40.0000 mg | Freq: Once | INTRAMUSCULAR | Status: AC
  Administered 2020-08-07: 40 mg via INTRAVENOUS
  Filled 2020-08-07: qty 4

## 2020-08-07 MED ORDER — PANTOPRAZOLE SODIUM 40 MG PO TBEC
40.0000 mg | DELAYED_RELEASE_TABLET | Freq: Two times a day (BID) | ORAL | Status: DC
  Administered 2020-08-07 – 2020-08-16 (×20): 40 mg via ORAL
  Filled 2020-08-07 (×20): qty 1

## 2020-08-07 MED ORDER — CHLORHEXIDINE GLUCONATE CLOTH 2 % EX PADS
6.0000 | MEDICATED_PAD | Freq: Every day | CUTANEOUS | Status: DC
  Administered 2020-08-07 – 2020-08-15 (×10): 6 via TOPICAL

## 2020-08-07 MED ORDER — FUROSEMIDE 10 MG/ML IJ SOLN: 40.0000 mg | Freq: Every day | INTRAMUSCULAR | Status: DC

## 2020-08-07 MED ORDER — MENTHOL 3 MG MT LOZG
1.0000 | LOZENGE | OROMUCOSAL | Status: DC | PRN
  Filled 2020-08-07: qty 9

## 2020-08-07 MED ORDER — FUROSEMIDE 40 MG PO TABS
40.0000 mg | ORAL_TABLET | Freq: Every day | ORAL | Status: DC
  Administered 2020-08-07: 40 mg via ORAL
  Filled 2020-08-07: qty 1

## 2020-08-07 MED ORDER — PHENOL 1.4 % MT LIQD
2.0000 | OROMUCOSAL | Status: DC | PRN
  Filled 2020-08-07: qty 177

## 2020-08-07 MED ORDER — POTASSIUM CHLORIDE 10 MEQ/100ML IV SOLN
10.0000 meq | INTRAVENOUS | Status: AC
  Administered 2020-08-07 (×4): 10 meq via INTRAVENOUS
  Filled 2020-08-07 (×4): qty 100

## 2020-08-07 MED ORDER — MAGIC MOUTHWASH
15.0000 mL | Freq: Four times a day (QID) | ORAL | Status: DC | PRN
  Filled 2020-08-07: qty 15

## 2020-08-07 MED ORDER — LIP MEDEX EX OINT
1.0000 "application " | TOPICAL_OINTMENT | Freq: Two times a day (BID) | CUTANEOUS | Status: DC
  Administered 2020-08-07 – 2020-08-16 (×20): 1 via TOPICAL
  Filled 2020-08-07 (×3): qty 7

## 2020-08-07 MED ORDER — POLYSACCHARIDE IRON COMPLEX 150 MG PO CAPS
150.0000 mg | ORAL_CAPSULE | Freq: Two times a day (BID) | ORAL | Status: DC
  Administered 2020-08-07 – 2020-08-16 (×19): 150 mg via ORAL
  Filled 2020-08-07 (×19): qty 1

## 2020-08-07 MED ORDER — CALCIUM CARBONATE ANTACID 500 MG PO CHEW: 500.0000 mg | CHEWABLE_TABLET | Freq: Every day | ORAL | Status: DC | PRN

## 2020-08-07 MED ORDER — FENTANYL CITRATE (PF) 100 MCG/2ML IJ SOLN
12.5000 ug | INTRAMUSCULAR | Status: DC | PRN
  Administered 2020-08-08: 25 ug via INTRAVENOUS
  Administered 2020-08-08: 12.5 ug via INTRAVENOUS
  Filled 2020-08-07 (×3): qty 2

## 2020-08-07 NOTE — Progress Notes (Addendum)
   08/06/20 2346  Vitals  Temp 98.6 F (37 C)  Temp Source Oral  BP (!) 154/73  MAP (mmHg) 94  BP Location Right Arm  BP Method Automatic  Patient Position (if appropriate) Lying  Pulse Rate (!) 121  Pulse Rate Source Monitor  Level of Consciousness  Level of Consciousness Alert  MEWS COLOR  MEWS Score Color Yellow  Oxygen Therapy  SpO2 96 %  O2 Device Nasal Cannula  O2 Flow Rate (L/min) 2 L/min  Patient Activity (if Appropriate) In bed  Pulse Oximetry Type Continuous  MEWS Score  MEWS Temp 0  MEWS Systolic 0  MEWS Pulse 2  MEWS RR 0  MEWS LOC 0  MEWS Score 2  Rapid  Response Nurse and RT at the bedside. Yellow MEWS noted. ABG's obtained. EKG Completed. Bedside monitoring continued.

## 2020-08-07 NOTE — Progress Notes (Signed)
Paged CCS, MD to place pt on telemetry floor due to increased HR, and cardiac medications.

## 2020-08-07 NOTE — Progress Notes (Signed)
Note: Portions of this report may have been transcribed using voice recognition software. Every effort was made to ensure accuracy; however, inadvertent computerized transcription errors may be present.   Any transcriptional errors that result from this process are unintentional.              [name] [name]  October 19, 1929 [id number]  Patient Care Team: [name], MD as PCP - General (Family Medicine) Clent Jacks, RN as Oncology Nurse Navigator [name], MD as Consulting Physician (Colon and Rectal Surgery) Lin Landsman, MD as Consulting Physician (Gastroenterology)    Called by concern with rapid response nursing.  Patient having complaint of shortness of breath with some tachycardia.  Blood gas argues against hypercarbia.  Chest x-ray showing some pulmonary edema.  EKG showing sinus tachycardia.  Post colectomy is to [facility] patient postop day 1 which appears to have been done.  Was given IV potassium as only IV today.  Since +pulmonary edema, positive I's and O's, and Lasix dependent; will give IV Lasix.  Will give extra potassium since she was rather hypokalemic already.  continue oral supplementation.  Resume home amiodarone and calcium channel blocker.  Normally would give metoprolol for breakthrough but apparently has intolerance to that.  Resume Protonix with Tums for breakthrough heartburn reflux.  Check BMP and labs as well.  Check magnesium  If not improved, medicine consultation to help follow.  Patient Active Problem List   Diagnosis Date Noted   Malignant neoplasm of ascending colon ([place]) 06/01/2020   Colonic mass    Closed intertrochanteric fracture ([place]) 04/12/2020   Colles' fracture 04/12/2020   Osteoarthritis of knee 04/12/2020   Symptomatic anemia 03/25/2020   HLD (hyperlipidemia) 03/25/2020   Bilateral leg edema 02/04/2020   Rapid atrial fibrillation ([place]) 01/11/2020   Chronic anticoagulation 01/11/2020   Chronic ulcer  of left leg ([place]) 01/11/2020   Iron deficiency anemia 01/11/2020   Hypokalemia 01/01/2020   Left leg cellulitis 01/01/2020   Iron deficiency anemia due to chronic blood loss 01/01/2020   Wound cellulitis 12/31/2019   Aortic atherosclerosis ([place]) 09/25/2019   Hard of hearing 08/31/2019   History of pelvic fracture 08/31/2019   Pelvic fracture ([place]) 08/25/2019   Permanent atrial fibrillation ([place]) 08/25/2019   Pain    Dehydration    Accidental fall 07/26/2019   UTI (urinary tract infection) 07/26/2019   Closed fracture of right inferior pubic ramus ([place]) 07/26/2019   HTN (hypertension) 07/26/2019   Ambulatory dysfunction 07/26/2019   Pubic bone fracture ([place]) 07/26/2019   History of colon polyps 06/25/2017   Constipation, chronic 07/27/2013   Osteopenia 07/27/2013    Past Medical History:  Diagnosis Date   Anemia    Anxiety    Cancer ([place])    Squamous Cell Carcinoma (top of head)   Constipation, chronic    Deaf, left    Diverticular disease    GERD (gastroesophageal reflux disease)    Hearing aid worn    Hearing deficit    History of Bell's palsy    left side - resolved - 50 yrs ago   Hypertension    Internal hemorrhoids    Iron deficiency anemia    Mitral regurgitation    moderate to severe on 03/27/20 echo   Osteopenia    Pure hypercholesterolemia     Past Surgical History:  Procedure Laterality Date   CATARACT EXTRACTION W/PHACO Left 11/16/2015   Procedure: CATARACT EXTRACTION PHACO AND INTRAOCULAR LENS PLACEMENT ([place]);  Surgeon: Leandrew Koyanagi, MD;  Location: [place];  Service: Ophthalmology;  Laterality: Left;  TORIC   CATARACT EXTRACTION W/PHACO Right 12/07/2015   Procedure: CATARACT EXTRACTION PHACO AND INTRAOCULAR LENS PLACEMENT (IOC);  Surgeon: Leandrew Koyanagi, MD;  Location: [place];  Service: Ophthalmology;  Laterality: Right;  TORIC   COLONOSCOPY WITH PROPOFOL N/A 08/10/2014   Procedure: COLONOSCOPY WITH PROPOFOL;  Surgeon:  Lollie Sails, MD;  Location: [facility] ENDOSCOPY;  Service: Endoscopy;  Laterality: N/A;   COLONOSCOPY WITH PROPOFOL N/A 05/12/2020   Procedure: COLONOSCOPY WITH PROPOFOL;  Surgeon: Lin Landsman, MD;  Location[facility] ENDOSCOPY;  Service: Gastroenterology;  Laterality: N/A;   ESOPHAGOGASTRODUODENOSCOPY (EGD) WITH PROPOFOL N/A 03/28/2020   Procedure: ESOPHAGOGASTRODUODENOSCOPY (EGD) WITH PROPOFOL;  Surgeon: Lucilla Lame, MD;  Location: [facility] ENDOSCOPY;  Service: Endoscopy;  Laterality: N/A;   EYE SURGERY     FEMUR FRACTURE SURGERY     FRACTURE SURGERY     GIVENS CAPSULE STUDY N/A 04/19/2020   Procedure: GIVENS CAPSULE STUDY;  Surgeon: Lin Landsman, MD;  Location: [facility] ENDOSCOPY;  Service: Gastroenterology;  Laterality: N/A;   LAPAROSCOPIC RIGHT HEMI COLECTOMY Right 08/04/2020   Procedure: LAPAROSCOPIC RIGHT HEMI COLECTOMY;  Surgeon: [name], MD;  Location: [facility];  Service: General;  Laterality: Right;   Nasal Polyp Removal      Social History   Socioeconomic History   Marital status: Widowed    Spouse name: Not on file   Number of children: Not on file   Years of education: Not on file   Highest education level: Not on file  Occupational History   Not on file  Tobacco Use   Smoking status: Former    Pack years: 0.00    Types: Cigarettes    Quit date: 10/30/1985    Years since quitting: 34.7   Smokeless tobacco: Never  Vaping Use   Vaping Use: Never used  Substance and Sexual Activity   Alcohol use: No   Drug use: No   Sexual activity: Not on file  Other Topics Concern   Not on file  Social History Narrative   Not on file   Social Determinants of Health   Financial Resource Strain: Not on file  Food Insecurity: Not on file  Transportation Needs: Not on file  Physical Activity: Not on file  Stress: Not on file  Social Connections: Not on file  Intimate Partner Violence: Not on file    Family History  Problem Relation Age of Onset   Colon cancer  Mother     Current Facility-Administered Medications  Medication Dose Route Frequency Provider Last Rate Last Admin   acetaminophen (TYLENOL) tablet 1,000 mg  1,000 mg Oral Q6H [name], MD   1,000 mg at 08/07/20 0002   alum & mag hydroxide-simeth (MAALOX/MYLANTA) 200-200-20 MG/5ML suspension 30 mL  30 mL Oral Q6H PRN [name], MD       alvimopan (ENTEREG) capsule 12 mg  12 mg Oral BID [name], MD   12 mg at 08/06/20 2141   amiodarone (PACERONE) tablet 200 mg  200 mg Oral Daily Michael Boston, MD       calcium carbonate (TUMS - dosed in mg elemental calcium) chewable tablet 500 mg  500 mg Oral Daily PRN Michael Boston, MD       diphenhydrAMINE (BENADRYL) 12.5 MG/5ML elixir 12.5 mg  12.5 mg Oral Q6H PRN [name], MD       Or   diphenhydrAMINE (BENADRYL) injection 12.5 mg  12.5 mg Intravenous Q6H PRN [name], MD   12.5 mg at 08/06/20 2142   enalaprilat (VASOTEC) injection 0.625-1.25 mg  0.625-1.25 mg Intravenous Q6H PRN Michael Boston, MD       feeding supplement (ENSURE SURGERY) liquid 237 mL  237 mL Oral BID BM [name], MD   237 mL at 08/06/20 1145   fluticasone (FLONASE) 50 MCG/ACT nasal spray 1 spray  1 spray Each Nare Daily Michael Boston, MD       furosemide (LASIX) injection 40 mg  40 mg Intravenous Once Michael Boston, MD       furosemide (LASIX) tablet 40 mg  40 mg Oral Daily Michael Boston, MD       Hair/Skin/Nails CAPS 3 capsule  3 capsule Oral Daily Michael Boston, MD       heparin injection 5,000 Units  5,000 Units Subcutaneous Q8H [name], MD   5,000 Units at 08/06/20 2141   iron polysaccharides (NIFEREX) capsule 150 mg  150 mg Oral BID Michael Boston, MD       lip balm (CARMEX) ointment 1 application  1 application Topical BID Michael Boston, MD       magic mouthwash  15 mL Oral QID PRN Michael Boston, MD       menthol-cetylpyridinium (CEPACOL) lozenge 3 mg  1 lozenge Oral PRN Michael Boston, MD        multivitamin with minerals tablet 1 tablet  1 tablet Oral Daily Michael Boston, MD       ondansetron [facility]) tablet 4 mg  4 mg Oral Q6H PRN [name], MD       Or   ondansetron [facility]) injection 4 mg  4 mg Intravenous Q6H PRN [name], MD   4 mg at 08/04/20 1614   pantoprazole (PROTONIX) EC tablet 40 mg  40 mg Oral BID Rogelia Mire, MD       phenol (CHLORASEPTIC) mouth spray 2 spray  2 spray Mouth/Throat PRN Michael Boston, MD       polycarbophil (FIBERCON) tablet 625 mg  625 mg Oral BID Michael Boston, MD       potassium chloride (KLOR-CON) packet 40 mEq  40 mEq Oral Daily Dwan Bolt, MD   40 mEq at 08/06/20 1133   potassium chloride 10 mEq in 100 mL IVPB  10 mEq Intravenous Q1 Hr x 4 Jalyn Dutta, Remo Lipps, MD       simethicone [facility]) chewable tablet 40 mg  40 mg Oral Q6H PRN [name], MD   40 mg at 08/05/20 1947   traMADol (ULTRAM) tablet 50 mg  50 mg Oral Q6H PRN [name], MD   50 mg at 08/05/20 1810   verapamil (CALAN-SR) CR tablet 180 mg  180 mg Oral Daily Michael Boston, MD         Allergies  Allergen Reactions   Metoprolol Swelling    Feet, ankles and legs    Bactrim [Sulfamethoxazole-Trimethoprim] Other (See Comments)    Hearing loss   Ciprofloxacin Other (See Comments)    Flu-like symptoms   Pneumococcal Vaccines Other (See Comments)    Flu-like symptoms    BP (!) 154/73 (BP Location: Right Arm)   Pulse (!) 121   Temp 98.6 F (37 C) (Oral)   Resp 18   Ht 5\' 3"  (1.6 m)   Wt 56.4 kg   SpO2 96%   BMI 22.03 kg/m   DG CHEST PORT 1 VIEW  Result Date: 08/07/2020  CLINICAL DATA:  Shortness of breath EXAM: PORTABLE CHEST 1 VIEW COMPARISON:  03/31/2020 FINDINGS: Cardiac shadow is within normal limits. Aortic calcifications are seen. Increased vascular congestion is noted with bilateral pleural effusions left greater than right and mild interstitial edema consistent with CHF. No bony abnormality is noted. IMPRESSION: Changes of CHF  with bilateral effusions and pulmonary edema. Electronically Signed   By: Inez Catalina M.D.   On: 08/07/2020 00:22

## 2020-08-07 NOTE — Consult Note (Addendum)
. Medical Consultation  [name] [name] YQM:[id number] DOB: 19-Jan-1930 DOA: 08/04/2020 PCP: [name], MD   Requesting physician: Dr. Johney Maine Date of consultation: 08/07/20 Reason for consultation: Dyspnea, heart failure  Impression/Recommendations Acute on chronic HFpEF     - check echo     - given IV lasix 40mg  early this morning. Let's continue this as BID dosing for now (her BP and renal function appear to be able to tolerate it)     - daily wt, I&Os, modify current diet to include fluid restriction  Pleural effusions     - she has had pleural effusions noted on imaging going back to at least March of this year     - right now, treat pulm edema and see if dyspnea improves; if it doesn't consider thora  A fib HTN     - continue amio, verapamil  Normocytic anemia     - no evidence of bleed, follow  HLD     - continue statins  Colon adenocarcinoma     - per primary team  UPDATE: Pt c/o chest pressure: "feels like I have fluid on me"; EKG is sinus w/o st elevations. Checked trp. Initial is elevated to 149. Spoke with oncall cards (1831hrs). Rec'd continuing to trend trp. Await echo. Continue diuresing. Appreciate assistance.   TR[facility] will follow-up again tomorrow. Please contact me if I can be of assistance in the meanwhile. Thank you for this consultation.  Chief Complaint: colon mass  HPI:  [name] [name] is a 87 y.o. female with medical history significant of HTN, afib, HFpEF. Presenting for colon section w/ general surgery due to colon mass. She has been following with oncology, GI, and general surgery. Collectively, they may the decision for management of her colon CA to include surgical intervention. She completed the procedure w/o incident. Per consulting physician, she had an episode of hypoxia and dyspnea last night. A CXR was obtained. It showed pulmonary edema and pleural effusions. She was given lasix. TRH was called for assistance.    Review of  Systems:  Denies CP, palpitations, N/V/D, fevers. Remainder of ROS is negative for all not mentioned in HPI.   Past Medical History:  Diagnosis Date   Anemia    Anxiety    Cancer ([place])    Squamous Cell Carcinoma (top of head)   Constipation, chronic    Deaf, left    Diverticular disease    GERD (gastroesophageal reflux disease)    Hearing aid worn    Hearing deficit    History of Bell's palsy    left side - resolved - 50 yrs ago   Hypertension    Internal hemorrhoids    Iron deficiency anemia    Mitral regurgitation    moderate to severe on 03/27/20 echo   Osteopenia    Pure hypercholesterolemia    Past Surgical History:  Procedure Laterality Date   CATARACT EXTRACTION W/PHACO Left 11/16/2015   Procedure: CATARACT EXTRACTION PHACO AND INTRAOCULAR LENS PLACEMENT ([place]);  Surgeon: Leandrew Koyanagi, MD;  Location: [place];  Service: Ophthalmology;  Laterality: Left;  TORIC   CATARACT EXTRACTION W/PHACO Right 12/07/2015   Procedure: CATARACT EXTRACTION PHACO AND INTRAOCULAR LENS PLACEMENT (I[facility]);  Surgeon: Leandrew Koyanagi, MD;  Location: [place];  Service: Ophthalmology;  Laterality: Right;  TORIC   COLONOSCOPY WITH PROPOFOL N/A 08/10/2014   Procedure: COLONOSCOPY WITH PROPOFOL;  Surgeon: Lollie Sails, MD;  Location[facility] ENDOSCOPY;  Service: Endoscopy;  Laterality: N/A;   COLONOSCOPY  WITH PROPOFOL N/A 05/12/2020   Procedure: COLONOSCOPY WITH PROPOFOL;  Surgeon: Lin Landsman, MD;  Location: [facility] ENDOSCOPY;  Service: Gastroenterology;  Laterality: N/A;   ESOPHAGOGASTRODUODENOSCOPY (EGD) WITH PROPOFOL N/A 03/28/2020   Procedure: ESOPHAGOGASTRODUODENOSCOPY (EGD) WITH PROPOFOL;  Surgeon: Lucilla Lame, MD;  Location: [facility] ENDOSCOPY;  Service: Endoscopy;  Laterality: N/A;   EYE SURGERY     FEMUR FRACTURE SURGERY     FRACTURE SURGERY     GIVENS CAPSULE STUDY N/A 04/19/2020   Procedure: GIVENS CAPSULE STUDY;  Surgeon: Lin Landsman, MD;  Location:  [facility] ENDOSCOPY;  Service: Gastroenterology;  Laterality: N/A;   LAPAROSCOPIC RIGHT HEMI COLECTOMY Right 08/04/2020   Procedure: LAPAROSCOPIC RIGHT HEMI COLECTOMY;  Surgeon: [name], MD;  [facility];  Service: General;  Laterality: Right;   Nasal Polyp Removal     Social History:  reports that she quit smoking about 34 years ago. Her smoking use included cigarettes. She has never used smokeless tobacco. She reports that she does not drink alcohol and does not use drugs.  Allergies  Allergen Reactions   Metoprolol Swelling    Feet, ankles and legs    Bactrim [Sulfamethoxazole-Trimethoprim] Other (See Comments)    Hearing loss   Ciprofloxacin Other (See Comments)    Flu-like symptoms   Pneumococcal Vaccines Other (See Comments)    Flu-like symptoms   Family History  Problem Relation Age of Onset   Colon cancer Mother     Prior to Admission medications   Medication Sig Start Date End Date Taking? Authorizing Provider  acetaminophen (TYLENOL) 500 MG tablet Take 1 tablet (500 mg total) by mouth every 6 (six) hours as needed. Patient taking differently: Take 1,000 mg by mouth every 6 (six) hours as needed for moderate pain. 04/02/20  Yes Enzo Bi, MD  amiodarone (PACERONE) 200 MG tablet Take 1 tablet (200 mg total) by mouth daily. 01/17/20  Yes Nicole Kindred A, DO  calcium carbonate (TUMS - DOSED IN MG ELEMENTAL CALCIUM) 500 MG chewable tablet Chew 500 mg by mouth daily as needed for indigestion or heartburn.   Yes [provider]  fluticasone (FLONASE) 50 MCG/ACT nasal spray Place 1 spray into both nostrils daily.   Yes [provider]  iron polysaccharides (NIFEREX) 150 MG capsule Take 1 capsule (150 mg total) by mouth daily. Patient taking differently: Take 150 mg by mouth 2 (two) times daily. 01/03/20  Yes Sharen Hones, MD  lovastatin (MEVACOR) 40 MG tablet Take 40 mg by mouth every evening.   Yes [provider]  Multiple Vitamin  (MULTIVITAMIN WITH MINERALS) TABS tablet Take 1 tablet by mouth daily.   Yes [provider]  Multiple Vitamins-Minerals (HAIR/SKIN/NAILS) CAPS Take 3 capsules by mouth daily.   Yes [provider]  pantoprazole (PROTONIX) 40 MG tablet Take 1 tablet (40 mg total) by mouth 2 (two) times daily before a meal. Patient taking differently: Take 40 mg by mouth 2 (two) times daily as needed (acid reflux). 04/02/20 08/04/20 Yes Enzo Bi, MD  Polyethyl Glycol-Propyl Glycol (SYSTANE OP) Place 1 drop into both eyes in the morning and at bedtime.   Yes [provider]  polyethylene glycol powder (GLYCOLAX/MIRALAX) 17 GM/SCOOP powder Take 17 g by mouth 2 (two) times daily as needed for moderate constipation.   Yes [provider]  traMADol (ULTRAM) 50 MG tablet Take 1 tablet (50 mg total) by mouth every 6 (six) hours as needed for up to 5 days. 08/07/20 08/12/20 Yes Nadeen Landau  M, MD  tretinoin (RETIN-A) 0.05 % cream Apply 1 application topically at bedtime. 07/19/20  Yes [provider]  verapamil (CALAN-SR) 180 MG CR tablet Take 180 mg by mouth daily. 09/25/19 09/24/20 Yes [provider]  furosemide (LASIX) 40 MG tablet Take 1 tablet (40 mg total) by mouth daily. Patient not taking: No sig reported 01/17/20   Ezekiel Slocumb, DO   Physical Exam: Blood pressure (!) 167/86, pulse 93, temperature 98.3 F (36.8 C), temperature source Oral, resp. rate 19, height 5\' 3"  (1.6 m), weight 53.9 kg, SpO2 99 %. Vitals:   08/07/20 0800 08/07/20 0916  BP:  (!) 167/86  Pulse:    Resp:    Temp: 98.3 F (36.8 C)   SpO2:      General: 87 y.o. female resting in bed in NAD Eyes: PERRL, normal sclera ENMT: Nares patent w/o discharge, orophaynx clear, dentition normal, ears w/o discharge/lesions/ulcers Neck: Supple, trachea midline Cardiovascular: RRR, +S1, S2, no m/g/r, equal pulses throughout Respiratory: CTABL, no w/r/r, normal WOB GI: BS+, NDNT, no masses  noted, no organomegaly noted MSK: No e/c/c Neuro: A&O and follows commands; other that [facility], no focal deficits Psyc: Appropriate interaction and affect, calm/cooperative  Labs on Admission:  Basic Metabolic Panel: Recent Labs  Lab 08/05/20 0425 08/06/20 0404 08/07/20 0457  NA 139 138 141  K 3.2* 2.8* 4.2  CL 105 103 106  CO2 24 28 26   GLUCOSE 109* 82 127*  BUN 12 18 19   CREATININE 0.79 0.72 0.69  CALCIUM 8.7* 8.7* 9.0  MG  --   --  2.0  PHOS  --   --  2.7   Liver Function Tests: No results for input(s): AST, ALT, ALKPHOS, BILITOT, PROT, ALBUMIN in the last 168 hours. No results for input(s): LIPASE, AMYLASE in the last 168 hours. No results for input(s): AMMONIA in the last 168 hours. CBC: Recent Labs  Lab 08/05/20 0425 08/06/20 0404 08/07/20 0457  WBC 12.5* 9.1 12.2*  HGB 9.1* 8.9* 10.8*  HCT 29.4* 29.8* 35.8*  MCV 92.5 95.5 94.2  PLT 217 289 395   Cardiac Enzymes: No results for input(s): CKTOTAL, CKMB, CKMBINDEX, TROPONINI in the last 168 hours. BNP: Invalid input(s): POCBNP CBG: Recent Labs  Lab 08/06/20 2350  GLUCAP 135*    Radiological Exams on Admission: DG CHEST PORT 1 VIEW  Result Date: 08/07/2020 CLINICAL DATA:  Shortness of breath EXAM: PORTABLE CHEST 1 VIEW COMPARISON:  03/31/2020 FINDINGS: Cardiac shadow is within normal limits. Aortic calcifications are seen. Increased vascular congestion is noted with bilateral pleural effusions left greater than right and mild interstitial edema consistent with CHF. No bony abnormality is noted. IMPRESSION: Changes of CHF with bilateral effusions and pulmonary edema. Electronically Signed   By: Inez Catalina M.D.   On: 08/07/2020 00:22    EKG: None obtained  Time spent: 45 minutes  [place] [facility]  If 7PM-7AM, please contact night-coverage [email or web address] 08/07/2020, 9:43 AM

## 2020-08-07 NOTE — Progress Notes (Addendum)
[name] [name] [id number] 19-May-1929  CARE TEAM:  PCP: [name], MD  Outpatient Care Team: Patient Care Team: [name], MD as PCP - General (Family Medicine) Clent Jacks, RN as Oncology Nurse Navigator Dema Severin, Sharon Mt, MD as Consulting Physician (Colon and Rectal Surgery) Lin Landsman, MD as Consulting Physician (Gastroenterology)  Inpatient Treatment Team: Treatment Team: Attending Provider: [name], MD; Technician: Ernest Mallick, NT; Utilization Review: Orlean Bradford, RN; Technician: Lewanda Rife, NT; Technician: Antoine Primas; Technician: Eden Lathe, NT; Utilization Review: Conception Oms, RN; Registered Nurse: Rodrigo Ran, RN; Consulting Physician: Jonnie Finner, DO   Problem List:   Principal Problem:   Cancer of ascending colon s/p lap colectomy 08/04/2020 Active Problems:   HOH (hard of hearing)   HTN (hypertension)   Permanent atrial fibrillation ([place])   Iron deficiency anemia due to chronic blood loss   Chronic anticoagulation   Iron deficiency anemia   Constipation, chronic   Pressure injury of skin   Confusion, postoperative   Acute pulmonary edema ([place])   3 Days Post-Op  08/04/2020  PREOP DIAGNOSIS: Colon cancer of cecum   POSTOP DIAGNOSIS: Same   PROCEDURE: Laparoscopic right hemicolectomy   SURGEON: Sharon Mt. White, MD      Assessment  Postoperative pulmonary edema in the setting of hypertension and diuretic dependence  [facility] Stay = 3 days)  Plan:  Diuresis given with some improvement.  Again trying keep on the dry side.  Medicine consultation given elevated BNP, hypoxia, and multiple medical problems.  I have sent out a page for request consult.  Hypokalemia aggressively replaced and seems better for now.  Keep on standing potassium given anticipated repeat diuresis.  Hypertension control.  Back on amiodarone and verapamil.  Defer to medicine if  further adjustments or cardiology consultation needed.  No strong evidence of breakthrough atrial fibrillation at this time.  Improve pain control.  Hold on solid diet until cardiopulmonary issues stabilized.  Keep in stepdown unit until cardiopulmonary issues stabilized.  -VTE prophylaxis- SCDs, subcutaneous heparin.  Okay to fully anticoagulated if needed as long as hemoglobin monitored.  -mobilize as tolerated to help recovery  Disposition: TBA  I updated the patient's status to the patient and nurse  Recommendations were made.  Questions were answered.  Patient somewhat confused at first but seem to understand after discussion.  They expressed understanding & appreciation.         minutes spent in review, evaluation, examination, counseling, and coordination of care.   I have reviewed this patient's available data, including medical history, events of note, physical examination and test results as part of my evaluation.  A significant portion of that time was spent in counseling.  Care during the described time interval was provided by me.  08/07/2020    Subjective: (Chief complaint)  Respiratory distress and hypoxia improved with oxygen and Lasix.  Transferred to stepdown unit for closer monitoring.  Patient with some abdominal soreness but no severe pain.  Stepdown nursing just outside room.  Objective:  Vital signs:  Vitals:   08/07/20 0400 08/07/20 0500 08/07/20 0600 08/07/20 0700  BP: (!) 151/76 (!) 165/85 (!) 160/83 (!) 158/85  Pulse: 99 98 96 93  Resp: (!) 28 (!) 26 (!) 22 19  Temp:      TempSrc:      SpO2: 97% 93% 97% 99%  Weight:      Height:        Last BM  Date: 08/06/20 (prior to transfer)  Intake/Output   Yesterday:  07/09 0701 - 07/10 0700 In: 1075.6 [P.O.:360; IV Piggyback:715.6] Out: 2350 [Urine:2350] This shift:  Total I/O In: 71.8 [IV Piggyback:71.8] Out: -   Bowel function:  Flatus: No  BM:  YES  Drain: (No  drain)   Physical Exam:  General: Pt awake/alert in mild acute distress Eyes: PERRL, normal EOM.  Sclera clear.  No icterus Neuro: CN II-XII intact w/o focal sensory/motor deficits. Lymph: No head/neck/groin lymphadenopathy Psych:  No delerium/psychosis/paranoia.  Oriented x 2 HENT: Normocephalic, Mucus membranes moist.  No thrush Neck: Supple, No tracheal deviation.  No obvious thyromegaly Chest: No pain to chest wall compression.  OK respiratory excursion.  No audible wheezing ? Crackles at bases CV:  Pulses intact.  Regular rhythm.  No major extremity edema MS: Normal AROM mjr joints.  No obvious deformity  Abdomen: Soft.  Mildy distended.  Mildly tender at incisions only.  No evidence of peritonitis.  No incarcerated hernias.  Ext:  No deformity.  No mjr edema.  No cyanosis Skin: No petechiae / purpurea.  No major sores.  Warm and dry    Results:   Cultures: Recent Results (from the past 720 hour(s))  Surgical pcr screen     Status: None   Collection Time: 07/28/20  3:16 PM   Specimen: Nasal Mucosa; Nasal Swab  Result Value Ref Range Status   MRSA, PCR NEGATIVE NEGATIVE Final   Staphylococcus aureus NEGATIVE NEGATIVE Final    Comment: (NOTE) The Xpert SA Assay (FDA approved for NASAL specimens in patients 13 years of age and older), is one component of a comprehensive surveillance program. It is not intended to diagnose infection nor to guide or monitor treatment. Performed at [facility], [place] [address], [place], [place] [address]   SARS CORONAVIRUS 2 (TAT 6-24 HRS) Nasopharyngeal Nasopharyngeal Swab     Status: None   Collection Time: 08/02/20  1:23 PM   Specimen: Nasopharyngeal Swab  Result Value Ref Range Status   SARS Coronavirus 2 NEGATIVE NEGATIVE Final    Comment: (NOTE) SARS-CoV-2 target nucleic acids are NOT DETECTED.  The SARS-CoV-2 RNA is generally detectable in upper and lower respiratory specimens during the acute phase of  infection. Negative results do not preclude SARS-CoV-2 infection, do not rule out co-infections with other pathogens, and should not be used as the sole basis for treatment or other patient management decisions. Negative results must be combined with clinical observations, patient history, and epidemiological information. The expected result is Negative.  Fact Sheet for Patients: SugarRoll.be  Fact Sheet for Healthcare Providers: https://www.woods-mathews.com/  This test is not yet approved or cleared by the [place] FDA and  has been authorized for detection and/or diagnosis of SARS-CoV-2 by FDA under an Emergency Use Authorization (EUA). This EUA will remain  in effect (meaning this test can be used) for the duration of the COVID-19 declaration under Se ction 564(b)(1) of the Act, 21 U.S.C. section 360bbb-3(b)(1), unless the authorization is terminated or revoked sooner.  Performed at [facility], [place] [address], [place], [place] [address]   MRSA Next Gen by PCR, Nasal     Status: None   Collection Time: 08/07/20  3:31 AM   Specimen: Nasal Mucosa; Nasal Swab  Result Value Ref Range Status   MRSA by PCR Next Gen NOT DETECTED NOT DETECTED Final    Comment: (NOTE) The GeneXpert MRSA Assay (FDA approved for NASAL specimens only), is one component  of a comprehensive MRSA colonization surveillance program. It is not intended to diagnose MRSA infection nor to guide or monitor treatment for MRSA infections. Test performance is not FDA approved in patients less than 42 years old. Performed at [facility], [place] [address] [place], [place] [address]     Labs: Results for orders placed or performed during the hospital encounter of 08/04/20 (from the past 48 hour(s))  Basic metabolic panel     Status: Abnormal   Collection Time: 08/06/20  4:04 AM  Result Value Ref Range   Sodium 138 135 - 145 mmol/L    Potassium 2.8 (L) 3.5 - 5.1 mmol/L   Chloride 103 98 - 111 mmol/L   CO2 28 22 - 32 mmol/L   Glucose, Bld 82 70 - 99 mg/dL    Comment: Glucose reference range applies only to samples taken after fasting for at least 8 hours.   BUN 18 8 - 23 mg/dL   Creatinine, Ser 0.72 0.44 - 1.00 mg/dL   Calcium 8.7 (L) 8.9 - 10.3 mg/dL   GFR, Estimated >60 >60 mL/min    Comment: (NOTE) Calculated using the CKD-EPI Creatinine Equation (2021)    Anion gap 7 5 - 15    Comment: Performed at [facility], [place] [address] [place], [place] [address]  CBC     Status: Abnormal   Collection Time: 08/06/20  4:04 AM  Result Value Ref Range   WBC 9.1 4.0 - 10.5 K/uL   RBC 3.12 (L) 3.87 - 5.11 MIL/uL   Hemoglobin 8.9 (L) 12.0 - 15.0 g/dL   HCT 29.8 (L) 36.0 - 46.0 %   MCV 95.5 80.0 - 100.0 fL   MCH 28.5 26.0 - 34.0 pg   MCHC 29.9 (L) 30.0 - 36.0 g/dL   RDW 17.7 (H) 11.5 - 15.5 %   Platelets 289 150 - 400 K/uL   nRBC 0.0 0.0 - 0.2 %    Comment: Performed at [facility], [place] [address], [place],  [place] [address]  Glucose, capillary     Status: Abnormal   Collection Time: 08/06/20 11:50 PM  Result Value Ref Range   Glucose-Capillary 135 (H) 70 - 99 mg/dL    Comment: Glucose reference range applies only to samples taken after fasting for at least 8 hours.  Blood gas, arterial     Status: Abnormal   Collection Time: 08/07/20 12:03 AM  Result Value Ref Range   FIO2 28.00    pH, Arterial 7.502 (H) 7.350 - 7.450   pCO2 arterial 28.4 (L) 32.0 - 48.0 mmHg   pO2, Arterial 67.3 (L) 83.0 - 108.0 mmHg   Bicarbonate 22.1 20.0 - 28.0 mmol/L   Acid-base deficit 0.0 0.0 - 2.0 mmol/L   O2 Saturation 92.0 %   Patient temperature 98.6    Allens test (pass/fail) PASS PASS    Comment: Performed at [facility], [place] [address] [place],  [address]  MRSA Next Gen by PCR, Nasal     Status: None   Collection Time: 08/07/20  3:31 AM   Specimen: Nasal Mucosa;  Nasal Swab  Result Value Ref Range   MRSA by PCR Next Gen NOT DETECTED NOT DETECTED    Comment: (NOTE) The GeneXpert MRSA Assay (FDA approved for NASAL specimens only), is one component of a comprehensive MRSA colonization surveillance program. It is not intended to diagnose MRSA infection nor to guide or monitor treatment for MRSA infections. Test performance is not  FDA approved in patients less than 71 years old. Performed at [facility], [place] [address] [place], [place] [address]   Basic metabolic panel     Status: Abnormal   Collection Time: 08/07/20  4:57 AM  Result Value Ref Range   Sodium 141 135 - 145 mmol/L   Potassium 4.2 3.5 - 5.1 mmol/L    Comment: DELTA CHECK NOTED NO VISIBLE HEMOLYSIS    Chloride 106 98 - 111 mmol/L   CO2 26 22 - 32 mmol/L   Glucose, Bld 127 (H) 70 - 99 mg/dL    Comment: Glucose reference range applies only to samples taken after fasting for at least 8 hours.   BUN 19 8 - 23 mg/dL   Creatinine, Ser 0.69 0.44 - 1.00 mg/dL   Calcium 9.0 8.9 - 10.3 mg/dL   GFR, Estimated >60 >60 mL/min    Comment: (NOTE) Calculated using the CKD-EPI Creatinine Equation (2021)    Anion gap 9 5 - 15    Comment: Performed at [facility], [place] [address] [place], [place] [address]  CBC     Status: Abnormal   Collection Time: 08/07/20  4:57 AM  Result Value Ref Range   WBC 12.2 (H) 4.0 - 10.5 K/uL   RBC 3.80 (L) 3.87 - 5.11 MIL/uL   Hemoglobin 10.8 (L) 12.0 - 15.0 g/dL   HCT 35.8 (L) 36.0 - 46.0 %   MCV 94.2 80.0 - 100.0 fL   MCH 28.4 26.0 - 34.0 pg   MCHC 30.2 30.0 - 36.0 g/dL   RDW 17.5 (H) 11.5 - 15.5 %   Platelets 395 150 - 400 K/uL   nRBC 0.0 0.0 - 0.2 %    Comment: Performed at [facility], [place] [address] [place], [place] [address]  Magnesium     Status: None   Collection Time: 08/07/20  4:57 AM  Result Value Ref Range   Magnesium 2.0 1.7 - 2.4 mg/dL    Comment: Performed at [facility], [place] [address], [place], [place] [address]  Brain natriuretic peptide     Status: Abnormal   Collection Time: 08/07/20  4:57 AM  Result Value Ref Range   B Natriuretic Peptide 2,411.8 (H) 0.0 - 100.0 pg/mL    Comment: Performed at [facility], [place] [address] [place], [place] [address]  Phosphorus     Status: None   Collection Time: 08/07/20  4:57 AM  Result Value Ref Range   Phosphorus 2.7 2.5 - 4.6 mg/dL    Comment: Performed at [facility], [place] [address] [place], [place] [address]    Imaging / Studies: DG CHEST PORT 1 VIEW  Result Date: 08/07/2020 CLINICAL DATA:  Shortness of breath EXAM: PORTABLE CHEST 1 VIEW COMPARISON:  03/31/2020 FINDINGS: Cardiac shadow is within normal limits. Aortic calcifications are seen. Increased vascular congestion is noted with bilateral pleural effusions left greater than right and mild interstitial edema consistent with CHF. No bony abnormality is noted. IMPRESSION: Changes of CHF with bilateral effusions and pulmonary edema. Electronically Signed   By: Inez Catalina M.D.   On: 08/07/2020 00:22    Medications / Allergies: per chart  Antibiotics: Anti-infectives (From admission, onward)    Start     Dose/Rate Route Frequency Ordered Stop   08/04/20 1400  neomycin (MYCIFRADIN) tablet 1,000 mg  Status:  Discontinued       See Hyperspace for full Linked Orders Report.  1,000 mg Oral 3 times per day 08/04/20 1043 08/04/20 1048   08/04/20 1400  metroNIDAZOLE (FLAGYL) tablet 1,000 mg  Status:  Discontinued       See Hyperspace for full Linked Orders Report.   1,000 mg Oral 3 times per day 08/04/20 1043 08/04/20 1048   08/04/20 0600  cefoTEtan (CEFOTAN) 2 g in sodium chloride 0.9 % 100 mL IVPB        2 g 200 mL/hr over 30 Minutes Intravenous On call to O.R. 08/03/20 0743 08/04/20 1256         Note: Portions of this report may have been transcribed using voice recognition software. Every  effort was made to ensure accuracy; however, inadvertent computerized transcription errors may be present.   Any transcriptional errors that result from this process are unintentional.    Adin Hector, MD, FACS, MASCRS Esophageal, Gastrointestinal & Colorectal Surgery Robotic and Minimally Invasive Surgery  [facility] [address] [place], [place] [address] [phone] Fax [phone] Main/Paging  CONTACT INFORMATION: Weekday (9AM-5PM) concerns: Call [facility] main office at [phone] Weeknight (5PM-9AM) or Weekend/Holiday concerns: Check [email or web address] for General Surgery [facility] coverage (Please, do not use SecureChat as it is not reliable communication to operating surgeons for immediate patient care)      08/07/2020  7:49 AM

## 2020-08-07 NOTE — Progress Notes (Signed)
Able to contact and discussed with Dr. Marylyn Ishihara with the medicine hospitalist consulting service.  He and his team will see and help follow with [name].

## 2020-08-07 NOTE — Progress Notes (Addendum)
   08/07/20 0100  Assess: MEWS Score  BP 140/73  Pulse Rate (!) 111  Resp (!) 27  Level of Consciousness Alert  SpO2 96 %  O2 Device Nasal Cannula  O2 Flow Rate (L/min) 2 L/min  Assess: MEWS Score  MEWS Temp 0  MEWS Systolic 0  MEWS Pulse 2  MEWS RR 2  MEWS LOC 0  MEWS Score 4  MEWS Score Color Red  Assess: SIRS CRITERIA  SIRS Temperature  0  SIRS Pulse 1  SIRS Respirations  1  SIRS WBC 0  SIRS Score Sum  2  Rapid Response Nurse at the Bedside. AC Lori at the bedside. Red MEWS noted. Covering MD Gross aware. See new orders.

## 2020-08-07 NOTE — Progress Notes (Signed)
Rapid Response Event Note   Reason for Call :  Pt SOB   Initial Focused Assessment:  Pt very HOH, but seems to be oriented to self.  Pt states "I can't breathe sit me up" Monitor reveals pt VS on 2 L [place] HR 121, sats 96%, RR 35, BP 154/73(94).  Pt bil breath sounds decreased with crackles in bases.  Abd surgery drsg intact.  Pt denies pain.  Pt has already received hydralazine 10mg  IV for increased HR prior to my arrival.  Interventions: Cbg 135.  EKG complete.  ABG and PCXR completed per RRT protocol.  [facility], MD on call (S.Gross) informed and made aware of pt current status.  New orders received and initiated.  See MAR and Flowsheet.    Plan of Care:  Pt to stay in current room, primary RN to continue to monitor pt and report.  Pt VSS.  Pt received Lasix 40mg  IV and says "I feel better now."  Event Summary:   MD Notified: Yes (S.Gross) Call Time: 2231 Arrival Time: [place]  Dyann Ruddle, RN

## 2020-08-07 NOTE — Progress Notes (Signed)
Date and time results received: 08/07/20 1735 (use smartphrase ".now" to insert current time)  Test: Troponin Critical Value: 149  Name of Provider Notified: Marylyn Ishihara MD  Orders Received? Or Actions Taken?:

## 2020-08-07 NOTE — Progress Notes (Addendum)
Report given to Amanda(Mandy).   Potassium Bag #2 infusing currently(see Mar). Belongings: Clothes, Purse, Museum/gallery conservator, Books and Both Hearing Aids and Games developer transferred with Patient to room 1230. Paper Chart along with EKG print out hand delivered to RRN .   AC Lori updated concerning change to SDU bed.

## 2020-08-08 ENCOUNTER — Inpatient Hospital Stay (HOSPITAL_COMMUNITY): Payer: Medicare Other

## 2020-08-08 DIAGNOSIS — I5031 Acute diastolic (congestive) heart failure: Secondary | ICD-10-CM

## 2020-08-08 LAB — BASIC METABOLIC PANEL
Anion gap: 11 (ref 5–15)
BUN: 20 mg/dL (ref 8–23)
CO2: 26 mmol/L (ref 22–32)
Calcium: 9.2 mg/dL (ref 8.9–10.3)
Chloride: 104 mmol/L (ref 98–111)
Creatinine, Ser: 0.66 mg/dL (ref 0.44–1.00)
GFR, Estimated: >60 mL/min (ref >60)
Glucose, Bld: 113 mg/dL — ABNORMAL HIGH (ref 70–99)
Potassium: 3.6 mmol/L (ref 3.5–5.1)
Sodium: 141 mmol/L (ref 135–145)

## 2020-08-08 LAB — ECHOCARDIOGRAM COMPLETE
Calc EF: 47.4 %
Height: 63 in
MV M vel: 5.06 m/s
MV Peak grad: 102.4 mmHg
P 1/2 time: 639 msec
Radius: 0.3 cm
S' Lateral: 3.2 cm
Single Plane A2C EF: 45.7 %
Single Plane A4C EF: 48.6 %
Weight: 1820.12 oz

## 2020-08-08 MED ORDER — DILTIAZEM HCL 60 MG PO TABS
60.0000 mg | ORAL_TABLET | Freq: Four times a day (QID) | ORAL | Status: DC
  Administered 2020-08-08: 60 mg via ORAL
  Filled 2020-08-08: qty 1

## 2020-08-08 MED ORDER — DILTIAZEM HCL 60 MG PO TABS: 60.0000 mg | ORAL_TABLET | Freq: Four times a day (QID) | ORAL | Status: DC

## 2020-08-08 MED ORDER — LOSARTAN POTASSIUM 50 MG PO TABS
25.0000 mg | ORAL_TABLET | Freq: Every day | ORAL | Status: DC
  Administered 2020-08-08 – 2020-08-16 (×9): 25 mg via ORAL
  Filled 2020-08-08 (×9): qty 1

## 2020-08-08 MED ORDER — RIVAROXABAN 15 MG PO TABS
15.0000 mg | ORAL_TABLET | Freq: Every day | ORAL | Status: DC
  Administered 2020-08-08 – 2020-08-16 (×9): 15 mg via ORAL
  Filled 2020-08-08 (×9): qty 1

## 2020-08-08 MED ORDER — AMIODARONE HCL 200 MG PO TABS
100.0000 mg | ORAL_TABLET | Freq: Once | ORAL | Status: AC
  Administered 2020-08-08: 100 mg via ORAL
  Filled 2020-08-08: qty 1

## 2020-08-08 MED ORDER — RIVAROXABAN 20 MG PO TABS: 20.0000 mg | ORAL_TABLET | Freq: Every day | ORAL | Status: DC

## 2020-08-08 MED ORDER — FUROSEMIDE 10 MG/ML IJ SOLN
40.0000 mg | Freq: Every day | INTRAMUSCULAR | Status: DC
  Administered 2020-08-08 – 2020-08-10 (×3): 40 mg via INTRAVENOUS
  Filled 2020-08-08 (×3): qty 4

## 2020-08-08 NOTE — Progress Notes (Signed)
Pt's HR in the 110's-140's, rhythm atrial fib. [facility] hospitalist notified and new orders placed. Will continue to monitor.

## 2020-08-08 NOTE — TOC Progression Note (Addendum)
Transition of Care [facility]) - Progression Note    Patient Details  Name: [name] [name] MRN: [id number] Date of Birth: 02/20/1929  [facility] CM/SW Contact  Adelie Croswell, Juliann Pulse, RN Phone Number: 08/08/2020, 3:11 PM  Clinical Narrative: Faxed out per Casey Burkitt permission-await bed offers. Has gone to [facility] in past-preferred-noted on [place].       Barriers to Discharge: Continued Medical Work up  Expected Discharge Plan and Services Expected Discharge Plan: [facility] (vs. Home with home health) In-house Referral: Clinical Social Work     Living arrangements for the past 2 months: Single Family Home                                       Social Determinants of Health (SDOH) Interventions    Readmission Risk Interventions No flowsheet data found.

## 2020-08-08 NOTE — Progress Notes (Signed)
Patient converted to sinus rhythm and heart rate is controlled.  She is asymptomatic now.  No evidence of fluid overload today. Echocardiogram with ejection fraction 30 to 35%.  Normal ejection fraction on 03/2020. Case discussed with cardiology.  Given patient's advanced frailty and postop status, will treat conservatively and referred to rehab.  Plan: Continue IV Lasix today along with potassium supplement.  Anticipate changing to oral Lasix tomorrow. Patient is allergic to beta-blockers, unable to start. Will start patient on losartan. Converted to sinus rhythm.  With low ejection fraction we will discontinue Cardizem, if recurrence of rapid A. fib, will increase dose of amiodarone to 400 mg for 1 week. Discussed with surgical team, okay to resume Xarelto.

## 2020-08-08 NOTE — Progress Notes (Signed)
Occupational Therapy Treatment Patient Details Name: [name] [name] MRN: [id number] DOB: [birth year] Today's Date: 08/08/2020    History of present illness Patient is an 86 year old female s/p R hemi colectomy due to colon CA. PMH includes HTN, HLD, afib, hearing loss   OT comments  Patient was noted to be in less pain on this date.patient was noted to have had A fib episode earlier this AM with nursing clearing patient later this AM for session with HR controlled.  Patient was min A for supine to sit on edge of bed with HOB raised. Patient required min A for sit to stand with noted urinary incontinence. Patient was noted to have some discomfort in abdominal area when sitting up on edge of bed.Patient reported being unable to tell if she needed to go to bathroom or not. Patient was then noted to report feeling urge to go but unable to tell if BM or urine. Patient required multiple clean ups on this date with goo standing balance with BUE support on rolling walker. Patient continues to make progress towards goals at this time. Recommendations for d/c remain appropriate.   Follow Up Recommendations  Home health OT;Supervision/Assistance - 24 hour;Other (comment)    Equipment Recommendations  3 in 1 bedside commode    Recommendations for Other Services      Precautions / Restrictions Precautions Precautions: Fall Precaution Comments: abd sx, deaf left ear, hearing aide right ear       Mobility Bed Mobility Overal bed mobility: Needs Assistance Bed Mobility: Rolling;Sidelying to Sit Rolling: Min assist Sidelying to sit: Min assist       General bed mobility comments: with head of bed raised.    Transfers Overall transfer level: Needs assistance Equipment used: Rolling walker (2 wheeled) Transfers: Sit to/from Stand Sit to Stand: Min assist         General transfer comment: cues for hand placement, assist to rise and steady    Balance                                            ADL either performed or assessed with clinical judgement   ADL Overall ADL's : Needs assistance/impaired                         Toilet Transfer: Minimal assistance;Cueing for safety;Cueing for sequencing;Ambulation;RW;Regular Toilet   Toileting- Water quality scientist and Hygiene: Moderate assistance;Sit to/from stand Toileting - Clothing Manipulation Details (indicate cue type and reason): patient made attempts to help with continued voiding during standing for hygiene tasks.     Functional mobility during ADLs: Minimal assistance;Cueing for safety;Cueing for sequencing;Rolling walker       Vision       Perception     Praxis      Cognition Arousal/Alertness: Awake/alert Behavior During Therapy: WFL for tasks assessed/performed Overall Cognitive Status: Difficult to assess                                 General Comments: WFL for questions asked, pt very HOH, talked into right ear         Prior Functioning/Environment              Frequency  Min 2X/week        Progress Toward Goals  OT Goals(current goals can now be found in the care plan section)  Progress towards OT goals: Progressing toward goals  Acute Rehab OT Goals Patient Stated Goal: to sit further back in recliner OT Goal Formulation: With patient Time For Goal Achievement: 08/19/20 Potential to Achieve Goals: Good  Plan Discharge plan remains appropriate    Co-evaluation    PT/OT/SLP Co-Evaluation/Treatment: Yes Reason for Co-Treatment: For patient/therapist safety PT goals addressed during session: Mobility/safety with mobility OT goals addressed during session: ADL's and self-care      AM-PAC OT "6 Clicks" Daily Activity     Outcome Measure   Help from another person eating meals?: None Help from another person taking care of personal grooming?: A Little Help from another person toileting, which includes using toliet, bedpan, or  urinal?: A Lot Help from another person bathing (including washing, rinsing, drying)?: A Lot Help from another person to put on and taking off regular upper body clothing?: A Little Help from another person to put on and taking off regular lower body clothing?: A Lot 6 Click Score: 16    End of Session Equipment Utilized During Treatment: Rolling walker;Gait belt  OT Visit Diagnosis: Unsteadiness on feet (R26.81);Other abnormalities of gait and mobility (R26.89);Muscle weakness (generalized) (M62.81)   Activity Tolerance Patient tolerated treatment well   Patient Left in chair;with call bell/phone within reach;with chair alarm set   Nurse Communication Other (comment) (nurse cleared patient to participate in session)        Time: 1130-1157 OT Time Calculation (min): 27 min  Charges: OT General Charges $OT Visit: 1 Visit OT Treatments $Self Care/Home Management : 8-22 mins  Jackelyn Poling OTR/L, [place] Acute Rehabilitation Department Office# [phone] Pager# [phone]    [place] 08/08/2020, 12:10 PM

## 2020-08-08 NOTE — Progress Notes (Signed)
Pt converted back into NSR

## 2020-08-08 NOTE — Progress Notes (Signed)
Subjective Transferred to SDU and medicine consulted for possible CHF/exacerbation, afib/rvr. She feels well this morning, no n/v. Tolerating diet without issue and having flatus and Bms. Denies abdominal pain. Denies chest pain or SOB at this point.  Objective: Vital signs in last 24 hours: Temp:  [97.3 F (36.3 C)-98.6 F (37 C)] 97.6 F (36.4 C) (07/11 0303) Pulse Rate:  [82-134] 131 (07/11 0800) Resp:  [17-27] 21 (07/11 0800) BP: (99-180)/(46-130) 136/104 (07/11 0800) SpO2:  [91 %-100 %] 95 % (07/11 0800) Weight:  [51.6 kg] 51.6 kg (07/11 0303) Last BM Date: 08/08/20  Intake/Output from previous day: 07/10 0701 - 07/11 0700 In: 77.1 [IV Piggyback:77.1] Out: 2500 [Urine:2500] Intake/Output this shift: No intake/output data recorded.  Gen: NAD, comfortable CV: RRR Pulm: Normal work of breathing Abd: Soft, NT/ND. Incisions c/d/i without erythema or drainage. No palpable hernias Ext: SCDs in place  Lab Results: CBC  Recent Labs    08/06/20 0404 08/07/20 0457  WBC 9.1 12.2*  HGB 8.9* 10.8*  HCT 29.8* 35.8*  PLT 289 395   BMET Recent Labs    08/06/20 0404 08/07/20 0457  NA 138 141  K 2.8* 4.2  CL 103 106  CO2 28 26  GLUCOSE 82 127*  BUN 18 19  CREATININE 0.72 0.69  CALCIUM 8.7* 9.0   PT/INR No results for input(s): LABPROT, INR in the last 72 hours. ABG Recent Labs    08/07/20 0003  PHART 7.502*  HCO3 22.1    Studies/Results:  Anti-infectives: Anti-infectives (From admission, onward)    Start     Dose/Rate Route Frequency Ordered Stop   08/04/20 1400  neomycin (MYCIFRADIN) tablet 1,000 mg  Status:  Discontinued       See Hyperspace for full Linked Orders Report.   1,000 mg Oral 3 times per day 08/04/20 1043 08/04/20 1048   08/04/20 1400  metroNIDAZOLE (FLAGYL) tablet 1,000 mg  Status:  Discontinued       See Hyperspace for full Linked Orders Report.   1,000 mg Oral 3 times per day 08/04/20 1043 08/04/20 1048   08/04/20 0600  cefoTEtan  (CEFOTAN) 2 g in sodium chloride 0.9 % 100 mL IVPB        2 g 200 mL/hr over 30 Minutes Intravenous On call to O.R. 08/03/20 0743 08/04/20 1256        Assessment/Plan: Patient Active Problem List   Diagnosis Date Noted   Pressure injury of skin 08/07/2020   Confusion, postoperative 08/07/2020   Acute pulmonary edema ([place]) 08/07/2020   Diverticular disease 08/07/2020   GERD (gastroesophageal reflux disease) 08/07/2020   History of Bell's palsy 08/07/2020   Mitral regurgitation 08/07/2020   Cancer of ascending colon s/p lap colectomy 08/04/2020 06/01/2020   Colonic mass    Closed intertrochanteric fracture ([place]) 04/12/2020   Colles' fracture 04/12/2020   Osteoarthritis of knee 04/12/2020   Symptomatic anemia 03/25/2020   HLD (hyperlipidemia) 03/25/2020   Bilateral leg edema 02/04/2020   Rapid atrial fibrillation ([place]) 01/11/2020   Chronic anticoagulation 01/11/2020   Chronic ulcer of left leg ([place]) 01/11/2020   Iron deficiency anemia 01/11/2020   Hypokalemia 01/01/2020   Left leg cellulitis 01/01/2020   Iron deficiency anemia due to chronic blood loss 01/01/2020   Wound cellulitis 12/31/2019   Aortic atherosclerosis ([place]) 09/25/2019   HOH (hard of hearing) 08/31/2019   History of pelvic fracture 08/31/2019   Pelvic fracture ([place]) 08/25/2019   Permanent atrial fibrillation ([place]) 08/25/2019   Pain    Dehydration  Accidental fall 07/26/2019   UTI (urinary tract infection) 07/26/2019   Closed fracture of right inferior pubic ramus ([place]) 07/26/2019   HTN (hypertension) 07/26/2019   Ambulatory dysfunction 07/26/2019   Pubic bone fracture ([place]) 07/26/2019   History of colon polyps 06/25/2017   Constipation, chronic 07/27/2013   Osteopenia 07/27/2013   s/p Procedure(s): LAPAROSCOPIC RIGHT HEMI COLECTOMY 08/04/2020  -Greatly appreciate medicine's assistance and involvement in her care; echo completed while I was at bedside; noted plans for lasix daily -Clinically she is  doing reasonably well -Continue soft diet as tolerated -Working with therapies -Ppx: SQH, SCDs; on BID PPI currently -Dispo: SNF as per PT/OT recommendation   LOS: 4 days   Nadeen Landau, MD [facility] Use [email or web address] to contact on call provider

## 2020-08-08 NOTE — Progress Notes (Signed)
Physical Therapy Treatment Patient Details Name: [name] [name] MRN: [id number] DOB: 03/18/1929 Today's Date: 08/08/2020    History of Present Illness Patient is an 87 year old female s/p R hemi colectomy due to colon CA. PMH includes HTN, HLD, afib, hearing loss    PT Comments    The patient reports abdomen pain when mobilizing. Patient  with incontinence of B/B throughout mobility. HR up briefly 112, Spo2 >90's.Continue PT for mobility.  Follow Up Recommendations  SNF     Equipment Recommendations  None recommended by PT    Recommendations for Other Services       Precautions / Restrictions Precautions Precautions: Fall Precaution Comments: abd sx, deaf left ear, hearing aide right ear    Mobility  Bed Mobility Overal bed mobility: Needs Assistance Bed Mobility: Rolling;Sidelying to Sit Rolling: Min assist Sidelying to sit: Min assist       General bed mobility comments: with head of bed raised.    Transfers Overall transfer level: Needs assistance Equipment used: Rolling walker (2 wheeled) Transfers: Sit to/from Stand Sit to Stand: Min assist         General transfer comment: cues for hand placement, assist to rise and steady used RW.  Ambulation/Gait                 Stairs             Wheelchair Mobility    Modified Rankin (Stroke Patients Only)       Balance Overall balance assessment: Needs assistance Sitting-balance support: No upper extremity supported;Feet supported Sitting balance-Leahy Scale: Fair     Standing balance support: Bilateral upper extremity supported Standing balance-Leahy Scale: Poor Standing balance comment: reliant on UE support and min A                            Cognition Arousal/Alertness: Awake/alert Behavior During Therapy: WFL for tasks assessed/performed Overall Cognitive Status: Difficult to assess                                 General Comments: WFL for  questions asked, pt very HOH, talked into right ear      Exercises      General Comments        Pertinent Vitals/Pain Faces Pain Scale: Hurts whole lot Pain Location: abdomen Pain Descriptors / Indicators: Sore;Grimacing;Discomfort;Guarding Pain Intervention(s): Monitored during session    Home Living                      Prior Function            PT Goals (current goals can now be found in the care plan section) Acute Rehab PT Goals Patient Stated Goal: to sit further back in recliner Progress towards PT goals: Progressing toward goals    Frequency    Min 2X/week      PT Plan Current plan remains appropriate    Co-evaluation PT/OT/SLP Co-Evaluation/Treatment: Yes Reason for Co-Treatment: For patient/therapist safety PT goals addressed during session: Mobility/safety with mobility OT goals addressed during session: ADL's and self-care      AM-PAC PT "6 Clicks" Mobility   Outcome Measure  Help needed turning from your back to your side while in a flat bed without using bedrails?: A Lot Help needed moving from lying on your back to sitting on the side of a flat  bed without using bedrails?: A Lot Help needed moving to and from a bed to a chair (including a wheelchair)?: A Lot Help needed standing up from a chair using your arms (e.g., wheelchair or bedside chair)?: A Lot Help needed to walk in hospital room?: A Lot Help needed climbing 3-5 steps with a railing? : Total 6 Click Score: 11    End of Session   Activity Tolerance: Patient limited by fatigue;Patient limited by pain Patient left: in chair;with call bell/phone within reach;with chair alarm set Nurse Communication: Mobility status PT Visit Diagnosis: Other abnormalities of gait and mobility (R26.89)     Time: 6384-5364 PT Time Calculation (min) (ACUTE ONLY): 25 min  Charges:  $Therapeutic Activity: 8-22 mins                     [place] Pager  [phone] Office [phone]    Claretha Cooper 08/08/2020, 1:38 PM

## 2020-08-08 NOTE — Progress Notes (Signed)
  Echocardiogram 2D Echocardiogram has been performed.  [name] [name] 08/08/2020, 8:44 AM

## 2020-08-08 NOTE — Progress Notes (Signed)
PROGRESS NOTE    [name] [name]  ZOX:[id number] DOB: 1930-01-14 DOA: 08/04/2020 PCP: [name], MD    Brief Narrative:  87 year old female with recently diagnosed colon cancer who underwent surgical resection on 7/7 and recovering.  Patient does have history of paroxysmal A. fib, hypertension, diastolic heart failure.  Postoperatively patient complained of chest discomfort and hypoxic episode.  She was found to be in and out of A. fib as well fluid overload so medicine consulted.   Assessment & Plan:   Principal Problem:   Cancer of ascending colon s/p lap colectomy 08/04/2020 Active Problems:   HTN (hypertension)   Permanent atrial fibrillation (HCC)   Iron deficiency anemia due to chronic blood loss   Chronic anticoagulation   Iron deficiency anemia   Constipation, chronic   HOH (hard of hearing)   Pressure injury of skin   Confusion, postoperative   Acute pulmonary edema (HCC)   GERD (gastroesophageal reflux disease)   Mitral regurgitation  Paroxysmal A. fib with RVR: Probably aggravated by postoperative events and fluid imbalance.  Patient overnight developed RVR.  Blood pressure stable.  Fairly controlled symptoms. -Was on amiodarone 200 mg daily at home, resumed 7/10.   -She is allergic to metoprolol, will start patient on a scheduled diltiazem 60 mg every 6 hours.  If no adequate control with oral medications, will start Cardizem infusion.  Trying to avoid chemical conversion to normal sinus rhythm as she is not on anticoagulation. -Twelve-lead EKG performed at bedside and reviewed shows rapid A. fib. -2D echocardiogram was ordered, recent echocardiogram with normal ejection fraction. -Patient is currently without anticoagulation due to colon mass.  Chest pain and hypoxia: Mildly elevated troponins.  Exacerbation of diastolic heart failure. -Chest discomfort is probably due to RVR that has improved now. -Mildly elevated flat troponins consistent with  tachycardia.  Unlikely acute coronary syndrome.  Currently chest pain-free. -Clinical and chest x-ray evidence of diastolic heart failure exacerbation, 2D echocardiogram pending. Patient is fairly euvolemic today, decrease Lasix to 40 mg IV daily along with potassium supplement.  Mobilize.  Hypertension: Blood pressures fairly stable.  Discontinue verapamil as starting diltiazem for rate control.  Colon adenocarcinoma: Status post surgical resection.  Plan as per surgery.  Thank you for involving [name] in this patient's care.  I will continue to follow while she is in the hospital. If heart rate is difficult to control, will discuss case with her cardiologist.    DVT prophylaxis: heparin injection 5,000 Units Start: 08/05/20 0600 SCD's Start: 08/04/20 1600 Place TED hose Start: 08/04/20 1600   Code Status: Full code Family Communication: No family at bedside Disposition Plan: Status is: Inpatient  Remains inpatient appropriate because:Inpatient level of care appropriate due to severity of illness  Dispo: The patient is from: Home              Anticipated d/c is to: SNF              Patient currently is not medically stable to d/c.   Difficult to place patient No         Consultants:  Hospital medicine.  Surgery is primary.  Procedures:  Right hemicolectomy 7/7  Antimicrobials:  None   Subjective: Patient seen and examined.  Hard of hearing.  After much effort I was able to talk to her and she denies any complaints.  Tolerating regular diet. Overnight events noted.  Patient had been in and out of A. fib with RVR.  In my morning exam her  heart rate was 130-140 and patient denied any chest pain or palpitations. After giving dose of amiodarone and 60 mg of oral Cardizem however latest heart rate is 100-120.  Objective: Vitals:   08/08/20 0700 08/08/20 0800 08/08/20 0847 08/08/20 0900  BP: (!) 159/130 (!) 136/104 (!) 136/104 (!) 130/92  Pulse: (!) 123 (!) 131  76  Resp:  19 (!) 21  20  Temp:  98.2 F (36.8 C)    TempSrc:  Oral    SpO2: 93% 95%  95%  Weight:      Height:        Intake/Output Summary (Last 24 hours) at 08/08/2020 1139 Last data filed at 08/08/2020 0931 Gross per 24 hour  Intake 240 ml  Output 2500 ml  Net -2260 ml   Filed Weights   08/06/20 0545 08/07/20 0355 08/08/20 0303  Weight: 56.4 kg 53.9 kg 51.6 kg    Examination:  General exam: Appears calm and comfortable  Chronically sick looking and debilitated frail lady laying in bed.  Not in distress.  On room air today. Respiratory system: Mostly bilateral clear.  She has poor air entry at both bases.  Unable to hear any crackles. Cardiovascular system: S1 & S2 heard, tachycardic.  Irregularly irregular.  No edema. Gastrointestinal system: Soft.  Bowel sounds present.  She has midline surgical incision which looks clean and dry. Central nervous system: Alert and oriented. No focal neurological deficits. Has flat affect.  Hard of hearing. 35 minutes  Data Reviewed: I have personally reviewed following labs and imaging studies  CBC: Recent Labs  Lab 08/05/20 0425 08/06/20 0404 08/07/20 0457  WBC 12.5* 9.1 12.2*  HGB 9.1* 8.9* 10.8*  HCT 29.4* 29.8* 35.8*  MCV 92.5 95.5 94.2  PLT 217 289 161   Basic Metabolic Panel: Recent Labs  Lab 08/05/20 0425 08/06/20 0404 08/07/20 0457 08/08/20 0838  NA 139 138 141 141  K 3.2* 2.8* 4.2 3.6  CL 105 103 106 104  CO2 24 28 26 26   GLUCOSE 109* 82 127* 113*  BUN 12 18 19 20   CREATININE 0.79 0.72 0.69 0.66  CALCIUM 8.7* 8.7* 9.0 9.2  MG  --   --  2.0  --   PHOS  --   --  2.7  --    GFR: Estimated Creatinine Clearance: 38.8 mL/min (by C-G formula based on SCr of 0.66 mg/dL). Liver Function Tests: No results for input(s): AST, ALT, ALKPHOS, BILITOT, PROT, ALBUMIN in the last 168 hours. No results for input(s): LIPASE, AMYLASE in the last 168 hours. No results for input(s): AMMONIA in the last 168 hours. Coagulation  Profile: No results for input(s): INR, PROTIME in the last 168 hours. Cardiac Enzymes: No results for input(s): CKTOTAL, CKMB, CKMBINDEX, TROPONINI in the last 168 hours. BNP (last 3 results) No results for input(s): PROBNP in the last 8760 hours. HbA1C: No results for input(s): HGBA1C in the last 72 hours. CBG: Recent Labs  Lab 08/06/20 2350  GLUCAP 135*   Lipid Profile: No results for input(s): CHOL, HDL, LDLCALC, TRIG, CHOLHDL, LDLDIRECT in the last 72 hours. Thyroid Function Tests: No results for input(s): TSH, T4TOTAL, FREET4, T3FREE, THYROIDAB in the last 72 hours. Anemia Panel: No results for input(s): VITAMINB12, FOLATE, FERRITIN, TIBC, IRON, RETICCTPCT in the last 72 hours. Sepsis Labs: No results for input(s): PROCALCITON, LATICACIDVEN in the last 168 hours.  Recent Results (from the past 240 hour(s))  SARS CORONAVIRUS 2 (TAT 6-24 HRS) Nasopharyngeal Nasopharyngeal Swab  Status: None   Collection Time: 08/02/20  1:23 PM   Specimen: Nasopharyngeal Swab  Result Value Ref Range Status   SARS Coronavirus 2 NEGATIVE NEGATIVE Final    Comment: (NOTE) SARS-CoV-2 target nucleic acids are NOT DETECTED.  The SARS-CoV-2 RNA is generally detectable in upper and lower respiratory specimens during the acute phase of infection. Negative results do not preclude SARS-CoV-2 infection, do not rule out co-infections with other pathogens, and should not be used as the sole basis for treatment or other patient management decisions. Negative results must be combined with clinical observations, patient history, and epidemiological information. The expected result is Negative.  Fact Sheet for Patients: SugarRoll.be  Fact Sheet for Healthcare Providers: https://www.woods-mathews.com/  This test is not yet approved or cleared by the [place] FDA and  has been authorized for detection and/or diagnosis of SARS-CoV-2 by FDA under an  Emergency Use Authorization (EUA). This EUA will remain  in effect (meaning this test can be used) for the duration of the COVID-19 declaration under Se ction 564(b)(1) of the Act, 21 U.S.C. section 360bbb-3(b)(1), unless the authorization is terminated or revoked sooner.  Performed at [facility], [place] [address] [place], [place] [address]   MRSA Next Gen by PCR, Nasal     Status: None   Collection Time: 08/07/20  3:31 AM   Specimen: Nasal Mucosa; Nasal Swab  Result Value Ref Range Status   MRSA by PCR Next Gen NOT DETECTED NOT DETECTED Final    Comment: (NOTE) The GeneXpert MRSA Assay (FDA approved for NASAL specimens only), is one component of a comprehensive MRSA colonization surveillance program. It is not intended to diagnose MRSA infection nor to guide or monitor treatment for MRSA infections. Test performance is not FDA approved in patients less than 78 years old. Performed at [facility], [place] [address] [place], [place] [address]          Radiology Studies: DG CHEST PORT 1 VIEW  Result Date: 08/07/2020 CLINICAL DATA:  Shortness of breath EXAM: PORTABLE CHEST 1 VIEW COMPARISON:  03/31/2020 FINDINGS: Cardiac shadow is within normal limits. Aortic calcifications are seen. Increased vascular congestion is noted with bilateral pleural effusions left greater than right and mild interstitial edema consistent with CHF. No bony abnormality is noted. IMPRESSION: Changes of CHF with bilateral effusions and pulmonary edema. Electronically Signed   By: Inez Catalina M.D.   On: 08/07/2020 00:22        Scheduled Meds:  acetaminophen  1,000 mg Oral Q6H   amiodarone  200 mg Oral Daily   Chlorhexidine Gluconate Cloth  6 each Topical Q0600   diltiazem  60 mg Oral Q6H   feeding supplement  237 mL Oral BID BM   fluticasone  1 spray Each Nare Daily   furosemide  40 mg Intravenous Daily   heparin injection (subcutaneous)  5,000 Units Subcutaneous Q8H   iron  polysaccharides  150 mg Oral BID   lip balm  1 application Topical BID   multivitamin with minerals  1 tablet Oral Daily   pantoprazole  40 mg Oral BID AC   polycarbophil  625 mg Oral BID   potassium chloride  40 mEq Oral Daily   Continuous Infusions:   LOS: 4 days   Time spent: 35 minutes    Barb Merino, MD [facility] Pager [phone]

## 2020-08-08 NOTE — Progress Notes (Signed)
Pt converted back to atrial fib rhythm, sustaining in the 120's-140's. [facility] hospitalist notified. Pt asymptomatic. Will continue to monitor.

## 2020-08-08 NOTE — Progress Notes (Signed)
Pt's HR remains in the 120's-130's. Cardizem push was effective but HR came back up shortly thereafter. [facility] hospitalist notified and new order for 100mg  PO amiodarone placed.  Will administer as ordered and continue to monitor closely for changes.

## 2020-08-08 NOTE — [place] FL2 (Signed)
[place] MEDICAID FL2 LEVEL OF CARE SCREENING TOOL     IDENTIFICATION  Patient Name: [name] [name] Birthdate: 1929-11-10 Sex: female Admission Date (Current Location): 08/04/2020  [facility] [place] Number:  Herbalist and Address:  [facility],  [place], [place]      Provider Number: [id number]  Attending Physician Name and Address:  Barb Merino, MD  Relative Name and Phone Number:  Percell Miller Dimmick(Son) [phone]    Current Level of Care: Hospital Recommended Level of Care: [place] Prior Approval Number:    Date Approved/Denied:   PASRR Number: [id number]  Discharge Plan: SNF    Current Diagnoses: Patient Active Problem List   Diagnosis Date Noted   Pressure injury of skin 08/07/2020   Confusion, postoperative 08/07/2020   Acute pulmonary edema ([place]) 08/07/2020   Diverticular disease 08/07/2020   GERD (gastroesophageal reflux disease) 08/07/2020   History of Bell's palsy 08/07/2020   Mitral regurgitation 08/07/2020   Cancer of ascending colon s/p lap colectomy 08/04/2020 06/01/2020   Colonic mass    Closed intertrochanteric fracture ([place]) 04/12/2020   Colles' fracture 04/12/2020   Osteoarthritis of knee 04/12/2020   Symptomatic anemia 03/25/2020   HLD (hyperlipidemia) 03/25/2020   Bilateral leg edema 02/04/2020   Rapid atrial fibrillation ([place]) 01/11/2020   Chronic anticoagulation 01/11/2020   Chronic ulcer of left leg ([place]) 01/11/2020   Iron deficiency anemia 01/11/2020   Hypokalemia 01/01/2020   Left leg cellulitis 01/01/2020   Iron deficiency anemia due to chronic blood loss 01/01/2020   Wound cellulitis 12/31/2019   Aortic atherosclerosis ([place]) 09/25/2019   HOH (hard of hearing) 08/31/2019   History of pelvic fracture 08/31/2019   Pelvic fracture ([place]) 08/25/2019   Permanent atrial fibrillation ([place]) 08/25/2019   Pain    Dehydration    Accidental fall 07/26/2019   UTI (urinary  tract infection) 07/26/2019   Closed fracture of right inferior pubic ramus ([place]) 07/26/2019   HTN (hypertension) 07/26/2019   Ambulatory dysfunction 07/26/2019   Pubic bone fracture ([place]) 07/26/2019   History of colon polyps 06/25/2017   Constipation, chronic 07/27/2013   Osteopenia 07/27/2013    Orientation RESPIRATION BLADDER Height & Weight     Self, Time, Situation, Place  O2 Continent Weight: 51.6 kg Height:  5\' 3"  (160 cm)  BEHAVIORAL SYMPTOMS/MOOD NEUROLOGICAL BOWEL NUTRITION STATUS      Continent Diet (Soft)  AMBULATORY STATUS COMMUNICATION OF NEEDS Skin   Limited Assist Verbally Surgical wounds (R hemi colectomy site.)                       Personal Care Assistance Level of Assistance  Bathing, Feeding, Dressing Bathing Assistance: Limited assistance Feeding assistance: Limited assistance Dressing Assistance: Limited assistance     Functional Limitations Info  Sight, Hearing, Speech Sight Info: Adequate Hearing Info: Impaired (Bilateral Hearing aids) Speech Info: Adequate    SPECIAL CARE FACTORS FREQUENCY  PT (By licensed PT), OT (By licensed OT)     PT Frequency:  (5x week) OT Frequency:  (5x week)            Contractures Contractures Info: Not present    Additional Factors Info  Code Status, Allergies Code Status Info:  (Full) Allergies Info:  (Metoprolol, Bactrim (Sulfamethoxazole-trimethoprim), Ciprofloxacin, Pneumococcal Vaccines)           Current Medications (08/08/2020):  This is the current hospital active medication list Current Facility-Administered Medications  Medication Dose Route  Frequency Provider Last Rate Last Admin   acetaminophen (TYLENOL) tablet 1,000 mg  1,000 mg Oral Q6H Michael Boston, MD   1,000 mg at 08/08/20 1157   alum & mag hydroxide-simeth (MAALOX/MYLANTA) 200-200-20 MG/5ML suspension 30 mL  30 mL Oral Q6H PRN Michael Boston, MD       amiodarone (PACERONE) tablet 200 mg  200 mg Oral Daily Michael Boston, MD   200 mg  at 08/08/20 0931   calcium carbonate (TUMS - dosed in mg elemental calcium) chewable tablet 500 mg  500 mg Oral Daily PRN Michael Boston, MD       Chlorhexidine Gluconate Cloth 2 % PADS 6 each  6 each Topical Q0600 [name], MD   6 each at 08/08/20 0555   diltiazem (CARDIZEM) tablet 60 mg  60 mg Oral Q6H Barb Merino, MD   60 mg at 08/08/20 0847   diphenhydrAMINE (BENADRYL) 12.5 MG/5ML elixir 12.5 mg  12.5 mg Oral Q6H PRN Michael Boston, MD       enalaprilat (VASOTEC) injection 0.625-1.25 mg  0.625-1.25 mg Intravenous Q6H PRN Michael Boston, MD       feeding supplement (ENSURE SURGERY) liquid 237 mL  237 mL Oral BID BM Michael Boston, MD   237 mL at 08/08/20 1409   fentaNYL (SUBLIMAZE) injection 12.5-25 mcg  12.5-25 mcg Intravenous Q2H PRN Michael Boston, MD   12.5 mcg at 08/08/20 0943   fluticasone (FLONASE) 50 MCG/ACT nasal spray 1 spray  1 spray Each Nare Daily Michael Boston, MD   1 spray at 08/08/20 0929   furosemide (LASIX) injection 40 mg  40 mg Intravenous Daily Barb Merino, MD   40 mg at 08/08/20 4098   iron polysaccharides (NIFEREX) capsule 150 mg  150 mg Oral BID Michael Boston, MD   150 mg at 08/08/20 1191   lip balm (CARMEX) ointment 1 application  1 application Topical BID Michael Boston, MD   1 application at 47/82/95 6213   magic mouthwash  15 mL Oral QID PRN Michael Boston, MD       menthol-cetylpyridinium (CEPACOL) lozenge 3 mg  1 lozenge Oral PRN Michael Boston, MD       multivitamin with minerals tablet 1 tablet  1 tablet Oral Daily Michael Boston, MD   1 tablet at 08/08/20 0927   ondansetron (ZOFRAN) tablet 4 mg  4 mg Oral Q6H PRN Michael Boston, MD       Or   ondansetron [facility]) injection 4 mg  4 mg Intravenous Q6H PRN Michael Boston, MD   4 mg at 08/04/20 1614   pantoprazole (PROTONIX) EC tablet 40 mg  40 mg Oral BID Rogelia Mire, MD   40 mg at 08/08/20 0744   phenol (CHLORASEPTIC) mouth spray 2 spray  2 spray Mouth/Throat PRN Michael Boston, MD       polycarbophil  (FIBERCON) tablet 625 mg  625 mg Oral BID Michael Boston, MD   625 mg at 08/08/20 0926   potassium chloride (KLOR-CON) packet 40 mEq  40 mEq Oral Daily Michael Boston, MD   40 mEq at 08/08/20 0865   Rivaroxaban (XARELTO) tablet 15 mg  15 mg Oral Q supper Barb Merino, MD       simethicone (MYLICON) chewable tablet 40 mg  40 mg Oral Q6H PRN Michael Boston, MD   40 mg at 08/05/20 1947   traMADol (ULTRAM) tablet 50 mg  50 mg Oral Q6H PRN Michael Boston, MD   50 mg at 08/07/20 0240  Discharge Medications: Please see discharge summary for a list of discharge medications.  Relevant Imaging Results:  Relevant Lab Results:   Additional Information SS#[id number] Juliann Pulse, [place]

## 2020-08-08 NOTE — Progress Notes (Signed)
Pharmacy Brief Note - Alvimopan (Entereg)  The standing order set for alvimopan (Entereg) now includes an automatic order to discontinue the drug after the patient has had a bowel movement. The change was approved by the [place] and the Medical Executive Committee.  This patient has had a bowel movement documented by nursing and surgeon. Therefore, alvimopan has been discontinued. If there are questions, please contact the pharmacy at [phone].  Thank you  Reuel Boom, PharmD, BCPS [phone] 08/08/2020, 7:45 AM

## 2020-08-09 ENCOUNTER — Other Ambulatory Visit (HOSPITAL_COMMUNITY): Payer: Self-pay

## 2020-08-09 LAB — BASIC METABOLIC PANEL
Anion gap: 10 (ref 5–15)
BUN: 30 mg/dL — ABNORMAL HIGH (ref 8–23)
CO2: 25 mmol/L (ref 22–32)
Calcium: 9.1 mg/dL (ref 8.9–10.3)
Chloride: 103 mmol/L (ref 98–111)
Creatinine, Ser: 0.77 mg/dL (ref 0.44–1.00)
GFR, Estimated: >60 mL/min (ref >60)
Glucose, Bld: 117 mg/dL — ABNORMAL HIGH (ref 70–99)
Potassium: 3.7 mmol/L (ref 3.5–5.1)
Sodium: 138 mmol/L (ref 135–145)

## 2020-08-09 LAB — CBC WITH DIFFERENTIAL/PLATELET
Abs Immature Granulocytes: 0.03 10*3/uL (ref 0.00–0.07)
Basophils Absolute: 0.1 10*3/uL (ref 0.0–0.1)
Basophils Relative: 1 %
Eosinophils Absolute: 0.5 10*3/uL (ref 0.0–0.5)
Eosinophils Relative: 4 %
HCT: 34.4 % — ABNORMAL LOW (ref 36.0–46.0)
Hemoglobin: 10.4 g/dL — ABNORMAL LOW (ref 12.0–15.0)
Immature Granulocytes: 0 %
Lymphocytes Relative: 18 %
Lymphs Abs: 2.1 10*3/uL (ref 0.7–4.0)
MCH: 28.3 pg (ref 26.0–34.0)
MCHC: 30.2 g/dL (ref 30.0–36.0)
MCV: 93.7 fL (ref 80.0–100.0)
Monocytes Absolute: 1.9 10*3/uL — ABNORMAL HIGH (ref 0.1–1.0)
Monocytes Relative: 16 %
Neutro Abs: 7.3 10*3/uL (ref 1.7–7.7)
Neutrophils Relative %: 61 %
Platelets: 377 10*3/uL (ref 150–400)
RBC: 3.67 MIL/uL — ABNORMAL LOW (ref 3.87–5.11)
RDW: 17.6 % — ABNORMAL HIGH (ref 11.5–15.5)
WBC: 11.7 10*3/uL — ABNORMAL HIGH (ref 4.0–10.5)
nRBC: 0 % (ref 0.0–0.2)

## 2020-08-09 LAB — SURGICAL PATHOLOGY

## 2020-08-09 MED ORDER — AMIODARONE HCL 200 MG PO TABS
200.0000 mg | ORAL_TABLET | Freq: Once | ORAL | Status: AC
  Filled 2020-08-09: qty 1

## 2020-08-09 MED ORDER — MELATONIN 3 MG PO TABS
3.0000 mg | ORAL_TABLET | Freq: Every day | ORAL | Status: DC
  Administered 2020-08-09 – 2020-08-15 (×7): 3 mg via ORAL
  Filled 2020-08-09 (×7): qty 1

## 2020-08-09 MED ORDER — AMIODARONE HCL 200 MG PO TABS
200.0000 mg | ORAL_TABLET | Freq: Two times a day (BID) | ORAL | Status: DC
  Administered 2020-08-09 – 2020-08-10 (×3): 200 mg via ORAL
  Filled 2020-08-09 (×3): qty 1

## 2020-08-09 MED ORDER — DILTIAZEM HCL 60 MG PO TABS
60.0000 mg | ORAL_TABLET | Freq: Three times a day (TID) | ORAL | Status: DC
  Administered 2020-08-09 – 2020-08-10 (×4): 60 mg via ORAL
  Filled 2020-08-09 (×4): qty 1

## 2020-08-09 NOTE — Progress Notes (Signed)
Subjective Remains in SDU and medicine following for CHF/exacerbation, afib/rvr. She feels well this morning, no n/v. Tolerating diet without issue and having flatus and Bms. Denies abdominal pain. Denies chest pain or SOB at this point.  Objective: Vital signs in last 24 hours: Temp:  [98 F (36.7 C)-98.7 F (37.1 C)] 98.1 F (36.7 C) (07/12 0803) Pulse Rate:  [85-143] 143 (07/12 0400) Resp:  [15-25] 16 (07/12 0400) BP: (114-146)/(54-95) 144/95 (07/12 0829) SpO2:  [94 %-97 %] 94 % (07/12 0400) Weight:  [53.6 kg] 53.6 kg (07/12 0605) Last BM Date: 08/08/20  Intake/Output from previous day: 07/11 0701 - 07/12 0700 In: 360 [P.O.:360] Out: 600 [Urine:600] Intake/Output this shift: No intake/output data recorded.  Gen: NAD, comfortable CV: irreg rate/rhythm Pulm: Normal work of breathing Abd: Soft, NT/ND. Incisions c/d/i without erythema or drainage. No palpable hernias Ext: SCDs in place  Lab Results: CBC  Recent Labs    08/07/20 0457 08/09/20 0245  WBC 12.2* 11.7*  HGB 10.8* 10.4*  HCT 35.8* 34.4*  PLT 395 377   BMET Recent Labs    08/08/20 0838 08/09/20 0245  NA 141 138  K 3.6 3.7  CL 104 103  CO2 26 25  GLUCOSE 113* 117*  BUN 20 30*  CREATININE 0.66 0.77  CALCIUM 9.2 9.1   PT/INR No results for input(s): LABPROT, INR in the last 72 hours. ABG Recent Labs    08/07/20 0003  PHART 7.502*  HCO3 22.1    Studies/Results:  Anti-infectives: Anti-infectives (From admission, onward)    Start     Dose/Rate Route Frequency Ordered Stop   08/04/20 1400  neomycin (MYCIFRADIN) tablet 1,000 mg  Status:  Discontinued       See Hyperspace for full Linked Orders Report.   1,000 mg Oral 3 times per day 08/04/20 1043 08/04/20 1048   08/04/20 1400  metroNIDAZOLE (FLAGYL) tablet 1,000 mg  Status:  Discontinued       See Hyperspace for full Linked Orders Report.   1,000 mg Oral 3 times per day 08/04/20 1043 08/04/20 1048   08/04/20 0600  cefoTEtan (CEFOTAN) 2 g  in sodium chloride 0.9 % 100 mL IVPB        2 g 200 mL/hr over 30 Minutes Intravenous On call to O.R. 08/03/20 0743 08/04/20 1256        Assessment/Plan: Patient Active Problem List   Diagnosis Date Noted   Pressure injury of skin 08/07/2020   Confusion, postoperative 08/07/2020   Acute pulmonary edema ([place]) 08/07/2020   Diverticular disease 08/07/2020   GERD (gastroesophageal reflux disease) 08/07/2020   History of Bell's palsy 08/07/2020   Mitral regurgitation 08/07/2020   Cancer of ascending colon s/p lap colectomy 08/04/2020 06/01/2020   Colonic mass    Closed intertrochanteric fracture ([place]) 04/12/2020   Colles' fracture 04/12/2020   Osteoarthritis of knee 04/12/2020   Symptomatic anemia 03/25/2020   HLD (hyperlipidemia) 03/25/2020   Bilateral leg edema 02/04/2020   Rapid atrial fibrillation ([place]) 01/11/2020   Chronic anticoagulation 01/11/2020   Chronic ulcer of left leg ([place]) 01/11/2020   Iron deficiency anemia 01/11/2020   Hypokalemia 01/01/2020   Left leg cellulitis 01/01/2020   Iron deficiency anemia due to chronic blood loss 01/01/2020   Wound cellulitis 12/31/2019   Aortic atherosclerosis ([place]) 09/25/2019   HOH (hard of hearing) 08/31/2019   History of pelvic fracture 08/31/2019   Pelvic fracture ([place]) 08/25/2019   Permanent atrial fibrillation ([place]) 08/25/2019   Pain    Dehydration  Accidental fall 07/26/2019   UTI (urinary tract infection) 07/26/2019   Closed fracture of right inferior pubic ramus ([place]) 07/26/2019   HTN (hypertension) 07/26/2019   Ambulatory dysfunction 07/26/2019   Pubic bone fracture ([place]) 07/26/2019   History of colon polyps 06/25/2017   Constipation, chronic 07/27/2013   Osteopenia 07/27/2013   s/p Procedure(s): LAPAROSCOPIC RIGHT HEMI COLECTOMY 08/04/2020  -Greatly appreciate medicine's assistance and involvement in her care -Clinically she continues to do reasonably well -Continue soft diet as tolerated -Working with  therapies -Ppx: [place] for PO anticoagulation , SCDs; on BID PPI currently -Dispo: SNF as per PT/OT recommendation   LOS: 5 days   Nadeen Landau, MD [facility] Surgery Use [email or web address] to contact on call provider

## 2020-08-09 NOTE — TOC Progression Note (Signed)
Transition of Care [facility]) - Progression Note    Patient Details  Name: [name] [name] MRN: [id number] Date of Birth: 10-Jan-1930  Transition of [facility]) CM/SW Contact  Kenneith Stief, Juliann Pulse, RN Phone Number: 08/09/2020, 12:36 PM  Clinical Narrative:   Bed offers given to son Edward-await choice-also spoke to [place] rep [place] beds available-informed Percell Miller. 1. 1.3 mi [facility] [place], [place] [address] [phone] Overall rating Above average 2. 1.6 mi [place] at  [address], [place] [address] [phone] Overall rating Much below average 3. 2.1 mi [place], [place] [address] [phone] Overall rating Much below average 4. 2.5 mi [facility] at [place], [place] [address] [phone] Overall rating Average 5. 2.8 mi [facility] at the [place], [place] [address] [phone] Overall rating Above average 6. 2.8 mi [place] [address] [place], [place] [address] [phone] Overall rating Average 7. 3 mi [facility] [place], [place] [address] [phone] Overall rating Much above average 8. 3.6 [place] [address] [place], [place] [address] [phone] Overall rating Average 9. 3.6 mi [facility] [address] [place], [place] [address] [phone] Overall rating Below average 10. 3.9 mi [facility] [place], [place] [address] [phone] Overall rating Below average 11. 4.4 mi [facility] at [place], [place] [address] [phone] Overall rating Much above average 12. 4.6  mi [facility] [address] [place], [place] [address] [phone] Overall rating Above average 13. 5.5 mi [facility] [address] [place], [place] [address] [phone] Overall rating Above average 14. 8.2 [facility] [place], [place] [address] [phone] Overall rating Much above average 15. 9 mi [facility] [address] [place], [place] [address] [phone] Overall rating Average 16. 9.1 mi [facility] and [place], [place] [address] [phone] Overall rating Below average 17. 9.2 mi [facility] [address] [place], [place] [address] [phone] Overall rating Much above average 18. 10.8 mi [place] at [facility] [address] [place], [place] [address] [phone] Overall rating Much above average 19. 12.6 mi [facility] [address] [place], [place] [address] [phone] Overall rating Much below average 20. 12.8 [facility] [place], [place] [address] [phone] Overall rating Much below average 21. 14.2 mi The [facility] [address] [place], [place] [address] [phone] Overall rating Much below average 22. 14.4 mi [facility], [place] [address] [phone] Overall rating Average 23. 14.8 mi [facility] [place], [place] [address] [phone] Overall rating Above average 24. 14.9 [place] [address] [place], [place] [address] [phone] Overall rating Much below average 25. 16.5 mi [place] [name] [place], [place] [address] [phone] Overall rating Above average 26. 16.7 mi [facility] [place],  [address] [phone] Overall rating Much above average 27.  17.9 mi [facility] [place], [place] [address] [phone] Overall rating Average 28. 67.6 [facility] [address] [place], [place] [address] [phone] Overall rating Much below average 29. 19.7 mi [place] [address] [place], [place] [address] [phone] Overall rating Much below average 30. 20 mi [address] at [employer] at [facility], [place] [address] [phone] Overall rating Much above average 31. 21.1 mi [facility] [place], [place] [address] [phone] Overall rating Much below average 32. 21.6 [address] [place], [place] [address] [phone] Overall rating Below average 33. 23.3 [facility] [address] [place], [place] [address] [phone] Overall rating Below average 34. 21.8 [place], [place] [address] [phone] Overall rating Much above average 35. [address] [place], [place] [address] [phone] Overall rating Much above average 36. 22.6 mi [facility] [address] [place], [place] [address] [phone] Overall rating Average 37. 22.7 mi [facility] [place], [place] [address] [phone] Overall rating Much below average 38. 23.3 mi [facility] [address] [place], [place] [address] [phone] Overall rating Average 39. 23.5 mi [place], [place] [address] [phone] Overall rating Much below average 40. 24.1 mi [place] [address] [place], [place] [address] [phone] Overall rating Below average 41. 24.2 mi [place] [address] [place], [place] [address] [phone] Overall rating Below average 42. 24.4 [facility] [address] [place], [place] [address] [phone] Overall rating Much below average 43. 24.5 mi [facility] [place], [place] [address] [phone] Overall rating Average To explore and download nursing home data,visit the dat   Expected Discharge Plan: [place] (vs. Home with home health) Barriers to Discharge: Continued Medical Work up  Expected Discharge Plan and Services Expected Discharge Plan: [place] (vs. Home with home health) In-house Referral: Clinical Social Work     Living arrangements for the past 2 months: Single Family Home                                       Social Determinants of Health (SDOH) Interventions    Readmission Risk Interventions No flowsheet data found.

## 2020-08-09 NOTE — Progress Notes (Signed)
PROGRESS NOTE    [name] [name]  DSK:[id number] DOB: 1929/11/06 DOA: 08/04/2020 PCP: [name], MD    Brief Narrative:  87 year old female with recently diagnosed colon cancer who underwent surgical resection on 7/7 and recovering.  Patient does have history of paroxysmal A. fib, hypertension, diastolic heart failure.  Postoperatively patient complained of chest discomfort and hypoxic episode.  She was found to be in and out of A. fib as well fluid overload so medicine consulted.  Assessment & Plan:   Principal Problem:   Cancer of ascending colon s/p lap colectomy 08/04/2020 Active Problems:   HTN (hypertension)   Permanent atrial fibrillation (HCC)   Iron deficiency anemia due to chronic blood loss   Chronic anticoagulation   Iron deficiency anemia   Constipation, chronic   HOH (hard of hearing)   Pressure injury of skin   Confusion, postoperative   Acute pulmonary edema (HCC)   GERD (gastroesophageal reflux disease)   Mitral regurgitation  Paroxysmal A. fib with RVR: Probably aggravated by postoperative events and fluid imbalance.  Patient is in and out of the A. fib.  Blood pressures are stable.  -Was on amiodarone 200 mg daily at home, resumed 7/10.  Will increase dose to 200 mg 2 times a day for 1 week. -She is allergic to metoprolol, will start patient on a scheduled diltiazem 60 mg every 8 hours.  If no adequate control with oral medications, will start Cardizem infusion.   -2D echocardiogram with ejection fraction of 30 to 35%, Xarelto resumed.  Acute systolic congestive heart failure: Probably aggravated by stress and tachycardia. Chest pain and hypoxia improved.  Mildly elevated troponins. Continue IV diuresis along with potassium supplement.  Some clinical improvement today. Will start patient on losartan 25 mg and increase doses as tolerated. Patient has allergy to beta-blockers. Patient is too frail and debilitated to undergo ischemia evaluation.  We  will manage  Hypertension: Blood pressures fairly stable.  Discontinue verapamil as starting diltiazem for rate control.  Colon adenocarcinoma: Status post surgical resection.  Plan as per surgery.  Stable as per surgery.   DVT prophylaxis: SCD's Start: 08/04/20 1600 Place TED hose Start: 08/04/20 1600 Rivaroxaban (XARELTO) tablet 15 mg   Code Status: Full code Family Communication: Patient's son on the phone. Disposition Plan: Status is: Inpatient  Remains inpatient appropriate because:Inpatient level of care appropriate due to severity of illness  Dispo: The patient is from: Home              Anticipated d/c is to: SNF              Patient currently is not medically stable to d/c.   Difficult to place patient No   Patient is still with fluctuating heart rate and A. fib with RVR, will need better rate control before discharge.      Consultants:  Hospital medicine.  Surgery is primary.  Procedures:  Right hemicolectomy 7/7  Antimicrobials:  None   Subjective: Patient seen and examined.  She had hearing aid today and was able to have better communication.  Patient tells me that she feels okay at rest, however feels tired and she is disappointed that now she is old and started having problems.  She was very proud of having previous healthy life and living good life. Patient tells me that she does not want to suffer and if things are not working well she would like to go peacefully.  I encouraged her to talk her wishes with her  son who is her primary Media planner.  I called and discussed case with patient's son on the phone who is anticipating that patient will recover from this congestive heart failure and surgical distress and go home with him.  Objective: Vitals:   08/09/20 1100 08/09/20 1200 08/09/20 1300 08/09/20 1302  BP: 117/66 (!) 126/105 (!) 116/54 (!) 116/54  Pulse: 66 65 (!) 45   Resp: 19 (!) 21 (!) 23   Temp:  98.1 F (36.7 C)    TempSrc:  Oral    SpO2:  97% 92% 100%   Weight:      Height:        Intake/Output Summary (Last 24 hours) at 08/09/2020 1339 Last data filed at 08/09/2020 0600 Gross per 24 hour  Intake 120 ml  Output 200 ml  Net -80 ml   Filed Weights   08/07/20 0355 08/08/20 0303 08/09/20 0605  Weight: 53.9 kg 51.6 kg 53.6 kg    Examination:  General exam: Appears calm and comfortable  Chronically sick looking and debilitated frail lady laying in bed.  On room air today. Respiratory system: Mostly bilateral clear.  She has poor air entry at both bases.  Unable to hear any crackles. Cardiovascular system: S1 & S2 heard, tachycardic.  Irregularly irregular.  No edema. Gastrointestinal system: Soft.  Bowel sounds present.  She has midline surgical incision which looks clean and dry. Central nervous system: Alert and oriented. No focal neurological deficits.  Data Reviewed: I have personally reviewed following labs and imaging studies  CBC: Recent Labs  Lab 08/05/20 0425 08/06/20 0404 08/07/20 0457 08/09/20 0245  WBC 12.5* 9.1 12.2* 11.7*  NEUTROABS  --   --   --  7.3  HGB 9.1* 8.9* 10.8* 10.4*  HCT 29.4* 29.8* 35.8* 34.4*  MCV 92.5 95.5 94.2 93.7  PLT 217 289 395 975   Basic Metabolic Panel: Recent Labs  Lab 08/05/20 0425 08/06/20 0404 08/07/20 0457 08/08/20 0838 08/09/20 0245  NA 139 138 141 141 138  K 3.2* 2.8* 4.2 3.6 3.7  CL 105 103 106 104 103  CO2 24 28 26 26 25   GLUCOSE 109* 82 127* 113* 117*  BUN 12 18 19 20  30*  CREATININE 0.79 0.72 0.69 0.66 0.77  CALCIUM 8.7* 8.7* 9.0 9.2 9.1  MG  --   --  2.0  --   --   PHOS  --   --  2.7  --   --    GFR: Estimated Creatinine Clearance: 39.4 mL/min (by C-G formula based on SCr of 0.77 mg/dL). Liver Function Tests: No results for input(s): AST, ALT, ALKPHOS, BILITOT, PROT, ALBUMIN in the last 168 hours. No results for input(s): LIPASE, AMYLASE in the last 168 hours. No results for input(s): AMMONIA in the last 168 hours. Coagulation Profile: No  results for input(s): INR, PROTIME in the last 168 hours. Cardiac Enzymes: No results for input(s): CKTOTAL, CKMB, CKMBINDEX, TROPONINI in the last 168 hours. BNP (last 3 results) No results for input(s): PROBNP in the last 8760 hours. HbA1C: No results for input(s): HGBA1C in the last 72 hours. CBG: Recent Labs  Lab 08/06/20 2350  GLUCAP 135*   Lipid Profile: No results for input(s): CHOL, HDL, LDLCALC, TRIG, CHOLHDL, LDLDIRECT in the last 72 hours. Thyroid Function Tests: No results for input(s): TSH, T4TOTAL, FREET4, T3FREE, THYROIDAB in the last 72 hours. Anemia Panel: No results for input(s): VITAMINB12, FOLATE, FERRITIN, TIBC, IRON, RETICCTPCT in the last 72 hours. Sepsis Labs: No  results for input(s): PROCALCITON, LATICACIDVEN in the last 168 hours.  Recent Results (from the past 240 hour(s))  SARS CORONAVIRUS 2 (TAT 6-24 HRS) Nasopharyngeal Nasopharyngeal Swab     Status: None   Collection Time: 08/02/20  1:23 PM   Specimen: Nasopharyngeal Swab  Result Value Ref Range Status   SARS Coronavirus 2 NEGATIVE NEGATIVE Final    Comment: (NOTE) SARS-CoV-2 target nucleic acids are NOT DETECTED.  The SARS-CoV-2 RNA is generally detectable in upper and lower respiratory specimens during the acute phase of infection. Negative results do not preclude SARS-CoV-2 infection, do not rule out co-infections with other pathogens, and should not be used as the sole basis for treatment or other patient management decisions. Negative results must be combined with clinical observations, patient history, and epidemiological information. The expected result is Negative.  Fact Sheet for Patients: SugarRoll.be  Fact Sheet for Healthcare Providers: https://www.woods-mathews.com/  This test is not yet approved or cleared by the [place] FDA and  has been authorized for detection and/or diagnosis of SARS-CoV-2 by FDA under an Emergency Use  Authorization (EUA). This EUA will remain  in effect (meaning this test can be used) for the duration of the COVID-19 declaration under Se ction 564(b)(1) of the Act, 21 U.S.C. section 360bbb-3(b)(1), unless the authorization is terminated or revoked sooner.  Performed at [facility], [place] [address], [place], [place] [address]   MRSA Next Gen by PCR, Nasal     Status: None   Collection Time: 08/07/20  3:31 AM   Specimen: Nasal Mucosa; Nasal Swab  Result Value Ref Range Status   MRSA by PCR Next Gen NOT DETECTED NOT DETECTED Final    Comment: (NOTE) The GeneXpert MRSA Assay (FDA approved for NASAL specimens only), is one component of a comprehensive MRSA colonization surveillance program. It is not intended to diagnose MRSA infection nor to guide or monitor treatment for MRSA infections. Test performance is not FDA approved in patients less than 47 years old. Performed at [facility], [place] [address], [place], [place] [address]          Radiology Studies: ECHOCARDIOGRAM COMPLETE  Result Date: 08/08/2020    ECHOCARDIOGRAM REPORT   Patient Name:   [name] [name] Date of Exam: 08/08/2020 Medical Rec #:  [id number]           Height:       63.0 in Accession #:    [id number]          Weight:       113.8 lb Date of Birth:  22-Mar-1929           BSA:          1.521 m Patient Age:    72 years            BP:           159/130 mmHg Patient Gender: F                   HR:           119 bpm. Exam Location:  Inpatient Procedure: 2D Echo, Color Doppler and Cardiac Doppler Indications:    CHF-Acute Diastolic I33.82  History:        Patient has prior history of Echocardiogram examinations, most                 recent 03/27/2020. Risk Factors:Hypertension.  Sonographer:    Bernadene Person RDCS Referring Phys: [id number] Jiles Prows A  KYLE IMPRESSIONS  1. Patient is in Afib with RVR during study.  2. Left ventricular ejection fraction, by estimation, is 30 to 35%. The left ventricle has  moderately decreased function. There is global hypokinesis that appears slightly worse in the basal-to-apical inferoseptal and basal-to-mid anteroseptal segments.  There is mild asymmetric left ventricular hypertrophy of the basal-septal segment. Diastolic function is indeterminant due to atrial fibrillation.  3. Right ventricular systolic function is moderately reduced. The right ventricular size is normal. There is normal pulmonary artery systolic pressure. The estimated right ventricular systolic pressure is 13.0 mmHg.  4. Left atrial size was severely dilated.  5. Right atrial size was mildly dilated.  6. Moderate pleural effusion in the left lateral region.  7. The mitral valve is myxomatous. Moderate mitral valve regurgitation.  8. The aortic valve is tricuspid. There is mild calcification of the aortic valve. There is mild thickening of the aortic valve. Aortic valve regurgitation is trivial. Mild aortic valve sclerosis is present, with no evidence of aortic valve stenosis.  9. The inferior vena cava is normal in size with greater than 50% respiratory variability, suggesting right atrial pressure of 3 mmHg. Comparison(s): Compared to prior TTE in 03/27/2020, the LVEF has dropped to 30-35% (previously 60-65%) in the setting of Afib with RVR. The mitral valve thickening is better appreciated on prior study. There continues to be moderate MR and a pleural effusion. FINDINGS  Left Ventricle: Left ventricular ejection fraction, by estimation, is 30 to 35%. The left ventricle has moderately decreased function. There is global hypokinesis that appears slightly worse in the basal-to-apical inferoseptal and basal-to-mid anteroseptal segments. The left ventricular internal cavity size was normal in size. There is mild asymmetric left ventricular hypertrophy of the basal-septal segment. Diastolic function is indeterminant due to atrial fibrillation. Right Ventricle: The right ventricular size is normal. Right vetricular  wall thickness was not well visualized. Right ventricular systolic function is moderately reduced. There is normal pulmonary artery systolic pressure. The tricuspid regurgitant velocity is 2.35 m/s, and with an assumed right atrial pressure of 3 mmHg, the estimated right ventricular systolic pressure is 86.5 mmHg. Left Atrium: Left atrial size was severely dilated. Right Atrium: Right atrial size was mildly dilated. Pericardium: There is no evidence of pericardial effusion. Mitral Valve: The mitral valve is myxomatous. Mild to moderate mitral annular calcification. Moderate mitral valve regurgitation. Tricuspid Valve: The tricuspid valve is normal in structure. Tricuspid valve regurgitation is trivial. Aortic Valve: The aortic valve is tricuspid. There is mild calcification of the aortic valve. There is mild thickening of the aortic valve. Aortic valve regurgitation is trivial. Aortic regurgitation PHT measures 639 msec. Mild aortic valve sclerosis is present, with no evidence of aortic valve stenosis. Pulmonic Valve: The pulmonic valve was normal in structure. Pulmonic valve regurgitation is trivial. Aorta: The aortic root and ascending aorta are structurally normal, with no evidence of dilitation. Venous: The inferior vena cava is normal in size with greater than 50% respiratory variability, suggesting right atrial pressure of 3 mmHg. IAS/Shunts: No atrial level shunt detected by color flow Doppler. Additional Comments: There is a moderate pleural effusion in the left lateral region.  LEFT VENTRICLE PLAX 2D LVIDd:         4.20 cm LVIDs:         3.20 cm LV PW:         1.00 cm LV IVS:        0.80 cm LVOT diam:     1.90 cm LV  SV:         27 LV SV Index:   18 LVOT Area:     2.84 cm  LV Volumes (MOD) LV vol d, MOD A2C: 81.0 ml LV vol d, MOD A4C: 66.6 ml LV vol s, MOD A2C: 44.0 ml LV vol s, MOD A4C: 34.2 ml LV SV MOD A2C:     37.0 ml LV SV MOD A4C:     66.6 ml LV SV MOD BP:      35.1 ml RIGHT VENTRICLE TAPSE (M-mode):  1.3 cm LEFT ATRIUM           Index       RIGHT ATRIUM           Index LA diam:      4.20 cm 2.76 cm/m  RA Area:     12.70 cm LA Vol (A2C): 50.6 ml 33.26 ml/m RA Volume:   32.40 ml  21.30 ml/m LA Vol (A4C): 53.4 ml 35.10 ml/m  AORTIC VALVE LVOT Vmax:   70.53 cm/s LVOT Vmean:  50.300 cm/s LVOT VTI:    0.096 m AI PHT:      639 msec  AORTA Ao Root diam: 2.90 cm Ao Asc diam:  3.00 cm MR Peak grad:    102.4 mmHg  TRICUSPID VALVE MR Mean grad:    66.0 mmHg   TR Peak grad:   22.1 mmHg MR Vmax:         506.00 cm/s TR Vmax:        235.00 cm/s MR Vmean:        384.0 cm/s MR PISA:         0.57 cm    SHUNTS MR PISA Eff ROA: 4 mm       Systemic VTI:  0.10 m MR PISA Radius:  0.30 cm     Systemic Diam: 1.90 cm Gwyndolyn Kaufman MD Electronically signed by Gwyndolyn Kaufman MD Signature Date/Time: 08/08/2020/11:52:53 AM    Final         Scheduled Meds:  acetaminophen  1,000 mg Oral Q6H   amiodarone  200 mg Oral BID   Chlorhexidine Gluconate Cloth  6 each Topical Q0600   diltiazem  60 mg Oral Q8H   feeding supplement  237 mL Oral BID BM   fluticasone  1 spray Each Nare Daily   furosemide  40 mg Intravenous Daily   iron polysaccharides  150 mg Oral BID   lip balm  1 application Topical BID   losartan  25 mg Oral Daily   melatonin  3 mg Oral QHS   multivitamin with minerals  1 tablet Oral Daily   pantoprazole  40 mg Oral BID AC   polycarbophil  625 mg Oral BID   potassium chloride  40 mEq Oral Daily   rivaroxaban  15 mg Oral Q supper   Continuous Infusions:   LOS: 5 days   Time spent: 30 minutes    Barb Merino, MD [facility] Pager [phone]

## 2020-08-09 NOTE — Progress Notes (Signed)
    BRIEF OVERNIGHT PROGRESS REPORT  Notified by RN for return in Rhythm rate to a-fib at 130bpm.  Additional dose of Amiodarone 200mg  PO is ordered once to initiate the intended Amiodarone 400mg  PO that was noted by Dr Sloan Leiter.  Patient has limitations on other medications (please see previous notes)    [name] [name] MSNA ACNPC-AG Acute Care Nurse Practitioner [place]

## 2020-08-10 MED ORDER — TRAZODONE HCL 50 MG PO TABS
25.0000 mg | ORAL_TABLET | Freq: Once | ORAL | Status: AC
  Administered 2020-08-10: 25 mg via ORAL
  Filled 2020-08-10: qty 1

## 2020-08-10 MED ORDER — DILTIAZEM HCL ER COATED BEADS 180 MG PO CP24: 180.0000 mg | ORAL_CAPSULE | Freq: Every day | ORAL | Status: DC

## 2020-08-10 MED ORDER — TRAZODONE HCL 50 MG PO TABS
25.0000 mg | ORAL_TABLET | Freq: Every evening | ORAL | Status: DC | PRN
  Administered 2020-08-10 – 2020-08-11 (×2): 25 mg via ORAL
  Filled 2020-08-10 (×2): qty 1

## 2020-08-10 MED ORDER — DILTIAZEM HCL ER COATED BEADS 120 MG PO CP24
120.0000 mg | ORAL_CAPSULE | Freq: Once | ORAL | Status: AC
  Administered 2020-08-10: 120 mg via ORAL
  Filled 2020-08-10: qty 1

## 2020-08-10 NOTE — Progress Notes (Signed)
PROGRESS NOTE    [name] [name]  SJG:[id number] DOB: May 14, 1929 DOA: 08/04/2020 PCP: [name], MD    Brief Narrative:  87 year old female with recently diagnosed colon cancer who underwent surgical resection on 7/7 and was recovering.  Patient does have history of paroxysmal A. fib, hypertension, diastolic heart failure.  Postoperatively patient complained of chest discomfort and hypoxic episode.  She was found to be in and out of A. fib as well fluid overload so medicine was consulted.  Assessment & Plan:   Principal Problem:   Cancer of ascending colon s/p lap colectomy 08/04/2020 Active Problems:   HTN (hypertension)   Permanent atrial fibrillation (HCC)   Iron deficiency anemia due to chronic blood loss   Chronic anticoagulation   Iron deficiency anemia   Constipation, chronic   HOH (hard of hearing)   Pressure injury of skin   Confusion, postoperative   Acute pulmonary edema (HCC)   GERD (gastroesophageal reflux disease)   Mitral regurgitation  Paroxysmal A. fib with RVR: aggravated by postoperative events and fluid imbalance.  Patient is in and out of the A. fib.  Blood pressures are stable.  -Was on amiodarone 200 mg daily at home, resumed 7/10.  Will increase dose to 200 mg 2 times a day for 1 week. -She is allergic to metoprolol, unable to add on other rate control medications.   Heart rate better on Cardizem 60 mg 3 times a day.  Will change to long-acting Cardizem today.   -2D echocardiogram with ejection fraction of 30 to 35%, Xarelto resumed.  Acute systolic congestive heart failure: Probably aggravated by stress and tachycardia. Chest pain and hypoxia improved.  Mildly elevated troponins. Continue IV diuresis along with potassium supplement.  Remains euvolemic today.  Will change to oral Lasix tomorrow if adequate improvement. Patient is started on losartan and tolerating well. Patient is allergic to metoprolol. Patient is too frail and debilitated to  undergo ischemia evaluation.   Patient will follow up with her cardiologist after discharge.  Hypertension: Blood pressures fairly stable.  Discontinue verapamil as starting diltiazem for rate control.  Colon adenocarcinoma: Status post surgical resection.  Plan as per surgery.  Stable as per surgery.  Now tolerating regular diet.   DVT prophylaxis: SCD's Start: 08/04/20 1600 Place TED hose Start: 08/04/20 1600 Rivaroxaban (XARELTO) tablet 15 mg   Code Status: Full code Family Communication: Patient's son on the phone 7/12. Disposition Plan: Status is: Inpatient  Remains inpatient appropriate because:Inpatient level of care appropriate due to severity of illness  Dispo: The patient is from: Home              Anticipated d/c is to: SNF              Patient currently is not medically stable to d/c.   Difficult to place patient No   Patient is still with fluctuating heart rate and A. fib with RVR, will need better rate control before discharge. Anticipate she will be ready to be discharged tomorrow.      Consultants:  Hospital medicine.  Surgery is primary.  Procedures:  Right hemicolectomy 7/7  Antimicrobials:  None   Subjective: Patient seen and examined.  She was given some sleep medicine overnight and now sleepy all morning.  No other events.  Denies any chest pain or palpitations. Patient received melatonin and trazodone at night.  Will discontinue trazodone.  Use low-dose melatonin for insomnia.  Objective: Vitals:   08/10/20 1100 08/10/20 1115 08/10/20 1130 08/10/20 1145  BP:    (!) 107/41  Pulse: (!) 53 67 60 72  Resp: (!) 31 (!) 24 18 (!) 24  Temp:      TempSrc:      SpO2: 98% 98% 99% 95%  Weight:      Height:        Intake/Output Summary (Last 24 hours) at 08/10/2020 1224 Last data filed at 08/10/2020 1000 Gross per 24 hour  Intake 660 ml  Output 550 ml  Net 110 ml   Filed Weights   08/08/20 0303 08/09/20 0605 08/10/20 0500  Weight: 51.6 kg 53.6  kg 54.6 kg    Examination:  General exam: Appears calm and comfortable  Chronically sick looking and debilitated frail lady laying in bed.  On room air.  Sleepy today. Respiratory system: Mostly bilateral clear.  She has poor air entry at both bases.  Unable to hear any crackles. Cardiovascular system: S1 & S2 heard, tachycardic.  Irregularly irregular.  No edema. Gastrointestinal system: Soft.  Bowel sounds present.  She has midline surgical incision which looks clean and dry. Central nervous system: Alert and oriented. No focal neurological deficits.  Data Reviewed: I have personally reviewed following labs and imaging studies  CBC: Recent Labs  Lab 08/05/20 0425 08/06/20 0404 08/07/20 0457 08/09/20 0245  WBC 12.5* 9.1 12.2* 11.7*  NEUTROABS  --   --   --  7.3  HGB 9.1* 8.9* 10.8* 10.4*  HCT 29.4* 29.8* 35.8* 34.4*  MCV 92.5 95.5 94.2 93.7  PLT 217 289 395 417   Basic Metabolic Panel: Recent Labs  Lab 08/05/20 0425 08/06/20 0404 08/07/20 0457 08/08/20 0838 08/09/20 0245  NA 139 138 141 141 138  K 3.2* 2.8* 4.2 3.6 3.7  CL 105 103 106 104 103  CO2 24 28 26 26 25   GLUCOSE 109* 82 127* 113* 117*  BUN 12 18 19 20  30*  CREATININE 0.79 0.72 0.69 0.66 0.77  CALCIUM 8.7* 8.7* 9.0 9.2 9.1  MG  --   --  2.0  --   --   PHOS  --   --  2.7  --   --    GFR: Estimated Creatinine Clearance: 39.4 mL/min (by C-G formula based on SCr of 0.77 mg/dL). Liver Function Tests: No results for input(s): AST, ALT, ALKPHOS, BILITOT, PROT, ALBUMIN in the last 168 hours. No results for input(s): LIPASE, AMYLASE in the last 168 hours. No results for input(s): AMMONIA in the last 168 hours. Coagulation Profile: No results for input(s): INR, PROTIME in the last 168 hours. Cardiac Enzymes: No results for input(s): CKTOTAL, CKMB, CKMBINDEX, TROPONINI in the last 168 hours. BNP (last 3 results) No results for input(s): PROBNP in the last 8760 hours. HbA1C: No results for input(s): HGBA1C in  the last 72 hours. CBG: Recent Labs  Lab 08/06/20 2350  GLUCAP 135*   Lipid Profile: No results for input(s): CHOL, HDL, LDLCALC, TRIG, CHOLHDL, LDLDIRECT in the last 72 hours. Thyroid Function Tests: No results for input(s): TSH, T4TOTAL, FREET4, T3FREE, THYROIDAB in the last 72 hours. Anemia Panel: No results for input(s): VITAMINB12, FOLATE, FERRITIN, TIBC, IRON, RETICCTPCT in the last 72 hours. Sepsis Labs: No results for input(s): PROCALCITON, LATICACIDVEN in the last 168 hours.  Recent Results (from the past 240 hour(s))  SARS CORONAVIRUS 2 (TAT 6-24 HRS) Nasopharyngeal Nasopharyngeal Swab     Status: None   Collection Time: 08/02/20  1:23 PM   Specimen: Nasopharyngeal Swab  Result Value Ref Range Status  SARS Coronavirus 2 NEGATIVE NEGATIVE Final    Comment: (NOTE) SARS-CoV-2 target nucleic acids are NOT DETECTED.  The SARS-CoV-2 RNA is generally detectable in upper and lower respiratory specimens during the acute phase of infection. Negative results do not preclude SARS-CoV-2 infection, do not rule out co-infections with other pathogens, and should not be used as the sole basis for treatment or other patient management decisions. Negative results must be combined with clinical observations, patient history, and epidemiological information. The expected result is Negative.  Fact Sheet for Patients: SugarRoll.be  Fact Sheet for Healthcare Providers: https://www.woods-mathews.com/  This test is not yet approved or cleared by the [place] FDA and  has been authorized for detection and/or diagnosis of SARS-CoV-2 by FDA under an Emergency Use Authorization (EUA). This EUA will remain  in effect (meaning this test can be used) for the duration of the COVID-19 declaration under Se ction 564(b)(1) of the Act, 21 U.S.C. section 360bbb-3(b)(1), unless the authorization is terminated or revoked sooner.  Performed at [facility], [place] [address] [place], [place] [address]   MRSA Next Gen by PCR, Nasal     Status: None   Collection Time: 08/07/20  3:31 AM   Specimen: Nasal Mucosa; Nasal Swab  Result Value Ref Range Status   MRSA by PCR Next Gen NOT DETECTED NOT DETECTED Final    Comment: (NOTE) The GeneXpert MRSA Assay (FDA approved for NASAL specimens only), is one component of a comprehensive MRSA colonization surveillance program. It is not intended to diagnose MRSA infection nor to guide or monitor treatment for MRSA infections. Test performance is not FDA approved in patients less than 86 years old. Performed at [facility], [place] [address], [place], [place] [address]          Radiology Studies: No results found.      Scheduled Meds:  acetaminophen  1,000 mg Oral Q6H   amiodarone  200 mg Oral BID   Chlorhexidine Gluconate Cloth  6 each Topical Q0600   diltiazem  120 mg Oral Once   [START ON 08/11/2020] diltiazem  180 mg Oral Daily   feeding supplement  237 mL Oral BID BM   fluticasone  1 spray Each Nare Daily   furosemide  40 mg Intravenous Daily   iron polysaccharides  150 mg Oral BID   lip balm  1 application Topical BID   losartan  25 mg Oral Daily   melatonin  3 mg Oral QHS   multivitamin with minerals  1 tablet Oral Daily   pantoprazole  40 mg Oral BID AC   polycarbophil  625 mg Oral BID   potassium chloride  40 mEq Oral Daily   rivaroxaban  15 mg Oral Q supper   Continuous Infusions:   LOS: 6 days   Time spent: 30 minutes    Barb Merino, MD [facility] Pager [phone]

## 2020-08-10 NOTE — Progress Notes (Signed)
Subjective Remains in SDU and medicine following for CHF/exacerbation, afib/rvr. She feels well, no n/v. Tolerating diet without issue and having flatus and Bms. Denies abdominal pain. Denies chest pain or SOB at this point. Resting comfortably in bed this morning  Objective: Vital signs in last 24 hours: Temp:  [97.8 F (36.6 C)-98.1 F (36.7 C)] 98 F (36.7 C) (07/13 0730) Pulse Rate:  [45-130] 103 (07/13 0700) Resp:  [16-24] 19 (07/13 0700) BP: (112-148)/(54-108) 118/70 (07/13 0700) SpO2:  [92 %-100 %] 95 % (07/13 0700) Weight:  [54.6 kg] 54.6 kg (07/13 0500) Last BM Date: 08/09/20  Intake/Output from previous day: 07/12 0701 - 07/13 0700 In: 960 [P.O.:960] Out: 250 [Urine:250] Intake/Output this shift: No intake/output data recorded.  Gen: NAD, comfortable, sleepy but arouses to voice CV: irreg rate/rhythm Pulm: Normal work of breathing Abd: Soft, NT/ND. Incisions c/d/i without erythema or drainage. No palpable hernias Ext: SCDs in place  Lab Results: CBC  Recent Labs    08/09/20 0245  WBC 11.7*  HGB 10.4*  HCT 34.4*  PLT 377   BMET Recent Labs    08/08/20 0838 08/09/20 0245  NA 141 138  K 3.6 3.7  CL 104 103  CO2 26 25  GLUCOSE 113* 117*  BUN 20 30*  CREATININE 0.66 0.77  CALCIUM 9.2 9.1   PT/INR No results for input(s): LABPROT, INR in the last 72 hours. ABG No results for input(s): PHART, HCO3 in the last 72 hours.  Invalid input(s): PCO2, PO2   Studies/Results:  Anti-infectives: Anti-infectives (From admission, onward)    Start     Dose/Rate Route Frequency Ordered Stop   08/04/20 1400  neomycin (MYCIFRADIN) tablet 1,000 mg  Status:  Discontinued       See Hyperspace for full Linked Orders Report.   1,000 mg Oral 3 times per day 08/04/20 1043 08/04/20 1048   08/04/20 1400  metroNIDAZOLE (FLAGYL) tablet 1,000 mg  Status:  Discontinued       See Hyperspace for full Linked Orders Report.   1,000 mg Oral 3 times per day 08/04/20 1043  08/04/20 1048   08/04/20 0600  cefoTEtan (CEFOTAN) 2 g in sodium chloride 0.9 % 100 mL IVPB        2 g 200 mL/hr over 30 Minutes Intravenous On call to O.R. 08/03/20 0743 08/04/20 1256        Assessment/Plan: Patient Active Problem List   Diagnosis Date Noted   Pressure injury of skin 08/07/2020   Confusion, postoperative 08/07/2020   Acute pulmonary edema ([place]) 08/07/2020   Diverticular disease 08/07/2020   GERD (gastroesophageal reflux disease) 08/07/2020   History of Bell's palsy 08/07/2020   Mitral regurgitation 08/07/2020   Cancer of ascending colon s/p lap colectomy 08/04/2020 06/01/2020   Colonic mass    Closed intertrochanteric fracture ([place]) 04/12/2020   Colles' fracture 04/12/2020   Osteoarthritis of knee 04/12/2020   Symptomatic anemia 03/25/2020   HLD (hyperlipidemia) 03/25/2020   Bilateral leg edema 02/04/2020   Rapid atrial fibrillation ([place]) 01/11/2020   Chronic anticoagulation 01/11/2020   Chronic ulcer of left leg ([place]) 01/11/2020   Iron deficiency anemia 01/11/2020   Hypokalemia 01/01/2020   Left leg cellulitis 01/01/2020   Iron deficiency anemia due to chronic blood loss 01/01/2020   Wound cellulitis 12/31/2019   Aortic atherosclerosis ([place]) 09/25/2019   HOH (hard of hearing) 08/31/2019   History of pelvic fracture 08/31/2019   Pelvic fracture ([place]) 08/25/2019   Permanent atrial fibrillation ([place]) 08/25/2019   Pain  Dehydration    Accidental fall 07/26/2019   UTI (urinary tract infection) 07/26/2019   Closed fracture of right inferior pubic ramus ([place]) 07/26/2019   HTN (hypertension) 07/26/2019   Ambulatory dysfunction 07/26/2019   Pubic bone fracture ([place]) 07/26/2019   History of colon polyps 06/25/2017   Constipation, chronic 07/27/2013   Osteopenia 07/27/2013   s/p Procedure(s): LAPAROSCOPIC RIGHT HEMI COLECTOMY 08/04/2020  -Greatly appreciate medicine's assistance and involvement in her care -Clinically she continues to do reasonably  well -Continue soft diet as tolerated -Working with therapies -Ppx: [place] for PO anticoagulation , SCDs; on BID PPI currently -Dispo: SNF as per PT/OT recommendation   LOS: 6 days   Nadeen Landau, MD [facility] Surgery Use [email or web address] to contact on call provider

## 2020-08-10 NOTE — Progress Notes (Signed)
Occupational Therapy Treatment Patient Details Name: [name] [name] MRN: [id number] DOB: [birth year] Today's Date: 08/10/2020    History of present illness Patient is an 87 year old female s/p R hemi colectomy due to colon CA. PMH includes HTN, HLD, afib, hearing loss   OT comments  Patient min assist to stand from recliner and toilet. Min guard to ambulate with Rollator to bathroom. HR up to 130 with ambulation but sats WFL when pleth signal good. Patient mod assist for toileting for clothing management but able to wipe self multiple times. Patient returned to recliner. Patient overall exhibits generalized weakness, impaired balance and decreased activity tolerance. Patient would benefit from short term rehab at discharge.  Follow Up Recommendations  SNF    Equipment Recommendations  3 in 1 bedside commode    Recommendations for Other Services      Precautions / Restrictions Precautions Precautions: Fall Precaution Comments: abd sx, deaf left ear, hearing aide right ear Restrictions Weight Bearing Restrictions: No       Mobility Bed Mobility               General bed mobility comments: up in chair    Transfers Overall transfer level: Needs assistance Equipment used: 4-wheeled walker Transfers: Sit to/from Stand Sit to Stand: Min assist         General transfer comment: Min assist to stand from recliner. used patietn's rollator (breaks don't work). Min guard to ambulate to bathroom and then return to recliner after toileting. HR up to 130 with ambulation.    Balance   Sitting-balance support: No upper extremity supported Sitting balance-Leahy Scale: Good     Standing balance support: Bilateral upper extremity supported Standing balance-Leahy Scale: Poor Standing balance comment: reliant on UE support                           ADL either performed or assessed with clinical judgement   ADL Overall ADL's : Needs assistance/impaired                          Toilet Transfer: Minimal assistance Toilet Transfer Details (indicate cue type and reason): min assist for sit to stand from toilet. verbal cues to use grab bar. Toileting- Clothing Manipulation and Hygiene: Moderate assistance;Sit to/from stand;Sitting/lateral lean Toileting - Clothing Manipulation Details (indicate cue type and reason): mod assist for clothing management. Patient able to wipe in seated position.             Vision Patient Visual Report: No change from baseline     Perception     Praxis      Cognition Arousal/Alertness: Awake/alert Behavior During Therapy: WFL for tasks assessed/performed Overall Cognitive Status: Within Functional Limits for tasks assessed                                          Exercises     Shoulder Instructions       General Comments      Pertinent Vitals/ Pain       Pain Assessment: Faces Faces Pain Scale: Hurts a little bit Pain Location: abdomen Pain Descriptors / Indicators: Grimacing Pain Intervention(s): Limited activity within patient's tolerance;Monitored during session  Home Living  Prior Functioning/Environment              Frequency  Min 2X/week        Progress Toward Goals  OT Goals(current goals can now be found in the care plan section)  Progress towards OT goals: Progressing toward goals  Acute Rehab OT Goals Patient Stated Goal: to get strength back OT Goal Formulation: With patient Time For Goal Achievement: 08/19/20 Potential to Achieve Goals: Good  Plan Discharge plan needs to be updated    Co-evaluation          OT goals addressed during session: ADL's and self-care      AM-PAC OT "6 Clicks" Daily Activity     Outcome Measure   Help from another person eating meals?: None Help from another person taking care of personal grooming?: A Little Help from another person toileting,  which includes using toliet, bedpan, or urinal?: A Lot Help from another person bathing (including washing, rinsing, drying)?: A Lot Help from another person to put on and taking off regular upper body clothing?: A Little Help from another person to put on and taking off regular lower body clothing?: A Lot 6 Click Score: 16    End of Session Equipment Utilized During Treatment: Rolling walker;Gait belt  OT Visit Diagnosis: Unsteadiness on feet (R26.81);Other abnormalities of gait and mobility (R26.89);Muscle weakness (generalized) (M62.81)   Activity Tolerance Patient tolerated treatment well   Patient Left in chair;with call bell/phone within reach;with chair alarm set   Nurse Communication Mobility status        Time: 2841-3244 OT Time Calculation (min): 21 min  Charges: OT General Charges $OT Visit: 1 Visit OT Treatments $Self Care/Home Management : 8-22 mins  Derl Barrow, OTR/L [place]  Office [phone] Pager: [address] 08/10/2020, 5:24 PM

## 2020-08-10 NOTE — TOC Progression Note (Addendum)
Transition of Care [facility]) - Progression Note    Patient Details  Name: [name] [name] MRN: [id number] Date of Birth: 14-Aug-1929  [facility] CM/SW Contact  Lynell Kussman, Juliann Pulse, RN Phone Number: 08/10/2020, 2:53 PM  Clinical Narrative: f/u with son [name] [name] on SNF choice-still wants to [facility] to [facility] though CM has informed they have not accpeted-[facility] will still contact [facility] other SNF bed offers.     Expected Discharge Plan: [place] (vs. Home with home health) Barriers to Discharge: Continued Medical Work up  Expected Discharge Plan and Services Expected Discharge Plan: [place] (vs. Home with home health) In-house Referral: Clinical Social Work     Living arrangements for the past 2 months: Single Family Home                                       Social Determinants of Health (SDOH) Interventions    Readmission Risk Interventions No flowsheet data found.

## 2020-08-11 MED ORDER — AMIODARONE HCL 200 MG PO TABS
400.0000 mg | ORAL_TABLET | Freq: Two times a day (BID) | ORAL | Status: AC
  Administered 2020-08-11 – 2020-08-16 (×11): 400 mg via ORAL
  Filled 2020-08-11 (×11): qty 2

## 2020-08-11 MED ORDER — FUROSEMIDE 40 MG PO TABS
40.0000 mg | ORAL_TABLET | Freq: Two times a day (BID) | ORAL | Status: DC
  Administered 2020-08-11 – 2020-08-14 (×8): 40 mg via ORAL
  Filled 2020-08-11 (×8): qty 1

## 2020-08-11 NOTE — Progress Notes (Signed)
Subjective Remains in SDU and medicine following for CHF/exacerbation, afib/rvr. She feels well, no n/v. Tolerating diet without issue and having flatus and Bms x3 yesterday. She continues to deny any abdominal pain. Denies chest pain or SOB at this point.  Objective: Vital signs in last 24 hours: Temp:  [97.7 F (36.5 C)-97.9 F (36.6 C)] 97.9 F (36.6 C) (07/14 0400) Pulse Rate:  [42-166] 51 (07/14 0000) Resp:  [16-34] 27 (07/14 0000) BP: (94-153)/(35-105) 137/105 (07/14 0000) SpO2:  [83 %-100 %] 98 % (07/14 0000) Weight:  [56.3 kg] 56.3 kg (07/14 0500) Last BM Date: 08/10/20  Intake/Output from previous day: 07/13 0701 - 07/14 0700 In: 230 [P.O.:230] Out: 700 [Urine:700] Intake/Output this shift: No intake/output data recorded.  Gen: NAD, comfortable, sleepy but arouses to voice CV: irreg rate/rhythm Pulm: Normal work of breathing Abd: Soft, NT/ND. Incisions c/d/i without erythema or drainage. No palpable hernias Ext: SCDs in place  Lab Results: CBC  Recent Labs    08/09/20 0245  WBC 11.7*  HGB 10.4*  HCT 34.4*  PLT 377   BMET Recent Labs    08/08/20 0838 08/09/20 0245  NA 141 138  K 3.6 3.7  CL 104 103  CO2 26 25  GLUCOSE 113* 117*  BUN 20 30*  CREATININE 0.66 0.77  CALCIUM 9.2 9.1   PT/INR No results for input(s): LABPROT, INR in the last 72 hours. ABG No results for input(s): PHART, HCO3 in the last 72 hours.  Invalid input(s): PCO2, PO2   Studies/Results:  Anti-infectives: Anti-infectives (From admission, onward)    Start     Dose/Rate Route Frequency Ordered Stop   08/04/20 1400  neomycin (MYCIFRADIN) tablet 1,000 mg  Status:  Discontinued       See Hyperspace for full Linked Orders Report.   1,000 mg Oral 3 times per day 08/04/20 1043 08/04/20 1048   08/04/20 1400  metroNIDAZOLE (FLAGYL) tablet 1,000 mg  Status:  Discontinued       See Hyperspace for full Linked Orders Report.   1,000 mg Oral 3 times per day 08/04/20 1043 08/04/20  1048   08/04/20 0600  cefoTEtan (CEFOTAN) 2 g in sodium chloride 0.9 % 100 mL IVPB        2 g 200 mL/hr over 30 Minutes Intravenous On call to O.R. 08/03/20 0743 08/04/20 1256        Assessment/Plan: Patient Active Problem List   Diagnosis Date Noted   Pressure injury of skin 08/07/2020   Confusion, postoperative 08/07/2020   Acute pulmonary edema ([place]) 08/07/2020   Diverticular disease 08/07/2020   GERD (gastroesophageal reflux disease) 08/07/2020   History of Bell's palsy 08/07/2020   Mitral regurgitation 08/07/2020   Cancer of ascending colon s/p lap colectomy 08/04/2020 06/01/2020   Colonic mass    Closed intertrochanteric fracture ([place]) 04/12/2020   Colles' fracture 04/12/2020   Osteoarthritis of knee 04/12/2020   Symptomatic anemia 03/25/2020   HLD (hyperlipidemia) 03/25/2020   Bilateral leg edema 02/04/2020   Rapid atrial fibrillation ([place]) 01/11/2020   Chronic anticoagulation 01/11/2020   Chronic ulcer of left leg ([place]) 01/11/2020   Iron deficiency anemia 01/11/2020   Hypokalemia 01/01/2020   Left leg cellulitis 01/01/2020   Iron deficiency anemia due to chronic blood loss 01/01/2020   Wound cellulitis 12/31/2019   Aortic atherosclerosis ([place]) 09/25/2019   HOH (hard of hearing) 08/31/2019   History of pelvic fracture 08/31/2019   Pelvic fracture ([place]) 08/25/2019   Permanent atrial fibrillation ([place]) 08/25/2019   Pain  Dehydration    Accidental fall 07/26/2019   UTI (urinary tract infection) 07/26/2019   Closed fracture of right inferior pubic ramus ([place]) 07/26/2019   HTN (hypertension) 07/26/2019   Ambulatory dysfunction 07/26/2019   Pubic bone fracture ([place]) 07/26/2019   History of colon polyps 06/25/2017   Constipation, chronic 07/27/2013   Osteopenia 07/27/2013   s/p Procedure(s): LAPAROSCOPIC RIGHT HEMI COLECTOMY 08/04/2020  -Greatly appreciate medicine's assistance and involvement in her care -Clinically she continues to do reasonably well -Continue  soft diet as tolerated -Working with therapies -Ppx: PO anticoagulation , SCDs; on BID PPI currently -Dispo: Transfer to telemetry as per Dr. Sloan Leiter. Planning SNF as per PT/OT once cleared by medicine for discharge   LOS: 7 days   Nadeen Landau, MD [facility] Surgery Use [email or web address] to contact on call provider

## 2020-08-11 NOTE — Progress Notes (Signed)
Physical Therapy Treatment Patient Details Name: [name] [name] MRN: [id number] DOB: 06-01-1929 Today's Date: 08/11/2020    History of Present Illness Patient is an 87 year old female s/p R hemi colectomy due to colon CA. PMH includes HTN, HLD, afib, hearing loss    PT Comments    Patient  is eager to get up to eat breakfast. Patient  is improving in mobility, assisting self with bed mobility. Assisted to recliner using her rollator. Continue PT for progressive  ambulation as HR allows.  HR 101-13 with activity.   Follow Up Recommendations  SNF     Equipment Recommendations    None-get her rollator brakes fixed   Recommendations for Other Services       Precautions / Restrictions Precautions Precaution Comments: abd sx, deaf left ear, hearing aide right ear    Mobility  Bed Mobility Overal bed mobility: Needs Assistance Bed Mobility: Rolling;Sidelying to Sit   Sidelying to sit: Min assist       General bed mobility comments: assist with trunk, patient moved legs  over edge    Transfers Overall transfer level: Needs assistance Equipment used: 4-wheeled walker Transfers: Sit to/from Stand Sit to Stand: Min assist         General transfer comment: Min assist to stand from bed. used patient's rollator (breaks don't work). min assist to transfer around to recliner. HR up to 131  Ambulation/Gait                 Stairs             Wheelchair Mobility    Modified Rankin (Stroke Patients Only)       Balance   Sitting-balance support: No upper extremity supported Sitting balance-Leahy Scale: Good     Standing balance support: Bilateral upper extremity supported Standing balance-Leahy Scale: Poor Standing balance comment: reliant on UE support                            Cognition Arousal/Alertness: Awake/alert Behavior During Therapy: WFL for tasks assessed/performed Overall Cognitive Status: Within Functional Limits for  tasks assessed                                 General Comments: WFL for questions asked, pt very HOH, talked into right ear      Exercises      General Comments        Pertinent Vitals/Pain Faces Pain Scale: Hurts a little bit Pain Location: abdomen Pain Descriptors / Indicators: Grimacing Pain Intervention(s): Monitored during session    Home Living                      Prior Function            PT Goals (current goals can now be found in the care plan section) Progress towards PT goals: Progressing toward goals    Frequency    Min 2X/week      PT Plan Current plan remains appropriate    Co-evaluation              AM-PAC PT "6 Clicks" Mobility   Outcome Measure  Help needed turning from your back to your side while in a flat bed without using bedrails?: A Little Help needed moving from lying on your back to sitting on the side of a flat bed without  using bedrails?: A Little Help needed moving to and from a bed to a chair (including a wheelchair)?: A Little Help needed standing up from a chair using your arms (e.g., wheelchair or bedside chair)?: A Little Help needed to walk in hospital room?: A Lot Help needed climbing 3-5 steps with a railing? : A Lot 6 Click Score: 16    End of Session Equipment Utilized During Treatment: Gait belt Activity Tolerance: Patient limited by fatigue;Patient limited by pain Patient left: in chair;with call bell/phone within reach;with chair alarm set Nurse Communication: Mobility status PT Visit Diagnosis: Other abnormalities of gait and mobility (R26.89)     Time: 0370-9643 PT Time Calculation (min) (ACUTE ONLY): 24 min  Charges:  $Therapeutic Activity: 23-37 mins                     {Baudelio Karnes PT Acute Rehabilitation Services Pager [phone] Office [phone]    Claretha Cooper 08/11/2020, 1:03 PM

## 2020-08-11 NOTE — Progress Notes (Signed)
PROGRESS NOTE    [name] [name]  XBD:[id number] DOB: Jan 25, 1930 DOA: 08/04/2020 PCP: [name], MD    Brief Narrative:  87 year old female with recently diagnosed colon cancer who underwent surgical resection on 7/7 and was recovering.  Patient does have history of paroxysmal A. fib, hypertension, diastolic heart failure.  Postoperatively patient complained of chest discomfort and had hypoxic episode.  She was found to be in and out of A. fib as well fluid overload so medicine was consulted.  Assessment & Plan:   Principal Problem:   Cancer of ascending colon s/p lap colectomy 08/04/2020 Active Problems:   HTN (hypertension)   Permanent atrial fibrillation (HCC)   Iron deficiency anemia due to chronic blood loss   Chronic anticoagulation   Iron deficiency anemia   Constipation, chronic   HOH (hard of hearing)   Pressure injury of skin   Confusion, postoperative   Acute pulmonary edema (HCC)   GERD (gastroesophageal reflux disease)   Mitral regurgitation  Paroxysmal A. fib with RVR: aggravated by postoperative events and fluid imbalance.  Patient is in and out of the A. fib.  Blood pressures are stable.  Patient was on amiodarone 200 mg daily at home, she had interruption in therapy. Started on amiodarone 200 mg twice a day, heart rate is still not optimally controlled.  She is allergic to beta-blockers. Discussed case with cardiology, recommended loading with amiodarone 400 mg twice daily for 1 week and then go back on her home doses.  Will change to 400 mg twice daily today. Allergy to beta-blockers.  Cardizem not optimal treatment with low ejection fraction. Patient is already on Xarelto that is continued.  Acute systolic congestive heart failure: Probably aggravated by stress and tachycardia. Chest pain and hypoxia improved.  Mildly elevated troponins. Patient is euvolemic and on room air now. Changing to oral Lasix.  Will change to Lasix 40 mg twice a day today.   Tolerating low-dose losartan 25 mg. Allergy to beta-blockers. Discussed with cardiology, she is too frail and debilitated to undergo ischemia evaluation. Will refer to her cardiology at [facility] for follow-up after discharge. Currently optimized volume status.  Hypertension: Blood pressures fairly stable.  Discontinue verapamil as starting losartan for congestive heart failure.  Blood pressures optimally controlled.    Colon adenocarcinoma: Status post surgical resection.  Plan as per surgery.  Stable as per surgery.  Now tolerating regular diet. Pain is controlled. Lymph node biopsy is pending, surgery will follow up.   Patient is medically stabilizing.  Can transfer to telemetry bed.  She can be transferred to skilled nursing rehab when bed is available. Her son is working on finding bed [facility], I advised him to also to look for backup plans.   DVT prophylaxis: SCD's Start: 08/04/20 1600 Place TED hose Start: 08/04/20 1600 Rivaroxaban (XARELTO) tablet 15 mg   Code Status: Full code Family Communication: Patient's son on the phone. Disposition Plan: Status is: Inpatient  Remains inpatient appropriate because:Inpatient level of care appropriate due to severity of illness  Dispo: The patient is from: Home              Anticipated d/c is to: SNF              Patient currently is medically stable.   Difficult to place patient No      Consultants:  General surgery.  Procedures:  Right hemicolectomy 7/7  Antimicrobials:  None   Subjective: Patient seen and examined.  No overnight events.  Slept well.  Denies any chest pain or shortness of breath.  Telemetry shows heart rate as high as 130 when she worked with therapies.  Patient herself denies any palpitations. Wants to go home, however realizes that she may need some rehab. Called and discussed with her son on the phone.  Objective: Vitals:   08/11/20 0500 08/11/20 0700 08/11/20 0800 08/11/20 0900  BP:  (!)  130/108 (!) 152/98 (!) 150/71  Pulse:  (!) 47 (!) 107 (!) 108  Resp:  18 18 18   Temp:   98.3 F (36.8 C)   TempSrc:   Oral   SpO2:  96% 99% 97%  Weight: 56.3 kg     Height:        Intake/Output Summary (Last 24 hours) at 08/11/2020 1123 Last data filed at 08/10/2020 2000 Gross per 24 hour  Intake 170 ml  Output 400 ml  Net -230 ml   Filed Weights   08/09/20 0605 08/10/20 0500 08/11/20 0500  Weight: 53.6 kg 54.6 kg 56.3 kg    Examination:  General exam: Appears calm and comfortable  Frail and debilitated, however comfortable and pleasant.  On room air today.  Awake and eating. Respiratory system: Some conducted airway sounds.  Otherwise mostly clear.   Cardiovascular system: S1 & S2 heard, tachycardic.  Irregularly irregular.  No edema. Gastrointestinal system: Soft.  Bowel sounds present.  She has midline surgical incision which looks clean and dry.  Staples intact. Central nervous system: Alert and oriented. No focal neurological deficits.  Data Reviewed: I have personally reviewed following labs and imaging studies  CBC: Recent Labs  Lab 08/05/20 0425 08/06/20 0404 08/07/20 0457 08/09/20 0245  WBC 12.5* 9.1 12.2* 11.7*  NEUTROABS  --   --   --  7.3  HGB 9.1* 8.9* 10.8* 10.4*  HCT 29.4* 29.8* 35.8* 34.4*  MCV 92.5 95.5 94.2 93.7  PLT 217 289 395 062   Basic Metabolic Panel: Recent Labs  Lab 08/05/20 0425 08/06/20 0404 08/07/20 0457 08/08/20 0838 08/09/20 0245  NA 139 138 141 141 138  K 3.2* 2.8* 4.2 3.6 3.7  CL 105 103 106 104 103  CO2 24 28 26 26 25   GLUCOSE 109* 82 127* 113* 117*  BUN 12 18 19 20  30*  CREATININE 0.79 0.72 0.69 0.66 0.77  CALCIUM 8.7* 8.7* 9.0 9.2 9.1  MG  --   --  2.0  --   --   PHOS  --   --  2.7  --   --    GFR: Estimated Creatinine Clearance: 39.4 mL/min (by C-G formula based on SCr of 0.77 mg/dL). Liver Function Tests: No results for input(s): AST, ALT, ALKPHOS, BILITOT, PROT, ALBUMIN in the last 168 hours. No results for  input(s): LIPASE, AMYLASE in the last 168 hours. No results for input(s): AMMONIA in the last 168 hours. Coagulation Profile: No results for input(s): INR, PROTIME in the last 168 hours. Cardiac Enzymes: No results for input(s): CKTOTAL, CKMB, CKMBINDEX, TROPONINI in the last 168 hours. BNP (last 3 results) No results for input(s): PROBNP in the last 8760 hours. HbA1C: No results for input(s): HGBA1C in the last 72 hours. CBG: Recent Labs  Lab 08/06/20 2350  GLUCAP 135*   Lipid Profile: No results for input(s): CHOL, HDL, LDLCALC, TRIG, CHOLHDL, LDLDIRECT in the last 72 hours. Thyroid Function Tests: No results for input(s): TSH, T4TOTAL, FREET4, T3FREE, THYROIDAB in the last 72 hours. Anemia Panel: No results for input(s): VITAMINB12, FOLATE, FERRITIN, TIBC,  IRON, RETICCTPCT in the last 72 hours. Sepsis Labs: No results for input(s): PROCALCITON, LATICACIDVEN in the last 168 hours.  Recent Results (from the past 240 hour(s))  SARS CORONAVIRUS 2 (TAT 6-24 HRS) Nasopharyngeal Nasopharyngeal Swab     Status: None   Collection Time: 08/02/20  1:23 PM   Specimen: Nasopharyngeal Swab  Result Value Ref Range Status   SARS Coronavirus 2 NEGATIVE NEGATIVE Final    Comment: (NOTE) SARS-CoV-2 target nucleic acids are NOT DETECTED.  The SARS-CoV-2 RNA is generally detectable in upper and lower respiratory specimens during the acute phase of infection. Negative results do not preclude SARS-CoV-2 infection, do not rule out co-infections with other pathogens, and should not be used as the sole basis for treatment or other patient management decisions. Negative results must be combined with clinical observations, patient history, and epidemiological information. The expected result is Negative.  Fact Sheet for Patients: SugarRoll.be  Fact Sheet for Healthcare Providers: https://www.woods-mathews.com/  This test is not yet approved or cleared  by the [place] FDA and  has been authorized for detection and/or diagnosis of SARS-CoV-2 by FDA under an Emergency Use Authorization (EUA). This EUA will remain  in effect (meaning this test can be used) for the duration of the COVID-19 declaration under Se ction 564(b)(1) of the Act, 21 U.S.C. section 360bbb-3(b)(1), unless the authorization is terminated or revoked sooner.  Performed at [facility], [place] [address], [place], [place] [address]   MRSA Next Gen by PCR, Nasal     Status: None   Collection Time: 08/07/20  3:31 AM   Specimen: Nasal Mucosa; Nasal Swab  Result Value Ref Range Status   MRSA by PCR Next Gen NOT DETECTED NOT DETECTED Final    Comment: (NOTE) The GeneXpert MRSA Assay (FDA approved for NASAL specimens only), is one component of a comprehensive MRSA colonization surveillance program. It is not intended to diagnose MRSA infection nor to guide or monitor treatment for MRSA infections. Test performance is not FDA approved in patients less than 21 years old. Performed at [facility], [place] [address], [place], [place] [address]          Radiology Studies: No results found.      Scheduled Meds:  acetaminophen  1,000 mg Oral Q6H   amiodarone  400 mg Oral BID   Chlorhexidine Gluconate Cloth  6 each Topical Q0600   feeding supplement  237 mL Oral BID BM   fluticasone  1 spray Each Nare Daily   furosemide  40 mg Oral BID   iron polysaccharides  150 mg Oral BID   lip balm  1 application Topical BID   losartan  25 mg Oral Daily   melatonin  3 mg Oral QHS   multivitamin with minerals  1 tablet Oral Daily   pantoprazole  40 mg Oral BID AC   polycarbophil  625 mg Oral BID   potassium chloride  40 mEq Oral Daily   rivaroxaban  15 mg Oral Q supper   Continuous Infusions:   LOS: 7 days   Time spent: 30 minutes    Barb Merino, MD [facility] Pager [phone]

## 2020-08-12 DIAGNOSIS — I5043 Acute on chronic combined systolic (congestive) and diastolic (congestive) heart failure: Secondary | ICD-10-CM

## 2020-08-12 LAB — CBC WITH DIFFERENTIAL/PLATELET
Abs Immature Granulocytes: 0.09 10*3/uL — ABNORMAL HIGH (ref 0.00–0.07)
Basophils Absolute: 0.1 10*3/uL (ref 0.0–0.1)
Basophils Relative: 1 %
Eosinophils Absolute: 0.9 10*3/uL — ABNORMAL HIGH (ref 0.0–0.5)
Eosinophils Relative: 7 %
HCT: 35.3 % — ABNORMAL LOW (ref 36.0–46.0)
Hemoglobin: 10.5 g/dL — ABNORMAL LOW (ref 12.0–15.0)
Immature Granulocytes: 1 %
Lymphocytes Relative: 16 %
Lymphs Abs: 2 10*3/uL (ref 0.7–4.0)
MCH: 28.2 pg (ref 26.0–34.0)
MCHC: 29.7 g/dL — ABNORMAL LOW (ref 30.0–36.0)
MCV: 94.9 fL (ref 80.0–100.0)
Monocytes Absolute: 1.4 10*3/uL — ABNORMAL HIGH (ref 0.1–1.0)
Monocytes Relative: 11 %
Neutro Abs: 8 10*3/uL — ABNORMAL HIGH (ref 1.7–7.7)
Neutrophils Relative %: 64 %
Platelets: 355 10*3/uL (ref 150–400)
RBC: 3.72 MIL/uL — ABNORMAL LOW (ref 3.87–5.11)
RDW: 17.4 % — ABNORMAL HIGH (ref 11.5–15.5)
WBC: 12.4 10*3/uL — ABNORMAL HIGH (ref 4.0–10.5)
nRBC: 0 % (ref 0.0–0.2)

## 2020-08-12 LAB — BASIC METABOLIC PANEL
Anion gap: 10 (ref 5–15)
BUN: 28 mg/dL — ABNORMAL HIGH (ref 8–23)
CO2: 23 mmol/L (ref 22–32)
Calcium: 8.9 mg/dL (ref 8.9–10.3)
Chloride: 104 mmol/L (ref 98–111)
Creatinine, Ser: 0.88 mg/dL (ref 0.44–1.00)
GFR, Estimated: >60 mL/min (ref >60)
Glucose, Bld: 109 mg/dL — ABNORMAL HIGH (ref 70–99)
Potassium: 4.7 mmol/L (ref 3.5–5.1)
Sodium: 137 mmol/L (ref 135–145)

## 2020-08-12 LAB — PHOSPHORUS: Phosphorus: 4 mg/dL (ref 2.5–4.6)

## 2020-08-12 LAB — MAGNESIUM: Magnesium: 2 mg/dL (ref 1.7–2.4)

## 2020-08-12 MED ORDER — POTASSIUM CHLORIDE 20 MEQ PO PACK
20.0000 meq | PACK | Freq: Every day | ORAL | Status: DC
  Administered 2020-08-13 – 2020-08-16 (×4): 20 meq via ORAL
  Filled 2020-08-12 (×4): qty 1

## 2020-08-12 MED ORDER — BISOPROLOL FUMARATE 5 MG PO TABS
5.0000 mg | ORAL_TABLET | Freq: Every day | ORAL | Status: DC
  Administered 2020-08-12 – 2020-08-16 (×5): 5 mg via ORAL
  Filled 2020-08-12 (×5): qty 1

## 2020-08-12 NOTE — Consult Note (Addendum)
Cardiology Consultation:   Patient ID: [name] [name] MRN: [id number]; DOB: 1929/09/30  Admit date: 08/04/2020 Date of Consult: 08/12/2020  PCP:  [name], MD   [facility] Providers Cardiologist:  Hilda Lias / Paul Half here to update MD or APP on Care Team, Refresh:1}     Patient Profile:   [name] [name] is a 87 y.o. female with a hx of atrial fibrillation, anxiety, diverticular disease, HTN, hemorrhoids, HLD, osteopenia who is being seen 08/12/2020 for the evaluation of atrial fib at the request of Dr. Sloan Leiter.  History of Present Illness:   [name] [name] was previously seen in consult by [facility] cardiology in 07/2019 during admission for unsteadiness, fall, and pelvic fractures when she was found to have atrial fib. She was treated with amiodarone and beta blocker at that time with spontaneous conversion to NSR. She was also started on anticoagulation. She has had subsequent admissions for cellulitis and iron deficiency anemia requiring PRBC/iron tranfusion (12/2019) and acute respiratory failure with hypoxia requiring Lasix (12/2019). During those admissions she had issues with AF RVR at which time she had not been taking medications as prescribed, so regimen was adjusted and digoxin was added. 2D Echo 03/2020 showed normal LVEF. She was readmitted 03/2020 with worsening anemia Hgb 6.5, acute hypoxic respiratory failure, CHF exacerbation, pleural effusions requiring bilateral thoracentesis, and lung nodule.   She was referred to GI and ultimately had diagnosis of adenocarcinoma of colon. She underwent lab colectomy 08/04/20. During admission she has been reported to go in and out of atrial fib, as well as with chest pressure associated with hypoxia/CHF exacerbation and hypoxia treated with IV Lasix with improvement. hsTroponins 149->140 during admission, BNP 2411. 2D echo 08/08/20 showed new decrease in LV function to 30-35% (in AF RVR during study), global  hypokinesis that appears slightly worse in the basal-to-apical inferoseptal and basal-to-mid anteroseptal segments, mild asymmetric LV hypertrophy of basal septal segment, moderately reduced RV function, severely dilated LA, moderate left pleural effusion, moderate mitral regurgitation, mild aortic sclerosis without stenosis. She is still having issues with episodic AF RVR, so cardiology was consulted. She came into the hospital on amiodarone 200mg  daily and verapamil 180mg  daily, no anticoag. During admission her home dose of amiodarone 200mg  daily was increased to 200mg  BID on 7/12 then 400mg  BID on 7/14 after IM's discussion with cardiology (unsure if [name] or [place]). She was reported to have "allergy" to beta blockers having developed swelling of feet, ankle and legs on metoprolol - question now whether that represented CHF rather than true allergy. Xarelto has been resumed and losartan started for LV dysfunction. Pathology thus far showed cancer to be metastatic to 4 of 21 lymph nodes.  EKG 08/07/20 showed NSR, so Afib is known to be paroxysmal, not permanent as previously outlined in other admissions. QT prolongation also noted on subsequent tracings.   Past Medical History:  Diagnosis Date   Anemia    Anxiety    Cancer ([place])    Squamous Cell Carcinoma (top of head)   Constipation, chronic    Deaf, left    Diverticular disease    GERD (gastroesophageal reflux disease)    Hearing aid worn    Hearing deficit    History of Bell's palsy    left side - resolved - 50 yrs ago   Hypertension    Internal hemorrhoids    Iron deficiency anemia    Mitral regurgitation    moderate to severe on 03/27/20 echo   Osteopenia  Pure hypercholesterolemia     Past Surgical History:  Procedure Laterality Date   CATARACT EXTRACTION W/PHACO Left 11/16/2015   Procedure: CATARACT EXTRACTION PHACO AND INTRAOCULAR LENS PLACEMENT (IOC);  Surgeon: Leandrew Koyanagi, MD;  Location: [place];   Service: Ophthalmology;  Laterality: Left;  TORIC   CATARACT EXTRACTION W/PHACO Right 12/07/2015   Procedure: CATARACT EXTRACTION PHACO AND INTRAOCULAR LENS PLACEMENT (IOC);  Surgeon: Leandrew Koyanagi, MD;  Location: [place];  Service: Ophthalmology;  Laterality: Right;  TORIC   COLONOSCOPY WITH PROPOFOL N/A 08/10/2014   Procedure: COLONOSCOPY WITH PROPOFOL;  Surgeon: Lollie Sails, MD;  Location: [facility] ENDOSCOPY;  Service: Endoscopy;  Laterality: N/A;   COLONOSCOPY WITH PROPOFOL N/A 05/12/2020   Procedure: COLONOSCOPY WITH PROPOFOL;  Surgeon: Lin Landsman, MD;  Location: [facility] ENDOSCOPY;  Service: Gastroenterology;  Laterality: N/A;   ESOPHAGOGASTRODUODENOSCOPY (EGD) WITH PROPOFOL N/A 03/28/2020   Procedure: ESOPHAGOGASTRODUODENOSCOPY (EGD) WITH PROPOFOL;  Surgeon: Lucilla Lame, MD;  Location: [facility] ENDOSCOPY;  Service: Endoscopy;  Laterality: N/A;   EYE SURGERY     FEMUR FRACTURE SURGERY     FRACTURE SURGERY     GIVENS CAPSULE STUDY N/A 04/19/2020   Procedure: GIVENS CAPSULE STUDY;  Surgeon: Lin Landsman, MD;  Location[facility] ENDOSCOPY;  Service: Gastroenterology;  Laterality: N/A;   LAPAROSCOPIC RIGHT HEMI COLECTOMY Right 08/04/2020   Procedure: LAPAROSCOPIC RIGHT HEMI COLECTOMY;  Surgeon: [name], MD;  Location: [facility];  Service: General;  Laterality: Right;   Nasal Polyp Removal       Home Medications:  Prior to Admission medications   Medication Sig Start Date End Date Taking? Authorizing Provider  acetaminophen (TYLENOL) 500 MG tablet Take 1 tablet (500 mg total) by mouth every 6 (six) hours as needed. Patient taking differently: Take 1,000 mg by mouth every 6 (six) hours as needed for moderate pain. 04/02/20  Yes Enzo Bi, MD  amiodarone (PACERONE) 200 MG tablet Take 1 tablet (200 mg total) by mouth daily. 01/17/20  Yes Nicole Kindred A, DO  calcium carbonate (TUMS - DOSED IN MG ELEMENTAL CALCIUM) 500 MG chewable tablet Chew 500 mg by mouth daily as  needed for indigestion or heartburn.   Yes [provider]  fluticasone (FLONASE) 50 MCG/ACT nasal spray Place 1 spray into both nostrils daily.   Yes [provider]  iron polysaccharides (NIFEREX) 150 MG capsule Take 1 capsule (150 mg total) by mouth daily. Patient taking differently: Take 150 mg by mouth 2 (two) times daily. 01/03/20  Yes Sharen Hones, MD  lovastatin (MEVACOR) 40 MG tablet Take 40 mg by mouth every evening.   Yes [provider]  Multiple Vitamin (MULTIVITAMIN WITH MINERALS) TABS tablet Take 1 tablet by mouth daily.   Yes [provider]  Multiple Vitamins-Minerals (HAIR/SKIN/NAILS) CAPS Take 3 capsules by mouth daily.   Yes [provider]  pantoprazole (PROTONIX) 40 MG tablet Take 1 tablet (40 mg total) by mouth 2 (two) times daily before a meal. Patient taking differently: Take 40 mg by mouth 2 (two) times daily as needed (acid reflux). 04/02/20 08/04/20 Yes Enzo Bi, MD  Polyethyl Glycol-Propyl Glycol (SYSTANE OP) Place 1 drop into both eyes in the morning and at bedtime.   Yes [provider]  polyethylene glycol powder (GLYCOLAX/MIRALAX) 17 GM/SCOOP powder Take 17 g by mouth 2 (two) times daily as needed for moderate constipation.   Yes [provider]  traMADol (ULTRAM) 50 MG tablet Take 1 tablet (50 mg total) by mouth every  6 (six) hours as needed for up to 5 days. 08/07/20 08/12/20 Yes [name], MD  tretinoin (RETIN-A) 0.05 % cream Apply 1 application topically at bedtime. 07/19/20  Yes [provider]  verapamil (CALAN-SR) 180 MG CR tablet Take 180 mg by mouth daily. 09/25/19 09/24/20 Yes [provider]  furosemide (LASIX) 40 MG tablet Take 1 tablet (40 mg total) by mouth daily. Patient not taking: No sig reported 01/17/20   Ezekiel Slocumb, DO    Inpatient Medications: Scheduled Meds:  amiodarone  400 mg Oral BID   Chlorhexidine Gluconate Cloth  6 each Topical Q0600   feeding  supplement  237 mL Oral BID BM   fluticasone  1 spray Each Nare Daily   furosemide  40 mg Oral BID   iron polysaccharides  150 mg Oral BID   lip balm  1 application Topical BID   losartan  25 mg Oral Daily   melatonin  3 mg Oral QHS   multivitamin with minerals  1 tablet Oral Daily   pantoprazole  40 mg Oral BID AC   polycarbophil  625 mg Oral BID   potassium chloride  40 mEq Oral Daily   rivaroxaban  15 mg Oral Q supper   Continuous Infusions:  PRN Meds: alum & mag hydroxide-simeth, calcium carbonate, diphenhydrAMINE **OR** [DISCONTINUED] diphenhydrAMINE, magic mouthwash, menthol-cetylpyridinium, ondansetron **OR** ondansetron (ZOFRAN) IV, phenol, simethicone, traMADol, traZODone  Allergies:    Allergies  Allergen Reactions   Metoprolol Swelling    Feet, ankles and legs    Bactrim [Sulfamethoxazole-Trimethoprim] Other (See Comments)    Hearing loss   Ciprofloxacin Other (See Comments)    Flu-like symptoms   Pneumococcal Vaccines Other (See Comments)    Flu-like symptoms    Social History:   Social History   Socioeconomic History   Marital status: Widowed    Spouse name: Not on file   Number of children: Not on file   Years of education: Not on file   Highest education level: Not on file  Occupational History   Not on file  Tobacco Use   Smoking status: Former    Types: Cigarettes    Quit date: 10/30/1985    Years since quitting: 34.8   Smokeless tobacco: Never  Vaping Use   Vaping Use: Never used  Substance and Sexual Activity   Alcohol use: No   Drug use: No   Sexual activity: Not on file  Other Topics Concern   Not on file  Social History Narrative   Not on file   Social Determinants of Health   Financial Resource Strain: Not on file  Food Insecurity: Not on file  Transportation Needs: Not on file  Physical Activity: Not on file  Stress: Not on file  Social Connections: Not on file  Intimate Partner Violence: Not on file    Family History:     Family History  Problem Relation Age of Onset   Colon cancer Mother      ROS:  Please see the history of present illness.   All other ROS reviewed and negative.     Physical Exam/Data:   Vitals:   08/11/20 1456 08/11/20 1741 08/11/20 2340 08/12/20 0310  BP: (!) 105/53 (!) 114/99 121/76 132/90  Pulse: 76 (!) 101 86 97  Resp: 16 18 16 16   Temp: 98.9 F (37.2 C) 97.6 F (36.4 C) 97.7 F (36.5 C) 98.2 F (36.8 C)  TempSrc: Oral Oral Oral Oral  SpO2: 96% 99% 98% 97%  Weight:      Height:        Intake/Output Summary (Last 24 hours) at 08/12/2020 0855 Last data filed at 08/12/2020 0600 Gross per 24 hour  Intake 360 ml  Output 700 ml  Net -340 ml   Last 3 Weights 08/11/2020 08/10/2020 08/09/2020  Weight (lbs) 124 lb 1.9 oz 120 lb 5.9 oz 118 lb 2.7 oz  Weight (kg) 56.3 kg 54.6 kg 53.6 kg     Body mass index is 21.99 kg/m.  Exam per mD  EKG:  The EKG was personally reviewed and demonstrates:  atrial fib RVR 140bpm with nonspecific TW changes and QT prolongation   Telemetry:  Telemetry was personally reviewed and demonstrates:  review per MD  Relevant CV Studies: 2D echo 08/08/20    1. Patient is in Afib with RVR during study.   2. Left ventricular ejection fraction, by estimation, is 30 to 35%. The  left ventricle has moderately decreased function. There is global  hypokinesis that appears slightly worse in the basal-to-apical  inferoseptal and basal-to-mid anteroseptal segments.   There is mild asymmetric left ventricular hypertrophy of the basal-septal  segment. Diastolic function is indeterminant due to atrial fibrillation.   3. Right ventricular systolic function is moderately reduced. The right  ventricular size is normal. There is normal pulmonary artery systolic  pressure. The estimated right ventricular systolic pressure is 67.2 mmHg.   4. Left atrial size was severely dilated.   5. Right atrial size was mildly dilated.   6. Moderate pleural effusion in  the left lateral region.   7. The mitral valve is myxomatous. Moderate mitral valve regurgitation.   8. The aortic valve is tricuspid. There is mild calcification of the  aortic valve. There is mild thickening of the aortic valve. Aortic valve  regurgitation is trivial. Mild aortic valve sclerosis is present, with no  evidence of aortic valve stenosis.   9. The inferior vena cava is normal in size with greater than 50%  respiratory variability, suggesting right atrial pressure of 3 mmHg.   Comparison(s): Compared to prior TTE in 03/27/2020, the LVEF has dropped  to 30-35% (previously 60-65%) in the setting of Afib with RVR. The mitral  valve thickening is better appreciated on prior study. There continues to  be moderate MR and a pleural  effusion.   Laboratory Data:  High Sensitivity Troponin:   Recent Labs  Lab 08/07/20 1642 08/07/20 1900  TROPONINIHS 149* 140*     Chemistry Recent Labs  Lab 08/08/20 0838 08/09/20 0245 08/12/20 0505  NA 141 138 137  K 3.6 3.7 4.7  CL 104 103 104  CO2 26 25 23   GLUCOSE 113* 117* 109*  BUN 20 30* 28*  CREATININE 0.66 0.77 0.88  CALCIUM 9.2 9.1 8.9  GFRNONAA >60 >60 >60  ANIONGAP 11 10 10     No results for input(s): PROT, ALBUMIN, AST, ALT, ALKPHOS, BILITOT in the last 168 hours. Hematology Recent Labs  Lab 08/07/20 0457 08/09/20 0245 08/12/20 0505  WBC 12.2* 11.7* 12.4*  RBC 3.80* 3.67* 3.72*  HGB 10.8* 10.4* 10.5*  HCT 35.8* 34.4* 35.3*  MCV 94.2 93.7 94.9  MCH 28.4 28.3 28.2  MCHC 30.2 30.2 29.7*  RDW 17.5* 17.6* 17.4*  PLT 395 377 355   BNP Recent Labs  Lab 08/07/20 0457  BNP 2,411.8*    DDimer No results for input(s): DDIMER in the last 168 hours.   Radiology/Studies:  No results found.   Assessment and Plan:  1. Cancer of ascending colon s/p lap colectomy - recent admission for symptomatic anemia requiring transfusion - s/p lap colectomy 08/04/20 - prelim path 4/21 + lymph nodes - further management per  primary/surgical teams  2. Paroxysmal atrial fibrillation - current rx includes amiodarone 400mg  BID (with MAR plans to continue through 7/19) - verapamil discontinued 2/2 LV dysfunction - would avoid digoxin going forward if possible given interaction with amiodarone and advanced age - Xarelto dose appropriate for CrCl 16ml/min - will discuss the issue of possible BB allergy with MD  3. QT prolongation by EKG - MD to review latest QT interval on telemetry and advise on amiodarone dose - lytes WNL  4. Acute combined CHF + moderate mitral regurgitation - previously had diastolic CHF with normal EF in 03/2020, but echo this admission shows definitive drop in LV function albeit in AF RVR at the time - see below re: ischemic eval - started on losartan, currently rx with Lasix 40mg  BID - see above re: BB - will d/w MD  5. Chest pain/elevated troponin - troponin was low/flat during admission not acutely suggestive of ACS - symptoms were associated with feeling like she had fluid on board, so may have been due to CHF - with drop in LV function cannot fully exclude underlying CAD at this time but given advanced age, newly evolving cancer picture, recent symptomatic anemia requiring transfusion, suspect conservative management is appropriate with outpatient follow-up - will d/w MD  Will need f/u with [facility] after DC.   Risk Assessment/Risk Scores:     HEAR Score (for undifferentiated chest pain):  HEAR Score: 5  New York Heart Association (NYHA) Functional Class NYHA Class Varying this adm from II-IV  CHA2DS2-VASc Score = 6  This indicates a 9.7% annual risk of stroke. The patient's score is based upon: CHF History: Yes HTN History: Yes Diabetes History: No Stroke History: No Vascular Disease History: Yes Age Score: 2 Gender Score: 1      For questions or updates, please contact [place] Please consult [email or web address] for contact info under    Signed, Charlie Pitter, PA-C prepped remotely/HAW 08/12/2020 8:55 AM

## 2020-08-12 NOTE — Progress Notes (Signed)
Subjective Transferred to tele yesterday and doing well. She feels well, no n/v. Tolerating diet without issue and having flatus. She continues to deny any abdominal pain. Denies chest pain or SOB at this point.  Objective: Vital signs in last 24 hours: Temp:  [97.6 F (36.4 C)-98.9 F (37.2 C)] 98.2 F (36.8 C) (07/15 0310) Pulse Rate:  [76-101] 97 (07/15 0310) Resp:  [16-23] 16 (07/15 0310) BP: (76-132)/(27-99) 132/90 (07/15 0310) SpO2:  [96 %-99 %] 97 % (07/15 0310) Last BM Date: 08/10/20  Intake/Output from previous day: 07/14 0701 - 07/15 0700 In: 360 [P.O.:360] Out: 700 [Urine:700] Intake/Output this shift: Total I/O In: 240 [P.O.:240] Out: 600 [Urine:600]  Gen: NAD, comfortable, sleepy but arouses to voice CV: irreg rate/rhythm Pulm: Normal work of breathing Abd: Soft, NT/ND. Incisions c/d/i without erythema or drainage. No palpable hernias Ext: SCDs in place  Lab Results: CBC  Recent Labs    08/12/20 0505  WBC 12.4*  HGB 10.5*  HCT 35.3*  PLT 355   BMET Recent Labs    08/12/20 0505  NA 137  K 4.7  CL 104  CO2 23  GLUCOSE 109*  BUN 28*  CREATININE 0.88  CALCIUM 8.9   PT/INR No results for input(s): LABPROT, INR in the last 72 hours. ABG No results for input(s): PHART, HCO3 in the last 72 hours.  Invalid input(s): PCO2, PO2   Studies/Results:  Anti-infectives: Anti-infectives (From admission, onward)    Start     Dose/Rate Route Frequency Ordered Stop   08/04/20 1400  neomycin (MYCIFRADIN) tablet 1,000 mg  Status:  Discontinued       See Hyperspace for full Linked Orders Report.   1,000 mg Oral 3 times per day 08/04/20 1043 08/04/20 1048   08/04/20 1400  metroNIDAZOLE (FLAGYL) tablet 1,000 mg  Status:  Discontinued       See Hyperspace for full Linked Orders Report.   1,000 mg Oral 3 times per day 08/04/20 1043 08/04/20 1048   08/04/20 0600  cefoTEtan (CEFOTAN) 2 g in sodium chloride 0.9 % 100 mL IVPB        2 g 200 mL/hr over 30  Minutes Intravenous On call to O.R. 08/03/20 0743 08/04/20 1256        Assessment/Plan: Patient Active Problem List   Diagnosis Date Noted   Pressure injury of skin 08/07/2020   Confusion, postoperative 08/07/2020   Acute pulmonary edema ([place]) 08/07/2020   Diverticular disease 08/07/2020   GERD (gastroesophageal reflux disease) 08/07/2020   History of Bell's palsy 08/07/2020   Mitral regurgitation 08/07/2020   Cancer of ascending colon s/p lap colectomy 08/04/2020 06/01/2020   Colonic mass    Closed intertrochanteric fracture ([place]) 04/12/2020   Colles' fracture 04/12/2020   Osteoarthritis of knee 04/12/2020   Symptomatic anemia 03/25/2020   HLD (hyperlipidemia) 03/25/2020   Bilateral leg edema 02/04/2020   Rapid atrial fibrillation ([place]) 01/11/2020   Chronic anticoagulation 01/11/2020   Chronic ulcer of left leg ([place]) 01/11/2020   Iron deficiency anemia 01/11/2020   Hypokalemia 01/01/2020   Left leg cellulitis 01/01/2020   Iron deficiency anemia due to chronic blood loss 01/01/2020   Wound cellulitis 12/31/2019   Aortic atherosclerosis ([place]) 09/25/2019   HOH (hard of hearing) 08/31/2019   History of pelvic fracture 08/31/2019   Pelvic fracture ([place]) 08/25/2019   Permanent atrial fibrillation ([place]) 08/25/2019   Pain    Dehydration    Accidental fall 07/26/2019   UTI (urinary tract infection) 07/26/2019   Closed  fracture of right inferior pubic ramus ([place]) 07/26/2019   HTN (hypertension) 07/26/2019   Ambulatory dysfunction 07/26/2019   Pubic bone fracture ([place]) 07/26/2019   History of colon polyps 06/25/2017   Constipation, chronic 07/27/2013   Osteopenia 07/27/2013   s/p Procedure(s): LAPAROSCOPIC RIGHT HEMI COLECTOMY 08/04/2020  -Greatly appreciate medicine's assistance and involvement in her care -Clinically she continues to do reasonably well -Continue soft diet as tolerated -Working with therapies -Ppx: PO anticoagulation , SCDs; on BID PPI currently -Dispo:  Planning SNF as per PT/OT once cleared by medicine for discharge   LOS: 8 days   Nadeen Landau, MD [facility] Use [email or web address] to contact on call provider

## 2020-08-12 NOTE — Progress Notes (Signed)
PROGRESS NOTE    VICKI CHAFFIN  AYT:[id number] DOB: [birth year] DOA: 08/04/2020 PCP: [name], MD    Brief Narrative:  87 year old female with recently diagnosed colon cancer who underwent surgical resection on 7/7 and was recovering.  Patient does have history of paroxysmal A. fib, hypertension, diastolic heart failure.  Postoperatively patient complained of chest discomfort and had hypoxic episode.  She was found to be in and out of A. fib as well fluid overload so medicine was consulted.  Assessment & Plan:   Principal Problem:   Cancer of ascending colon s/p lap colectomy 08/04/2020 Active Problems:   HTN (hypertension)   Permanent atrial fibrillation (HCC)   Iron deficiency anemia due to chronic blood loss   Chronic anticoagulation   Iron deficiency anemia   Constipation, chronic   HOH (hard of hearing)   Pressure injury of skin   Confusion, postoperative   Acute pulmonary edema (HCC)   GERD (gastroesophageal reflux disease)   Mitral regurgitation  Paroxysmal A. fib with RVR: aggravated by postoperative events and fluid imbalance.  Patient is in and out of the A. fib.  Blood pressures are stable.  On loading dose of amiodarone 400 mg twice a day for 1 week.  Still with difficulty controlling heart rate. Previously discussed with cardiology. Requested consultation.  Vague allergy to beta-blocker, challenging with bisoprolol today. Patient is already on Xarelto that is continued.  Acute systolic congestive heart failure: Probably aggravated by stress and tachycardia. Chest pain and hypoxia improved.  Mildly elevated troponins. Patient is euvolemic and on room air now. Changed to oral Lasix.  Tolerating low-dose losartan 25 mg. Started on bisoprolol. Discussed with cardiology, she is too frail and debilitated to undergo ischemia evaluation. Will refer to her cardiology at [facility] for follow-up after discharge. Currently optimized volume  status.  Hypertension: Blood pressures fairly stable.  Discontinue verapamil as starting losartan for congestive heart failure.  Blood pressures optimally controlled.    Colon adenocarcinoma: Status post surgical resection.  Plan as per surgery.  Stable as per surgery.  Now tolerating regular diet. Pain is controlled. Lymph node biopsy is pending, surgery will follow up.   DVT prophylaxis: SCD's Start: 08/04/20 1600 Place TED hose Start: 08/04/20 1600 Rivaroxaban (XARELTO) tablet 15 mg   Code Status: Full code Family Communication: Patient's son on the phone 7/14. Disposition Plan: Status is: Inpatient  Remains inpatient appropriate because:Inpatient level of care appropriate due to severity of illness  Dispo: The patient is from: Home              Anticipated d/c is to: SNF              Patient currently is not medically stable.  Anticipate discharge tomorrow.   Difficult to place patient No      Consultants:  General surgery.  Procedures:  Right hemicolectomy 7/7  Antimicrobials:  None   Subjective: Seen and examined.  Patient herself denies any complaints.  Denies any chest pain or palpitation. Telemetry shows A. fib with occasional rapid rate up to 140-150.  Objective: Vitals:   08/11/20 1456 08/11/20 1741 08/11/20 2340 08/12/20 0310  BP: (!) 105/53 (!) 114/99 121/76 132/90  Pulse: 76 (!) 101 86 97  Resp: 16 18 16 16   Temp: 98.9 F (37.2 C) 97.6 F (36.4 C) 97.7 F (36.5 C) 98.2 F (36.8 C)  TempSrc: Oral Oral Oral Oral  SpO2: 96% 99% 98% 97%  Weight:      Height:  Intake/Output Summary (Last 24 hours) at 08/12/2020 1306 Last data filed at 08/12/2020 1133 Gross per 24 hour  Intake 600 ml  Output 1300 ml  Net -700 ml   Filed Weights   08/09/20 0605 08/10/20 0500 08/11/20 0500  Weight: 53.6 kg 54.6 kg 56.3 kg    Examination:  General exam: Appears calm and comfortable  Frail and debilitated, however comfortable and pleasant.  On room  air. Respiratory system: Some conducted airway sounds.  Otherwise mostly clear.   Cardiovascular system: S1 & S2 heard, tachycardic.  Irregularly irregular.  No edema. Gastrointestinal system: Soft.  Bowel sounds present.  She has midline surgical incision which looks clean and dry.  Staples intact. Central nervous system: Alert and oriented. No focal neurological deficits.  Data Reviewed: I have personally reviewed following labs and imaging studies  CBC: Recent Labs  Lab 08/06/20 0404 08/07/20 0457 08/09/20 0245 08/12/20 0505  WBC 9.1 12.2* 11.7* 12.4*  NEUTROABS  --   --  7.3 8.0*  HGB 8.9* 10.8* 10.4* 10.5*  HCT 29.8* 35.8* 34.4* 35.3*  MCV 95.5 94.2 93.7 94.9  PLT 289 395 377 235   Basic Metabolic Panel: Recent Labs  Lab 08/06/20 0404 08/07/20 0457 08/08/20 0838 08/09/20 0245 08/12/20 0505  NA 138 141 141 138 137  K 2.8* 4.2 3.6 3.7 4.7  CL 103 106 104 103 104  CO2 28 26 26 25 23   GLUCOSE 82 127* 113* 117* 109*  BUN 18 19 20  30* 28*  CREATININE 0.72 0.69 0.66 0.77 0.88  CALCIUM 8.7* 9.0 9.2 9.1 8.9  MG  --  2.0  --   --  2.0  PHOS  --  2.7  --   --  4.0   GFR: Estimated Creatinine Clearance: 35.9 mL/min (by C-G formula based on SCr of 0.88 mg/dL). Liver Function Tests: No results for input(s): AST, ALT, ALKPHOS, BILITOT, PROT, ALBUMIN in the last 168 hours. No results for input(s): LIPASE, AMYLASE in the last 168 hours. No results for input(s): AMMONIA in the last 168 hours. Coagulation Profile: No results for input(s): INR, PROTIME in the last 168 hours. Cardiac Enzymes: No results for input(s): CKTOTAL, CKMB, CKMBINDEX, TROPONINI in the last 168 hours. BNP (last 3 results) No results for input(s): PROBNP in the last 8760 hours. HbA1C: No results for input(s): HGBA1C in the last 72 hours. CBG: Recent Labs  Lab 08/06/20 2350  GLUCAP 135*   Lipid Profile: No results for input(s): CHOL, HDL, LDLCALC, TRIG, CHOLHDL, LDLDIRECT in the last 72  hours. Thyroid Function Tests: No results for input(s): TSH, T4TOTAL, FREET4, T3FREE, THYROIDAB in the last 72 hours. Anemia Panel: No results for input(s): VITAMINB12, FOLATE, FERRITIN, TIBC, IRON, RETICCTPCT in the last 72 hours. Sepsis Labs: No results for input(s): PROCALCITON, LATICACIDVEN in the last 168 hours.  Recent Results (from the past 240 hour(s))  SARS CORONAVIRUS 2 (TAT 6-24 HRS) Nasopharyngeal Nasopharyngeal Swab     Status: None   Collection Time: 08/02/20  1:23 PM   Specimen: Nasopharyngeal Swab  Result Value Ref Range Status   SARS Coronavirus 2 NEGATIVE NEGATIVE Final    Comment: (NOTE) SARS-CoV-2 target nucleic acids are NOT DETECTED.  The SARS-CoV-2 RNA is generally detectable in upper and lower respiratory specimens during the acute phase of infection. Negative results do not preclude SARS-CoV-2 infection, do not rule out co-infections with other pathogens, and should not be used as the sole basis for treatment or other patient management decisions. Negative results must be  combined with clinical observations, patient history, and epidemiological information. The expected result is Negative.  Fact Sheet for Patients: SugarRoll.be  Fact Sheet for Healthcare Providers: https://www.woods-mathews.com/  This test is not yet approved or cleared by the [place] FDA and  has been authorized for detection and/or diagnosis of SARS-CoV-2 by FDA under an Emergency Use Authorization (EUA). This EUA will remain  in effect (meaning this test can be used) for the duration of the COVID-19 declaration under Se ction 564(b)(1) of the Act, 21 U.S.C. section 360bbb-3(b)(1), unless the authorization is terminated or revoked sooner.  Performed at [facility], [place] [address], [place], [place] [address]   MRSA Next Gen by PCR, Nasal     Status: None   Collection Time: 08/07/20  3:31 AM   Specimen: Nasal Mucosa; Nasal Swab   Result Value Ref Range Status   MRSA by PCR Next Gen NOT DETECTED NOT DETECTED Final    Comment: (NOTE) The GeneXpert MRSA Assay (FDA approved for NASAL specimens only), is one component of a comprehensive MRSA colonization surveillance program. It is not intended to diagnose MRSA infection nor to guide or monitor treatment for MRSA infections. Test performance is not FDA approved in patients less than 85 years old. Performed at [facility], [place] [address] [place], [place] [address]          Radiology Studies: No results found.      Scheduled Meds:  amiodarone  400 mg Oral BID   bisoprolol  5 mg Oral Daily   Chlorhexidine Gluconate Cloth  6 each Topical Q0600   feeding supplement  237 mL Oral BID BM   fluticasone  1 spray Each Nare Daily   furosemide  40 mg Oral BID   iron polysaccharides  150 mg Oral BID   lip balm  1 application Topical BID   losartan  25 mg Oral Daily   melatonin  3 mg Oral QHS   multivitamin with minerals  1 tablet Oral Daily   pantoprazole  40 mg Oral BID AC   polycarbophil  625 mg Oral BID   [START ON 08/13/2020] potassium chloride  20 mEq Oral Daily   rivaroxaban  15 mg Oral Q supper   Continuous Infusions:   LOS: 8 days   Time spent: 30 minutes    Barb Merino, MD [facility] Pager [phone]

## 2020-08-12 NOTE — TOC Progression Note (Signed)
Transition of Care [facility]) - Progression Note    Patient Details  Name: [name] [name] MRN: [id number] Date of Birth: 04-06-1929  Transition of [facility]) CM/SW Contact  [name] [name], [name] Skiff, RN Phone Number: 08/12/2020, 12:59 PM  Clinical Narrative:    Spoke with son [name] [name] for continued dc planning. Per MD pt should be medically ready to dc tomorrow. [name] [name] was informed of this. [name] [name] really wants her to go to [facility] but they do not have bed availability until Monday. [name] [name] was asked to make a 2nd choice of SNF beds. His second choice is [facility]. Nikki from [facility] contacted to see if bed could be accepted for tomorrow. I was not allowed to accept bed for tomorrow we would need to call back on day of dc for available beds. TOC will continue to follow.   Expected Discharge Plan: [place] (vs. Home with home health) Barriers to Discharge: Continued Medical Work up  Expected Discharge Plan and Services Expected Discharge Plan: [place] (vs. Home with home health) In-house Referral: Clinical Social Work     Living arrangements for the past 2 months: Single Family Home                  Social Determinants of Health (SDOH) Interventions    Readmission Risk Interventions No flowsheet data found.

## 2020-08-13 MED ORDER — MELATONIN 3 MG PO TABS: 3.0000 mg | ORAL_TABLET | Freq: Every day | ORAL | 0 refills | Status: DC

## 2020-08-13 MED ORDER — POTASSIUM CHLORIDE 20 MEQ PO PACK: 20.0000 meq | PACK | Freq: Every day | ORAL | Status: DC

## 2020-08-13 MED ORDER — AMIODARONE HCL 200 MG PO TABS: 200.0000 mg | ORAL_TABLET | Freq: Every day | ORAL | 2 refills | Status: DC

## 2020-08-13 MED ORDER — AMIODARONE HCL 200 MG PO TABS: 200.0000 mg | ORAL_TABLET | Freq: Two times a day (BID) | ORAL | 2 refills | Status: DC

## 2020-08-13 MED ORDER — AMIODARONE HCL 200 MG PO TABS: 200.0000 mg | ORAL_TABLET | Freq: Two times a day (BID) | ORAL | 0 refills | Status: DC

## 2020-08-13 MED ORDER — RIVAROXABAN 15 MG PO TABS: 15.0000 mg | ORAL_TABLET | Freq: Every day | ORAL | Status: DC

## 2020-08-13 MED ORDER — BISOPROLOL FUMARATE 5 MG PO TABS: 5.0000 mg | ORAL_TABLET | Freq: Every day | ORAL | 0 refills | Status: DC

## 2020-08-13 MED ORDER — ADULT MULTIVITAMIN W/MINERALS CH: 1.0000 | ORAL_TABLET | Freq: Every day | ORAL | Status: DC

## 2020-08-13 MED ORDER — LOSARTAN POTASSIUM 25 MG PO TABS: 25.0000 mg | ORAL_TABLET | Freq: Every day | ORAL | 0 refills | Status: DC

## 2020-08-13 MED ORDER — POTASSIUM CHLORIDE 20 MEQ PO PACK: 20.0000 meq | PACK | Freq: Every day | ORAL | 0 refills | Status: DC

## 2020-08-13 NOTE — Discharge Summary (Addendum)
Physician Discharge Summary  [name] [name]:[id number] DOB: 1929/09/13 DOA: 08/04/2020  PCP: [name], MD  Admit date: 08/04/2020 Discharge date: 08/13/2020  Admitted From: Home Disposition: Skilled nursing facility  Recommendations for Outpatient Follow-up:  Follow up with PCP in 1-2 weeks Scheduled follow-up with cardiology after discharge Surgery will schedule follow-up.  Home Health: Not applicable Equipment/Devices: Not applicable  Discharge Condition: Stable CODE STATUS: Full code Diet recommendation: Low-salt diet  Discharge summary:  87 year old female with recently diagnosed colon cancer who underwent surgical resection on 7/7 and was recovering.  Patient does have history of paroxysmal A. fib, hypertension, diastolic heart failure.  Postoperatively patient complained of chest discomfort and had hypoxic episode.  She was found to be in and out of A. fib as well fluid overload so medicine was consulted.   Assessment & plan of care:    Paroxysmal A. fib with RVR: aggravated by postoperative events and fluid imbalance. Her heart rate control was challenging. As per cardiology recommendation, she will stay on loading dose of amiodarone 200 mg 2 times a day for 2 weeks and go back on her usual dose of amiodarone 200 mg daily. Reported metoprolol allergy, however she was started on low-dose bisoprolol and was able to tolerate well. Today her heart rate is acceptable.  Occasional heart rate more than 100 on mobility in therapies however asymptomatic. Patient is already on Xarelto that was continued.   Acute systolic congestive heart failure: Probably aggravated by stress and tachycardia. Chest pain and hypoxia improved.  Mildly elevated troponins secondary to demand ischemia. Patient is euvolemic and on room air now. Changed to oral Lasix.  Tolerating low-dose losartan 25 mg. Started on bisoprolol. Discussed with cardiology, she is too frail and debilitated to  undergo ischemia evaluation. Will refer to her cardiology at [facility] for follow-up after discharge. Currently optimized volume status. Will discharge on Lasix 40 mg daily, bisoprolol, losartan 25 mg daily.   Hypertension: Blood pressures fairly stable.  Discontinue verapamil as starting losartan for congestive heart failure.  Blood pressures optimally controlled.     Colon adenocarcinoma: Status post surgical resection.  Plan as per surgery.  Stable as per surgery.  Now tolerating regular diet. Pain is controlled. Lymph node biopsy is pending, surgery will follow up.  Patient is medically stable to transfer to a skilled level of care today.  Addendum, 3/15 p.m.: Patient's family did not agree with any other skilled nursing facility.  The facility of their choice does not have any bed available.  Patient is medically stable.  She has improved mobility.  Patient's son decided to take her home and take care of her at home.  Patient is fairly stable and mobility has much improved and safe to go home with 24/7 support at home by her son.  Discharge Diagnoses:  Principal Problem:   Cancer of ascending colon s/p lap colectomy 08/04/2020 Active Problems:   HTN (hypertension)   Permanent atrial fibrillation (HCC)   Iron deficiency anemia due to chronic blood loss   Chronic anticoagulation   Iron deficiency anemia   Constipation, chronic   HOH (hard of hearing)   Pressure injury of skin   Confusion, postoperative   Acute pulmonary edema (HCC)   GERD (gastroesophageal reflux disease)   Mitral regurgitation    Discharge Instructions  Discharge Instructions     Diet - low sodium heart healthy   Complete by: As directed    Increase activity slowly   Complete by: As directed  No wound care   Complete by: As directed       Allergies as of 08/13/2020       Reactions   Metoprolol Swelling   Feet, ankles and legs    Bactrim [sulfamethoxazole-trimethoprim] Other (See Comments)    Hearing loss   Ciprofloxacin Other (See Comments)   Flu-like symptoms   Pneumococcal Vaccines Other (See Comments)   Flu-like symptoms        Medication List     STOP taking these medications    verapamil 180 MG CR tablet Commonly known as: CALAN-SR       TAKE these medications    amiodarone 200 MG tablet Commonly known as: PACERONE Take 1 tablet (200 mg total) by mouth 2 (two) times daily for 14 days. What changed: when to take this   amiodarone 200 MG tablet Commonly known as: PACERONE Take 1 tablet (200 mg total) by mouth daily. Start taking on: August 28, 2020 What changed: You were already taking a medication with the same name, and this prescription was added. Make sure you understand how and when to take each.   bisoprolol 5 MG tablet Commonly known as: ZEBETA Take 1 tablet (5 mg total) by mouth daily.   calcium carbonate 500 MG chewable tablet Commonly known as: TUMS - dosed in mg elemental calcium Chew 500 mg by mouth daily as needed for indigestion or heartburn.   fluticasone 50 MCG/ACT nasal spray Commonly known as: FLONASE Place 1 spray into both nostrils daily.   Hair/Skin/Nails Caps Take 3 capsules by mouth daily.   losartan 25 MG tablet Commonly known as: COZAAR Take 1 tablet (25 mg total) by mouth daily.   lovastatin 40 MG tablet Commonly known as: MEVACOR Take 40 mg by mouth every evening.   melatonin 3 MG Tabs tablet Take 1 tablet (3 mg total) by mouth at bedtime.   multivitamin with minerals Tabs tablet Take 1 tablet by mouth daily. What changed: Another medication with the same name was added. Make sure you understand how and when to take each.   multivitamin with minerals Tabs tablet Take 1 tablet by mouth daily. What changed: You were already taking a medication with the same name, and this prescription was added. Make sure you understand how and when to take each.   pantoprazole 40 MG tablet Commonly known as: PROTONIX Take 1  tablet (40 mg total) by mouth 2 (two) times daily before a meal. What changed:  when to take this reasons to take this   polyethylene glycol powder 17 GM/SCOOP powder Commonly known as: GLYCOLAX/MIRALAX Take 17 g by mouth 2 (two) times daily as needed for moderate constipation.   potassium chloride 20 MEQ packet Commonly known as: KLOR-CON Take 20 mEq by mouth daily.   Rivaroxaban 15 MG Tabs tablet Commonly known as: XARELTO Take 1 tablet (15 mg total) by mouth daily with supper.   SYSTANE OP Place 1 drop into both eyes in the morning and at bedtime.   tretinoin 0.05 % cream Commonly known as: RETIN-A Apply 1 application topically at bedtime.       ASK your doctor about these medications    acetaminophen 500 MG tablet Commonly known as: TYLENOL Take 1 tablet (500 mg total) by mouth every 6 (six) hours as needed.   furosemide 40 MG tablet Commonly known as: LASIX Take 1 tablet (40 mg total) by mouth daily.   iron polysaccharides 150 MG capsule Commonly known as: NIFEREX Take 1 capsule (150  mg total) by mouth daily.   traMADol 50 MG tablet Commonly known as: Ultram Take 1 tablet (50 mg total) by mouth every 6 (six) hours as needed for up to 5 days. Ask about: Should I take this medication?        Follow-up Information     [name], MD Follow up in 2 week(s).   Specialties: General Surgery, Colon and Rectal Surgery Why: 2-3 weeks for postoperative appointment Contact information: [place] [address] [phone]                Allergies  Allergen Reactions   Metoprolol Swelling    Feet, ankles and legs    Bactrim [Sulfamethoxazole-Trimethoprim] Other (See Comments)    Hearing loss   Ciprofloxacin Other (See Comments)    Flu-like symptoms   Pneumococcal Vaccines Other (See Comments)    Flu-like symptoms    Consultations: General surgery, elective admission Cardiology   Procedures/Studies: DG CHEST PORT 1  VIEW  Result Date: 08/07/2020 CLINICAL DATA:  Shortness of breath EXAM: PORTABLE CHEST 1 VIEW COMPARISON:  03/31/2020 FINDINGS: Cardiac shadow is within normal limits. Aortic calcifications are seen. Increased vascular congestion is noted with bilateral pleural effusions left greater than right and mild interstitial edema consistent with CHF. No bony abnormality is noted. IMPRESSION: Changes of CHF with bilateral effusions and pulmonary edema. Electronically Signed   By: Inez Catalina M.D.   On: 08/07/2020 00:22   ECHOCARDIOGRAM COMPLETE  Result Date: 08/08/2020    ECHOCARDIOGRAM REPORT   Patient Name:   [name] [name] Date of Exam: 08/08/2020 Medical Rec #:  [id number]           Height:       63.0 in Accession #:    [id number]          Weight:       113.8 lb Date of Birth:  1929/06/30           BSA:          1.521 m Patient Age:    63 years            BP:           159/130 mmHg Patient Gender: F                   HR:           119 bpm. Exam Location:  Inpatient Procedure: 2D Echo, Color Doppler and Cardiac Doppler Indications:    CHF-Acute Diastolic X90.24  History:        Patient has prior history of Echocardiogram examinations, most                 recent 03/27/2020. Risk Factors:Hypertension.  Sonographer:    Bernadene Person RDCS Referring Phys: [id number] [place]  1. Patient is in Afib with RVR during study.  2. Left ventricular ejection fraction, by estimation, is 30 to 35%. The left ventricle has moderately decreased function. There is global hypokinesis that appears slightly worse in the basal-to-apical inferoseptal and basal-to-mid anteroseptal segments.  There is mild asymmetric left ventricular hypertrophy of the basal-septal segment. Diastolic function is indeterminant due to atrial fibrillation.  3. Right ventricular systolic function is moderately reduced. The right ventricular size is normal. There is normal pulmonary artery systolic pressure. The estimated right ventricular  systolic pressure is 99.2 mmHg.  4. Left atrial size was severely dilated.  5. Right atrial size was mildly dilated.  6. Moderate pleural effusion in the left lateral region.  7. The mitral valve is myxomatous. Moderate mitral valve regurgitation.  8. The aortic valve is tricuspid. There is mild calcification of the aortic valve. There is mild thickening of the aortic valve. Aortic valve regurgitation is trivial. Mild aortic valve sclerosis is present, with no evidence of aortic valve stenosis.  9. The inferior vena cava is normal in size with greater than 50% respiratory variability, suggesting right atrial pressure of 3 mmHg. Comparison(s): Compared to prior TTE in 03/27/2020, the LVEF has dropped to 30-35% (previously 60-65%) in the setting of Afib with RVR. The mitral valve thickening is better appreciated on prior study. There continues to be moderate MR and a pleural effusion. FINDINGS  Left Ventricle: Left ventricular ejection fraction, by estimation, is 30 to 35%. The left ventricle has moderately decreased function. There is global hypokinesis that appears slightly worse in the basal-to-apical inferoseptal and basal-to-mid anteroseptal segments. The left ventricular internal cavity size was normal in size. There is mild asymmetric left ventricular hypertrophy of the basal-septal segment. Diastolic function is indeterminant due to atrial fibrillation. Right Ventricle: The right ventricular size is normal. Right vetricular wall thickness was not well visualized. Right ventricular systolic function is moderately reduced. There is normal pulmonary artery systolic pressure. The tricuspid regurgitant velocity is 2.35 m/s, and with an assumed right atrial pressure of 3 mmHg, the estimated right ventricular systolic pressure is 29.5 mmHg. Left Atrium: Left atrial size was severely dilated. Right Atrium: Right atrial size was mildly dilated. Pericardium: There is no evidence of pericardial effusion. Mitral Valve:  The mitral valve is myxomatous. Mild to moderate mitral annular calcification. Moderate mitral valve regurgitation. Tricuspid Valve: The tricuspid valve is normal in structure. Tricuspid valve regurgitation is trivial. Aortic Valve: The aortic valve is tricuspid. There is mild calcification of the aortic valve. There is mild thickening of the aortic valve. Aortic valve regurgitation is trivial. Aortic regurgitation PHT measures 639 msec. Mild aortic valve sclerosis is present, with no evidence of aortic valve stenosis. Pulmonic Valve: The pulmonic valve was normal in structure. Pulmonic valve regurgitation is trivial. Aorta: The aortic root and ascending aorta are structurally normal, with no evidence of dilitation. Venous: The inferior vena cava is normal in size with greater than 50% respiratory variability, suggesting right atrial pressure of 3 mmHg. IAS/Shunts: No atrial level shunt detected by color flow Doppler. Additional Comments: There is a moderate pleural effusion in the left lateral region.  LEFT VENTRICLE PLAX 2D LVIDd:         4.20 cm LVIDs:         3.20 cm LV PW:         1.00 cm LV IVS:        0.80 cm LVOT diam:     1.90 cm LV SV:         27 LV SV Index:   18 LVOT Area:     2.84 cm  LV Volumes (MOD) LV vol d, MOD A2C: 81.0 ml LV vol d, MOD A4C: 66.6 ml LV vol s, MOD A2C: 44.0 ml LV vol s, MOD A4C: 34.2 ml LV SV MOD A2C:     37.0 ml LV SV MOD A4C:     66.6 ml LV SV MOD BP:      35.1 ml RIGHT VENTRICLE TAPSE (M-mode): 1.3 cm LEFT ATRIUM           Index       RIGHT ATRIUM  Index LA diam:      4.20 cm 2.76 cm/m  RA Area:     12.70 cm LA Vol (A2C): 50.6 ml 33.26 ml/m RA Volume:   32.40 ml  21.30 ml/m LA Vol (A4C): 53.4 ml 35.10 ml/m  AORTIC VALVE LVOT Vmax:   70.53 cm/s LVOT Vmean:  50.300 cm/s LVOT VTI:    0.096 m AI PHT:      639 msec  AORTA Ao Root diam: 2.90 cm Ao Asc diam:  3.00 cm MR Peak grad:    102.4 mmHg  TRICUSPID VALVE MR Mean grad:    66.0 mmHg   TR Peak grad:   22.1 mmHg MR  Vmax:         506.00 cm/s TR Vmax:        235.00 cm/s MR Vmean:        384.0 cm/s MR PISA:         0.57 cm    SHUNTS MR PISA Eff ROA: 4 mm       Systemic VTI:  0.10 m MR PISA Radius:  0.30 cm     Systemic Diam: 1.90 cm Gwyndolyn Kaufman MD Electronically signed by Gwyndolyn Kaufman MD Signature Date/Time: 08/08/2020/11:52:53 AM    Final    (Echo, Carotid, EGD, Colonoscopy, ERCP)    Subjective: Patient seen and examined.  No overnight events.  Telemetry shows A. fib with heart rate 80-occasionally more than 100.  Mostly less than 100.  Patient denies any chest pain or shortness of breath. She just feels tired staying in the hospital.   Discharge Exam: Vitals:   08/13/20 0521 08/13/20 1021  BP: 127/84 109/69  Pulse: 96 (!) 105  Resp: 16 18  Temp: 98.7 F (37.1 C) (!) 97.3 F (36.3 C)  SpO2: 98% 98%   Vitals:   08/12/20 1355 08/12/20 2122 08/13/20 0521 08/13/20 1021  BP: (!) 127/98 92/76 127/84 109/69  Pulse: (!) 47 92 96 (!) 105  Resp:  18 16 18   Temp:  99 F (37.2 C) 98.7 F (37.1 C) (!) 97.3 F (36.3 C)  TempSrc:  Oral Oral Oral  SpO2: 98% 97% 98% 98%  Weight:   52.8 kg   Height:        General: Pt is alert, awake, not in acute distress Frail and debilitated.  Not in any distress.  On room air. Cardiovascular: Irregularly irregular. S1/S2 +, no rubs, no gallops Respiratory: CTA bilaterally, no wheezing, no rhonchi, no added sounds Abdominal: Soft, NT, ND, bowel sounds + Extremities: no edema, no cyanosis    The results of significant diagnostics from this hospitalization (including imaging, microbiology, ancillary and laboratory) are listed below for reference.     Microbiology: Recent Results (from the past 240 hour(s))  MRSA Next Gen by PCR, Nasal     Status: None   Collection Time: 08/07/20  3:31 AM   Specimen: Nasal Mucosa; Nasal Swab  Result Value Ref Range Status   MRSA by PCR Next Gen NOT DETECTED NOT DETECTED Final    Comment: (NOTE) The GeneXpert MRSA  Assay (FDA approved for NASAL specimens only), is one component of a comprehensive MRSA colonization surveillance program. It is not intended to diagnose MRSA infection nor to guide or monitor treatment for MRSA infections. Test performance is not FDA approved in patients less than 27 years old. Performed at [facility], [place] [address] [place], [place] [address]      Labs: BNP (last 3 results) Recent Labs  01/15/20 0238 03/25/20 1133 08/07/20 0457  BNP 654.7* 598.4* 1,779.3*   Basic Metabolic Panel: Recent Labs  Lab 08/07/20 0457 08/08/20 0838 08/09/20 0245 08/12/20 0505  NA 141 141 138 137  K 4.2 3.6 3.7 4.7  CL 106 104 103 104  CO2 26 26 25 23   GLUCOSE 127* 113* 117* 109*  BUN 19 20 30* 28*  CREATININE 0.69 0.66 0.77 0.88  CALCIUM 9.0 9.2 9.1 8.9  MG 2.0  --   --  2.0  PHOS 2.7  --   --  4.0   Liver Function Tests: No results for input(s): AST, ALT, ALKPHOS, BILITOT, PROT, ALBUMIN in the last 168 hours. No results for input(s): LIPASE, AMYLASE in the last 168 hours. No results for input(s): AMMONIA in the last 168 hours. CBC: Recent Labs  Lab 08/07/20 0457 08/09/20 0245 08/12/20 0505  WBC 12.2* 11.7* 12.4*  NEUTROABS  --  7.3 8.0*  HGB 10.8* 10.4* 10.5*  HCT 35.8* 34.4* 35.3*  MCV 94.2 93.7 94.9  PLT 395 377 355   Cardiac Enzymes: No results for input(s): CKTOTAL, CKMB, CKMBINDEX, TROPONINI in the last 168 hours. BNP: Invalid input(s): POCBNP CBG: Recent Labs  Lab 08/06/20 2350  GLUCAP 135*   D-Dimer No results for input(s): DDIMER in the last 72 hours. Hgb A1c No results for input(s): HGBA1C in the last 72 hours. Lipid Profile No results for input(s): CHOL, HDL, LDLCALC, TRIG, CHOLHDL, LDLDIRECT in the last 72 hours. Thyroid function studies No results for input(s): TSH, T4TOTAL, T3FREE, THYROIDAB in the last 72 hours.  Invalid input(s): FREET3 Anemia work up No results for input(s): VITAMINB12, FOLATE, FERRITIN,  TIBC, IRON, RETICCTPCT in the last 72 hours. Urinalysis    Component Value Date/Time   COLORURINE YELLOW (A) 03/25/2020 1133   APPEARANCEUR CLOUDY (A) 03/25/2020 1133   APPEARANCEUR Cloudy 05/31/2012 2019   LABSPEC 1.018 03/25/2020 1133   LABSPEC 1.011 05/31/2012 2019   PHURINE 9.0 (H) 03/25/2020 1133   GLUCOSEU NEGATIVE 03/25/2020 1133   GLUCOSEU 50 mg/dL 05/31/2012 2019   HGBUR NEGATIVE 03/25/2020 1133   [place] 03/25/2020 1133   BILIRUBINUR Negative 05/31/2012 2019   KETONESUR NEGATIVE 03/25/2020 1133   PROTEINUR NEGATIVE 03/25/2020 1133   NITRITE NEGATIVE 03/25/2020 1133   LEUKOCYTESUR NEGATIVE 03/25/2020 1133   LEUKOCYTESUR Negative 05/31/2012 2019   Sepsis Labs Invalid input(s): PROCALCITONIN,  WBC,  LACTICIDVEN Microbiology Recent Results (from the past 240 hour(s))  MRSA Next Gen by PCR, Nasal     Status: None   Collection Time: 08/07/20  3:31 AM   Specimen: Nasal Mucosa; Nasal Swab  Result Value Ref Range Status   MRSA by PCR Next Gen NOT DETECTED NOT DETECTED Final    Comment: (NOTE) The GeneXpert MRSA Assay (FDA approved for NASAL specimens only), is one component of a comprehensive MRSA colonization surveillance program. It is not intended to diagnose MRSA infection nor to guide or monitor treatment for MRSA infections. Test performance is not FDA approved in patients less than 29 years old. Performed at [facility], [place] [address] [place], [place] [address]      Time coordinating discharge: 45 minutes  SIGNED:   Barb Merino, MD  [facility] 08/13/2020, 3:16 PM

## 2020-08-13 NOTE — Progress Notes (Signed)
Progress Note  Patient Name: [name] [name] Date of Encounter: 08/13/2020  [facility] Cardiologist:   Subjective   No new complaints.  Abdomen is feeling well.  Rarely has chest discomfort.  Breathing is comfortable.  No bleeding.  Inpatient Medications    Scheduled Meds:  amiodarone  400 mg Oral BID   bisoprolol  5 mg Oral Daily   Chlorhexidine Gluconate Cloth  6 each Topical Q0600   feeding supplement  237 mL Oral BID BM   fluticasone  1 spray Each Nare Daily   furosemide  40 mg Oral BID   iron polysaccharides  150 mg Oral BID   lip balm  1 application Topical BID   losartan  25 mg Oral Daily   melatonin  3 mg Oral QHS   multivitamin with minerals  1 tablet Oral Daily   pantoprazole  40 mg Oral BID AC   polycarbophil  625 mg Oral BID   potassium chloride  20 mEq Oral Daily   rivaroxaban  15 mg Oral Q supper   Continuous Infusions:  PRN Meds: alum & mag hydroxide-simeth, calcium carbonate, diphenhydrAMINE **OR** [DISCONTINUED] diphenhydrAMINE, magic mouthwash, menthol-cetylpyridinium, ondansetron **OR** ondansetron (ZOFRAN) IV, phenol, simethicone, traMADol, traZODone   Vital Signs    Vitals:   08/12/20 1353 08/12/20 1355 08/12/20 2122 08/13/20 0521  BP: (!) 168/152 (!) 127/98 92/76 127/84  Pulse: 71 (!) 47 92 96  Resp: 20  18 16   Temp: 98.2 F (36.8 C)  99 F (37.2 C) 98.7 F (37.1 C)  TempSrc: Oral  Oral Oral  SpO2: (!) 87% 98% 97% 98%  Weight:    52.8 kg  Height:        Intake/Output Summary (Last 24 hours) at 08/13/2020 0622 Last data filed at 08/13/2020 0536 Gross per 24 hour  Intake 480 ml  Output 1400 ml  Net -920 ml   Last 3 Weights 08/13/2020 08/11/2020 08/10/2020  Weight (lbs) 116 lb 6.5 oz 124 lb 1.9 oz 120 lb 5.9 oz  Weight (kg) 52.8 kg 56.3 kg 54.6 kg      Telemetry    Recent atrial fibrillation with heart rate 95, previously as high as 120 yesterday afternoon.- Personally Reviewed  ECG    08/08/2020 atrial fibrillation heart  rate 140 bpm- Personally Reviewed  Physical Exam   GEN: No acute distress.  [place]: No JVD Cardiac: Irregularly irregular, no murmurs, rubs, or gallops.  Respiratory: Clear to auscultation bilaterally. GI: Soft, nontender, non-distended postop changes MS: No edema; No deformity. Neuro:  Nonfocal  Psych: Normal affect   Labs    High Sensitivity Troponin:   Recent Labs  Lab 08/07/20 1642 08/07/20 1900  TROPONINIHS 149* 140*      Chemistry Recent Labs  Lab 08/08/20 0838 08/09/20 0245 08/12/20 0505  NA 141 138 137  K 3.6 3.7 4.7  CL 104 103 104  CO2 26 25 23   GLUCOSE 113* 117* 109*  BUN 20 30* 28*  CREATININE 0.66 0.77 0.88  CALCIUM 9.2 9.1 8.9  GFRNONAA >60 >60 >60  ANIONGAP 11 10 10      Hematology Recent Labs  Lab 08/07/20 0457 08/09/20 0245 08/12/20 0505  WBC 12.2* 11.7* 12.4*  RBC 3.80* 3.67* 3.72*  HGB 10.8* 10.4* 10.5*  HCT 35.8* 34.4* 35.3*  MCV 94.2 93.7 94.9  MCH 28.4 28.3 28.2  MCHC 30.2 30.2 29.7*  RDW 17.5* 17.6* 17.4*  PLT 395 377 355    BNP Recent Labs  Lab 08/07/20 0457  BNP 2,411.8*     DDimer No results for input(s): DDIMER in the last 168 hours.   Radiology    No results found.  Cardiac Studies   ECHO this admit:   1. Patient is in Afib with RVR during study.   2. Left ventricular ejection fraction, by estimation, is 30 to 35%. The  left ventricle has moderately decreased function. There is global  hypokinesis that appears slightly worse in the basal-to-apical  inferoseptal and basal-to-mid anteroseptal segments.   There is mild asymmetric left ventricular hypertrophy of the basal-septal  segment. Diastolic function is indeterminant due to atrial fibrillation.   3. Right ventricular systolic function is moderately reduced. The right  ventricular size is normal. There is normal pulmonary artery systolic  pressure. The estimated right ventricular systolic pressure is 74.1 mmHg.   4. Left atrial size was severely  dilated.   5. Right atrial size was mildly dilated.   6. Moderate pleural effusion in the left lateral region.   7. The mitral valve is myxomatous. Moderate mitral valve regurgitation.   8. The aortic valve is tricuspid. There is mild calcification of the  aortic valve. There is mild thickening of the aortic valve. Aortic valve  regurgitation is trivial. Mild aortic valve sclerosis is present, with no  evidence of aortic valve stenosis.   9. The inferior vena cava is normal in size with greater than 50%  respiratory variability, suggesting right atrial pressure of 3 mmHg.   Comparison(s): Compared to prior TTE in 03/27/2020, the LVEF has dropped  to 30-35% (previously 60-65%) in the setting of Afib with RVR. The mitral  valve thickening is better appreciated on prior study. There continues to  be moderate MR and a pleural  effusion.   Patient Profile     87 y.o. female recently diagnosed with colon cancer status post colectomy on 08/04/2020.  Has a history of paroxysmal atrial fibrillation on amiodarone and Xarelto.  Had RVR postoperatively.  Low-dose bisoprolol was added to her regimen.  Assessment & Plan    Paroxysmal atrial fibrillation - Exacerbated postoperatively post colectomy, colon cancer. - Low-dose bisoprolol, beta-blocker, was added.  She had trouble in the past with metoprolol causing swelling. - Continue with amiodarone as well 400 twice daily.  On discharge decreased to 200 mg twice a day for 2 weeks and then 200 mg a day thereafter. - Continue with anticoagulation.  Chronic anticoagulation - Continue with Xarelto.  Watch for any signs of postoperative bleeding.  CHA2DS2-VASc score is 6.  Acute on chronic systolic heart failure - EF on echocardiogram 30 to 35% this admission.  Previously had normal EF in February 2022.  Reduction in EF may be secondary to A. fib with RVR. - Currently on angiotensin receptor blocker, losartan, low-dose bisoprolol. - On Lasix 40 mg twice  daily.  Looks comfortable breathing comfortably.  Consider decreasing Lasix to 40 mg once a day on discharge. - Continue with appropriate medical therapy.  Try to avoid invasive procedures.  Mitral regurgitation - Moderate.  This was seen on prior ultrasound study in February.  Mitral valve degeneration/thickening.  Elevated troponin - Low and flat not compatible with acute coronary syndrome.  This is demand ischemia in the setting of colon cancer recent surgery.  This does increase her overall morbidity mortality however.  Will follow up with [facility] clinic after discharge.      For questions or updates, please contact [place] Please consult [email or web address] for contact info under  Signed, Candee Furbish, MD  08/13/2020, 6:22 AM

## 2020-08-13 NOTE — Progress Notes (Signed)
I tried calling patient's son but could not get through him.  Voicemail left.  He has not communicated with nursing staff or the unit about picking up his mom as he had expressed with the Education officer, museum.  I did talk to the patient and explained to her the situation, she is not very optimistic about him able to help her for ADLs. I am discontinuing the discharge orders.  We will try to touch base with patient's son.  If he is not available or not able to take care of the patient, she is not safe to go home. To the social worker , he is not ready to listen to any alternate SNFs and we are not sure [facility] will have any beds even by Monday.

## 2020-08-13 NOTE — Progress Notes (Signed)
9 Days Post-Op   Subjective/Chief Complaint: LOOKS WELL   NO COMPLAINTS    Objective: Vital signs in last 24 hours: Temp:  [98.2 F (36.8 C)-99 F (37.2 C)] 98.7 F (37.1 C) (07/16 0521) Pulse Rate:  [47-96] 96 (07/16 0521) Resp:  [16-20] 16 (07/16 0521) BP: (92-168)/(76-152) 127/84 (07/16 0521) SpO2:  [87 %-98 %] 98 % (07/16 0521) Weight:  [52.8 kg] 52.8 kg (07/16 0521) Last BM Date: 08/11/20  Intake/Output from previous day: 07/15 0701 - 07/16 0700 In: 480 [P.O.:480] Out: 1400 [Urine:1400] Intake/Output this shift: No intake/output data recorded.   Gen: NAD, comfortable, sleepy but arouses to voice CV: irreg rate/rhythm Pulm: Normal work of breathing Abd: Soft, NT/ND. Incisions c/d/i without erythema or drainage. No palpable hernias Ext: SCDs in place Lab Results:  Recent Labs    08/12/20 0505  WBC 12.4*  HGB 10.5*  HCT 35.3*  PLT 355   BMET Recent Labs    08/12/20 0505  NA 137  K 4.7  CL 104  CO2 23  GLUCOSE 109*  BUN 28*  CREATININE 0.88  CALCIUM 8.9   PT/INR No results for input(s): LABPROT, INR in the last 72 hours. ABG No results for input(s): PHART, HCO3 in the last 72 hours.  Invalid input(s): PCO2, PO2  Studies/Results: No results found.  Anti-infectives: Anti-infectives (From admission, onward)    Start     Dose/Rate Route Frequency Ordered Stop   08/04/20 1400  neomycin (MYCIFRADIN) tablet 1,000 mg  Status:  Discontinued       See Hyperspace for full Linked Orders Report.   1,000 mg Oral 3 times per day 08/04/20 1043 08/04/20 1048   08/04/20 1400  metroNIDAZOLE (FLAGYL) tablet 1,000 mg  Status:  Discontinued       See Hyperspace for full Linked Orders Report.   1,000 mg Oral 3 times per day 08/04/20 1043 08/04/20 1048   08/04/20 0600  cefoTEtan (CEFOTAN) 2 g in sodium chloride 0.9 % 100 mL IVPB        2 g 200 mL/hr over 30 Minutes Intravenous On call to O.R. 08/03/20 0743 08/04/20 1256       Assessment/Plan: s/p  Procedure(s): LAPAROSCOPIC RIGHT HEMI COLECTOMY (Right) Patient Active Problem List   Diagnosis Date Noted   Pressure injury of skin 08/07/2020   Confusion, postoperative 08/07/2020   Acute pulmonary edema ([place]) 08/07/2020   Diverticular disease 08/07/2020   GERD (gastroesophageal reflux disease) 08/07/2020   History of Bell's palsy 08/07/2020   Mitral regurgitation 08/07/2020   Cancer of ascending colon s/p lap colectomy 08/04/2020 06/01/2020   Colonic mass    Closed intertrochanteric fracture ([employer]) 04/12/2020   Colles' fracture 04/12/2020   Osteoarthritis of knee 04/12/2020   Symptomatic anemia 03/25/2020   HLD (hyperlipidemia) 03/25/2020   Bilateral leg edema 02/04/2020   Rapid atrial fibrillation ([place]) 01/11/2020   Chronic anticoagulation 01/11/2020   Chronic ulcer of left leg ([place]) 01/11/2020   Iron deficiency anemia 01/11/2020   Hypokalemia 01/01/2020   Left leg cellulitis 01/01/2020   Iron deficiency anemia due to chronic blood loss 01/01/2020   Wound cellulitis 12/31/2019   Aortic atherosclerosis ([place]) 09/25/2019   HOH (hard of hearing) 08/31/2019   History of pelvic fracture 08/31/2019   Pelvic fracture ([place]) 08/25/2019   Permanent atrial fibrillation ([place]) 08/25/2019   Pain    Dehydration    Accidental fall 07/26/2019   UTI (urinary tract infection) 07/26/2019   Closed fracture of right inferior pubic ramus ([place]) 07/26/2019  HTN (hypertension) 07/26/2019   Ambulatory dysfunction 07/26/2019   Pubic bone fracture ([place]) 07/26/2019   History of colon polyps 06/25/2017   Constipation, chronic 07/27/2013   Osteopenia 07/27/2013        Clinically she continues to do reasonably well -Continue soft diet as tolerated -Working with therapies -Ppx: PO anticoagulation , SCDs; on BID PPI currently -Dispo: Planning SNF as per PT/OT once cleared by medicine for discharge  LOS: 9 days    Turner Daniels MD  08/13/2020

## 2020-08-13 NOTE — TOC Transition Note (Signed)
Transition of Care [facility]) - CM/SW Discharge Note  Patient Details  Name: [name] [name] MRN: [id number] Date of Birth: 01/20/1930  [facility] CM/SW Contact:  Sherie Don, LCSW Phone Number: 08/13/2020, 1:22 PM  Clinical Narrative: CSW called [facility] and was notified there are no beds available at this time. CSW called patient's son to review other bed offers. Son did not want to send the patient to any of the available beds and reported he will pick up the patient today and take her home. Son will continue to try to get the patient into [facility] from home. CSW updated hospitalist and RN. TOC signing off.  Final next level of care: Home/Self Care Barriers to Discharge: Barriers Resolved  Patient Goals and CMS Choice Patient states their goals for this hospitalization and ongoing recovery are:: prefers to return home CMS Medicare.gov Compare Post Acute Care list provided to:: Patient Represenative (must comment) Choice offered to / list presented to : Patient, Adult Children  Discharge Plan and Services In-house Referral: Clinical Social Work        DME Arranged: N/A DME Agency: NA  Readmission Risk Interventions No flowsheet data found.

## 2020-08-14 DIAGNOSIS — I340 Nonrheumatic mitral (valve) insufficiency: Secondary | ICD-10-CM

## 2020-08-14 DIAGNOSIS — Z7901 Long term (current) use of anticoagulants: Secondary | ICD-10-CM

## 2020-08-14 DIAGNOSIS — I4821 Permanent atrial fibrillation: Secondary | ICD-10-CM

## 2020-08-14 DIAGNOSIS — I5023 Acute on chronic systolic (congestive) heart failure: Secondary | ICD-10-CM

## 2020-08-14 MED ORDER — FUROSEMIDE 40 MG PO TABS: 40.0000 mg | ORAL_TABLET | Freq: Every day | ORAL | 1 refills | Status: DC

## 2020-08-14 MED ORDER — POLYSACCHARIDE IRON COMPLEX 150 MG PO CAPS
150.0000 mg | ORAL_CAPSULE | Freq: Every day | ORAL | 0 refills | Status: DC
Start: 2020-08-14 — End: 2021-05-09

## 2020-08-14 NOTE — Progress Notes (Signed)
  See prior note Awaiting DC  Signed off.   Candee Furbish, MD

## 2020-08-14 NOTE — Progress Notes (Signed)
Physical Therapy Treatment Patient Details Name: [name] [name] MRN: [id number] DOB: March 23, 1929 Today's Date: 08/14/2020    History of Present Illness Patient is an 87 year old female s/p R hemi colectomy due to colon CA. PMH includes HTN, HLD, afib, hearing loss    PT Comments    Pt very motivated and eager to mobilize, "this is what I needed to do".  Pt up to bathroom for toileting and to wash hands and brush teeth at sink.  Pt ambulated in halls with RW and steady assist x 145' with max HR noted 114.  RN aware.   Follow Up Recommendations  SNF     Equipment Recommendations  None recommended by PT    Recommendations for Other Services       Precautions / Restrictions Precautions Precautions: Fall Precaution Comments: abd sx, deaf left ear, hearing aide right ear Restrictions Weight Bearing Restrictions: No    Mobility  Bed Mobility               General bed mobility comments: Pt up in chair and requests back to same    Transfers Overall transfer level: Needs assistance Equipment used: Rolling walker (2 wheeled) Transfers: Sit to/from Stand Sit to Stand: Min guard         General transfer comment: steady assist with cues for use of UEs  Ambulation/Gait Ambulation/Gait assistance: Min assist;Min guard Gait Distance (Feet): 145 Feet (and 15' into bathroom) Assistive device: Rolling walker (2 wheeled) Gait Pattern/deviations: Step-through pattern;Decreased stride length;Narrow base of support Gait velocity: decr   General Gait Details: Pt offered rollator but preferred RW.  Steady assist with cues for posture, position from RW and to increase BOS   Stairs             Wheelchair Mobility    Modified Rankin (Stroke Patients Only)       Balance Overall balance assessment: Needs assistance Sitting-balance support: No upper extremity supported Sitting balance-Leahy Scale: Good     Standing balance support: No upper extremity  supported Standing balance-Leahy Scale: Fair                              Cognition Arousal/Alertness: Awake/alert Behavior During Therapy: WFL for tasks assessed/performed Overall Cognitive Status: Within Functional Limits for tasks assessed                                 General Comments: WFL for questions asked, pt very HOH, talked into right ear      Exercises      General Comments        Pertinent Vitals/Pain Pain Assessment: No/denies pain    Home Living                      Prior Function            PT Goals (current goals can now be found in the care plan section) Acute Rehab PT Goals Patient Stated Goal: to get strength back PT Goal Formulation: With patient Time For Goal Achievement: 08/19/20 Potential to Achieve Goals: Good Progress towards PT goals: Progressing toward goals    Frequency    Min 2X/week      PT Plan Current plan remains appropriate    Co-evaluation              AM-PAC PT "6 Clicks"  Mobility   Outcome Measure  Help needed turning from your back to your side while in a flat bed without using bedrails?: A Little Help needed moving from lying on your back to sitting on the side of a flat bed without using bedrails?: A Little Help needed moving to and from a bed to a chair (including a wheelchair)?: A Little Help needed standing up from a chair using your arms (e.g., wheelchair or bedside chair)?: A Little Help needed to walk in hospital room?: A Little Help needed climbing 3-5 steps with a railing? : A Lot 6 Click Score: 17    End of Session Equipment Utilized During Treatment: Gait belt Activity Tolerance: Patient tolerated treatment well Patient left: in chair;with call bell/phone within reach;with chair alarm set Nurse Communication: Mobility status PT Visit Diagnosis: Other abnormalities of gait and mobility (R26.89)     Time: 1025-8527 PT Time Calculation (min) (ACUTE ONLY): 25  min  Charges:  $Gait Training: 8-22 mins $Therapeutic Activity: 8-22 mins                     [place] Pager [phone] Office [phone]    [name] [name] 08/14/2020, 12:22 PM

## 2020-08-14 NOTE — Progress Notes (Signed)
10 Days Post-Op   Subjective/Chief Complaint: Doing well no complaints   Objective: Vital signs in last 24 hours: Temp:  [97.3 F (36.3 C)-98.4 F (36.9 C)] 98.1 F (36.7 C) (07/17 0548) Pulse Rate:  [88-105] 88 (07/17 0548) Resp:  [18] 18 (07/16 2043) BP: (97-113)/(62-69) 113/62 (07/17 0548) SpO2:  [95 %-98 %] 97 % (07/17 0548) Last BM Date: 08/11/20  Intake/Output from previous day: 07/16 0701 - 07/17 0700 In: -  Out: 400 [Urine:400] Intake/Output this shift: No intake/output data recorded.  General appearance: alert and cooperative Incision/Wound: CDI soft NT   Lab Results:  Recent Labs    08/12/20 0505  WBC 12.4*  HGB 10.5*  HCT 35.3*  PLT 355   BMET Recent Labs    08/12/20 0505  NA 137  K 4.7  CL 104  CO2 23  GLUCOSE 109*  BUN 28*  CREATININE 0.88  CALCIUM 8.9   PT/INR No results for input(s): LABPROT, INR in the last 72 hours. ABG No results for input(s): PHART, HCO3 in the last 72 hours.  Invalid input(s): PCO2, PO2  Studies/Results: No results found.  Anti-infectives: Anti-infectives (From admission, onward)    Start     Dose/Rate Route Frequency Ordered Stop   08/04/20 1400  neomycin (MYCIFRADIN) tablet 1,000 mg  Status:  Discontinued       See Hyperspace for full Linked Orders Report.   1,000 mg Oral 3 times per day 08/04/20 1043 08/04/20 1048   08/04/20 1400  metroNIDAZOLE (FLAGYL) tablet 1,000 mg  Status:  Discontinued       See Hyperspace for full Linked Orders Report.   1,000 mg Oral 3 times per day 08/04/20 1043 08/04/20 1048   08/04/20 0600  cefoTEtan (CEFOTAN) 2 g in sodium chloride 0.9 % 100 mL IVPB        2 g 200 mL/hr over 30 Minutes Intravenous On call to O.R. 08/03/20 0743 08/04/20 1256       Assessment/Plan: s/p Procedure(s): LAPAROSCOPIC RIGHT HEMI COLECTOMY (Right) Clear for discharge vs placement   LOS: 10 days    Marcello Moores A Amerigo Mcglory 08/14/2020

## 2020-08-14 NOTE — Progress Notes (Signed)
PROGRESS NOTE  [name] [name] OZD:[id number] DOB: 26-Jan-1930   PCP: [name], MD  Patient is from: Home  DOA: 08/04/2020 LOS: 10  Chief complaints:  No chief complaint on file.    Brief Narrative / Interim history: 87 year old F with PMH of HTN, HLD, A. fib on Xarelto, hearing loss, IDA and recent diagnosis of ileocecal adenocarcinoma who was admitted by general surgery and underwent laparoscopic right hemicolectomy 7/7.  Postoperatively, she became hypoxic and went into RVR and acute diastolic CHF.  Hospitalist service and cardiology consulted for further care.  Patient was diuresed with IV Lasix and loaded with amiodarone.  TTE with LVEF of 30 to 35% (from 60 to 65% previously), global hypokinesis, severe LAE, mod MVR, and mod pleural effusion,  Started on GDMT.  Eventually, improved and cleared for discharge by cardiology and general surgery.  Therapy recommended SNF.  See individual problem list below for more on hospital course.   Subjective: Seen and examined earlier this morning.  No major events overnight of this morning.  No complaints.  Objective: Vitals:   08/13/20 1021 08/13/20 2043 08/14/20 0548 08/14/20 1114  BP: 109/69 97/69 113/62 (!) 88/66  Pulse: (!) 105 97 88 84  Resp: 18 18  16   Temp: (!) 97.3 F (36.3 C) 98.4 F (36.9 C) 98.1 F (36.7 C) 97.7 F (36.5 C)  TempSrc: Oral Oral Oral Oral  SpO2: 98% 95% 97% 97%  Weight:      Height:        Intake/Output Summary (Last 24 hours) at 08/14/2020 1611 Last data filed at 08/13/2020 2255 Gross per 24 hour  Intake --  Output 400 ml  Net -400 ml   Filed Weights   08/10/20 0500 08/11/20 0500 08/13/20 0521  Weight: 54.6 kg 56.3 kg 52.8 kg    Examination:  GENERAL: No apparent distress.  Nontoxic. HEENT: MMM.  Significantly diminished hearing. NECK: Supple.  No apparent JVD.  RESP:  No IWOB.  Fair aeration bilaterally. CVS:  RRR. Heart sounds normal.  ABD/GI/GU: BS+. Abd soft, NTND.  MSK/EXT:   Moves extremities. No apparent deformity. No edema.  SKIN: no apparent skin lesion or wound NEURO: Awake, alert and oriented appropriately.  No apparent focal neuro deficit. PSYCH: Calm. Normal affect.   Procedures:  7/7-laparoscopic right hemicolectomy  Microbiology summarized: MRSA PCR screen negative.  Assessment & Plan: Paroxysmal A. fib with RVR: provoked in the setting of surgery and fluid overload.  -Cardiology gave recommendations and signed off -On amiodarone 400 mg twice daily in-house -Amiodarone 200 mg twice daily for 2 weeks followed by 200 mg daily -Continue bisoprolol -Continue Xarelto for anticoagulation  Acute on chronic systolic CHF-TTE with LVEF of 30 to 35% (from 60 to 65% previously), global hypokinesis, severe LAE, mod MVR, and mod pleural effusion.  Diuresis with IV Lasix and transition to p.o. on RA.  Appears euvolemic.  I&O incomplete.  Net -4 L charted.  No labs this morning -Continue p.o. Lasix 40 mg daily and occasional -GDMT-bisoprolol, losartan -Monitor fluid status and renal functions   Essential hypertension: Normotensive. -Cardiac meds as above   Metastatic adenocarcinoma of colon: s/p laparoscopic hemicolectomy.  Pathology positive in four of twenty-one lymph nodes.  -Outpatient follow-up with oncology -Recommend palliative follow-up at SNF  Diminished hearing-  Nutritional status Body mass index is 20.62 kg/m.     Pressure skin injury: Not POA Pressure Injury 08/07/20 Coccyx Medial Stage 2 -  Partial thickness loss of dermis presenting as a shallow open  injury with a red, pink wound bed without slough. Small pin sized open area on sacrum (Active)  08/07/20 0345  Location: Coccyx  Location Orientation: Medial  Staging: Stage 2 -  Partial thickness loss of dermis presenting as a shallow open injury with a red, pink wound bed without slough.  Wound Description (Comments): Small pin sized open area on sacrum  Present on Admission: No      Pressure Injury 08/10/20 Buttocks Right;Left;Medial Stage 1 -  Intact skin with non-blanchable redness of a localized area usually over a bony prominence. nonblancable redness (Active)  08/10/20 0900  Location: Buttocks (sacrum)  Location Orientation: Right;Left;Medial  Staging: Stage 1 -  Intact skin with non-blanchable redness of a localized area usually over a bony prominence.  Wound Description (Comments): nonblancable redness  Present on Admission:    DVT prophylaxis:  SCD's Start: 08/04/20 1600 Place TED hose Start: 08/04/20 1600 Rivaroxaban (XARELTO) tablet 15 mg  Code Status: Full code Family Communication: Patient and/or RN.  Updated patient's son over the phone Level of care: Telemetry Status is: Inpatient  Remains inpatient appropriate because:Unsafe d/c plan  Dispo:  Patient From: Home  Planned Disposition: [place]  Medically stable for discharge: Yes         Consultants:  General surgery Cardiology   Sch Meds:  Scheduled Meds:  amiodarone  400 mg Oral BID   bisoprolol  5 mg Oral Daily   Chlorhexidine Gluconate Cloth  6 each Topical Q0600   feeding supplement  237 mL Oral BID BM   fluticasone  1 spray Each Nare Daily   furosemide  40 mg Oral BID   iron polysaccharides  150 mg Oral BID   lip balm  1 application Topical BID   losartan  25 mg Oral Daily   melatonin  3 mg Oral QHS   multivitamin with minerals  1 tablet Oral Daily   pantoprazole  40 mg Oral BID AC   polycarbophil  625 mg Oral BID   potassium chloride  20 mEq Oral Daily   rivaroxaban  15 mg Oral Q supper   Continuous Infusions: PRN Meds:.alum & mag hydroxide-simeth, calcium carbonate, diphenhydrAMINE **OR** [DISCONTINUED] diphenhydrAMINE, magic mouthwash, menthol-cetylpyridinium, ondansetron **OR** ondansetron (ZOFRAN) IV, phenol, simethicone, traMADol, traZODone  Antimicrobials: Anti-infectives (From admission, onward)    Start     Dose/Rate Route Frequency Ordered  Stop   08/04/20 1400  neomycin (MYCIFRADIN) tablet 1,000 mg  Status:  Discontinued       See Hyperspace for full Linked Orders Report.   1,000 mg Oral 3 times per day 08/04/20 1043 08/04/20 1048   08/04/20 1400  metroNIDAZOLE (FLAGYL) tablet 1,000 mg  Status:  Discontinued       See Hyperspace for full Linked Orders Report.   1,000 mg Oral 3 times per day 08/04/20 1043 08/04/20 1048   08/04/20 0600  cefoTEtan (CEFOTAN) 2 g in sodium chloride 0.9 % 100 mL IVPB        2 g 200 mL/hr over 30 Minutes Intravenous On call to O.R. 08/03/20 0743 08/04/20 1256        I have personally reviewed the following labs and images: CBC: Recent Labs  Lab 08/09/20 0245 08/12/20 0505  WBC 11.7* 12.4*  NEUTROABS 7.3 8.0*  HGB 10.4* 10.5*  HCT 34.4* 35.3*  MCV 93.7 94.9  PLT 377 355   BMP &GFR Recent Labs  Lab 08/08/20 0838 08/09/20 0245 08/12/20 0505  NA 141 138 137  K 3.6 3.7  4.7  CL 104 103 104  CO2 26 25 23   GLUCOSE 113* 117* 109*  BUN 20 30* 28*  CREATININE 0.66 0.77 0.88  CALCIUM 9.2 9.1 8.9  MG  --   --  2.0  PHOS  --   --  4.0   Estimated Creatinine Clearance: 35.9 mL/min (by C-G formula based on SCr of 0.88 mg/dL). Liver & Pancreas: No results for input(s): AST, ALT, ALKPHOS, BILITOT, PROT, ALBUMIN in the last 168 hours. No results for input(s): LIPASE, AMYLASE in the last 168 hours. No results for input(s): AMMONIA in the last 168 hours. Diabetic: No results for input(s): HGBA1C in the last 72 hours. No results for input(s): GLUCAP in the last 168 hours. Cardiac Enzymes: No results for input(s): CKTOTAL, CKMB, CKMBINDEX, TROPONINI in the last 168 hours. No results for input(s): PROBNP in the last 8760 hours. Coagulation Profile: No results for input(s): INR, PROTIME in the last 168 hours. Thyroid Function Tests: No results for input(s): TSH, T4TOTAL, FREET4, T3FREE, THYROIDAB in the last 72 hours. Lipid Profile: No results for input(s): CHOL, HDL, LDLCALC, TRIG,  CHOLHDL, LDLDIRECT in the last 72 hours. Anemia Panel: No results for input(s): VITAMINB12, FOLATE, FERRITIN, TIBC, IRON, RETICCTPCT in the last 72 hours. Urine analysis:    Component Value Date/Time   COLORURINE YELLOW (A) 03/25/2020 1133   APPEARANCEUR CLOUDY (A) 03/25/2020 1133   APPEARANCEUR Cloudy 05/31/2012 2019   LABSPEC 1.018 03/25/2020 1133   LABSPEC 1.011 05/31/2012 2019   PHURINE 9.0 (H) 03/25/2020 1133   GLUCOSEU NEGATIVE 03/25/2020 1133   GLUCOSEU 50 mg/dL 05/31/2012 2019   HGBUR NEGATIVE 03/25/2020 [place] 03/25/2020 1133   BILIRUBINUR Negative 05/31/2012 2019   KETONESUR NEGATIVE 03/25/2020 1133   PROTEINUR NEGATIVE 03/25/2020 1133   NITRITE NEGATIVE 03/25/2020 1133   LEUKOCYTESUR NEGATIVE 03/25/2020 1133   LEUKOCYTESUR Negative 05/31/2012 2019   Sepsis Labs: Invalid input(s): PROCALCITONIN, [place]  Microbiology: Recent Results (from the past 240 hour(s))  MRSA Next Gen by PCR, Nasal     Status: None   Collection Time: 08/07/20  3:31 AM   Specimen: Nasal Mucosa; Nasal Swab  Result Value Ref Range Status   MRSA by PCR Next Gen NOT DETECTED NOT DETECTED Final    Comment: (NOTE) The GeneXpert MRSA Assay (FDA approved for NASAL specimens only), is one component of a comprehensive MRSA colonization surveillance program. It is not intended to diagnose MRSA infection nor to guide or monitor treatment for MRSA infections. Test performance is not FDA approved in patients less than 51 years old. Performed at [facility], [place] [address] [place], [place] [address]     Radiology Studies: No results found.    Kel Senn T. [place]  If 7PM-7AM, please contact night-coverage [email or web address] 08/14/2020, 4:11 PM

## 2020-08-15 ENCOUNTER — Other Ambulatory Visit (HOSPITAL_COMMUNITY): Payer: Self-pay

## 2020-08-15 DIAGNOSIS — D509 Iron deficiency anemia, unspecified: Secondary | ICD-10-CM

## 2020-08-15 LAB — IRON AND TIBC
Iron: 23 ug/dL — ABNORMAL LOW (ref 28–170)
Saturation Ratios: 6 % — ABNORMAL LOW (ref 10.4–31.8)
TIBC: 403 ug/dL (ref 250–450)
UIBC: 380 ug/dL

## 2020-08-15 LAB — RENAL FUNCTION PANEL
Albumin: 3.3 g/dL — ABNORMAL LOW (ref 3.5–5.0)
Anion gap: 10 (ref 5–15)
BUN: 40 mg/dL — ABNORMAL HIGH (ref 8–23)
CO2: 24 mmol/L (ref 22–32)
Calcium: 9.2 mg/dL (ref 8.9–10.3)
Chloride: 102 mmol/L (ref 98–111)
Creatinine, Ser: 0.99 mg/dL (ref 0.44–1.00)
GFR, Estimated: 55 mL/min — ABNORMAL LOW (ref >60)
Glucose, Bld: 115 mg/dL — ABNORMAL HIGH (ref 70–99)
Phosphorus: 4.7 mg/dL — ABNORMAL HIGH (ref 2.5–4.6)
Potassium: 4.1 mmol/L (ref 3.5–5.1)
Sodium: 136 mmol/L (ref 135–145)

## 2020-08-15 LAB — CBC
HCT: 34 % — ABNORMAL LOW (ref 36.0–46.0)
Hemoglobin: 10.3 g/dL — ABNORMAL LOW (ref 12.0–15.0)
MCH: 27.8 pg (ref 26.0–34.0)
MCHC: 30.3 g/dL (ref 30.0–36.0)
MCV: 91.9 fL (ref 80.0–100.0)
Platelets: 582 10*3/uL — ABNORMAL HIGH (ref 150–400)
RBC: 3.7 MIL/uL — ABNORMAL LOW (ref 3.87–5.11)
RDW: 16.7 % — ABNORMAL HIGH (ref 11.5–15.5)
WBC: 13.7 10*3/uL — ABNORMAL HIGH (ref 4.0–10.5)
nRBC: 0 % (ref 0.0–0.2)

## 2020-08-15 LAB — MAGNESIUM: Magnesium: 2.2 mg/dL (ref 1.7–2.4)

## 2020-08-15 LAB — VITAMIN B12: Vitamin B-12: 803 pg/mL (ref 180–914)

## 2020-08-15 LAB — RETICULOCYTES
Immature Retic Fract: 30.3 % — ABNORMAL HIGH (ref 2.3–15.9)
RBC.: 3.65 MIL/uL — ABNORMAL LOW (ref 3.87–5.11)
Retic Count, Absolute: 87.2 10*3/uL (ref 19.0–186.0)
Retic Ct Pct: 2.4 % (ref 0.4–3.1)

## 2020-08-15 LAB — FERRITIN: Ferritin: 45 ng/mL (ref 11–307)

## 2020-08-15 LAB — FOLATE: Folate: 34.1 ng/mL (ref >5.9)

## 2020-08-15 MED ORDER — FUROSEMIDE 40 MG PO TABS
40.0000 mg | ORAL_TABLET | Freq: Every day | ORAL | Status: DC
  Administered 2020-08-15 – 2020-08-16 (×2): 40 mg via ORAL
  Filled 2020-08-15 (×2): qty 1

## 2020-08-15 MED ORDER — SODIUM CHLORIDE 0.9 % IV SOLN
250.0000 mg | Freq: Every day | INTRAVENOUS | Status: AC
  Administered 2020-08-15 – 2020-08-16 (×2): 250 mg via INTRAVENOUS
  Filled 2020-08-15 (×2): qty 20

## 2020-08-15 NOTE — TOC Progression Note (Signed)
Transition of Care [facility]) - Progression Note    Patient Details  Name: [name] [name] MRN: [id number] Date of Birth: [birth year]  Transition of [facility]) CM/SW Contact  Leeroy Cha, RN Phone Number: 08/15/2020, 10:42 AM  Clinical Narrative:    [facility]/ they had several patient test positive for COVID over the weekend and are unable to take anyone.  TCT-Son to return call.,   Expected Discharge Plan: [place] (vs. Home with home health) Barriers to Discharge: Barriers Resolved  Expected Discharge Plan and Services Expected Discharge Plan: [place] (vs. Home with home health) In-house Referral: Clinical Social Work     Living arrangements for the past 2 months: Single Family Home Expected Discharge Date: 08/14/20               DME Arranged: N/A DME Agency: NA                   Social Determinants of Health (SDOH) Interventions    Readmission Risk Interventions No flowsheet data found.

## 2020-08-15 NOTE — Progress Notes (Signed)
PROGRESS NOTE  [name] [name] XIP:[id number] DOB: May 20, 1929   PCP: [name], MD  Patient is from: Home  DOA: 08/04/2020 LOS: 11  Chief complaints:  No chief complaint on file.    Brief Narrative / Interim history: 87 year old F with PMH of HTN, HLD, A. fib on Xarelto, hearing loss, IDA and recent diagnosis of ileocecal adenocarcinoma who was admitted by general surgery and underwent laparoscopic right hemicolectomy 7/7.  Postoperatively, she became hypoxic and went into RVR and acute diastolic CHF.  Hospitalist service and cardiology consulted for further care.  Patient was diuresed with IV Lasix and loaded with amiodarone.  TTE with LVEF of 30 to 35% (from 60 to 65% previously), global hypokinesis, severe LAE, mod MVR, and mod pleural effusion,  Started on GDMT.  Eventually, improved and cleared for discharge by cardiology and general surgery.  Therapy recommended SNF.  See individual problem list below for more on hospital course.   Subjective: Seen and examined earlier this morning.  No major events overnight of this morning.  No complaints.  She denies chest pain, dyspnea, GI or UTI symptoms.  Objective: Vitals:   08/14/20 0548 08/14/20 1114 08/14/20 2111 08/15/20 0432  BP: 113/62 (!) 88/66 110/80 119/77  Pulse: 88 84 92 94  Resp:  16 18 17   Temp: 98.1 F (36.7 C) 97.7 F (36.5 C) 98 F (36.7 C) 97.6 F (36.4 C)  TempSrc: Oral Oral  Oral  SpO2: 97% 97% 92% 97%  Weight:      Height:        Intake/Output Summary (Last 24 hours) at 08/15/2020 1548 Last data filed at 08/15/2020 1000 Gross per 24 hour  Intake 360 ml  Output 1200 ml  Net -840 ml   Filed Weights   08/10/20 0500 08/11/20 0500 08/13/20 0521  Weight: 54.6 kg 56.3 kg 52.8 kg    Examination:  GENERAL: No apparent distress.  Nontoxic. HEENT: MMM.  Significantly diminished hearing. NECK: Supple.  No apparent JVD.  RESP:  No IWOB.  Fair aeration bilaterally. CVS: Irregular rhythm.  Normal  rate.[place] Kitchen Heart sounds normal.  ABD/GI/GU: BS+. Abd soft, NTND.  MSK/EXT:  Moves extremities. No apparent deformity. No edema.  SKIN: no apparent skin lesion or wound NEURO: Awake and alert. Oriented appropriately.  No apparent focal neuro deficit. PSYCH: Calm. Normal affect.   Procedures:  7/7-laparoscopic right hemicolectomy  Microbiology summarized: MRSA PCR screen negative.  Assessment & Plan: Paroxysmal A. fib with RVR: provoked in the setting of surgery and fluid overload.  -Cardiology gave recommendations and signed off -On amiodarone 400 mg twice daily in-house -Amiodarone 200 mg twice daily for 2 weeks followed by 200 mg daily -Continue bisoprolol -Continue Xarelto for anticoagulation  Acute on chronic systolic CHF-TTE with LVEF of 30 to 35% (from 60 to 65% previously), global hypokinesis, severe LAE, mod MVR, and mod pleural effusion.  Diuresis with IV Lasix and transition to p.o. on RA.  Appears euvolemic.  I&O incomplete.  Net -4 L charted.  Creatinine and BUN slightly up. -Decrease p.o. Lasix to 40 mg daily -GDMT-bisoprolol, losartan -Monitor fluid status and renal functions   Essential hypertension: Normotensive. -Cardiac meds as above   Metastatic adenocarcinoma of colon: s/p laparoscopic hemicolectomy.  Pathology positive in four of twenty-one lymph nodes.  -Outpatient follow-up with oncology -Recommend palliative follow-up at SNF  Iron deficiency anemia: H&H stable.  Iron sat 6%.  TIBC 403.  Ferritin 43. Recent Labs    05/05/20 1439 05/24/20 1218 07/15/20 1420 07/28/20  1516 08/05/20 0425 08/06/20 0404 08/07/20 0457 08/09/20 0245 08/12/20 0505 08/15/20 0429  HGB 9.5* 10.8* 9.1* 9.8* 9.1* 8.9* 10.8* 10.4* 10.5* 10.3*  -IV ferric gluconate 240 mg x 2 -Continue p.o. iron with bowel regimen  Diminished hearing-  Nutritional status Body mass index is 20.62 kg/m. -Consult dietitian     Pressure skin injury: Not POA Pressure Injury 08/07/20 Coccyx  Medial Stage 2 -  Partial thickness loss of dermis presenting as a shallow open injury with a red, pink wound bed without slough. Small pin sized open area on sacrum (Active)  08/07/20 0345  Location: Coccyx  Location Orientation: Medial  Staging: Stage 2 -  Partial thickness loss of dermis presenting as a shallow open injury with a red, pink wound bed without slough.  Wound Description (Comments): Small pin sized open area on sacrum  Present on Admission: No     Pressure Injury 08/10/20 Buttocks Right;Left;Medial Stage 1 -  Intact skin with non-blanchable redness of a localized area usually over a bony prominence. nonblancable redness (Active)  08/10/20 0900  Location: Buttocks (sacrum)  Location Orientation: Right;Left;Medial  Staging: Stage 1 -  Intact skin with non-blanchable redness of a localized area usually over a bony prominence.  Wound Description (Comments): nonblancable redness  Present on Admission:    DVT prophylaxis:  SCD's Start: 08/04/20 1600 Place TED hose Start: 08/04/20 1600 Rivaroxaban (XARELTO) tablet 15 mg  Code Status: Full code Family Communication: Patient and/or RN.  Updated patient's son over the phone on 7/17 Level of care: Telemetry Status is: Inpatient  Remains inpatient appropriate because:Unsafe d/c plan  Dispo:  Patient From: Home  Planned Disposition: [place]  Medically stable for discharge: Yes         Consultants:  General surgery Cardiology   Sch Meds:  Scheduled Meds:  amiodarone  400 mg Oral BID   bisoprolol  5 mg Oral Daily   Chlorhexidine Gluconate Cloth  6 each Topical Q0600   feeding supplement  237 mL Oral BID BM   fluticasone  1 spray Each Nare Daily   furosemide  40 mg Oral Daily   iron polysaccharides  150 mg Oral BID   lip balm  1 application Topical BID   losartan  25 mg Oral Daily   melatonin  3 mg Oral QHS   multivitamin with minerals  1 tablet Oral Daily   pantoprazole  40 mg Oral BID AC    polycarbophil  625 mg Oral BID   potassium chloride  20 mEq Oral Daily   rivaroxaban  15 mg Oral Q supper   Continuous Infusions:  ferric gluconate (FERRLECIT) IVPB 250 mg (08/15/20 1126)   PRN Meds:.alum & mag hydroxide-simeth, calcium carbonate, magic mouthwash, menthol-cetylpyridinium, ondansetron **OR** ondansetron (ZOFRAN) IV, phenol, simethicone, traMADol, traZODone  Antimicrobials: Anti-infectives (From admission, onward)    Start     Dose/Rate Route Frequency Ordered Stop   08/04/20 1400  neomycin (MYCIFRADIN) tablet 1,000 mg  Status:  Discontinued       See Hyperspace for full Linked Orders Report.   1,000 mg Oral 3 times per day 08/04/20 1043 08/04/20 1048   08/04/20 1400  metroNIDAZOLE (FLAGYL) tablet 1,000 mg  Status:  Discontinued       See Hyperspace for full Linked Orders Report.   1,000 mg Oral 3 times per day 08/04/20 1043 08/04/20 1048   08/04/20 0600  cefoTEtan (CEFOTAN) 2 g in sodium chloride 0.9 % 100 mL IVPB  2 g 200 mL/hr over 30 Minutes Intravenous On call to O.R. 08/03/20 0743 08/04/20 1256        I have personally reviewed the following labs and images: CBC: Recent Labs  Lab 08/09/20 0245 08/12/20 0505 08/15/20 0429  WBC 11.7* 12.4* 13.7*  NEUTROABS 7.3 8.0*  --   HGB 10.4* 10.5* 10.3*  HCT 34.4* 35.3* 34.0*  MCV 93.7 94.9 91.9  PLT 377 355 582*   BMP &GFR Recent Labs  Lab 08/09/20 0245 08/12/20 0505 08/15/20 0429  NA 138 137 136  K 3.7 4.7 4.1  CL 103 104 102  CO2 25 23 24   GLUCOSE 117* 109* 115*  BUN 30* 28* 40*  CREATININE 0.77 0.88 0.99  CALCIUM 9.1 8.9 9.2  MG  --  2.0 2.2  PHOS  --  4.0 4.7*   Estimated Creatinine Clearance: 31.9 mL/min (by C-G formula based on SCr of 0.99 mg/dL). Liver & Pancreas: Recent Labs  Lab 08/15/20 0429  ALBUMIN 3.3*   No results for input(s): LIPASE, AMYLASE in the last 168 hours. No results for input(s): AMMONIA in the last 168 hours. Diabetic: No results for input(s): HGBA1C in the  last 72 hours. No results for input(s): GLUCAP in the last 168 hours. Cardiac Enzymes: No results for input(s): CKTOTAL, CKMB, CKMBINDEX, TROPONINI in the last 168 hours. No results for input(s): PROBNP in the last 8760 hours. Coagulation Profile: No results for input(s): INR, PROTIME in the last 168 hours. Thyroid Function Tests: No results for input(s): TSH, T4TOTAL, FREET4, T3FREE, THYROIDAB in the last 72 hours. Lipid Profile: No results for input(s): CHOL, HDL, LDLCALC, TRIG, CHOLHDL, LDLDIRECT in the last 72 hours. Anemia Panel: Recent Labs    08/15/20 0429  VITAMINB12 803  FOLATE 34.1  FERRITIN 45  TIBC 403  IRON 23*  RETICCTPCT 2.4   Urine analysis:    Component Value Date/Time   COLORURINE YELLOW (A) 03/25/2020 1133   APPEARANCEUR CLOUDY (A) 03/25/2020 1133   APPEARANCEUR Cloudy 05/31/2012 2019   LABSPEC 1.018 03/25/2020 1133   LABSPEC 1.011 05/31/2012 2019   PHURINE 9.0 (H) 03/25/2020 1133   GLUCOSEU NEGATIVE 03/25/2020 1133   GLUCOSEU 50 mg/dL 05/31/2012 2019   HGBUR NEGATIVE 03/25/2020 [place] 03/25/2020 1133   BILIRUBINUR Negative 05/31/2012 2019   KETONESUR NEGATIVE 03/25/2020 1133   PROTEINUR NEGATIVE 03/25/2020 1133   NITRITE NEGATIVE 03/25/2020 1133   LEUKOCYTESUR NEGATIVE 03/25/2020 1133   LEUKOCYTESUR Negative 05/31/2012 2019   Sepsis Labs: Invalid input(s): PROCALCITONIN, [place]  Microbiology: Recent Results (from the past 240 hour(s))  MRSA Next Gen by PCR, Nasal     Status: None   Collection Time: 08/07/20  3:31 AM   Specimen: Nasal Mucosa; Nasal Swab  Result Value Ref Range Status   MRSA by PCR Next Gen NOT DETECTED NOT DETECTED Final    Comment: (NOTE) The GeneXpert MRSA Assay (FDA approved for NASAL specimens only), is one component of a comprehensive MRSA colonization surveillance program. It is not intended to diagnose MRSA infection nor to guide or monitor treatment for MRSA infections. Test performance is  not FDA approved in patients less than 28 years old. Performed at [facility], [place] [address] [place], [place] [address]     Radiology Studies: No results found.    Doloras [place] T. [place]  If 7PM-7AM, please contact night-coverage [email or web address] 08/15/2020, 3:48 PM

## 2020-08-15 NOTE — Progress Notes (Signed)
Physical Therapy Treatment Patient Details Name: [name] [name] MRN: [id number] DOB: [birth year] Today's Date: 08/15/2020    History of Present Illness Patient is an 87 year old female s/p R hemi colectomy due to colon CA. PMH includes HTN, HLD, afib, hearing loss.    PT Comments    Session broken into two visits. Pt attempting to eat breakfast in bed and having difficulty when therapist arrived. Pt required min assist to transfer supine>sit and stand step transfer to recliner to finish meal. On return for additional treatment min assist provided with cues for safe use of RW to ambulate ~120' in hallway. Pt also completed functional LE strengthening in chair. She will continue to benefit from skilled PT interventions to progress mobility. HR stable throughout with max of 125 bpm during gait and maintaining HR 105-118 bpm throughout most of mobility. Acute PT will continue to progress as able.    Follow Up Recommendations  SNF     Equipment Recommendations  None recommended by PT    Recommendations for Other Services       Precautions / Restrictions Precautions Precautions: Fall Precaution Comments: abd sx, deaf left ear, hearing aide right ear Restrictions Weight Bearing Restrictions: No    Mobility  Bed Mobility Overal bed mobility: Needs Assistance Bed Mobility: Rolling;Sidelying to Sit Rolling: Min assist Sidelying to sit: Min assist       General bed mobility comments: Assist for initiating/sequencing roll and press up to sit EOB.    Transfers Overall transfer level: Needs assistance Equipment used: Rolling walker (2 wheeled) Transfers: Sit to/from [facility] Sit to Stand: Min guard Stand pivot transfers: Min assist       General transfer comment: cues for hand placement. pt initiated rise and steady with standing. close guarding for safety. Assist first session for stand step/pivot to recliner to finish eating breakfast. Second session min  guard for sit<>stand from recliner for gait taining.  Ambulation/Gait Ambulation/Gait assistance: Min assist;Min guard Gait Distance (Feet): 120 Feet Assistive device: Rolling walker (2 wheeled) Gait Pattern/deviations: Step-through pattern;Decreased stride length;Narrow base of support Gait velocity: decr   General Gait Details: cues to maintain safe proximity to RW, assist intermittently to manage walker and steady. no overt LOB noted.   Stairs             Wheelchair Mobility    Modified Rankin (Stroke Patients Only)       Balance Overall balance assessment: Needs assistance Sitting-balance support: No upper extremity supported Sitting balance-Leahy Scale: Good     Standing balance support: During functional activity;Bilateral upper extremity supported Standing balance-Leahy Scale: Poor                              Cognition Arousal/Alertness: Awake/alert Behavior During Therapy: WFL for tasks assessed/performed Overall Cognitive Status: Within Functional Limits for tasks assessed                                        Exercises Other Exercises Other Exercises: 5 reps Sit<>Stand with bil UE use for power up from recliner.    General Comments        Pertinent Vitals/Pain Pain Assessment: Faces Faces Pain Scale: Hurts a little bit Pain Location: abdomen Pain Descriptors / Indicators: Grimacing Pain Intervention(s): Limited activity within patient's tolerance;Monitored during session;Repositioned    Home Living  Prior Function            PT Goals (current goals can now be found in the care plan section) Acute Rehab PT Goals Patient Stated Goal: to get strength back PT Goal Formulation: With patient Time For Goal Achievement: 08/19/20 Potential to Achieve Goals: Good Progress towards PT goals: Progressing toward goals    Frequency    Min 2X/week      PT Plan Current plan remains  appropriate    Co-evaluation              AM-PAC PT "6 Clicks" Mobility   Outcome Measure  Help needed turning from your back to your side while in a flat bed without using bedrails?: A Little Help needed moving from lying on your back to sitting on the side of a flat bed without using bedrails?: A Little Help needed moving to and from a bed to a chair (including a wheelchair)?: A Little Help needed standing up from a chair using your arms (e.g., wheelchair or bedside chair)?: A Little Help needed to walk in hospital room?: A Little Help needed climbing 3-5 steps with a railing? : A Lot 6 Click Score: 17    End of Session Equipment Utilized During Treatment: Gait belt Activity Tolerance: Patient tolerated treatment well Patient left: in chair;with call bell/phone within reach;with chair alarm set Nurse Communication: Mobility status PT Visit Diagnosis: Other abnormalities of gait and mobility (R26.89)       08/15/20 1800 08/15/20 1849  PT Time Calculation  PT Start Time (ACUTE ONLY) 0944 [id number]  PT Stop Time (ACUTE ONLY) 0954 1036  PT Time Calculation (min) (ACUTE ONLY) 10 min 18 min  PT General Charges  $$ ACUTE PT VISIT 1 Visit 1 Visit   PT Treatments  $Gait Training  --  8-22 mins  $Therapeutic Activity 8-22 mins  --        Verner Mould, DPT Acute Rehabilitation Services Office [phone] Pager [phone]    Jacques Navy 08/15/2020, 6:54 PM

## 2020-08-15 NOTE — Progress Notes (Signed)
11 Days Post-Op   Subjective/Chief Complaint: Late entry but seen this morning on rounds. No complaints aside from when she may be able to leave. She denies nausea, vomiting or abdominal pain. She continues to have good bowel function.   Objective: Vital signs in last 24 hours: Temp:  [97.6 F (36.4 C)-98.2 F (36.8 C)] 98.2 F (36.8 C) (07/18 1624) Pulse Rate:  [92-95] 95 (07/18 1624) Resp:  [17-18] 18 (07/18 1624) BP: (100-119)/(67-80) 100/67 (07/18 1624) SpO2:  [92 %-97 %] 96 % (07/18 1624) Last BM Date: 08/14/20  Intake/Output from previous day: 07/17 0701 - 07/18 0700 In: 120 [P.O.:120] Out: 1200 [Urine:1200] Intake/Output this shift: Total I/O In: 240 [P.O.:240] Out: -   General appearance: alert, cooperative, and sitting up in bed finshing pistachios GI: Abdomen     Lab Results:  Recent Labs    08/15/20 0429  WBC 13.7*  HGB 10.3*  HCT 34.0*  PLT 582*   BMET Recent Labs    08/15/20 0429  NA 136  K 4.1  CL 102  CO2 24  GLUCOSE 115*  BUN 40*  CREATININE 0.99  CALCIUM 9.2   PT/INR No results for input(s): LABPROT, INR in the last 72 hours. ABG No results for input(s): PHART, HCO3 in the last 72 hours.  Invalid input(s): PCO2, PO2  Studies/Results: No results found.  Anti-infectives: Anti-infectives (From admission, onward)    Start     Dose/Rate Route Frequency Ordered Stop   08/04/20 1400  neomycin (MYCIFRADIN) tablet 1,000 mg  Status:  Discontinued       See Hyperspace for full Linked Orders Report.   1,000 mg Oral 3 times per day 08/04/20 1043 08/04/20 1048   08/04/20 1400  metroNIDAZOLE (FLAGYL) tablet 1,000 mg  Status:  Discontinued       See Hyperspace for full Linked Orders Report.   1,000 mg Oral 3 times per day 08/04/20 1043 08/04/20 1048   08/04/20 0600  cefoTEtan (CEFOTAN) 2 g in sodium chloride 0.9 % 100 mL IVPB        2 g 200 mL/hr over 30 Minutes Intravenous On call to O.R. 08/03/20 0743 08/04/20 1256        Assessment/Plan: s/p Procedure(s): LAPAROSCOPIC RIGHT HEMI COLECTOMY (Right) -Continues to do well at this point -Our office is working on medical oncology referral as outpatient as well -She remains clear for next phase of care - greatly appreciate medicine's efforts in her care   LOS: 11 days   [name] 08/15/2020

## 2020-08-15 NOTE — Care Management Important Message (Signed)
Important Message  Patient Details IM Letter given to the Patient. Name: [name] [name] MRN: [id number] Date of Birth: 11/18/1929   Medicare Important Message Given:  Yes     Kerin Salen 08/15/2020, 9:51 AM

## 2020-08-16 ENCOUNTER — Encounter: Payer: Self-pay | Admitting: Oncology

## 2020-08-16 DIAGNOSIS — D500 Iron deficiency anemia secondary to blood loss (chronic): Secondary | ICD-10-CM

## 2020-08-16 DIAGNOSIS — I10 Essential (primary) hypertension: Secondary | ICD-10-CM

## 2020-08-16 LAB — RESP PANEL BY RT-PCR (FLU A&B, COVID) ARPGX2
Influenza A by PCR: NEGATIVE
Influenza B by PCR: NEGATIVE
SARS Coronavirus 2 by RT PCR: NEGATIVE

## 2020-08-16 NOTE — Discharge Summary (Signed)
Physician Discharge Summary  [name] [name] LFY:[id number] DOB: [birth year] DOA: 08/04/2020  PCP: [name], MD  Admit date: 08/04/2020 Discharge date: 08/16/2020  Admitted From: Home Disposition: SNF  Recommendations for Outpatient Follow-up:  Follow ups as below. Please obtain CBC/CMP/TSH/mag at follow up Recommend palliative follow-up at SNF Please follow up on the following pending results: None   Discharge Condition: Stable CODE STATUS: Full code   Contact information for follow-up providers     [name], MD Follow up in 2 week(s).   Specialties: General Surgery, Colon and Rectal Surgery Why: 2-3 weeks for postoperative appointment Contact information: [address] [place] [address] [phone]              Contact information for after-discharge care     Destination     [facility] .   Service: Skilled Nursing Contact information: [place] [phone]                      Hospital Course: 87 year old F with PMH of HTN, HLD, A. fib on Xarelto, hearing loss, IDA and recent diagnosis of ileocecal adenocarcinoma who was admitted by general surgery and underwent laparoscopic right hemicolectomy on 7/7.  Postoperatively, she became hypoxic and went into RVR and acute diastolic CHF.  Hospitalist service and cardiology consulted for further care.  Patient was diuresed with IV Lasix and loaded with amiodarone.  TTE with LVEF of 30 to 35% (from 60 to 65% previously), global hypokinesis, severe LAE, mod MVR, and mod pleural effusion.  Started on GDMT as well.  Eventually, improved and transition to p.o. Lasix.  She has been cleared for discharge by cardiology and general surgery.  Therapy recommended SNF.   See individual problem list below for more on hospital course.  Discharge Diagnoses:  Persistent A. fib with RVR: provoked by surgical stress and  fluid overload?  Irregular rhythm but rate controlled.  TTE as below. -Cardiology gave recommendations and signed off -Amiodarone 200 mg twice daily for 2 weeks through 7/30, then once daily -Continue bisoprolol -Continue Xarelto for anticoagulation   Acute on chronic systolic CHF-TTE with LVEF of 30 to 35% (from 60 to 65% previously), global hypokinesis, severe LAE, mod MVR, and mod pleural effusion.  Diuresis with IV Lasix and transition to p.o. on RA.  Appears euvolemic.  I&O incomplete.  Net -4 L charted.  Creatinine and BUN slightly up but stable. -Reduce Lasix to 40 mg daily -GDMT-bisoprolol, losartan -Monitor fluid status and renal functions -Recheck renal function and magnesium in 1 to 2 weeks   Essential hypertension: Normotensive. -Cardiac meds as above   Metastatic adenocarcinoma of colon: s/p laparoscopic hemicolectomy.  Pathology positive in four of twenty-one lymph nodes. -Outpatient follow-up with general surgery and oncology -Recommend palliative follow-up at SNF   Iron deficiency anemia: H&H stable.  Iron sat 6%.  TIBC 403.  Ferritin 43. -IV ferric gluconate 240 mg x 2 on 7/18 -Continue p.o. iron with bowel regimen   Diminished hearing-   Nutritional status Body mass index is 20.15 kg/m.  Pressure skin injury: Not POA. -Recommend offloading and monitoring Pressure Injury 08/07/20 Coccyx Medial Stage 2 -  Partial thickness loss of dermis presenting as a shallow open injury with a red, pink wound bed without slough. Small pin sized open area on sacrum (Active)  08/07/20 0345  Location: Coccyx  Location Orientation: Medial  Staging: Stage 2 -  Partial thickness loss of dermis presenting as a shallow open injury with a red, pink wound bed without slough.  Wound Description (Comments): Small pin sized open area on sacrum  Present on Admission: No     Pressure Injury 08/10/20 Buttocks Right;Left;Medial Stage 1 -  Intact skin with non-blanchable redness of a localized  area usually over a bony prominence. nonblancable redness (Active)  08/10/20 0900  Location: Buttocks (sacrum)  Location Orientation: Right;Left;Medial  Staging: Stage 1 -  Intact skin with non-blanchable redness of a localized area usually over a bony prominence.  Wound Description (Comments): nonblancable redness  Present on Admission:     Discharge Exam: Vitals:   08/15/20 1942 08/16/20 0540  BP: (!) 104/91 124/70  Pulse: 83 62  Resp: 17 16  Temp: 97.8 F (36.6 C) 98.3 F (36.8 C)  SpO2: 94% 97%    GENERAL: No apparent distress.  Nontoxic. HEENT: MMM.  Vision grossly intact.  Diminished hearing. NECK: Supple.  No apparent JVD.  RESP: On room air.  No IWOB.  Fair aeration bilaterally. CVS: Irregular rhythm.  Normal rate.  Heart sounds normal.  ABD/GI/GU: Bowel sounds present. Soft. Non tender.  MSK/EXT:  Moves extremities. No apparent deformity. No edema.  SKIN: no apparent skin lesion or wound NEURO: Awake, alert and oriented appropriately.  No apparent focal neuro deficit. PSYCH: Calm. Normal affect.   Discharge Instructions  Discharge Instructions     Diet - low sodium heart healthy   Complete by: As directed    Increase activity slowly   Complete by: As directed    No wound care   Complete by: As directed       Allergies as of 08/16/2020       Reactions   Metoprolol Swelling   Feet, ankles and legs    Bactrim [sulfamethoxazole-trimethoprim] Other (See Comments)   Hearing loss   Ciprofloxacin Other (See Comments)   Flu-like symptoms   Pneumococcal Vaccines Other (See Comments)   Flu-like symptoms        Medication List     STOP taking these medications    Hair/Skin/Nails Caps   verapamil 180 MG CR tablet Commonly known as: CALAN-SR       TAKE these medications    amiodarone 200 MG tablet Commonly known as: PACERONE Take 1 tablet (200 mg total) by mouth 2 (two) times daily for 14 days. What changed: when to take this   amiodarone 200  MG tablet Commonly known as: PACERONE Take 1 tablet (200 mg total) by mouth daily. Start taking on: August 28, 2020 What changed: You were already taking a medication with the same name, and this prescription was added. Make sure you understand how and when to take each.   bisoprolol 5 MG tablet Commonly known as: ZEBETA Take 1 tablet (5 mg total) by mouth daily.   calcium carbonate 500 MG chewable tablet Commonly known as: TUMS - dosed in mg elemental calcium Chew 500 mg by mouth daily as needed for indigestion or heartburn.   fluticasone 50 MCG/ACT nasal spray Commonly known as: FLONASE Place 1 spray into both nostrils daily.   furosemide 40 MG tablet Commonly known as: LASIX Take 1 tablet (40 mg total) by mouth daily.   iron polysaccharides 150 MG capsule Commonly known as: NIFEREX Take 1 capsule (150 mg total) by mouth daily. What changed: when to take this   losartan 25 MG tablet Commonly known as: COZAAR Take 1 tablet (25 mg total) by mouth daily.  lovastatin 40 MG tablet Commonly known as: MEVACOR Take 40 mg by mouth every evening.   melatonin 3 MG Tabs tablet Take 1 tablet (3 mg total) by mouth at bedtime.   multivitamin with minerals Tabs tablet Take 1 tablet by mouth daily. What changed: Another medication with the same name was added. Make sure you understand how and when to take each.   multivitamin with minerals Tabs tablet Take 1 tablet by mouth daily. What changed: You were already taking a medication with the same name, and this prescription was added. Make sure you understand how and when to take each.   pantoprazole 40 MG tablet Commonly known as: PROTONIX Take 1 tablet (40 mg total) by mouth 2 (two) times daily before a meal. What changed:  when to take this reasons to take this   polyethylene glycol powder 17 GM/SCOOP powder Commonly known as: GLYCOLAX/MIRALAX Take 17 g by mouth 2 (two) times daily as needed for moderate constipation.    potassium chloride 20 MEQ packet Commonly known as: KLOR-CON Take 20 mEq by mouth daily.   Rivaroxaban 15 MG Tabs tablet Commonly known as: XARELTO Take 1 tablet (15 mg total) by mouth daily with supper.   SYSTANE OP Place 1 drop into both eyes in the morning and at bedtime.   tretinoin 0.05 % cream Commonly known as: RETIN-A Apply 1 application topically at bedtime.       ASK your doctor about these medications    acetaminophen 500 MG tablet Commonly known as: TYLENOL Take 1 tablet (500 mg total) by mouth every 6 (six) hours as needed.   traMADol 50 MG tablet Commonly known as: Ultram Take 1 tablet (50 mg total) by mouth every 6 (six) hours as needed for up to 5 days. Ask about: Should I take this medication?               Durable Medical Equipment  (From admission, onward)           Start     Ordered   08/14/20 0812  For home use only DME Bedside commode  Once       Question:  Patient needs a bedside commode to treat with the following condition  Answer:  Generalized weakness   08/14/20 0811            Consultations: General surgery Cardiology  Procedures/Studies: 08/04/2020-laparoscopic hemicolectomy   DG CHEST PORT 1 VIEW  Result Date: 08/07/2020 CLINICAL DATA:  Shortness of breath EXAM: PORTABLE CHEST 1 VIEW COMPARISON:  03/31/2020 FINDINGS: Cardiac shadow is within normal limits. Aortic calcifications are seen. Increased vascular congestion is noted with bilateral pleural effusions left greater than right and mild interstitial edema consistent with CHF. No bony abnormality is noted. IMPRESSION: Changes of CHF with bilateral effusions and pulmonary edema. Electronically Signed   By: Inez Catalina M.D.   On: 08/07/2020 00:22   ECHOCARDIOGRAM COMPLETE  Result Date: 08/08/2020    ECHOCARDIOGRAM REPORT   Patient Name:   MASAE LUKACS Date of Exam: 08/08/2020 Medical Rec #:  [id number]           Height:       63.0 in Accession #:    [id number]           Weight:       113.8 lb Date of Birth:  1929-09-08           BSA:          1.521 m Patient Age:  87 years            BP:           159/130 mmHg Patient Gender: F                   HR:           119 bpm. Exam Location:  Inpatient Procedure: 2D Echo, Color Doppler and Cardiac Doppler Indications:    CHF-Acute Diastolic A25.05  History:        Patient has prior history of Echocardiogram examinations, most                 recent 03/27/2020. Risk Factors:Hypertension.  Sonographer:    Bernadene Person RDCS Referring Phys: [id number] [place]  1. Patient is in Afib with RVR during study.  2. Left ventricular ejection fraction, by estimation, is 30 to 35%. The left ventricle has moderately decreased function. There is global hypokinesis that appears slightly worse in the basal-to-apical inferoseptal and basal-to-mid anteroseptal segments.  There is mild asymmetric left ventricular hypertrophy of the basal-septal segment. Diastolic function is indeterminant due to atrial fibrillation.  3. Right ventricular systolic function is moderately reduced. The right ventricular size is normal. There is normal pulmonary artery systolic pressure. The estimated right ventricular systolic pressure is 19.3 mmHg.  4. Left atrial size was severely dilated.  5. Right atrial size was mildly dilated.  6. Moderate pleural effusion in the left lateral region.  7. The mitral valve is myxomatous. Moderate mitral valve regurgitation.  8. The aortic valve is tricuspid. There is mild calcification of the aortic valve. There is mild thickening of the aortic valve. Aortic valve regurgitation is trivial. Mild aortic valve sclerosis is present, with no evidence of aortic valve stenosis.  9. The inferior vena cava is normal in size with greater than 50% respiratory variability, suggesting right atrial pressure of 3 mmHg. Comparison(s): Compared to prior TTE in 03/27/2020, the LVEF has dropped to 30-35% (previously 60-65%) in the setting  of Afib with RVR. The mitral valve thickening is better appreciated on prior study. There continues to be moderate MR and a pleural effusion. FINDINGS  Left Ventricle: Left ventricular ejection fraction, by estimation, is 30 to 35%. The left ventricle has moderately decreased function. There is global hypokinesis that appears slightly worse in the basal-to-apical inferoseptal and basal-to-mid anteroseptal segments. The left ventricular internal cavity size was normal in size. There is mild asymmetric left ventricular hypertrophy of the basal-septal segment. Diastolic function is indeterminant due to atrial fibrillation. Right Ventricle: The right ventricular size is normal. Right vetricular wall thickness was not well visualized. Right ventricular systolic function is moderately reduced. There is normal pulmonary artery systolic pressure. The tricuspid regurgitant velocity is 2.35 m/s, and with an assumed right atrial pressure of 3 mmHg, the estimated right ventricular systolic pressure is 79.0 mmHg. Left Atrium: Left atrial size was severely dilated. Right Atrium: Right atrial size was mildly dilated. Pericardium: There is no evidence of pericardial effusion. Mitral Valve: The mitral valve is myxomatous. Mild to moderate mitral annular calcification. Moderate mitral valve regurgitation. Tricuspid Valve: The tricuspid valve is normal in structure. Tricuspid valve regurgitation is trivial. Aortic Valve: The aortic valve is tricuspid. There is mild calcification of the aortic valve. There is mild thickening of the aortic valve. Aortic valve regurgitation is trivial. Aortic regurgitation PHT measures 639 msec. Mild aortic valve sclerosis is present, with no evidence of aortic valve stenosis. Pulmonic Valve: The pulmonic valve  was normal in structure. Pulmonic valve regurgitation is trivial. Aorta: The aortic root and ascending aorta are structurally normal, with no evidence of dilitation. Venous: The inferior vena cava  is normal in size with greater than 50% respiratory variability, suggesting right atrial pressure of 3 mmHg. IAS/Shunts: No atrial level shunt detected by color flow Doppler. Additional Comments: There is a moderate pleural effusion in the left lateral region.  LEFT VENTRICLE PLAX 2D LVIDd:         4.20 cm LVIDs:         3.20 cm LV PW:         1.00 cm LV IVS:        0.80 cm LVOT diam:     1.90 cm LV SV:         27 LV SV Index:   18 LVOT Area:     2.84 cm  LV Volumes (MOD) LV vol d, MOD A2C: 81.0 ml LV vol d, MOD A4C: 66.6 ml LV vol s, MOD A2C: 44.0 ml LV vol s, MOD A4C: 34.2 ml LV SV MOD A2C:     37.0 ml LV SV MOD A4C:     66.6 ml LV SV MOD BP:      35.1 ml RIGHT VENTRICLE TAPSE (M-mode): 1.3 cm LEFT ATRIUM           Index       RIGHT ATRIUM           Index LA diam:      4.20 cm 2.76 cm/m  RA Area:     12.70 cm LA Vol (A2C): 50.6 ml 33.26 ml/m RA Volume:   32.40 ml  21.30 ml/m LA Vol (A4C): 53.4 ml 35.10 ml/m  AORTIC VALVE LVOT Vmax:   70.53 cm/s LVOT Vmean:  50.300 cm/s LVOT VTI:    0.096 m AI PHT:      639 msec  AORTA Ao Root diam: 2.90 cm Ao Asc diam:  3.00 cm MR Peak grad:    102.4 mmHg  TRICUSPID VALVE MR Mean grad:    66.0 mmHg   TR Peak grad:   22.1 mmHg MR Vmax:         506.00 cm/s TR Vmax:        235.00 cm/s MR Vmean:        384.0 cm/s MR PISA:         0.57 cm    SHUNTS MR PISA Eff ROA: 4 mm       Systemic VTI:  0.10 m MR PISA Radius:  0.30 cm     Systemic Diam: 1.90 cm Gwyndolyn Kaufman MD Electronically signed by Gwyndolyn Kaufman MD Signature Date/Time: 08/08/2020/11:52:53 AM    Final        The results of significant diagnostics from this hospitalization (including imaging, microbiology, ancillary and laboratory) are listed below for reference.     Microbiology: Recent Results (from the past 240 hour(s))  MRSA Next Gen by PCR, Nasal     Status: None   Collection Time: 08/07/20  3:31 AM   Specimen: Nasal Mucosa; Nasal Swab  Result Value Ref Range Status   MRSA by PCR Next Gen NOT  DETECTED NOT DETECTED Final    Comment: (NOTE) The GeneXpert MRSA Assay (FDA approved for NASAL specimens only), is one component of a comprehensive MRSA colonization surveillance program. It is not intended to diagnose MRSA infection nor to guide or monitor treatment for MRSA infections. Test performance is not FDA approved in  patients less than 36 years old. Performed at [facility], [place] [address] [place], [place] [address]      Labs:  CBC: Recent Labs  Lab 08/12/20 0505 08/15/20 0429  WBC 12.4* 13.7*  NEUTROABS 8.0*  --   HGB 10.5* 10.3*  HCT 35.3* 34.0*  MCV 94.9 91.9  PLT 355 582*   BMP &GFR Recent Labs  Lab 08/12/20 0505 08/15/20 0429  NA 137 136  K 4.7 4.1  CL 104 102  CO2 23 24  GLUCOSE 109* 115*  BUN 28* 40*  CREATININE 0.88 0.99  CALCIUM 8.9 9.2  MG 2.0 2.2  PHOS 4.0 4.7*   Estimated Creatinine Clearance: 31.4 mL/min (by C-G formula based on SCr of 0.99 mg/dL). Liver & Pancreas: Recent Labs  Lab 08/15/20 0429  ALBUMIN 3.3*   No results for input(s): LIPASE, AMYLASE in the last 168 hours. No results for input(s): AMMONIA in the last 168 hours. Diabetic: No results for input(s): HGBA1C in the last 72 hours. No results for input(s): GLUCAP in the last 168 hours. Cardiac Enzymes: No results for input(s): CKTOTAL, CKMB, CKMBINDEX, TROPONINI in the last 168 hours. No results for input(s): PROBNP in the last 8760 hours. Coagulation Profile: No results for input(s): INR, PROTIME in the last 168 hours. Thyroid Function Tests: No results for input(s): TSH, T4TOTAL, FREET4, T3FREE, THYROIDAB in the last 72 hours. Lipid Profile: No results for input(s): CHOL, HDL, LDLCALC, TRIG, CHOLHDL, LDLDIRECT in the last 72 hours. Anemia Panel: Recent Labs    08/15/20 0429  VITAMINB12 803  FOLATE 34.1  FERRITIN 45  TIBC 403  IRON 23*  RETICCTPCT 2.4   Urine analysis:    Component Value Date/Time   COLORURINE YELLOW (A) 03/25/2020  1133   APPEARANCEUR CLOUDY (A) 03/25/2020 1133   APPEARANCEUR Cloudy 05/31/2012 2019   LABSPEC 1.018 03/25/2020 1133   LABSPEC 1.011 05/31/2012 2019   PHURINE 9.0 (H) 03/25/2020 1133   GLUCOSEU NEGATIVE 03/25/2020 1133   GLUCOSEU 50 mg/dL 05/31/2012 2019   HGBUR NEGATIVE 03/25/2020 [place] 03/25/2020 1133   BILIRUBINUR Negative 05/31/2012 2019   KETONESUR NEGATIVE 03/25/2020 1133   PROTEINUR NEGATIVE 03/25/2020 1133   NITRITE NEGATIVE 03/25/2020 1133   LEUKOCYTESUR NEGATIVE 03/25/2020 1133   LEUKOCYTESUR Negative 05/31/2012 2019   Sepsis Labs: Invalid input(s): PROCALCITONIN, LACTICIDVEN   Time coordinating discharge: 40 minutes  SIGNED:  Mercy Riding, MD  [facility] 08/16/2020, 11:48 AM  If 7PM-7AM, please contact night-coverage [email or web address]

## 2020-08-16 NOTE — Progress Notes (Signed)
Mobility Specialist - Progress Note   08/16/20 1346  Mobility  Activity Ambulated in hall;Ambulated to bathroom  Level of Assistance Minimal assist, patient does 75% or more  Assistive Device Front wheel walker  Distance Ambulated (ft) 50 ft  Mobility Ambulated with assistance in room;Ambulated with assistance in hallway  Mobility Response Tolerated well  Mobility performed by Mobility specialist  $Mobility charge 1 Mobility    Pre-mobility: 93% SpO2 During mobility: 96%  SpO2 Post-mobility: 98% SPO2  Pt required Min A when standing up. Pt ambulated to bathroom using RW with Min A. Pt then took 1 sitting rest break before ambulating in hallway. SPO2 levels were 93% before ambulating to hall. Pt stood up using Min A from mobility tech. Pt then ambulated 50 ft in hallway using RW and maintained SPO2 levels of 96% before coming back to room. Pt returned to chair with NT in room and call bell by her side.   [place] Specialist Acute [facility] Services Phone: [phone] 08/16/20, 1:51 PM

## 2020-08-16 NOTE — Progress Notes (Signed)
Patient left via [facility], escorted by [facility] staff. IV removed and patient comfortable. VS given to staff and discharge packet.

## 2020-08-16 NOTE — TOC Progression Note (Signed)
Transition of Care [facility]) - Progression Note    Patient Details  Name: [name] [name] MRN: [id number] Date of Birth: 1929/07/22  [facility]) CM/SW Contact  Purcell Mouton, RN Phone Number: 08/16/2020, 12:20 PM  Clinical Narrative:     Pt's son Percell Miller was called and aware that pt will transfer to [facility]. [facility] was called.   Expected Discharge Plan: [place] (vs. Home with home health) Barriers to Discharge: Barriers Resolved  Expected Discharge Plan and Services Expected Discharge Plan: [place] (vs. Home with home health) In-house Referral: Clinical Social Work     Living arrangements for the past 2 months: Single Family Home Expected Discharge Date: 08/16/20               DME Arranged: N/A DME Agency: NA                   Social Determinants of Health (SDOH) Interventions    Readmission Risk Interventions No flowsheet data found.

## 2020-08-16 NOTE — Care Management Note (Signed)
[id number]/rec'd call from son after 5 pm on [id number], will allow patient to go to [facility].

## 2020-08-17 ENCOUNTER — Other Ambulatory Visit: Payer: Self-pay

## 2020-08-17 NOTE — Progress Notes (Signed)
The proposed treatment discussed in conference is for discussion purpose only and is not a binding recommendation.  The patients have not been physically examined, or presented with their treatment options.  Therefore, final treatment plans cannot be decided.  

## 2020-08-19 ENCOUNTER — Telehealth: Payer: Self-pay | Admitting: Hematology

## 2020-08-19 NOTE — Telephone Encounter (Signed)
Scheduled appt per referral. Pt's son aware.

## 2020-08-22 ENCOUNTER — Encounter: Payer: Self-pay | Admitting: Oncology

## 2020-08-23 ENCOUNTER — Encounter: Payer: Self-pay | Admitting: Pharmacist

## 2020-08-23 ENCOUNTER — Encounter: Payer: Self-pay | Admitting: Oncology

## 2020-08-23 ENCOUNTER — Other Ambulatory Visit: Payer: Self-pay

## 2020-08-23 ENCOUNTER — Telehealth: Payer: Self-pay | Admitting: Pharmacy Technician

## 2020-08-23 ENCOUNTER — Inpatient Hospital Stay: Payer: Medicare Other | Attending: Oncology | Admitting: Oncology

## 2020-08-23 ENCOUNTER — Inpatient Hospital Stay: Payer: Medicare Other

## 2020-08-23 ENCOUNTER — Inpatient Hospital Stay: Payer: Medicare Other | Admitting: Pharmacist

## 2020-08-23 ENCOUNTER — Other Ambulatory Visit (HOSPITAL_COMMUNITY): Payer: Self-pay

## 2020-08-23 VITALS — BP 119/60 | HR 49 | Temp 97.8°F | Ht 63.0 in | Wt 111.0 lb

## 2020-08-23 DIAGNOSIS — M8580 Other specified disorders of bone density and structure, unspecified site: Secondary | ICD-10-CM | POA: Diagnosis not present

## 2020-08-23 DIAGNOSIS — Z9049 Acquired absence of other specified parts of digestive tract: Secondary | ICD-10-CM | POA: Insufficient documentation

## 2020-08-23 DIAGNOSIS — I110 Hypertensive heart disease with heart failure: Secondary | ICD-10-CM | POA: Diagnosis not present

## 2020-08-23 DIAGNOSIS — I5032 Chronic diastolic (congestive) heart failure: Secondary | ICD-10-CM | POA: Diagnosis not present

## 2020-08-23 DIAGNOSIS — C189 Malignant neoplasm of colon, unspecified: Secondary | ICD-10-CM

## 2020-08-23 DIAGNOSIS — C182 Malignant neoplasm of ascending colon: Secondary | ICD-10-CM | POA: Diagnosis present

## 2020-08-23 DIAGNOSIS — Z7189 Other specified counseling: Secondary | ICD-10-CM | POA: Diagnosis not present

## 2020-08-23 DIAGNOSIS — Z7901 Long term (current) use of anticoagulants: Secondary | ICD-10-CM | POA: Diagnosis not present

## 2020-08-23 DIAGNOSIS — I4891 Unspecified atrial fibrillation: Secondary | ICD-10-CM | POA: Diagnosis not present

## 2020-08-23 DIAGNOSIS — E7800 Pure hypercholesterolemia, unspecified: Secondary | ICD-10-CM | POA: Insufficient documentation

## 2020-08-23 DIAGNOSIS — Z79899 Other long term (current) drug therapy: Secondary | ICD-10-CM | POA: Insufficient documentation

## 2020-08-23 NOTE — Telephone Encounter (Signed)
Encounter opened in error

## 2020-08-23 NOTE — Progress Notes (Signed)
Pt has medications issues with the needs refill for eliquis and amiodarone and told pt and son that they need to call cardiac md and PCP so they would get the refill from Dr. Netty Starring and her cardiologist

## 2020-08-23 NOTE — Progress Notes (Signed)
[place]  Telephone:([phone] [phone]  Patient Care Team: [name], MD as PCP - General (Family Medicine) Clent Jacks, RN as Oncology Nurse Navigator [name], MD as Consulting Physician (Colon and Rectal Surgery) Lin Landsman, MD as Consulting Physician (Gastroenterology)   Name of the patient: [name] [name]  ME:[id number]  31-Oct-1929   Date of visit: 08/23/20  HPI: Patient is a 87 y.o. female with colon cancer s/p hemicolectomy. Patient is consider Xeloda (capecitabine) adjuvant treatment.   Reason for Consult: Xeloda (capecitabine) oral chemotherapy education.   PAST MEDICAL HISTORY: Past Medical History:  Diagnosis Date   Anemia    Anxiety    Cancer ([place])    Squamous Cell Carcinoma (top of head)   Colon cancer ([place])    Constipation, chronic    Deaf, left    Diverticular disease    GERD (gastroesophageal reflux disease)    Hearing aid worn    Hearing deficit    History of Bell's palsy    left side - resolved - 50 yrs ago   Hypertension    Internal hemorrhoids    Iron deficiency anemia    Mitral regurgitation    moderate to severe on 03/27/20 echo   Osteopenia    Pure hypercholesterolemia     HEMATOLOGY/ONCOLOGY HISTORY:  Oncology History   No history exists.    ALLERGIES:  is allergic to metoprolol, bactrim [sulfamethoxazole-trimethoprim], ciprofloxacin, and pneumococcal vaccines.  MEDICATIONS:  Current Outpatient Medications  Medication Sig Dispense Refill   acetaminophen (TYLENOL) 500 MG tablet Take 1 tablet (500 mg total) by mouth every 6 (six) hours as needed. (Patient taking differently: Take 1,000 mg by mouth every 6 (six) hours as needed for moderate pain.) 30 tablet 0   amiodarone (PACERONE) 200 MG tablet Take 1 tablet (200 mg total) by mouth 2 (two) times daily for 14 days. (Patient not taking: Reported on 08/23/2020) 28 tablet 0   [START ON  08/28/2020] amiodarone (PACERONE) 200 MG tablet Take 1 tablet (200 mg total) by mouth daily. (Patient not taking: Reported on 08/23/2020) 30 tablet 2   bisoprolol (ZEBETA) 5 MG tablet Take 1 tablet (5 mg total) by mouth daily. (Patient not taking: Reported on 08/23/2020) 30 tablet 0   calcium carbonate (TUMS - DOSED IN MG ELEMENTAL CALCIUM) 500 MG chewable tablet Chew 500 mg by mouth daily as needed for indigestion or heartburn.     fluticasone (FLONASE) 50 MCG/ACT nasal spray Place 1 spray into both nostrils daily.     furosemide (LASIX) 40 MG tablet Take 1 tablet (40 mg total) by mouth daily. 30 tablet 1   iron polysaccharides (NIFEREX) 150 MG capsule Take 1 capsule (150 mg total) by mouth daily. 30 capsule 0   losartan (COZAAR) 25 MG tablet Take 1 tablet (25 mg total) by mouth daily. 30 tablet 0   lovastatin (MEVACOR) 40 MG tablet Take 40 mg by mouth every evening.     melatonin 3 MG TABS tablet Take 1 tablet (3 mg total) by mouth at bedtime. (Patient not taking: Reported on 08/23/2020)  0   pantoprazole (PROTONIX) 40 MG tablet Take 1 tablet (40 mg total) by mouth 2 (two) times daily before a meal. (Patient taking differently: Take 40 mg by mouth 2 (two) times daily as needed (acid reflux).) 60 tablet 2   Polyethyl Glycol-Propyl Glycol (SYSTANE OP) Place 1 drop into both eyes in the morning and at bedtime.  polyethylene glycol powder (GLYCOLAX/MIRALAX) 17 GM/SCOOP powder Take 17 g by mouth 2 (two) times daily as needed for moderate constipation.     potassium chloride (KLOR-CON) 20 MEQ packet Take 20 mEq by mouth daily. 30 packet 0   Rivaroxaban (XARELTO) 15 MG TABS tablet Take 1 tablet (15 mg total) by mouth daily with supper. (Patient not taking: Reported on 08/23/2020) 42 tablet    tretinoin (RETIN-A) 0.05 % cream Apply 1 application topically at bedtime.     No current facility-administered medications for this visit.    VITAL SIGNS: There were no vitals taken for this visit. There were no  vitals filed for this visit.  Estimated body mass index is 19.66 kg/m as calculated from the following:   Height as of an earlier encounter on 08/23/20: '5\' 3"'$  (1.6 m).   Weight as of an earlier encounter on 08/23/20: 50.3 kg (111 lb).  LABS: CBC:    Component Value Date/Time   WBC 13.7 (H) 08/15/2020 0429   HGB 10.3 (L) 08/15/2020 0429   HGB 10.0 (L) 06/04/2012 0510   HCT 34.0 (L) 08/15/2020 0429   HCT 28.8 (L) 06/04/2012 0510   PLT 582 (H) 08/15/2020 0429   PLT 170 06/04/2012 0510   MCV 91.9 08/15/2020 0429   MCV 90 06/04/2012 0510   NEUTROABS 8.0 (H) 08/12/2020 0505   NEUTROABS 7.3 (H) 06/04/2012 0510   LYMPHSABS 2.0 08/12/2020 0505   LYMPHSABS 2.0 06/04/2012 0510   MONOABS 1.4 (H) 08/12/2020 0505   MONOABS 1.9 (H) 06/04/2012 0510   EOSABS 0.9 (H) 08/12/2020 0505   EOSABS 0.3 06/04/2012 0510   BASOSABS 0.1 08/12/2020 0505   BASOSABS 0.1 06/04/2012 0510   Comprehensive Metabolic Panel:    Component Value Date/Time   NA 136 08/15/2020 0429   NA 137 07/08/2012 0633   K 4.1 08/15/2020 0429   K 4.1 07/08/2012 0633   CL 102 08/15/2020 0429   CL 102 07/08/2012 0633   CO2 24 08/15/2020 0429   CO2 31 07/08/2012 0633   BUN 40 (H) 08/15/2020 0429   BUN 16 07/08/2012 0633   CREATININE 0.99 08/15/2020 0429   CREATININE 0.73 07/08/2012 0633   GLUCOSE 115 (H) 08/15/2020 0429   GLUCOSE 74 07/08/2012 0633   CALCIUM 9.2 08/15/2020 0429   CALCIUM 9.5 07/08/2012 0633   AST 21 07/28/2020 1516   AST 24 05/31/2012 2019   ALT 16 07/28/2020 1516   ALT 26 05/31/2012 2019   ALKPHOS 71 07/28/2020 1516   ALKPHOS 52 05/31/2012 2019   BILITOT 0.3 07/28/2020 1516   BILITOT 0.4 05/31/2012 2019   PROT 7.3 07/28/2020 1516   PROT 8.1 05/31/2012 2019   ALBUMIN 3.3 (L) 08/15/2020 0429   ALBUMIN 4.0 05/31/2012 2019     Present during today's visit: patient and her son Percell Miller  Assessment and Plan: Start plan: Patient is still undecided about whether to proceed with capecitabine. They will  take the next 2 weeks to decide. Provided Percell Miller with my contact information, they will call when they have made a decision about treatment.   Patient Education I spoke with patient for overview of new oral chemotherapy medication: Xeloda (capecitabine) for the adjuvant treatment of colon cancer, planned duration of 6 months.   Administration: Counseled patient on administration, dosing, side effects, monitoring, drug-food interactions, safe handling, storage, and disposal.  Side Effects: Side effects include but not limited to: diarrhea, hand-foot syndrome, edema, decreased wbc, fatigue, N/V, mouth sores.    Adherence: After discussion  with patient no patient barriers to medication adherence identified.  Reviewed with patient importance of keeping a medication schedule and plan for any missed doses.  Ms. Philpot and Percell Miller voiced understanding and appreciation. All questions answered. Medication handout provided.  Provided patient with [facility] phone number. Patient knows to call the office with questions or concerns. [facility] will continue to follow.  Patient expressed understanding and was in agreement with this plan. She also understands that She can call clinic at any time with any questions, concerns, or complaints.   Medication Access Issues: Will begin working in patient capecitabine medication access in the case she decides to proceed with treatment  Follow-up plan: f/u pending patient treatment decision  Thank you for allowing me to participate in the care of this patient.   Time Total: 30 minutes  Visit consisted of counseling and education on dealing with issues of symptom management in the setting of serious and potentially life-threatening illness.Greater than 50%  of this time was spent counseling and coordinating care related to the above assessment and plan.  Signed by: Darl Pikes, PharmD, BCPS, Salley Slaughter,  CPP Hematology/Oncology Clinical Pharmacist Practitioner [facility] [phone]  08/23/2020 4:54 PM

## 2020-08-24 ENCOUNTER — Encounter: Payer: Self-pay | Admitting: Oncology

## 2020-08-24 NOTE — Progress Notes (Signed)
Hematology/Oncology Consult note [facility]  [phone] [phone]  Patient Care Team: [name], MD as PCP - General (Family Medicine) Clent Jacks, RN as Oncology Nurse Navigator [name], MD as Consulting Physician (Colon and Rectal Surgery) Lin Landsman, MD as Consulting Physician (Gastroenterology)   Name of the patient: [name] [name]  [id number]  1930/01/24   Date of visit: 08/24/20  Diagnosis-stage IIIc invasive adenocarcinoma of the ascending colon pT4 apN2 acM0 s/p hemicolectomy  Chief complaint/ Reason for visit-discuss final pathology results and further management  Heme/Onc history: Patient is a 87 year old female who was referred for iron deficiency anemia and received IV iron.She also underwent EGD and colonoscopy with Dr. Marius Ditch.  EGD was unremarkable but colonoscopy showed a partially obstructing mass at the ileocecal valve with oozing present.  This was biopsied and consistent with adenocarcinoma.  She had other polyps noted in the colon and rectum as well which were biopsied and was negative for dysplasia or malignancy.  Patient underwent hemicolectomy on 08/04/2020.  Final pathology showed 5 cm moderately differentiated adenocarcinoma with negative margins.  4 out of 21 lymph nodes positive for malignancy.  Tumor extension invades visceral peritoneum.  No lymphovascular or perineural invasion.  [id number]  Interval history-patient is recovering well from her surgery.  Bowel movements are regular.  She is not using any pain medications.  She is here with her son today.  Patient is hard of hearing  ECOG PS- 2 Pain scale- 0  Review of systems- Review of Systems  Constitutional:  Negative for chills, fever, malaise/fatigue and weight loss.  HENT:  Negative for congestion, ear discharge and nosebleeds.   Eyes:  Negative for blurred vision.  Respiratory:  Negative for cough, hemoptysis,  sputum production, shortness of breath and wheezing.   Cardiovascular:  Negative for chest pain, palpitations, orthopnea and claudication.  Gastrointestinal:  Negative for abdominal pain, blood in stool, constipation, diarrhea, heartburn, melena, nausea and vomiting.  Genitourinary:  Negative for dysuria, flank pain, frequency, hematuria and urgency.  Musculoskeletal:  Negative for back pain, joint pain and myalgias.  Skin:  Negative for rash.  Neurological:  Negative for dizziness, tingling, focal weakness, seizures, weakness and headaches.  Endo/Heme/Allergies:  Does not bruise/bleed easily.  Psychiatric/Behavioral:  Negative for depression and suicidal ideas. The patient does not have insomnia.       Allergies  Allergen Reactions   Metoprolol Swelling    Feet, ankles and legs    Bactrim [Sulfamethoxazole-Trimethoprim] Other (See Comments)    Hearing loss   Ciprofloxacin Other (See Comments)    Flu-like symptoms   Pneumococcal Vaccines Other (See Comments)    Flu-like symptoms     Past Medical History:  Diagnosis Date   Anemia    Anxiety    Cancer (HCC)    Squamous Cell Carcinoma (top of head)   Colon cancer (HCC)    Constipation, chronic    Deaf, left    Diverticular disease    GERD (gastroesophageal reflux disease)    Hearing aid worn    Hearing deficit    History of Bell's palsy    left side - resolved - 50 yrs ago   Hypertension    Internal hemorrhoids    Iron deficiency anemia    Mitral regurgitation    moderate to severe on 03/27/20 echo   Osteopenia    Pure hypercholesterolemia      Past Surgical History:  Procedure Laterality Date   CATARACT EXTRACTION  W/PHACO Left 11/16/2015   Procedure: CATARACT EXTRACTION PHACO AND INTRAOCULAR LENS PLACEMENT (IOC);  Surgeon: Leandrew Koyanagi, MD;  Location: [place];  Service: Ophthalmology;  Laterality: Left;  TORIC   CATARACT EXTRACTION W/PHACO Right 12/07/2015   Procedure: CATARACT EXTRACTION PHACO  AND INTRAOCULAR LENS PLACEMENT (IOC);  Surgeon: Leandrew Koyanagi, MD;  Location: [place];  Service: Ophthalmology;  Laterality: Right;  TORIC   COLONOSCOPY WITH PROPOFOL N/A 08/10/2014   Procedure: COLONOSCOPY WITH PROPOFOL;  Surgeon: Lollie Sails, MD;  Location: [facility] ENDOSCOPY;  Service: Endoscopy;  Laterality: N/A;   COLONOSCOPY WITH PROPOFOL N/A 05/12/2020   Procedure: COLONOSCOPY WITH PROPOFOL;  Surgeon: Lin Landsman, MD;  Location: [facility] ENDOSCOPY;  Service: Gastroenterology;  Laterality: N/A;   ESOPHAGOGASTRODUODENOSCOPY (EGD) WITH PROPOFOL N/A 03/28/2020   Procedure: ESOPHAGOGASTRODUODENOSCOPY (EGD) WITH PROPOFOL;  Surgeon: Lucilla Lame, MD;  Location: [facility] ENDOSCOPY;  Service: Endoscopy;  Laterality: N/A;   EYE SURGERY     FEMUR FRACTURE SURGERY     FRACTURE SURGERY     GIVENS CAPSULE STUDY N/A 04/19/2020   Procedure: GIVENS CAPSULE STUDY;  Surgeon: Lin Landsman, MD;  Location: [facility] ENDOSCOPY;  Service: Gastroenterology;  Laterality: N/A;   LAPAROSCOPIC RIGHT HEMI COLECTOMY Right 08/04/2020   Procedure: LAPAROSCOPIC RIGHT HEMI COLECTOMY;  Surgeon: [name], MD;  Location: [facility];  Service: General;  Laterality: Right;   Nasal Polyp Removal      Social History   Socioeconomic History   Marital status: Widowed    Spouse name: Not on file   Number of children: Not on file   Years of education: Not on file   Highest education level: Not on file  Occupational History   Not on file  Tobacco Use   Smoking status: Former    Types: Cigarettes    Quit date: 10/30/1985    Years since quitting: 34.8   Smokeless tobacco: Never  Vaping Use   Vaping Use: Never used  Substance and Sexual Activity   Alcohol use: No   Drug use: No   Sexual activity: Not Currently  Other Topics Concern   Not on file  Social History Narrative   Not on file   Social Determinants of Health   Financial Resource Strain: Not on file  Food Insecurity: Not on file   Transportation Needs: Not on file  Physical Activity: Not on file  Stress: Not on file  Social Connections: Not on file  Intimate Partner Violence: Not on file    Family History  Problem Relation Age of Onset   Colon cancer Mother      Current Outpatient Medications:    acetaminophen (TYLENOL) 500 MG tablet, Take 1 tablet (500 mg total) by mouth every 6 (six) hours as needed. (Patient taking differently: Take 1,000 mg by mouth every 6 (six) hours as needed for moderate pain.), Disp: 30 tablet, Rfl: 0   calcium carbonate (TUMS - DOSED IN MG ELEMENTAL CALCIUM) 500 MG chewable tablet, Chew 500 mg by mouth daily as needed for indigestion or heartburn., Disp: , Rfl:    fluticasone (FLONASE) 50 MCG/ACT nasal spray, Place 1 spray into both nostrils daily., Disp: , Rfl:    furosemide (LASIX) 40 MG tablet, Take 1 tablet (40 mg total) by mouth daily., Disp: 30 tablet, Rfl: 1   iron polysaccharides (NIFEREX) 150 MG capsule, Take 1 capsule (150 mg total) by mouth daily., Disp: 30 capsule, Rfl: 0   losartan (COZAAR) 25 MG tablet, Take 1 tablet (25 mg  total) by mouth daily., Disp: 30 tablet, Rfl: 0   lovastatin (MEVACOR) 40 MG tablet, Take 40 mg by mouth every evening., Disp: , Rfl:    Polyethyl Glycol-Propyl Glycol (SYSTANE OP), Place 1 drop into both eyes in the morning and at bedtime., Disp: , Rfl:    polyethylene glycol powder (GLYCOLAX/MIRALAX) 17 GM/SCOOP powder, Take 17 g by mouth 2 (two) times daily as needed for moderate constipation., Disp: , Rfl:    tretinoin (RETIN-A) 0.05 % cream, Apply 1 application topically at bedtime., Disp: , Rfl:    amiodarone (PACERONE) 200 MG tablet, Take 1 tablet (200 mg total) by mouth 2 (two) times daily for 14 days. (Patient not taking: Reported on 08/23/2020), Disp: 28 tablet, Rfl: 0   [START ON 08/28/2020] amiodarone (PACERONE) 200 MG tablet, Take 1 tablet (200 mg total) by mouth daily. (Patient not taking: Reported on 08/23/2020), Disp: 30 tablet, Rfl: 2    bisoprolol (ZEBETA) 5 MG tablet, Take 1 tablet (5 mg total) by mouth daily. (Patient not taking: Reported on 08/23/2020), Disp: 30 tablet, Rfl: 0   melatonin 3 MG TABS tablet, Take 1 tablet (3 mg total) by mouth at bedtime. (Patient not taking: Reported on 08/23/2020), Disp: , Rfl: 0   pantoprazole (PROTONIX) 40 MG tablet, Take 1 tablet (40 mg total) by mouth 2 (two) times daily before a meal. (Patient taking differently: Take 40 mg by mouth 2 (two) times daily as needed (acid reflux).), Disp: 60 tablet, Rfl: 2   potassium chloride (KLOR-CON) 20 MEQ packet, Take 20 mEq by mouth daily., Disp: 30 packet, Rfl: 0   Rivaroxaban (XARELTO) 15 MG TABS tablet, Take 1 tablet (15 mg total) by mouth daily with supper. (Patient not taking: Reported on 08/23/2020), Disp: 42 tablet, Rfl:   Physical exam:  Vitals:   08/23/20 1435 08/23/20 1443  BP:  119/60  Pulse:  (!) 49  Temp: 97.8 F (36.6 C)   TempSrc: Oral   Weight: 111 lb (50.3 kg)   Height: _0  (1.6 m)    Physical Exam Constitutional:      Comments: Thin elderly female who is heard of hearing  Cardiovascular:     Rate and Rhythm: Normal rate and regular rhythm.     Heart sounds: Normal heart sounds.  Pulmonary:     Effort: Pulmonary effort is normal.     Breath sounds: Normal breath sounds.  Abdominal:     Comments: Laparoscopy scar is healing well.  Skin:    General: Skin is warm and dry.  Neurological:     Mental Status: She is alert and oriented to person, place, and time.     CMP Latest Ref Rng & Units 08/15/2020  Glucose 70 - 99 mg/dL 115(H)  BUN 8 - 23 mg/dL 40(H)  Creatinine 0.44 - 1.00 mg/dL 0.99  Sodium 135 - 145 mmol/L 136  Potassium 3.5 - 5.1 mmol/L 4.1  Chloride 98 - 111 mmol/L 102  CO2 22 - 32 mmol/L 24  Calcium 8.9 - 10.3 mg/dL 9.2  Total Protein 6.5 - 8.1 g/dL -  Total Bilirubin 0.3 - 1.2 mg/dL -  Alkaline Phos 38 - 126 U/L -  AST 15 - 41 U/L -  ALT 0 - 44 U/L -   CBC Latest Ref Rng & Units 08/15/2020  WBC 4.0 -  10.5 K/uL 13.7(H)  Hemoglobin 12.0 - 15.0 g/dL 10.3(L)  Hematocrit 36.0 - 46.0 % 34.0(L)  Platelets 150 - 400 K/uL 582(H)    No images  are attached to the encounter.  DG CHEST PORT 1 VIEW  Result Date: 08/07/2020 CLINICAL DATA:  Shortness of breath EXAM: PORTABLE CHEST 1 VIEW COMPARISON:  03/31/2020 FINDINGS: Cardiac shadow is within normal limits. Aortic calcifications are seen. Increased vascular congestion is noted with bilateral pleural effusions left greater than right and mild interstitial edema consistent with CHF. No bony abnormality is noted. IMPRESSION: Changes of CHF with bilateral effusions and pulmonary edema. Electronically Signed   By: Inez Catalina M.D.   On: 08/07/2020 00:22   ECHOCARDIOGRAM COMPLETE  Result Date: 08/08/2020    ECHOCARDIOGRAM REPORT   Patient Name:   [name] [name] Date of Exam: 08/08/2020 Medical Rec #:  [id number]           Height:       63.0 in Accession #:    [id number]          Weight:       113.8 lb Date of Birth:  [birth year]           BSA:          1.521 m Patient Age:    87 years            BP:           159/130 mmHg Patient Gender: F                   HR:           119 bpm. Exam Location:  Inpatient Procedure: 2D Echo, Color Doppler and Cardiac Doppler Indications:    CHF-Acute Diastolic N17.00  History:        Patient has prior history of Echocardiogram examinations, most                 recent 03/27/2020. Risk Factors:Hypertension.  Sonographer:    Bernadene Person RDCS Referring Phys: [id number] [place]  1. Patient is in Afib with RVR during study.  2. Left ventricular ejection fraction, by estimation, is 30 to 35%. The left ventricle has moderately decreased function. There is global hypokinesis that appears slightly worse in the basal-to-apical inferoseptal and basal-to-mid anteroseptal segments.  There is mild asymmetric left ventricular hypertrophy of the basal-septal segment. Diastolic function is indeterminant due to atrial fibrillation.   3. Right ventricular systolic function is moderately reduced. The right ventricular size is normal. There is normal pulmonary artery systolic pressure. The estimated right ventricular systolic pressure is 67.5 mmHg.  4. Left atrial size was severely dilated.  5. Right atrial size was mildly dilated.  6. Moderate pleural effusion in the left lateral region.  7. The mitral valve is myxomatous. Moderate mitral valve regurgitation.  8. The aortic valve is tricuspid. There is mild calcification of the aortic valve. There is mild thickening of the aortic valve. Aortic valve regurgitation is trivial. Mild aortic valve sclerosis is present, with no evidence of aortic valve stenosis.  9. The inferior vena cava is normal in size with greater than 50% respiratory variability, suggesting right atrial pressure of 3 mmHg. Comparison(s): Compared to prior TTE in 03/27/2020, the LVEF has dropped to 30-35% (previously 60-65%) in the setting of Afib with RVR. The mitral valve thickening is better appreciated on prior study. There continues to be moderate MR and a pleural effusion. FINDINGS  Left Ventricle: Left ventricular ejection fraction, by estimation, is 30 to 35%. The left ventricle has moderately decreased function. There is global hypokinesis that appears slightly worse in the basal-to-apical inferoseptal and  basal-to-mid anteroseptal segments. The left ventricular internal cavity size was normal in size. There is mild asymmetric left ventricular hypertrophy of the basal-septal segment. Diastolic function is indeterminant due to atrial fibrillation. Right Ventricle: The right ventricular size is normal. Right vetricular wall thickness was not well visualized. Right ventricular systolic function is moderately reduced. There is normal pulmonary artery systolic pressure. The tricuspid regurgitant velocity is 2.35 m/s, and with an assumed right atrial pressure of 3 mmHg, the estimated right ventricular systolic pressure is 25.4  mmHg. Left Atrium: Left atrial size was severely dilated. Right Atrium: Right atrial size was mildly dilated. Pericardium: There is no evidence of pericardial effusion. Mitral Valve: The mitral valve is myxomatous. Mild to moderate mitral annular calcification. Moderate mitral valve regurgitation. Tricuspid Valve: The tricuspid valve is normal in structure. Tricuspid valve regurgitation is trivial. Aortic Valve: The aortic valve is tricuspid. There is mild calcification of the aortic valve. There is mild thickening of the aortic valve. Aortic valve regurgitation is trivial. Aortic regurgitation PHT measures 639 msec. Mild aortic valve sclerosis is present, with no evidence of aortic valve stenosis. Pulmonic Valve: The pulmonic valve was normal in structure. Pulmonic valve regurgitation is trivial. Aorta: The aortic root and ascending aorta are structurally normal, with no evidence of dilitation. Venous: The inferior vena cava is normal in size with greater than 50% respiratory variability, suggesting right atrial pressure of 3 mmHg. IAS/Shunts: No atrial level shunt detected by color flow Doppler. Additional Comments: There is a moderate pleural effusion in the left lateral region.  LEFT VENTRICLE PLAX 2D LVIDd:         4.20 cm LVIDs:         3.20 cm LV PW:         1.00 cm LV IVS:        0.80 cm LVOT diam:     1.90 cm LV SV:         27 LV SV Index:   18 LVOT Area:     2.84 cm  LV Volumes (MOD) LV vol d, MOD A2C: 81.0 ml LV vol d, MOD A4C: 66.6 ml LV vol s, MOD A2C: 44.0 ml LV vol s, MOD A4C: 34.2 ml LV SV MOD A2C:     37.0 ml LV SV MOD A4C:     66.6 ml LV SV MOD BP:      35.1 ml RIGHT VENTRICLE TAPSE (M-mode): 1.3 cm LEFT ATRIUM           Index       RIGHT ATRIUM           Index LA diam:      4.20 cm 2.76 cm/m  RA Area:     12.70 cm LA Vol (A2C): 50.6 ml 33.26 ml/m RA Volume:   32.40 ml  21.30 ml/m LA Vol (A4C): 53.4 ml 35.10 ml/m  AORTIC VALVE LVOT Vmax:   70.53 cm/s LVOT Vmean:  50.300 cm/s LVOT VTI:     0.096 m AI PHT:      639 msec  AORTA Ao Root diam: 2.90 cm Ao Asc diam:  3.00 cm MR Peak grad:    102.4 mmHg  TRICUSPID VALVE MR Mean grad:    66.0 mmHg   TR Peak grad:   22.1 mmHg MR Vmax:         506.00 cm/s TR Vmax:        235.00 cm/s MR Vmean:        384.0 cm/s MR PISA:  0.57 cm    SHUNTS MR PISA Eff ROA: 4 mm       Systemic VTI:  0.10 m MR PISA Radius:  0.30 cm     Systemic Diam: 1.90 cm Gwyndolyn Kaufman MD Electronically signed by Gwyndolyn Kaufman MD Signature Date/Time: 08/08/2020/11:52:53 AM    Final      Assessment and plan- Patient is a 87 y.o. female with history of adenocarcinoma of the ascending colon stage IIIc pT4 apN2 acM0 s/p hemicolectomy here to discuss further management  Discussed the results of final pathology with the patient and her son in detail which shows a stage IIIc adenocarcinoma 5 cm extending into visceral peritoneum.  Margins were negative and no LVI or PNI.  4 out of 21 lymph nodes positive for malignancy.  Ideally in stage III colon cancer patient is to benefit from adjuvant chemotherapy based on improvement in disease-free survival.  However patient is 87 years of age and the absolute benefit of chemotherapy will not likely be as much as compared to someone much younger.  Despite patient's age she is independent of her ADLs and has a good performance status.  We therefore discussed the following adjuvant strategies moving forward  Infusional 5-FU chemotherapy alone without oxaliplatin given IV every 2 weeks up to 6 months if tolerated.  Discussed risks and benefits of chemotherapy including all but not limited to nausea, vomiting, low blood counts, risk of infections and hospitalization.  Need for port placement as well as carrying an infusion pump for 2 days Oral Xeloda given at 1000 mg per metered square twice daily 2 weeks on 1 week of every 3 weeks after 8 cycles if tolerated.  Discussed risks and benefits of Xeloda including all but not limited to nausea,  vomiting, diarrhea, skin rash, low blood counts, risk of infections and hospitalization No adjuvant chemotherapy and watch with surveillance alone.  Patient's son is more inclined for the patient to try oral Xeloda but patient is not entirely sure as to what she wants to do.  I have asked them to go home and think about these options.  They also met with our pharmacist Kellie Simmering who went over the risks and benefits of Xeloda and gave him written information about Xeloda as well.   We will start working on insurance approval for Xeloda and once the patient has made her decision we will decide about her future follow-up.  Regardless from colon cancer surveillance standpoint she would benefit from CT scans every 4 to 6 months.  Her baseline CEA has been normal.  Treatment will be given with a curative intent  Cancer Staging Cancer of ascending colon s/p lap colectomy 08/04/2020 Staging form: Colon and Rectum, AJCC 8th Edition - Pathologic stage from 08/24/2020: Stage IIIC (pT4a, pN2a, cM0) - Signed by Sindy Guadeloupe, MD on 08/24/2020 Histologic grading system: 4 grade system Histologic grade (G): G2 Residual tumor (R): R0 - None    Visit Diagnosis 1. Goals of care, counseling/discussion   2. Cancer of ascending colon s/p lap colectomy 08/04/2020      Dr. Randa Evens, MD, MPH [facility] [phone] 08/24/2020 8:45 AM

## 2020-08-29 ENCOUNTER — Encounter: Payer: Self-pay | Admitting: Oncology

## 2020-08-29 NOTE — Telephone Encounter (Signed)
Oral Oncology Patient Advocate Encounter  After completing a benefits investigation, prior authorization for Xeloda is not required at this time through Medicare Part B.  Patient's copay is $0.00.  [place] [facility] [place] Phone [phone] Fax [phone] 08/29/2020 10:56 AM

## 2020-09-05 ENCOUNTER — Inpatient Hospital Stay: Payer: Medicare Other | Admitting: Hematology

## 2020-09-05 DIAGNOSIS — C182 Malignant neoplasm of ascending colon: Secondary | ICD-10-CM

## 2020-09-05 NOTE — Progress Notes (Signed)
I left a vm for [name] [name] [name], [name] [name] asking him to phone me.  [name] [name] saw [name] [name] on 08/24/2020, I am verifying their purpose for the appt with [name] [name] today. 1330 I spoke with [name] [name].  He stated his mother will continue to received her oncology care at [facility].  I have cancelled her new pt appt with [name] [name].

## 2020-09-06 ENCOUNTER — Telehealth: Payer: Self-pay | Admitting: Pharmacist

## 2020-09-06 ENCOUNTER — Telehealth: Payer: Self-pay | Admitting: Oncology

## 2020-09-06 NOTE — Telephone Encounter (Signed)
Spoke with patient about CT scan and lab/MD follow-up schedule in November. Mailing updated AVS as well.

## 2020-09-06 NOTE — Telephone Encounter (Signed)
Oral Chemotherapy Pharmacist Encounter   I called [name] [name] to see if she made a decision on whether to proceed with adjuvant capecitabine treatment. She stated that she does not want to proceed with adjuvant capecitabine, citing her age and the potential side effects as her reasons.   Dr. Janese Banks was notified of the patient decision.  Darl Pikes, PharmD, BCPS, BCOP, CPP Hematology/Oncology Clinical Pharmacist [facility] [phone]  09/06/2020 12:20 PM

## 2020-09-09 ENCOUNTER — Inpatient Hospital Stay
Admission: EM | Admit: 2020-09-09 | Discharge: 2020-09-12 | DRG: 291 | Disposition: A | Discharge status: Home Health | Payer: Medicare Other | Attending: Internal Medicine | Admitting: Internal Medicine

## 2020-09-09 ENCOUNTER — Other Ambulatory Visit: Payer: Self-pay

## 2020-09-09 ENCOUNTER — Emergency Department: Payer: Medicare Other

## 2020-09-09 DIAGNOSIS — I480 Paroxysmal atrial fibrillation: Secondary | ICD-10-CM | POA: Diagnosis present

## 2020-09-09 DIAGNOSIS — R0602 Shortness of breath: Secondary | ICD-10-CM

## 2020-09-09 DIAGNOSIS — K219 Gastro-esophageal reflux disease without esophagitis: Secondary | ICD-10-CM | POA: Diagnosis present

## 2020-09-09 DIAGNOSIS — R0600 Dyspnea, unspecified: Secondary | ICD-10-CM

## 2020-09-09 DIAGNOSIS — Z800 Family history of malignant neoplasm of digestive organs: Secondary | ICD-10-CM

## 2020-09-09 DIAGNOSIS — N390 Urinary tract infection, site not specified: Secondary | ICD-10-CM | POA: Diagnosis present

## 2020-09-09 DIAGNOSIS — I160 Hypertensive urgency: Secondary | ICD-10-CM | POA: Diagnosis present

## 2020-09-09 DIAGNOSIS — Z7982 Long term (current) use of aspirin: Secondary | ICD-10-CM

## 2020-09-09 DIAGNOSIS — F419 Anxiety disorder, unspecified: Secondary | ICD-10-CM | POA: Diagnosis present

## 2020-09-09 DIAGNOSIS — Z20822 Contact with and (suspected) exposure to covid-19: Secondary | ICD-10-CM | POA: Diagnosis present

## 2020-09-09 DIAGNOSIS — Z85038 Personal history of other malignant neoplasm of large intestine: Secondary | ICD-10-CM

## 2020-09-09 DIAGNOSIS — Z9889 Other specified postprocedural states: Secondary | ICD-10-CM

## 2020-09-09 DIAGNOSIS — E7800 Pure hypercholesterolemia, unspecified: Secondary | ICD-10-CM | POA: Diagnosis present

## 2020-09-09 DIAGNOSIS — R3915 Urgency of urination: Secondary | ICD-10-CM | POA: Diagnosis present

## 2020-09-09 DIAGNOSIS — J9601 Acute respiratory failure with hypoxia: Secondary | ICD-10-CM | POA: Diagnosis present

## 2020-09-09 DIAGNOSIS — I110 Hypertensive heart disease with heart failure: Principal | ICD-10-CM | POA: Diagnosis present

## 2020-09-09 DIAGNOSIS — B961 Klebsiella pneumoniae [K. pneumoniae] as the cause of diseases classified elsewhere: Secondary | ICD-10-CM | POA: Diagnosis present

## 2020-09-09 DIAGNOSIS — I428 Other cardiomyopathies: Secondary | ICD-10-CM | POA: Diagnosis present

## 2020-09-09 DIAGNOSIS — R350 Frequency of micturition: Secondary | ICD-10-CM | POA: Diagnosis present

## 2020-09-09 DIAGNOSIS — R531 Weakness: Secondary | ICD-10-CM

## 2020-09-09 DIAGNOSIS — I5023 Acute on chronic systolic (congestive) heart failure: Secondary | ICD-10-CM | POA: Diagnosis present

## 2020-09-09 DIAGNOSIS — Z87891 Personal history of nicotine dependence: Secondary | ICD-10-CM

## 2020-09-09 DIAGNOSIS — J810 Acute pulmonary edema: Secondary | ICD-10-CM

## 2020-09-09 DIAGNOSIS — J90 Pleural effusion, not elsewhere classified: Secondary | ICD-10-CM

## 2020-09-09 DIAGNOSIS — J918 Pleural effusion in other conditions classified elsewhere: Secondary | ICD-10-CM | POA: Diagnosis present

## 2020-09-09 DIAGNOSIS — Z7901 Long term (current) use of anticoagulants: Secondary | ICD-10-CM | POA: Diagnosis not present

## 2020-09-09 DIAGNOSIS — I509 Heart failure, unspecified: Secondary | ICD-10-CM

## 2020-09-09 DIAGNOSIS — E876 Hypokalemia: Secondary | ICD-10-CM | POA: Diagnosis present

## 2020-09-09 DIAGNOSIS — I5021 Acute systolic (congestive) heart failure: Secondary | ICD-10-CM | POA: Diagnosis not present

## 2020-09-09 LAB — URINALYSIS, COMPLETE (UACMP) WITH MICROSCOPIC
Bilirubin Urine: NEGATIVE
Glucose, UA: NEGATIVE mg/dL
Hgb urine dipstick: NEGATIVE
Ketones, ur: NEGATIVE mg/dL
Nitrite: NEGATIVE
Protein, ur: 30 mg/dL — AB
Specific Gravity, Urine: 1.011 (ref 1.005–1.030)
pH: 7 (ref 5.0–8.0)

## 2020-09-09 LAB — BASIC METABOLIC PANEL
Anion gap: 15 (ref 5–15)
BUN: 14 mg/dL (ref 8–23)
CO2: 26 mmol/L (ref 22–32)
Calcium: 9 mg/dL (ref 8.9–10.3)
Chloride: 100 mmol/L (ref 98–111)
Creatinine, Ser: 0.7 mg/dL (ref 0.44–1.00)
GFR, Estimated: >60 mL/min (ref >60)
Glucose, Bld: 73 mg/dL (ref 70–99)
Potassium: 2.6 mmol/L — CL (ref 3.5–5.1)
Sodium: 141 mmol/L (ref 135–145)

## 2020-09-09 LAB — PROTIME-INR
INR: 1 (ref 0.8–1.2)
Prothrombin Time: 13.5 seconds (ref 11.4–15.2)

## 2020-09-09 LAB — MAGNESIUM: Magnesium: 2.3 mg/dL (ref 1.7–2.4)

## 2020-09-09 LAB — TROPONIN I (HIGH SENSITIVITY): Troponin I (High Sensitivity): 9 ng/L (ref <18)

## 2020-09-09 MED ORDER — POTASSIUM CHLORIDE 20 MEQ PO PACK
40.0000 meq | PACK | Freq: Once | ORAL | Status: AC
  Administered 2020-09-10: 40 meq via ORAL
  Filled 2020-09-09: qty 2

## 2020-09-09 MED ORDER — FUROSEMIDE 10 MG/ML IJ SOLN
40.0000 mg | Freq: Two times a day (BID) | INTRAMUSCULAR | Status: DC
  Administered 2020-09-10 – 2020-09-12 (×5): 40 mg via INTRAVENOUS
  Filled 2020-09-09 (×5): qty 4

## 2020-09-09 MED ORDER — ENOXAPARIN SODIUM 40 MG/0.4ML IJ SOSY
40.0000 mg | PREFILLED_SYRINGE | INTRAMUSCULAR | Status: DC
  Administered 2020-09-09 – 2020-09-11 (×3): 40 mg via SUBCUTANEOUS
  Filled 2020-09-09 (×3): qty 0.4

## 2020-09-09 MED ORDER — POTASSIUM CHLORIDE 10 MEQ/100ML IV SOLN
10.0000 meq | Freq: Once | INTRAVENOUS | Status: AC
  Administered 2020-09-09: 10 meq via INTRAVENOUS
  Filled 2020-09-09: qty 100

## 2020-09-09 MED ORDER — SODIUM CHLORIDE 0.9 % IV SOLN
1.0000 g | Freq: Once | INTRAVENOUS | Status: AC
  Administered 2020-09-09: 1 g via INTRAVENOUS
  Filled 2020-09-09: qty 10

## 2020-09-09 MED ORDER — FUROSEMIDE 10 MG/ML IJ SOLN
60.0000 mg | Freq: Once | INTRAMUSCULAR | Status: AC
  Administered 2020-09-09: 60 mg via INTRAVENOUS
  Filled 2020-09-09: qty 8

## 2020-09-09 MED ORDER — SODIUM CHLORIDE 0.9 % IV SOLN
1.0000 g | INTRAVENOUS | Status: DC
  Administered 2020-09-10 – 2020-09-11 (×2): 1 g via INTRAVENOUS
  Filled 2020-09-09: qty 10
  Filled 2020-09-09: qty 1
  Filled 2020-09-09: qty 10

## 2020-09-09 MED ORDER — ONDANSETRON HCL 4 MG PO TABS: 4.0000 mg | ORAL_TABLET | Freq: Four times a day (QID) | ORAL | Status: DC | PRN

## 2020-09-09 MED ORDER — TRAZODONE HCL 50 MG PO TABS: 25.0000 mg | ORAL_TABLET | Freq: Every evening | ORAL | Status: DC | PRN

## 2020-09-09 MED ORDER — POTASSIUM CHLORIDE 20 MEQ PO PACK
40.0000 meq | PACK | Freq: Once | ORAL | Status: AC
  Administered 2020-09-09: 40 meq via ORAL
  Filled 2020-09-09: qty 2

## 2020-09-09 MED ORDER — ACETAMINOPHEN 650 MG RE SUPP: 650.0000 mg | Freq: Four times a day (QID) | RECTAL | Status: DC | PRN

## 2020-09-09 MED ORDER — SODIUM CHLORIDE 0.9 % IV BOLUS
1000.0000 mL | Freq: Once | INTRAVENOUS | Status: AC
  Administered 2020-09-09: 1000 mL via INTRAVENOUS

## 2020-09-09 MED ORDER — HYDRALAZINE HCL 20 MG/ML IJ SOLN
10.0000 mg | Freq: Four times a day (QID) | INTRAMUSCULAR | Status: DC | PRN
  Administered 2020-09-10: 10 mg via INTRAVENOUS
  Filled 2020-09-09: qty 1

## 2020-09-09 MED ORDER — ACETAMINOPHEN 325 MG PO TABS: 650.0000 mg | ORAL_TABLET | Freq: Four times a day (QID) | ORAL | Status: DC | PRN

## 2020-09-09 MED ORDER — ONDANSETRON HCL 4 MG/2ML IJ SOLN: 4.0000 mg | Freq: Four times a day (QID) | INTRAMUSCULAR | Status: DC | PRN

## 2020-09-09 MED ORDER — MAGNESIUM HYDROXIDE 400 MG/5ML PO SUSP: 30.0000 mL | Freq: Every day | ORAL | Status: DC | PRN

## 2020-09-09 NOTE — ED Notes (Signed)
MD at bedside. 

## 2020-09-09 NOTE — ED Notes (Signed)
Pt taken to room 6 and placed on 5 lead cardiac monitor, pulse ox and BP cuff. Visitor at bedside.

## 2020-09-09 NOTE — ED Triage Notes (Signed)
Patient presents to the [facility] with son. Patient arrives from [facility] with weakness and SOB. Son reports they live at home together, and he cares for her. Patient reports generalized weakness. "I feel like I'm dying, I'm just exhausted." Patient denies chest pain, or any other complaints at this time.

## 2020-09-09 NOTE — H&P (Addendum)
[facility]   PATIENT NAME: [name] [name]    MR#:  [id number]  DATE OF BIRTH:  11-17-1929  DATE OF ADMISSION:  09/09/2020  PRIMARY CARE PHYSICIAN: [name], MD   Patient is coming from: Home  REQUESTING/REFERRING PHYSICIAN: Harvest Dark, MD  CHIEF COMPLAINT:   Chief Complaint  Patient presents with   Weakness  Shortness of breath  HISTORY OF PRESENT ILLNESS:  [name] [name] is a 87 y.o. Caucasian female with medical history significant for anxiety, GERD, hypertension and dyslipidemia, presented to the emergency room with acute onset of generalized weakness and dyspnea which have been going on over the last week with associated dry cough and wheezing.  She admitted to orthopnea and paroxysmal nocturnal dyspnea as well as dyspnea on minimal exertion.  She woke up this morning gasping for air.  She admits to urinary frequency and urgency this morning.  No chest pain or palpitations.  No dysuria or hematuria or flank pain.  The patient has been concerned about recurrent pleural effusion which she was had thoracentesis for in the past.  ED Course: Upon presentation to the ER blood pressure was 169/66 with a heart rate of 58 approximately 93% on room air.  Blood pressure has been as high as 177/117.  Labs revealed hypokalemia kalemia 2.6.  CBC is currently pending.  INR was 1 and PT 13.5.  Influenza antigens and COVID 19 PCR came back negative.  UA showed many bacteria with 0-5 WBCs.  Urine culture was sent. EKG as reviewed by me : Showed normal sinus rhythm with a rate of 64 with poor R wave progression Imaging: Portable chest ray showed cardiomegaly with vascular congestion and pulmonary edema with moderate to large left and small moderate right pleural effusion with basilar airspace disease.  The patient was given 60 mg of IV Lasix, 10 mg peripheral of IV potassium chloride and 1 g of IV Rocephin.  She will be admitted to the progressive unit bed for further  evaluation and management. PAST MEDICAL HISTORY:   Past Medical History:  Diagnosis Date   Anemia    Anxiety    Cancer ([place])    Squamous Cell Carcinoma (top of head)   Colon cancer ([place])    Constipation, chronic    Deaf, left    Diverticular disease    GERD (gastroesophageal reflux disease)    Hearing aid worn    Hearing deficit    History of Bell's palsy    left side - resolved - 50 yrs ago   Hypertension    Internal hemorrhoids    Iron deficiency anemia    Mitral regurgitation    moderate to severe on 03/27/20 echo   Osteopenia    Pure hypercholesterolemia     PAST SURGICAL HISTORY:   Past Surgical History:  Procedure Laterality Date   CATARACT EXTRACTION W/PHACO Left 11/16/2015   Procedure: CATARACT EXTRACTION PHACO AND INTRAOCULAR LENS PLACEMENT ([place]);  Surgeon: Leandrew Koyanagi, MD;  Location: [facility];  Service: Ophthalmology;  Laterality: Left;  TORIC   CATARACT EXTRACTION W/PHACO Right 12/07/2015   Procedure: CATARACT EXTRACTION PHACO AND INTRAOCULAR LENS PLACEMENT ([facility]);  Surgeon: Leandrew Koyanagi, MD;  Location: [place];  Service: Ophthalmology;  Laterality: Right;  TORIC   COLONOSCOPY WITH PROPOFOL N/A 08/10/2014   Procedure: COLONOSCOPY WITH PROPOFOL;  Surgeon: Lollie Sails, MD;  Location: [facility] ENDOSCOPY;  Service: Endoscopy;  Laterality: N/A;   COLONOSCOPY WITH PROPOFOL N/A 05/12/2020   Procedure: COLONOSCOPY  WITH PROPOFOL;  Surgeon: Lin Landsman, MD;  Location: [facility] ENDOSCOPY;  Service: Gastroenterology;  Laterality: N/A;   ESOPHAGOGASTRODUODENOSCOPY (EGD) WITH PROPOFOL N/A 03/28/2020   Procedure: ESOPHAGOGASTRODUODENOSCOPY (EGD) WITH PROPOFOL;  Surgeon: Lucilla Lame, MD;  Location: [facility] ENDOSCOPY;  Service: Endoscopy;  Laterality: N/A;   EYE SURGERY     FEMUR FRACTURE SURGERY     FRACTURE SURGERY     GIVENS CAPSULE STUDY N/A 04/19/2020   Procedure: GIVENS CAPSULE STUDY;  Surgeon: Lin Landsman, MD;  Location: [facility]  ENDOSCOPY;  Service: Gastroenterology;  Laterality: N/A;   LAPAROSCOPIC RIGHT HEMI COLECTOMY Right 08/04/2020   Procedure: LAPAROSCOPIC RIGHT HEMI COLECTOMY;  Surgeon: [name], MD;  Location: [facility];  Service: General;  Laterality: Right;   Nasal Polyp Removal      SOCIAL HISTORY:   Social History   Tobacco Use   Smoking status: Former    Types: Cigarettes    Quit date: 10/30/1985    Years since quitting: 34.8   Smokeless tobacco: Never  Substance Use Topics   Alcohol use: No    FAMILY HISTORY:   Family History  Problem Relation Age of Onset   Colon cancer Mother     DRUG ALLERGIES:   Allergies  Allergen Reactions   Metoprolol Swelling    Feet, ankles and legs    Bactrim [Sulfamethoxazole-Trimethoprim] Other (See Comments)    Hearing loss   Ciprofloxacin Other (See Comments)    Flu-like symptoms   Pneumococcal Vaccines Other (See Comments)    Flu-like symptoms    REVIEW OF SYSTEMS:   ROS As per history of present illness. All pertinent systems were reviewed above. Constitutional, HEENT, cardiovascular, respiratory, GI, GU, musculoskeletal, neuro, psychiatric, endocrine, integumentary and hematologic systems were reviewed and are otherwise negative/unremarkable except for positive findings mentioned above in the HPI.   MEDICATIONS AT HOME:   Prior to Admission medications   Medication Sig Start Date End Date Taking? Authorizing Provider  acetaminophen (TYLENOL) 500 MG tablet Take 1 tablet (500 mg total) by mouth every 6 (six) hours as needed. Patient taking differently: Take 1,000 mg by mouth every 6 (six) hours as needed for moderate pain. 04/02/20  Yes Enzo Bi, MD  calcium carbonate (TUMS - DOSED IN MG ELEMENTAL CALCIUM) 500 MG chewable tablet Chew 500 mg by mouth daily as needed for indigestion or heartburn.   Yes [provider]  fluticasone (FLONASE) 50 MCG/ACT nasal spray Place 1 spray into both nostrils daily.   Yes [provider]  furosemide (LASIX) 40 MG tablet Take 1 tablet (40 mg total) by mouth daily. 08/14/20  Yes Mercy Riding, MD  iron polysaccharides (NIFEREX) 150 MG capsule Take 1 capsule (150 mg total) by mouth daily. 08/14/20  Yes Mercy Riding, MD  losartan (COZAAR) 25 MG tablet Take 1 tablet (25 mg total) by mouth daily. 08/13/20 09/12/20 Yes Ghimire, Dante Gang, MD  lovastatin (MEVACOR) 40 MG tablet Take 40 mg by mouth every evening.   Yes [provider]  Polyethyl Glycol-Propyl Glycol (SYSTANE OP) Place 1 drop into both eyes in the morning and at bedtime.   Yes [provider]  polyethylene glycol powder (GLYCOLAX/MIRALAX) 17 GM/SCOOP powder Take 17 g by mouth 2 (two) times daily as needed for moderate constipation.   Yes [provider]  potassium chloride (KLOR-CON) 20 MEQ packet Take 20 mEq by mouth daily. 08/13/20 09/12/20 Yes Ghimire, Dante Gang, MD  tretinoin (RETIN-A) 0.05 % cream Apply 1 application topically at bedtime.  07/19/20  Yes [provider]  amiodarone (PACERONE) 200 MG tablet Take 1 tablet (200 mg total) by mouth daily. Patient not taking: No sig reported 08/28/20 11/26/20  Barb Merino, MD  bisoprolol (ZEBETA) 5 MG tablet Take 1 tablet (5 mg total) by mouth daily. Patient not taking: No sig reported 08/13/20 09/12/20  Barb Merino, MD  pantoprazole (PROTONIX) 40 MG tablet Take 1 tablet (40 mg total) by mouth 2 (two) times daily before a meal. Patient taking differently: Take 40 mg by mouth 2 (two) times daily as needed (acid reflux). 04/02/20 08/04/20  Enzo Bi, MD  Rivaroxaban (XARELTO) 15 MG TABS tablet Take 1 tablet (15 mg total) by mouth daily with supper. Patient not taking: No sig reported 08/13/20   Barb Merino, MD      VITAL SIGNS:  Blood pressure (!) 155/112, pulse 62, temperature 98.7 F (37.1 C), temperature source Oral, resp. rate 18, height '5\' 3"'$  (1.6 m), weight 50 kg, SpO2 93 %.  PHYSICAL EXAMINATION:  Physical Exam  GENERAL:  87 y.o.-year-old  patient lying in the bed with minimal respiratory distress with conversational dyspnea. EYES: Pupils equal, round, reactive to light and accommodation. No scleral icterus. Extraocular muscles intact.  HEENT: Head atraumatic, normocephalic. Oropharynx and nasopharynx clear.  NECK:  Supple, no jugular venous distention. No thyroid enlargement, no tenderness.  LUNGS: Diminished bibasilar breath sounds with bibasal rales. CARDIOVASCULAR: Regular rate and rhythm, S1, S2 normal. No murmurs, rubs, or gallops.  ABDOMEN: Soft, nondistended, nontender. Bowel sounds present. No organomegaly or mass.  EXTREMITIES: 1+ bilateral lower extremity pitting edema with no cyanosis, or clubbing.  NEUROLOGIC: Cranial nerves II through XII are intact. Muscle strength 5/5 in all extremities. Sensation intact. Gait not checked.  PSYCHIATRIC: The patient is alert and oriented x 3.  Normal affect and good eye contact. SKIN: No obvious rash, lesion, or ulcer.   LABORATORY PANEL:   CBC No results for input(s): WBC, HGB, HCT, PLT in the last 168 hours. ------------------------------------------------------------------------------------------------------------------  Chemistries  Recent Labs  Lab 09/09/20 1739  NA 141  K 2.6*  CL 100  CO2 26  GLUCOSE 73  BUN 14  CREATININE 0.70  CALCIUM 9.0  MG 2.3   ------------------------------------------------------------------------------------------------------------------  Cardiac Enzymes No results for input(s): TROPONINI in the last 168 hours. ------------------------------------------------------------------------------------------------------------------  RADIOLOGY:  DG Chest Portable 1 View  Result Date: 09/09/2020 CLINICAL DATA:  Weakness EXAM: PORTABLE CHEST 1 VIEW COMPARISON:  08/07/2020, CT 03/28/2020 FINDINGS: Small-moderate right and moderate to large left pleural effusion, increased compared to prior. Cardiomegaly with vascular congestion and edema.  Airspace disease at both bases. IMPRESSION: Cardiomegaly with vascular congestion and pulmonary edema. Moderate to large left and small moderate right pleural effusions with basilar airspace disease. Electronically Signed   By: Donavan Foil M.D.   On: 09/09/2020 21:57      IMPRESSION AND PLAN:  Active Problems:   Acute CHF (congestive heart failure) ([place])  1.  Acute on chronic systolic CHF. - The patient will be admitted to a progressive unit bed. - We will continue diuresis with IV Lasix. - We will follow her troponins. - She had a recent 2D echo revealing an EF of 30 to 35% with severe left atrial dilatation, mild right atrial dilatation and moderate left pleural effusion. - Cardiology consult will be obtained. - I notified Dr. Clayborn Bigness about the patient.  2.  Hypertensive urgency.  This is likely contributing to #1. - The patient will be placed on as needed IV hydralazine. -  We will continue her antihypertensives.  3.  Bilateral pleural effusion, larger on the left than the right. - IR left thoracentesis can be attempted in a.m.  4.  Hypokalemia. - Potassium will be replaced and magnesium level will be checked.  5.  Paroxysmal atrial fibrillation. - We will continue amiodarone and Xarelto.  6.  Dyslipidemia. - We will continue statin therapy.  7.  GERD. - We will continue PPI therapy.  DVT prophylaxis: We will hold Xarelto for thoracentesis. Code Status: full code. Family Communication:  The plan of care was discussed in details with the patient (and family). I answered all questions. The patient agreed to proceed with the above mentioned plan. Further management will depend upon hospital course. Disposition Plan: Back to previous home environment Consults called: Cardiology. All the records are reviewed and case discussed with ED provider.  Status is: Inpatient  Remains inpatient appropriate because:Ongoing diagnostic testing needed not appropriate for outpatient  work up, Unsafe d/c plan, IV treatments appropriate due to intensity of illness or inability to take PO, and Inpatient level of care appropriate due to severity of illness  Dispo: The patient is from: Home              Anticipated d/c is to: Home              Patient currently is not medically stable to d/c.   Difficult to place patient No  TOTAL TIME TAKING CARE OF THIS PATIENT: 55 minutes.    Christel Mormon M.D on 09/09/2020 at 10:50 PM  [facility]   From 7 PM-7 AM, contact night-coverage [email or web address]  CC: Primary care physician; [name], MD

## 2020-09-09 NOTE — ED Provider Notes (Signed)
[facility] Emergency Department Provider Note  Time seen: 10:02 PM  I have reviewed the triage vital signs and the nursing notes.   HISTORY  Chief Complaint Weakness   HPI [name] [name] is a 87 y.o. female with a past medical history of anemia, anxiety, gastric reflux, hypertension, anemia, presents to the emergency department for generalized weakness and shortness of breath.  According to the son over the past week or so the patient has been complaining of generalized weakness and shortness of breath.  No vomiting fever diarrhea.  Patient does have a cough.  States last night she became extremely short of breath when lying down and has noted today shortness of breath with even minimal exertion.  Son states a history of a pleural effusion in the past requiring drainage and they are concerned that she could have fluid accumulating once again.  Patient states cough denies any sputum production.   Past Medical History:  Diagnosis Date   Anemia    Anxiety    Cancer ([place])    Squamous Cell Carcinoma (top of head)   Colon cancer ([place])    Constipation, chronic    Deaf, left    Diverticular disease    GERD (gastroesophageal reflux disease)    Hearing aid worn    Hearing deficit    History of Bell's palsy    left side - resolved - 50 yrs ago   Hypertension    Internal hemorrhoids    Iron deficiency anemia    Mitral regurgitation    moderate to severe on 03/27/20 echo   Osteopenia    Pure hypercholesterolemia     Patient Active Problem List   Diagnosis Date Noted   Pressure injury of skin 08/07/2020   Confusion, postoperative 08/07/2020   Acute pulmonary edema ([place]) 08/07/2020   Diverticular disease 08/07/2020   GERD (gastroesophageal reflux disease) 08/07/2020   History of Bell's palsy 08/07/2020   Mitral regurgitation 08/07/2020   Cancer of ascending colon s/p lap colectomy 08/04/2020 06/01/2020   Colonic mass    Closed intertrochanteric fracture  ([place]) 04/12/2020   Colles' fracture 04/12/2020   Osteoarthritis of knee 04/12/2020   Symptomatic anemia 03/25/2020   HLD (hyperlipidemia) 03/25/2020   Bilateral leg edema 02/04/2020   Rapid atrial fibrillation ([place]) 01/11/2020   Chronic anticoagulation 01/11/2020   Chronic ulcer of left leg ([place]) 01/11/2020   Iron deficiency anemia 01/11/2020   Hypokalemia 01/01/2020   Left leg cellulitis 01/01/2020   Iron deficiency anemia due to chronic blood loss 01/01/2020   Wound cellulitis 12/31/2019   Aortic atherosclerosis ([place]) 09/25/2019   HOH (hard of hearing) 08/31/2019   History of pelvic fracture 08/31/2019   Pelvic fracture ([place]) 08/25/2019   Permanent atrial fibrillation ([place]) 08/25/2019   Pain    Dehydration    Accidental fall 07/26/2019   UTI (urinary tract infection) 07/26/2019   Closed fracture of right inferior pubic ramus ([place]) 07/26/2019   HTN (hypertension) 07/26/2019   Ambulatory dysfunction 07/26/2019   Pubic bone fracture ([place]) 07/26/2019   History of colon polyps 06/25/2017   Constipation, chronic 07/27/2013   Osteopenia 07/27/2013    Past Surgical History:  Procedure Laterality Date   CATARACT EXTRACTION W/PHACO Left 11/16/2015   Procedure: CATARACT EXTRACTION PHACO AND INTRAOCULAR LENS PLACEMENT ([place]);  Surgeon: Leandrew Koyanagi, MD;  Location: [place];  Service: Ophthalmology;  Laterality: Left;  TORIC   CATARACT EXTRACTION W/PHACO Right 12/07/2015   Procedure: CATARACT EXTRACTION PHACO AND INTRAOCULAR LENS PLACEMENT (  [place]);  Surgeon: Leandrew Koyanagi, MD;  Location: [place];  Service: Ophthalmology;  Laterality: Right;  TORIC   COLONOSCOPY WITH PROPOFOL N/A 08/10/2014   Procedure: COLONOSCOPY WITH PROPOFOL;  Surgeon: Lollie Sails, MD;  Location: [facility] ENDOSCOPY;  Service: Endoscopy;  Laterality: N/A;   COLONOSCOPY WITH PROPOFOL N/A 05/12/2020   Procedure: COLONOSCOPY WITH PROPOFOL;  Surgeon: Lin Landsman, MD;  Location: [facility]  ENDOSCOPY;  Service: Gastroenterology;  Laterality: N/A;   ESOPHAGOGASTRODUODENOSCOPY (EGD) WITH PROPOFOL N/A 03/28/2020   Procedure: ESOPHAGOGASTRODUODENOSCOPY (EGD) WITH PROPOFOL;  Surgeon: Lucilla Lame, MD;  Location[facility] ENDOSCOPY;  Service: Endoscopy;  Laterality: N/A;   EYE SURGERY     FEMUR FRACTURE SURGERY     FRACTURE SURGERY     GIVENS CAPSULE STUDY N/A 04/19/2020   Procedure: GIVENS CAPSULE STUDY;  Surgeon: Lin Landsman, MD;  Location: [facility] ENDOSCOPY;  Service: Gastroenterology;  Laterality: N/A;   LAPAROSCOPIC RIGHT HEMI COLECTOMY Right 08/04/2020   Procedure: LAPAROSCOPIC RIGHT HEMI COLECTOMY;  Surgeon: [name], MD;  Location: [facility];  Service: General;  Laterality: Right;   Nasal Polyp Removal      Prior to Admission medications   Medication Sig Start Date End Date Taking? Authorizing Provider  acetaminophen (TYLENOL) 500 MG tablet Take 1 tablet (500 mg total) by mouth every 6 (six) hours as needed. Patient taking differently: Take 1,000 mg by mouth every 6 (six) hours as needed for moderate pain. 04/02/20   Enzo Bi, MD  amiodarone (PACERONE) 200 MG tablet Take 1 tablet (200 mg total) by mouth 2 (two) times daily for 14 days. Patient not taking: Reported on 08/23/2020 08/13/20 08/27/20  Barb Merino, MD  amiodarone (PACERONE) 200 MG tablet Take 1 tablet (200 mg total) by mouth daily. Patient not taking: Reported on 08/23/2020 08/28/20 11/26/20  Barb Merino, MD  bisoprolol (ZEBETA) 5 MG tablet Take 1 tablet (5 mg total) by mouth daily. Patient not taking: Reported on 08/23/2020 08/13/20 09/12/20  Barb Merino, MD  calcium carbonate (TUMS - DOSED IN MG ELEMENTAL CALCIUM) 500 MG chewable tablet Chew 500 mg by mouth daily as needed for indigestion or heartburn.    [provider]  fluticasone (FLONASE) 50 MCG/ACT nasal spray Place 1 spray into both nostrils daily.    [provider]  furosemide (LASIX) 40 MG tablet Take 1 tablet (40 mg total) by  mouth daily. 08/14/20   Mercy Riding, MD  iron polysaccharides (NIFEREX) 150 MG capsule Take 1 capsule (150 mg total) by mouth daily. 08/14/20   Mercy Riding, MD  losartan (COZAAR) 25 MG tablet Take 1 tablet (25 mg total) by mouth daily. 08/13/20 09/12/20  Barb Merino, MD  lovastatin (MEVACOR) 40 MG tablet Take 40 mg by mouth every evening.    [provider]  melatonin 3 MG TABS tablet Take 1 tablet (3 mg total) by mouth at bedtime. Patient not taking: Reported on 08/23/2020 08/13/20   Barb Merino, MD  pantoprazole (PROTONIX) 40 MG tablet Take 1 tablet (40 mg total) by mouth 2 (two) times daily before a meal. Patient taking differently: Take 40 mg by mouth 2 (two) times daily as needed (acid reflux). 04/02/20 08/04/20  Enzo Bi, MD  Polyethyl Glycol-Propyl Glycol (SYSTANE OP) Place 1 drop into both eyes in the morning and at bedtime.    [provider]  polyethylene glycol powder (GLYCOLAX/MIRALAX) 17 GM/SCOOP powder Take 17 g by mouth 2 (two) times daily as needed for moderate constipation.  [provider]  potassium chloride (KLOR-CON) 20 MEQ packet Take 20 mEq by mouth daily. 08/13/20 09/12/20  Barb Merino, MD  Rivaroxaban (XARELTO) 15 MG TABS tablet Take 1 tablet (15 mg total) by mouth daily with supper. Patient not taking: Reported on 08/23/2020 08/13/20   Barb Merino, MD  tretinoin (RETIN-A) 0.05 % cream Apply 1 application topically at bedtime. 07/19/20   [provider]    Allergies  Allergen Reactions   Metoprolol Swelling    Feet, ankles and legs    Bactrim [Sulfamethoxazole-Trimethoprim] Other (See Comments)    Hearing loss   Ciprofloxacin Other (See Comments)    Flu-like symptoms   Pneumococcal Vaccines Other (See Comments)    Flu-like symptoms    Family History  Problem Relation Age of Onset   Colon cancer Mother     Social History Social History   Tobacco Use   Smoking status: Former    Types: Cigarettes    Quit date:  10/30/1985    Years since quitting: 34.8   Smokeless tobacco: Never  Vaping Use   Vaping Use: Never used  Substance Use Topics   Alcohol use: No   Drug use: No    Review of Systems Constitutional: Negative for fever.  Positive for generalized weakness. Cardiovascular: Negative for chest pain. Respiratory: Positive for shortness of breath. Gastrointestinal: Negative for abdominal pain, vomiting Musculoskeletal: Negative for musculoskeletal complaints Neurological: Negative for headache All other ROS negative  ____________________________________________   PHYSICAL EXAM:  VITAL SIGNS: ED Triage Vitals  Enc Vitals Group     BP 09/09/20 1734 (!) 169/66     Pulse Rate 09/09/20 1732 (!) 58     Resp 09/09/20 1732 18     Temp 09/09/20 1732 98.7 F (37.1 C)     Temp Source 09/09/20 1732 Oral     SpO2 09/09/20 1732 93 %     Weight 09/09/20 1733 110 lb 3.7 oz (50 kg)     Height 09/09/20 1733 '5\' 3"'$  (1.6 m)     Head Circumference --      Peak Flow --      Pain Score 09/09/20 1733 0     Pain Loc --      Pain Edu? --      Excl. in [place]? --    Constitutional: Alert and oriented. Well appearing and in no distress.  Hard of hearing. Eyes: Normal exam ENT      Head: Normocephalic and atraumatic.      Mouth/Throat: Mucous membranes are moist. Cardiovascular: Normal rate, regular rhythm Respiratory: Normal respiratory effort without tachypnea nor retractions.  Breath sounds are clear however diminished along bilateral bases. Gastrointestinal: Soft and nontender. No distention.   Musculoskeletal: Nontender with normal range of motion in all extremities. Neurologic:  Normal speech and language. No gross focal neurologic deficits  Skin:  Skin is warm, dry and intact.  Psychiatric: Mood and affect are normal.   ____________________________________________    EKG  EKG viewed and interpreted by myself shows a normal sinus rhythm at 64 bpm with a narrow QRS, normal axis, normal  intervals, nonspecific ST changes.  ____________________________________________    RADIOLOGY  Chest x-ray shows cardiomegaly with vascular congestion and pulmonary edema.  Moderate to large left and moderate right pleural effusions.  ____________________________________________   INITIAL IMPRESSION / ASSESSMENT AND PLAN / ED COURSE  Pertinent labs & imaging results that were available during my care of the patient were reviewed by me and considered in my medical  decision making (see chart for details).   Patient presents emergency department for worsening weakness and shortness of breath over the past 1 week.  History of a pleural effusion previously.  Patient has diminished breath sounds bilaterally in the bases concern for possible reaccumulation of pleural effusion.  Chest x-ray confirms large left-sided and moderate right-sided pleural effusions with vascular congestion and pulmonary edema.  We will dose Lasix.  Patient's labs also show hypokalemia we will dose potassium.  Urinalysis shows many bacteria we will send urine culture and place patient on Rocephin.  Patient will likely require drainage of her pleural effusions.  Given the patient's worsening shortness of breath and weakness we will admit to the hospital service for further work-up and treatment.  Currently satting 91 to 93% on room air during my evaluation.  IQRA SUHR was evaluated in Emergency Department on 09/09/2020 for the symptoms described in the history of present illness. She was evaluated in the context of the global COVID-19 pandemic, which necessitated consideration that the patient might be at risk for infection with the SARS-CoV-2 virus that causes COVID-19. Institutional protocols and algorithms that pertain to the evaluation of patients at risk for COVID-19 are in a state of rapid change based on information released by regulatory bodies including the CDC and federal and state organizations. These policies  and algorithms were followed during the patient's care in the ED.  ____________________________________________   FINAL CLINICAL IMPRESSION(S) / ED DIAGNOSES  Pulmonary edema Pleural effusions Urinary tract infection Hypokalemia   Harvest Dark, MD 09/09/20 2205

## 2020-09-09 NOTE — ED Notes (Signed)
XR at bedside

## 2020-09-09 NOTE — ED Notes (Signed)
Son reports blood thinner use, and reports blood thinner was held a few months ago for a colonoscopy, tumor, and polyp removal.

## 2020-09-10 ENCOUNTER — Inpatient Hospital Stay: Payer: Medicare Other

## 2020-09-10 DIAGNOSIS — I5021 Acute systolic (congestive) heart failure: Secondary | ICD-10-CM

## 2020-09-10 LAB — BASIC METABOLIC PANEL
Anion gap: 14 (ref 5–15)
BUN: 12 mg/dL (ref 8–23)
CO2: 29 mmol/L (ref 22–32)
Calcium: 9.2 mg/dL (ref 8.9–10.3)
Chloride: 104 mmol/L (ref 98–111)
Creatinine, Ser: 0.72 mg/dL (ref 0.44–1.00)
GFR, Estimated: >60 mL/min (ref >60)
Glucose, Bld: 112 mg/dL — ABNORMAL HIGH (ref 70–99)
Potassium: 2.9 mmol/L — ABNORMAL LOW (ref 3.5–5.1)
Sodium: 147 mmol/L — ABNORMAL HIGH (ref 135–145)

## 2020-09-10 LAB — CBC
HCT: 43 % (ref 36.0–46.0)
Hemoglobin: 13.6 g/dL (ref 12.0–15.0)
MCH: 29.3 pg (ref 26.0–34.0)
MCHC: 31.6 g/dL (ref 30.0–36.0)
MCV: 92.7 fL (ref 80.0–100.0)
Platelets: 334 10*3/uL (ref 150–400)
RBC: 4.64 MIL/uL (ref 3.87–5.11)
RDW: 19.9 % — ABNORMAL HIGH (ref 11.5–15.5)
WBC: 9 10*3/uL (ref 4.0–10.5)
nRBC: 0 % (ref 0.0–0.2)

## 2020-09-10 LAB — GLUCOSE, PLEURAL OR PERITONEAL FLUID: Glucose, Fluid: 142 mg/dL

## 2020-09-10 LAB — BODY FLUID CELL COUNT WITH DIFFERENTIAL
Eos, Fluid: 1 %
Lymphs, Fluid: 75 %
Monocyte-Macrophage-Serous Fluid: 14 %
Neutrophil Count, Fluid: 10 %

## 2020-09-10 LAB — PROTEIN, PLEURAL OR PERITONEAL FLUID: Total protein, fluid: <3 g/dL

## 2020-09-10 LAB — MAGNESIUM: Magnesium: 2.1 mg/dL (ref 1.7–2.4)

## 2020-09-10 LAB — SARS CORONAVIRUS 2 (TAT 6-24 HRS): SARS Coronavirus 2: NEGATIVE

## 2020-09-10 MED ORDER — SACUBITRIL-VALSARTAN 24-26 MG PO TABS
1.0000 | ORAL_TABLET | Freq: Two times a day (BID) | ORAL | Status: DC
  Administered 2020-09-10 – 2020-09-12 (×4): 1 via ORAL
  Filled 2020-09-10 (×4): qty 1

## 2020-09-10 MED ORDER — SPIRONOLACTONE 25 MG PO TABS
12.5000 mg | ORAL_TABLET | Freq: Every day | ORAL | Status: DC
  Administered 2020-09-10 – 2020-09-12 (×3): 12.5 mg via ORAL
  Filled 2020-09-10: qty 1
  Filled 2020-09-10 (×2): qty 0.5
  Filled 2020-09-10: qty 1
  Filled 2020-09-10: qty 0.5

## 2020-09-10 MED ORDER — POTASSIUM CHLORIDE CRYS ER 20 MEQ PO TBCR
40.0000 meq | EXTENDED_RELEASE_TABLET | ORAL | Status: AC
  Administered 2020-09-10 (×2): 40 meq via ORAL
  Filled 2020-09-10: qty 2

## 2020-09-10 NOTE — Progress Notes (Signed)
PROGRESS NOTE  [name] RENZE  DOB: [birth year]  PCP: [name], MD [id number]  DOA: 09/09/2020  LOS: 1 day  Hospital Day: 2   Chief Complaint  Patient presents with   Weakness    Brief narrative: [name] [name] is a 87 y.o. female with PMH significant for HTN, HLD, chronic systolic CHF  (EF 30 to [id number]), GERD, anxiety. Patient presented to the ED on 8/12 with complaint of progressively worsening generalized weakness, dyspnea, orthopnea, PND for a week.  On the day of admission, she woke up gasping for air.  Also reported urinary frequency and urgency.  In the ED, heart rate 50, blood pressure 169/66, 92% oxygen saturation on room air Labs with potassium low at 2.6 EKG with normal sinus rhythm at 64. Chest x-ray showed cardiomegaly with vascular congestion and pulm edema with moderate to large bilateral pleural effusion, left more than right. Patient was given IV Lasix in the ED.  Admitted to hospitalist service for further evaluation management.  Subjective: Patient was seen and examined this morning.  Pleasant elderly Caucasian female.  Lying on bed.  Not in distress.  On low-flow oxygen elegantly. Overnight, blood pressure has remained elevated as high as 206/90  Assessment/Plan: Acute exacerbation of chronic systolic CHF -Presented with worsening shortness of breath, orthopnea PND -Recent echo with EF 30 to 35%, severe left atrial dilatation. -Given IV Lasix 60 mg in the ED. currently on Lasix 40 mg IV twice daily. -Net IO Since Admission: -3,780 mL [09/10/20 1628] -Continue to monitor for daily intake output, weight, blood pressure, BNP, renal function and electrolytes. -Cardiology consulted.  Noted patient has been switched from losartan to Entresto and Aldactone.  I see bisoprolol 5 mg twice daily in her list of medicines but she was apparently not taking it. Recent Labs  Lab 09/09/20 1739 09/09/20 2355 09/10/20 0838  BUN 14  --  12  CREATININE  0.70  --  0.72  K 2.6*  --  2.9*  MG 2.3 2.1  --    Hypertensive urgency -Blood pressure elevated as high as 200 overnight -Currently on IV Lasix as well as CHF regimen with Entresto and Aldactone. -Continue to monitor blood pressure. -IV hydralazine as needed.  Bilateral moderate to large pleural effusion -IR consulted for thoracentesis -Underwent thoracentesis of the right side this afternoon, 750 mL fluid drained.  May need thoracentesis of the left as well.  Hypokalemia -Potassium level low at 2.6 on admission.  Despite replacement, potassium level is only somewhat better to 2.9 this morning.  Further replacement ordered.  Continue to monitor. Recent Labs  Lab 09/09/20 1739 09/09/20 2355 09/10/20 0838  K 2.6*  --  2.9*  MG 2.3 2.1  --     Paroxysmal A. Fib -It seems patient was not taking amiodarone, bisoprolol and Xarelto at home. -Continue to monitor.  Dyslipidemia -Continue statin  GERD -PPI, Iron supplement  Mobility: Encourage ambulation. Code Status:   Code Status: Full Code  Nutritional status: Body mass index is 19.53 kg/m.     Diet:  Diet Order             Diet Heart Room service appropriate? Yes; Fluid consistency: Thin  Diet effective now                  DVT prophylaxis:  enoxaparin (LOVENOX) injection 40 mg Start: 09/09/20 2300   Antimicrobials: IV Rocephin Fluid: None Consultants: Cardiology Family Communication: None at bedside  Status is: Inpatient  Remains  inpatient appropriate because: Needs further diuresis  Dispo: The patient is from: Home              Anticipated d/c is to: Home, likely, pending PT              Patient currently is not medically stable to d/c.   Difficult to place patient No     Infusions:   cefTRIAXone (ROCEPHIN)  IV      Scheduled Meds:  enoxaparin (LOVENOX) injection  40 mg Subcutaneous Q24H   furosemide  40 mg Intravenous Q12H   potassium chloride  40 mEq Oral Q2H   sacubitril-valsartan  1  tablet Oral BID   spironolactone  12.5 mg Oral Daily    Antimicrobials: Anti-infectives (From admission, onward)    Start     Dose/Rate Route Frequency Ordered Stop   09/10/20 2100  cefTRIAXone (ROCEPHIN) 1 g in sodium chloride 0.9 % 100 mL IVPB        1 g 200 mL/hr over 30 Minutes Intravenous Every 24 hours 09/09/20 2241     09/09/20 2130  cefTRIAXone (ROCEPHIN) 1 g in sodium chloride 0.9 % 100 mL IVPB        1 g 200 mL/hr over 30 Minutes Intravenous  Once 09/09/20 2118 09/09/20 2207       PRN meds: acetaminophen **OR** acetaminophen, hydrALAZINE, magnesium hydroxide, ondansetron **OR** ondansetron (ZOFRAN) IV, traZODone   Objective: Vitals:   09/10/20 1419 09/10/20 1515  BP: 138/87 (!) 167/67  Pulse: 64 63  Resp:  17  Temp:    SpO2: 94% 98%    Intake/Output Summary (Last 24 hours) at 09/10/2020 1629 Last data filed at 09/10/2020 1343 Gross per 24 hour  Intake 120 ml  Output 3900 ml  Net -3780 ml   Filed Weights   09/09/20 1733  Weight: 50 kg   Weight change:  Body mass index is 19.53 kg/m.   Physical Exam: General exam: Pleasant, elderly Caucasian female.  Not in distress Skin: No rashes, lesions or ulcers. HEENT: Atraumatic, normocephalic, no obvious bleeding Lungs: Diminished air entry in both bases CVS: Regular rate and rhythm, no murmur GI/Abd soft, nontender, nondistended, bowel sound present CNS: Alert, awake, oriented x3 Psychiatry: Cheerful Extremities: Trace bilateral pedal edema, no calf tenderness  Data Review: I have personally reviewed the laboratory data and studies available.  Recent Labs  Lab 09/10/20 0838  WBC 9.0  HGB 13.6  HCT 43.0  MCV 92.7  PLT 334   Recent Labs  Lab 09/09/20 1739 09/09/20 2355 09/10/20 0838  NA 141  --  147*  K 2.6*  --  2.9*  CL 100  --  104  CO2 26  --  29  GLUCOSE 73  --  112*  BUN 14  --  12  CREATININE 0.70  --  0.72  CALCIUM 9.0  --  9.2  MG 2.3 2.1  --     F/u labs ordered Unresulted  Labs (From admission, onward)     Start     Ordered   09/11/20 0500  CBC with Differential/Platelet  Daily,   STAT      09/10/20 0841   09/11/20 [id number]  Basic metabolic panel  Daily,   STAT      09/10/20 0841   09/11/20 0500  Phosphorus  Tomorrow morning,   STAT        09/10/20 0841   09/11/20 0500  Magnesium  Tomorrow morning,   STAT  09/10/20 0841   09/11/20 0500  Brain natriuretic peptide  Tomorrow morning,   STAT        09/10/20 0841   09/10/20 1434  PH, Body Fluid  RELEASE UPON ORDERING,   STAT        09/10/20 1434   09/10/20 1434  Body fluid culture w Gram Stain  RELEASE UPON ORDERING,   STAT        09/10/20 1434   09/10/20 1415  Protein, body fluid (other)  Once,   R        09/10/20 1415   09/09/20 2118  Urine Culture  Add-on,   AD       Question:  Indication  Answer:  Urgency/frequency   09/09/20 2118   09/09/20 1800  CBC  Once,   STAT        09/09/20 1800   09/09/20 1739  Brain natriuretic peptide  Once,   STAT        09/09/20 1738            Signed, Terrilee Croak, MD [facility] 09/10/2020

## 2020-09-10 NOTE — ED Notes (Signed)
Breakfast tray given. No other needs found at this moment.

## 2020-09-10 NOTE — Procedures (Signed)
PROCEDURE SUMMARY:  Successful US guided right thoracentesis. Yielded 750 ml of clear yellow fluid. Pt tolerated procedure well. No immediate complications.  Specimen sent for labs. CXR ordered; results pending  EBL < 2 mL  Theresa Duty, NP 09/10/2020 2:35 PM

## 2020-09-10 NOTE — ED Notes (Signed)
Inc care performed. Allevyn foam dressing applied.

## 2020-09-10 NOTE — ED Notes (Signed)
This tech provided pt with a complete wiped bath. Inc care performed. Linen change, new purewick and clean dry brief applied.

## 2020-09-10 NOTE — Consult Note (Signed)
CARDIOLOGY CONSULT NOTE               Patient ID: [name] [name] MRN: [id number] DOB/AGE: January 11, 1930 87 y.o.  Admit date: 09/09/2020 Referring Physician Dr Terrilee Croak  Primary Physician Dr. Netty Starring Primary Cardiologist Dr. Serafina Royals Reason for Consultation shortness of breath acute congestive heart failure  HPI: Patient is a 87 year old female with history of congestive heart failure cardiomyopathy presents with poorly controlled blood pressure shortness of breath dyspnea generalized weakness.  Patient complains of dry cough and wheezing dyspnea on exertion woke up gasping for air she had urinary frequency but no chest pain still came to the emergency room for evaluation patient has had thoracentesis in the past now with worsening dyspnea low sats negative COVID.  Review of systems complete and found to be negative unless listed above     Past Medical History:  Diagnosis Date   Anemia    Anxiety    Cancer ([place])    Squamous Cell Carcinoma (top of head)   Colon cancer ([place])    Constipation, chronic    Deaf, left    Diverticular disease    GERD (gastroesophageal reflux disease)    Hearing aid worn    Hearing deficit    History of Bell's palsy    left side - resolved - 50 yrs ago   Hypertension    Internal hemorrhoids    Iron deficiency anemia    Mitral regurgitation    moderate to severe on 03/27/20 echo   Osteopenia    Pure hypercholesterolemia     Past Surgical History:  Procedure Laterality Date   CATARACT EXTRACTION W/PHACO Left 11/16/2015   Procedure: CATARACT EXTRACTION PHACO AND INTRAOCULAR LENS PLACEMENT ([place]);  Surgeon: Leandrew Koyanagi, MD;  Location: [facility];  Service: Ophthalmology;  Laterality: Left;  TORIC   CATARACT EXTRACTION W/PHACO Right 12/07/2015   Procedure: CATARACT EXTRACTION PHACO AND INTRAOCULAR LENS PLACEMENT ([facility]);  Surgeon: Leandrew Koyanagi, MD;  Location: [place];  Service: Ophthalmology;   Laterality: Right;  TORIC   COLONOSCOPY WITH PROPOFOL N/A 08/10/2014   Procedure: COLONOSCOPY WITH PROPOFOL;  Surgeon: Lollie Sails, MD;  Location: [facility] ENDOSCOPY;  Service: Endoscopy;  Laterality: N/A;   COLONOSCOPY WITH PROPOFOL N/A 05/12/2020   Procedure: COLONOSCOPY WITH PROPOFOL;  Surgeon: Lin Landsman, MD;  Location: [facility] ENDOSCOPY;  Service: Gastroenterology;  Laterality: N/A;   ESOPHAGOGASTRODUODENOSCOPY (EGD) WITH PROPOFOL N/A 03/28/2020   Procedure: ESOPHAGOGASTRODUODENOSCOPY (EGD) WITH PROPOFOL;  Surgeon: Lucilla Lame, MD;  Location: [facility] ENDOSCOPY;  Service: Endoscopy;  Laterality: N/A;   EYE SURGERY     FEMUR FRACTURE SURGERY     FRACTURE SURGERY     GIVENS CAPSULE STUDY N/A 04/19/2020   Procedure: GIVENS CAPSULE STUDY;  Surgeon: Lin Landsman, MD;  Location: [facility] END[facility];  Service: Gastroenterology;  Laterality: N/A;   LAPAROSCOPIC RIGHT HEMI COLECTOMY Right 08/04/2020   Procedure: LAPAROSCOPIC RIGHT HEMI COLECTOMY;  Surgeon: [name], MD;  Location: [facility];  Service: General;  Laterality: Right;   Nasal Polyp Removal      (Not in a hospital admission)  Social History   Socioeconomic History   Marital status: Widowed    Spouse name: Not on file   Number of children: Not on file   Years of education: Not on file   Highest education level: Not on file  Occupational History   Not on file  Tobacco Use   Smoking status: Former    Types: Cigarettes  Quit date: 10/30/1985    Years since quitting: 34.8   Smokeless tobacco: Never  Vaping Use   Vaping Use: Never used  Substance and Sexual Activity   Alcohol use: No   Drug use: No   Sexual activity: Not Currently  Other Topics Concern   Not on file  Social History Narrative   Not on file   Social Determinants of Health   Financial Resource Strain: Not on file  Food Insecurity: Not on file  Transportation Needs: Not on file  Physical Activity: Not on file  Stress: Not on file  Social  Connections: Not on file  Intimate Partner Violence: Not on file    Family History  Problem Relation Age of Onset   Colon cancer Mother       Review of systems complete and found to be negative unless listed above      PHYSICAL EXAM  General: Well developed, well nourished, in no acute distress HEENT:  Normocephalic and atramatic Neck:  No JVD.  Lungs: Diminished bilaterally to auscultation and percussion.  Basilar Rales and dullness Heart: HRRR . Normal S1 and S2 without gallops or murmurs.  Abdomen: Bowel sounds are positive, abdomen soft and non-tender  Msk:  Back normal, normal gait. Normal strength and tone for age. Extremities: No clubbing, cyanosis or edema.   Neuro: Alert and oriented X 3. Psych:  Good affect, responds appropriately  Labs:   Lab Results  Component Value Date   WBC 13.7 (H) 08/15/2020   HGB 10.3 (L) 08/15/2020   HCT 34.0 (L) 08/15/2020   MCV 91.9 08/15/2020   PLT 582 (H) 08/15/2020    Recent Labs  Lab 09/09/20 1739  NA 141  K 2.6*  CL 100  CO2 26  BUN 14  CREATININE 0.70  CALCIUM 9.0  GLUCOSE 73   No results found for: CKTOTAL, CKMB, CKMBINDEX, TROPONINI  Lab Results  Component Value Date   CHOL 107 01/12/2020   Lab Results  Component Value Date   HDL 46 01/12/2020   Lab Results  Component Value Date   LDLCALC 44 01/12/2020   Lab Results  Component Value Date   TRIG 84 01/12/2020   Lab Results  Component Value Date   CHOLHDL 2.3 01/12/2020   No results found for: LDLDIRECT    Radiology: DG Chest Portable 1 View  Result Date: 09/09/2020 CLINICAL DATA:  Weakness EXAM: PORTABLE CHEST 1 VIEW COMPARISON:  08/07/2020, CT 03/28/2020 FINDINGS: Small-moderate right and moderate to large left pleural effusion, increased compared to prior. Cardiomegaly with vascular congestion and edema. Airspace disease at both bases. IMPRESSION: Cardiomegaly with vascular congestion and pulmonary edema. Moderate to large left and small moderate  right pleural effusions with basilar airspace disease. Electronically Signed   By: Donavan Foil M.D.   On: 09/09/2020 21:57    EKG: Normal sinus rhythm rate of around 70 nonspecific ST-T wave changes  ASSESSMENT AND PLAN:  Acute on chronic congestive heart failure Known moderate cardiomyopathy EF around 30 to 35% Bilateral pleural effusions Shortness of breath Hypertensive urgency Paroxysmal atrial fibrillation Hyperlipidemia GERD . Plan Agree with admit to telemetry Supplemental oxygen as necessary Agree with IV Lasix therapy for diuresis Hold Xarelto in anticipation of thoracentesis Aggressive hypertension management and control Correct electrolytes especially hypokalemia Recommend thoracentesis bilaterally left worse than right Advanced heart failure therapy Consider switching to Entresto from losartan 24/26 Continue beta-blockade therapy Recommend adding spironolactone Continue statin therapy for hyperlipidemia Maintain Protonix therapy for reflux type symptoms  Signed: [name] MD, 09/10/2020, 8:04 AM

## 2020-09-11 ENCOUNTER — Encounter: Payer: Self-pay | Admitting: Family Medicine

## 2020-09-11 LAB — CBC WITH DIFFERENTIAL/PLATELET
Abs Immature Granulocytes: 0.04 10*3/uL (ref 0.00–0.07)
Basophils Absolute: 0.1 10*3/uL (ref 0.0–0.1)
Basophils Relative: 1 %
Eosinophils Absolute: 0.3 10*3/uL (ref 0.0–0.5)
Eosinophils Relative: 3 %
HCT: 48.3 % — ABNORMAL HIGH (ref 36.0–46.0)
Hemoglobin: 14.8 g/dL (ref 12.0–15.0)
Immature Granulocytes: 1 %
Lymphocytes Relative: 15 %
Lymphs Abs: 1.2 10*3/uL (ref 0.7–4.0)
MCH: 28.5 pg (ref 26.0–34.0)
MCHC: 30.6 g/dL (ref 30.0–36.0)
MCV: 93.1 fL (ref 80.0–100.0)
Monocytes Absolute: 1 10*3/uL (ref 0.1–1.0)
Monocytes Relative: 12 %
Neutro Abs: 5.5 10*3/uL (ref 1.7–7.7)
Neutrophils Relative %: 68 %
Platelets: 357 10*3/uL (ref 150–400)
RBC: 5.19 MIL/uL — ABNORMAL HIGH (ref 3.87–5.11)
RDW: 20.2 % — ABNORMAL HIGH (ref 11.5–15.5)
WBC: 8 10*3/uL (ref 4.0–10.5)
nRBC: 0 % (ref 0.0–0.2)

## 2020-09-11 LAB — BASIC METABOLIC PANEL
Anion gap: 12 (ref 5–15)
BUN: 14 mg/dL (ref 8–23)
CO2: 30 mmol/L (ref 22–32)
Calcium: 9.3 mg/dL (ref 8.9–10.3)
Chloride: 102 mmol/L (ref 98–111)
Creatinine, Ser: 0.86 mg/dL (ref 0.44–1.00)
GFR, Estimated: >60 mL/min (ref >60)
Glucose, Bld: 112 mg/dL — ABNORMAL HIGH (ref 70–99)
Potassium: 4.2 mmol/L (ref 3.5–5.1)
Sodium: 144 mmol/L (ref 135–145)

## 2020-09-11 LAB — MAGNESIUM: Magnesium: 2.2 mg/dL (ref 1.7–2.4)

## 2020-09-11 LAB — BRAIN NATRIURETIC PEPTIDE: B Natriuretic Peptide: 1842.6 pg/mL — ABNORMAL HIGH (ref 0.0–100.0)

## 2020-09-11 LAB — PHOSPHORUS: Phosphorus: 3 mg/dL (ref 2.5–4.6)

## 2020-09-11 NOTE — TOC Initial Note (Addendum)
Transition of Care [facility]) - Initial/Assessment Note    Patient Details  Name: [name] [name] MRN: [id number] Date of Birth: 1929-07-08  Transition of Care [facility]) CM/SW Contact:    Magnus Ivan, LCSW Phone Number: 09/11/2020, 10:15 AM  Clinical Narrative:                CSW spoke to patient's son Percell Miller via discharge planning. Patient lives with son who provides transportation. PCP is Dr. Netty Starring. Pharmacy is [facility]. Patient has a RW, lift recliner, rails and seat in shower, and 2 stair lifts at home. Patient went to [facility] for short term rehab, was there 3 or 4 days and unhappy with it. Patient had [facility] recently, but son is unsure of agency used. Explained that PT eval is pending. If SNF is recommended, son would be agreeable to [facility] only. Would also be agreeable to [facility] being arranged with previously used agency (will need to see which agency this was). Son is interested in getting a motorized wheelchair for patient to use, he will reach out to PCP for assistance with this. Son had questions about getting patient's fluid drained on a regular basis, informed him he would need to follow up with Cardiology/PCP to see if this is an option. TOC to follow for discharge planning.   10:56- Spoke to Clayton with [facility], they were previously active with this patient. They can accept patient back if patient does not go to SNF.   Expected Discharge Plan: [place] Barriers to Discharge: Continued Medical Work up   Patient Goals and CMS Choice Patient states their goals for this hospitalization and ongoing recovery are:: TBD CMS Medicare.gov Compare Post Acute Care list provided to:: Patient Represenative (must comment) Choice offered to / list presented to : Adult Children  Expected Discharge Plan and Services Expected Discharge Plan: [place]       Living arrangements for the past 2 months: Single Family Home                                       Prior Living Arrangements/Services Living arrangements for the past 2 months: Single Family Home Lives with:: Adult Children Patient language and need for interpreter reviewed:: Yes Do you feel safe going back to the place where you live?: Yes      Need for Family Participation in Patient Care: Yes (Comment) Care giver support system in place?: Yes (comment) Current home services: DME Criminal Activity/Legal Involvement Pertinent to Current Situation/Hospitalization: No - Comment as needed  Activities of Daily Living Home Assistive Devices/Equipment: Environmental consultant (specify type), Hearing aid, Eyeglasses ADL Screening (condition at time of admission) Patient's cognitive ability adequate to safely complete daily activities?: Yes Is the patient deaf or have difficulty hearing?: Yes Does the patient have difficulty seeing, even when wearing glasses/contacts?: No Does the patient have difficulty concentrating, remembering, or making decisions?: No Patient able to express need for assistance with ADLs?: Yes Does the patient have difficulty dressing or bathing?: No Independently performs ADLs?: Yes (appropriate for developmental age) Does the patient have difficulty walking or climbing stairs?: Yes Weakness of Legs: None Weakness of Arms/Hands: None  Permission Sought/Granted Permission sought to share information with : Facility Art therapist granted to share information with : Yes, Verbal Permission Granted (by son)     Permission granted to share  info w AGENCY: HH, DME, SNF        Emotional Assessment       Orientation: : Oriented to Self, Oriented to Place, Oriented to  Time Alcohol / Substance Use: Not Applicable Psych Involvement: No (comment)  Admission diagnosis:  Hypokalemia [E87.6] Acute pulmonary edema (HCC) [J81.0] Weakness [R53.1] SOB (shortness of breath) [R06.02] Pleural effusion [J90] Acute CHF (congestive heart  failure) (HCC) [I50.9] Urinary tract infection without hematuria, site unspecified [N39.0] Patient Active Problem List   Diagnosis Date Noted   Acute CHF (congestive heart failure) ([place]) 09/09/2020   Pressure injury of skin 08/07/2020   Confusion, postoperative 08/07/2020   Acute pulmonary edema ([place]) 08/07/2020   Diverticular disease 08/07/2020   GERD (gastroesophageal reflux disease) 08/07/2020   History of Bell's palsy 08/07/2020   Mitral regurgitation 08/07/2020   Cancer of ascending colon s/p lap colectomy 08/04/2020 06/01/2020   Colonic mass    Closed intertrochanteric fracture ([place]) 04/12/2020   Colles' fracture 04/12/2020   Osteoarthritis of knee 04/12/2020   Symptomatic anemia 03/25/2020   HLD (hyperlipidemia) 03/25/2020   Bilateral leg edema 02/04/2020   Rapid atrial fibrillation ([place]) 01/11/2020   Chronic anticoagulation 01/11/2020   Chronic ulcer of left leg ([place]) 01/11/2020   Iron deficiency anemia 01/11/2020   Hypokalemia 01/01/2020   Left leg cellulitis 01/01/2020   Iron deficiency anemia due to chronic blood loss 01/01/2020   Wound cellulitis 12/31/2019   Aortic atherosclerosis ([place]) 09/25/2019   HOH (hard of hearing) 08/31/2019   History of pelvic fracture 08/31/2019   Pelvic fracture (HCC) 08/25/2019   Permanent atrial fibrillation ([place]) 08/25/2019   Pain    Dehydration    Accidental fall 07/26/2019   UTI (urinary tract infection) 07/26/2019   Closed fracture of right inferior pubic ramus ([place]) 07/26/2019   HTN (hypertension) 07/26/2019   Ambulatory dysfunction 07/26/2019   Pubic bone fracture ([place]) 07/26/2019   History of colon polyps 06/25/2017   Constipation, chronic 07/27/2013   Osteopenia 07/27/2013   PCP:  [name], MD Pharmacy:   [place], [place] - [address] [place] [address] Phone: [phone] Fax: [phone]     Social Determinants of Health (SDOH) Interventions     Readmission Risk Interventions Readmission Risk Prevention Plan 09/11/2020  Transportation Screening Complete  PCP or Specialist Appt within 3-5 Days Complete  HRI or Home Care Consult Complete  Social Work Consult for [place] Planning/Counseling Complete  Palliative Care Screening Complete  Medication Review Press photographer) Complete  Some recent data might be hidden

## 2020-09-11 NOTE — Evaluation (Addendum)
Physical Therapy Evaluation Patient Details Name: [name] [name] MRN: [id number] DOB: [birth year] Today's Date: 09/11/2020   History of Present Illness  Pt is a is a 87 year old female with history of congestive heart failure cardiomyopathy, anxiety, colon cancer, HTN, deafness in L ear, GERD, Pt presents with poorly controlled blood pressure shortness of breath dyspnea generalized weakness (per chart) Pt s/p thoracentesis Acute exacerbation of chronic systolic CHF on [id number].  Clinical Impression  Pt is AOx4, pleasant throughout therapy. Pt is HOH and requires hearing aids before initiating a conversation. Pt states her PLOF as being modified-independent and utilizes 2-RW intermittently for household distances, 4-RW for community distances. Pt currently resides with her son who is able to provide support 24/7.   Pt is min-guard for bed mobility during supine > sit with HOB elevated and bed rails. PT assisted to [facility] due to BM, and pt performed STS x 2 w/ min,guard and RW. Pt required min-A for initial bed >stand transfer, RW from lowered surface height. Cues for hand placement and body position decreased level of assistance necessary. Pt ambulated w/ 2-RW w/ a step-through pattern while demonstrating ability to navigate around obstacles in room without signs of instability or LOB.Pt states she is close to her baseline functioning following session. HHPT is recommended at discharge due to having 24/7 assistance if necessary. Skilled PT intervention is indicated to address deficits in function, mobility, and to return to PLOF as able.      Follow Up Recommendations Home health PT;Supervision for mobility/OOB    Equipment Recommendations  None recommended by PT    Recommendations for Other Services       Precautions / Restrictions Precautions Precautions: Fall Restrictions Weight Bearing Restrictions: No      Mobility  Bed Mobility Overal bed mobility: Needs Assistance Bed Mobility:  Supine to Sit     Supine to sit: Min guard;HOB elevated     General bed mobility comments: Pt utilizes bed rails for assistance with HOB elevated.    Transfers Overall transfer level: Needs assistance Equipment used: Rolling walker (2 wheeled) Transfers: Sit to/from Stand Sit to Stand: Min assist         General transfer comment: Min-A required x 1 bed > stand from lowered surface height;  min-gaurd x 2 from [facility] > stand  Ambulation/Gait Ambulation/Gait assistance: Min guard Gait Distance (Feet): 20 Feet Assistive device: Rolling walker (2 wheeled) Gait Pattern/deviations: Step-through pattern     General Gait Details: Pt is able to maneuver RW around obstacles, tight spaces without instability or LOB  Stairs            Wheelchair Mobility    Modified Rankin (Stroke Patients Only)       Balance Overall balance assessment: Needs assistance Sitting-balance support: Feet supported;No upper extremity supported Sitting balance-Leahy Scale: Good       Standing balance-Leahy Scale: Fair Standing balance comment: Without UE support,utilized BSC for stability against LE;                             Pertinent Vitals/Pain Pain Assessment: No/denies pain    Home Living Family/patient expects to be discharged to:: Private residence Living Arrangements: Children (son) Available Help at Discharge: Family;Available 24 hours/day Type of Home: House Home Access: Stairs to enter Entrance Stairs-Rails: Can reach both Entrance Stairs-Number of Steps: 4 Home Layout: Multi-level;1/2 bath on main level;Bed/bath upstairs Home Equipment: Walker - 2 wheels;Walker - 4 wheels;Cane - single  point;Shower seat;Grab bars - tub/shower Additional Comments: Pt states a chair lift is installed from basement entery to 2nd floor and does not need to climb stairs to enter the home.    Prior Function Level of Independence: Independent with assistive device(s)          Comments: Pt states mod-I with ambulation using 2-RW household distances and 4-RW for community distances.     Hand Dominance   Dominant Hand: Right    Extremity/Trunk Assessment   Upper Extremity Assessment Upper Extremity Assessment: Overall WFL for tasks assessed    Lower Extremity Assessment Lower Extremity Assessment: Overall WFL for tasks assessed (SILT to BLE)       Communication   Communication: HOH  Cognition Arousal/Alertness: Awake/alert Behavior During Therapy: WFL for tasks assessed/performed Overall Cognitive Status: Within Functional Limits for tasks assessed                                 General Comments: AOx4      General Comments      Exercises Other Exercises Other Exercises: Pt had BM during transfer, PT cleaned up w/ pt performing STSx3, RW. min-gaurd during pericare and cleaning. Pt was able to perform 1/2 pericare while standing w/ RW.   Assessment/Plan    PT Assessment Patient needs continued PT services  PT Problem List Decreased strength;Decreased range of motion;Decreased activity tolerance;Decreased balance;Decreased mobility;Decreased coordination       PT Treatment Interventions Gait training;Stair training;Functional mobility training;Therapeutic activities;Therapeutic exercise;Balance training;Neuromuscular re-education    PT Goals (Current goals can be found in the Care Plan section)  Acute Rehab PT Goals Patient Stated Goal: To go home PT Goal Formulation: With patient Time For Goal Achievement: 09/25/20 Potential to Achieve Goals: Good    Frequency Min 2X/week   Barriers to discharge        Co-evaluation               AM-PAC PT "6 Clicks" Mobility  Outcome Measure Help needed turning from your back to your side while in a flat bed without using bedrails?: None Help needed moving from lying on your back to sitting on the side of a flat bed without using bedrails?: A Little Help needed moving to  and from a bed to a chair (including a wheelchair)?: A Little Help needed standing up from a chair using your arms (e.g., wheelchair or bedside chair)?: A Little Help needed to walk in hospital room?: A Little Help needed climbing 3-5 steps with a railing? : A Little 6 Click Score: 19    End of Session Equipment Utilized During Treatment: Gait belt Activity Tolerance: Patient tolerated treatment well Patient left: in chair;with call bell/phone within reach;with chair alarm set Nurse Communication: Mobility status PT Visit Diagnosis: Muscle weakness (generalized) (M62.81);Other abnormalities of gait and mobility (R26.89)    Time: [id number] PT Time Calculation (min) (ACUTE ONLY): 55 min   Charges:             [employer], SPT

## 2020-09-11 NOTE — Progress Notes (Signed)
PROGRESS NOTE  [name] [name]  DOB: 1929-07-02  PCP: [name], MD [id number]  DOA: 09/09/2020  LOS: 2 days  Hospital Day: 3   Chief Complaint  Patient presents with   Weakness    Brief narrative: [name] [name] is a 87 y.o. female with PMH significant for HTN, HLD, chronic systolic CHF  (EF 30 to [id number]), GERD, anxiety. Patient presented to the ED on 8/12 with complaint of progressively worsening generalized weakness, dyspnea, orthopnea, PND for a week.  On the day of admission, she woke up gasping for air.  Also reported urinary frequency and urgency.  In the ED, heart rate 50, blood pressure 169/66, 92% oxygen saturation on room air Labs with potassium low at 2.6 EKG with normal sinus rhythm at 64. Chest x-ray showed cardiomegaly with vascular congestion and pulm edema with moderate to large bilateral pleural effusion, left more than right. Patient was given IV Lasix in the ED.  Admitted to hospitalist service for further evaluation management.  Subjective: Patient was seen and examined this morning.   Sitting up in chair.  Not in distress.  Breathing better.  Not on supplemental oxygen.  Feels better after right pleural effusion was tapped yesterday.    Assessment/Plan: Acute exacerbation of chronic systolic CHF -Presented with worsening shortness of breath, orthopnea PND -Recent echo with EF 30 to 35%, severe left atrial dilatation. Currently on Lasix 40 mg IV twice daily along with Entresto, Aldactone.  Not on beta-blocker because of reported allergy. -Net IO Since Admission: -4,826.28 mL [09/11/20 1413] -Continue to monitor for daily intake output, weight, blood pressure, BNP, renal function and electrolytes. -Cardiology consult appreciated.   Recent Labs  Lab 09/09/20 1739 09/09/20 2355 09/10/20 0838 09/11/20 0546  BNP  --   --   --  1,842.6*  BUN 14  --  12 14  CREATININE 0.70  --  0.72 0.86  K 2.6*  --  2.9* 4.2  MG 2.3 2.1  --  2.2     Hypertensive urgency -Blood pressure elevated as high as 200 overnight -Currently on IV Lasix as well as CHF regimen with Entresto and Aldactone. -Continue to monitor blood pressure. -IV hydralazine as needed.  Bilateral moderate to large pleural effusion -IR consulted for thoracentesis -8/13 underwent 715 mL of fluid removed by thoracentesis of the right side.  Repeat chest x-ray tomorrow morning.  If left side continues to have fluid.  Will order for another thoracentesis.   Hypokalemia -Potassium level improved after replacement. Recent Labs  Lab 09/09/20 1739 09/09/20 2355 09/10/20 0838 09/11/20 0546  K 2.6*  --  2.9* 4.2  MG 2.3 2.1  --  2.2  PHOS  --   --   --  3.0    Paroxysmal A. Fib -It seems patient was not taking any AV nodal blocking agent at home. -Continue to monitor.  Dyslipidemia -Continue statin  GERD -PPI, Iron supplement  Mobility: Encourage ambulation. Code Status:   Code Status: Full Code  Nutritional status: Body mass index is 19.76 kg/m.     Diet:  Diet Order             Diet Heart Room service appropriate? Yes; Fluid consistency: Thin  Diet effective now                  DVT prophylaxis:  enoxaparin (LOVENOX) injection 40 mg Start: 09/09/20 2300   Antimicrobials: IV Rocephin Fluid: None Consultants: Cardiology Family Communication: None at bedside  Status is:  Inpatient  Remains inpatient appropriate because: Needs further diuresis  Dispo: The patient is from: Home              Anticipated d/c is to: Home, likely, pending PT              Patient currently is not medically stable to d/c.   Difficult to place patient No     Infusions:   cefTRIAXone (ROCEPHIN)  IV 1 g (09/10/20 2218)    Scheduled Meds:  enoxaparin (LOVENOX) injection  40 mg Subcutaneous Q24H   furosemide  40 mg Intravenous Q12H   sacubitril-valsartan  1 tablet Oral BID   spironolactone  12.5 mg Oral Daily    Antimicrobials: Anti-infectives  (From admission, onward)    Start     Dose/Rate Route Frequency Ordered Stop   09/10/20 2100  cefTRIAXone (ROCEPHIN) 1 g in sodium chloride 0.9 % 100 mL IVPB        1 g 200 mL/hr over 30 Minutes Intravenous Every 24 hours 09/09/20 2241     09/09/20 2130  cefTRIAXone (ROCEPHIN) 1 g in sodium chloride 0.9 % 100 mL IVPB        1 g 200 mL/hr over 30 Minutes Intravenous  Once 09/09/20 2118 09/09/20 2207       PRN meds: acetaminophen **OR** acetaminophen, hydrALAZINE, magnesium hydroxide, ondansetron **OR** ondansetron (ZOFRAN) IV, traZODone   Objective: Vitals:   09/11/20 0100 09/11/20 0545  BP: (!) 146/72 (!) 162/85  Pulse: 68 60  Resp: 17 18  Temp: 98.2 F (36.8 C) 98.5 F (36.9 C)  SpO2: 96% 95%    Intake/Output Summary (Last 24 hours) at 09/11/2020 1413 Last data filed at 09/11/2020 1308 Gross per 24 hour  Intake 453.72 ml  Output 1500 ml  Net -1046.28 ml    Filed Weights   09/09/20 1733 09/11/20 0700  Weight: 50 kg 50.6 kg   Weight change: 0.6 kg Body mass index is 19.76 kg/m.   Physical Exam: General exam: Pleasant, elderly Caucasian female.  Not in distress Skin: No rashes, lesions or ulcers. HEENT: Atraumatic, normocephalic, no obvious bleeding Lungs: Diminished air entry in the left side.  Clear to auscultation otherwise. CVS: Regular rate and rhythm, no murmur GI/Abd soft, nontender, nondistended, bowel sound present CNS: Alert, awake, oriented x3 Psychiatry: Cheerful Extremities: Improved bilateral pedal edema, no calf tenderness  Data Review: I have personally reviewed the laboratory data and studies available.  Recent Labs  Lab 09/10/20 0838 09/11/20 0546  WBC 9.0 8.0  NEUTROABS  --  5.5  HGB 13.6 14.8  HCT 43.0 48.3*  MCV 92.7 93.1  PLT 334 357    Recent Labs  Lab 09/09/20 1739 09/09/20 2355 09/10/20 0838 09/11/20 0546  NA 141  --  147* 144  K 2.6*  --  2.9* 4.2  CL 100  --  104 102  CO2 26  --  29 30  GLUCOSE 73  --  112* 112*   BUN 14  --  12 14  CREATININE 0.70  --  0.72 0.86  CALCIUM 9.0  --  9.2 9.3  MG 2.3 2.1  --  2.2  PHOS  --   --   --  3.0     F/u labs ordered Unresulted Labs (From admission, onward)     Start     Ordered   09/11/20 0500  CBC with Differential/Platelet  Daily,   STAT      09/10/20 0841   09/11/20 0500  Basic metabolic panel  Daily,   STAT      09/10/20 0841   09/10/20 1434  PH, Body Fluid  RELEASE UPON ORDERING,   STAT        09/10/20 1434   09/10/20 1415  Protein, body fluid (other)  Once,   R        09/10/20 1415   09/09/20 1800  CBC  Once,   R        09/09/20 1800   09/09/20 1739  Brain natriuretic peptide  Once,   STAT        09/09/20 1738            Signed, Terrilee Croak, MD [facility] 09/11/2020

## 2020-09-11 NOTE — Progress Notes (Signed)
[name] [name] - [facility]    SUBJECTIVE: Patient doing much better less shortness of breath blood pressure much improved consider thoracentesis for shortness of breath dyspnea heart failure.  Tolerating Entresto well.  Patient reportedly allergic to beta-blockers   Vitals:   09/10/20 1948 09/11/20 0100 09/11/20 0545 09/11/20 0700  BP: (!) 153/69 (!) 146/72 (!) 162/85   Pulse: 66 68 60   Resp: '18 17 18   '$ Temp: 98.1 F (36.7 C) 98.2 F (36.8 C) 98.5 F (36.9 C)   TempSrc:  Oral Oral   SpO2: 96% 96% 95%   Weight:    50.6 kg  Height:         Intake/Output Summary (Last 24 hours) at 09/11/2020 0810 Last data filed at 09/11/2020 0600 Gross per 24 hour  Intake 213.72 ml  Output 2400 ml  Net -2186.28 ml      PHYSICAL EXAM  General: Well developed, well nourished, in no acute distress HEENT:  Normocephalic and atramatic Neck:  No JVD.  Lungs: Clear bilaterally to auscultation and percussion. Heart: HRRR . Normal S1 and S2 without gallops or murmurs.  Abdomen: Bowel sounds are positive, abdomen soft and non-tender  Msk:  Back normal, normal gait. Normal strength and tone for age. Extremities: No clubbing, cyanosis or edema.   Neuro: Alert and oriented X 3. Psych:  Good affect, responds appropriately   LABS: Basic Metabolic Panel: Recent Labs    09/09/20 2355 09/10/20 0838 09/11/20 0546  NA  --  147* 144  K  --  2.9* 4.2  CL  --  104 102  CO2  --  29 30  GLUCOSE  --  112* 112*  BUN  --  12 14  CREATININE  --  0.72 0.86  CALCIUM  --  9.2 9.3  MG 2.1  --  2.2  PHOS  --   --  3.0   Liver Function Tests: No results for input(s): AST, ALT, ALKPHOS, BILITOT, PROT, ALBUMIN in the last 72 hours. No results for input(s): LIPASE, AMYLASE in the last 72 hours. CBC: Recent Labs    09/10/20 0838 09/11/20 0546  WBC 9.0 8.0  NEUTROABS  --  5.5  HGB 13.6 14.8  HCT 43.0 48.3*  MCV 92.7 93.1  PLT 334 357   Cardiac Enzymes: No results for input(s): CKTOTAL, CKMB, CKMBINDEX,  TROPONINI in the last 72 hours. BNP: Invalid input(s): POCBNP D-Dimer: No results for input(s): DDIMER in the last 72 hours. Hemoglobin A1C: No results for input(s): HGBA1C in the last 72 hours. Fasting Lipid Panel: No results for input(s): CHOL, HDL, LDLCALC, TRIG, CHOLHDL, LDLDIRECT in the last 72 hours. Thyroid Function Tests: No results for input(s): TSH, T4TOTAL, T3FREE, THYROIDAB in the last 72 hours.  Invalid input(s): FREET3 Anemia Panel: No results for input(s): VITAMINB12, FOLATE, FERRITIN, TIBC, IRON, RETICCTPCT in the last 72 hours.  DG Chest 1 View  Result Date: 09/10/2020 CLINICAL DATA:  87 year old female with a history pleural effusion EXAM: CHEST  1 VIEW COMPARISON:  09/09/2020 FINDINGS: Cardiomediastinal silhouette likely unchanged, partially obscured by overlying lung/pleural disease. Decreased opacification at the right lung base with improved clarity of the right hemidiaphragm status post thoracentesis. No pneumothorax. Opacity at the left lung base obscuring the left hemidiaphragm and the left heart border is unchanged. Coarsened interstitial markings bilaterally. No significant interlobular septal thickening. IMPRESSION: No complicating features status post right-sided thoracentesis. Persisting left-sided pleural effusion, unchanged. Electronically Signed   By: Corrie Mckusick D.O.   On: 09/10/2020 14:52  DG Chest Portable 1 View  Result Date: 09/09/2020 CLINICAL DATA:  Weakness EXAM: PORTABLE CHEST 1 VIEW COMPARISON:  08/07/2020, CT 03/28/2020 FINDINGS: Small-moderate right and moderate to large left pleural effusion, increased compared to prior. Cardiomegaly with vascular congestion and edema. Airspace disease at both bases. IMPRESSION: Cardiomegaly with vascular congestion and pulmonary edema. Moderate to large left and small moderate right pleural effusions with basilar airspace disease. Electronically Signed   By: Donavan Foil M.D.   On: 09/09/2020 21:57   US  THORACENTESIS ASP PLEURAL SPACE W/IMG GUIDE  Result Date: 09/10/2020 INDICATION: Patient with a history of congestive heart failure and recurrent pleural effusions. Interventional radiology asked to perform a therapeutic and diagnostic thoracentesis. EXAM: ULTRASOUND GUIDED THORACENTESIS MEDICATIONS: 1% lidocaine 10 mL COMPLICATIONS: None immediate. PROCEDURE: An ultrasound guided thoracentesis was thoroughly discussed with the patient and questions answered. The benefits, risks, alternatives and complications were also discussed. The patient understands and wishes to proceed with the procedure. Written consent was obtained. Ultrasound was performed to localize and mark an adequate pocket of fluid in the right chest. The area was then prepped and draped in the normal sterile fashion. 1% Lidocaine was used for local anesthesia. Under ultrasound guidance a 6 Fr Safe-T-Centesis catheter was introduced. Thoracentesis was performed. The catheter was removed and a dressing applied. FINDINGS: A total of approximately 750 mL of clear yellow fluid was removed. Samples were sent to the laboratory as requested by the clinical team. IMPRESSION: Successful ultrasound guided right thoracentesis yielding 750 mL of pleural fluid. Read by: Soyla Dryer, NP Electronically Signed   By: Corrie Mckusick D.O.   On: 09/10/2020 14:56     Echo moderately reduced left ventricular function EF 30 to 35%  TELEMETRY: Normal sinus rhythm rate around 65:  ASSESSMENT AND PLAN:  Active Problems:   Acute CHF (congestive heart failure) (HCC) Cardiomyopathy EF around 30 to 35% Bilateral pleural effusions Shortness of breath Hypertensive urgency improved Hyperlipidemia GERD . Plan Continue telemetry IV diuretic therapy for heart failure shortness of breath Anticoagulation with Xarelto Hopefully IR thoracentesis prior to discharge Continue aggressive hypertension management and control Continue heart failure therapy Agree with  physical therapy Agree with heart failure therapy Entresto spironolactone Lasix reported allergic to beta-blockers consider imdur and hydralazine Increase activity    [name] Kida, MD 09/11/2020 8:10 AM

## 2020-09-11 NOTE — TOC Progression Note (Signed)
Transition of Care [facility]) - Progression Note    Patient Details  Name: [name] [name] MRN: [id number] Date of Birth: 1929/07/29  Transition of Care [facility]) CM/SW [place], LCSW Phone Number: 09/11/2020, 12:33 PM  Clinical Narrative:   PT recommended home health services. Notified Jason with [facility]     Expected Discharge Plan: [place] Barriers to Discharge: Continued Medical Work up  Expected Discharge Plan and Services Expected Discharge Plan: [place] arrangements for the past 2 months: Single Family Home                                       Social Determinants of Health (SDOH) Interventions    Readmission Risk Interventions Readmission Risk Prevention Plan 09/11/2020  Transportation Screening Complete  PCP or Specialist Appt within 3-5 Days Complete  HRI or [place] Complete  Social Work Consult for [place] Planning/Counseling Complete  Palliative Care Screening Complete  Medication Review [employer]) Complete  Some recent data might be hidden

## 2020-09-12 ENCOUNTER — Inpatient Hospital Stay: Payer: Medicare Other

## 2020-09-12 LAB — URINE CULTURE: Culture: >=100000 — AB

## 2020-09-12 LAB — CBC WITH DIFFERENTIAL/PLATELET
Abs Immature Granulocytes: 0.04 10*3/uL (ref 0.00–0.07)
Basophils Absolute: 0.1 10*3/uL (ref 0.0–0.1)
Basophils Relative: 1 %
Eosinophils Absolute: 0.4 10*3/uL (ref 0.0–0.5)
Eosinophils Relative: 4 %
HCT: 46.6 % — ABNORMAL HIGH (ref 36.0–46.0)
Hemoglobin: 14.4 g/dL (ref 12.0–15.0)
Immature Granulocytes: 0 %
Lymphocytes Relative: 14 %
Lymphs Abs: 1.4 10*3/uL (ref 0.7–4.0)
MCH: 28.3 pg (ref 26.0–34.0)
MCHC: 30.9 g/dL (ref 30.0–36.0)
MCV: 91.6 fL (ref 80.0–100.0)
Monocytes Absolute: 1.5 10*3/uL — ABNORMAL HIGH (ref 0.1–1.0)
Monocytes Relative: 15 %
Neutro Abs: 6.7 10*3/uL (ref 1.7–7.7)
Neutrophils Relative %: 66 %
Platelets: 321 10*3/uL (ref 150–400)
RBC: 5.09 MIL/uL (ref 3.87–5.11)
RDW: 19.4 % — ABNORMAL HIGH (ref 11.5–15.5)
WBC: 10 10*3/uL (ref 4.0–10.5)
nRBC: 0 % (ref 0.0–0.2)

## 2020-09-12 LAB — BASIC METABOLIC PANEL
Anion gap: 11 (ref 5–15)
BUN: 23 mg/dL (ref 8–23)
CO2: 28 mmol/L (ref 22–32)
Calcium: 9 mg/dL (ref 8.9–10.3)
Chloride: 101 mmol/L (ref 98–111)
Creatinine, Ser: 0.94 mg/dL (ref 0.44–1.00)
GFR, Estimated: 58 mL/min — ABNORMAL LOW (ref >60)
Glucose, Bld: 124 mg/dL — ABNORMAL HIGH (ref 70–99)
Potassium: 3.9 mmol/L (ref 3.5–5.1)
Sodium: 140 mmol/L (ref 135–145)

## 2020-09-12 LAB — PROTEIN, BODY FLUID (OTHER): Total Protein, Body Fluid Other: 2.5 g/dL

## 2020-09-12 MED ORDER — CEPHALEXIN 500 MG PO CAPS: 500.0000 mg | ORAL_CAPSULE | Freq: Three times a day (TID) | ORAL | 0 refills | Status: AC

## 2020-09-12 MED ORDER — ENOXAPARIN SODIUM 30 MG/0.3ML IJ SOSY: 30.0000 mg | PREFILLED_SYRINGE | INTRAMUSCULAR | Status: DC

## 2020-09-12 MED ORDER — SPIRONOLACTONE 25 MG PO TABS: 12.5000 mg | ORAL_TABLET | Freq: Every day | ORAL | 2 refills | Status: DC

## 2020-09-12 MED ORDER — SACUBITRIL-VALSARTAN 24-26 MG PO TABS: 1.0000 | ORAL_TABLET | Freq: Two times a day (BID) | ORAL | 2 refills | Status: DC

## 2020-09-12 NOTE — Procedures (Signed)
PROCEDURE SUMMARY:  Successful US guided Left thoracentesis. Yielded 500 mLs of clear yellow fluid. Pt tolerated procedure well. No immediate complications.  Specimen was not sent for labs. CXR ordered.  EBL < 5 mL  Rockney Ghee 09/12/2020 3:04 PM

## 2020-09-12 NOTE — Discharge Summary (Signed)
Physician Discharge Summary  [name] [name] [id number] DOB: August 15, 1929 DOA: 09/09/2020  PCP: [name] Body, MD  Admit date: 09/09/2020 Discharge date: 09/12/2020  Admitted From: Home Discharge disposition: Home with home health PT RN   Code Status: Full Code   Discharge Diagnosis:   Active Problems:   Acute CHF (congestive heart failure) [facility])   Chief Complaint  Patient presents with   Weakness    Brief narrative: [name] [name] is a 87 y.o. female with PMH significant for HTN, HLD, chronic systolic CHF  (EF 30 to [id number]), GERD, anxiety. Patient presented to the ED on 8/12 with complaint of progressively worsening generalized weakness, dyspnea, orthopnea, PND for a week.  On the day of admission, she woke up gasping for air.  Also reported urinary frequency and urgency.  In the ED, heart rate 50, blood pressure 169/66, 92% oxygen saturation on room air Labs with potassium low at 2.6 EKG with normal sinus rhythm at 64. Chest x-ray showed cardiomegaly with vascular congestion and pulm edema with moderate to large bilateral pleural effusion, left more than right. Patient was given IV Lasix in the ED.   Admitted to hospitalist service for further evaluation management. See below for details  Subjective: Patient was seen and examined this morning.   Sitting up in chair.  Not in distress.  Breathing better.  Not on supplemental oxygen.    Hospital course: Acute exacerbation of chronic systolic CHF -Presented with worsening shortness of breath, orthopnea PND -Recent echo with EF 30 to 35%, severe left atrial dilatation. -Diuresed with Lasix 40 mg IV twice daily.  Also started on Entresto, Aldactone.  Not on beta-blocker because of reported allergy. -Net IO Since Admission: -6,238.25 mL [09/12/20 1627] -Cardiology consult appreciated.   Recent Labs  Lab 09/09/20 1739 09/09/20 2355 09/10/20 0838 09/11/20 0546 09/12/20 0513  BNP  --   --   --  [id number]*  --    BUN 14  --  '12 14 23  '$ CREATININE 0.70  --  0.72 0.86 0.94  K 2.6*  --  2.9* 4.2 3.9  MG 2.3 2.1  --  2.2  --    Hypertensive urgency -Blood pressure elevated at first.  Improved with initiation of heart failure regimen.  Bilateral moderate to large pleural effusion -8/13 underwent 750 mL of fluid removed by thoracentesis of the right side.  -8/15 underwent 500 mils of fluid removed from the left side.  Acute respiratory failure with hypoxia -Does not use oxygen at home.  Initially required 4 L oxygen by nasal cannula.  Currently not requiring supplemental oxygen at rest but oxygen saturation dropped down to 71% on ambulation.  Improved with supplemental oxygen.  Discharge home on home oxygen.  Paroxysmal A. Fib -It seems patient was not taking any AV nodal blocking agent at home. -Currently on sinus rhythm.  Patient was supposed to be Xarelto at home which apparently she is not taking.  Not sure if it was stopped as an outpatient because of fall risk.  Klebsiella UTI -Urine culture grew more than 100,000 CFU per mL of Klebsiella pneumoniae.  She was started on IV Rocephin.  Continue antibiotics with oral Keflex for next 3 days at home.  Probiotics along with.  Dyslipidemia -Continue statin  GERD -PPI, Iron supplement   Allergies as of 09/12/2020       Reactions   Metoprolol Swelling   Feet, ankles and legs    Bactrim [sulfamethoxazole-trimethoprim] Other (See Comments)   Hearing loss  Ciprofloxacin Other (See Comments)   Flu-like symptoms   Pneumococcal Vaccines Other (See Comments)   Flu-like symptoms        Medication List     STOP taking these medications    acetaminophen 500 MG tablet Commonly known as: TYLENOL   amiodarone 200 MG tablet Commonly known as: PACERONE   bisoprolol 5 MG tablet Commonly known as: ZEBETA   losartan 25 MG tablet Commonly known as: COZAAR   potassium chloride 20 MEQ packet Commonly known as: KLOR-CON   Rivaroxaban 15 MG  Tabs tablet Commonly known as: XARELTO       TAKE these medications    calcium carbonate 500 MG chewable tablet Commonly known as: TUMS - dosed in mg elemental calcium Chew 500 mg by mouth daily as needed for indigestion or heartburn.   cephALEXin 500 MG capsule Commonly known as: KEFLEX Take 1 capsule (500 mg total) by mouth 3 (three) times daily for 3 days.   fluticasone 50 MCG/ACT nasal spray Commonly known as: FLONASE Place 1 spray into both nostrils daily.   furosemide 40 MG tablet Commonly known as: LASIX Take 1 tablet (40 mg total) by mouth daily.   iron polysaccharides 150 MG capsule Commonly known as: NIFEREX Take 1 capsule (150 mg total) by mouth daily.   lovastatin 40 MG tablet Commonly known as: MEVACOR Take 40 mg by mouth every evening.   pantoprazole 40 MG tablet Commonly known as: PROTONIX Take 1 tablet (40 mg total) by mouth 2 (two) times daily before a meal. What changed:  when to take this reasons to take this   polyethylene glycol powder 17 GM/SCOOP powder Commonly known as: GLYCOLAX/MIRALAX Take 17 g by mouth 2 (two) times daily as needed for moderate constipation.   sacubitril-valsartan 24-26 MG Commonly known as: ENTRESTO Take 1 tablet by mouth 2 (two) times daily.   spironolactone 25 MG tablet Commonly known as: ALDACTONE Take 0.5 tablets (12.5 mg total) by mouth daily. Start taking on: September 13, 2020   SYSTANE OP Place 1 drop into both eyes in the morning and at bedtime.   tretinoin 0.05 % cream Commonly known as: RETIN-A Apply 1 application topically at bedtime.               Durable Medical Equipment  (From admission, onward)           Start     Ordered   09/12/20 1626  For home use only DME oxygen  Once       Question Answer Comment  Length of Need Lifetime   Mode or (Route) Nasal cannula   Frequency Continuous (stationary and portable oxygen unit needed)   Oxygen conserving device Yes   Oxygen delivery  system Gas      09/12/20 1625            Discharge Instructions:  Diet Recommendation:  Discharge Diet Orders (From admission, onward)     Start     Ordered   09/12/20 0000  Diet - low sodium heart healthy        09/12/20 1344              Follow with Primary MD [name] Body, MD in 7 days   Get CBC/BMP checked in next visit within 1 week by PCP or SNF MD ( we routinely change or add medications that can affect your baseline labs and fluid status, therefore we recommend that you get the mentioned basic workup next visit with your PCP,  your PCP may decide not to get them or add new tests based on their clinical decision)  On your next visit with your PCP, please Get Medicines reviewed and adjusted.  Please request your PCP  to go over all Hospital Tests and Procedure/Radiological results at the follow up, please get all Hospital records sent to your Prim MD by signing hospital release before you go home.  Activity: As tolerated with Full fall precautions use walker/cane & assistance as needed  For Heart failure patients - Check your Weight same time everyday, if you gain over 2 pounds, or you develop in leg swelling, experience more shortness of breath or chest pain, call your Primary MD immediately. Follow Cardiac Low Salt Diet and 1.5 lit/day fluid restriction.  If you have smoked or chewed Tobacco in the last 2 yrs please stop smoking, stop any regular Alcohol  and or any Recreational drug use.  If you experience worsening of your admission symptoms, develop shortness of breath, life threatening emergency, suicidal or homicidal thoughts you must seek medical attention immediately by calling 911 or calling your MD immediately  if symptoms less severe.  You Must read complete instructions/literature along with all the possible adverse reactions/side effects for all the Medicines you take and that have been prescribed to you. Take any new Medicines after you have  completely understood and accpet all the possible adverse reactions/side effects.   Do not drive, operate heavy machinery, perform activities at heights, swimming or participation in water activities or provide baby sitting services if your were admitted for syncope or siezures until you have seen by Primary MD or a Neurologist and advised to do so again.  Do not drive when taking Pain medications.  Do not take more than prescribed Pain, Sleep and Anxiety Medications  Wear Seat belts while driving.   Please note You were cared for by a hospitalist during your hospital stay. If you have any questions about your discharge medications or the care you received while you were in the hospital after you are discharged, you can call the unit and asked to speak with the hospitalist on call if the hospitalist that took care of you is not available. Once you are discharged, your primary care physician will handle any further medical issues. Please note that NO REFILLS for any discharge medications will be authorized once you are discharged, as it is imperative that you return to your primary care physician (or establish a relationship with a primary care physician if you do not have one) for your aftercare needs so that they can reassess your need for medications and monitor your lab values.    Follow ups:    Follow-up Information     [name] Body, MD Follow up on 09/15/2020.   Specialty: Family Medicine Why: @ 12:45pm Contact information: [place] [address] [phone]                 Wound care:   Incision (Closed) 11/16/15 Eye Left (Active)  Date First Assessed/Time First Assessed: 11/16/15 1056   Location: Eye  Location Orientation: Left    Assessments 11/16/2015 11:17 AM 11/16/2015 11:23 AM  Dressing Type Other (Comment) Other (Comment)  Drainage Amount None None     No Linked orders to display     Incision (Closed) 12/07/15 Eye Right (Active)   Date First Assessed/Time First Assessed: 12/07/15 1019   Location: Eye  Location Orientation: Right    Assessments 12/07/2015 10:17 AM 12/07/2015 10:27  AM  Dressing Type Other (Comment) Other (Comment)  Drainage Amount None None     No Linked orders to display     Wound / Incision (Open or Dehisced) 01/12/20 Non-pressure wound Pretibial Left (Active)  Date First Assessed/Time First Assessed: 01/12/20 0842   Wound Type: Non-pressure wound  Location: Pretibial  Location Orientation: Left  Present on Admission: Yes    Assessments 01/13/2020  7:00 PM 01/16/2020  1:00 PM  Dressing Type Abdominal pads Gauze (Comment)  Dressing Changed Changed Changed  Dressing Status -- None  Dressing Change Frequency Daily Daily  Site / Wound Assessment Clean;Pink;Red;Yellow Yellow  % Wound base Red or Granulating 100% 100%  Drainage Amount -- None  Drainage Description Serosanguineous Serous  Treatment -- Cleansed;Other (Comment)     No Linked orders to display     Wound / Incision (Open or Dehisced) 01/12/20 Non-pressure wound Pretibial Left;Distal (Active)  Date First Assessed/Time First Assessed: 01/12/20 0842   Wound Type: Non-pressure wound  Location: Pretibial  Location Orientation: Left;Distal  Present on Admission: Yes    Assessments 01/16/2020  1:00 AM 01/16/2020  7:42 AM  Dressing Type Gauze (Comment);Normal saline moist dressing;Other (Comment) --  Dressing Changed Changed --  Dressing Status Clean;Dry;Intact Clean;Dry;Intact  Dressing Change Frequency Daily --  Site / Wound Assessment Yellow;Pink --  Peri-wound Assessment Pink --  Drainage Amount Scant None  Drainage Description Serous --  Treatment Other (Comment);Cleansed;Packing (Saline gauze) --     No Linked orders to display     Wound / Incision (Open or Dehisced) 01/12/20 Non-pressure wound Pretibial Left;Distal;Lateral (Active)  Date First Assessed/Time First Assessed: 01/12/20 0843   Wound Type: Non-pressure wound  Location:  Pretibial  Location Orientation: Left;Distal;Lateral  Present on Admission: Yes    No assessment data to display     No Linked orders to display     Wound / Incision (Open or Dehisced) 01/12/20 Leg Left outer most wound  (Active)  Date First Assessed/Time First Assessed: 01/12/20 0938   Location: Leg  Location Orientation: Left  Wound Description (Comments): outer most wound   Present on Admission: Yes    Assessments 01/12/2020  9:48 AM 01/16/2020  7:42 AM  Dressing Type Other (Comment) --  Dressing Changed New --  Dressing Status Clean;Dry;Intact Clean;Dry;Intact  Site / Wound Assessment Clean;Yellow --  Wound Length (cm) 1 cm --  Wound Width (cm) 2 cm --  Wound Surface Area (cm^2) 2 cm^2 --  Drainage Amount None None  Treatment Cleansed --     No Linked orders to display     Wound / Incision (Open or Dehisced) 01/12/20 Other (Comment) Leg Left upper most wound (Active)  Date First Assessed/Time First Assessed: 01/12/20 0949   Wound Type: (c) Other (Comment)  Location: Leg  Location Orientation: Left  Wound Description (Comments): upper most wound  Present on Admission: Yes    Assessments 01/12/2020  9:49 AM 01/15/2020  7:58 AM  Dressing Type Impregnated gauze (bismuth) --  Dressing Changed New --  Dressing Status Clean;Dry;Intact Clean;Dry;Intact  Wound Length (cm) 2.5 cm --  Wound Width (cm) 2 cm --  Wound Surface Area (cm^2) 5 cm^2 --  Drainage Amount None None     No Linked orders to display     Wound / Incision (Open or Dehisced) 01/12/20 Other (Comment) Leg Left lower most wound  (Active)  Date First Assessed/Time First Assessed: 01/12/20 0950   Wound Type: Other (Comment)  Location: Leg  Location Orientation: Left  Wound  Description (Comments): lower most wound   Present on Admission: Yes    Assessments 01/12/2020  9:52 AM 01/15/2020  7:58 AM  Dressing Type Impregnated gauze (bismuth) --  Dressing Changed New --  Dressing Status Clean;Intact;Dry Clean;Dry;Intact   Wound Length (cm) 4 cm --  Wound Width (cm) 2 cm --  Wound Surface Area (cm^2) 8 cm^2 --  Drainage Amount -- None     No Linked orders to display     Incision (Closed) 08/04/20 Abdomen Other (Comment) (Active)  Date First Assessed/Time First Assessed: 08/04/20 1359   Location: Abdomen  Location Orientation: Other (Comment)    Assessments 08/04/2020  2:15 PM 08/15/2020  7:42 PM  Dressing Type Honeycomb Liquid skin adhesive  Dressing Clean;Dry;Intact Clean;Dry;Intact  Site / Wound Assessment Dressing in place / Unable to assess Clean;Dry  Margins -- Attached edges (approximated)  Closure -- Skin glue  Drainage Amount None None  Drainage Description No odor --  Treatment -- Other (Comment)     No Linked orders to display     Incision - 3 Ports Abdomen Left Left;Lower Right;Lower (Active)  Placement Date/Time: 08/04/20 1303   Location of Ports: Abdomen  Location Orientation: Left  Location Orientation: Left;Lower  Location Orientation: Right;Lower    Assessments 08/04/2020  2:02 PM 08/15/2020  7:42 PM  Port 1 Site Assessment -- [employer] 1 Margins -- Attached edges (approximated)  Port 1 Drainage Amount -- None  Port 1 Dressing Type Liquid skin adhesive Liquid skin adhesive  Port 1 Dressing Status -- Clean;Dry;Intact  Port 2 Site Assessment -- [employer] 2 Margins -- Attached edges (approximated)  Port 2 Drainage Amount -- None  Port 2 Dressing Type Liquid skin adhesive Liquid skin adhesive  Port 2 Dressing Status -- Clean;Intact;Dry  Port 3 Site Assessment -- [employer] 3 Margins -- Attached edges (approximated)  Port 3 Drainage Amount -- None  Port 3 Dressing Type Liquid skin adhesive Liquid skin adhesive  Port 3 Dressing Status -- Clean;Dry;Intact     No Linked orders to display     Pressure Injury 08/07/20 Coccyx Medial Stage 2 -  Partial thickness loss of dermis presenting as a shallow open injury with a red, pink wound bed without slough. Small pin sized open  area on sacrum (Active)  Date First Assessed/Time First Assessed: 08/07/20 0345   Location: Coccyx  Location Orientation: Medial  Staging: Stage 2 -  Partial thickness loss of dermis presenting as a shallow open injury with a red, pink wound bed without slough.  Wound Descrip...    Assessments 08/07/2020  3:45 AM 08/15/2020  7:42 PM  Dressing Type Foam - Lift dressing to assess site every shift --  Dressing Other (Comment) Clean;Dry;Intact  Dressing Change Frequency Every 3 days PRN  State of Healing Non-healing Early/partial granulation  Site / Wound Assessment Dry;Clean;Red Pink;Dry;Clean  Peri-wound Assessment -- Erythema (blanchable)  Wound Length (cm) 1 cm --  Wound Width (cm) 1 cm --  Wound Depth (cm) 1 cm --  Wound Surface Area (cm^2) 1 cm^2 --  Wound Volume (cm^3) 1 cm^3 --  Tunneling (cm) 0 --  Margins Unattached edges (unapproximated) Unattached edges (unapproximated)  Drainage Amount None None  Treatment Cleansed Off loading     No Linked orders to display     Pressure Injury 08/10/20 Buttocks Right;Left;Medial Stage 1 -  Intact skin with non-blanchable redness of a localized area usually over a bony prominence. nonblancable redness (Active)  Date First Assessed/Time First  Assessed: 08/10/20 0900   Location: (c) Buttocks  Location Orientation: Right;Left;Medial  Staging: Stage 1 -  Intact skin with non-blanchable redness of a localized area usually over a bony prominence.  Wound Descriptio...    Assessments 08/10/2020  9:00 AM 08/15/2020  7:42 PM  Dressing Type Foam - Lift dressing to assess site every shift Foam - Lift dressing to assess site every shift  Dressing Clean;Dry;Intact Clean;Dry  Dressing Change Frequency Every 3 days PRN  Site / Wound Assessment -- Pink;Red  Wound Length (cm) 10 cm --  Wound Width (cm) 9 cm --  Wound Depth (cm) 0 cm --  Wound Surface Area (cm^2) 90 cm^2 --  Wound Volume (cm^3) 0 cm^3 --  Margins -- Attached edges (approximated)  Drainage  Amount -- None  Treatment -- Off loading     No Linked orders to display    Discharge Exam:   Vitals:   09/12/20 0531 09/12/20 0747 09/12/20 1143 09/12/20 1449  BP:  (!) 150/82 135/71 131/61  Pulse:  72 75 79  Resp:  17 17   Temp:  97.8 F (36.6 C) 98.2 F (36.8 C)   TempSrc:      SpO2:  99% 95% 95%  Weight: 46.7 kg     Height:        [name] mass index is 18.24 kg/m.  General exam: Pleasant, elderly Caucasian female.  Sitting up in chair.  Not in distress Skin: No rashes, lesions or ulcers. HEENT: Atraumatic, normocephalic, no obvious bleeding Lungs: Clear to auscultation bilaterally CVS: Regular rate and rhythm, no murmur GI/Abd soft, nontender, nondistended, bowel sound present CNS: Alert, awake, oriented x3 Psychiatry: Mood appropriate Extremities: Pedal edema improved bilaterally  Time coordinating discharge: 35 minutes   The results of significant diagnostics from this hospitalization (including imaging, microbiology, ancillary and laboratory) are listed below for reference.    Procedures and Diagnostic Studies:   DG Chest 1 View  Result Date: 09/10/2020 CLINICAL DATA:  87 year old female with a history pleural effusion EXAM: CHEST  1 VIEW COMPARISON:  09/09/2020 FINDINGS: Cardiomediastinal silhouette likely unchanged, partially obscured by overlying lung/pleural disease. Decreased opacification at the right lung base with improved clarity of the right hemidiaphragm status post thoracentesis. No pneumothorax. Opacity at the left lung base obscuring the left hemidiaphragm and the left heart border is unchanged. Coarsened interstitial markings bilaterally. No significant interlobular septal thickening. IMPRESSION: No complicating features status post right-sided thoracentesis. Persisting left-sided pleural effusion, unchanged. Electronically Signed   By: Corrie Mckusick D.O.   On: 09/10/2020 14:52   DG Chest Portable 1 View  Result Date: 09/09/2020 CLINICAL DATA:   Weakness EXAM: PORTABLE CHEST 1 VIEW COMPARISON:  08/07/2020, CT 03/28/2020 FINDINGS: Small-moderate right and moderate to large left pleural effusion, increased compared to prior. Cardiomegaly with vascular congestion and edema. Airspace disease at both bases. IMPRESSION: Cardiomegaly with vascular congestion and pulmonary edema. Moderate to large left and small moderate right pleural effusions with basilar airspace disease. Electronically Signed   By: Donavan Foil M.D.   On: 09/09/2020 21:57   US THORACENTESIS ASP PLEURAL SPACE W/IMG GUIDE  Result Date: 09/10/2020 INDICATION: Patient with a history of congestive heart failure and recurrent pleural effusions. Interventional radiology asked to perform a therapeutic and diagnostic thoracentesis. EXAM: ULTRASOUND GUIDED THORACENTESIS MEDICATIONS: 1% lidocaine 10 mL COMPLICATIONS: None immediate. PROCEDURE: An ultrasound guided thoracentesis was thoroughly discussed with the patient and questions answered. The benefits, risks, alternatives and complications were also discussed. The patient understands and wishes  to proceed with the procedure. Written consent was obtained. Ultrasound was performed to localize and mark an adequate pocket of fluid in the right chest. The area was then prepped and draped in the normal sterile fashion. 1% Lidocaine was used for local anesthesia. Under ultrasound guidance a 6 Fr Safe-T-Centesis catheter was introduced. Thoracentesis was performed. The catheter was removed and a dressing applied. FINDINGS: A total of approximately 750 mL of clear yellow fluid was removed. Samples were sent to the laboratory as requested by the clinical team. IMPRESSION: Successful ultrasound guided right thoracentesis yielding 750 mL of pleural fluid. Read by: Soyla Dryer, NP Electronically Signed   By: Corrie Mckusick D.O.   On: 09/10/2020 14:56     Labs:   Basic Metabolic Panel: Recent Labs  Lab 09/09/20 1739 09/09/20 2355 09/10/20 0838  09/11/20 0546 09/12/20 0513  NA 141  --  147* 144 140  K 2.6*  --  2.9* 4.2 3.9  CL 100  --  104 102 101  CO2 26  --  '29 30 28  '$ GLUCOSE 73  --  112* 112* 124*  BUN 14  --  '12 14 23  '$ CREATININE 0.70  --  0.72 0.86 0.94  CALCIUM 9.0  --  9.2 9.3 9.0  MG 2.3 2.1  --  2.2  --   PHOS  --   --   --  3.0  --    GFR Estimated Creatinine Clearance: 29.9 mL/min (by C-G formula based on SCr of 0.94 mg/dL). Liver Function Tests: No results for input(s): AST, ALT, ALKPHOS, BILITOT, PROT, ALBUMIN in the last 168 hours. No results for input(s): LIPASE, AMYLASE in the last 168 hours. No results for input(s): AMMONIA in the last 168 hours. Coagulation profile Recent Labs  Lab 09/09/20 1739  INR 1.0    CBC: Recent Labs  Lab 09/10/20 0838 09/11/20 0546 09/12/20 0513  WBC 9.0 8.0 10.0  NEUTROABS  --  5.5 6.7  HGB 13.6 14.8 14.4  HCT 43.0 48.3* 46.6*  MCV 92.7 93.1 91.6  PLT 334 357 321   Cardiac Enzymes: No results for input(s): CKTOTAL, CKMB, CKMBINDEX, TROPONINI in the last 168 hours. BNP: Invalid input(s): POCBNP CBG: No results for input(s): GLUCAP in the last 168 hours. D-Dimer No results for input(s): DDIMER in the last 72 hours. Hgb A1c No results for input(s): HGBA1C in the last 72 hours. Lipid Profile No results for input(s): CHOL, HDL, LDLCALC, TRIG, CHOLHDL, LDLDIRECT in the last 72 hours. Thyroid function studies No results for input(s): TSH, T4TOTAL, T3FREE, THYROIDAB in the last 72 hours.  Invalid input(s): FREET3 Anemia work up No results for input(s): VITAMINB12, FOLATE, FERRITIN, TIBC, IRON, RETICCTPCT in the last 72 hours. Microbiology Recent Results (from the past 240 hour(s))  Urine Culture     Status: Abnormal   Collection Time: 09/09/20  5:39 PM   Specimen: Urine, Clean Catch  Result Value Ref Range Status   Specimen Description   Final    URINE, CLEAN CATCH Performed at [facility], [address], [place], [place] [address]    Special  Requests   Final    NONE Performed at [facility], [address] [place], [place] [address]    Culture >=100,000 COLONIES/mL KLEBSIELLA PNEUMONIAE (A)  Final   Report Status 09/12/2020 FINAL  Final   Organism ID, Bacteria KLEBSIELLA PNEUMONIAE (A)  Final      Susceptibility   Klebsiella pneumoniae - MIC*    AMPICILLIN >=32  RESISTANT Resistant     CEFAZOLIN <=4 SENSITIVE Sensitive     CEFEPIME <=0.12 SENSITIVE Sensitive     CEFTRIAXONE <=0.25 SENSITIVE Sensitive     CIPROFLOXACIN <=0.25 SENSITIVE Sensitive     GENTAMICIN <=1 SENSITIVE Sensitive     IMIPENEM 0.5 SENSITIVE Sensitive     NITROFURANTOIN 64 INTERMEDIATE Intermediate     TRIMETH/SULFA >=320 RESISTANT Resistant     AMPICILLIN/SULBACTAM 16 INTERMEDIATE Intermediate     PIP/TAZO <=4 SENSITIVE Sensitive     * >=100,000 COLONIES/mL KLEBSIELLA PNEUMONIAE  SARS CORONAVIRUS 2 (TAT 6-24 HRS) Nasopharyngeal Nasopharyngeal Swab     Status: None   Collection Time: 09/10/20  1:16 AM   Specimen: Nasopharyngeal Swab  Result Value Ref Range Status   SARS Coronavirus 2 NEGATIVE NEGATIVE Final    Comment: (NOTE) SARS-CoV-2 target nucleic acids are NOT DETECTED.  The SARS-CoV-2 RNA is generally detectable in upper and lower respiratory specimens during the acute phase of infection. Negative results do not preclude SARS-CoV-2 infection, do not rule out co-infections with other pathogens, and should not be used as the sole basis for treatment or other patient management decisions. Negative results must be combined with clinical observations, patient history, and epidemiological information. The expected result is Negative.  Fact Sheet for Patients: SugarRoll.be  Fact Sheet for Healthcare Providers: https://www.woods-mathews.com/  This test is not yet approved or cleared by the [place] FDA and  has been authorized for detection and/or diagnosis of SARS-CoV-2 by FDA under an  Emergency Use Authorization (EUA). This EUA will remain  in effect (meaning this test can be used) for the duration of the COVID-19 declaration under Se ction 564(b)(1) of the Act, 21 U.S.C. section 360bbb-3(b)(1), unless the authorization is terminated or revoked sooner.  Performed at [facility], [place] [address] [place], [place] [address]   [name] fluid culture w Gram Stain     Status: None (Preliminary result)   Collection Time: 09/10/20  2:15 PM   Specimen: PATH Cytology Pleural fluid  Result Value Ref Range Status   Specimen Description   Final    PLEURAL Performed at [facility], [address] [place], [place] [address]    Special Requests   Final    PLEURAL Performed at [facility], [address] [place], [place] [address]    Gram Stain   Final    WBC PRESENT, PREDOMINANTLY MONONUCLEAR NO ORGANISMS SEEN CYTOSPIN SMEAR    Culture   Final    NO GROWTH 2 DAYS Performed at [facility], [place] [address], [place], [place] [address]    Report Status PENDING  Incomplete     Signed: Marlowe Aschoff Jayle Solarz  [facility] 09/12/2020, 4:27 PM

## 2020-09-12 NOTE — TOC Transition Note (Signed)
Transition of Care [facility]) - CM/SW Discharge Note   Patient Details  Name: [name] [name] MRN: [id number] Date of Birth: 12/30/1929  [facility]) CM/SW Contact:  Alberteen Sam, LCSW Phone Number: 09/12/2020, 2:07 PM   Clinical Narrative:     Patient to discharge home with home health today, is active with [facility], requested PT and RN orders from MD.   No other discharge needs identified.   Final next level of care: [place] Barriers to Discharge: No Barriers Identified   Patient Goals and CMS Choice Patient states their goals for this hospitalization and ongoing recovery are:: to go home CMS Medicare.gov Compare Post Acute Care list provided to:: Patient Choice offered to / list presented to : Patient  Discharge Placement                       Discharge Plan and Services                          HH Arranged: PT, RN [facility] Agency: [place] ([facility]) Date [place]: 09/12/20 Time [place]: [phone] Representative spoke with at [place]: [place] ([place]) Interventions     Readmission Risk Interventions Readmission Risk Prevention Plan 09/11/2020  Transportation Screening Complete  PCP or Specialist Appt within 3-5 Days Complete  HRI or [place] Complete  Social Work Consult for [place] Planning/Counseling Complete  Palliative Care Screening Complete  Medication Review Press photographer) Complete  Some recent data might be hidden

## 2020-09-12 NOTE — Progress Notes (Signed)
SATURATION QUALIFICATIONS: (This note is used to comply with regulatory documentation for home oxygen)  Patient Saturations on Room Air at Rest = 94%  Patient Saturations on Room Air while Ambulating = 75%  Patient Saturations on 2 Liters of oxygen while Ambulating = 90%  Please briefly explain why patient needs home oxygen: Pt needs oxygen to ambulate within her home. Pt oxygen levels became dangerously low during ambulation

## 2020-09-12 NOTE — Progress Notes (Signed)
PHARMACIST - PHYSICIAN COMMUNICATION  CONCERNING:  Enoxaparin (Lovenox) for DVT Prophylaxis   DESCRIPTION: Patient was prescribed enoxaprin '40mg'$  q24 hours for VTE prophylaxis.   Filed Weights   09/09/20 1733 09/11/20 0700 09/12/20 0531  Weight: 50 kg (110 lb 3.7 oz) 50.6 kg (111 lb 8.8 oz) 46.7 kg (102 lb 15.3 oz)    Body mass index is 18.24 kg/m.  Estimated Creatinine Clearance: 29.9 mL/min (by C-G formula based on SCr of 0.94 mg/dL).  Patient is candidate for enoxaparin '30mg'$  every 24 hours based on CrCl <66m/min or Weight <45kg  RECOMMENDATION: Pharm[facility]  Patient is now receiving enoxaparin 30 mg every 24 hours    MDarnelle Bos PharmD Clinical Pharmacist  09/12/2020 9:24 AM

## 2020-09-12 NOTE — Progress Notes (Signed)
[facility] Cardiology    SUBJECTIVE: Patient states to be doing much better status post right thoracentesis breathing better.  No chest pain no cough   Vitals:   09/11/20 2017 09/11/20 2106 09/12/20 0531 09/12/20 0747  BP: 125/65 (!) 145/64  (!) 150/82  Pulse: 69 68  72  Resp: '18 16  17  '$ Temp: 98.2 F (36.8 C) 98.4 F (36.9 C)  97.8 F (36.6 C)  TempSrc:      SpO2: 95% 93%  99%  Weight:   46.7 kg   Height:         Intake/Output Summary (Last 24 hours) at 09/12/2020 0834 Last data filed at 09/12/2020 0600 Gross per 24 hour  Intake 938.03 ml  Output 2500 ml  Net -1561.97 ml      PHYSICAL EXAM  General: Well developed, well nourished, in no acute distress HEENT:  Normocephalic and atramatic Neck:  No JVD.  Lungs: Clear bilaterally to auscultation and percussion. Heart: HRRR . Normal S1 and S2 without gallops or murmurs.  Abdomen: Bowel sounds are positive, abdomen soft and non-tender  Msk:  Back normal, normal gait. Normal strength and tone for age. Extremities: No clubbing, cyanosis or edema.   Neuro: Alert and oriented X 3. Psych:  Good affect, responds appropriately   LABS: Basic Metabolic Panel: Recent Labs    09/09/20 2355 09/10/20 0838 09/11/20 0546 09/12/20 0513  NA  --    < > 144 140  K  --    < > 4.2 3.9  CL  --    < > 102 101  CO2  --    < > 30 28  GLUCOSE  --    < > 112* 124*  BUN  --    < > 14 23  CREATININE  --    < > 0.86 0.94  CALCIUM  --    < > 9.3 9.0  MG 2.1  --  2.2  --   PHOS  --   --  3.0  --    < > = values in this interval not displayed.   Liver Function Tests: No results for input(s): AST, ALT, ALKPHOS, BILITOT, PROT, ALBUMIN in the last 72 hours. No results for input(s): LIPASE, AMYLASE in the last 72 hours. CBC: Recent Labs    09/11/20 0546 09/12/20 0513  WBC 8.0 10.0  NEUTROABS 5.5 6.7  HGB 14.8 14.4  HCT 48.3* 46.6*  MCV 93.1 91.6  PLT 357 321   Cardiac Enzymes: No results for input(s): CKTOTAL, CKMB, CKMBINDEX,  TROPONINI in the last 72 hours. BNP: Invalid input(s): POCBNP D-Dimer: No results for input(s): DDIMER in the last 72 hours. Hemoglobin A1C: No results for input(s): HGBA1C in the last 72 hours. Fasting Lipid Panel: No results for input(s): CHOL, HDL, LDLCALC, TRIG, CHOLHDL, LDLDIRECT in the last 72 hours. Thyroid Function Tests: No results for input(s): TSH, T4TOTAL, T3FREE, THYROIDAB in the last 72 hours.  Invalid input(s): FREET3 Anemia Panel: No results for input(s): VITAMINB12, FOLATE, FERRITIN, TIBC, IRON, RETICCTPCT in the last 72 hours.  DG Chest 1 View  Result Date: 09/10/2020 CLINICAL DATA:  87 year old female with a history pleural effusion EXAM: CHEST  1 VIEW COMPARISON:  09/09/2020 FINDINGS: Cardiomediastinal silhouette likely unchanged, partially obscured by overlying lung/pleural disease. Decreased opacification at the right lung base with improved clarity of the right hemidiaphragm status post thoracentesis. No pneumothorax. Opacity at the left lung base obscuring the left hemidiaphragm and the left heart border is unchanged. Coarsened  interstitial markings bilaterally. No significant interlobular septal thickening. IMPRESSION: No complicating features status post right-sided thoracentesis. Persisting left-sided pleural effusion, unchanged. Electronically Signed   By: Corrie Mckusick D.O.   On: 09/10/2020 14:52   DG Chest 2 View  Result Date: 09/12/2020 CLINICAL DATA:  CHF exacerbation EXAM: CHEST - 2 VIEW COMPARISON:  September 10, 2020 FINDINGS: The cardiomediastinal silhouette is obscured by a moderate left pleural effusion. This appears slightly decreased in volume compared to couple days ago. Improved perihilar opacities. No right pleural effusion. No pneumothorax. No new consolidative opacity. No acute osseous abnormality. IMPRESSION: Improved interstitial opacities with similar to slightly improved moderate left pleural effusion. Electronically Signed   By: Albin Felling M.D.    On: 09/12/2020 08:11   US THORACENTESIS ASP PLEURAL SPACE W/IMG GUIDE  Result Date: 09/10/2020 INDICATION: Patient with a history of congestive heart failure and recurrent pleural effusions. Interventional radiology asked to perform a therapeutic and diagnostic thoracentesis. EXAM: ULTRASOUND GUIDED THORACENTESIS MEDICATIONS: 1% lidocaine 10 mL COMPLICATIONS: None immediate. PROCEDURE: An ultrasound guided thoracentesis was thoroughly discussed with the patient and questions answered. The benefits, risks, alternatives and complications were also discussed. The patient understands and wishes to proceed with the procedure. Written consent was obtained. Ultrasound was performed to localize and mark an adequate pocket of fluid in the right chest. The area was then prepped and draped in the normal sterile fashion. 1% Lidocaine was used for local anesthesia. Under ultrasound guidance a 6 Fr Safe-T-Centesis catheter was introduced. Thoracentesis was performed. The catheter was removed and a dressing applied. FINDINGS: A total of approximately 750 mL of clear yellow fluid was removed. Samples were sent to the laboratory as requested by the clinical team. IMPRESSION: Successful ultrasound guided right thoracentesis yielding 750 mL of pleural fluid. Read by: Soyla Dryer, NP Electronically Signed   By: Corrie Mckusick D.O.   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 Plan Continue telemetry Agree with continued IV Lasix therapy Continue heart failure  Entresto Aldactone for heart failure therapy Intolerant to beta-blockers Continue blood pressure management and control Increase activity     [name], MD 09/12/2020 8:34 AM

## 2020-09-13 LAB — PH, BODY FLUID: pH, Body Fluid: 7.6

## 2020-09-13 NOTE — TOC Progression Note (Signed)
Transition of Care [facility]) - Progression Note    Patient Details  Name: AUGUST LABRECK MRN: [id number] Date of Birth: 1929/12/04  Transition of Care [facility]) CM/SW [place], [place] Phone Number: 09/13/2020, 9:18 AM  Clinical Narrative:     CSW notes patient was found to need oxygen at discharge late yesterday evening.   CSW followed up with patient this morning, spoke with patient's husband on home phone who reports he does not believe patient has oxygen.   CSW reached out to [place] with [employer] to supply the 2L of home oxygen to patient as soon as possible.   Husband reports patient already has a walker at home, no other needs identified.     Expected Discharge Plan: [place] Barriers to Discharge: No Barriers Identified  Expected Discharge Plan and Services Expected Discharge Plan: [place] arrangements for the past 2 months: Single Family Home Expected Discharge Date: 09/12/20                         HH Arranged: PT, RN [place] Agency: [place] ([place]) Date HH Agency Contacted: 09/12/20 Time [place]: 1405 Representative spoke with at [place]: [place] ([place]) Interventions    Readmission Risk Interventions Readmission Risk Prevention Plan 09/11/2020  Transportation Screening Complete  PCP or Specialist Appt within 3-5 Days Complete  HRI or [place] Complete  Social Work Consult for [place] Planning/Counseling Complete  Palliative Care Screening Complete  Medication Review Press photographer) Complete  Some recent data might be hidden

## 2020-09-14 LAB — BODY FLUID CULTURE W GRAM STAIN: Culture: NO GROWTH

## 2020-09-22 NOTE — Progress Notes (Deleted)
   Patient ID: [name] [name], female    DOB: 1929-05-04, 87 y.o.   MRN: [id number]  HPI  [name] [name] is a 87 y/o female with a history of   Echo report from 08/08/20 reviewed and showed an EF of 30-35% along with severe LAE and moderate MR.   Admitted 09/09/20 due to   She presents today for her initial visit with a chief complaint of  Review of Systems    Physical Exam  Assessment & Plan:  1: Chronic heart failure with reduced ejection fraction- - NYHA class - BNP 09/11/20 was 1842.6  2: HTN- - BP - BMP 09/12/20 reviewed and showed sodium 140, potassium 3.9, creatinine 0.94 and GFR 58  3: Paroxsymal atrial fibrillation-

## 2020-09-23 ENCOUNTER — Ambulatory Visit: Payer: Medicare Other | Admitting: Family

## 2020-09-23 ENCOUNTER — Telehealth: Payer: Self-pay | Admitting: Family

## 2020-09-23 NOTE — Telephone Encounter (Signed)
Patient did not show for her [facility] Failure [facility] appointment on 09/23/20. Will attempt to reschedule.

## 2020-09-28 ENCOUNTER — Emergency Department: Payer: Medicare Other

## 2020-09-28 ENCOUNTER — Other Ambulatory Visit: Payer: Self-pay

## 2020-09-28 ENCOUNTER — Inpatient Hospital Stay
Admission: EM | Admit: 2020-09-28 | Discharge: 2020-10-04 | DRG: 308 | Disposition: A | Discharge status: Skilled Nursing Facility | Payer: Medicare Other | Attending: Internal Medicine | Admitting: Internal Medicine

## 2020-09-28 DIAGNOSIS — D509 Iron deficiency anemia, unspecified: Secondary | ICD-10-CM | POA: Diagnosis present

## 2020-09-28 DIAGNOSIS — E876 Hypokalemia: Secondary | ICD-10-CM | POA: Diagnosis present

## 2020-09-28 DIAGNOSIS — F0390 Unspecified dementia without behavioral disturbance: Secondary | ICD-10-CM | POA: Diagnosis present

## 2020-09-28 DIAGNOSIS — K219 Gastro-esophageal reflux disease without esophagitis: Secondary | ICD-10-CM | POA: Diagnosis present

## 2020-09-28 DIAGNOSIS — Z800 Family history of malignant neoplasm of digestive organs: Secondary | ICD-10-CM

## 2020-09-28 DIAGNOSIS — Z9049 Acquired absence of other specified parts of digestive tract: Secondary | ICD-10-CM | POA: Diagnosis not present

## 2020-09-28 DIAGNOSIS — S32020A Wedge compression fracture of second lumbar vertebra, initial encounter for closed fracture: Secondary | ICD-10-CM | POA: Diagnosis not present

## 2020-09-28 DIAGNOSIS — I10 Essential (primary) hypertension: Secondary | ICD-10-CM | POA: Diagnosis present

## 2020-09-28 DIAGNOSIS — Z888 Allergy status to other drugs, medicaments and biological substances status: Secondary | ICD-10-CM

## 2020-09-28 DIAGNOSIS — I5023 Acute on chronic systolic (congestive) heart failure: Secondary | ICD-10-CM | POA: Diagnosis present

## 2020-09-28 DIAGNOSIS — N390 Urinary tract infection, site not specified: Secondary | ICD-10-CM | POA: Diagnosis present

## 2020-09-28 DIAGNOSIS — B965 Pseudomonas (aeruginosa) (mallei) (pseudomallei) as the cause of diseases classified elsewhere: Secondary | ICD-10-CM | POA: Diagnosis present

## 2020-09-28 DIAGNOSIS — Z974 Presence of external hearing-aid: Secondary | ICD-10-CM

## 2020-09-28 DIAGNOSIS — Z85038 Personal history of other malignant neoplasm of large intestine: Secondary | ICD-10-CM

## 2020-09-28 DIAGNOSIS — I110 Hypertensive heart disease with heart failure: Secondary | ICD-10-CM | POA: Diagnosis present

## 2020-09-28 DIAGNOSIS — A419 Sepsis, unspecified organism: Secondary | ICD-10-CM

## 2020-09-28 DIAGNOSIS — K6289 Other specified diseases of anus and rectum: Secondary | ICD-10-CM | POA: Diagnosis present

## 2020-09-28 DIAGNOSIS — K5909 Other constipation: Secondary | ICD-10-CM | POA: Diagnosis present

## 2020-09-28 DIAGNOSIS — E785 Hyperlipidemia, unspecified: Secondary | ICD-10-CM | POA: Diagnosis present

## 2020-09-28 DIAGNOSIS — H9192 Unspecified hearing loss, left ear: Secondary | ICD-10-CM | POA: Diagnosis present

## 2020-09-28 DIAGNOSIS — I509 Heart failure, unspecified: Secondary | ICD-10-CM

## 2020-09-28 DIAGNOSIS — N3000 Acute cystitis without hematuria: Secondary | ICD-10-CM | POA: Diagnosis not present

## 2020-09-28 DIAGNOSIS — Z20822 Contact with and (suspected) exposure to covid-19: Secondary | ICD-10-CM | POA: Diagnosis present

## 2020-09-28 DIAGNOSIS — Z87891 Personal history of nicotine dependence: Secondary | ICD-10-CM

## 2020-09-28 DIAGNOSIS — Z515 Encounter for palliative care: Secondary | ICD-10-CM | POA: Diagnosis not present

## 2020-09-28 DIAGNOSIS — K769 Liver disease, unspecified: Secondary | ICD-10-CM | POA: Diagnosis present

## 2020-09-28 DIAGNOSIS — Z681 Body mass index (BMI) 19 or less, adult: Secondary | ICD-10-CM

## 2020-09-28 DIAGNOSIS — I480 Paroxysmal atrial fibrillation: Principal | ICD-10-CM | POA: Diagnosis present

## 2020-09-28 DIAGNOSIS — Z66 Do not resuscitate: Secondary | ICD-10-CM | POA: Diagnosis present

## 2020-09-28 DIAGNOSIS — F419 Anxiety disorder, unspecified: Secondary | ICD-10-CM | POA: Diagnosis present

## 2020-09-28 DIAGNOSIS — Z79899 Other long term (current) drug therapy: Secondary | ICD-10-CM

## 2020-09-28 DIAGNOSIS — L89152 Pressure ulcer of sacral region, stage 2: Secondary | ICD-10-CM

## 2020-09-28 DIAGNOSIS — Z85828 Personal history of other malignant neoplasm of skin: Secondary | ICD-10-CM

## 2020-09-28 DIAGNOSIS — I340 Nonrheumatic mitral (valve) insufficiency: Secondary | ICD-10-CM | POA: Diagnosis present

## 2020-09-28 DIAGNOSIS — E440 Moderate protein-calorie malnutrition: Secondary | ICD-10-CM | POA: Insufficient documentation

## 2020-09-28 DIAGNOSIS — I4891 Unspecified atrial fibrillation: Secondary | ICD-10-CM | POA: Diagnosis present

## 2020-09-28 DIAGNOSIS — Z887 Allergy status to serum and vaccine status: Secondary | ICD-10-CM

## 2020-09-28 DIAGNOSIS — M4856XA Collapsed vertebra, not elsewhere classified, lumbar region, initial encounter for fracture: Secondary | ICD-10-CM | POA: Diagnosis present

## 2020-09-28 DIAGNOSIS — C182 Malignant neoplasm of ascending colon: Secondary | ICD-10-CM | POA: Diagnosis not present

## 2020-09-28 LAB — CBC
HCT: 46.9 % — ABNORMAL HIGH (ref 36.0–46.0)
Hemoglobin: 15.2 g/dL — ABNORMAL HIGH (ref 12.0–15.0)
MCH: 29.3 pg (ref 26.0–34.0)
MCHC: 32.4 g/dL (ref 30.0–36.0)
MCV: 90.5 fL (ref 80.0–100.0)
Platelets: 366 10*3/uL (ref 150–400)
RBC: 5.18 MIL/uL — ABNORMAL HIGH (ref 3.87–5.11)
RDW: 18.8 % — ABNORMAL HIGH (ref 11.5–15.5)
WBC: 14.3 10*3/uL — ABNORMAL HIGH (ref 4.0–10.5)
nRBC: 0 % (ref 0.0–0.2)

## 2020-09-28 LAB — APTT: aPTT: 28 seconds (ref 24–36)

## 2020-09-28 LAB — MAGNESIUM: Magnesium: 2.2 mg/dL (ref 1.7–2.4)

## 2020-09-28 LAB — URINALYSIS, COMPLETE (UACMP) WITH MICROSCOPIC
Bacteria, UA: NONE SEEN
Bilirubin Urine: NEGATIVE
Glucose, UA: NEGATIVE mg/dL
Ketones, ur: NEGATIVE mg/dL
Nitrite: NEGATIVE
Protein, ur: NEGATIVE mg/dL
Specific Gravity, Urine: 1.012 (ref 1.005–1.030)
pH: 8 (ref 5.0–8.0)

## 2020-09-28 LAB — TROPONIN I (HIGH SENSITIVITY)
Troponin I (High Sensitivity): 11 ng/L
Troponin I (High Sensitivity): 11 ng/L (ref <18)

## 2020-09-28 LAB — BASIC METABOLIC PANEL WITH GFR
Anion gap: 12 (ref 5–15)
BUN: 17 mg/dL (ref 8–23)
CO2: 24 mmol/L (ref 22–32)
Calcium: 9.4 mg/dL (ref 8.9–10.3)
Chloride: 104 mmol/L (ref 98–111)
Creatinine, Ser: 0.78 mg/dL (ref 0.44–1.00)
GFR, Estimated: >60 mL/min
Glucose, Bld: 116 mg/dL — ABNORMAL HIGH (ref 70–99)
Potassium: 3.1 mmol/L — ABNORMAL LOW (ref 3.5–5.1)
Sodium: 140 mmol/L (ref 135–145)

## 2020-09-28 LAB — CK: Total CK: 22 U/L — ABNORMAL LOW (ref 38–234)

## 2020-09-28 LAB — LACTIC ACID, PLASMA: Lactic Acid, Venous: 1.7 mmol/L (ref 0.5–1.9)

## 2020-09-28 LAB — HEPATIC FUNCTION PANEL
ALT: 14 U/L (ref 0–44)
AST: 19 U/L (ref 15–41)
Albumin: 3.3 g/dL — ABNORMAL LOW (ref 3.5–5.0)
Alkaline Phosphatase: 94 U/L (ref 38–126)
Bilirubin, Direct: 0.1 mg/dL (ref 0.0–0.2)
Indirect Bilirubin: 0.8 mg/dL (ref 0.3–0.9)
Total Bilirubin: 0.9 mg/dL (ref 0.3–1.2)
Total Protein: 7.7 g/dL (ref 6.5–8.1)

## 2020-09-28 LAB — PHOSPHORUS: Phosphorus: 3.6 mg/dL (ref 2.5–4.6)

## 2020-09-28 LAB — BRAIN NATRIURETIC PEPTIDE: B Natriuretic Peptide: 1687.6 pg/mL — ABNORMAL HIGH (ref 0.0–100.0)

## 2020-09-28 LAB — RESP PANEL BY RT-PCR (FLU A&B, COVID) ARPGX2
Influenza A by PCR: NEGATIVE
Influenza B by PCR: NEGATIVE
SARS Coronavirus 2 by RT PCR: NEGATIVE

## 2020-09-28 LAB — PROTIME-INR
INR: 1.1 (ref 0.8–1.2)
Prothrombin Time: 13.7 seconds (ref 11.4–15.2)

## 2020-09-28 LAB — TSH: TSH: 1.657 u[IU]/mL (ref 0.350–4.500)

## 2020-09-28 LAB — PROCALCITONIN: Procalcitonin: <0.1 ng/mL

## 2020-09-28 MED ORDER — SODIUM CHLORIDE 0.9 % IV SOLN
1.0000 g | INTRAVENOUS | Status: DC
  Administered 2020-09-29: 1 g via INTRAVENOUS
  Filled 2020-09-28 (×2): qty 10

## 2020-09-28 MED ORDER — POTASSIUM CHLORIDE CRYS ER 20 MEQ PO TBCR
40.0000 meq | EXTENDED_RELEASE_TABLET | Freq: Once | ORAL | Status: AC
  Administered 2020-09-28: 40 meq via ORAL
  Filled 2020-09-28: qty 2

## 2020-09-28 MED ORDER — ACETAMINOPHEN 500 MG PO TABS
1000.0000 mg | ORAL_TABLET | Freq: Once | ORAL | Status: AC
  Administered 2020-09-28: 1000 mg via ORAL
  Filled 2020-09-28: qty 2

## 2020-09-28 MED ORDER — METRONIDAZOLE 500 MG/100ML IV SOLN
500.0000 mg | Freq: Three times a day (TID) | INTRAVENOUS | Status: DC
  Administered 2020-09-28 – 2020-09-30 (×6): 500 mg via INTRAVENOUS
  Filled 2020-09-28 (×8): qty 100

## 2020-09-28 MED ORDER — POLYETHYLENE GLYCOL 3350 17 G PO PACK
17.0000 g | PACK | Freq: Every day | ORAL | Status: DC
  Administered 2020-09-29 – 2020-10-04 (×4): 17 g via ORAL
  Filled 2020-09-28 (×6): qty 1

## 2020-09-28 MED ORDER — MILK AND MOLASSES ENEMA
1.0000 | Freq: Once | RECTAL | Status: DC
  Filled 2020-09-28: qty 240

## 2020-09-28 MED ORDER — LIDOCAINE 5 % EX PTCH
1.0000 | MEDICATED_PATCH | CUTANEOUS | Status: DC
  Administered 2020-09-29 – 2020-10-03 (×5): 1 via TRANSDERMAL
  Filled 2020-09-28 (×7): qty 1

## 2020-09-28 MED ORDER — FUROSEMIDE 10 MG/ML IJ SOLN
40.0000 mg | Freq: Once | INTRAMUSCULAR | Status: AC
  Administered 2020-09-28: 40 mg via INTRAVENOUS
  Filled 2020-09-28: qty 4

## 2020-09-28 MED ORDER — IOHEXOL 350 MG/ML SOLN
60.0000 mL | Freq: Once | INTRAVENOUS | Status: AC | PRN
  Administered 2020-09-28: 60 mL via INTRAVENOUS

## 2020-09-28 MED ORDER — DILTIAZEM LOAD VIA INFUSION
10.0000 mg | Freq: Once | INTRAVENOUS | Status: AC
  Administered 2020-09-28: 10 mg via INTRAVENOUS
  Filled 2020-09-28: qty 10

## 2020-09-28 MED ORDER — FUROSEMIDE 10 MG/ML IJ SOLN
40.0000 mg | Freq: Every day | INTRAMUSCULAR | Status: DC
  Administered 2020-09-29 – 2020-10-01 (×3): 40 mg via INTRAVENOUS
  Filled 2020-09-28 (×4): qty 4

## 2020-09-28 MED ORDER — SODIUM CHLORIDE 0.9 % IV SOLN
1.0000 g | Freq: Once | INTRAVENOUS | Status: AC
  Administered 2020-09-28: 1 g via INTRAVENOUS
  Filled 2020-09-28: qty 10

## 2020-09-28 MED ORDER — TRAMADOL HCL 50 MG PO TABS: 50.0000 mg | ORAL_TABLET | Freq: Four times a day (QID) | ORAL | Status: DC | PRN

## 2020-09-28 MED ORDER — POTASSIUM CHLORIDE 10 MEQ/100ML IV SOLN
10.0000 meq | INTRAVENOUS | Status: AC
  Administered 2020-09-28 (×4): 10 meq via INTRAVENOUS
  Filled 2020-09-28 (×2): qty 100

## 2020-09-28 MED ORDER — BISACODYL 10 MG RE SUPP
10.0000 mg | Freq: Every day | RECTAL | Status: DC | PRN
  Administered 2020-09-30: 10 mg via RECTAL
  Filled 2020-09-28 (×2): qty 1

## 2020-09-28 MED ORDER — DILTIAZEM HCL-DEXTROSE 125-5 MG/125ML-% IV SOLN (PREMIX)
5.0000 mg/h | INTRAVENOUS | Status: DC
  Administered 2020-09-28: 5 mg/h via INTRAVENOUS
  Administered 2020-09-28 – 2020-09-29 (×3): 15 mg/h via INTRAVENOUS
  Filled 2020-09-28 (×4): qty 125

## 2020-09-28 NOTE — ED Provider Notes (Addendum)
[facility] Emergency Department Provider Note  ____________________________________________   Event Date/Time   First MD Initiated Contact with Patient 09/28/20 1455     (approximate)  I have reviewed the triage vital signs and the nursing notes.   HISTORY  Chief Complaint Palpitations   HPI [name] [name] is a 87 y.o. female  with PMH significant for colon cancer status post colectomy not currently undergoing chemotherapy, HTN, HLD, chronic systolic CHF  (EF 30 to [id number]), GERD, anxiety and some mild dementia per son who she lives with who presents EMS from home for assessment of generally feeling poor with some palpitations and back pain.  Patient somewhat poor historian and thinks he may have fallen but is not exactly sure when.  She denies any chest pain, headache, shortness of breath, cough, fevers, nausea, vomiting, diarrhea, burning with urination, rash or acute extremity pain.  She is oriented to month year and location.  I was able to reach her son who states that he thinks she fell 5 or 6 days ago since then has been complaining of some low back pain.  He thinks that she has seemed weaker since that time otherwise has not been complaining of any other sick symptoms.  No other history is available on patient presentation.         Past Medical History:  Diagnosis Date   Anemia    Anxiety    Cancer ([place])    Squamous Cell Carcinoma (top of head)   Colon cancer ([place])    Constipation, chronic    Deaf, left    Diverticular disease    GERD (gastroesophageal reflux disease)    Hearing aid worn    Hearing deficit    History of Bell's palsy    left side - resolved - 50 yrs ago   Hypertension    Internal hemorrhoids    Iron deficiency anemia    Mitral regurgitation    moderate to severe on 03/27/20 echo   Osteopenia    Pure hypercholesterolemia     Patient Active Problem List   Diagnosis Date Noted   Acute CHF (congestive heart failure)  ([place]) 09/09/2020   Pressure injury of skin 08/07/2020   Confusion, postoperative 08/07/2020   Acute pulmonary edema ([place]) 08/07/2020   Diverticular disease 08/07/2020   GERD (gastroesophageal reflux disease) 08/07/2020   History of Bell's palsy 08/07/2020   Mitral regurgitation 08/07/2020   Cancer of ascending colon s/p lap colectomy 08/04/2020 06/01/2020   Colonic mass    Closed intertrochanteric fracture ([place]) 04/12/2020   Colles' fracture 04/12/2020   Osteoarthritis of knee 04/12/2020   Symptomatic anemia 03/25/2020   HLD (hyperlipidemia) 03/25/2020   Bilateral leg edema 02/04/2020   Rapid atrial fibrillation ([place]) 01/11/2020   Chronic anticoagulation 01/11/2020   Chronic ulcer of left leg ([place]) 01/11/2020   Iron deficiency anemia 01/11/2020   Hypokalemia 01/01/2020   Left leg cellulitis 01/01/2020   Iron deficiency anemia due to chronic blood loss 01/01/2020   Wound cellulitis 12/31/2019   Aortic atherosclerosis ([place]) 09/25/2019   HOH (hard of hearing) 08/31/2019   History of pelvic fracture 08/31/2019   Pelvic fracture ([place]) 08/25/2019   Permanent atrial fibrillation ([place]) 08/25/2019   Pain    Dehydration    Accidental fall 07/26/2019   UTI (urinary tract infection) 07/26/2019   Closed fracture of right inferior pubic ramus ([place]) 07/26/2019   HTN (hypertension) 07/26/2019   Ambulatory dysfunction 07/26/2019   Pubic bone fracture ([place]) 07/26/2019  History of colon polyps 06/25/2017   Constipation, chronic 07/27/2013   Osteopenia 07/27/2013    Past Surgical History:  Procedure Laterality Date   CATARACT EXTRACTION W/PHACO Left 11/16/2015   Procedure: CATARACT EXTRACTION PHACO AND INTRAOCULAR LENS PLACEMENT (IOC);  Surgeon: Leandrew Koyanagi, MD;  Location: [place];  Service: Ophthalmology;  Laterality: Left;  TORIC   CATARACT EXTRACTION W/PHACO Right 12/07/2015   Procedure: CATARACT EXTRACTION PHACO AND INTRAOCULAR LENS PLACEMENT (IOC);  Surgeon: Leandrew Koyanagi, MD;  Location: [place];  Service: Ophthalmology;  Laterality: Right;  TORIC   COLONOSCOPY WITH PROPOFOL N/A 08/10/2014   Procedure: COLONOSCOPY WITH PROPOFOL;  Surgeon: Lollie Sails, MD;  Location: [facility] ENDOSCOPY;  Service: Endoscopy;  Laterality: N/A;   COLONOSCOPY WITH PROPOFOL N/A 05/12/2020   Procedure: COLONOSCOPY WITH PROPOFOL;  Surgeon: Lin Landsman, MD;  Location[facility]COPY;  Service: Gastroenterology;  Laterality: N/A;   ESOPHAGOGASTRODUODENOSCOPY (EGD) WITH PROPOFOL N/A 03/28/2020   Procedure: ESOPHAGOGASTRODUODENOSCOPY (EGD) WITH PROPOFOL;  Surgeon: Lucilla Lame, MD;  Location: [facility] ENDOSCOPY;  Service: Endoscopy;  Laterality: N/A;   EYE SURGERY     FEMUR FRACTURE SURGERY     FRACTURE SURGERY     GIVENS CAPSULE STUDY N/A 04/19/2020   Procedure: GIVENS CAPSULE STUDY;  Surgeon: Lin Landsman, MD;  Location: [facility] ENDOSCOPY;  Service: Gastroenterology;  Laterality: N/A;   LAPAROSCOPIC RIGHT HEMI COLECTOMY Right 08/04/2020   Procedure: LAPAROSCOPIC RIGHT HEMI COLECTOMY;  Surgeon: [name], MD;  Location: [facility];  Service: General;  Laterality: Right;   Nasal Polyp Removal      Prior to Admission medications   Medication Sig Start Date End Date Taking? Authorizing Provider  calcium carbonate (TUMS - DOSED IN MG ELEMENTAL CALCIUM) 500 MG chewable tablet Chew 500 mg by mouth daily as needed for indigestion or heartburn.    [provider]  fluticasone (FLONASE) 50 MCG/ACT nasal spray Place 1 spray into both nostrils daily.    [provider]  furosemide (LASIX) 40 MG tablet Take 1 tablet (40 mg total) by mouth daily. 08/14/20   Mercy Riding, MD  iron polysaccharides (NIFEREX) 150 MG capsule Take 1 capsule (150 mg total) by mouth daily. 08/14/20   Mercy Riding, MD  lovastatin (MEVACOR) 40 MG tablet Take 40 mg by mouth every evening.    [provider]  pantoprazole (PROTONIX) 40 MG tablet Take 1 tablet (40 mg  total) by mouth 2 (two) times daily before a meal. Patient taking differently: Take 40 mg by mouth 2 (two) times daily as needed (acid reflux). 04/02/20 08/04/20  Enzo Bi, MD  Polyethyl Glycol-Propyl Glycol (SYSTANE OP) Place 1 drop into both eyes in the morning and at bedtime.    [provider]  polyethylene glycol powder (GLYCOLAX/MIRALAX) 17 GM/SCOOP powder Take 17 g by mouth 2 (two) times daily as needed for moderate constipation.    [provider]  sacubitril-valsartan (ENTRESTO) 24-26 MG Take 1 tablet by mouth 2 (two) times daily. 09/12/20 12/11/20  Terrilee Croak, MD  spironolactone (ALDACTONE) 25 MG tablet Take 0.5 tablets (12.5 mg total) by mouth daily. 09/13/20 12/12/20  Terrilee Croak, MD  tretinoin (RETIN-A) 0.05 % cream Apply 1 application topically at bedtime. 07/19/20   [provider]    Allergies Metoprolol, Bactrim [sulfamethoxazole-trimethoprim], Ciprofloxacin, and Pneumococcal vaccines  Family History  Problem Relation Age of Onset   Colon cancer Mother     Social History Social History   Tobacco Use   Smoking  status: Former    Types: Cigarettes    Quit date: 10/30/1985    Years since quitting: 34.9   Smokeless tobacco: Never  Vaping Use   Vaping Use: Never used  Substance Use Topics   Alcohol use: No   Drug use: No    Review of Systems  Review of Systems  Constitutional:  Positive for malaise/fatigue. Negative for chills and fever.  HENT:  Negative for sore throat.   Eyes:  Negative for pain.  Respiratory:  Negative for cough and stridor.   Cardiovascular:  Positive for palpitations. Negative for chest pain.  Gastrointestinal:  Negative for vomiting.  Genitourinary:  Negative for dysuria.  Musculoskeletal:  Positive for back pain.  Skin:  Negative for rash.  Neurological:  Positive for weakness. Negative for seizures, loss of consciousness and headaches.  Psychiatric/Behavioral:  Negative for suicidal ideas.   All other systems  reviewed and are negative.   ____________________________________________   PHYSICAL EXAM:  VITAL SIGNS: ED Triage Vitals  Enc Vitals Group     BP 09/28/20 1423 (!) 167/103     Pulse Rate 09/28/20 1422 (!) 134     Resp 09/28/20 1422 18     Temp 09/28/20 1422 98.5 F (36.9 C)     Temp Source 09/28/20 1422 Oral     SpO2 09/28/20 1422 94 %     Weight 09/28/20 1419 103 lb 9.9 oz (47 kg)     Height 09/28/20 1419 '5\' 3"'$  (1.6 m)     Head Circumference --      Peak Flow --      Pain Score 09/28/20 1419 3     Pain Loc --      Pain Edu? --      Excl. in [place]? --    Vitals:   09/28/20 1746 09/28/20 1807  BP: (!) 137/101 (!) 137/100  Pulse: (!) 106 (!) 112  Resp: 18 16  Temp:    SpO2: 94% 99%   Physical Exam Vitals and nursing note reviewed.  Constitutional:      General: She is not in acute distress.    Appearance: She is well-developed.  HENT:     Head: Normocephalic and atraumatic.     Right Ear: External ear normal.     Left Ear: External ear normal.     Nose: Nose normal.  Eyes:     Conjunctiva/sclera: Conjunctivae normal.  Cardiovascular:     Rate and Rhythm: Tachycardia present. Rhythm irregular.     Heart sounds: No murmur heard. Pulmonary:     Effort: Pulmonary effort is normal. No respiratory distress.     Breath sounds: Normal breath sounds.  Abdominal:     Palpations: Abdomen is soft.     Tenderness: There is no abdominal tenderness.  Musculoskeletal:     Cervical back: Neck supple.     Right lower leg: No edema.     Left lower leg: No edema.  Skin:    General: Skin is warm and dry.  Neurological:     Mental Status: She is alert.  Psychiatric:        Mood and Affect: Mood is anxious.    Patient has some tenderness over her L-spine without any tenderness over her C or T-spine.  She has symmetric grip strength in both upper extremities and is able to move her bilateral lower extremities with symmetric strength.  There is no obvious trauma to the face scalp  head or neck.  Cranial nerves II to  XII are grossly intact.  Abdomen is soft throughout. ____________________________________________   LABS (all labs ordered are listed, but only abnormal results are displayed)  Labs Reviewed  BASIC METABOLIC PANEL - Abnormal; Notable for the following components:      Result Value   Potassium 3.1 (*)    Glucose, Bld 116 (*)    All other components within normal limits  CBC - Abnormal; Notable for the following components:   WBC 14.3 (*)    RBC 5.18 (*)    Hemoglobin 15.2 (*)    HCT 46.9 (*)    RDW 18.8 (*)    All other components within normal limits  BRAIN NATRIURETIC PEPTIDE - Abnormal; Notable for the following components:   B Natriuretic Peptide 1,687.6 (*)    All other components within normal limits  URINALYSIS, COMPLETE (UACMP) WITH MICROSCOPIC - Abnormal; Notable for the following components:   Color, Urine COLORLESS (*)    APPearance CLEAR (*)    Hgb urine dipstick MODERATE (*)    Leukocytes,Ua LARGE (*)    All other components within normal limits  HEPATIC FUNCTION PANEL - Abnormal; Notable for the following components:   Albumin 3.3 (*)    All other components within normal limits  RESP PANEL BY RT-PCR (FLU A&B, COVID) ARPGX2  CULTURE, BLOOD (SINGLE)  URINE CULTURE  PROTIME-INR  MAGNESIUM  TSH  PROCALCITONIN  LACTIC ACID, PLASMA  APTT  TROPONIN I (HIGH SENSITIVITY)  TROPONIN I (HIGH SENSITIVITY)   ____________________________________________  EKG  A. fib with a ventricular rate of 141 and some nonspecific ST changes in inferior and anterior leads as well as lateral leads.  QTc interval is 539. ____________________________________________  RADIOLOGY  ED MD interpretation:  CT of the abdomen obtained shows remarkable for some bilateral pleural effusions with compressive atelectasis.  There is a 1.9 cm density in the right hepatic lobe not previously seen concerning for possible metastatic disease given history of colon  cancer.  There is also some circumferential wall thickening around the lowest rectum possibly representing some proctitis.  Imaging of the L-spine shows L2 compression fracture and some moderate to large stool in throughout the colon.  There is also diffuse urinary ball bladder thickening without evidence of perinephric stranding or kidney stone.  CT head and C-spine showed no evidence of skull fracture, intracranial hemorrhage, subacute CVA or acute C-spine injury.  There is some prominence of lateral ventricles possibly related to atrophy versus component of normal pressure hydrocephalus.  Chest x-ray has no focal consolidation, effusion, pneumothorax or rib fracture but there is evidence of bilateral pulm edema  Official radiology report(s): CT HEAD WO CONTRAST (5MM)  Result Date: 09/28/2020 CLINICAL DATA:  Atrial fibrillation. Taken off blood thinners due to operation. Back pain and generalized weakness for couple days. Tachycardic to 140. EXAM: CT HEAD WITHOUT CONTRAST CT CERVICAL SPINE WITHOUT CONTRAST TECHNIQUE: Multidetector CT imaging of the head and cervical spine was performed following the standard protocol without intravenous contrast. Multiplanar CT image reconstructions of the cervical spine were also generated. COMPARISON:  CT chest 03/28/2020. FINDINGS: CT HEAD FINDINGS BRAIN: BRAIN Prominence of the lateral ventricles may be related to central predominant atrophy, although a component of normal pressure/communicating hydrocephalus cannot be excluded. Patchy and confluent areas of decreased attenuation are noted throughout the deep and periventricular white matter of the cerebral hemispheres bilaterally, compatible with chronic microvascular ischemic disease. No evidence of large-territorial acute infarction. No parenchymal hemorrhage. No mass lesion. No extra-axial collection. No mass effect or  midline shift. No hydrocephalus. Basilar cisterns are patent. Vascular: No hyperdense vessel.  Atherosclerotic calcifications are present within the cavernous internal carotid arteries. Skull: No acute fracture or focal lesion. Sinuses/Orbits: Paranasal sinuses and mastoid air cells are clear. Bilateral lens replacement. Otherwise orbits are unremarkable. Other: None. CT CERVICAL SPINE FINDINGS Alignment: Normal. Skull base and vertebrae: Amorphous calcification surrounding the C7 and T1 spinous processes (6:35) of unclear etiology but similar to prior CT chest. No acute fracture. No aggressive appearing focal osseous lesion or focal pathologic process. Soft tissues and spinal canal: No prevertebral fluid or swelling. No visible canal hematoma. Upper chest: Unremarkable. Other: A 0.8 cm hypodensity within the left thyroid gland. Not clinically significant; no follow-up imaging recommended (ref: J Am Coll Radiol. 2015 Feb;12(2): 143-50). IMPRESSION: 1. No acute intracranial abnormality. 2. Prominence of the lateral ventricles may be related to central predominant atrophy, although a component of normal pressure/communicating hydrocephalus cannot be excluded. 3. No acute displaced fracture or traumatic listhesis of the cervical spine. Electronically Signed   By: Iven Finn M.D.   On: 09/28/2020 17:58   CT Cervical Spine Wo Contrast  Result Date: 09/28/2020 CLINICAL DATA:  Atrial fibrillation. Taken off blood thinners due to operation. Back pain and generalized weakness for couple days. Tachycardic to 140. EXAM: CT HEAD WITHOUT CONTRAST CT CERVICAL SPINE WITHOUT CONTRAST TECHNIQUE: Multidetector CT imaging of the head and cervical spine was performed following the standard protocol without intravenous contrast. Multiplanar CT image reconstructions of the cervical spine were also generated. COMPARISON:  CT chest 03/28/2020. FINDINGS: CT HEAD FINDINGS BRAIN: BRAIN Prominence of the lateral ventricles may be related to central predominant atrophy, although a component of normal pressure/communicating  hydrocephalus cannot be excluded. Patchy and confluent areas of decreased attenuation are noted throughout the deep and periventricular white matter of the cerebral hemispheres bilaterally, compatible with chronic microvascular ischemic disease. No evidence of large-territorial acute infarction. No parenchymal hemorrhage. No mass lesion. No extra-axial collection. No mass effect or midline shift. No hydrocephalus. Basilar cisterns are patent. Vascular: No hyperdense vessel. Atherosclerotic calcifications are present within the cavernous internal carotid arteries. Skull: No acute fracture or focal lesion. Sinuses/Orbits: Paranasal sinuses and mastoid air cells are clear. Bilateral lens replacement. Otherwise orbits are unremarkable. Other: None. CT CERVICAL SPINE FINDINGS Alignment: Normal. Skull base and vertebrae: Amorphous calcification surrounding the C7 and T1 spinous processes (6:35) of unclear etiology but similar to prior CT chest. No acute fracture. No aggressive appearing focal osseous lesion or focal pathologic process. Soft tissues and spinal canal: No prevertebral fluid or swelling. No visible canal hematoma. Upper chest: Unremarkable. Other: A 0.8 cm hypodensity within the left thyroid gland. Not clinically significant; no follow-up imaging recommended (ref: J Am Coll Radiol. 2015 Feb;12(2): 143-50). IMPRESSION: 1. No acute intracranial abnormality. 2. Prominence of the lateral ventricles may be related to central predominant atrophy, although a component of normal pressure/communicating hydrocephalus cannot be excluded. 3. No acute displaced fracture or traumatic listhesis of the cervical spine. Electronically Signed   By: Iven Finn M.D.   On: 09/28/2020 17:58   CT ABDOMEN PELVIS W CONTRAST  Result Date: 09/28/2020 CLINICAL DATA:  Abdominal pain, acute, nonlocalized. History of right hemicolectomy in July of 2022 for colon cancer EXAM: CT ABDOMEN AND PELVIS WITH CONTRAST TECHNIQUE:  Multidetector CT imaging of the abdomen and pelvis was performed using the standard protocol following bolus administration of intravenous contrast. CONTRAST:  51m OMNIPAQUE IOHEXOL 350 MG/ML SOLN COMPARISON:  06/17/2020. FINDINGS: Lower chest: Moderate  bilateral pleural effusions with associated compressive atelectasis of the bilateral lower lobes. Cardiomegaly. Coronary artery atherosclerosis. Hepatobiliary: 1.9 cm indeterminate density lesion within the right hepatic lobe not definitively seen on the previous study (series 2, image 18) numerous hepatic cysts are similar the prior study. Suspected gallbladder sludge. No hyperdense gallstone. No pericholecystic inflammatory changes by CT. No biliary dilatation. Pancreas: Unremarkable. No pancreatic ductal dilatation or surrounding inflammatory changes. Spleen: Normal in size without focal abnormality. Adrenals/Urinary Tract: Unremarkable adrenal glands. Kidneys enhance symmetrically. No renal stone or hydronephrosis. Mild diffuse urinary bladder wall thickening. Small bladder diverticula. Stomach/Bowel: Small hiatal hernia. Stomach appears otherwise within normal limits. No dilated loops of bowel. Interval postsurgical changes of a right hemicolectomy. Circumferential wall thickening within the low rectum. Scattered colonic diverticulosis. Moderate-large volume of stool throughout the colon. Vascular/Lymphatic: Extensive atherosclerosis throughout the aortoiliac axis. No abdominopelvic lymphadenopathy. Reproductive: Uterus and bilateral adnexa are unremarkable. Other: No free fluid. No abdominopelvic fluid collection. No pneumoperitoneum. No abdominal wall hernia. Musculoskeletal: Prior right hip ORIF. Healed right inferior pubic ramus fracture. There is a subtle superior endplate compression fracture of L2 which is new from the previous CT with less than 10% vertebral body height loss. IMPRESSION: 1. Moderate bilateral pleural effusions with associated  compressive atelectasis. 2. Indeterminate 1.9 cm density lesion within the right hepatic lobe not definitively seen on the previous study. Findings are concerning for hepatic metastatic disease given history of colon cancer. Further evaluation with non-emergent MRI of the abdomen is recommended. 3. Circumferential wall thickening within the low rectum may represent proctitis. 4. New subtle superior endplate compression fracture of L2 with less than 10% vertebral body height loss. 5. Moderate-large volume of stool throughout the colon. Interval right hemicolectomy. 6. Mild diffuse urinary bladder wall thickening with small bladder diverticula. Correlate with urinalysis to exclude cystitis. Aortic Atherosclerosis (ICD10-I70.0). Electronically Signed   By: Davina Poke D.O.   On: 09/28/2020 16:57   DG Chest Port 1 View  Result Date: 09/28/2020 CLINICAL DATA:  Atrial fibrillation. Recently taken off of blood thinners. Back pain and generalized pain. Tachycardic to 140. EXAM: PORTABLE CHEST 1 VIEW COMPARISON:  Chest x-ray 09/12/2020, CT chest 03/28/2020 FINDINGS: The heart and mediastinal contours are unchanged. Aortic calcification. No focal consolidation. Increased interstitial markings. Interval increase in size of bilateral small pleural effusions, left greater right. No pneumothorax. No acute osseous abnormality. IMPRESSION: Pulmonary edema with interval increase in size of bilateral small pleural effusions, left greater right. Electronically Signed   By: Iven Finn M.D.   On: 09/28/2020 15:21    ____________________________________________   PROCEDURES  Procedure(s) performed (including Critical Care):  .Critical Care  Date/Time: 09/28/2020 6:09 PM Performed by: Lucrezia Starch, MD Authorized by: Lucrezia Starch, MD   Critical care provider statement:    Critical care time (minutes):  45   Critical care was necessary to treat or prevent imminent or life-threatening deterioration of  the following conditions:  Cardiac failure   Critical care was time spent personally by me on the following activities:  Discussions with consultants, evaluation of patient's response to treatment, examination of patient, ordering and performing treatments and interventions, ordering and review of laboratory studies, ordering and review of radiographic studies, pulse oximetry, re-evaluation of patient's condition, obtaining history from patient or surrogate and review of old charts   ____________________________________________   INITIAL IMPRESSION / [place] / ED COURSE      Patient presents with above to history exam for assessment of some generalized  weakness palpitations and some back pain after recent fall that her son he thinks happened 5 or 6 days ago but did not actually witness.  Patient is not currently on a blood thinner and she is unable to recall the details of the fall.  She denies any complaints other than the pain and just generally feeling weak and bad.  On arrival she is tachycardic with a rate of 134 with otherwise stable vital signs on liters nasal cannula which she is on at home.  She is tender in her L-spine but does not seem to have focal deficits and is otherwise oriented x3.  Initial differential is quite broad and includes possible injuries from recent fall, symptomatic A. fib with rapid ventricular response, anemia, metabolic derangements and acute infectious process.  It seems patient is at her neurological baseline and I have a low suspicion for acute stroke.  Given history of recent fall will obtain a CT head and C-spine given patient's age and baseline mild dementia.  CT head and C-spine showed no evidence of acute intracranial hemorrhage or skull fracture or acute C-spine injury.  There is some mild ventriculomegaly concerning for possible early NPH although I do not believe this is acute.  ECG shows A. fib with RVR with some nonspecific changes.  Troponin  is nonelevated at 11 and given she is denying any chest pain I have a low suspicion for ACS or myocarditis.  Patient does not have significant edema on exam she does have pulm edema on her chest x-ray which otherwise does not show any rib fractures, pneumothorax or focal consolidations to suggest acute bacterial infection.  CBC shows leukocytosis with WBC count of 14.3.  Hemoglobin is 15.2.  Platelets are normal.  Magnesium is WNL.  Sh is WNL.  Procalcitonin is undetectable.  BNP is elevated at 1687 compared to 1242 2 weeks ago given evidence of volume overload on chest x-ray I am concerned that there is an element of volume overload contributing to patient's RVR today.  We will give a dose of Lasix in addition to rate controlling with diltiazem.  Suspect leukocytosis may be related to urinary tract infection given UA is concerning for cystitis with moderate hemoglobin and large leukocyte esterase with 21-50 WBCs.  Will obtain a urine and blood culture as well as lactic acid.  Lactic acid is nonelevated at 1.7.  Will defer fluid resuscitation given concern for mild volume overload at this time.  Hepatic function panel is unremarkable.   CT of the abdomen obtained shows remarkable for some bilateral pleural effusions with compressive atelectasis.  There is a 1.9 cm density in the right hepatic lobe not previously seen concerning for possible metastatic disease given history of colon cancer.  There is also some circumferential wall thickening around the lowest rectum possibly representing some proctitis.  Imaging of the L-spine shows L2 compression fracture and some moderate to large stool in throughout the colon.  There is also diffuse urinary ball bladder thickening without evidence of perinephric stranding or kidney stone.  Admit to medicine service for further evaluation and management.      ____________________________________________   FINAL CLINICAL IMPRESSION(S) / ED DIAGNOSES  Final  diagnoses:  Atrial fibrillation with RVR ([place])  Acute cystitis without hematuria  Compression fracture of L2 vertebra, initial encounter ([place])  Hypokalemia  Acute on chronic congestive heart failure, unspecified heart failure type (HCC)    Medications  diltiazem (CARDIZEM) 1 mg/mL load via infusion 10 mg (10 mg Intravenous Bolus  from Bag 09/28/20 1539)    And  diltiazem (CARDIZEM) 125 mg in dextrose 5% 125 mL (1 mg/mL) infusion (15 mg/hr Intravenous Rate/Dose Change 09/28/20 1808)  potassium chloride 10 mEq in 100 mL IVPB (10 mEq Intravenous New Bag/Given 09/28/20 1807)  lidocaine (LIDODERM) 5 % 1 patch (has no administration in time range)  potassium chloride SA (KLOR-CON) CR tablet 40 mEq (40 mEq Oral Given 09/28/20 1540)  furosemide (LASIX) injection 40 mg (40 mg Intravenous Given 09/28/20 1558)  iohexol (OMNIPAQUE) 350 MG/ML injection 60 mL (60 mLs Intravenous Contrast Given 09/28/20 1616)  cefTRIAXone (ROCEPHIN) 1 g in sodium chloride 0.9 % 100 mL IVPB (0 g Intravenous Stopped 09/28/20 1807)  acetaminophen (TYLENOL) tablet 1,000 mg (1,000 mg Oral Given 09/28/20 1730)     ED Discharge Orders     None        Note:  This document was prepared using Dragon voice recognition software and may include unintentional dictation errors.    Lucrezia Starch, MD 09/28/20 1812    Lucrezia Starch, MD 09/28/20 [phone]

## 2020-09-28 NOTE — H&P (Addendum)
[name] [name] EHO:[id number] DOB: December 01, 1929 DOA: 09/28/2020     PCP: [name], MD   Outpatient Specialists:  CARDS:  Alisa Graff, FNP Nehemiah Massed  Oncology: Janese Banks  Patient arrived to ER on 09/28/20 at 1353 Referred by Attending Lucrezia Starch, MD   Patient coming from: home Lives With family    Chief Complaint:   Chief Complaint  Patient presents with   Palpitations    HPI: [name] [name] is a 87 y.o. female with medical history significant of  colon cancer status post colectomy not currently undergoing chemotherapy, HTN, HLD, chronic systolic CHF  (EF 30 to 37%), GERD, anxiety and some mild dementia     Presented with   palpitations and back pain no CP no SOB ,no cough or fever, no N/v/D  son who states that he thinks she fell 5 or 6 days ago  Pt reports significant constipation  Son states he tried to help his mom's pain with some pain medication he had at home.  It made her feel very woozy and she did not like it at all.  Advised son to never administer medications not prescribed for the patient as this could lead to severe side effects and/or death.  Son voiced understanding  Recently been admitted for CHF exacerbation diuresed discharged home on 15 August.  During admission started on Entresto and Aldactone could not tolerate beta-blocker given a reported allergy.  Was seen in consult by cardiology. Patient is supposed to be on Xarelto in the past but stopped taking it possibly secondary to fall risk. Recently diagnosed with Klebsiella UTI treated with IV Rocephin and completed course with Keflex   Was discharged home on oxygen  Has  been vaccinated against COVID and boosted   Initial COVID TEST  NEGATIVE   Lab Results  Component Value Date   [place] 09/28/2020   [place] NEGATIVE 09/10/2020   [place] NEGATIVE 08/16/2020   [place] NEGATIVE 08/02/2020     Regarding pertinent Chronic problems:     Hyperlipidemia  - on statins Lovastatin Lipid Panel     Component Value Date/Time   CHOL 107 01/12/2020 0441   TRIG 84 01/12/2020 0441   HDL 46 01/12/2020 0441   CHOLHDL 2.3 01/12/2020 0441   VLDL 17 01/12/2020 0441   LDLCALC 44 01/12/2020 0441    HTN on  lasix   chronic CHF  systolic/ - last echo July 2022, EF 30-35%  On Entresto, spironolactone, lasix   A. Fib -  - CHA2DS2 vas score  5    Not on anticoagulation secondary to Risk of Falls,   Cardizem has been added 60 mg TID          - off amiodarone,    Dementia -  not on meds , mild   While in ER: Noted tachy up to 134 K 3.1 ECG showing A.fib  at 141  QTc interval is 539 Started on diltiazem drip  CT showed new mets to liver?, Proctitis?  L2 compression fracture and some moderate to large stool in throughout the colon.  CXR - bilateral edema  UA worrisome for UTI  obtained urine and blood culture  LA 1.7  ED Triage Vitals  Enc Vitals Group     BP 09/28/20 1423 (!) 167/103     Pulse Rate 09/28/20 1422 (!) 134     Resp 09/28/20 1422 18     Temp 09/28/20 1422 98.5 F (36.9 C)     Temp Source  09/28/20 1422 Oral     SpO2 09/28/20 1422 94 %     Weight 09/28/20 1419 103 lb 9.9 oz (47 kg)     Height 09/28/20 1419 5' 3"  (1.6 m)     Head Circumference --      Peak Flow --      Pain Score 09/28/20 1419 3     Pain Loc --      Pain Edu? --      Excl. in [place]? --   TMAX(24)@     _________________________________________ Significant initial  Findings: Abnormal Labs Reviewed  BASIC METABOLIC PANEL - Abnormal; Notable for the following components:      Result Value   Potassium 3.1 (*)    Glucose, Bld 116 (*)    All other components within normal limits  CBC - Abnormal; Notable for the following components:   WBC 14.3 (*)    RBC 5.18 (*)    Hemoglobin 15.2 (*)    HCT 46.9 (*)    RDW 18.8 (*)    All other components within normal limits  BRAIN NATRIURETIC PEPTIDE - Abnormal; Notable for the following components:   B Natriuretic  Peptide 1,687.6 (*)    All other components within normal limits  URINALYSIS, COMPLETE (UACMP) WITH MICROSCOPIC - Abnormal; Notable for the following components:   Color, Urine COLORLESS (*)    APPearance CLEAR (*)    Hgb urine dipstick MODERATE (*)    Leukocytes,Ua LARGE (*)    All other components within normal limits  HEPATIC FUNCTION PANEL - Abnormal; Notable for the following components:   Albumin 3.3 (*)    All other components within normal limits   ____________________________________________ Ordered CT HEAD  NON acute  CXR -  NON acute  CTabd/pelvis -  L2 frx, proctitis new liver lesion   _________________________ Troponin 11 ECG: Ordered Personally reviewed by me showing: HR : 141 Rhythm:   A.fib. W RVR,    no evidence of ischemic changes QTC 539 ____________________ This patient meets SIRS Criteria and may be septic.   The recent clinical data is shown below. Vitals:   09/28/20 1615 09/28/20 1655 09/28/20 1746 09/28/20 1807  BP: (!) 129/99  (!) 137/101 (!) 137/100  Pulse: (!) 108 (!) 111 (!) 106 (!) 112  Resp: 18 18 18 16   Temp:      TempSrc:      SpO2: 94% 100% 94% 99%  Weight:      Height:          WBC     Component Value Date/Time   WBC 14.3 (H) 09/28/2020 1420   LYMPHSABS 1.4 09/12/2020 0513   LYMPHSABS 2.0 06/04/2012 0510   MONOABS 1.5 (H) 09/12/2020 0513   MONOABS 1.9 (H) 06/04/2012 0510   EOSABS 0.4 09/12/2020 0513   EOSABS 0.3 06/04/2012 0510   BASOSABS 0.1 09/12/2020 0513   BASOSABS 0.1 06/04/2012 0510    Lactic Acid, Venous    Component Value Date/Time   LATICACIDVEN 1.7 09/28/2020 1519    Procalcitonin <0.1 Lactic Acid, Venous    Component Value Date/Time   LATICACIDVEN 1.7 09/28/2020 1519     UA  evidence of UTI      Urine analysis:    Component Value Date/Time   COLORURINE COLORLESS (A) 09/28/2020 1521   APPEARANCEUR CLEAR (A) 09/28/2020 1521   APPEARANCEUR Cloudy 05/31/2012 2019   LABSPEC 1.012 09/28/2020 1521    LABSPEC 1.011 05/31/2012 2019   PHURINE 8.0 09/28/2020 1521   GLUCOSEU  NEGATIVE 09/28/2020 1521   GLUCOSEU 50 mg/dL 05/31/2012 2019   HGBUR MODERATE (A) 09/28/2020 1521   BILIRUBINUR NEGATIVE 09/28/2020 1521   BILIRUBINUR Negative 05/31/2012 2019   KETONESUR NEGATIVE 09/28/2020 1521   PROTEINUR NEGATIVE 09/28/2020 1521   NITRITE NEGATIVE 09/28/2020 1521   LEUKOCYTESUR LARGE (A) 09/28/2020 1521   LEUKOCYTESUR Negative 05/31/2012 2019    Results for orders placed or performed during the hospital encounter of 09/28/20  Resp Panel by RT-PCR (Flu A&B, Covid) Nasopharyngeal Swab     Status: None   Collection Time: 09/28/20  3:19 PM   Specimen: Nasopharyngeal Swab; Nasopharyngeal(NP) swabs in vial transport medium  Result Value Ref Range Status   SARS Coronavirus 2 by RT PCR NEGATIVE NEGATIVE Final         Influenza A by PCR NEGATIVE NEGATIVE Final   Influenza B by PCR NEGATIVE NEGATIVE Final            _______________________________________________ Hospitalist was called for admission for A.fib w RVR  The following Work up has been ordered so far:  Orders Placed This Encounter  Procedures   Critical Care   Resp Panel by RT-PCR (Flu A&B, Covid) Nasopharyngeal Swab   Blood culture (routine single)   Urine Culture   DG Chest Port 1 View   CT ABDOMEN PELVIS W CONTRAST   CT HEAD WO CONTRAST (5MM)   CT Cervical Spine Wo Contrast   Basic metabolic panel   CBC   Protime-INR- (order if Patient is taking Coumadin / Warfarin)   Magnesium   TSH   Brain natriuretic peptide   Procalcitonin - Baseline   Urinalysis, Complete w Microscopic   APTT   Hepatic function panel   Diet NPO time specified   Document Height and Actual Weight   If O2 sat   Cardiac monitoring   Assess and Document Glasgow Coma Scale   Document vital signs within 1-hour of fluid bolus completion.  Notify provider of abnormal vital signs despite fluid resuscitation.   Refer to Sidebar Report: Sepsis Bundle ED/IP    Notify provider for difficulties obtaining IV access   Initiate Carrier Fluid Protocol   pharmacy consult   Consult to hospitalist   Airborne and Contact precautions   Pulse oximetry, continuous   EKG 12-Lead   ED EKG   Insert peripheral IV X 1    Following Medications were ordered in ER: Medications  diltiazem (CARDIZEM) 1 mg/mL load via infusion 10 mg (10 mg Intravenous Bolus from Bag 09/28/20 1539)    And  diltiazem (CARDIZEM) 125 mg in dextrose 5% 125 mL (1 mg/mL) infusion (15 mg/hr Intravenous Rate/Dose Change 09/28/20 1808)  potassium chloride 10 mEq in 100 mL IVPB (10 mEq Intravenous New Bag/Given 09/28/20 1807)  lidocaine (LIDODERM) 5 % 1 patch (has no administration in time range)  potassium chloride SA (KLOR-CON) CR tablet 40 mEq (40 mEq Oral Given 09/28/20 1540)  furosemide (LASIX) injection 40 mg (40 mg Intravenous Given 09/28/20 1558)  iohexol (OMNIPAQUE) 350 MG/ML injection 60 mL (60 mLs Intravenous Contrast Given 09/28/20 1616)  cefTRIAXone (ROCEPHIN) 1 g in sodium chloride 0.9 % 100 mL IVPB (0 g Intravenous Stopped 09/28/20 1807)  acetaminophen (TYLENOL) tablet 1,000 mg (1,000 mg Oral Given 09/28/20 1730)        Consult Orders  (From admission, onward)           Start     Ordered   09/28/20 1805  Consult to hospitalist  Once  Provider:  (Not yet assigned)  Question Answer Comment  Place call to: [phone]   Reason for Consult Admit      09/28/20 1804             OTHER Significant initial  Findings:  labs showing:    Recent Labs  Lab 09/28/20 1420  NA 140  K 3.1*  CO2 24  GLUCOSE 116*  BUN 17  CREATININE 0.78  CALCIUM 9.4  MG 2.2    Cr   stable,    Lab Results  Component Value Date   CREATININE 0.78 09/28/2020   CREATININE 0.94 09/12/2020   CREATININE 0.86 09/11/2020    Recent Labs  Lab 09/28/20 1646  AST 19  ALT 14  ALKPHOS 94  BILITOT 0.9  PROT 7.7  ALBUMIN 3.3*   Lab Results  Component Value Date   CALCIUM 9.4  09/28/2020   PHOS 3.0 09/11/2020       Plt: Lab Results  Component Value Date   PLT 366 09/28/2020          Recent Labs  Lab 09/28/20 1420  WBC 14.3*  HGB 15.2*  HCT 46.9*  MCV 90.5  PLT 366    HG/HCT  Up from baseline see below    Component Value Date/Time   HGB 15.2 (H) 09/28/2020 1420   HGB 10.0 (L) 06/04/2012 0510   HCT 46.9 (H) 09/28/2020 1420   HCT 28.8 (L) 06/04/2012 0510   MCV 90.5 09/28/2020 1420   MCV 90 06/04/2012 0510       Cardiac Panel (last 3 results) No results for input(s): CKTOTAL, CKMB, TROPONINI, RELINDX in the last 72 hours.   BNP (last 3 results) Recent Labs    08/07/20 0457 09/11/20 0546 09/28/20 1420  BNP 2,411.8* 1,842.6* 1,687.6*     DM  labs:  HbA1C: Recent Labs    07/28/20 1516  HGBA1C 5.2       CBG (last 3)  No results for input(s): GLUCAP in the last 72 hours.        Cultures:    Component Value Date/Time   SDES  09/10/2020 1415    PLEURAL Performed at [facility], [address] [place], [place] [address]    [facility]  09/10/2020 1415    PLEURAL Performed at [facility], [address] [place], [place] [address]    CULT  09/10/2020 1415    NO GROWTH Performed at [facility], [place] [address], [place], [place] [address]    REPTSTATUS 09/14/2020 FINAL 09/10/2020 1415     Radiological Exams on Admission: CT HEAD WO CONTRAST (5MM)  Result Date: 09/28/2020 CLINICAL DATA:  Atrial fibrillation. Taken off blood thinners due to operation. Back pain and generalized weakness for couple days. Tachycardic to 140. EXAM: CT HEAD WITHOUT CONTRAST CT CERVICAL SPINE WITHOUT CONTRAST TECHNIQUE: Multidetector CT imaging of the head and cervical spine was performed following the standard protocol without intravenous contrast. Multiplanar CT image reconstructions of the cervical spine were also generated. COMPARISON:  CT chest 03/28/2020. FINDINGS: CT HEAD FINDINGS BRAIN: BRAIN Prominence of the  lateral ventricles may be related to central predominant atrophy, although a component of normal pressure/communicating hydrocephalus cannot be excluded. Patchy and confluent areas of decreased attenuation are noted throughout the deep and periventricular white matter of the cerebral hemispheres bilaterally, compatible with chronic microvascular ischemic disease. No evidence of large-territorial acute infarction. No parenchymal hemorrhage. No mass lesion. No extra-axial collection. No mass effect or midline shift.  No hydrocephalus. Basilar cisterns are patent. Vascular: No hyperdense vessel. Atherosclerotic calcifications are present within the cavernous internal carotid arteries. Skull: No acute fracture or focal lesion. Sinuses/Orbits: Paranasal sinuses and mastoid air cells are clear. Bilateral lens replacement. Otherwise orbits are unremarkable. Other: None. CT CERVICAL SPINE FINDINGS Alignment: Normal. Skull base and vertebrae: Amorphous calcification surrounding the C7 and T1 spinous processes (6:35) of unclear etiology but similar to prior CT chest. No acute fracture. No aggressive appearing focal osseous lesion or focal pathologic process. Soft tissues and spinal canal: No prevertebral fluid or swelling. No visible canal hematoma. Upper chest: Unremarkable. Other: A 0.8 cm hypodensity within the left thyroid gland. Not clinically significant; no follow-up imaging recommended (ref: J Am Coll Radiol. 2015 Feb;12(2): 143-50). IMPRESSION: 1. No acute intracranial abnormality. 2. Prominence of the lateral ventricles may be related to central predominant atrophy, although a component of normal pressure/communicating hydrocephalus cannot be excluded. 3. No acute displaced fracture or traumatic listhesis of the cervical spine. Electronically Signed   By: Iven Finn M.D.   On: 09/28/2020 17:58   CT Cervical Spine Wo Contrast  Result Date: 09/28/2020 CLINICAL DATA:  Atrial fibrillation. Taken off blood  thinners due to operation. Back pain and generalized weakness for couple days. Tachycardic to 140. EXAM: CT HEAD WITHOUT CONTRAST CT CERVICAL SPINE WITHOUT CONTRAST TECHNIQUE: Multidetector CT imaging of the head and cervical spine was performed following the standard protocol without intravenous contrast. Multiplanar CT image reconstructions of the cervical spine were also generated. COMPARISON:  CT chest 03/28/2020. FINDINGS: CT HEAD FINDINGS BRAIN: BRAIN Prominence of the lateral ventricles may be related to central predominant atrophy, although a component of normal pressure/communicating hydrocephalus cannot be excluded. Patchy and confluent areas of decreased attenuation are noted throughout the deep and periventricular white matter of the cerebral hemispheres bilaterally, compatible with chronic microvascular ischemic disease. No evidence of large-territorial acute infarction. No parenchymal hemorrhage. No mass lesion. No extra-axial collection. No mass effect or midline shift. No hydrocephalus. Basilar cisterns are patent. Vascular: No hyperdense vessel. Atherosclerotic calcifications are present within the cavernous internal carotid arteries. Skull: No acute fracture or focal lesion. Sinuses/Orbits: Paranasal sinuses and mastoid air cells are clear. Bilateral lens replacement. Otherwise orbits are unremarkable. Other: None. CT CERVICAL SPINE FINDINGS Alignment: Normal. Skull base and vertebrae: Amorphous calcification surrounding the C7 and T1 spinous processes (6:35) of unclear etiology but similar to prior CT chest. No acute fracture. No aggressive appearing focal osseous lesion or focal pathologic process. Soft tissues and spinal canal: No prevertebral fluid or swelling. No visible canal hematoma. Upper chest: Unremarkable. Other: A 0.8 cm hypodensity within the left thyroid gland. Not clinically significant; no follow-up imaging recommended (ref: J Am Coll Radiol. 2015 Feb;12(2): 143-50). IMPRESSION: 1.  No acute intracranial abnormality. 2. Prominence of the lateral ventricles may be related to central predominant atrophy, although a component of normal pressure/communicating hydrocephalus cannot be excluded. 3. No acute displaced fracture or traumatic listhesis of the cervical spine. Electronically Signed   By: Iven Finn M.D.   On: 09/28/2020 17:58   CT ABDOMEN PELVIS W CONTRAST  Result Date: 09/28/2020 CLINICAL DATA:  Abdominal pain, acute, nonlocalized. History of right hemicolectomy in July of 2022 for colon cancer EXAM: CT ABDOMEN AND PELVIS WITH CONTRAST TECHNIQUE: Multidetector CT imaging of the abdomen and pelvis was performed using the standard protocol following bolus administration of intravenous contrast. CONTRAST:  46m OMNIPAQUE IOHEXOL 350 MG/ML SOLN COMPARISON:  06/17/2020. FINDINGS: Lower chest: Moderate bilateral pleural effusions  with associated compressive atelectasis of the bilateral lower lobes. Cardiomegaly. Coronary artery atherosclerosis. Hepatobiliary: 1.9 cm indeterminate density lesion within the right hepatic lobe not definitively seen on the previous study (series 2, image 18) numerous hepatic cysts are similar the prior study. Suspected gallbladder sludge. No hyperdense gallstone. No pericholecystic inflammatory changes by CT. No biliary dilatation. Pancreas: Unremarkable. No pancreatic ductal dilatation or surrounding inflammatory changes. Spleen: Normal in size without focal abnormality. Adrenals/Urinary Tract: Unremarkable adrenal glands. Kidneys enhance symmetrically. No renal stone or hydronephrosis. Mild diffuse urinary bladder wall thickening. Small bladder diverticula. Stomach/Bowel: Small hiatal hernia. Stomach appears otherwise within normal limits. No dilated loops of bowel. Interval postsurgical changes of a right hemicolectomy. Circumferential wall thickening within the low rectum. Scattered colonic diverticulosis. Moderate-large volume of stool throughout the  colon. Vascular/Lymphatic: Extensive atherosclerosis throughout the aortoiliac axis. No abdominopelvic lymphadenopathy. Reproductive: Uterus and bilateral adnexa are unremarkable. Other: No free fluid. No abdominopelvic fluid collection. No pneumoperitoneum. No abdominal wall hernia. Musculoskeletal: Prior right hip ORIF. Healed right inferior pubic ramus fracture. There is a subtle superior endplate compression fracture of L2 which is new from the previous CT with less than 10% vertebral body height loss. IMPRESSION: 1. Moderate bilateral pleural effusions with associated compressive atelectasis. 2. Indeterminate 1.9 cm density lesion within the right hepatic lobe not definitively seen on the previous study. Findings are concerning for hepatic metastatic disease given history of colon cancer. Further evaluation with non-emergent MRI of the abdomen is recommended. 3. Circumferential wall thickening within the low rectum may represent proctitis. 4. New subtle superior endplate compression fracture of L2 with less than 10% vertebral body height loss. 5. Moderate-large volume of stool throughout the colon. Interval right hemicolectomy. 6. Mild diffuse urinary bladder wall thickening with small bladder diverticula. Correlate with urinalysis to exclude cystitis. Aortic Atherosclerosis (ICD10-I70.0). Electronically Signed   By: Davina Poke D.O.   On: 09/28/2020 16:57   DG Chest Port 1 View  Result Date: 09/28/2020 CLINICAL DATA:  Atrial fibrillation. Recently taken off of blood thinners. Back pain and generalized pain. Tachycardic to 140. EXAM: PORTABLE CHEST 1 VIEW COMPARISON:  Chest x-ray 09/12/2020, CT chest 03/28/2020 FINDINGS: The heart and mediastinal contours are unchanged. Aortic calcification. No focal consolidation. Increased interstitial markings. Interval increase in size of bilateral small pleural effusions, left greater right. No pneumothorax. No acute osseous abnormality. IMPRESSION: Pulmonary  edema with interval increase in size of bilateral small pleural effusions, left greater right. Electronically Signed   By: Iven Finn M.D.   On: 09/28/2020 15:21   _______________________________________________________________________________________________________ Latest  Blood pressure (!) 137/100, pulse (!) 112, temperature 98.5 F (36.9 C), temperature source Oral, resp. rate 16, height 5' 3"  (1.6 m), weight 47 kg, SpO2 99 %.   Review of Systems:    Pertinent positives include:  falls, constipation ,  fatigue, urinary frequency.  Constitutional:  No weight loss, night sweats, Fevers, chills,  weight loss  HEENT:  No headaches, Difficulty swallowing,Tooth/dental problems,Sore throat,  No sneezing, itching, ear ache, nasal congestion, post nasal drip,  Cardio-vascular:  No chest pain, Orthopnea, PND, anasarca, dizziness, palpitations.no Bilateral lower extremity swelling  GI:  No heartburn, indigestion, abdominal pain, nausea, vomiting, diarrhea, change in bowel habits, loss of appetite, melena, blood in stool, hematemesis Resp:  no shortness of breath at rest. No dyspnea on exertion, No excess mucus, no productive cough, No non-productive cough, No coughing up of blood.No change in color of mucus.No wheezing. Skin:  no rash or lesions. No jaundice GU:  no dysuria, change in color of urine, no urgency orNo straining to urinate.  No flank pain.  Musculoskeletal:  No joint pain or no joint swelling. No decreased range of motion. No back pain.  Psych:  No change in mood or affect. No depression or anxiety. No memory loss.  Neuro: no localizing neurological complaints, no tingling, no weakness, no double vision, no gait abnormality, no slurred speech, no confusion  All systems reviewed and apart from [place] all are negative _______________________________________________________________________________________________ Past Medical History:   Past Medical History:  Diagnosis  Date   Anemia    Anxiety    Cancer ([place])    Squamous Cell Carcinoma (top of head)   Colon cancer ([place])    Constipation, chronic    Deaf, left    Diverticular disease    GERD (gastroesophageal reflux disease)    Hearing aid worn    Hearing deficit    History of Bell's palsy    left side - resolved - 50 yrs ago   Hypertension    Internal hemorrhoids    Iron deficiency anemia    Mitral regurgitation    moderate to severe on 03/27/20 echo   Osteopenia    Pure hypercholesterolemia       Past Surgical History:  Procedure Laterality Date   CATARACT EXTRACTION W/PHACO Left 11/16/2015   Procedure: CATARACT EXTRACTION PHACO AND INTRAOCULAR LENS PLACEMENT ([place]);  Surgeon: Leandrew Koyanagi, MD;  Location: [facility];  Service: Ophthalmology;  Laterality: Left;  TORIC   CATARACT EXTRACTION W/PHACO Right 12/07/2015   Procedure: CATARACT EXTRACTION PHACO AND INTRAOCULAR LENS PLACEMENT ([facility]);  Surgeon: Leandrew Koyanagi, MD;  Location: [facility];  Service: Ophthalmology;  Laterality: Right;  TORIC   COLONOSCOPY WITH PROPOFOL N/A 08/10/2014   Procedure: COLONOSCOPY WITH PROPOFOL;  Surgeon: Lollie Sails, MD;  Location: [facility] ENDOSCOPY;  Service: Endoscopy;  Laterality: N/A;   COLONOSCOPY WITH PROPOFOL N/A 05/12/2020   Procedure: COLONOSCOPY WITH PROPOFOL;  Surgeon: Lin Landsman, MD;  Location: [facility] ENDOSCOPY;  Service: Gastroenterology;  Laterality: N/A;   ESOPHAGOGASTRODUODENOSCOPY (EGD) WITH PROPOFOL N/A 03/28/2020   Procedure: ESOPHAGOGASTRODUODENOSCOPY (EGD) WITH PROPOFOL;  Surgeon: Lucilla Lame, MD;  Location: [facility] ENDOSCOPY;  Service: Endoscopy;  Laterality: N/A;   EYE SURGERY     FEMUR FRACTURE SURGERY     FRACTURE SURGERY     GIVENS CAPSULE STUDY N/A 04/19/2020   Procedure: GIVENS CAPSULE STUDY;  Surgeon: Lin Landsman, MD;  Location: [facility] ENDOSCOPY;  Service: Gastroenterology;  Laterality: N/A;   LAPAROSCOPIC RIGHT HEMI COLECTOMY Right 08/04/2020    Procedure: LAPAROSCOPIC RIGHT HEMI COLECTOMY;  Surgeon: [name], MD;  Location: [facility];  Service: General;  Laterality: Right;   Nasal Polyp Removal      Social History:  Ambulatory   walker     reports that she quit smoking about 34 years ago. Her smoking use included cigarettes. She has never used smokeless tobacco. She reports that she does not drink alcohol and does not use drugs.    Family History:   Family History  Problem Relation Age of Onset   Colon cancer Mother    ______________________________________________________________________________________________ Allergies: Allergies  Allergen Reactions   Metoprolol Swelling    Feet, ankles and legs    Bactrim [Sulfamethoxazole-Trimethoprim] Other (See Comments)    Hearing loss   Ciprofloxacin Other (See Comments)    Flu-like symptoms   Pneumococcal Vaccines Other (See Comments)    Flu-like symptoms     Prior to Admission medications  Medication Sig Start Date End Date Taking? Authorizing Provider  calcium carbonate (TUMS - DOSED IN MG ELEMENTAL CALCIUM) 500 MG chewable tablet Chew 500 mg by mouth daily as needed for indigestion or heartburn.    [provider]  fluticasone (FLONASE) 50 MCG/ACT nasal spray Place 1 spray into both nostrils daily.    [provider]  furosemide (LASIX) 40 MG tablet Take 1 tablet (40 mg total) by mouth daily. 08/14/20   Mercy Riding, MD  iron polysaccharides (NIFEREX) 150 MG capsule Take 1 capsule (150 mg total) by mouth daily. 08/14/20   Mercy Riding, MD  lovastatin (MEVACOR) 40 MG tablet Take 40 mg by mouth every evening.    [provider]  pantoprazole (PROTONIX) 40 MG tablet Take 1 tablet (40 mg total) by mouth 2 (two) times daily before a meal. Patient taking differently: Take 40 mg by mouth 2 (two) times daily as needed (acid reflux). 04/02/20 08/04/20  Enzo Bi, MD  Polyethyl Glycol-Propyl Glycol (SYSTANE OP) Place 1 drop into both eyes in the  morning and at bedtime.    [provider]  polyethylene glycol powder (GLYCOLAX/MIRALAX) 17 GM/SCOOP powder Take 17 g by mouth 2 (two) times daily as needed for moderate constipation.    [provider]  sacubitril-valsartan (ENTRESTO) 24-26 MG Take 1 tablet by mouth 2 (two) times daily. 09/12/20 12/11/20  Terrilee Croak, MD  spironolactone (ALDACTONE) 25 MG tablet Take 0.5 tablets (12.5 mg total) by mouth daily. 09/13/20 12/12/20  Terrilee Croak, MD  tretinoin (RETIN-A) 0.05 % cream Apply 1 application topically at bedtime. 07/19/20   [provider]    ___________________________________________________________________________________________________ Physical Exam: Vitals with BMI 09/28/2020 09/28/2020 09/28/2020  Height - - -  Weight - - -  BMI - - -  Systolic 397 673 -  Diastolic 419 379 -  Pulse 112 106 111     1. General:  in No  Acute distress     Chronically ill   -appearing 2. Psychological: Alert and   Oriented 3. Head/ENT:    Dry Mucous Membranes                          Head Non traumatic, neck supple                           Poor Dentition 4. SKIN: normal  Skin turgor,  Skin clean Dry and intact no rash 5. Heart: Regular rate and rhythm no  Murmur, no Rub or gallop 6. Lungs:  Clear to auscultation bilaterally, no wheezes or crackles   7. Abdomen: Soft, suprapubic tender, Non distended  bowel sounds present 8. Lower extremities: no clubbing, cyanosis, trace edema 9. Neurologically Grossly intact, moving all 4 extremities equally intact 10. MSK: Normal range of motion    Chart has been reviewed  ______________________________________________________________________________________________  Assessment/Plan 87 y.o. female with medical history significant of  colon cancer status post colectomy not currently undergoing chemotherapy, HTN, HLD, chronic systolic CHF  (EF 30 to 02%), GERD, anxiety and some mild dementia     Admitted for Possible sepsis  with UTI, a.fib w RVR and new liver lesion  Present on Admission:  Atrial fibrillation with RVR ([place]) -  - Admit to step down on Cardizem drip       CHA2D-VASC score 5 not on anticoagulation secondary to recurrent falls  Check TSH      Cycle cardiac enzymes     Had already recent ECHO      Cardiology consult in AM   Cancer of ascending colon s/p lap colectomy 08/04/2020 email oncology to let them know patient has been admitted New liver lesion could be related to prior diagnosis of colon cancer.  Would need to further discuss with family and oncology if would be interested in pursuing this.  Discussed with son at night who felt patient will be less inclined to do so   Constipation, chronic -resulting in mild proctitis.  Order bowel regimen   GERD (gastroesophageal reflux disease) continue PPI    HLD (hyperlipidemia) chronic continue statin    HTN (hypertension) currently on diltiazem drip blood pressure stable   UTI (urinary tract infection) -  - treat with Rocephin        await results of urine culture and adjust antibiotic coverage as needed   Dementia without behavioral disturbance (HCC) mild monitor for any evidence of sundowning   Hypokalemia - - will replace and repeat in AM,  check magnesium level and replace as needed   Proctitis likely in the setting of chronic constipation we will add Flagyl and treat constipation    Sepsis ([place]) -initially met sepsis criteria with elevated white blood cell count tachycardia.  No evidence of hypotension improved to be slightly fluid overloaded   blood cultures obtained started on IV antibiotics Avoid over aggressive fluid resuscitation  Liver lesion  Discussed with son could be potentially colon cancer metastasis.  Will need to discuss with family and patient if she would be interested in pursuing this  L2 compression fracture secondary to recent fall.  Will need pain management PT OT assessment may need placement for  rehabilitation could give a trial of TLSO brace  Acute on Chronic systolic CHF.  Received a dose of Lasix in the emergency department fluid steroid-induced appears to be improving.  At this time continue Entresto and Aldactone Continue gentle diuresis fluid overload also could be secondary to hypoalbuminemia check prealbumin and nutritional consult ordered Other plan as per orders.  DVT prophylaxis:  SCD     Code Status:    Code Status: Prior DNR/DNI  as per patient  I had personally discussed CODE STATUS with patient and family Son states that family and patient will be interested in palliative care consult has been ordered would like to avoid over aggressive treatment  Family Communication:   Family not at  Bedside  plan of care was discussed on the phone with  Son,   Disposition Plan:  likely will need placement for rehabilitation Following barriers for discharge:                            Electrolytes corrected                                                         Pain controlled with PO medications                             white count improving able to transition to PO antibiotics  Will need to be able to tolerate PO                            Will likely need home health,                            Will need consultants to evaluate patient prior to discharge                    Would benefit from PT/OT eval prior to DC  Ordered                   Transition of care consulted                   Nutrition    consulted                   Palliative care    consulted              Consults called: emailed Cardiology   Admission status:  ED Disposition     ED Disposition  [place]: [place] [[id number]]  Level of Care: Progressive Cardiac [[id number]]  Admit to Progressive based on following criteria: CARDIOVASCULAR & THORACIC of moderate stability with acute coronary syndrome symptoms/low risk  myocardial infarction/hypertensive urgency/arrhythmias/heart failure potentially compromising stability and stable post cardiovascular intervention patients.  Covid Evaluation: Confirmed COVID Negative  Diagnosis: Atrial fibrillation with RVR [facility]) [[id number]]  Admitting Physician: Toy Baker [[id number]]  Attending Physician: Toy Baker [[id number]]  Estimated length of stay: past midnight tomorrow  Certification:: I certify this patient will need inpatient services for at least 2 midnights          inpatient     I Expect 2 midnight stay secondary to severity of patient's current illness need for inpatient interventions justified by the following:  hemodynamic instability despite optimal treatment (tachycardia  )   Severe lab/radiological/exam abnormalities including:    A.fib w RVR and UTI and extensive comorbidities including:   CHF  dementia  malignancy, That are currently affecting medical management I expect  patient to be hospitalized for 2 midnights requiring inpatient medical care.  Patient is at high risk for adverse outcome (such as loss of life or disability) if not treated.  Indication for inpatient stay as follows:    Hemodynamic instability despite maximal medical therapy,     Need for IV antibiotics,  IV rate controling medications,     Level of care   progressive tele indefinitely please discontinue once patient no longer qualifies COVID-19 Labs    Lab Results  Component Value Date   [place] 09/28/2020     Precautions: admitted as  Covid Negative    PPE: Used by the provider:   N95  eye Goggles,  Gloves   Jermiyah Ricotta 09/28/2020, 8:48 PM   [facility]     after 2 AM please page floor coverage PA If 7AM-7PM, please contact the day team taking care of the patient using [email or web address]   Patient was evaluated in the context of the global COVID-19 pandemic, which necessitated consideration that the patient might be at  risk for infection with the SARS-CoV-2 virus that causes COVID-19. Institutional protocols and algorithms that pertain to the evaluation of patients at risk for COVID-19 are in a state of rapid  change based on information released by regulatory bodies including the CDC and federal and state organizations. These policies and algorithms were followed during the patient's care.

## 2020-09-28 NOTE — ED Triage Notes (Signed)
Pt comes ems from home not feeling well. Unable to list specific complaint but pt does have afib and is 110-160s on the monitor with EMS. Has not been taking meds as prescribed. Hx of dementia.

## 2020-09-28 NOTE — ED Triage Notes (Signed)
See first nurse note, from home, EMS reports a fib.  States recently taken off blood thinners d/t operation.  Reports some back pain and generalized weakness for a couple of days HR 140 in triage  Pt continually states she feels like she is going to die

## 2020-09-29 ENCOUNTER — Encounter: Payer: Self-pay | Admitting: Internal Medicine

## 2020-09-29 DIAGNOSIS — Z515 Encounter for palliative care: Secondary | ICD-10-CM | POA: Diagnosis not present

## 2020-09-29 DIAGNOSIS — N3000 Acute cystitis without hematuria: Secondary | ICD-10-CM | POA: Diagnosis not present

## 2020-09-29 LAB — CBC WITH DIFFERENTIAL/PLATELET
Abs Immature Granulocytes: 0.04 10*3/uL (ref 0.00–0.07)
Basophils Absolute: 0.1 10*3/uL (ref 0.0–0.1)
Basophils Relative: 1 %
Eosinophils Absolute: 0.1 10*3/uL (ref 0.0–0.5)
Eosinophils Relative: 1 %
HCT: 46.7 % — ABNORMAL HIGH (ref 36.0–46.0)
Hemoglobin: 15 g/dL (ref 12.0–15.0)
Immature Granulocytes: 0 %
Lymphocytes Relative: 10 %
Lymphs Abs: 0.9 10*3/uL (ref 0.7–4.0)
MCH: 29 pg (ref 26.0–34.0)
MCHC: 32.1 g/dL (ref 30.0–36.0)
MCV: 90.2 fL (ref 80.0–100.0)
Monocytes Absolute: 1 10*3/uL (ref 0.1–1.0)
Monocytes Relative: 11 %
Neutro Abs: 7 10*3/uL (ref 1.7–7.7)
Neutrophils Relative %: 77 %
Platelets: 349 10*3/uL (ref 150–400)
RBC: 5.18 MIL/uL — ABNORMAL HIGH (ref 3.87–5.11)
RDW: 18.6 % — ABNORMAL HIGH (ref 11.5–15.5)
WBC: 9.2 10*3/uL (ref 4.0–10.5)
nRBC: 0 % (ref 0.0–0.2)

## 2020-09-29 LAB — COMPREHENSIVE METABOLIC PANEL
ALT: 14 U/L (ref 0–44)
AST: 26 U/L (ref 15–41)
Albumin: 3.5 g/dL (ref 3.5–5.0)
Alkaline Phosphatase: 100 U/L (ref 38–126)
Anion gap: 10 (ref 5–15)
BUN: 17 mg/dL (ref 8–23)
CO2: 28 mmol/L (ref 22–32)
Calcium: 9.3 mg/dL (ref 8.9–10.3)
Chloride: 99 mmol/L (ref 98–111)
Creatinine, Ser: 0.83 mg/dL (ref 0.44–1.00)
GFR, Estimated: >60 mL/min (ref >60)
Glucose, Bld: 150 mg/dL — ABNORMAL HIGH (ref 70–99)
Potassium: 3.8 mmol/L (ref 3.5–5.1)
Sodium: 137 mmol/L (ref 135–145)
Total Bilirubin: 0.8 mg/dL (ref 0.3–1.2)
Total Protein: 8.3 g/dL — ABNORMAL HIGH (ref 6.5–8.1)

## 2020-09-29 LAB — BASIC METABOLIC PANEL
Anion gap: 12 (ref 5–15)
BUN: 18 mg/dL (ref 8–23)
CO2: 27 mmol/L (ref 22–32)
Calcium: 9.1 mg/dL (ref 8.9–10.3)
Chloride: 100 mmol/L (ref 98–111)
Creatinine, Ser: 0.89 mg/dL (ref 0.44–1.00)
GFR, Estimated: >60 mL/min (ref >60)
Glucose, Bld: 108 mg/dL — ABNORMAL HIGH (ref 70–99)
Potassium: 3.5 mmol/L (ref 3.5–5.1)
Sodium: 139 mmol/L (ref 135–145)

## 2020-09-29 LAB — MAGNESIUM
Magnesium: 2.2 mg/dL (ref 1.7–2.4)
Magnesium: 2.2 mg/dL (ref 1.7–2.4)

## 2020-09-29 LAB — PREALBUMIN: Prealbumin: 17.1 mg/dL — ABNORMAL LOW (ref 18–38)

## 2020-09-29 LAB — PHOSPHORUS: Phosphorus: 4.1 mg/dL (ref 2.5–4.6)

## 2020-09-29 LAB — TSH: TSH: 1.28 u[IU]/mL (ref 0.350–4.500)

## 2020-09-29 MED ORDER — AMIODARONE HCL IN DEXTROSE 360-4.14 MG/200ML-% IV SOLN
30.0000 mg/h | INTRAVENOUS | Status: DC
  Administered 2020-09-29 – 2020-10-02 (×4): 30 mg/h via INTRAVENOUS
  Filled 2020-09-29 (×6): qty 200

## 2020-09-29 MED ORDER — SODIUM CHLORIDE 0.9 % IV SOLN: 250.0000 mL | INTRAVENOUS | Status: DC | PRN

## 2020-09-29 MED ORDER — SPIRONOLACTONE 25 MG PO TABS
12.5000 mg | ORAL_TABLET | Freq: Every day | ORAL | Status: DC
  Administered 2020-09-29 – 2020-10-04 (×6): 12.5 mg via ORAL
  Filled 2020-09-29 (×2): qty 0.5
  Filled 2020-09-29 (×2): qty 1
  Filled 2020-09-29 (×2): qty 0.5
  Filled 2020-09-29: qty 1
  Filled 2020-09-29 (×2): qty 0.5
  Filled 2020-09-29 (×2): qty 1

## 2020-09-29 MED ORDER — AMIODARONE LOAD VIA INFUSION
150.0000 mg | Freq: Once | INTRAVENOUS | Status: AC
  Administered 2020-09-29: 150 mg via INTRAVENOUS
  Filled 2020-09-29: qty 83.34

## 2020-09-29 MED ORDER — TRAMADOL HCL 50 MG PO TABS: 50.0000 mg | ORAL_TABLET | Freq: Four times a day (QID) | ORAL | Status: DC | PRN

## 2020-09-29 MED ORDER — SODIUM CHLORIDE 0.9% FLUSH
3.0000 mL | Freq: Two times a day (BID) | INTRAVENOUS | Status: DC
  Administered 2020-09-29 – 2020-10-01 (×6): 3 mL via INTRAVENOUS

## 2020-09-29 MED ORDER — AMIODARONE HCL IN DEXTROSE 360-4.14 MG/200ML-% IV SOLN
60.0000 mg/h | INTRAVENOUS | Status: DC
  Administered 2020-09-29 (×2): 60 mg/h via INTRAVENOUS
  Filled 2020-09-29: qty 200

## 2020-09-29 MED ORDER — ACETAMINOPHEN 325 MG PO TABS: 650.0000 mg | ORAL_TABLET | Freq: Four times a day (QID) | ORAL | Status: DC | PRN

## 2020-09-29 MED ORDER — ACETAMINOPHEN 650 MG RE SUPP: 650.0000 mg | Freq: Four times a day (QID) | RECTAL | Status: DC | PRN

## 2020-09-29 MED ORDER — SODIUM CHLORIDE 0.9% FLUSH: 3.0000 mL | INTRAVENOUS | Status: DC | PRN

## 2020-09-29 MED ORDER — PRAVASTATIN SODIUM 20 MG PO TABS
20.0000 mg | ORAL_TABLET | Freq: Every day | ORAL | Status: DC
  Administered 2020-09-29 – 2020-10-03 (×5): 20 mg via ORAL
  Filled 2020-09-29 (×5): qty 1

## 2020-09-29 MED ORDER — SACUBITRIL-VALSARTAN 24-26 MG PO TABS
1.0000 | ORAL_TABLET | Freq: Two times a day (BID) | ORAL | Status: DC
  Administered 2020-09-29 – 2020-10-04 (×11): 1 via ORAL
  Filled 2020-09-29 (×12): qty 1

## 2020-09-29 MED ORDER — RIVAROXABAN 15 MG PO TABS
15.0000 mg | ORAL_TABLET | Freq: Every day | ORAL | Status: DC
  Administered 2020-09-29 – 2020-10-03 (×5): 15 mg via ORAL
  Filled 2020-09-29 (×6): qty 1

## 2020-09-29 MED ORDER — PANTOPRAZOLE SODIUM 40 MG PO TBEC
40.0000 mg | DELAYED_RELEASE_TABLET | Freq: Two times a day (BID) | ORAL | Status: DC
  Administered 2020-09-29 – 2020-10-04 (×10): 40 mg via ORAL
  Filled 2020-09-29 (×11): qty 1

## 2020-09-29 MED ORDER — HYDROCODONE-ACETAMINOPHEN 5-325 MG PO TABS
1.0000 | ORAL_TABLET | ORAL | Status: DC | PRN
  Administered 2020-09-29: 1 via ORAL
  Filled 2020-09-29: qty 1

## 2020-09-29 MED ORDER — POTASSIUM CHLORIDE CRYS ER 20 MEQ PO TBCR
40.0000 meq | EXTENDED_RELEASE_TABLET | Freq: Once | ORAL | Status: DC
  Filled 2020-09-29: qty 2

## 2020-09-29 NOTE — ED Notes (Signed)
Patient given hearing aids and now in bilateral ears

## 2020-09-29 NOTE — Consult Note (Addendum)
[facility] Consultation Note  Patient ID: [name] [name], MRN: [id number], DOB/AGE: Jan 26, 1930 87 y.o. Admit date: 09/28/2020   Date of Consult: 09/29/2020 Primary Physician: [name], MD Primary Cardiologist: Dr. Nehemiah Massed  Chief Complaint:  Chief Complaint  Patient presents with   Palpitations   Reason for Consult:  Atrial fibrillation with rapid ventricular rate, acute on chronic systolic congestive heart failure  HPI: 87 y.o. female with a past medical history of chronic systolic CHF tachycardia induced with a most recent EF of 30 to 35%, paroxysmal atrial fibrillation.  Patient presented to the ED yesterday with palpitations, back pain and denied any chest pain or shortness of breath.  Patient was recently admitted to [facility] and discharged on 09/12/2020 for a CHF exacerbation where she was started on Entresto and spironolactone for management as an outpatient.  Patient has a history of paroxysmal atrial fibrillation and has stopped her Xarelto at some point possibly due to fall risk.  Patient was admitted to [facility] last night after being found to be in A. fib with RVR of 141 bpm in the ED and started on a Cardizem drip for heart rate control.Troponin in the ED was 11, BNP 1687. CXR showed pulmonary edema with bilateral small pleural effusions left greater than right. Patient was started on Lasix 40 mg IV once daily for volume control.  Today patient states that she feels well and denies any chest pain, shortness of breath.  Patient states that she does not feel volume overloaded at this time.  She currently remains on a Cardizem drip for heart rate control with heart rate in the 80s today.  Patient's most recent echocardiogram on 08/08/2020 showed moderately decreased LV systolic function with EF of 30-35% likely secondary to tachycardia induced.    Past Medical History:  Diagnosis Date   Anemia    Anxiety    Cancer ([place])    Squamous Cell Carcinoma (top of head)    Colon cancer ([place])    Constipation, chronic    Deaf, left    Diverticular disease    GERD (gastroesophageal reflux disease)    Hearing aid worn    Hearing deficit    History of Bell's palsy    left side - resolved - 50 yrs ago   Hypertension    Internal hemorrhoids    Iron deficiency anemia    Mitral regurgitation    moderate to severe on 03/27/20 echo   Osteopenia    Pure hypercholesterolemia       Surgical History:  Past Surgical History:  Procedure Laterality Date   CATARACT EXTRACTION W/PHACO Left 11/16/2015   Procedure: CATARACT EXTRACTION PHACO AND INTRAOCULAR LENS PLACEMENT ([place]);  Surgeon: Leandrew Koyanagi, MD;  Location: [place];  Service: Ophthalmology;  Laterality: Left;  TORIC   CATARACT EXTRACTION W/PHACO Right 12/07/2015   Procedure: CATARACT EXTRACTION PHACO AND INTRAOCULAR LENS PLACEMENT ([facility]);  Surgeon: Leandrew Koyanagi, MD;  Location: [place];  Service: Ophthalmology;  Laterality: Right;  TORIC   COLONOSCOPY WITH PROPOFOL N/A 08/10/2014   Procedure: COLONOSCOPY WITH PROPOFOL;  Surgeon: Lollie Sails, MD;  Location: [facility] ENDOSCOPY;  Service: Endoscopy;  Laterality: N/A;   COLONOSCOPY WITH PROPOFOL N/A 05/12/2020   Procedure: COLONOSCOPY WITH PROPOFOL;  Surgeon: Lin Landsman, MD;  Location: [facility] ENDOSCOPY;  Service: Gastroenterology;  Laterality: N/A;   ESOPHAGOGASTRODUODENOSCOPY (EGD) WITH PROPOFOL N/A 03/28/2020   Procedure: ESOPHAGOGASTRODUODENOSCOPY (EGD) WITH PROPOFOL;  Surgeon: Lucilla Lame, MD;  Location: [facility] ENDOSCOPY;  Service: Endoscopy;  Laterality: N/A;   EYE SURGERY     FEMUR FRACTURE SURGERY     FRACTURE SURGERY     GIVENS CAPSULE STUDY N/A 04/19/2020   Procedure: GIVENS CAPSULE STUDY;  Surgeon: Lin Landsman, MD;  Location: [facility] ENDOSCOPY;  Service: Gastroenterology;  Laterality: N/A;   LAPAROSCOPIC RIGHT HEMI COLECTOMY Right 08/04/2020   Procedure: LAPAROSCOPIC RIGHT HEMI COLECTOMY;  Surgeon: [name], MD;  Location: [facility];  Service: General;  Laterality: Right;   Nasal Polyp Removal       Home Meds: Prior to Admission medications   Medication Sig Start Date End Date Taking? Authorizing Provider  diltiazem (CARDIZEM) 60 MG tablet Take 60 mg by mouth 3 (three) times daily. 09/16/20  Yes [provider]  fluticasone (FLONASE) 50 MCG/ACT nasal spray Place 1 spray into both nostrils daily.   Yes [provider]  furosemide (LASIX) 40 MG tablet Take 1 tablet (40 mg total) by mouth daily. 08/14/20  Yes Mercy Riding, MD  iron polysaccharides (NIFEREX) 150 MG capsule Take 1 capsule (150 mg total) by mouth daily. 08/14/20  Yes Mercy Riding, MD  lovastatin (MEVACOR) 20 MG tablet Take 20 mg by mouth at bedtime. 09/16/20  Yes [provider]  pantoprazole (PROTONIX) 40 MG tablet Take 1 tablet (40 mg total) by mouth 2 (two) times daily before a meal. Patient taking differently: Take 40 mg by mouth 2 (two) times daily as needed (acid reflux). 04/02/20 09/28/20 Yes Enzo Bi, MD  Polyethyl Glycol-Propyl Glycol (SYSTANE OP) Place 1 drop into both eyes in the morning and at bedtime.   Yes [provider]  polyethylene glycol powder (GLYCOLAX/MIRALAX) 17 GM/SCOOP powder Take 17 g by mouth 2 (two) times daily as needed for moderate constipation.   Yes [provider]  sacubitril-valsartan (ENTRESTO) 24-26 MG Take 1 tablet by mouth 2 (two) times daily. 09/12/20 12/11/20 Yes Dahal, Marlowe Aschoff, MD  spironolactone (ALDACTONE) 25 MG tablet Take 0.5 tablets (12.5 mg total) by mouth daily. 09/13/20 12/12/20 Yes Dahal, Marlowe Aschoff, MD  traMADol (ULTRAM) 50 MG tablet Take 50 mg by mouth every 6 (six) hours as needed for pain. 09/26/20  Yes [provider]  tretinoin (RETIN-A) 0.05 % cream Apply 1 application topically at bedtime. 07/19/20  Yes [provider]  calcium carbonate (TUMS - DOSED IN MG ELEMENTAL CALCIUM) 500 MG chewable tablet Chew 500 mg by mouth  daily as needed for indigestion or heartburn.    [provider]    Inpatient Medications:   amiodarone  150 mg Intravenous Once   furosemide  40 mg Intravenous Daily   lidocaine  1 patch Transdermal Q24H   milk and molasses  1 enema Rectal Once   polyethylene glycol  17 g Oral Daily   rivaroxaban  20 mg Oral Q supper    amiodarone     Followed by   amiodarone     cefTRIAXone (ROCEPHIN)  IV     diltiazem (CARDIZEM) infusion 15 mg/hr (09/29/20 0738)   metronidazole Stopped (09/29/20 0551)    Allergies:  Allergies  Allergen Reactions   Metoprolol Swelling    Feet, ankles and legs    Bactrim [Sulfamethoxazole-Trimethoprim] Other (See Comments)    Hearing loss   Ciprofloxacin Other (See Comments)    Flu-like symptoms   Pneumococcal Vaccines Other (See Comments)    Flu-like symptoms    Social History   Socioeconomic History   Marital status: Widowed    Spouse name: Not on file  Number of children: Not on file   Years of education: Not on file   Highest education level: Not on file  Occupational History   Not on file  Tobacco Use   Smoking status: Former    Types: Cigarettes    Quit date: 10/30/1985    Years since quitting: 34.9   Smokeless tobacco: Never  Vaping Use   Vaping Use: Never used  Substance and Sexual Activity   Alcohol use: No   Drug use: No   Sexual activity: Not Currently  Other Topics Concern   Not on file  Social History Narrative   Not on file   Social Determinants of Health   Financial Resource Strain: Not on file  Food Insecurity: Not on file  Transportation Needs: Not on file  Physical Activity: Not on file  Stress: Not on file  Social Connections: Not on file  Intimate Partner Violence: Not on file     Family History  Problem Relation Age of Onset   Colon cancer Mother      Review of Systems Positive for fatigue, palpitations Negative for: General:  chills, fever, night sweats or weight changes.  Cardiovascular:  PND orthopnea syncope dizziness  Dermatological skin lesions rashes Respiratory: Cough congestion Urologic: Frequent urination urination at night and hematuria Abdominal: negative for nausea, vomiting, diarrhea, bright red blood per rectum, melena, or hematemesis Neurologic: negative for visual changes, and/or hearing changes  All other systems reviewed and are otherwise negative except as noted above.  Labs: Recent Labs    09/28/20 1646  CKTOTAL 22*   Lab Results  Component Value Date   WBC 14.3 (H) 09/28/2020   HGB 15.2 (H) 09/28/2020   HCT 46.9 (H) 09/28/2020   MCV 90.5 09/28/2020   PLT 366 09/28/2020    Recent Labs  Lab 09/28/20 1420 09/28/20 1646  NA 140  --   K 3.1*  --   CL 104  --   CO2 24  --   BUN 17  --   CREATININE 0.78  --   CALCIUM 9.4  --   PROT  --  7.7  BILITOT  --  0.9  ALKPHOS  --  94  ALT  --  14  AST  --  19  GLUCOSE 116*  --    Lab Results  Component Value Date   CHOL 107 01/12/2020   HDL 46 01/12/2020   LDLCALC 44 01/12/2020   TRIG 84 01/12/2020   No results found for: DDIMER  Radiology/Studies:  DG Chest 1 View  Result Date: 09/12/2020 CLINICAL DATA:  Status post left thoracentesis. EXAM: CHEST  1 VIEW COMPARISON:  Same day. FINDINGS: No pneumothorax is noted status post left thoracentesis. Left pleural effusion is significantly smaller. IMPRESSION: No pneumothorax status post left-sided thoracentesis. Electronically Signed   By: Marijo Conception M.D.   On: 09/12/2020 15:36   DG Chest 1 View  Result Date: 09/10/2020 CLINICAL DATA:  87 year old female with a history pleural effusion EXAM: CHEST  1 VIEW COMPARISON:  09/09/2020 FINDINGS: Cardiomediastinal silhouette likely unchanged, partially obscured by overlying lung/pleural disease. Decreased opacification at the right lung base with improved clarity of the right hemidiaphragm status post thoracentesis. No pneumothorax. Opacity at the left lung base obscuring the left hemidiaphragm and  the left heart border is unchanged. Coarsened interstitial markings bilaterally. No significant interlobular septal thickening. IMPRESSION: No complicating features status post right-sided thoracentesis. Persisting left-sided pleural effusion, unchanged. Electronically Signed   By: Corrie Mckusick D.O.  On: 09/10/2020 14:52   DG Chest 2 View  Result Date: 09/12/2020 CLINICAL DATA:  CHF exacerbation EXAM: CHEST - 2 VIEW COMPARISON:  September 10, 2020 FINDINGS: The cardiomediastinal silhouette is obscured by a moderate left pleural effusion. This appears slightly decreased in volume compared to couple days ago. Improved perihilar opacities. No right pleural effusion. No pneumothorax. No new consolidative opacity. No acute osseous abnormality. IMPRESSION: Improved interstitial opacities with similar to slightly improved moderate left pleural effusion. Electronically Signed   By: Albin Felling M.D.   On: 09/12/2020 08:11   CT HEAD WO CONTRAST (5MM)  Result Date: 09/28/2020 CLINICAL DATA:  Atrial fibrillation. Taken off blood thinners due to operation. Back pain and generalized weakness for couple days. Tachycardic to 140. EXAM: CT HEAD WITHOUT CONTRAST CT CERVICAL SPINE WITHOUT CONTRAST TECHNIQUE: Multidetector CT imaging of the head and cervical spine was performed following the standard protocol without intravenous contrast. Multiplanar CT image reconstructions of the cervical spine were also generated. COMPARISON:  CT chest 03/28/2020. FINDINGS: CT HEAD FINDINGS BRAIN: BRAIN Prominence of the lateral ventricles may be related to central predominant atrophy, although a component of normal pressure/communicating hydrocephalus cannot be excluded. Patchy and confluent areas of decreased attenuation are noted throughout the deep and periventricular white matter of the cerebral hemispheres bilaterally, compatible with chronic microvascular ischemic disease. No evidence of large-territorial acute infarction. No  parenchymal hemorrhage. No mass lesion. No extra-axial collection. No mass effect or midline shift. No hydrocephalus. Basilar cisterns are patent. Vascular: No hyperdense vessel. Atherosclerotic calcifications are present within the cavernous internal carotid arteries. Skull: No acute fracture or focal lesion. Sinuses/Orbits: Paranasal sinuses and mastoid air cells are clear. Bilateral lens replacement. Otherwise orbits are unremarkable. Other: None. CT CERVICAL SPINE FINDINGS Alignment: Normal. Skull base and vertebrae: Amorphous calcification surrounding the C7 and T1 spinous processes (6:35) of unclear etiology but similar to prior CT chest. No acute fracture. No aggressive appearing focal osseous lesion or focal pathologic process. Soft tissues and spinal canal: No prevertebral fluid or swelling. No visible canal hematoma. Upper chest: Unremarkable. Other: A 0.8 cm hypodensity within the left thyroid gland. Not clinically significant; no follow-up imaging recommended (ref: J Am Coll Radiol. 2015 Feb;12(2): 143-50). IMPRESSION: 1. No acute intracranial abnormality. 2. Prominence of the lateral ventricles may be related to central predominant atrophy, although a component of normal pressure/communicating hydrocephalus cannot be excluded. 3. No acute displaced fracture or traumatic listhesis of the cervical spine. Electronically Signed   By: Iven Finn M.D.   On: 09/28/2020 17:58   CT Cervical Spine Wo Contrast  Result Date: 09/28/2020 CLINICAL DATA:  Atrial fibrillation. Taken off blood thinners due to operation. Back pain and generalized weakness for couple days. Tachycardic to 140. EXAM: CT HEAD WITHOUT CONTRAST CT CERVICAL SPINE WITHOUT CONTRAST TECHNIQUE: Multidetector CT imaging of the head and cervical spine was performed following the standard protocol without intravenous contrast. Multiplanar CT image reconstructions of the cervical spine were also generated. COMPARISON:  CT chest 03/28/2020.  FINDINGS: CT HEAD FINDINGS BRAIN: BRAIN Prominence of the lateral ventricles may be related to central predominant atrophy, although a component of normal pressure/communicating hydrocephalus cannot be excluded. Patchy and confluent areas of decreased attenuation are noted throughout the deep and periventricular white matter of the cerebral hemispheres bilaterally, compatible with chronic microvascular ischemic disease. No evidence of large-territorial acute infarction. No parenchymal hemorrhage. No mass lesion. No extra-axial collection. No mass effect or midline shift. No hydrocephalus. Basilar cisterns are patent. Vascular: No hyperdense  vessel. Atherosclerotic calcifications are present within the cavernous internal carotid arteries. Skull: No acute fracture or focal lesion. Sinuses/Orbits: Paranasal sinuses and mastoid air cells are clear. Bilateral lens replacement. Otherwise orbits are unremarkable. Other: None. CT CERVICAL SPINE FINDINGS Alignment: Normal. Skull base and vertebrae: Amorphous calcification surrounding the C7 and T1 spinous processes (6:35) of unclear etiology but similar to prior CT chest. No acute fracture. No aggressive appearing focal osseous lesion or focal pathologic process. Soft tissues and spinal canal: No prevertebral fluid or swelling. No visible canal hematoma. Upper chest: Unremarkable. Other: A 0.8 cm hypodensity within the left thyroid gland. Not clinically significant; no follow-up imaging recommended (ref: J Am Coll Radiol. 2015 Feb;12(2): 143-50). IMPRESSION: 1. No acute intracranial abnormality. 2. Prominence of the lateral ventricles may be related to central predominant atrophy, although a component of normal pressure/communicating hydrocephalus cannot be excluded. 3. No acute displaced fracture or traumatic listhesis of the cervical spine. Electronically Signed   By: Iven Finn M.D.   On: 09/28/2020 17:58   CT ABDOMEN PELVIS W CONTRAST  Result Date:  09/28/2020 CLINICAL DATA:  Abdominal pain, acute, nonlocalized. History of right hemicolectomy in July of 2022 for colon cancer EXAM: CT ABDOMEN AND PELVIS WITH CONTRAST TECHNIQUE: Multidetector CT imaging of the abdomen and pelvis was performed using the standard protocol following bolus administration of intravenous contrast. CONTRAST:  46m OMNIPAQUE IOHEXOL 350 MG/ML SOLN COMPARISON:  06/17/2020. FINDINGS: Lower chest: Moderate bilateral pleural effusions with associated compressive atelectasis of the bilateral lower lobes. Cardiomegaly. Coronary artery atherosclerosis. Hepatobiliary: 1.9 cm indeterminate density lesion within the right hepatic lobe not definitively seen on the previous study (series 2, image 18) numerous hepatic cysts are similar the prior study. Suspected gallbladder sludge. No hyperdense gallstone. No pericholecystic inflammatory changes by CT. No biliary dilatation. Pancreas: Unremarkable. No pancreatic ductal dilatation or surrounding inflammatory changes. Spleen: Normal in size without focal abnormality. Adrenals/Urinary Tract: Unremarkable adrenal glands. Kidneys enhance symmetrically. No renal stone or hydronephrosis. Mild diffuse urinary bladder wall thickening. Small bladder diverticula. Stomach/Bowel: Small hiatal hernia. Stomach appears otherwise within normal limits. No dilated loops of bowel. Interval postsurgical changes of a right hemicolectomy. Circumferential wall thickening within the low rectum. Scattered colonic diverticulosis. Moderate-large volume of stool throughout the colon. Vascular/Lymphatic: Extensive atherosclerosis throughout the aortoiliac axis. No abdominopelvic lymphadenopathy. Reproductive: Uterus and bilateral adnexa are unremarkable. Other: No free fluid. No abdominopelvic fluid collection. No pneumoperitoneum. No abdominal wall hernia. Musculoskeletal: Prior right hip ORIF. Healed right inferior pubic ramus fracture. There is a subtle superior endplate  compression fracture of L2 which is new from the previous CT with less than 10% vertebral body height loss. IMPRESSION: 1. Moderate bilateral pleural effusions with associated compressive atelectasis. 2. Indeterminate 1.9 cm density lesion within the right hepatic lobe not definitively seen on the previous study. Findings are concerning for hepatic metastatic disease given history of colon cancer. Further evaluation with non-emergent MRI of the abdomen is recommended. 3. Circumferential wall thickening within the low rectum may represent proctitis. 4. New subtle superior endplate compression fracture of L2 with less than 10% vertebral body height loss. 5. Moderate-large volume of stool throughout the colon. Interval right hemicolectomy. 6. Mild diffuse urinary bladder wall thickening with small bladder diverticula. Correlate with urinalysis to exclude cystitis. Aortic Atherosclerosis (ICD10-I70.0). Electronically Signed   By: NDavina PokeD.O.   On: 09/28/2020 16:57   DG Chest Port 1 View  Result Date: 09/28/2020 CLINICAL DATA:  Atrial fibrillation. Recently taken off of blood thinners.  Back pain and generalized pain. Tachycardic to 140. EXAM: PORTABLE CHEST 1 VIEW COMPARISON:  Chest x-ray 09/12/2020, CT chest 03/28/2020 FINDINGS: The heart and mediastinal contours are unchanged. Aortic calcification. No focal consolidation. Increased interstitial markings. Interval increase in size of bilateral small pleural effusions, left greater right. No pneumothorax. No acute osseous abnormality. IMPRESSION: Pulmonary edema with interval increase in size of bilateral small pleural effusions, left greater right. Electronically Signed   By: Iven Finn M.D.   On: 09/28/2020 15:21   DG Chest Portable 1 View  Result Date: 09/09/2020 CLINICAL DATA:  Weakness EXAM: PORTABLE CHEST 1 VIEW COMPARISON:  08/07/2020, CT 03/28/2020 FINDINGS: Small-moderate right and moderate to large left pleural effusion, increased  compared to prior. Cardiomegaly with vascular congestion and edema. Airspace disease at both bases. IMPRESSION: Cardiomegaly with vascular congestion and pulmonary edema. Moderate to large left and small moderate right pleural effusions with basilar airspace disease. Electronically Signed   By: Donavan Foil M.D.   On: 09/09/2020 21:57   US THORACENTESIS ASP PLEURAL SPACE W/IMG GUIDE  Result Date: 09/13/2020 INDICATION: Patient complains of shortness of breath with left pleural effusion request received for therapeutic thoracentesis. EXAM: ULTRASOUND GUIDED LEFT THORACENTESIS MEDICATIONS: Local 1% lidocaine only. COMPLICATIONS: None immediate. PROCEDURE: An ultrasound guided thoracentesis was thoroughly discussed with the patient and questions answered. The benefits, risks, alternatives and complications were also discussed. The patient understands and wishes to proceed with the procedure. Written consent was obtained. Ultrasound was performed to localize and mark an adequate pocket of fluid in the left chest. The area was then prepped and draped in the normal sterile fashion. 1% Lidocaine was used for local anesthesia. Under ultrasound guidance a 6 Fr Safe-T-Centesis catheter was introduced. Thoracentesis was performed. The catheter was removed and a dressing applied. FINDINGS: A total of approximately 500 mL of clear yellow fluid was removed. IMPRESSION: Successful ultrasound guided left thoracentesis yielding 500 mL of pleural fluid. Read By: Tsosie Billing PA-C Electronically Signed   By: Miachel Roux M.D.   On: 09/12/2020 15:18   US THORACENTESIS ASP PLEURAL SPACE W/IMG GUIDE  Result Date: 09/10/2020 INDICATION: Patient with a history of congestive heart failure and recurrent pleural effusions. Interventional radiology asked to perform a therapeutic and diagnostic thoracentesis. EXAM: ULTRASOUND GUIDED THORACENTESIS MEDICATIONS: 1% lidocaine 10 mL COMPLICATIONS: None immediate. PROCEDURE: An ultrasound  guided thoracentesis was thoroughly discussed with the patient and questions answered. The benefits, risks, alternatives and complications were also discussed. The patient understands and wishes to proceed with the procedure. Written consent was obtained. Ultrasound was performed to localize and mark an adequate pocket of fluid in the right chest. The area was then prepped and draped in the normal sterile fashion. 1% Lidocaine was used for local anesthesia. Under ultrasound guidance a 6 Fr Safe-T-Centesis catheter was introduced. Thoracentesis was performed. The catheter was removed and a dressing applied. FINDINGS: A total of approximately 750 mL of clear yellow fluid was removed. Samples were sent to the laboratory as requested by the clinical team. IMPRESSION: Successful ultrasound guided right thoracentesis yielding 750 mL of pleural fluid. Read by: Soyla Dryer, NP Electronically Signed   By: Corrie Mckusick D.O.   On: 09/10/2020 14:56    EKG: Atrial fibrillation with rapid ventricular response 141 bpm  Weights: Filed Weights   09/28/20 1419  Weight: 47 kg     Physical Exam: Blood pressure 137/86, pulse 96, temperature 98.5 F (36.9 C), temperature source Oral, resp. rate 20, height '5\' 3"'$  (1.6 m),  weight 47 kg, SpO2 97 %. Body mass index is 18.35 kg/m. General: Well developed, well nourished, in no acute distress. Head eyes ears nose throat: Normocephalic, atraumatic, sclera non-icteric, no xanthomas, nares are without discharge. No apparent thyromegaly and/or mass  Lungs: Normal respiratory effort.  no wheezes, no rales, no rhonchi.  Heart: Irregular rate and rhythm with normal S1 S2. no murmur gallop, no rub, PMI is normal size and placement, carotid upstroke normal without bruit, jugular venous pressure is normal Abdomen: Soft, non-tender, non-distended with normoactive bowel sounds. No hepatomegaly. No rebound/guarding. No obvious abdominal masses. Abdominal aorta is normal size without  bruit Extremities: Trace edema. no cyanosis, no clubbing, no ulcers  Peripheral : 2+ bilateral upper extremity pulses, 2+ bilateral femoral pulses, 2+ bilateral dorsal pedal pulse Neuro: Alert and oriented. No facial asymmetry. No focal deficit. Moves all extremities spontaneously. Musculoskeletal: Normal muscle tone without kyphosis Psych:  Responds to questions appropriately with a normal affect.    Assessment: 87 year old female with a past medical history of paroxysmal atrial fibrillation not currently on anticoagulation, hypertension, hyperlipidemia, acute on chronic systolic congestive heart failure with most recent echo showing moderately decreased LV systolic function with an EF of 30-35% likely tachycardia induced.  Patient admitted for possible sepsis with UTI, atrial fibrillation with RVR, CHF exacerbation.  Plan: -Amiodarone load and infusion for rhythm control and A. fib with RVR. Will consider transition to oral amiodarone tomorrow. -Continue diltiazem drip for heart rate control.  We will consider transition to oral CCB tomorrow -Begin Xarelto 20 mg for stroke risk reduction in atrial fibrillation and a CHA2DS2-VASc score of 5 and a creatinine clearance of greater than 60 -Continue 40 mg IV Lasix once daily and continue outpatient medications including Entresto and spironolactone.  Signed, Jettie Booze PA-C [facility] 09/29/2020, 8:48 AM The patient has been interviewed and examined. I agree with assessment and plan above. Serafina Royals MD [facility]

## 2020-09-29 NOTE — ED Notes (Signed)
Pt passed swallow eval per speech therapist.

## 2020-09-29 NOTE — ED Notes (Signed)
Central monitoring resumed

## 2020-09-29 NOTE — Evaluation (Signed)
Occupational Therapy Evaluation Patient Details Name: [name] [name] MRN: [id number] DOB: Oct 02, 1929 Today's Date: 09/29/2020    History of Present Illness [name] [name] is a 87 y.o. female with medical history significant of colon cancer status post colectomy not currently undergoing chemotherapy, HTN, HLD, chronic systolic CHF (EF 30 to [id number]), GERD, anxiety and some mild dementia. Presented with palpitations and back pain. No CP, no SOB, no cough or fever, no N/V/D. Son thinks she had a fall 5 or 6 days ago. Pt reports significant constipation.   Clinical Impression   [name] presents today with generalized weakness, limited endurance, and moderate back pain. She reports a fall at home last week, and a feeling of overwhelming fatigue since that time. She has a 2-story home with a stair lift installed. Her son has been living with her for the past year. He handles the shopping and most driving, although pt continues to drive short distances. Pt remains active, reporting that she and her son go out to dinner almost every night. She completes ADL INDly and uses a RW at home and rollator in the community. The above-mentioned fall is the only fall she recalls having in the previous 12 months. Pt is A&O x 4. She is able to transfer supine<sit with CGA, but reports increasing back pain w/ this movement. Per RN, no OOB activity until pt is TLSO brace in place -- currently awaiting delivery to ED. Pt does report, however, that getting into and out of bed has been increasingly difficult for her this past week. Recommend ongoing OT while pt is hospitalized, to assist pain with pain mgmt, fxl mobility, and return to PLOF. Recommend DC home with [facility]. Pt has all needed DME.     Follow Up Recommendations  Home health OT    Equipment Recommendations  None recommended by OT    Recommendations for Other Services       Precautions / Restrictions Precautions Precautions: Fall Precaution  Comments: Mod fall risk Required Braces or Orthoses: Spinal Brace Spinal Brace: Thoracolumbosacral orthotic Restrictions Weight Bearing Restrictions: No      Mobility Bed Mobility Overal bed mobility: Needs Assistance Bed Mobility: Supine to Sit     Supine to sit: Min guard     General bed mobility comments: Pt utilizes bed rails for assistance with HOB elevated.    Transfers                 General transfer comment: Deferred, per RN, no OOB activity until pt has TLSO brace    Balance Overall balance assessment: Needs assistance Sitting-balance support: Feet unsupported;Bilateral upper extremity supported Sitting balance-Leahy Scale: Good         Standing balance comment: deferred, pending delivery of back brace                           ADL either performed or assessed with clinical judgement   ADL                                               Vision         Perception     Praxis      Pertinent Vitals/Pain Pain Assessment:  (Pt reports pain when moving bed to sit upright. 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; Deaf in L ear)   Cognition Arousal/Alertness: Awake/alert Behavior During Therapy: WFL for tasks assessed/performed Overall Cognitive Status: Within Functional Limits for tasks assessed                                 General Comments: AOx4, pleasant   General Comments       Exercises Other Exercises Other Exercises: bed  mobility, educ re: role of OT, POC, DC recs, falls prevention   Shoulder Instructions      Home Living Family/patient expects to be discharged to:: Private residence Living Arrangements: Children Available Help at Discharge: Family;Available 24 hours/day   Home Access: Stairs to enter [employer] of Steps: 4 Entrance Stairs-Rails: Can reach both Home Layout: Multi-level;1/2 bath on main level;Bed/bath upstairs (has stair lift to reach 2nd floor)   Alternate Level Stairs-Rails: Can reach both Bathroom Shower/Tub: Walk-in shower;Tub/shower unit   Bathroom Toilet: Standard     Home Equipment: Environmental consultant - 2 wheels;Walker - 4 wheels;Cane - single point;Shower seat;Grab bars - tub/shower   Additional Comments: Pt states a chair lift is installed from basement entery to 2nd floor and does not need to climb stairs to enter the home.      Prior Functioning/Environment          Comments: Pt states mod-I with ambulation using 2-RW household distances and 4-RW (rollator) for community distances.        OT Problem List: Decreased strength;Decreased activity tolerance;Decreased range of motion;Impaired balance (sitting and/or standing);Pain      OT Treatment/Interventions:      OT Goals(Current goals can be found in the care plan section) Acute Rehab OT Goals Patient Stated Goal: To go home OT Goal Formulation: With patient Time For Goal Achievement: 10/13/20 Potential to Achieve Goals: Good  OT Frequency: Min 1X/week   Barriers to D/C:            Co-evaluation              AM-PAC OT "6 Clicks" Daily Activity     Outcome Measure Help from another person eating meals?: None Help from another person taking care of personal grooming?: A Little Help from another person toileting, which includes using toliet, bedpan, or urinal?: A Little Help from another person bathing (including washing, rinsing, drying)?: A Little Help from another person to put on and taking  off regular upper body clothing?: A Little Help from another person to put on and taking off regular lower body clothing?: A Little 6 Click Score: 19   End of Session    Activity Tolerance: Patient tolerated treatment well Patient left: in bed;with nursing/sitter in room;with call bell/phone within reach  OT Visit Diagnosis: Unsteadiness on feet (R26.81);History of falling (Z91.81);Muscle weakness (generalized) (M62.81)                Time: [id number] OT Time Calculation (min): 13 min Charges:  OT General Charges $OT Visit: 1 Visit OT Evaluation $OT Eval Low Complexity: 1 Low OT Treatments $Self Care/Home Management : 8-22 mins Josiah Lobo,  PhD, MS, OTR/L 09/29/20, 1:56 PM

## 2020-09-29 NOTE — Consult Note (Signed)
[place]  [phone] [phone]   Name: OKTOBER GLUECKERT Date: 09/29/2020 MRN: [id number]  DOB: 1929-10-25  Patient Care Team: [name], MD as PCP - General (Family Medicine) Clent Jacks, RN as Oncology Nurse Navigator [name], MD as Consulting Physician (Colon and Rectal Surgery) Lin Landsman, MD as Consulting Physician (Gastroenterology)    REASON FOR CONSULTATION: DEMETRI LAWRENSON is a 87 y.o. female with multiple medical problems including systolic cardiomyopathy with EF of 30 to 35% and history of stage III invasive adenocarcinoma of the colon status post hemicolectomy on 08/04/2020.  Patient was hospitalized 09/09/2020-09/12/2020 with CHF.  She is now readmitted 09/28/2020 with possible sepsis from UTI and A. fib with RVR.  Patient was incidentally found to have a new indeterminate liver mass on CT.  Palliative care was consulted to address goals  SOCIAL HISTORY:     reports that she quit smoking about 34 years ago. Her smoking use included cigarettes. She has never used smokeless tobacco. She reports that she does not drink alcohol and does not use drugs.  ADVANCE DIRECTIVES:  Not on file  CODE STATUS: DNR  PAST MEDICAL HISTORY: Past Medical History:  Diagnosis Date   Anemia    Anxiety    Cancer ([place])    Squamous Cell Carcinoma (top of head)   Colon cancer ([place])    Constipation, chronic    Deaf, left    Diverticular disease    GERD (gastroesophageal reflux disease)    Hearing aid worn    Hearing deficit    History of Bell's palsy    left side - resolved - 50 yrs ago   Hypertension    Internal hemorrhoids    Iron deficiency anemia    Mitral regurgitation    moderate to severe on 03/27/20 echo   Osteopenia    Pure hypercholesterolemia     PAST SURGICAL HISTORY:  Past Surgical History:  Procedure Laterality Date   CATARACT EXTRACTION W/PHACO Left  11/16/2015   Procedure: CATARACT EXTRACTION PHACO AND INTRAOCULAR LENS PLACEMENT ([place]);  Surgeon: Leandrew Koyanagi, MD;  Location: [place];  Service: Ophthalmology;  Laterality: Left;  TORIC   CATARACT EXTRACTION W/PHACO Right 12/07/2015   Procedure: CATARACT EXTRACTION PHACO AND INTRAOCULAR LENS PLACEMENT ([facility]);  Surgeon: Leandrew Koyanagi, MD;  Location: [place];  Service: Ophthalmology;  Laterality: Right;  TORIC   COLONOSCOPY WITH PROPOFOL N/A 08/10/2014   Procedure: COLONOSCOPY WITH PROPOFOL;  Surgeon: Lollie Sails, MD;  Location: [facility] ENDOSCOPY;  Service: Endoscopy;  Laterality: N/A;   COLONOSCOPY WITH PROPOFOL N/A 05/12/2020   Procedure: COLONOSCOPY WITH PROPOFOL;  Surgeon: Lin Landsman, MD;  Location: [facility] ENDOSCOPY;  Service: Gastroenterology;  Laterality: N/A;   ESOPHAGOGASTRODUODENOSCOPY (EGD) WITH PROPOFOL N/A 03/28/2020   Procedure: ESOPHAGOGASTRODUODENOSCOPY (EGD) WITH PROPOFOL;  Surgeon: Lucilla Lame, MD;  Location: [facility] ENDOSCOPY;  Service: Endoscopy;  Laterality: N/A;   EYE SURGERY     FEMUR FRACTURE SURGERY     FRACTURE SURGERY     GIVENS CAPSULE STUDY N/A 04/19/2020   Procedure: GIVENS CAPSULE STUDY;  Surgeon: Lin Landsman, MD;  Location: [facility] ENDOSCOPY;  Service: Gastroenterology;  Laterality: N/A;   LAPAROSCOPIC RIGHT HEMI COLECTOMY Right 08/04/2020   Procedure: LAPAROSCOPIC RIGHT HEMI COLECTOMY;  Surgeon: [name], MD;  Location: [facility];  Service: General;  Laterality: Right;   Nasal Polyp Removal      HEMATOLOGY/ONCOLOGY HISTORY:  Oncology History  Cancer  of ascending colon s/p lap colectomy 08/04/2020  06/01/2020 Initial Diagnosis   Cancer of ascending colon s/p lap colectomy 08/04/2020   08/24/2020 Cancer Staging   Staging form: Colon and Rectum, AJCC 8th Edition - Pathologic stage from 08/24/2020: Stage IIIC (pT4a, pN2a, cM0) - Signed by Sindy Guadeloupe, MD on 08/24/2020 Histologic grading system: 4 grade  system Histologic grade (G): G2 Residual tumor (R): R0 - None     ALLERGIES:  is allergic to metoprolol, bactrim [sulfamethoxazole-trimethoprim], ciprofloxacin, and pneumococcal vaccines.  MEDICATIONS:  Current Facility-Administered Medications  Medication Dose Route Frequency Provider Last Rate Last Admin   0.9 %  sodium chloride infusion  250 mL Intravenous PRN Doutova, Anastassia, MD       acetaminophen (TYLENOL) tablet 650 mg  650 mg Oral Q6H PRN Doutova, Anastassia, MD       Or   acetaminophen (TYLENOL) suppository 650 mg  650 mg Rectal Q6H PRN Toy Baker, MD       amiodarone (NEXTERONE PREMIX) 360-4.14 MG/200ML-% (1.8 mg/mL) IV infusion  30 mg/hr Intravenous Continuous Jettie Booze, PA-C 16.67 mL/hr at 09/29/20 1449 30 mg/hr at 09/29/20 1449   bisacodyl (DULCOLAX) suppository 10 mg  10 mg Rectal Daily PRN Toy Baker, MD       cefTRIAXone (ROCEPHIN) 1 g in sodium chloride 0.9 % 100 mL IVPB  1 g Intravenous Q24H Doutova, Anastassia, MD       diltiazem (CARDIZEM) 125 mg in dextrose 5% 125 mL (1 mg/mL) infusion  5-15 mg/hr Intravenous Continuous Doutova, Anastassia, MD 15 mL/hr at 09/29/20 1129 15 mg/hr at 09/29/20 1129   furosemide (LASIX) injection 40 mg  40 mg Intravenous Daily Toy Baker, MD   40 mg at 09/29/20 0454   HYDROcodone-acetaminophen (NORCO/VICODIN) 5-325 MG per tablet 1-2 tablet  1-2 tablet Oral Q4H PRN Doutova, Anastassia, MD       lidocaine (LIDODERM) 5 % 1 patch  1 patch Transdermal Q24H Doutova, Anastassia, MD       metroNIDAZOLE (FLAGYL) IVPB 500 mg  500 mg Intravenous Q8H Doutova, Anastassia, MD   Stopped at 09/29/20 1424   milk and molasses enema  1 enema Rectal Once Doutova, Anastassia, MD       pantoprazole (PROTONIX) EC tablet 40 mg  40 mg Oral BID AC Doutova, Anastassia, MD       polyethylene glycol (MIRALAX / GLYCOLAX) packet 17 g  17 g Oral Daily Doutova, Anastassia, MD   17 g at 09/29/20 0957   pravastatin (PRAVACHOL) tablet 20 mg   20 mg Oral q1800 Toy Baker, MD       Rivaroxaban (XARELTO) tablet 15 mg  15 mg Oral Q supper Tobin, Amanda, PA-C       sacubitril-valsartan (ENTRESTO) 24-26 mg per tablet  1 tablet Oral BID Toy Baker, MD   1 tablet at 09/29/20 1200   sodium chloride flush (NS) 0.9 % injection 3 mL  3 mL Intravenous Q12H Doutova, Anastassia, MD   3 mL at 09/29/20 1002   sodium chloride flush (NS) 0.9 % injection 3 mL  3 mL Intravenous PRN Doutova, Anastassia, MD       spironolactone (ALDACTONE) tablet 12.5 mg  12.5 mg Oral Daily Doutova, Anastassia, MD   12.5 mg at 09/29/20 1158   traMADol (ULTRAM) tablet 50 mg  50 mg Oral Q6H PRN Toy Baker, MD       Current Outpatient Medications  Medication Sig Dispense Refill   diltiazem (CARDIZEM) 60 MG tablet Take 60 mg by mouth  3 (three) times daily.     fluticasone (FLONASE) 50 MCG/ACT nasal spray Place 1 spray into both nostrils daily.     furosemide (LASIX) 40 MG tablet Take 1 tablet (40 mg total) by mouth daily. 30 tablet 1   iron polysaccharides (NIFEREX) 150 MG capsule Take 1 capsule (150 mg total) by mouth daily. 30 capsule 0   lovastatin (MEVACOR) 20 MG tablet Take 20 mg by mouth at bedtime.     pantoprazole (PROTONIX) 40 MG tablet Take 1 tablet (40 mg total) by mouth 2 (two) times daily before a meal. (Patient taking differently: Take 40 mg by mouth 2 (two) times daily as needed (acid reflux).) 60 tablet 2   Polyethyl Glycol-Propyl Glycol (SYSTANE OP) Place 1 drop into both eyes in the morning and at bedtime.     polyethylene glycol powder (GLYCOLAX/MIRALAX) 17 GM/SCOOP powder Take 17 g by mouth 2 (two) times daily as needed for moderate constipation.     sacubitril-valsartan (ENTRESTO) 24-26 MG Take 1 tablet by mouth 2 (two) times daily. 60 tablet 2   spironolactone (ALDACTONE) 25 MG tablet Take 0.5 tablets (12.5 mg total) by mouth daily. 15 tablet 2   traMADol (ULTRAM) 50 MG tablet Take 50 mg by mouth every 6 (six) hours as needed for  pain.     tretinoin (RETIN-A) 0.05 % cream Apply 1 application topically at bedtime.     calcium carbonate (TUMS - DOSED IN MG ELEMENTAL CALCIUM) 500 MG chewable tablet Chew 500 mg by mouth daily as needed for indigestion or heartburn.      VITAL SIGNS: BP (!) 114/56   Pulse 65   Temp 97.9 F (36.6 C) (Oral)   Resp 20   Ht '5\' 3"'$  (1.6 m)   Wt 103 lb 9.9 oz (47 kg)   SpO2 93%   BMI 18.35 kg/m  Filed Weights   09/28/20 1419  Weight: 103 lb 9.9 oz (47 kg)    Estimated body mass index is 18.35 kg/m as calculated from the following:   Height as of this encounter: '5\' 3"'$  (1.6 m).   Weight as of this encounter: 103 lb 9.9 oz (47 kg).  LABS: CBC:    Component Value Date/Time   WBC 9.2 09/29/2020 1153   HGB 15.0 09/29/2020 1153   HGB 10.0 (L) 06/04/2012 0510   HCT 46.7 (H) 09/29/2020 1153   HCT 28.8 (L) 06/04/2012 0510   PLT 349 09/29/2020 1153   PLT 170 06/04/2012 0510   MCV 90.2 09/29/2020 1153   MCV 90 06/04/2012 0510   NEUTROABS 7.0 09/29/2020 1153   NEUTROABS 7.3 (H) 06/04/2012 0510   LYMPHSABS 0.9 09/29/2020 1153   LYMPHSABS 2.0 06/04/2012 0510   MONOABS 1.0 09/29/2020 1153   MONOABS 1.9 (H) 06/04/2012 0510   EOSABS 0.1 09/29/2020 1153   EOSABS 0.3 06/04/2012 0510   BASOSABS 0.1 09/29/2020 1153   BASOSABS 0.1 06/04/2012 0510   Comprehensive Metabolic Panel:    Component Value Date/Time   NA 137 09/29/2020 1153   NA 137 07/08/2012 0633   K 3.8 09/29/2020 1153   K 4.1 07/08/2012 0633   CL 99 09/29/2020 1153   CL 102 07/08/2012 0633   CO2 28 09/29/2020 1153   CO2 31 07/08/2012 0633   BUN 17 09/29/2020 1153   BUN 16 07/08/2012 0633   CREATININE 0.83 09/29/2020 1153   CREATININE 0.73 07/08/2012 0633   GLUCOSE 150 (H) 09/29/2020 1153   GLUCOSE 74 07/08/2012 0633   CALCIUM 9.3  09/29/2020 1153   CALCIUM 9.5 07/08/2012 0633   AST 26 09/29/2020 1153   AST 24 05/31/2012 2019   ALT 14 09/29/2020 1153   ALT 26 05/31/2012 2019   ALKPHOS 100 09/29/2020 1153    ALKPHOS 52 05/31/2012 2019   BILITOT 0.8 09/29/2020 1153   BILITOT 0.4 05/31/2012 2019   PROT 8.3 (H) 09/29/2020 1153   PROT 8.1 05/31/2012 2019   ALBUMIN 3.5 09/29/2020 1153   ALBUMIN 4.0 05/31/2012 2019    RADIOGRAPHIC STUDIES: DG Chest 1 View  Result Date: 09/12/2020 CLINICAL DATA:  Status post left thoracentesis. EXAM: CHEST  1 VIEW COMPARISON:  Same day. FINDINGS: No pneumothorax is noted status post left thoracentesis. Left pleural effusion is significantly smaller. IMPRESSION: No pneumothorax status post left-sided thoracentesis. Electronically Signed   By: Marijo Conception M.D.   On: 09/12/2020 15:36   DG Chest 1 View  Result Date: 09/10/2020 CLINICAL DATA:  87 year old female with a history pleural effusion EXAM: CHEST  1 VIEW COMPARISON:  09/09/2020 FINDINGS: Cardiomediastinal silhouette likely unchanged, partially obscured by overlying lung/pleural disease. Decreased opacification at the right lung base with improved clarity of the right hemidiaphragm status post thoracentesis. No pneumothorax. Opacity at the left lung base obscuring the left hemidiaphragm and the left heart border is unchanged. Coarsened interstitial markings bilaterally. No significant interlobular septal thickening. IMPRESSION: No complicating features status post right-sided thoracentesis. Persisting left-sided pleural effusion, unchanged. Electronically Signed   By: Corrie Mckusick D.O.   On: 09/10/2020 14:52   DG Chest 2 View  Result Date: 09/12/2020 CLINICAL DATA:  CHF exacerbation EXAM: CHEST - 2 VIEW COMPARISON:  September 10, 2020 FINDINGS: The cardiomediastinal silhouette is obscured by a moderate left pleural effusion. This appears slightly decreased in volume compared to couple days ago. Improved perihilar opacities. No right pleural effusion. No pneumothorax. No new consolidative opacity. No acute osseous abnormality. IMPRESSION: Improved interstitial opacities with similar to slightly improved moderate left  pleural effusion. Electronically Signed   By: Albin Felling M.D.   On: 09/12/2020 08:11   CT HEAD WO CONTRAST (5MM)  Result Date: 09/28/2020 CLINICAL DATA:  Atrial fibrillation. Taken off blood thinners due to operation. Back pain and generalized weakness for couple days. Tachycardic to 140. EXAM: CT HEAD WITHOUT CONTRAST CT CERVICAL SPINE WITHOUT CONTRAST TECHNIQUE: Multidetector CT imaging of the head and cervical spine was performed following the standard protocol without intravenous contrast. Multiplanar CT image reconstructions of the cervical spine were also generated. COMPARISON:  CT chest 03/28/2020. FINDINGS: CT HEAD FINDINGS BRAIN: BRAIN Prominence of the lateral ventricles may be related to central predominant atrophy, although a component of normal pressure/communicating hydrocephalus cannot be excluded. Patchy and confluent areas of decreased attenuation are noted throughout the deep and periventricular white matter of the cerebral hemispheres bilaterally, compatible with chronic microvascular ischemic disease. No evidence of large-territorial acute infarction. No parenchymal hemorrhage. No mass lesion. No extra-axial collection. No mass effect or midline shift. No hydrocephalus. Basilar cisterns are patent. Vascular: No hyperdense vessel. Atherosclerotic calcifications are present within the cavernous internal carotid arteries. Skull: No acute fracture or focal lesion. Sinuses/Orbits: Paranasal sinuses and mastoid air cells are clear. Bilateral lens replacement. Otherwise orbits are unremarkable. Other: None. CT CERVICAL SPINE FINDINGS Alignment: Normal. Skull base and vertebrae: Amorphous calcification surrounding the C7 and T1 spinous processes (6:35) of unclear etiology but similar to prior CT chest. No acute fracture. No aggressive appearing focal osseous lesion or focal pathologic process. Soft tissues and spinal canal: No  prevertebral fluid or swelling. No visible canal hematoma. Upper  chest: Unremarkable. Other: A 0.8 cm hypodensity within the left thyroid gland. Not clinically significant; no follow-up imaging recommended (ref: J Am Coll Radiol. 2015 Feb;12(2): 143-50). IMPRESSION: 1. No acute intracranial abnormality. 2. Prominence of the lateral ventricles may be related to central predominant atrophy, although a component of normal pressure/communicating hydrocephalus cannot be excluded. 3. No acute displaced fracture or traumatic listhesis of the cervical spine. Electronically Signed   By: Iven Finn M.D.   On: 09/28/2020 17:58   CT Cervical Spine Wo Contrast  Result Date: 09/28/2020 CLINICAL DATA:  Atrial fibrillation. Taken off blood thinners due to operation. Back pain and generalized weakness for couple days. Tachycardic to 140. EXAM: CT HEAD WITHOUT CONTRAST CT CERVICAL SPINE WITHOUT CONTRAST TECHNIQUE: Multidetector CT imaging of the head and cervical spine was performed following the standard protocol without intravenous contrast. Multiplanar CT image reconstructions of the cervical spine were also generated. COMPARISON:  CT chest 03/28/2020. FINDINGS: CT HEAD FINDINGS BRAIN: BRAIN Prominence of the lateral ventricles may be related to central predominant atrophy, although a component of normal pressure/communicating hydrocephalus cannot be excluded. Patchy and confluent areas of decreased attenuation are noted throughout the deep and periventricular white matter of the cerebral hemispheres bilaterally, compatible with chronic microvascular ischemic disease. No evidence of large-territorial acute infarction. No parenchymal hemorrhage. No mass lesion. No extra-axial collection. No mass effect or midline shift. No hydrocephalus. Basilar cisterns are patent. Vascular: No hyperdense vessel. Atherosclerotic calcifications are present within the cavernous internal carotid arteries. Skull: No acute fracture or focal lesion. Sinuses/Orbits: Paranasal sinuses and mastoid air cells are  clear. Bilateral lens replacement. Otherwise orbits are unremarkable. Other: None. CT CERVICAL SPINE FINDINGS Alignment: Normal. Skull base and vertebrae: Amorphous calcification surrounding the C7 and T1 spinous processes (6:35) of unclear etiology but similar to prior CT chest. No acute fracture. No aggressive appearing focal osseous lesion or focal pathologic process. Soft tissues and spinal canal: No prevertebral fluid or swelling. No visible canal hematoma. Upper chest: Unremarkable. Other: A 0.8 cm hypodensity within the left thyroid gland. Not clinically significant; no follow-up imaging recommended (ref: J Am Coll Radiol. 2015 Feb;12(2): 143-50). IMPRESSION: 1. No acute intracranial abnormality. 2. Prominence of the lateral ventricles may be related to central predominant atrophy, although a component of normal pressure/communicating hydrocephalus cannot be excluded. 3. No acute displaced fracture or traumatic listhesis of the cervical spine. Electronically Signed   By: Iven Finn M.D.   On: 09/28/2020 17:58   CT ABDOMEN PELVIS W CONTRAST  Result Date: 09/28/2020 CLINICAL DATA:  Abdominal pain, acute, nonlocalized. History of right hemicolectomy in July of 2022 for colon cancer EXAM: CT ABDOMEN AND PELVIS WITH CONTRAST TECHNIQUE: Multidetector CT imaging of the abdomen and pelvis was performed using the standard protocol following bolus administration of intravenous contrast. CONTRAST:  42m OMNIPAQUE IOHEXOL 350 MG/ML SOLN COMPARISON:  06/17/2020. FINDINGS: Lower chest: Moderate bilateral pleural effusions with associated compressive atelectasis of the bilateral lower lobes. Cardiomegaly. Coronary artery atherosclerosis. Hepatobiliary: 1.9 cm indeterminate density lesion within the right hepatic lobe not definitively seen on the previous study (series 2, image 18) numerous hepatic cysts are similar the prior study. Suspected gallbladder sludge. No hyperdense gallstone. No pericholecystic  inflammatory changes by CT. No biliary dilatation. Pancreas: Unremarkable. No pancreatic ductal dilatation or surrounding inflammatory changes. Spleen: Normal in size without focal abnormality. Adrenals/Urinary Tract: Unremarkable adrenal glands. Kidneys enhance symmetrically. No renal stone or hydronephrosis. Mild diffuse urinary bladder  wall thickening. Small bladder diverticula. Stomach/Bowel: Small hiatal hernia. Stomach appears otherwise within normal limits. No dilated loops of bowel. Interval postsurgical changes of a right hemicolectomy. Circumferential wall thickening within the low rectum. Scattered colonic diverticulosis. Moderate-large volume of stool throughout the colon. Vascular/Lymphatic: Extensive atherosclerosis throughout the aortoiliac axis. No abdominopelvic lymphadenopathy. Reproductive: Uterus and bilateral adnexa are unremarkable. Other: No free fluid. No abdominopelvic fluid collection. No pneumoperitoneum. No abdominal wall hernia. Musculoskeletal: Prior right hip ORIF. Healed right inferior pubic ramus fracture. There is a subtle superior endplate compression fracture of L2 which is new from the previous CT with less than 10% vertebral body height loss. IMPRESSION: 1. Moderate bilateral pleural effusions with associated compressive atelectasis. 2. Indeterminate 1.9 cm density lesion within the right hepatic lobe not definitively seen on the previous study. Findings are concerning for hepatic metastatic disease given history of colon cancer. Further evaluation with non-emergent MRI of the abdomen is recommended. 3. Circumferential wall thickening within the low rectum may represent proctitis. 4. New subtle superior endplate compression fracture of L2 with less than 10% vertebral body height loss. 5. Moderate-large volume of stool throughout the colon. Interval right hemicolectomy. 6. Mild diffuse urinary bladder wall thickening with small bladder diverticula. Correlate with urinalysis to  exclude cystitis. Aortic Atherosclerosis (ICD10-I70.0). Electronically Signed   By: Davina Poke D.O.   On: 09/28/2020 16:57   DG Chest Port 1 View  Result Date: 09/28/2020 CLINICAL DATA:  Atrial fibrillation. Recently taken off of blood thinners. Back pain and generalized pain. Tachycardic to 140. EXAM: PORTABLE CHEST 1 VIEW COMPARISON:  Chest x-ray 09/12/2020, CT chest 03/28/2020 FINDINGS: The heart and mediastinal contours are unchanged. Aortic calcification. No focal consolidation. Increased interstitial markings. Interval increase in size of bilateral small pleural effusions, left greater right. No pneumothorax. No acute osseous abnormality. IMPRESSION: Pulmonary edema with interval increase in size of bilateral small pleural effusions, left greater right. Electronically Signed   By: Iven Finn M.D.   On: 09/28/2020 15:21   DG Chest Portable 1 View  Result Date: 09/09/2020 CLINICAL DATA:  Weakness EXAM: PORTABLE CHEST 1 VIEW COMPARISON:  08/07/2020, CT 03/28/2020 FINDINGS: Small-moderate right and moderate to large left pleural effusion, increased compared to prior. Cardiomegaly with vascular congestion and edema. Airspace disease at both bases. IMPRESSION: Cardiomegaly with vascular congestion and pulmonary edema. Moderate to large left and small moderate right pleural effusions with basilar airspace disease. Electronically Signed   By: Donavan Foil M.D.   On: 09/09/2020 21:57   US THORACENTESIS ASP PLEURAL SPACE W/IMG GUIDE  Result Date: 09/13/2020 INDICATION: Patient complains of shortness of breath with left pleural effusion request received for therapeutic thoracentesis. EXAM: ULTRASOUND GUIDED LEFT THORACENTESIS MEDICATIONS: Local 1% lidocaine only. COMPLICATIONS: None immediate. PROCEDURE: An ultrasound guided thoracentesis was thoroughly discussed with the patient and questions answered. The benefits, risks, alternatives and complications were also discussed. The patient  understands and wishes to proceed with the procedure. Written consent was obtained. Ultrasound was performed to localize and mark an adequate pocket of fluid in the left chest. The area was then prepped and draped in the normal sterile fashion. 1% Lidocaine was used for local anesthesia. Under ultrasound guidance a 6 Fr Safe-T-Centesis catheter was introduced. Thoracentesis was performed. The catheter was removed and a dressing applied. FINDINGS: A total of approximately 500 mL of clear yellow fluid was removed. IMPRESSION: Successful ultrasound guided left thoracentesis yielding 500 mL of pleural fluid. Read By: Tsosie Billing PA-C Electronically Signed   By: Sharen Heck  Mir M.D.   On: 09/12/2020 15:18   US THORACENTESIS ASP PLEURAL SPACE W/IMG GUIDE  Result Date: 09/10/2020 INDICATION: Patient with a history of congestive heart failure and recurrent pleural effusions. Interventional radiology asked to perform a therapeutic and diagnostic thoracentesis. EXAM: ULTRASOUND GUIDED THORACENTESIS MEDICATIONS: 1% lidocaine 10 mL COMPLICATIONS: None immediate. PROCEDURE: An ultrasound guided thoracentesis was thoroughly discussed with the patient and questions answered. The benefits, risks, alternatives and complications were also discussed. The patient understands and wishes to proceed with the procedure. Written consent was obtained. Ultrasound was performed to localize and mark an adequate pocket of fluid in the right chest. The area was then prepped and draped in the normal sterile fashion. 1% Lidocaine was used for local anesthesia. Under ultrasound guidance a 6 Fr Safe-T-Centesis catheter was introduced. Thoracentesis was performed. The catheter was removed and a dressing applied. FINDINGS: A total of approximately 750 mL of clear yellow fluid was removed. Samples were sent to the laboratory as requested by the clinical team. IMPRESSION: Successful ultrasound guided right thoracentesis yielding 750 mL of pleural  fluid. Read by: Soyla Dryer, NP Electronically Signed   By: Corrie Mckusick D.O.   On: 09/10/2020 14:56    PERFORMANCE STATUS (ECOG) : 3 - Symptomatic, >50% confined to bed  Review of Systems Unless otherwise noted, a complete review of systems is negative.  Physical Exam General: NAD Cardiovascular: Irregular Pulmonary: clear ant fields Abdomen: soft, nontender, + bowel sounds GU: no suprapubic tenderness Extremities: no edema, no joint deformities Skin: no rashes Neurological: Weakness but otherwise nonfocal  IMPRESSION: Patient seen in the ER.  Discussion of goals was significantly impaired by patient's difficulty hearing.  She requested that I call her son.  I called and spoke with patient's son.  He recognizes that there is a new finding of a possible liver mass.  We discussed oncology recommendation for outpatient work-up including MRI.  Son was in agreement with this plan.  He says that patient has been consistently adamant that she would not want to pursue chemotherapy and instead would want to focus on quality of life.  However, he does think the patient would agree to imaging and work-up if needed.  Son verbalized agreement with current scope of treatment.  Symptomatically, patient appears to be comfortable at present.  She has a DNR/DNI.  PLAN: -Continue current scope of treatment -Plan for outpatient MRI/work-up -DNR   Time Total: 60 minutes  Visit consisted of counseling and education dealing with the complex and emotionally intense issues of symptom management and palliative care in the setting of serious and potentially life-threatening illness.Greater than 50%  of this time was spent counseling and coordinating care related to the above assessment and plan.  Signed by: Altha Harm, PhD, NP-C

## 2020-09-29 NOTE — ED Notes (Signed)
Biomed to take bedside central monitoring equipment and attempt to fix, pt placed on bedside monitoring at this time.

## 2020-09-29 NOTE — Progress Notes (Signed)
Patient arrived to room 2A56 from ED.  Assessment complete, VS obtained, and Admission database began.

## 2020-09-29 NOTE — Evaluation (Signed)
Clinical/Bedside Swallow Evaluation Patient Details  Name: [name] [name] MRN: [id number] Date of Birth: February 02, 1929  Today's Date: 09/29/2020 Time: SLP Start Time (ACUTE ONLY): 66 SLP Stop Time (ACUTE ONLY): [id number] SLP Time Calculation (min) (ACUTE ONLY): 46 min  Past Medical History:  Past Medical History:  Diagnosis Date   Anemia    Anxiety    Cancer ([place])    Squamous Cell Carcinoma (top of head)   Colon cancer ([place])    Constipation, chronic    Deaf, left    Diverticular disease    GERD (gastroesophageal reflux disease)    Hearing aid worn    Hearing deficit    History of Bell's palsy    left side - resolved - 50 yrs ago   Hypertension    Internal hemorrhoids    Iron deficiency anemia    Mitral regurgitation    moderate to severe on 03/27/20 echo   Osteopenia    Pure hypercholesterolemia    Past Surgical History:  Past Surgical History:  Procedure Laterality Date   CATARACT EXTRACTION W/PHACO Left 11/16/2015   Procedure: CATARACT EXTRACTION PHACO AND INTRAOCULAR LENS PLACEMENT ([place]);  Surgeon: Leandrew Koyanagi, MD;  Location: [place];  Service: Ophthalmology;  Laterality: Left;  TORIC   CATARACT EXTRACTION W/PHACO Right 12/07/2015   Procedure: CATARACT EXTRACTION PHACO AND INTRAOCULAR LENS PLACEMENT ([facility]);  Surgeon: Leandrew Koyanagi, MD;  Location: [place];  Service: Ophthalmology;  Laterality: Right;  TORIC   COLONOSCOPY WITH PROPOFOL N/A 08/10/2014   Procedure: COLONOSCOPY WITH PROPOFOL;  Surgeon: Lollie Sails, MD;  Location[facility] ENDOSCOPY;  Service: Endoscopy;  Laterality: N/A;   COLONOSCOPY WITH PROPOFOL N/A 05/12/2020   Procedure: COLONOSCOPY WITH PROPOFOL;  Surgeon: Lin Landsman, MD;  Location: [facility] ENDOSCOPY;  Service: Gastroenterology;  Laterality: N/A;   ESOPHAGOGASTRODUODENOSCOPY (EGD) WITH PROPOFOL N/A 03/28/2020   Procedure: ESOPHAGOGASTRODUODENOSCOPY (EGD) WITH PROPOFOL;  Surgeon: Lucilla Lame, MD;  Location: [facility]  ENDOSCOPY;  Service: Endoscopy;  Laterality: N/A;   EYE SURGERY     FEMUR FRACTURE SURGERY     FRACTURE SURGERY     GIVENS CAPSULE STUDY N/A 04/19/2020   Procedure: GIVENS CAPSULE STUDY;  Surgeon: Lin Landsman, MD;  Location: [facility] ENDOSCOPY;  Service: Gastroenterology;  Laterality: N/A;   LAPAROSCOPIC RIGHT HEMI COLECTOMY Right 08/04/2020   Procedure: LAPAROSCOPIC RIGHT HEMI COLECTOMY;  Surgeon: [name], MD;  Location: [facility];  Service: General;  Laterality: Right;   Nasal Polyp Removal     HPI:  Per admitting H&P " [name] [name] is a 87 y.o. female with medical history significant of  colon cancer status post colectomy not currently undergoing chemotherapy, HTN, HLD, chronic systolic CHF  (EF 30 to [id number]), GERD, anxiety and some mild dementia         Presented with   palpitations and back pain no CP no SOB ,no cough or fever, no N/v/D   son who states that he thinks she fell 5 or 6 days ago   Pt reports significant constipation     Son states he tried to help his mom's pain with some pain medication he had at home.  It made her feel very woozy and she did not like it at all.  Advised son to never administer medications not prescribed for the patient as this could lead to severe side effects and/or death.  Son voiced understanding     Recently been admitted for CHF exacerbation diuresed discharged home on 15 August.  During  admission started on Entresto and Aldactone could not tolerate beta-blocker given a reported allergy.  Was seen in consult by cardiology.  Patient is supposed to be on Xarelto in the past but stopped taking it possibly secondary to fall risk.  Recently diagnosed with Klebsiella UTI treated with IV Rocephin and completed course with Keflex   "   Assessment / Plan / Recommendation Clinical Impression  Pt presents with functional swallowing abilities at bedside. Oral mech exam revealed structures to be functioning adequately with no apparent weakness. No overt s/s  of aspiration with thin liquids, purees, or solids. Mild oral transit delay with dry solids but min to no oral residue after the swallow. Vocal quality remained clear and laryngeal elevation appeared adequate. Rec trying meds whole with water as she reports this is the way she takes them at home. Continue with HH regular diet. No ST indicated at this time. please reconsult if further concerns. SLP Visit Diagnosis: Dysphagia, unspecified (R13.10)    Aspiration Risk  Mild aspiration risk;No limitations    Diet Recommendation Regular   Liquid Administration via: Cup;Straw Medication Administration: Whole meds with liquid Postural Changes: Seated upright at 90 degrees;Remain upright for at least 30 minutes after po intake    Other  Recommendations     Follow up Recommendations   NO ST needed     Frequency and Duration   N/A         Prognosis   Good     Swallow Study   General Date of Onset: 09/28/20 HPI: Per admitting H&P " [name] [name] is a 87 y.o. female with medical history significant of  colon cancer status post colectomy not currently undergoing chemotherapy, HTN, HLD, chronic systolic CHF  (EF 30 to [id number]), GERD, anxiety and some mild dementia         Presented with   palpitations and back pain no CP no SOB ,no cough or fever, no N/v/D   son who states that he thinks she fell 5 or 6 days ago   Pt reports significant constipation     Son states he tried to help his mom's pain with some pain medication he had at home.  It made her feel very woozy and she did not like it at all.  Advised son to never administer medications not prescribed for the patient as this could lead to severe side effects and/or death.  Son voiced understanding     Recently been admitted for CHF exacerbation diuresed discharged home on 15 August.  During admission started on Entresto and Aldactone could not tolerate beta-blocker given a reported allergy.  Was seen in consult by cardiology.  Patient is supposed  to be on Xarelto in the past but stopped taking it possibly secondary to fall risk.  Recently diagnosed with Klebsiella UTI treated with IV Rocephin and completed course with Keflex   " Type of Study: Bedside Swallow Evaluation Diet Prior to this Study: Regular Temperature Spikes Noted: No Respiratory Status: Room air History of Recent Intubation: No Behavior/Cognition: Alert;Cooperative;Pleasant mood Oral Cavity Assessment: Within Functional Limits Oral Care Completed by SLP: No Oral Cavity - Dentition: Adequate natural dentition Vision: Functional for self-feeding Self-Feeding Abilities: Able to feed self Patient Positioning: Upright in bed Baseline Vocal Quality: Normal    Oral/Motor/Sensory Function Overall Oral Motor/Sensory Function: Within functional limits   Ice Chips Ice chips: Not tested   Thin Liquid Thin Liquid: Within functional limits Presentation: Cup;Spoon;Straw    Nectar Thick Nectar Thick Liquid:  Not tested   Honey Thick Honey Thick Liquid: Not tested   Puree Puree: Within functional limits Presentation: Spoon   Solid     Solid: Within functional limits Presentation: Self Fed      Lucila Maine 09/29/2020,12:18 PM

## 2020-09-29 NOTE — ED Notes (Signed)
biomed at bedside to evaluate central monitoring device, disregard any  unvalidated vitals for this time period.

## 2020-09-29 NOTE — ED Notes (Signed)
Patient cleaned and new brief/purwick placed. New bedding

## 2020-09-29 NOTE — ED Notes (Signed)
Pt resting in ed stretcher with eyes closed, chest rise and fall observed, respirations regular/unlabored. Stretcher in low position with wheels locked, side rails up x2, call light in reach.

## 2020-09-29 NOTE — Progress Notes (Addendum)
Progress Note    [name] [name]  [id number] DOB: 08-01-1929  DOA: 09/28/2020 PCP: [name], MD      Brief Narrative:    Medical records reviewed and are as summarized below:  [name] [name] is a 87 y.o. female with medical history significant for colon cancer status post colectomy not currently undergoing chemotherapy, HTN, HLD, chronic systolic CHF  (EF 30 to [id number]), GERD, anxiety, dementia, recent hospitalization from 09/09/2020 through 09/12/2020 for CHF.  She was brought to the hospital because of palpitations, malaise, generalized weakness, back pain and rapid heart rate.  She was found to have atrial fibrillation with RVR, acute exacerbation of chronic systolic CHF, acute L2 compression fracture, constipation, suspected sepsis from UTI, suspected proctitis on CT abdomen pelvis, new 1.9 cm lesion in the right hepatic lobe concerning for metastatic disease.      Assessment/Plan:   Active Problems:   UTI (urinary tract infection)   HTN (hypertension)   Hypokalemia   HLD (hyperlipidemia)   Constipation, chronic   Cancer of ascending colon s/p lap colectomy 08/04/2020   GERD (gastroesophageal reflux disease)   Acute CHF (congestive heart failure) (HCC)   Atrial fibrillation with RVR (HCC)   Dementia without behavioral disturbance (HCC)   Proctitis   Sepsis ([place])    Body mass index is 18.35 kg/m.   Atrial fibrillation with RVR: She is on amiodarone drip with plans to transition to oral amiodarone tomorrow.  Taper off Cardizem infusion as able.  CHA2DS2-VASc score is 5.  She has been started on Xarelto for stroke prophylaxis.  Acute on chronic systolic CHF: Continue IV Lasix, Aldactone and Entresto. 2D echo in July 2022 showed EF estimated at 30 to 35%.  Acute L2 compression fracture: Continue analgesics as needed for pain.  PT and OT evaluation.  History of stage III colon cancer s/p colectomy, new 1.9 cm right hepatic lobe lesion concerning for  metastatic disease: Outpatient follow-up with oncologist recommended.  Abnormal urinalysis (pyuria), proctitis, suspected sepsis: Continue empiric IV antibiotics for now.  Follow-up urine and blood cultures.  Hypokalemia: Improved  Constipation: Treat with laxatives  Dementia: Continue supportive care  Diet Order             Diet Heart Room service appropriate? Yes; Fluid consistency: Thin  Diet effective now                      Consultants: Cardiologist  Procedures: None    Medications:    furosemide  40 mg Intravenous Daily   lidocaine  1 patch Transdermal Q24H   milk and molasses  1 enema Rectal Once   pantoprazole  40 mg Oral BID AC   polyethylene glycol  17 g Oral Daily   pravastatin  20 mg Oral q1800   Rivaroxaban  15 mg Oral Q supper   sacubitril-valsartan  1 tablet Oral BID   sodium chloride flush  3 mL Intravenous Q12H   spironolactone  12.5 mg Oral Daily   Continuous Infusions:  sodium chloride     amiodarone 60 mg/hr (09/29/20 0954)   Followed by   amiodarone     cefTRIAXone (ROCEPHIN)  IV     diltiazem (CARDIZEM) infusion 15 mg/hr (09/29/20 1129)   metronidazole Stopped (09/29/20 0551)     Anti-infectives (From admission, onward)    Start     Dose/Rate Route Frequency Ordered Stop   09/29/20 1800  cefTRIAXone (ROCEPHIN) 1 g in sodium chloride 0.9 %  100 mL IVPB        1 g 200 mL/hr over 30 Minutes Intravenous Every 24 hours 09/28/20 1923     09/28/20 2100  metroNIDAZOLE (FLAGYL) IVPB 500 mg        500 mg 100 mL/hr over 60 Minutes Intravenous Every 8 hours 09/28/20 2046     09/28/20 1715  cefTRIAXone (ROCEPHIN) 1 g in sodium chloride 0.9 % 100 mL IVPB        1 g 200 mL/hr over 30 Minutes Intravenous  Once 09/28/20 1704 09/28/20 1807              Family Communication/Anticipated D/C date and plan/Code Status   DVT prophylaxis: SCDs Start: 09/29/20 1002 Rivaroxaban (XARELTO) tablet 15 mg     Code Status: DNR  Family  Communication: None Disposition Plan:    Status is: Inpatient  Remains inpatient appropriate because:Inpatient level of care appropriate due to severity of illness  Dispo: The patient is from: Home              Anticipated d/c is to: Home              Patient currently is not medically stable to d/c.   Difficult to place patient No           Subjective:   C/o  low back pain pain.  No chest pain, shortness of breath abdominal pain, dysuria, vomiting.  Objective:    Vitals:   09/29/20 0700 09/29/20 0959 09/29/20 1127 09/29/20 1200  BP: 137/86 (!) 120/59 117/78 (!) 140/95  Pulse: 96 91 80 85  Resp: '20 20 20 17  '$ Temp:  97.9 F (36.6 C)    TempSrc:  Oral    SpO2: 97% 95% 92% 94%  Weight:      Height:       No data found.  No intake or output data in the 24 hours ending 09/29/20 1217 Filed Weights   09/28/20 1419  Weight: 47 kg    Exam:  GEN: NAD SKIN: Warm and dry.  Chronic erythema of bilateral legs. EYES: EOMI ENT: MMM.  Hard of hearing CV: Irregular rate and rhythm PULM: Bibasilar rales but no wheezing ABD: soft, ND, NT, +BS CNS: AAO x 3, non focal EXT: No edema or tenderness MSK: Lumbar spinal tenderness     Data Reviewed:   I have personally reviewed following labs and imaging studies:  Labs: Labs show the following:   Basic Metabolic Panel: Recent Labs  Lab 09/28/20 1420 09/28/20 1646 09/29/20 0651  NA 140  --  139  K 3.1*  --  3.5  CL 104  --  100  CO2 24  --  27  GLUCOSE 116*  --  108*  BUN 17  --  18  CREATININE 0.78  --  0.89  CALCIUM 9.4  --  9.1  MG 2.2  --  2.2  PHOS  --  3.6  --    GFR Estimated Creatinine Clearance: 31.8 mL/min (by C-G formula based on SCr of 0.89 mg/dL). Liver Function Tests: Recent Labs  Lab 09/28/20 1646  AST 19  ALT 14  ALKPHOS 94  BILITOT 0.9  PROT 7.7  ALBUMIN 3.3*   No results for input(s): LIPASE, AMYLASE in the last 168 hours. No results for input(s): AMMONIA in the last 168  hours. Coagulation profile Recent Labs  Lab 09/28/20 1420  INR 1.1    CBC: Recent Labs  Lab 09/28/20 1420  WBC 14.3*  HGB 15.2*  HCT 46.9*  MCV 90.5  PLT 366   Cardiac Enzymes: Recent Labs  Lab 09/28/20 1646  CKTOTAL 22*   BNP (last 3 results) No results for input(s): PROBNP in the last 8760 hours. CBG: No results for input(s): GLUCAP in the last 168 hours. D-Dimer: No results for input(s): DDIMER in the last 72 hours. Hgb A1c: No results for input(s): HGBA1C in the last 72 hours. Lipid Profile: No results for input(s): CHOL, HDL, LDLCALC, TRIG, CHOLHDL, LDLDIRECT in the last 72 hours. Thyroid function studies: Recent Labs    09/28/20 1420  TSH 1.657   Anemia work up: No results for input(s): VITAMINB12, FOLATE, FERRITIN, TIBC, IRON, RETICCTPCT in the last 72 hours. Sepsis Labs: Recent Labs  Lab 09/28/20 1420 09/28/20 1519  PROCALCITON <0.10  --   WBC 14.3*  --   LATICACIDVEN  --  1.7    Microbiology Recent Results (from the past 240 hour(s))  Resp Panel by RT-PCR (Flu A&B, Covid) Nasopharyngeal Swab     Status: None   Collection Time: 09/28/20  3:19 PM   Specimen: Nasopharyngeal Swab; Nasopharyngeal(NP) swabs in vial transport medium  Result Value Ref Range Status   SARS Coronavirus 2 by RT PCR NEGATIVE NEGATIVE Final    Comment: (NOTE) SARS-CoV-2 target nucleic acids are NOT DETECTED.  The SARS-CoV-2 RNA is generally detectable in upper respiratory specimens during the acute phase of infection. The lowest concentration of SARS-CoV-2 viral copies this assay can detect is 138 copies/mL. A negative result does not preclude SARS-Cov-2 infection and should not be used as the sole basis for treatment or other patient management decisions. A negative result may occur with  improper specimen collection/handling, submission of specimen other than nasopharyngeal swab, presence of viral mutation(s) within the areas targeted by this assay, and inadequate  number of viral copies(<138 copies/mL). A negative result must be combined with clinical observations, patient history, and epidemiological information. The expected result is Negative.  Fact Sheet for Patients:  EntrepreneurPulse.com.au  Fact Sheet for Healthcare Providers:  IncredibleEmployment.be  This test is no t yet approved or cleared by the [place] FDA and  has been authorized for detection and/or diagnosis of SARS-CoV-2 by FDA under an Emergency Use Authorization (EUA). This EUA will remain  in effect (meaning this test can be used) for the duration of the COVID-19 declaration under Section 564(b)(1) of the Act, 21 U.S.C.section 360bbb-3(b)(1), unless the authorization is terminated  or revoked sooner.       Influenza A by PCR NEGATIVE NEGATIVE Final   Influenza B by PCR NEGATIVE NEGATIVE Final    Comment: (NOTE) The Xpert Xpress SARS-CoV-2/FLU/RSV plus assay is intended as an aid in the diagnosis of influenza from Nasopharyngeal swab specimens and should not be used as a sole basis for treatment. Nasal washings and aspirates are unacceptable for Xpert Xpress SARS-CoV-2/FLU/RSV testing.  Fact Sheet for Patients: EntrepreneurPulse.com.au  Fact Sheet for Healthcare Providers: IncredibleEmployment.be  This test is not yet approved or cleared by the [place] FDA and has been authorized for detection and/or diagnosis of SARS-CoV-2 by FDA under an Emergency Use Authorization (EUA). This EUA will remain in effect (meaning this test can be used) for the duration of the COVID-19 declaration under Section 564(b)(1) of the Act, 21 U.S.C. section 360bbb-3(b)(1), unless the authorization is terminated or revoked.  Performed at [facility], [address] [place],  [address]   Blood culture (routine single)     Status: None (Preliminary  result)   Collection Time: 09/28/20   3:19 PM   Specimen: BLOOD  Result Value Ref Range Status   Specimen Description BLOOD LEFT ANTECUBITAL  Final   Special Requests   Final    BOTTLES DRAWN AEROBIC AND ANAEROBIC Blood Culture results may not be optimal due to an inadequate volume of blood received in culture bottles   Culture   Final    NO GROWTH < 24 HOURS Performed at [facility], [address], [place], [place] [address]    Report Status PENDING  Incomplete    Procedures and diagnostic studies:  CT HEAD WO CONTRAST (5MM)  Result Date: 09/28/2020 CLINICAL DATA:  Atrial fibrillation. Taken off blood thinners due to operation. Back pain and generalized weakness for couple days. Tachycardic to 140. EXAM: CT HEAD WITHOUT CONTRAST CT CERVICAL SPINE WITHOUT CONTRAST TECHNIQUE: Multidetector CT imaging of the head and cervical spine was performed following the standard protocol without intravenous contrast. Multiplanar CT image reconstructions of the cervical spine were also generated. COMPARISON:  CT chest 03/28/2020. FINDINGS: CT HEAD FINDINGS BRAIN: BRAIN Prominence of the lateral ventricles may be related to central predominant atrophy, although a component of normal pressure/communicating hydrocephalus cannot be excluded. Patchy and confluent areas of decreased attenuation are noted throughout the deep and periventricular white matter of the cerebral hemispheres bilaterally, compatible with chronic microvascular ischemic disease. No evidence of large-territorial acute infarction. No parenchymal hemorrhage. No mass lesion. No extra-axial collection. No mass effect or midline shift. No hydrocephalus. Basilar cisterns are patent. Vascular: No hyperdense vessel. Atherosclerotic calcifications are present within the cavernous internal carotid arteries. Skull: No acute fracture or focal lesion. Sinuses/Orbits: Paranasal sinuses and mastoid air cells are clear. Bilateral lens replacement. Otherwise orbits are unremarkable. Other:  None. CT CERVICAL SPINE FINDINGS Alignment: Normal. Skull base and vertebrae: Amorphous calcification surrounding the C7 and T1 spinous processes (6:35) of unclear etiology but similar to prior CT chest. No acute fracture. No aggressive appearing focal osseous lesion or focal pathologic process. Soft tissues and spinal canal: No prevertebral fluid or swelling. No visible canal hematoma. Upper chest: Unremarkable. Other: A 0.8 cm hypodensity within the left thyroid gland. Not clinically significant; no follow-up imaging recommended (ref: J Am Coll Radiol. 2015 Feb;12(2): 143-50). IMPRESSION: 1. No acute intracranial abnormality. 2. Prominence of the lateral ventricles may be related to central predominant atrophy, although a component of normal pressure/communicating hydrocephalus cannot be excluded. 3. No acute displaced fracture or traumatic listhesis of the cervical spine. Electronically Signed   By: Iven Finn M.D.   On: 09/28/2020 17:58   CT Cervical Spine Wo Contrast  Result Date: 09/28/2020 CLINICAL DATA:  Atrial fibrillation. Taken off blood thinners due to operation. Back pain and generalized weakness for couple days. Tachycardic to 140. EXAM: CT HEAD WITHOUT CONTRAST CT CERVICAL SPINE WITHOUT CONTRAST TECHNIQUE: Multidetector CT imaging of the head and cervical spine was performed following the standard protocol without intravenous contrast. Multiplanar CT image reconstructions of the cervical spine were also generated. COMPARISON:  CT chest 03/28/2020. FINDINGS: CT HEAD FINDINGS BRAIN: BRAIN Prominence of the lateral ventricles may be related to central predominant atrophy, although a component of normal pressure/communicating hydrocephalus cannot be excluded. Patchy and confluent areas of decreased attenuation are noted throughout the deep and periventricular white matter of the cerebral hemispheres bilaterally, compatible with chronic microvascular ischemic disease. No evidence of  large-territorial acute infarction. No parenchymal hemorrhage. No mass lesion. No extra-axial collection. No mass effect or midline shift. No hydrocephalus.  Basilar cisterns are patent. Vascular: No hyperdense vessel. Atherosclerotic calcifications are present within the cavernous internal carotid arteries. Skull: No acute fracture or focal lesion. Sinuses/Orbits: Paranasal sinuses and mastoid air cells are clear. Bilateral lens replacement. Otherwise orbits are unremarkable. Other: None. CT CERVICAL SPINE FINDINGS Alignment: Normal. Skull base and vertebrae: Amorphous calcification surrounding the C7 and T1 spinous processes (6:35) of unclear etiology but similar to prior CT chest. No acute fracture. No aggressive appearing focal osseous lesion or focal pathologic process. Soft tissues and spinal canal: No prevertebral fluid or swelling. No visible canal hematoma. Upper chest: Unremarkable. Other: A 0.8 cm hypodensity within the left thyroid gland. Not clinically significant; no follow-up imaging recommended (ref: J Am Coll Radiol. 2015 Feb;12(2): 143-50). IMPRESSION: 1. No acute intracranial abnormality. 2. Prominence of the lateral ventricles may be related to central predominant atrophy, although a component of normal pressure/communicating hydrocephalus cannot be excluded. 3. No acute displaced fracture or traumatic listhesis of the cervical spine. Electronically Signed   By: Iven Finn M.D.   On: 09/28/2020 17:58   CT ABDOMEN PELVIS W CONTRAST  Result Date: 09/28/2020 CLINICAL DATA:  Abdominal pain, acute, nonlocalized. History of right hemicolectomy in July of 2022 for colon cancer EXAM: CT ABDOMEN AND PELVIS WITH CONTRAST TECHNIQUE: Multidetector CT imaging of the abdomen and pelvis was performed using the standard protocol following bolus administration of intravenous contrast. CONTRAST:  59m OMNIPAQUE IOHEXOL 350 MG/ML SOLN COMPARISON:  06/17/2020. FINDINGS: Lower chest: Moderate bilateral  pleural effusions with associated compressive atelectasis of the bilateral lower lobes. Cardiomegaly. Coronary artery atherosclerosis. Hepatobiliary: 1.9 cm indeterminate density lesion within the right hepatic lobe not definitively seen on the previous study (series 2, image 18) numerous hepatic cysts are similar the prior study. Suspected gallbladder sludge. No hyperdense gallstone. No pericholecystic inflammatory changes by CT. No biliary dilatation. Pancreas: Unremarkable. No pancreatic ductal dilatation or surrounding inflammatory changes. Spleen: Normal in size without focal abnormality. Adrenals/Urinary Tract: Unremarkable adrenal glands. Kidneys enhance symmetrically. No renal stone or hydronephrosis. Mild diffuse urinary bladder wall thickening. Small bladder diverticula. Stomach/Bowel: Small hiatal hernia. Stomach appears otherwise within normal limits. No dilated loops of bowel. Interval postsurgical changes of a right hemicolectomy. Circumferential wall thickening within the low rectum. Scattered colonic diverticulosis. Moderate-large volume of stool throughout the colon. Vascular/Lymphatic: Extensive atherosclerosis throughout the aortoiliac axis. No abdominopelvic lymphadenopathy. Reproductive: Uterus and bilateral adnexa are unremarkable. Other: No free fluid. No abdominopelvic fluid collection. No pneumoperitoneum. No abdominal wall hernia. Musculoskeletal: Prior right hip ORIF. Healed right inferior pubic ramus fracture. There is a subtle superior endplate compression fracture of L2 which is new from the previous CT with less than 10% vertebral body height loss. IMPRESSION: 1. Moderate bilateral pleural effusions with associated compressive atelectasis. 2. Indeterminate 1.9 cm density lesion within the right hepatic lobe not definitively seen on the previous study. Findings are concerning for hepatic metastatic disease given history of colon cancer. Further evaluation with non-emergent MRI of the  abdomen is recommended. 3. Circumferential wall thickening within the low rectum may represent proctitis. 4. New subtle superior endplate compression fracture of L2 with less than 10% vertebral body height loss. 5. Moderate-large volume of stool throughout the colon. Interval right hemicolectomy. 6. Mild diffuse urinary bladder wall thickening with small bladder diverticula. Correlate with urinalysis to exclude cystitis. Aortic Atherosclerosis (ICD10-I70.0). Electronically Signed   By: NDavina PokeD.O.   On: 09/28/2020 16:57   DG Chest Port 1 View  Result Date: 09/28/2020 CLINICAL DATA:  Atrial  fibrillation. Recently taken off of blood thinners. Back pain and generalized pain. Tachycardic to 140. EXAM: PORTABLE CHEST 1 VIEW COMPARISON:  Chest x-ray 09/12/2020, CT chest 03/28/2020 FINDINGS: The heart and mediastinal contours are unchanged. Aortic calcification. No focal consolidation. Increased interstitial markings. Interval increase in size of bilateral small pleural effusions, left greater right. No pneumothorax. No acute osseous abnormality. IMPRESSION: Pulmonary edema with interval increase in size of bilateral small pleural effusions, left greater right. Electronically Signed   By: Iven Finn M.D.   On: 09/28/2020 15:21               LOS: 1 day   Kourtnee Lahey  [facility]   Pager on [email or web address]. If 7PM-7AM, please contact night-coverage at [email or web address]     09/29/2020, 12:17 PM

## 2020-09-29 NOTE — ED Notes (Signed)
Speech therapy at bedside to perform swallow eval.

## 2020-09-29 NOTE — ED Notes (Deleted)
biomed at bedside to evaluate central monitoring device, disregard any vitals for this time period.

## 2020-09-29 NOTE — ED Notes (Signed)
Pt ambulatory to restroom with staff assist x2 and use of walker, ambulated approximately 40f total. Assisted back to bed, continuous cardiac and pulse ox monitoring resumed. Purewick replaced with new device, new brief placed. Redness of sacram observed while assisting pt with use of toilet, mepilex dressing applied. Pt resting comfortably and denies further needs at this time, stretcher locked in low position with side rails up x2, call light in reach, high fall precautions in place.

## 2020-09-29 NOTE — ED Notes (Signed)
Anderson Malta RN aware of assigned bed

## 2020-09-30 DIAGNOSIS — E440 Moderate protein-calorie malnutrition: Secondary | ICD-10-CM | POA: Insufficient documentation

## 2020-09-30 LAB — MRSA NEXT GEN BY PCR, NASAL: MRSA by PCR Next Gen: NOT DETECTED

## 2020-09-30 LAB — BASIC METABOLIC PANEL
Anion gap: 7 (ref 5–15)
BUN: 19 mg/dL (ref 8–23)
CO2: 27 mmol/L (ref 22–32)
Calcium: 8.8 mg/dL — ABNORMAL LOW (ref 8.9–10.3)
Chloride: 104 mmol/L (ref 98–111)
Creatinine, Ser: 0.69 mg/dL (ref 0.44–1.00)
GFR, Estimated: >60 mL/min (ref >60)
Glucose, Bld: 104 mg/dL — ABNORMAL HIGH (ref 70–99)
Potassium: 3.2 mmol/L — ABNORMAL LOW (ref 3.5–5.1)
Sodium: 138 mmol/L (ref 135–145)

## 2020-09-30 LAB — MAGNESIUM: Magnesium: 2.1 mg/dL (ref 1.7–2.4)

## 2020-09-30 MED ORDER — ASCORBIC ACID 500 MG PO TABS
250.0000 mg | ORAL_TABLET | Freq: Two times a day (BID) | ORAL | Status: DC
  Administered 2020-09-30 – 2020-10-04 (×8): 250 mg via ORAL
  Filled 2020-09-30 (×8): qty 1

## 2020-09-30 MED ORDER — ADULT MULTIVITAMIN W/MINERALS CH
1.0000 | ORAL_TABLET | Freq: Every day | ORAL | Status: DC
  Administered 2020-10-01 – 2020-10-04 (×4): 1 via ORAL
  Filled 2020-09-30 (×4): qty 1

## 2020-09-30 MED ORDER — OXYCODONE HCL 5 MG PO TABS: 5.0000 mg | ORAL_TABLET | ORAL | Status: DC | PRN

## 2020-09-30 MED ORDER — BOOST / RESOURCE BREEZE PO LIQD CUSTOM
1.0000 | Freq: Three times a day (TID) | ORAL | Status: DC
  Administered 2020-09-30 – 2020-10-04 (×8): 1 via ORAL

## 2020-09-30 MED ORDER — ACETAMINOPHEN 500 MG PO TABS
500.0000 mg | ORAL_TABLET | Freq: Three times a day (TID) | ORAL | Status: DC
  Administered 2020-09-30 – 2020-10-04 (×12): 500 mg via ORAL
  Filled 2020-09-30 (×12): qty 1

## 2020-09-30 MED ORDER — DILTIAZEM HCL ER COATED BEADS 120 MG PO CP24
120.0000 mg | ORAL_CAPSULE | Freq: Every day | ORAL | Status: DC
  Administered 2020-09-30 – 2020-10-04 (×5): 120 mg via ORAL
  Filled 2020-09-30 (×5): qty 1

## 2020-09-30 MED ORDER — FLEET ENEMA 7-19 GM/118ML RE ENEM
1.0000 | ENEMA | Freq: Once | RECTAL | Status: AC
  Administered 2020-09-30: 1 via RECTAL

## 2020-09-30 MED ORDER — LACTULOSE 10 GM/15ML PO SOLN
20.0000 g | Freq: Once | ORAL | Status: AC
  Administered 2020-09-30: 20 g via ORAL
  Filled 2020-09-30: qty 30

## 2020-09-30 MED ORDER — SODIUM CHLORIDE 0.9 % IV SOLN
2.0000 g | Freq: Two times a day (BID) | INTRAVENOUS | Status: AC
  Administered 2020-09-30 – 2020-10-03 (×7): 2 g via INTRAVENOUS
  Filled 2020-09-30 (×9): qty 2

## 2020-09-30 MED ORDER — POTASSIUM CHLORIDE CRYS ER 20 MEQ PO TBCR
40.0000 meq | EXTENDED_RELEASE_TABLET | Freq: Two times a day (BID) | ORAL | Status: DC
  Administered 2020-09-30 – 2020-10-01 (×3): 40 meq via ORAL
  Filled 2020-09-30 (×3): qty 4

## 2020-09-30 MED ORDER — SENNOSIDES-DOCUSATE SODIUM 8.6-50 MG PO TABS
1.0000 | ORAL_TABLET | Freq: Two times a day (BID) | ORAL | Status: DC
  Administered 2020-09-30 – 2020-10-04 (×8): 1 via ORAL
  Filled 2020-09-30 (×8): qty 1

## 2020-09-30 MED ORDER — ACETAMINOPHEN 500 MG PO TABS
500.0000 mg | ORAL_TABLET | Freq: Four times a day (QID) | ORAL | Status: DC | PRN
  Administered 2020-10-03: 500 mg via ORAL
  Filled 2020-09-30: qty 1

## 2020-09-30 NOTE — Progress Notes (Signed)
Initial Nutrition Assessment  DOCUMENTATION CODES:   Non-severe (moderate) malnutrition in context of chronic illness  INTERVENTION:   Boost Breeze po TID, each supplement provides 250 kcal and 9 grams of protein  Magic cup TID with meals, each supplement provides 290 kcal and 9 grams of protein  MVI po daily   Vitamin C 234m po BID   Liberalize diet   Pt at high refeed risk; recommend monitor potassium, magnesium and phosphorus labs daily until stable  NUTRITION DIAGNOSIS:   Moderate Malnutrition related to cancer and cancer related treatments as evidenced by mild fat depletion, moderate fat depletion, mild muscle depletion, moderate muscle depletion.  GOAL:   Patient will meet greater than or equal to 90% of their needs  MONITOR:   PO intake, Supplement acceptance, Labs, Weight trends, I & O's, Skin  REASON FOR ASSESSMENT:   Consult Assessment of nutrition requirement/status  ASSESSMENT:   87y.o. female with medical history significant of HOH, colon cancer s/p lap colectomy 08/04/2020 not currently undergoing chemotherapy, HTN, HLD, chronic systolic CHF  (EF 30 to 384%, GERD, anxiety and some mild dementia who is admitted with Afib, sepsis and UTI.  Met with pt in room today. Pt is extremely hard of hearing but can ear a little from her right ear when speaking loudly. Pt reports good appetite and oral intake at baseline but reports that her appetite has been poor for several days. Pt's untouched lunch tray was sitting on her side table and when asked why she didn't eat pt reports "I want to die because I feel so bad." Pt reports that she does drink supplements at home but that she does not want anything right now. Pt prefers something orange flavored. Per chart, pt's son reports that pt eats well at times when she feels well. Pt documented to be eating 100% of meals during her last admission. Per chart, pt is down 16lbs(13%) over the past 8 months; this is significant  weight loss. RD will add supplements and MVI to help pt meet her estimated needs and to support wound healing. RD will also liberalize pt's diet as a heart healthy diet is restrictive of protein. Pt is likely at refeed risk.   Medications reviewed and include: lasix, protonix, miralax, aldactone  Labs reviewed: K 3.2(L), Mg 2.1 wnl P 4.1 wnl- 9/1  NUTRITION - FOCUSED PHYSICAL EXAM:  Flowsheet Row Most Recent Value  Orbital Region Moderate depletion  Upper Arm Region Mild depletion  Thoracic and Lumbar Region Mild depletion  Buccal Region Mild depletion  Temple Region Moderate depletion  Clavicle Bone Region Moderate depletion  Clavicle and Acromion Bone Region Moderate depletion  Scapular Bone Region Moderate depletion  Dorsal Hand Mild depletion  Patellar Region Moderate depletion  Anterior Thigh Region Moderate depletion  Posterior Calf Region Moderate depletion  Edema (RD Assessment) None  Hair Reviewed  Eyes Reviewed  Mouth Reviewed  Skin Reviewed  Nails Reviewed   Diet Order:   Diet Order             Diet 2 gram sodium Room service appropriate? Yes; Fluid consistency: Thin  Diet effective now                  EDUCATION NEEDS:   Education needs have been addressed  Skin:  Skin Assessment: Reviewed RN Assessment (Stage II coccyx)  Last BM:  9/2- type 6  Height:   Ht Readings from Last 1 Encounters:  09/29/20 _0  (1.6 m)  Weight:   Wt Readings from Last 1 Encounters:  09/29/20 49.5 kg    Ideal Body Weight:  52.3 kg  BMI:  Body mass index is 19.33 kg/m.  Estimated Nutritional Needs:   Kcal:  1300-1500kcal/day  Protein:  65-75g/day  Fluid:  1.3-1.5L/day  Koleen Distance MS, RD, LDN Please refer to [facility] for RD and/or RD on-call/weekend/after hours pager

## 2020-09-30 NOTE — Progress Notes (Signed)
[facility] Cardiology [facility] Encounter Note  Patient: [name] [name] / Admit Date: 09/28/2020 / Date of Encounter: 09/30/2020, 6:05 AM   Subjective: Patient has done very well overnight.  No evidence of anginal symptoms or congestive heart failure.  Atrial fibrillation with spontaneous conversion to normal sinus rhythm with amiodarone drip and diltiazem drip.  Now with improvements of symptoms overall.  Review of Systems: Positive for: Shortness of breath Negative for: Vision change, hearing change, syncope, dizziness, nausea, vomiting,diarrhea, bloody stool, stomach pain, cough, congestion, diaphoresis, urinary frequency, urinary pain,skin lesions, skin rashes Others previously listed  Objective: Telemetry: Normal sinus rhythm Physical Exam: Blood pressure 123/60, pulse 63, temperature 97.7 F (36.5 C), resp. rate 15, height '5\' 3"'$  (1.6 m), weight 49.5 kg, SpO2 92 %. Body mass index is 19.33 kg/m. General: Well developed, well nourished, in no acute distress. Head: Normocephalic, atraumatic, sclera non-icteric, no xanthomas, nares are without discharge. Neck: No apparent masses Lungs: Normal respirations with no wheezes, no rhonchi, no rales , few basilar crackles   Heart: Regular rate and rhythm, normal S1 S2, no murmur, no rub, no gallop, PMI is normal size and placement, carotid upstroke normal without bruit, jugular venous pressure normal Abdomen: Soft, non-tender, non-distended with normoactive bowel sounds. No hepatosplenomegaly. Abdominal aorta is normal size without bruit Extremities: No edema, no clubbing, no cyanosis, no ulcers,  Peripheral: 2+ radial, 2+ femoral, 2+ dorsal pedal pulses Neuro: Alert and oriented. Moves all extremities spontaneously. Psych:  Responds to questions appropriately with a normal affect.   Intake/Output Summary (Last 24 hours) at 09/30/2020 0605 Last data filed at 09/30/2020 0600 Gross per 24 hour  Intake 1457.78 ml  Output 450 ml  Net  1007.78 ml    Inpatient Medications:   diltiazem  120 mg Oral Daily   furosemide  40 mg Intravenous Daily   lidocaine  1 patch Transdermal Q24H   milk and molasses  1 enema Rectal Once   pantoprazole  40 mg Oral BID AC   polyethylene glycol  17 g Oral Daily   potassium chloride  40 mEq Oral Once   pravastatin  20 mg Oral q1800   Rivaroxaban  15 mg Oral Q supper   sacubitril-valsartan  1 tablet Oral BID   sodium chloride flush  3 mL Intravenous Q12H   spironolactone  12.5 mg Oral Daily   Infusions:   sodium chloride     amiodarone 30 mg/hr (09/29/20 2314)   cefTRIAXone (ROCEPHIN)  IV 200 mL/hr at 09/29/20 2045   metronidazole 500 mg (09/30/20 0559)    Labs: Recent Labs    09/28/20 1646 09/29/20 0651 09/29/20 1153  NA  --  139 137  K  --  3.5 3.8  CL  --  100 99  CO2  --  27 28  GLUCOSE  --  108* 150*  BUN  --  18 17  CREATININE  --  0.89 0.83  CALCIUM  --  9.1 9.3  MG  --  2.2 2.2  PHOS 3.6  --  4.1   Recent Labs    09/28/20 1646 09/29/20 1153  AST 19 26  ALT 14 14  ALKPHOS 94 100  BILITOT 0.9 0.8  PROT 7.7 8.3*  ALBUMIN 3.3* 3.5   Recent Labs    09/28/20 1420 09/29/20 1153  WBC 14.3* 9.2  NEUTROABS  --  7.0  HGB 15.2* 15.0  HCT 46.9* 46.7*  MCV 90.5 90.2  PLT 366 349   Recent Labs  09/28/20 1646  CKTOTAL 22*   Invalid input(s): POCBNP No results for input(s): HGBA1C in the last 72 hours.   Weights: Filed Weights   09/28/20 1419 09/29/20 2035  Weight: 47 kg 49.5 kg     Radiology/Studies:  DG Chest 1 View  Result Date: 09/12/2020 CLINICAL DATA:  Status post left thoracentesis. EXAM: CHEST  1 VIEW COMPARISON:  Same day. FINDINGS: No pneumothorax is noted status post left thoracentesis. Left pleural effusion is significantly smaller. IMPRESSION: No pneumothorax status post left-sided thoracentesis. Electronically Signed   By: Marijo Conception M.D.   On: 09/12/2020 15:36   DG Chest 1 View  Result Date: 09/10/2020 CLINICAL DATA:   87 year old female with a history pleural effusion EXAM: CHEST  1 VIEW COMPARISON:  09/09/2020 FINDINGS: Cardiomediastinal silhouette likely unchanged, partially obscured by overlying lung/pleural disease. Decreased opacification at the right lung base with improved clarity of the right hemidiaphragm status post thoracentesis. No pneumothorax. Opacity at the left lung base obscuring the left hemidiaphragm and the left heart border is unchanged. Coarsened interstitial markings bilaterally. No significant interlobular septal thickening. IMPRESSION: No complicating features status post right-sided thoracentesis. Persisting left-sided pleural effusion, unchanged. Electronically Signed   By: Corrie Mckusick D.O.   On: 09/10/2020 14:52   DG Chest 2 View  Result Date: 09/12/2020 CLINICAL DATA:  CHF exacerbation EXAM: CHEST - 2 VIEW COMPARISON:  September 10, 2020 FINDINGS: The cardiomediastinal silhouette is obscured by a moderate left pleural effusion. This appears slightly decreased in volume compared to couple days ago. Improved perihilar opacities. No right pleural effusion. No pneumothorax. No new consolidative opacity. No acute osseous abnormality. IMPRESSION: Improved interstitial opacities with similar to slightly improved moderate left pleural effusion. Electronically Signed   By: Albin Felling M.D.   On: 09/12/2020 08:11   CT HEAD WO CONTRAST (5MM)  Result Date: 09/28/2020 CLINICAL DATA:  Atrial fibrillation. Taken off blood thinners due to operation. Back pain and generalized weakness for couple days. Tachycardic to 140. EXAM: CT HEAD WITHOUT CONTRAST CT CERVICAL SPINE WITHOUT CONTRAST TECHNIQUE: Multidetector CT imaging of the head and cervical spine was performed following the standard protocol without intravenous contrast. Multiplanar CT image reconstructions of the cervical spine were also generated. COMPARISON:  CT chest 03/28/2020. FINDINGS: CT HEAD FINDINGS BRAIN: BRAIN Prominence of the lateral  ventricles may be related to central predominant atrophy, although a component of normal pressure/communicating hydrocephalus cannot be excluded. Patchy and confluent areas of decreased attenuation are noted throughout the deep and periventricular white matter of the cerebral hemispheres bilaterally, compatible with chronic microvascular ischemic disease. No evidence of large-territorial acute infarction. No parenchymal hemorrhage. No mass lesion. No extra-axial collection. No mass effect or midline shift. No hydrocephalus. Basilar cisterns are patent. Vascular: No hyperdense vessel. Atherosclerotic calcifications are present within the cavernous internal carotid arteries. Skull: No acute fracture or focal lesion. Sinuses/Orbits: Paranasal sinuses and mastoid air cells are clear. Bilateral lens replacement. Otherwise orbits are unremarkable. Other: None. CT CERVICAL SPINE FINDINGS Alignment: Normal. Skull base and vertebrae: Amorphous calcification surrounding the C7 and T1 spinous processes (6:35) of unclear etiology but similar to prior CT chest. No acute fracture. No aggressive appearing focal osseous lesion or focal pathologic process. Soft tissues and spinal canal: No prevertebral fluid or swelling. No visible canal hematoma. Upper chest: Unremarkable. Other: A 0.8 cm hypodensity within the left thyroid gland. Not clinically significant; no follow-up imaging recommended (ref: J Am Coll Radiol. 2015 Feb;12(2): 143-50). IMPRESSION: 1. No acute intracranial abnormality. 2.  Prominence of the lateral ventricles may be related to central predominant atrophy, although a component of normal pressure/communicating hydrocephalus cannot be excluded. 3. No acute displaced fracture or traumatic listhesis of the cervical spine. Electronically Signed   By: Iven Finn M.D.   On: 09/28/2020 17:58   CT Cervical Spine Wo Contrast  Result Date: 09/28/2020 CLINICAL DATA:  Atrial fibrillation. Taken off blood thinners due  to operation. Back pain and generalized weakness for couple days. Tachycardic to 140. EXAM: CT HEAD WITHOUT CONTRAST CT CERVICAL SPINE WITHOUT CONTRAST TECHNIQUE: Multidetector CT imaging of the head and cervical spine was performed following the standard protocol without intravenous contrast. Multiplanar CT image reconstructions of the cervical spine were also generated. COMPARISON:  CT chest 03/28/2020. FINDINGS: CT HEAD FINDINGS BRAIN: BRAIN Prominence of the lateral ventricles may be related to central predominant atrophy, although a component of normal pressure/communicating hydrocephalus cannot be excluded. Patchy and confluent areas of decreased attenuation are noted throughout the deep and periventricular white matter of the cerebral hemispheres bilaterally, compatible with chronic microvascular ischemic disease. No evidence of large-territorial acute infarction. No parenchymal hemorrhage. No mass lesion. No extra-axial collection. No mass effect or midline shift. No hydrocephalus. Basilar cisterns are patent. Vascular: No hyperdense vessel. Atherosclerotic calcifications are present within the cavernous internal carotid arteries. Skull: No acute fracture or focal lesion. Sinuses/Orbits: Paranasal sinuses and mastoid air cells are clear. Bilateral lens replacement. Otherwise orbits are unremarkable. Other: None. CT CERVICAL SPINE FINDINGS Alignment: Normal. Skull base and vertebrae: Amorphous calcification surrounding the C7 and T1 spinous processes (6:35) of unclear etiology but similar to prior CT chest. No acute fracture. No aggressive appearing focal osseous lesion or focal pathologic process. Soft tissues and spinal canal: No prevertebral fluid or swelling. No visible canal hematoma. Upper chest: Unremarkable. Other: A 0.8 cm hypodensity within the left thyroid gland. Not clinically significant; no follow-up imaging recommended (ref: J Am Coll Radiol. 2015 Feb;12(2): 143-50). IMPRESSION: 1. No acute  intracranial abnormality. 2. Prominence of the lateral ventricles may be related to central predominant atrophy, although a component of normal pressure/communicating hydrocephalus cannot be excluded. 3. No acute displaced fracture or traumatic listhesis of the cervical spine. Electronically Signed   By: Iven Finn M.D.   On: 09/28/2020 17:58   CT ABDOMEN PELVIS W CONTRAST  Result Date: 09/28/2020 CLINICAL DATA:  Abdominal pain, acute, nonlocalized. History of right hemicolectomy in July of 2022 for colon cancer EXAM: CT ABDOMEN AND PELVIS WITH CONTRAST TECHNIQUE: Multidetector CT imaging of the abdomen and pelvis was performed using the standard protocol following bolus administration of intravenous contrast. CONTRAST:  40m OMNIPAQUE IOHEXOL 350 MG/ML SOLN COMPARISON:  06/17/2020. FINDINGS: Lower chest: Moderate bilateral pleural effusions with associated compressive atelectasis of the bilateral lower lobes. Cardiomegaly. Coronary artery atherosclerosis. Hepatobiliary: 1.9 cm indeterminate density lesion within the right hepatic lobe not definitively seen on the previous study (series 2, image 18) numerous hepatic cysts are similar the prior study. Suspected gallbladder sludge. No hyperdense gallstone. No pericholecystic inflammatory changes by CT. No biliary dilatation. Pancreas: Unremarkable. No pancreatic ductal dilatation or surrounding inflammatory changes. Spleen: Normal in size without focal abnormality. Adrenals/Urinary Tract: Unremarkable adrenal glands. Kidneys enhance symmetrically. No renal stone or hydronephrosis. Mild diffuse urinary bladder wall thickening. Small bladder diverticula. Stomach/Bowel: Small hiatal hernia. Stomach appears otherwise within normal limits. No dilated loops of bowel. Interval postsurgical changes of a right hemicolectomy. Circumferential wall thickening within the low rectum. Scattered colonic diverticulosis. Moderate-large volume of stool throughout the colon.  Vascular/Lymphatic: Extensive atherosclerosis throughout the aortoiliac axis. No abdominopelvic lymphadenopathy. Reproductive: Uterus and bilateral adnexa are unremarkable. Other: No free fluid. No abdominopelvic fluid collection. No pneumoperitoneum. No abdominal wall hernia. Musculoskeletal: Prior right hip ORIF. Healed right inferior pubic ramus fracture. There is a subtle superior endplate compression fracture of L2 which is new from the previous CT with less than 10% vertebral body height loss. IMPRESSION: 1. Moderate bilateral pleural effusions with associated compressive atelectasis. 2. Indeterminate 1.9 cm density lesion within the right hepatic lobe not definitively seen on the previous study. Findings are concerning for hepatic metastatic disease given history of colon cancer. Further evaluation with non-emergent MRI of the abdomen is recommended. 3. Circumferential wall thickening within the low rectum may represent proctitis. 4. New subtle superior endplate compression fracture of L2 with less than 10% vertebral body height loss. 5. Moderate-large volume of stool throughout the colon. Interval right hemicolectomy. 6. Mild diffuse urinary bladder wall thickening with small bladder diverticula. Correlate with urinalysis to exclude cystitis. Aortic Atherosclerosis (ICD10-I70.0). Electronically Signed   By: Davina Poke D.O.   On: 09/28/2020 16:57   DG Chest Port 1 View  Result Date: 09/28/2020 CLINICAL DATA:  Atrial fibrillation. Recently taken off of blood thinners. Back pain and generalized pain. Tachycardic to 140. EXAM: PORTABLE CHEST 1 VIEW COMPARISON:  Chest x-ray 09/12/2020, CT chest 03/28/2020 FINDINGS: The heart and mediastinal contours are unchanged. Aortic calcification. No focal consolidation. Increased interstitial markings. Interval increase in size of bilateral small pleural effusions, left greater right. No pneumothorax. No acute osseous abnormality. IMPRESSION: Pulmonary edema with  interval increase in size of bilateral small pleural effusions, left greater right. Electronically Signed   By: Iven Finn M.D.   On: 09/28/2020 15:21   DG Chest Portable 1 View  Result Date: 09/09/2020 CLINICAL DATA:  Weakness EXAM: PORTABLE CHEST 1 VIEW COMPARISON:  08/07/2020, CT 03/28/2020 FINDINGS: Small-moderate right and moderate to large left pleural effusion, increased compared to prior. Cardiomegaly with vascular congestion and edema. Airspace disease at both bases. IMPRESSION: Cardiomegaly with vascular congestion and pulmonary edema. Moderate to large left and small moderate right pleural effusions with basilar airspace disease. Electronically Signed   By: Donavan Foil M.D.   On: 09/09/2020 21:57   US THORACENTESIS ASP PLEURAL SPACE W/IMG GUIDE  Result Date: 09/13/2020 INDICATION: Patient complains of shortness of breath with left pleural effusion request received for therapeutic thoracentesis. EXAM: ULTRASOUND GUIDED LEFT THORACENTESIS MEDICATIONS: Local 1% lidocaine only. COMPLICATIONS: None immediate. PROCEDURE: An ultrasound guided thoracentesis was thoroughly discussed with the patient and questions answered. The benefits, risks, alternatives and complications were also discussed. The patient understands and wishes to proceed with the procedure. Written consent was obtained. Ultrasound was performed to localize and mark an adequate pocket of fluid in the left chest. The area was then prepped and draped in the normal sterile fashion. 1% Lidocaine was used for local anesthesia. Under ultrasound guidance a 6 Fr Safe-T-Centesis catheter was introduced. Thoracentesis was performed. The catheter was removed and a dressing applied. FINDINGS: A total of approximately 500 mL of clear yellow fluid was removed. IMPRESSION: Successful ultrasound guided left thoracentesis yielding 500 mL of pleural fluid. Read By: Tsosie Billing PA-C Electronically Signed   By: Miachel Roux M.D.   On: 09/12/2020  15:18   US THORACENTESIS ASP PLEURAL SPACE W/IMG GUIDE  Result Date: 09/10/2020 INDICATION: Patient with a history of congestive heart failure and recurrent pleural effusions. Interventional radiology asked to perform a therapeutic and diagnostic thoracentesis.  EXAM: ULTRASOUND GUIDED THORACENTESIS MEDICATIONS: 1% lidocaine 10 mL COMPLICATIONS: None immediate. PROCEDURE: An ultrasound guided thoracentesis was thoroughly discussed with the patient and questions answered. The benefits, risks, alternatives and complications were also discussed. The patient understands and wishes to proceed with the procedure. Written consent was obtained. Ultrasound was performed to localize and mark an adequate pocket of fluid in the right chest. The area was then prepped and draped in the normal sterile fashion. 1% Lidocaine was used for local anesthesia. Under ultrasound guidance a 6 Fr Safe-T-Centesis catheter was introduced. Thoracentesis was performed. The catheter was removed and a dressing applied. FINDINGS: A total of approximately 750 mL of clear yellow fluid was removed. Samples were sent to the laboratory as requested by the clinical team. IMPRESSION: Successful ultrasound guided right thoracentesis yielding 750 mL of pleural fluid. Read by: Soyla Dryer, NP Electronically Signed   By: Corrie Mckusick D.O.   On: 09/10/2020 14:56     Assessment and Recommendation  87 y.o. female with known paroxysmal nonvalvular atrial fibrillation previously controlled with diltiazem metoprolol amiodarone combination although has some difficulty with metoprolol in the past.  She does have chronic systolic dysfunction congestive heart failure and no current evidence of acute coronary syndrome. 1.  Continuation of amiodarone drip for further amiodarone load throughout today and switch to oral amiodarone 200 mg twice per day.  She will eventually decreased to 200 mg once per day as outpatient 2.  Reinstatement of diltiazem orally at  120 mg each day for maintenance of normal sinus rhythm and heart rate control and will avoid beta-blocker due to significant intolerance in the past 3.  No further cardiac diagnostics necessary at this time 4.  Change Lasix to 40 mg p.o. daily after intravenous dose this a.m. for treatment systolic dysfunction congestive heart failure 5.  Anticoagulation with Xarelto as before without change 6.  Begin ambulation and follow-up for improvements as per above and discharged home from cardiac standpoint if improvements continue as per above 7.  Dr.Kahn to cover for Saturday if any questions  Signed, Serafina Royals M.D. FACC

## 2020-09-30 NOTE — Progress Notes (Signed)
[name] [name]  [id number] DOB: Nov 03, 1929 DOA: 09/28/2020 PCP: [name], MD    Brief Narrative:  87 year old with a history of colon cancer status post colectomy, HTN, HLD, chronic systolic CHF (EF 99991111), GERD, anxiety, and dementia who was brought to the ER with palpitations malaise generalized weakness and back pain.  Significant Events:  In the ER she was found to be in A. fib with RVR.  She was also diagnosed with an acute L2 compression fracture, constipation, UTI, and possible proctitis as noted on a CT abdomen pelvis.  Consultants:  Oncology Cardiology  Code Status: NO CODE BLUE  Antimicrobials:  Rocephin 8/31 > Flagyl 8/31 >  DVT prophylaxis: Xarelto  Subjective: Tells me she hurts all over.  Appears confused.  Cannot provide a reliable history.  In no clear respiratory distress.  Assessment & Plan:  Atrial fibrillation with acute RVR Has required amiodarone drip as well as Cardizem infusion -CHA2DS2-VASc is 5 -continue Xarelto-has a history of beta-blocker intolerance -rate presently well controlled  Acute exacerbation of chronic systolic CHF EF [id number] via TTE July 2022 -continue IV Lasix, Aldactone, Entresto -appears compensated at present time  Acute L2 compression fracture Continue pain management - PT/OT - TLSO brace when attempting to ambulate   Stage III colon cancer status post colectomy New 1.9 cm right hepatic lobe lesion noted this admission - Oncology has evaluated  Pseudomonas UTI - possible proctitis Antibiotic adjusted - f/u sensitivities   Hypokalemia Due to poor intake -supplement and follow -magnesium is normal  Constipation Continue bowel regimen  Dementia Complicates effective treatment and communication  Family Communication: No family present at time of exam Status is: Inpatient  Remains inpatient appropriate because:Inpatient level of care appropriate due to severity of illness  Dispo: The patient is from:  Home              Anticipated d/c is to:  Unclear              Patient currently is not medically stable to d/c.   Difficult to place patient No   Objective: Blood pressure 130/63, pulse 64, temperature 97.8 F (36.6 C), temperature source Oral, resp. rate 19, height '5\' 3"'$  (1.6 m), weight 49.5 kg, SpO2 94 %.  Intake/Output Summary (Last 24 hours) at 09/30/2020 1045 Last data filed at 09/30/2020 0749 Gross per 24 hour  Intake 1457.78 ml  Output 450 ml  Net 1007.78 ml   Filed Weights   09/28/20 1419 09/29/20 2035  Weight: 47 kg 49.5 kg    Examination: General: No acute respiratory distress Lungs: Clear to auscultation bilaterally without wheezes or crackles Cardiovascular: Regular rate and rhythm without murmur gallop or rub normal S1 and S2 Abdomen: Nontender, nondistended, soft, bowel sounds positive, no rebound, no ascites, no appreciable mass Extremities: No significant cyanosis, clubbing, or edema bilateral lower extremities  CBC: Recent Labs  Lab 09/28/20 1420 09/29/20 1153  WBC 14.3* 9.2  NEUTROABS  --  7.0  HGB 15.2* 15.0  HCT 46.9* 46.7*  MCV 90.5 90.2  PLT 366 0000000   Basic Metabolic Panel: Recent Labs  Lab 09/28/20 1646 09/29/20 0651 09/29/20 1153 09/30/20 0622  NA  --  139 137 138  K  --  3.5 3.8 3.2*  CL  --  100 99 104  CO2  --  '27 28 27  '$ GLUCOSE  --  108* 150* 104*  BUN  --  '18 17 19  '$ CREATININE  --  0.89 0.83 0.69  CALCIUM  --  9.1 9.3 8.8*  MG  --  2.2 2.2 2.1  PHOS 3.6  --  4.1  --    GFR: Estimated Creatinine Clearance: 37.3 mL/min (by C-G formula based on SCr of 0.69 mg/dL).  Liver Function Tests: Recent Labs  Lab 09/28/20 1646 09/29/20 1153  AST 19 26  ALT 14 14  ALKPHOS 94 100  BILITOT 0.9 0.8  PROT 7.7 8.3*  ALBUMIN 3.3* 3.5    Coagulation Profile: Recent Labs  Lab 09/28/20 1420  INR 1.1    Cardiac Enzymes: Recent Labs  Lab 09/28/20 1646  CKTOTAL 22*    HbA1C: Hgb A1c MFr Bld  Date/Time Value Ref Range Status   07/28/2020 03:16 PM 5.2 4.8 - 5.6 % Final    Comment:    (NOTE)         Prediabetes: 5.7 - 6.4         Diabetes: >6.4         Glycemic control for adults with diabetes: <7.0     Recent Results (from the past 240 hour(s))  Resp Panel by RT-PCR (Flu A&B, Covid) Nasopharyngeal Swab     Status: None   Collection Time: 09/28/20  3:19 PM   Specimen: Nasopharyngeal Swab; Nasopharyngeal(NP) swabs in vial transport medium  Result Value Ref Range Status   SARS Coronavirus 2 by RT PCR NEGATIVE NEGATIVE Final    Comment: (NOTE) SARS-CoV-2 target nucleic acids are NOT DETECTED.  The SARS-CoV-2 RNA is generally detectable in upper respiratory specimens during the acute phase of infection. The lowest concentration of SARS-CoV-2 viral copies this assay can detect is 138 copies/mL. A negative result does not preclude SARS-Cov-2 infection and should not be used as the sole basis for treatment or other patient management decisions. A negative result may occur with  improper specimen collection/handling, submission of specimen other than nasopharyngeal swab, presence of viral mutation(s) within the areas targeted by this assay, and inadequate number of viral copies(<138 copies/mL). A negative result must be combined with clinical observations, patient history, and epidemiological information. The expected result is Negative.  Fact Sheet for Patients:  EntrepreneurPulse.com.au  Fact Sheet for Healthcare Providers:  IncredibleEmployment.be  This test is no t yet approved or cleared by the [place] FDA and  has been authorized for detection and/or diagnosis of SARS-CoV-2 by FDA under an Emergency Use Authorization (EUA). This EUA will remain  in effect (meaning this test can be used) for the duration of the COVID-19 declaration under Section 564(b)(1) of the Act, 21 U.S.C.section 360bbb-3(b)(1), unless the authorization is terminated  or revoked sooner.        Influenza A by PCR NEGATIVE NEGATIVE Final   Influenza B by PCR NEGATIVE NEGATIVE Final    Comment: (NOTE) The Xpert Xpress SARS-CoV-2/FLU/RSV plus assay is intended as an aid in the diagnosis of influenza from Nasopharyngeal swab specimens and should not be used as a sole basis for treatment. Nasal washings and aspirates are unacceptable for Xpert Xpress SARS-CoV-2/FLU/RSV testing.  Fact Sheet for Patients: EntrepreneurPulse.com.au  Fact Sheet for Healthcare Providers: IncredibleEmployment.be  This test is not yet approved or cleared by the [place] FDA and has been authorized for detection and/or diagnosis of SARS-CoV-2 by FDA under an Emergency Use Authorization (EUA). This EUA will remain in effect (meaning this test can be used) for the duration of the COVID-19 declaration under Section 564(b)(1) of the Act, 21 U.S.C. section 360bbb-3(b)(1), unless the authorization is terminated or revoked.  Performed at [facility],  [place], [place] [address]   Blood culture (routine single)     Status: None (Preliminary result)   Collection Time: 09/28/20  3:19 PM   Specimen: BLOOD  Result Value Ref Range Status   Specimen Description BLOOD LEFT ANTECUBITAL  Final   Special Requests   Final    BOTTLES DRAWN AEROBIC AND ANAEROBIC Blood Culture results may not be optimal due to an inadequate volume of blood received in culture bottles   Culture   Final    NO GROWTH 2 DAYS Performed at [facility], [address], [place], [place] [address]    Report Status PENDING  Incomplete  Urine Culture     Status: Abnormal (Preliminary result)   Collection Time: 09/28/20  3:21 PM   Specimen: Urine, Random  Result Value Ref Range Status   Specimen Description   Final    URINE, RANDOM Performed at [facility], [address], [place], [place] [address]    Special Requests   Final     NONE Performed at [facility], [address], [place], [place] [address]    Culture (A)  Final    >=100,000 COLONIES/mL PSEUDOMONAS AERUGINOSA SUSCEPTIBILITIES TO FOLLOW Performed at [facility], [place] [address], [place],  [address]    Report Status PENDING  Incomplete  MRSA Next Gen by PCR, Nasal     Status: None   Collection Time: 09/30/20  4:08 AM   Specimen: Nasal Mucosa; Nasal Swab  Result Value Ref Range Status   MRSA by PCR Next Gen NOT DETECTED NOT DETECTED Final    Comment: (NOTE) The GeneXpert MRSA Assay (FDA approved for NASAL specimens only), is one component of a comprehensive MRSA colonization surveillance program. It is not intended to diagnose MRSA infection nor to guide or monitor treatment for MRSA infections. Test performance is not FDA approved in patients less than 34 years old. Performed at [facility], [address] [place],  [address]      Scheduled Meds:  diltiazem  120 mg Oral Daily   furosemide  40 mg Intravenous Daily   lidocaine  1 patch Transdermal Q24H   milk and molasses  1 enema Rectal Once   pantoprazole  40 mg Oral BID AC   polyethylene glycol  17 g Oral Daily   potassium chloride  40 mEq Oral Once   pravastatin  20 mg Oral q1800   Rivaroxaban  15 mg Oral Q supper   sacubitril-valsartan  1 tablet Oral BID   sodium chloride flush  3 mL Intravenous Q12H   spironolactone  12.5 mg Oral Daily   Continuous Infusions:  sodium chloride     amiodarone 30 mg/hr (09/29/20 2314)   cefTRIAXone (ROCEPHIN)  IV 200 mL/hr at 09/29/20 2045   metronidazole 500 mg (09/30/20 0559)     LOS: 2 days   Cherene Altes, MD [facility] Office  [phone] Pager - Text Page per Shea Evans  If 7PM-7AM, please contact night-coverage per Amion 09/30/2020, 10:45 AM

## 2020-09-30 NOTE — Progress Notes (Signed)
PT Cancellation Note  Patient Details Name: [name] [name] MRN: [id number] DOB: [birth year]   Cancelled Treatment:    Reason Eval/Treat Not Completed: Other (comment).  PT consult received.  Chart reviewed.  Pt with order for TLSO brace (d/t acute L2 compression fx) but therapist did not see brace in room; nurse notified.  Will re-attempt PT evaluation once pt has TLSO brace.  Leitha Bleak, PT 09/30/20, 9:18 AM

## 2020-09-30 NOTE — Consult Note (Signed)
Pharmacy Antibiotic Note  [name] [name] is a 87 y.o. female admitted on 09/28/2020 with UTI.  Pharmacy has been consulted for cefepime dosing.  Recently diagnosed w/ Klebsiella UTI (8/15) treated with IV Rocephin and completed course with Keflex (8/15-8/18). Upon presentation, pt complains of no urinary symptoms.   UA w/ LE, no nitrites, no bacteria, WBC 21-50  Pt was placed on ceftriaxone and flagyl for empiric UTI/proctitis treatment. Ucx grew pseudomonas and sensitivities are pending, but after confirmation w/ MD, agreed to d/c ceftriaxone and flagyl and start cefepime.     Plan: Cefepime -Will start cefepime 2g IV q12h based on renal function   Ceftriaxone  -Will d/c order  Flagyl  -Will d/c order   Will continue to monitor renal function and make adjustments as clinically indicated   Height: '5\' 3"'$  (160 cm) Weight: 49.5 kg (109 lb 1.6 oz) IBW/kg (Calculated) : 52.4  Temp (24hrs), Avg:97.9 F (36.6 C), Min:97.7 F (36.5 C), Max:98.2 F (36.8 C)  Recent Labs  Lab 09/28/20 1420 09/28/20 1519 09/29/20 0651 09/29/20 1153 09/30/20 0622  WBC 14.3*  --   --  9.2  --   CREATININE 0.78  --  0.89 0.83 0.69  LATICACIDVEN  --  1.7  --   --   --     Estimated Creatinine Clearance: 37.3 mL/min (by C-G formula based on SCr of 0.69 mg/dL).    Allergies  Allergen Reactions   Metoprolol Swelling    Feet, ankles and legs    Bactrim [Sulfamethoxazole-Trimethoprim] Other (See Comments)    Hearing loss   Ciprofloxacin Other (See Comments)    Flu-like symptoms   Pneumococcal Vaccines Other (See Comments)    Flu-like symptoms    Antimicrobials this admission:  Ongoing Cefepime 09/02 >>  Completed  Ceftriaxone 08/31 >> 09/02 Metronidazole 08/31 >> 09/02  Microbiology results: 08/31 BCx: No growth x2 days 08/31 UCx: >100,000 CFU pseudomonas (sensitivities pending)   Thank you for allowing pharmacy to be a part of this patient's care.  Narda Rutherford,  PharmD Pharmacy Resident  09/30/2020 1:33 PM

## 2020-09-30 NOTE — Plan of Care (Signed)
  Problem: Education: Goal: Knowledge of General Education information will improve Description: Including pain rating scale, medication(s)/side effects and non-pharmacologic comfort measures 09/30/2020 1006 by Cristela Blue, RN Outcome: Progressing 09/30/2020 1006 by Cristela Blue, RN Outcome: Progressing   Problem: Health Behavior/Discharge Planning: Goal: Ability to manage health-related needs will improve 09/30/2020 1006 by Cristela Blue, RN Outcome: Progressing 09/30/2020 1006 by Cristela Blue, RN Outcome: Progressing   Problem: Clinical Measurements: Goal: Ability to maintain clinical measurements within normal limits will improve 09/30/2020 1006 by Cristela Blue, RN Outcome: Progressing 09/30/2020 1006 by Cristela Blue, RN Outcome: Progressing Goal: Will remain free from infection 09/30/2020 1006 by Cristela Blue, RN Outcome: Progressing 09/30/2020 1006 by Cristela Blue, RN Outcome: Progressing Goal: Diagnostic test results will improve 09/30/2020 1006 by Cristela Blue, RN Outcome: Progressing 09/30/2020 1006 by Cristela Blue, RN Outcome: Progressing Goal: Respiratory complications will improve 09/30/2020 1006 by Cristela Blue, RN Outcome: Progressing 09/30/2020 1006 by Cristela Blue, RN Outcome: Progressing Goal: Cardiovascular complication will be avoided 09/30/2020 1006 by Cristela Blue, RN Outcome: Progressing 09/30/2020 1006 by Cristela Blue, RN Outcome: Progressing   Problem: Activity: Goal: Risk for activity intolerance will decrease 09/30/2020 1006 by Cristela Blue, RN Outcome: Progressing 09/30/2020 1006 by Cristela Blue, RN Outcome: Progressing   Problem: Nutrition: Goal: Adequate nutrition will be maintained 09/30/2020 1006 by Cristela Blue, RN Outcome: Progressing 09/30/2020 1006 by Cristela Blue, RN Outcome: Progressing   Problem: Coping: Goal: Level of anxiety will decrease 09/30/2020 1006 by Cristela Blue, RN Outcome: Progressing 09/30/2020 1006 by  Cristela Blue, RN Outcome: Progressing   Problem: Elimination: Goal: Will not experience complications related to bowel motility 09/30/2020 1006 by Cristela Blue, RN Outcome: Progressing 09/30/2020 1006 by Cristela Blue, RN Outcome: Progressing Goal: Will not experience complications related to urinary retention 09/30/2020 1006 by Cristela Blue, RN Outcome: Progressing 09/30/2020 1006 by Cristela Blue, RN Outcome: Progressing   Problem: Pain Managment: Goal: General experience of comfort will improve 09/30/2020 1006 by Cristela Blue, RN Outcome: Progressing 09/30/2020 1006 by Cristela Blue, RN Outcome: Progressing   Problem: Safety: Goal: Ability to remain free from injury will improve 09/30/2020 1006 by Cristela Blue, RN Outcome: Progressing 09/30/2020 1006 by Cristela Blue, RN Outcome: Progressing   Problem: Skin Integrity: Goal: Risk for impaired skin integrity will decrease 09/30/2020 1006 by Cristela Blue, RN Outcome: Progressing 09/30/2020 1006 by Cristela Blue, RN Outcome: Progressing

## 2020-09-30 NOTE — Care Management Important Message (Signed)
Important Message  Patient Details  Name: [name] [name] MRN: [id number] Date of Birth: [birth year]   Medicare Important Message Given:  N/A - LOS <3 / Initial given by admissions  Initial Medicare IM reviewed by Wilnette Kales, Patient Access Associate on 09/29/2020 at 2:55am.   Dannette Barbara 09/30/2020, 10:04 AM

## 2020-09-30 NOTE — Progress Notes (Signed)
Orthopedic Tech Progress Note Patient Details:  [name] 1929/04/27 [id number] Called in order to [facility] Patient ID: [name] [name], female   DOB: 01/19/1930, 87 y.o.   MRN: [id number]  Chip Boer 09/30/2020, 9:26 AM

## 2020-09-30 NOTE — Consult Note (Signed)
   Heart Failure Nurse Navigator Note  [id number]%.  Right ventricular systolic function is mildly reduced.  Severe left atrial enlargement.  Mild right atrial enlargement.  Moderate mitral regurgitation.  Mild calcification of the aortic valve.  She presented from home with complaints of palpitations and back pain.  She was noted to be in atrial fibrillation with RVR.  She denied any worsening shortness of breath but chest x-ray revealed pulmonary edema and small bilateral pleural effusions with left being greater than right.   Comorbidities:  Anemia-iron deficiency Anxiety Colon cancer Hypertension Hyperlipidemia  Medications:  Amiodarone infusion Diltiazem 120 mg daily Furosemide 40 mg IV daily Potassium chloride 40 mEq 2 times daily Pravachol 20 mg daily Xarelto 15 mg daily with supper Entresto 24/26 mg 2 times a day Spironolactone 12 and half milligrams daily   Met with patient and son Percell Miller today.  Patient's hearing aid batteries are currently charging, so chose to speak directly with son.  He states that they eat most of their meals at restaurants.  They usually go to [employer] for an Egg McMuffin and then the other evening meal would be at various restaurants.  He states that his mother does not add salt to her food, when she is feeling good she has a good appetite.  Discussed fluid intake and he did not feel that she went over 64 ounces in a days time.  Talked about the importance of weighing daily and reporting 2 pound weight gain overnight or 5 pounds within the week.  Also discussed changes in symptoms such as increasing lower extremity edema, having sit up at night to breathe or increasing shortness of breath with activity and rest.  Felt that she was compliant with her medications.  This is her second admission in 2 weeks.  They were given the living with heart failure teaching booklet and zone magnet.  Pricilla Riffle RN CHFN

## 2020-09-30 NOTE — Evaluation (Signed)
Physical Therapy Evaluation Patient Details Name: [name] [name] MRN: [id number] DOB: 1929/03/31 Today's Date: 09/30/2020   History of Present Illness  Pt is an 87 y.o. female presenting to ED 8/31 with c/o palpitations, not feeling well, and back pain (pt may have fallen 5-6 days prior).  Imaging showing L2 compression fx.  Pt admitted with possible sepsis with UTI, a-fib with RVR, acute exacerbation of chronic systolic CHF, constipation, and new liver lesion.  PMH includes colon CA s/p colectomy, htn, HLD, chronic systolic CHF, GERD, anxiety, mild dementia per son, Colles fx 04/12/20, chronic ulcer L leg, HOH, h/o pelvic fx 2021.  Clinical Impression  Prior to hospital admission, pt was ambulatory with walker (RW within home and rollator outside of home) and lives with her son (pt reports having chair lift in home).  [employer] company representative present upon PT arrival and TLSO braced donned in bed.  Pt very HOH and reports her R ear hearing aid battery was not working (was not charged).  Currently pt is min to mod assist with bed mobility via logrolling; min assist with transfers; and CGA to min assist to ambulate 5 feet bed to recliner with RW.  Pt requiring extra time for activities (and ambulation distance limited) d/t pt's c/o increased low back pain with activity (pt reporting no pain at rest beginning/end of session though--only hurts with mobility).  Pt would benefit from skilled PT to address noted impairments and functional limitations (see below for any additional details).  Upon hospital discharge, pt would benefit from SNF.    Follow Up Recommendations SNF    Equipment Recommendations  Rolling walker with 5" wheels;3in1 (PT);Wheelchair (measurements PT);Wheelchair cushion (measurements PT)    Recommendations for Other Services OT consult     Precautions / Restrictions Precautions Precautions: Fall;Back Precaution Comments: L2 compression fx; TLSO brace when OOB (per nurse who  clarified with MD) Required Braces or Orthoses: Spinal Brace Spinal Brace: Thoracolumbosacral orthotic Restrictions Weight Bearing Restrictions: No      Mobility  Bed Mobility Overal bed mobility: Needs Assistance Bed Mobility: Rolling;Sidelying to Sit Rolling: Min assist Sidelying to sit: Min assist;Mod assist       General bed mobility comments: assist to logroll; assist for trunk R sidelying to sitting; cueing for technique    Transfers Overall transfer level: Needs assistance Equipment used: Rolling walker (2 wheeled) Transfers: Sit to/from Stand Sit to Stand: Min assist         General transfer comment: pt unable to stand 1st trial from bed d/t low back pain but able to stand 2nd trial from bed with min assist; cueing for UE placement  Ambulation/Gait Ambulation/Gait assistance: Min guard;Min assist Gait Distance (Feet): 5 Feet (bed to recliner) Assistive device: Rolling walker (2 wheeled)   Gait velocity: decreased   General Gait Details: decreased B LE step length/foot clearance; assist to steady  Science writer    Modified Rankin (Stroke Patients Only)       Balance Overall balance assessment: Needs assistance Sitting-balance support: No upper extremity supported;Feet supported Sitting balance-Leahy Scale: Good Sitting balance - Comments: steady sitting reaching within BOS   Standing balance support: Single extremity supported Standing balance-Leahy Scale: Fair Standing balance comment: pt requiring at least single UE support for static standing balance  Pertinent Vitals/Pain Pain Assessment: Faces Faces Pain Scale: No hurt (no pain at rest; 6-8/10 with activity) Pain Location: back Pain Descriptors / Indicators: Grimacing;Guarding Pain Intervention(s): Limited activity within patient's tolerance;Monitored during session;Repositioned Vitals (HR and O2 on room air) stable and  WFL throughout treatment session.    Home Living Family/patient expects to be discharged to:: Private residence Living Arrangements: Children (pt's son (who works)) Available Help at Discharge: Family Type of Home: House Home Access: Stairs to enter;Other (comment) (uses chair lift) Entrance Stairs-Rails: Can reach both Entrance Stairs-Number of Steps: 4 Home Layout: Multi-level;1/2 bath on main level;Bed/bath upstairs (has stair lift to reach 2nd floor bedroom) Home Equipment: Walker - 2 wheels;Walker - 4 wheels;Cane - single point;Shower seat;Grab bars - tub/shower Additional Comments: Pt reports having chair lift installed and does not need to navigate steps into or in home.    Prior Function Level of Independence: Independent with assistive device(s)         Comments: Ambulatory with RW household distances and rollator for community distances.     Hand Dominance   Dominant Hand: Right    Extremity/Trunk Assessment   Upper Extremity Assessment Upper Extremity Assessment: Overall WFL for tasks assessed    Lower Extremity Assessment Lower Extremity Assessment: Generalized weakness    Cervical / Trunk Assessment Cervical / Trunk Assessment: Normal  Communication   Communication: HOH (HOH R ear; deaf L ear)  Cognition Arousal/Alertness: Awake/alert Behavior During Therapy: WFL for tasks assessed/performed Overall Cognitive Status: Within Functional Limits for tasks assessed                                 General Comments: Pleasant      General Comments General comments (skin integrity, edema, etc.): small skin tear noted distal R forearm--nurse notified.  Nursing cleared pt for participation in physical therapy.  Pt agreeable to PT session.    Exercises  Spinal precaution training; brace use education; transfers   Assessment/Plan    PT Assessment Patient needs continued PT services  PT Problem List Decreased strength;Decreased activity  tolerance;Decreased balance;Decreased mobility;Decreased knowledge of use of DME;Decreased knowledge of precautions;Pain;Decreased skin integrity       PT Treatment Interventions DME instruction;Gait training;Functional mobility training;Therapeutic activities;Therapeutic exercise;Balance training;Patient/family education    PT Goals (Current goals can be found in the Care Plan section)  Acute Rehab PT Goals Patient Stated Goal: to improve pain and mobility PT Goal Formulation: With patient Time For Goal Achievement: 10/14/20 Potential to Achieve Goals: Fair    Frequency 7X/week   Barriers to discharge Decreased caregiver support      Co-evaluation               AM-PAC PT "6 Clicks" Mobility  Outcome Measure Help needed turning from your back to your side while in a flat bed without using bedrails?: None Help needed moving from lying on your back to sitting on the side of a flat bed without using bedrails?: A Lot Help needed moving to and from a bed to a chair (including a wheelchair)?: A Little Help needed standing up from a chair using your arms (e.g., wheelchair or bedside chair)?: A Little Help needed to walk in hospital room?: A Lot Help needed climbing 3-5 steps with a railing? : Total 6 Click Score: 15    End of Session Equipment Utilized During Treatment: Gait belt Activity Tolerance: Patient limited by pain Patient left: in chair;with call  bell/phone within reach;with chair alarm set Nurse Communication: Mobility status;Precautions;Other (comment) (pt's pain status during session; brace wear instructions) PT Visit Diagnosis: Muscle weakness (generalized) (M62.81);Other abnormalities of gait and mobility (R26.89)    Time: [id number] PT Time Calculation (min) (ACUTE ONLY): 44 min   Charges:   PT Evaluation $PT Eval Low Complexity: 1 Low PT Treatments $Therapeutic Activity: 23-37 mins       Leitha Bleak, PT 09/30/20, 12:45 PM

## 2020-10-01 LAB — URINE CULTURE: Culture: >=100000 — AB

## 2020-10-01 LAB — BASIC METABOLIC PANEL
Anion gap: 6 (ref 5–15)
BUN: 18 mg/dL (ref 8–23)
CO2: 25 mmol/L (ref 22–32)
Calcium: 8.6 mg/dL — ABNORMAL LOW (ref 8.9–10.3)
Chloride: 105 mmol/L (ref 98–111)
Creatinine, Ser: 0.72 mg/dL (ref 0.44–1.00)
GFR, Estimated: >60 mL/min (ref >60)
Glucose, Bld: 159 mg/dL — ABNORMAL HIGH (ref 70–99)
Potassium: 4.6 mmol/L (ref 3.5–5.1)
Sodium: 136 mmol/L (ref 135–145)

## 2020-10-01 MED ORDER — POTASSIUM CHLORIDE CRYS ER 20 MEQ PO TBCR
40.0000 meq | EXTENDED_RELEASE_TABLET | Freq: Every day | ORAL | Status: DC
  Administered 2020-10-02 – 2020-10-03 (×2): 40 meq via ORAL
  Filled 2020-10-01 (×2): qty 4

## 2020-10-01 MED ORDER — FUROSEMIDE 40 MG PO TABS
40.0000 mg | ORAL_TABLET | Freq: Every day | ORAL | Status: DC
  Administered 2020-10-02 – 2020-10-03 (×2): 40 mg via ORAL
  Filled 2020-10-01 (×2): qty 1

## 2020-10-01 NOTE — Progress Notes (Addendum)
Physical Therapy Treatment Patient Details Name: [name] [name] MRN: [id number] DOB: February 03, 1929 Today's Date: 10/01/2020    History of Present Illness Pt is an 87 y.o. female presenting to ED 8/31 with c/o palpitations, not feeling well, and back pain (pt may have fallen 5-6 days prior).  Imaging showing L2 compression fx.  Pt admitted with possible sepsis with UTI, a-fib with RVR, acute exacerbation of chronic systolic CHF, constipation, and new liver lesion.  PMH includes colon CA s/p colectomy, htn, HLD, chronic systolic CHF, GERD, anxiety, mild dementia per son, Colles fx 04/12/20, chronic ulcer L leg, HOH, h/o pelvic fx 2021.    PT Comments    Pt was long sitting in bed upon arriving. She was asleep but easily awakes for several minutes however remained lethargic throughout. " I don't feel well today." Does endorse back pain even at rest. Pt requested not to get OOB. She tolerated very minimal there ex in bed prior to falling asleep mid exercise.See exercises listed below. Pt will greatly benefit from continued therapy to progress back to baseline abilities. Highly recommend DC to SNF to address deficits while maximizing independence with ADLs.   Follow Up Recommendations  SNF     Equipment Recommendations  Other (comment) (defer to next level of care)       Precautions / Restrictions Precautions Precautions: Fall;Back Precaution Comments: L2 compression fx; TLSO brace when OOB (per nurse who clarified with MD) Required Braces or Orthoses: Spinal Brace Spinal Brace: Thoracolumbosacral orthotic Restrictions Weight Bearing Restrictions: No    Mobility  Bed Mobility    General bed mobility comments: pt very lethargic and unwilling to get OOB. this is 2nd attempt to treat this date    Transfers    General transfer comment: pt too lethargic and unwilling         Cognition Arousal/Alertness: Lethargic Behavior During Therapy: WFL for tasks assessed/performed Overall  Cognitive Status: Within Functional Limits for tasks assessed        General Comments: Pt is lethargic and unwilling to get OOB. She request only performing ther ex in bed.      Exercises General Exercises - Lower Extremity Ankle Circles/Pumps: AROM;10 reps Quad Sets: AROM;10 reps Heel Slides: AROM;10 reps Hip ABduction/ADduction: AAROM;10 reps        Pertinent Vitals/Pain Pain Assessment: Faces Faces Pain Scale: Hurts little more Pain Location: back Pain Descriptors / Indicators: Grimacing;Guarding Pain Intervention(s): Limited activity within patient's tolerance;Monitored during session;Repositioned     PT Goals (current goals can now be found in the care plan section) Acute Rehab PT Goals Patient Stated Goal: none stated Progress towards PT goals: Not progressing toward goals - comment (lethargy and fatigue limiting)    Frequency    7X/week      PT Plan Current plan remains appropriate       AM-PAC PT "6 Clicks" Mobility   Outcome Measure  Help needed turning from your back to your side while in a flat bed without using bedrails?: None Help needed moving from lying on your back to sitting on the side of a flat bed without using bedrails?: A Lot Help needed moving to and from a bed to a chair (including a wheelchair)?: A Lot Help needed standing up from a chair using your arms (e.g., wheelchair or bedside chair)?: A Lot Help needed to walk in hospital room?: A Lot Help needed climbing 3-5 steps with a railing? : Total 6 Click Score: 13    End of Session  Activity Tolerance: Patient limited by lethargy;Patient limited by pain;Patient limited by fatigue Patient left: in bed;with call bell/phone within reach;with bed alarm set Nurse Communication: Mobility status PT Visit Diagnosis: Muscle weakness (generalized) (M62.81);Other abnormalities of gait and mobility (R26.89)     Time: [id number] PT Time Calculation (min) (ACUTE ONLY): 12 min  Charges:   $Therapeutic Exercise: 8-22 mins                     Julaine Fusi PTA 10/01/20, 2:18 PM

## 2020-10-01 NOTE — Progress Notes (Signed)
[name] [name]  [id number] DOB: March 06, 1929 DOA: 09/28/2020 PCP: [name], MD    Brief Narrative:  87 year old with a history of colon cancer status post colectomy, HTN, HLD, chronic systolic CHF (EF 99991111), GERD, anxiety, and dementia who was brought to the ER with palpitations malaise generalized weakness and back pain.  Significant Events:  In the ER she was found to be in A. fib with RVR.  She was also diagnosed with an acute L2 compression fracture, constipation, UTI, and possible proctitis as noted on a CT abdomen pelvis.  Consultants:  Oncology Cardiology  Code Status: NO CODE BLUE  Antimicrobials:  Rocephin 8/31 > 9/1 Flagyl 8/31 > 9/2 Cefepime 9/2 >  DVT prophylaxis: Xarelto  Subjective: Afebrile.  Vital signs stable.  No acute events reported overnight.  PT has recommended SNF for rehabilitation.  Appears much more comfortable today.  In good spirits.  Denies any complaints at the time of my visit.  Assessment & Plan:  Atrial fibrillation with acute RVR Has required amiodarone drip as well as Cardizem infusion - CHA2DS2-VASc is 5 - continue Xarelto - has a history of beta-blocker intolerance -rate presently well controlled  Acute exacerbation of chronic systolic CHF EF [id number] via TTE July 2022 - continue Lasix, Aldactone, Entresto - appears compensated at present time - net +600 cc since admission  Filed Weights   09/28/20 1419 09/29/20 2035  Weight: 47 kg 49.5 kg    Acute L2 compression fracture Continue pain management - PT/OT - TLSO brace -will require SNF placement for rehabilitation  Stage III colon cancer status post colectomy New 1.9 cm right hepatic lobe lesion noted this admission - Oncology has evaluated  Pseudomonas UTI - possible proctitis Antibiotic adjusted -no concerning findings on sensitivity testing -clinically stabilizing  Hypokalemia Due to poor intake and diuresis-corrected with  supplementation  Constipation Continue bowel regimen  Dementia Complicates effective treatment and communication  Family Communication: No family present at time of exam Status is: Inpatient  Remains inpatient appropriate because:Inpatient level of care appropriate due to severity of illness  Dispo: The patient is from: Home w/ son               Anticipated d/c is to: SNF              Patient currently is not medically stable to d/c.   Difficult to place patient No   Objective: Blood pressure 140/62, pulse 66, temperature 98.4 F (36.9 C), temperature source Oral, resp. rate 17, height '5\' 3"'$  (1.6 m), weight 49.5 kg, SpO2 92 %.  Intake/Output Summary (Last 24 hours) at 10/01/2020 0954 Last data filed at 10/01/2020 0600 Gross per 24 hour  Intake 752.79 ml  Output 1150 ml  Net -397.21 ml    Filed Weights   09/28/20 1419 09/29/20 2035  Weight: 47 kg 49.5 kg    Examination: General: No acute respiratory distress Lungs: Clear to auscultation bilaterally  Cardiovascular: Regular rate w/o M or rub  Abdomen: NT/ND, soft, bs+, no mass  Extremities: No significant edema bilateral lower extremities  CBC: Recent Labs  Lab 09/28/20 1420 09/29/20 1153  WBC 14.3* 9.2  NEUTROABS  --  7.0  HGB 15.2* 15.0  HCT 46.9* 46.7*  MCV 90.5 90.2  PLT 366 0000000    Basic Metabolic Panel: Recent Labs  Lab 09/28/20 1646 09/29/20 0651 09/29/20 1153 09/30/20 0622 10/01/20 0401  NA  --  139 137 138 136  K  --  3.5 3.8 3.2* 4.6  CL  --  100 99 104 105  CO2  --  '27 28 27 25  '$ GLUCOSE  --  108* 150* 104* 159*  BUN  --  '18 17 19 18  '$ CREATININE  --  0.89 0.83 0.69 0.72  CALCIUM  --  9.1 9.3 8.8* 8.6*  MG  --  2.2 2.2 2.1  --   PHOS 3.6  --  4.1  --   --     GFR: Estimated Creatinine Clearance: 37.3 mL/min (by C-G formula based on SCr of 0.72 mg/dL).  Liver Function Tests: Recent Labs  Lab 09/28/20 1646 09/29/20 1153  AST 19 26  ALT 14 14  ALKPHOS 94 100  BILITOT 0.9 0.8  PROT  7.7 8.3*  ALBUMIN 3.3* 3.5     Coagulation Profile: Recent Labs  Lab 09/28/20 1420  INR 1.1     Cardiac Enzymes: Recent Labs  Lab 09/28/20 1646  CKTOTAL 22*     HbA1C: Hgb A1c MFr Bld  Date/Time Value Ref Range Status  07/28/2020 03:16 PM 5.2 4.8 - 5.6 % Final    Comment:    (NOTE)         Prediabetes: 5.7 - 6.4         Diabetes: >6.4         Glycemic control for adults with diabetes: <7.0     Recent Results (from the past 240 hour(s))  Resp Panel by RT-PCR (Flu A&B, Covid) Nasopharyngeal Swab     Status: None   Collection Time: 09/28/20  3:19 PM   Specimen: Nasopharyngeal Swab; Nasopharyngeal(NP) swabs in vial transport medium  Result Value Ref Range Status   SARS Coronavirus 2 by RT PCR NEGATIVE NEGATIVE Final    Comment: (NOTE) SARS-CoV-2 target nucleic acids are NOT DETECTED.  The SARS-CoV-2 RNA is generally detectable in upper respiratory specimens during the acute phase of infection. The lowest concentration of SARS-CoV-2 viral copies this assay can detect is 138 copies/mL. A negative result does not preclude SARS-Cov-2 infection and should not be used as the sole basis for treatment or other patient management decisions. A negative result may occur with  improper specimen collection/handling, submission of specimen other than nasopharyngeal swab, presence of viral mutation(s) within the areas targeted by this assay, and inadequate number of viral copies(<138 copies/mL). A negative result must be combined with clinical observations, patient history, and epidemiological information. The expected result is Negative.  Fact Sheet for Patients:  EntrepreneurPulse.com.au  Fact Sheet for Healthcare Providers:  IncredibleEmployment.be  This test is no t yet approved or cleared by the [place] FDA and  has been authorized for detection and/or diagnosis of SARS-CoV-2 by FDA under an Emergency Use Authorization (EUA).  This EUA will remain  in effect (meaning this test can be used) for the duration of the COVID-19 declaration under Section 564(b)(1) of the Act, 21 U.S.C.section 360bbb-3(b)(1), unless the authorization is terminated  or revoked sooner.       Influenza A by PCR NEGATIVE NEGATIVE Final   Influenza B by PCR NEGATIVE NEGATIVE Final    Comment: (NOTE) The Xpert Xpress SARS-CoV-2/FLU/RSV plus assay is intended as an aid in the diagnosis of influenza from Nasopharyngeal swab specimens and should not be used as a sole basis for treatment. Nasal washings and aspirates are unacceptable for Xpert Xpress SARS-CoV-2/FLU/RSV testing.  Fact Sheet for Patients: EntrepreneurPulse.com.au  Fact Sheet for Healthcare Providers: IncredibleEmployment.be  This test is not yet approved or cleared by the [place] FDA  and has been authorized for detection and/or diagnosis of SARS-CoV-2 by FDA under an Emergency Use Authorization (EUA). This EUA will remain in effect (meaning this test can be used) for the duration of the COVID-19 declaration under Section 564(b)(1) of the Act, 21 U.S.C. section 360bbb-3(b)(1), unless the authorization is terminated or revoked.  Performed at [facility], [address] [place], [place] [address]   Blood culture (routine single)     Status: None (Preliminary result)   Collection Time: 09/28/20  3:19 PM   Specimen: BLOOD  Result Value Ref Range Status   Specimen Description BLOOD LEFT ANTECUBITAL  Final   Special Requests   Final    BOTTLES DRAWN AEROBIC AND ANAEROBIC Blood Culture results may not be optimal due to an inadequate volume of blood received in culture bottles   Culture   Final    NO GROWTH 3 DAYS Performed at [facility], [address], [place], [place] [address]    Report Status PENDING  Incomplete  Urine Culture     Status: Abnormal   Collection Time: 09/28/20  3:21 PM   Specimen:  Urine, Random  Result Value Ref Range Status   Specimen Description   Final    URINE, RANDOM Performed at [facility], [address] [place], [place] [address]    Special Requests   Final    NONE Performed at [facility], [address] [place], [place] [address]    Culture >=100,000 COLONIES/mL PSEUDOMONAS AERUGINOSA (A)  Final   Report Status 10/01/2020 FINAL  Final   Organism ID, Bacteria PSEUDOMONAS AERUGINOSA (A)  Final      Susceptibility   Pseudomonas aeruginosa - MIC*    CEFTAZIDIME 4 SENSITIVE Sensitive     CIPROFLOXACIN <=0.25 SENSITIVE Sensitive     GENTAMICIN <=1 SENSITIVE Sensitive     IMIPENEM 1 SENSITIVE Sensitive     PIP/TAZO 8 SENSITIVE Sensitive     CEFEPIME 2 SENSITIVE Sensitive     * >=100,000 COLONIES/mL PSEUDOMONAS AERUGINOSA  MRSA Next Gen by PCR, Nasal     Status: None   Collection Time: 09/30/20  4:08 AM   Specimen: Nasal Mucosa; Nasal Swab  Result Value Ref Range Status   MRSA by PCR Next Gen NOT DETECTED NOT DETECTED Final    Comment: (NOTE) The GeneXpert MRSA Assay (FDA approved for NASAL specimens only), is one component of a comprehensive MRSA colonization surveillance program. It is not intended to diagnose MRSA infection nor to guide or monitor treatment for MRSA infections. Test performance is not FDA approved in patients less than 74 years old. Performed at [facility], [address] [place], [place] [address]      Scheduled Meds:  acetaminophen  500 mg Oral Q8H   vitamin C  250 mg Oral BID   diltiazem  120 mg Oral Daily   feeding supplement  1 Container Oral TID BM   furosemide  40 mg Intravenous Daily   lidocaine  1 patch Transdermal Q24H   multivitamin with minerals  1 tablet Oral Daily   pantoprazole  40 mg Oral BID AC   polyethylene glycol  17 g Oral Daily   potassium chloride  40 mEq Oral BID   pravastatin  20 mg Oral q1800   Rivaroxaban  15 mg Oral Q supper   sacubitril-valsartan  1  tablet Oral BID   senna-docusate  1 tablet Oral BID   sodium chloride flush  3 mL Intravenous Q12H  spironolactone  12.5 mg Oral Daily   Continuous Infusions:  sodium chloride     amiodarone 30 mg/hr (10/01/20 0046)   ceFEPime (MAXIPIME) IV 2 g (10/01/20 0936)     LOS: 3 days   Cherene Altes, MD [facility] Office  [phone] Pager - Text Page per Shea Evans  If 7PM-7AM, please contact night-coverage per Amion 10/01/2020, 9:54 AM

## 2020-10-02 MED ORDER — AMIODARONE HCL 200 MG PO TABS
200.0000 mg | ORAL_TABLET | Freq: Two times a day (BID) | ORAL | Status: DC
  Administered 2020-10-02 – 2020-10-04 (×5): 200 mg via ORAL
  Filled 2020-10-02 (×5): qty 1

## 2020-10-02 NOTE — Progress Notes (Signed)
PT Cancellation Note  Patient Details Name: [name] [name] MRN: [id number] DOB: 03-23-1929   Cancelled Treatment:    Reason Eval/Treat Not Completed: Fatigue/lethargy limiting ability to participate;Patient declined, no reason specified. Pt supine in bed with lunch tray. Declining OOB mobility due to feeling weak. PT explained that feeling weak is more of an indicator to work with therapy, for improved independence, strength, and return to baseline function. Pt continues to decline. Intermittent falling asleep with increased RR at 20-24 breaths/min at rest. Declining supine therex despite education regarding benefits. Agreeable for PT to come back tomorrow. Will follow up with therapeutic intervention tomorrow as appropriate.   Herminio Commons, PT, DPT 1:15 PM,10/02/20

## 2020-10-02 NOTE — Progress Notes (Signed)
[name] [name]  [id number] DOB: 04-09-1929 DOA: 09/28/2020 PCP: [name], MD    Brief Narrative:  87 year old with a history of colon cancer status post colectomy, HTN, HLD, chronic systolic CHF (EF [id number]), GERD, anxiety, and dementia who was brought to the ER with palpitations malaise generalized weakness and back pain.  Significant Events:  In the ER she was found to be in A. fib with RVR.  She was also diagnosed with an acute L2 compression fracture, constipation, UTI, and possible proctitis as noted on a CT abdomen pelvis.  Consultants:  Oncology Cardiology  Code Status: NO CODE BLUE  Antimicrobials:  Rocephin 8/31 > 9/1 Flagyl 8/31 > 9/2 Cefepime 9/2 >  DVT prophylaxis: Xarelto  Subjective: No acute events reported overnight.  Afebrile.  Vital signs stable.  Patient is now medically cleared for discharge to a rehab facility.  Resting comfortably in bed at the time of my visit.  Assessment & Plan:  Newly diagnosed acute atrial fibrillation with acute RVR Has required amiodarone drip as well as Cardizem infusion - CHA2DS2-VASc is 5 - continue Xarelto - has a history of beta-blocker intolerance -rate presently well controlled -transition to oral amiodarone today  Acute exacerbation of chronic systolic CHF EF [id number] via TTE July 2022 - continue Lasix, Aldactone, Entresto - appears compensated at present time - net -1800 cc since admission  Filed Weights   09/28/20 1419 09/29/20 2035 10/02/20 0500  Weight: 47 kg 49.5 kg 50.3 kg    Acute L2 compression fracture Continue pain management - PT/OT - TLSO brace -will require SNF placement for rehabilitation  Stage III colon cancer status post colectomy New 1.9 cm right hepatic lobe lesion noted this admission - Oncology has evaluated  Pseudomonas UTI - possible proctitis Antibiotic adjusted -no concerning findings on sensitivity testing -clinically stabilizing - transition to oral abx to complete a 7 day  course   Hypokalemia Due to poor intake and diuresis-corrected with supplementation  Constipation Continue bowel regimen  Dementia Complicates effective treatment and communication  Family Communication: No family present at time of exam Status is: Inpatient  Remains inpatient appropriate because:Inpatient level of care appropriate due to severity of illness  Dispo: The patient is from: Home w/ son               Anticipated d/c is to: SNF              Patient currently is not medically stable to d/c.   Difficult to place patient No   Objective: Blood pressure 140/72, pulse 69, temperature 98.2 F (36.8 C), temperature source Oral, resp. rate 16, height '5\' 3"'$  (1.6 m), weight 50.3 kg, SpO2 98 %.  Intake/Output Summary (Last 24 hours) at 10/02/2020 1000 Last data filed at 10/02/2020 0556 Gross per 24 hour  Intake --  Output 2400 ml  Net -2400 ml    Filed Weights   09/28/20 1419 09/29/20 2035 10/02/20 0500  Weight: 47 kg 49.5 kg 50.3 kg    Examination: General: No acute respiratory distress Lungs: Clear to auscultation B Cardiovascular: Regular rate w/o M or rub  Abdomen: NT/ND, soft, bs+, no mass  Extremities: No significant edema B LE   CBC: Recent Labs  Lab 09/28/20 1420 09/29/20 1153  WBC 14.3* 9.2  NEUTROABS  --  7.0  HGB 15.2* 15.0  HCT 46.9* 46.7*  MCV 90.5 90.2  PLT 366 0000000    Basic Metabolic Panel: Recent Labs  Lab 09/28/20 1646 09/29/20 0651 09/29/20 1153  09/30/20 0622 10/01/20 0401  NA  --  139 137 138 136  K  --  3.5 3.8 3.2* 4.6  CL  --  100 99 104 105  CO2  --  '27 28 27 25  '$ GLUCOSE  --  108* 150* 104* 159*  BUN  --  '18 17 19 18  '$ CREATININE  --  0.89 0.83 0.69 0.72  CALCIUM  --  9.1 9.3 8.8* 8.6*  MG  --  2.2 2.2 2.1  --   PHOS 3.6  --  4.1  --   --     GFR: Estimated Creatinine Clearance: 37.9 mL/min (by C-G formula based on SCr of 0.72 mg/dL).  Liver Function Tests: Recent Labs  Lab 09/28/20 1646 09/29/20 1153  AST 19 26   ALT 14 14  ALKPHOS 94 100  BILITOT 0.9 0.8  PROT 7.7 8.3*  ALBUMIN 3.3* 3.5     Coagulation Profile: Recent Labs  Lab 09/28/20 1420  INR 1.1     Cardiac Enzymes: Recent Labs  Lab 09/28/20 1646  CKTOTAL 22*     HbA1C: Hgb A1c MFr Bld  Date/Time Value Ref Range Status  07/28/2020 03:16 PM 5.2 4.8 - 5.6 % Final    Comment:    (NOTE)         Prediabetes: 5.7 - 6.4         Diabetes: >6.4         Glycemic control for adults with diabetes: <7.0     Recent Results (from the past 240 hour(s))  Resp Panel by RT-PCR (Flu A&B, Covid) Nasopharyngeal Swab     Status: None   Collection Time: 09/28/20  3:19 PM   Specimen: Nasopharyngeal Swab; Nasopharyngeal(NP) swabs in vial transport medium  Result Value Ref Range Status   SARS Coronavirus 2 by RT PCR NEGATIVE NEGATIVE Final    Comment: (NOTE) SARS-CoV-2 target nucleic acids are NOT DETECTED.  The SARS-CoV-2 RNA is generally detectable in upper respiratory specimens during the acute phase of infection. The lowest concentration of SARS-CoV-2 viral copies this assay can detect is 138 copies/mL. A negative result does not preclude SARS-Cov-2 infection and should not be used as the sole basis for treatment or other patient management decisions. A negative result may occur with  improper specimen collection/handling, submission of specimen other than nasopharyngeal swab, presence of viral mutation(s) within the areas targeted by this assay, and inadequate number of viral copies(<138 copies/mL). A negative result must be combined with clinical observations, patient history, and epidemiological information. The expected result is Negative.  Fact Sheet for Patients:  EntrepreneurPulse.com.au  Fact Sheet for Healthcare Providers:  IncredibleEmployment.be  This test is no t yet approved or cleared by the [place] FDA and  has been authorized for detection and/or diagnosis of SARS-CoV-2  by FDA under an Emergency Use Authorization (EUA). This EUA will remain  in effect (meaning this test can be used) for the duration of the COVID-19 declaration under Section 564(b)(1) of the Act, 21 U.S.C.section 360bbb-3(b)(1), unless the authorization is terminated  or revoked sooner.       Influenza A by PCR NEGATIVE NEGATIVE Final   Influenza B by PCR NEGATIVE NEGATIVE Final    Comment: (NOTE) The Xpert Xpress SARS-CoV-2/FLU/RSV plus assay is intended as an aid in the diagnosis of influenza from Nasopharyngeal swab specimens and should not be used as a sole basis for treatment. Nasal washings and aspirates are unacceptable for Xpert Xpress SARS-CoV-2/FLU/RSV testing.  Fact Sheet for  Patients: EntrepreneurPulse.com.au  Fact Sheet for Healthcare Providers: IncredibleEmployment.be  This test is not yet approved or cleared by the [place] FDA and has been authorized for detection and/or diagnosis of SARS-CoV-2 by FDA under an Emergency Use Authorization (EUA). This EUA will remain in effect (meaning this test can be used) for the duration of the COVID-19 declaration under Section 564(b)(1) of the Act, 21 U.S.C. section 360bbb-3(b)(1), unless the authorization is terminated or revoked.  Performed at [facility], [address] [place], [place] [address]   Blood culture (routine single)     Status: None (Preliminary result)   Collection Time: 09/28/20  3:19 PM   Specimen: BLOOD  Result Value Ref Range Status   Specimen Description BLOOD LEFT ANTECUBITAL  Final   Special Requests   Final    BOTTLES DRAWN AEROBIC AND ANAEROBIC Blood Culture results may not be optimal due to an inadequate volume of blood received in culture bottles   Culture   Final    NO GROWTH 3 DAYS Performed at [facility], [address], [place], [place] [address]    Report Status PENDING  Incomplete  Urine Culture     Status: Abnormal    Collection Time: 09/28/20  3:21 PM   Specimen: Urine, Random  Result Value Ref Range Status   Specimen Description   Final    URINE, RANDOM Performed at [facility], [address] [place], [place] [address]    Special Requests   Final    NONE Performed at [facility], [address] [place], [place] [address]    Culture >=100,000 COLONIES/mL PSEUDOMONAS AERUGINOSA (A)  Final   Report Status 10/01/2020 FINAL  Final   Organism ID, Bacteria PSEUDOMONAS AERUGINOSA (A)  Final      Susceptibility   Pseudomonas aeruginosa - MIC*    CEFTAZIDIME 4 SENSITIVE Sensitive     CIPROFLOXACIN <=0.25 SENSITIVE Sensitive     GENTAMICIN <=1 SENSITIVE Sensitive     IMIPENEM 1 SENSITIVE Sensitive     PIP/TAZO 8 SENSITIVE Sensitive     CEFEPIME 2 SENSITIVE Sensitive     * >=100,000 COLONIES/mL PSEUDOMONAS AERUGINOSA  MRSA Next Gen by PCR, Nasal     Status: None   Collection Time: 09/30/20  4:08 AM   Specimen: Nasal Mucosa; Nasal Swab  Result Value Ref Range Status   MRSA by PCR Next Gen NOT DETECTED NOT DETECTED Final    Comment: (NOTE) The GeneXpert MRSA Assay (FDA approved for NASAL specimens only), is one component of a comprehensive MRSA colonization surveillance program. It is not intended to diagnose MRSA infection nor to guide or monitor treatment for MRSA infections. Test performance is not FDA approved in patients less than 61 years old. Performed at [facility], [address] [place], [place] [address]      Scheduled Meds:  acetaminophen  500 mg Oral Q8H   vitamin C  250 mg Oral BID   diltiazem  120 mg Oral Daily   feeding supplement  1 Container Oral TID BM   furosemide  40 mg Oral Daily   lidocaine  1 patch Transdermal Q24H   multivitamin with minerals  1 tablet Oral Daily   pantoprazole  40 mg Oral BID AC   polyethylene glycol  17 g Oral Daily   potassium chloride  40 mEq Oral Daily   pravastatin  20 mg Oral q1800   Rivaroxaban  15 mg  Oral Q supper   sacubitril-valsartan  1 tablet Oral BID   senna-docusate  1 tablet Oral BID   sodium chloride flush  3 mL Intravenous Q12H   spironolactone  12.5 mg Oral Daily   Continuous Infusions:  sodium chloride     amiodarone 30 mg/hr (10/02/20 0118)   ceFEPime (MAXIPIME) IV 2 g (10/02/20 0851)     LOS: 4 days   Cherene Altes, MD [facility] Office  [phone] Pager - Text Page per Shea Evans  If 7PM-7AM, please contact night-coverage per Amion 10/02/2020, 10:00 AM

## 2020-10-03 LAB — BASIC METABOLIC PANEL
Anion gap: 9 (ref 5–15)
BUN: 26 mg/dL — ABNORMAL HIGH (ref 8–23)
CO2: 23 mmol/L (ref 22–32)
Calcium: 9.3 mg/dL (ref 8.9–10.3)
Chloride: 102 mmol/L (ref 98–111)
Creatinine, Ser: 0.81 mg/dL (ref 0.44–1.00)
GFR, Estimated: >60 mL/min (ref >60)
Glucose, Bld: 109 mg/dL — ABNORMAL HIGH (ref 70–99)
Potassium: 4.7 mmol/L (ref 3.5–5.1)
Sodium: 134 mmol/L — ABNORMAL LOW (ref 135–145)

## 2020-10-03 LAB — CULTURE, BLOOD (SINGLE): Culture: NO GROWTH

## 2020-10-03 LAB — CBC
HCT: 45.4 % (ref 36.0–46.0)
Hemoglobin: 14.5 g/dL (ref 12.0–15.0)
MCH: 29.1 pg (ref 26.0–34.0)
MCHC: 31.9 g/dL (ref 30.0–36.0)
MCV: 91 fL (ref 80.0–100.0)
Platelets: 347 10*3/uL (ref 150–400)
RBC: 4.99 MIL/uL (ref 3.87–5.11)
RDW: 18.5 % — ABNORMAL HIGH (ref 11.5–15.5)
WBC: 8.5 10*3/uL (ref 4.0–10.5)
nRBC: 0 % (ref 0.0–0.2)

## 2020-10-03 MED ORDER — ACETAMINOPHEN 500 MG PO TABS: 500.0000 mg | ORAL_TABLET | Freq: Four times a day (QID) | ORAL | 0 refills | Status: AC | PRN

## 2020-10-03 MED ORDER — SENNOSIDES-DOCUSATE SODIUM 8.6-50 MG PO TABS: 1.0000 | ORAL_TABLET | Freq: Two times a day (BID) | ORAL | Status: AC

## 2020-10-03 MED ORDER — FUROSEMIDE 20 MG PO TABS
20.0000 mg | ORAL_TABLET | Freq: Every day | ORAL | Status: DC
  Administered 2020-10-04: 20 mg via ORAL
  Filled 2020-10-03: qty 1

## 2020-10-03 MED ORDER — FUROSEMIDE 20 MG PO TABS: 20.0000 mg | ORAL_TABLET | Freq: Every day | ORAL | Status: AC

## 2020-10-03 MED ORDER — BISACODYL 10 MG RE SUPP: 10.0000 mg | Freq: Every day | RECTAL | 0 refills | Status: AC | PRN

## 2020-10-03 MED ORDER — DILTIAZEM HCL ER COATED BEADS 120 MG PO CP24: 120.0000 mg | ORAL_CAPSULE | Freq: Every day | ORAL | Status: AC

## 2020-10-03 MED ORDER — SPIRONOLACTONE 25 MG PO TABS: 12.5000 mg | ORAL_TABLET | Freq: Every day | ORAL | Status: AC

## 2020-10-03 MED ORDER — SACUBITRIL-VALSARTAN 24-26 MG PO TABS: 1.0000 | ORAL_TABLET | Freq: Two times a day (BID) | ORAL | Status: DC

## 2020-10-03 MED ORDER — AMIODARONE HCL 200 MG PO TABS: 200.0000 mg | ORAL_TABLET | Freq: Two times a day (BID) | ORAL | Status: DC

## 2020-10-03 MED ORDER — POLYETHYLENE GLYCOL 3350 17 G PO PACK: 17.0000 g | PACK | Freq: Every day | ORAL | 0 refills | Status: AC

## 2020-10-03 MED ORDER — OXYCODONE HCL 5 MG PO TABS: 5.0000 mg | ORAL_TABLET | ORAL | 0 refills | Status: AC | PRN

## 2020-10-03 MED ORDER — TRAMADOL HCL 50 MG PO TABS: 50.0000 mg | ORAL_TABLET | Freq: Four times a day (QID) | ORAL | Status: AC | PRN

## 2020-10-03 MED ORDER — RIVAROXABAN 15 MG PO TABS: 15.0000 mg | ORAL_TABLET | Freq: Every day | ORAL | Status: AC

## 2020-10-03 MED ORDER — CIPROFLOXACIN HCL 500 MG PO TABS: 500.0000 mg | ORAL_TABLET | Freq: Two times a day (BID) | ORAL | Status: AC

## 2020-10-03 MED ORDER — CIPROFLOXACIN HCL 500 MG PO TABS
500.0000 mg | ORAL_TABLET | Freq: Two times a day (BID) | ORAL | Status: DC
  Administered 2020-10-04: 500 mg via ORAL
  Filled 2020-10-03 (×3): qty 1

## 2020-10-03 NOTE — Progress Notes (Signed)
Occupational Therapy Treatment Patient Details Name: [name] [name] MRN: [id number] DOB: 05/11/1929 Today's Date: 10/03/2020    History of present illness Pt is an 87 y.o. female presenting to ED 8/31 with c/o palpitations, not feeling well, and back pain (pt may have fallen 5-6 days prior).  Imaging showing L2 compression fx.  Pt admitted with possible sepsis with UTI, a-fib with RVR, acute exacerbation of chronic systolic CHF, constipation, and new liver lesion.  PMH includes colon CA s/p colectomy, htn, HLD, chronic systolic CHF, GERD, anxiety, mild dementia per son, Colles fx 04/12/20, chronic ulcer L leg, HOH, h/o pelvic fx 2021.   OT comments  [name] [name] reports that her pain is "a little less today." She is able to move sit<>stand and to transfer to/from [facility] with CGA and good balance. Standing balance good with 2-handed support on RW, but required Mod A for peri-care post-toileting, 2/2 fair-poor standing balance with 1-handed support on RW. Will continue to offer OT while hospitalized. Have changed DC recs to SNF, as pt will benefit from STR to increase endurance and strength, to improve posture and balance, and to reduce falls risk, prior to returning to her home. Pt in agreement with this recommendation.    Follow Up Recommendations  SNF    Equipment Recommendations  None recommended by OT    Recommendations for Other Services      Precautions / Restrictions Precautions Precautions: Fall Precaution Comments: L2 compression fx; TLSO brace when OOB (per nurse who clarified with MD) Required Braces or Orthoses: Spinal Brace Spinal Brace: Thoracolumbosacral orthotic Restrictions Weight Bearing Restrictions: No       Mobility Bed Mobility               General bed mobility comments: pt received, left in recliner    Transfers Overall transfer level: Needs assistance Equipment used: Rolling walker (2 wheeled) Transfers: Sit to/from [employer] Sit to Stand: Min guard Stand pivot transfers: Min guard       General transfer comment: Cues for safe hand placement on RW, cues to stand straight    Balance Overall balance assessment: Needs assistance Sitting-balance support: No upper extremity supported;Feet supported Sitting balance-Leahy Scale: Good     Standing balance support: Bilateral upper extremity supported Standing balance-Leahy Scale: Good Standing balance comment: good balance with b/l UE support on RW; fair/poor balance with single-UE support                           ADL either performed or assessed with clinical judgement   ADL Overall ADL's : Needs assistance/impaired                         Toilet Transfer: RW;BSC;Min guard   Toileting- Water quality scientist and Hygiene: Moderate assistance;Sit to/from stand               Vision       Perception     Praxis      Cognition Arousal/Alertness: Awake/alert Behavior During Therapy: WFL for tasks assessed/performed Overall Cognitive Status: Within Functional Limits for tasks assessed                                          Exercises Other Exercises Other Exercises: sit<>stand x 3; ambulation/transfer to [facility] with RW   Shoulder Instructions  General Comments      Pertinent Vitals/ Pain       Pain Score: 2  Pain Location: back Pain Descriptors / Indicators: Guarding  Home Living                                          Prior Functioning/Environment              Frequency  Min 1X/week        Progress Toward Goals  OT Goals(current goals can now be found in the care plan section)  Progress towards OT goals: Progressing toward goals  Acute Rehab OT Goals Patient Stated Goal: to get stronger OT Goal Formulation: With patient Time For Goal Achievement: 10/13/20 Potential to Achieve Goals: Good  Plan Discharge plan needs to be updated;Frequency remains  appropriate    Co-evaluation                 AM-PAC OT "6 Clicks" Daily Activity     Outcome Measure   Help from another person eating meals?: None Help from another person taking care of personal grooming?: A Little Help from another person toileting, which includes using toliet, bedpan, or urinal?: A Lot Help from another person bathing (including washing, rinsing, drying)?: A Little Help from another person to put on and taking off regular upper body clothing?: A Little Help from another person to put on and taking off regular lower body clothing?: A Lot 6 Click Score: 17    End of Session Equipment Utilized During Treatment: Rolling walker  OT Visit Diagnosis: Unsteadiness on feet (R26.81);History of falling (Z91.81);Muscle weakness (generalized) (M62.81)   Activity Tolerance Patient tolerated treatment well   Patient Left in chair;with call bell/phone within reach;with chair alarm set   Nurse Communication          Time: 1450-1505 OT Time Calculation (min): 15 min  Charges: OT General Charges $OT Visit: 1 Visit OT Treatments $Self Care/Home Management : 8-22 mins  Josiah Lobo, PhD, MS, OTR/L 10/03/20, 3:23 PM

## 2020-10-03 NOTE — NC FL2 (Signed)
[place] LEVEL OF CARE SCREENING TOOL     IDENTIFICATION  Patient Name: [name] [name] Birthdate: [birth year] Sex: female Admission Date (Current Location): 09/28/2020  [facility] and [place] Number:  Engineering geologist and Address:  [facility], [address] [place], [place] [address]      Provider Number: [id number]  Attending Physician Name and Address:  Cherene Altes, MD  Relative Name and Phone Number:  Percell Miller (son) [phone]    Current Level of Care: Hospital Recommended Level of Care: [place] Prior Approval Number:    Date Approved/Denied:   PASRR Number:    Discharge Plan: SNF    Current Diagnoses: Patient Active Problem List   Diagnosis Date Noted   Malnutrition of moderate degree 09/30/2020   Palliative care encounter    Atrial fibrillation with RVR ([place]) 09/28/2020   Dementia without behavioral disturbance ([place]) 09/28/2020   Proctitis 09/28/2020   Sepsis ([place]) 09/28/2020   Acute CHF (congestive heart failure) ([place]) 09/09/2020   Pressure injury of skin 08/07/2020   Confusion, postoperative 08/07/2020   Acute pulmonary edema ([place]) 08/07/2020   Diverticular disease 08/07/2020   GERD (gastroesophageal reflux disease) 08/07/2020   History of Bell's palsy 08/07/2020   Mitral regurgitation 08/07/2020   Cancer of ascending colon s/p lap colectomy 08/04/2020 06/01/2020   Colonic mass    Closed intertrochanteric fracture ([place]) 04/12/2020   Colles' fracture 04/12/2020   Osteoarthritis of knee 04/12/2020   Symptomatic anemia 03/25/2020   HLD (hyperlipidemia) 03/25/2020   Bilateral leg edema 02/04/2020   Rapid atrial fibrillation ([place]) 01/11/2020   Chronic anticoagulation 01/11/2020   Chronic ulcer of left leg ([place]) 01/11/2020   Iron deficiency anemia 01/11/2020   Hypokalemia 01/01/2020   Left leg cellulitis 01/01/2020   Iron deficiency anemia due to chronic blood loss 01/01/2020   Wound  cellulitis 12/31/2019   Aortic atherosclerosis ([place]) 09/25/2019   HOH (hard of hearing) 08/31/2019   History of pelvic fracture 08/31/2019   Pelvic fracture (HCC) 08/25/2019   Permanent atrial fibrillation ([place]) 08/25/2019   Pain    Dehydration    Accidental fall 07/26/2019   UTI (urinary tract infection) 07/26/2019   Closed fracture of right inferior pubic ramus ([place]) 07/26/2019   HTN (hypertension) 07/26/2019   Ambulatory dysfunction 07/26/2019   Pubic bone fracture ([place]) 07/26/2019   History of colon polyps 06/25/2017   Constipation, chronic 07/27/2013   Osteopenia 07/27/2013    Orientation RESPIRATION BLADDER Height & Weight     Self, Place, Situation  Normal Incontinent, External catheter Weight: 110 lb 10.7 oz (50.2 kg) Height:  '5\' 3"'$  (160 cm)  BEHAVIORAL SYMPTOMS/MOOD NEUROLOGICAL BOWEL NUTRITION STATUS      Incontinent Diet (see discharge summary)  AMBULATORY STATUS COMMUNICATION OF NEEDS Skin   Limited Assist Verbally Other (Comment) (pressure injury stage 2 coccyx)                       Personal Care Assistance Level of Assistance  Bathing, Feeding, Dressing, Total care Bathing Assistance: Limited assistance Feeding assistance: Independent Dressing Assistance: Limited assistance Total Care Assistance: Limited assistance   Functional Limitations Info  Hearing, Speech, Sight Sight Info: Impaired Hearing Info: Impaired Speech Info: Adequate    SPECIAL CARE FACTORS FREQUENCY  PT (By licensed PT), OT (By licensed OT)     PT Frequency: min 4x weekly OT Frequency: min 4x weekly            Contractures  Contractures Info: Not present    Additional Factors Info  Code Status, Allergies Code Status Info: DNR Allergies Info: Metoprolol, bactrim, ciprofloxacin, pneumococcal vaccines           Current Medications (10/03/2020):  This is the current hospital active medication list Current Facility-Administered Medications  Medication Dose Route Frequency  Provider Last Rate Last Admin   acetaminophen (TYLENOL) tablet 500 mg  500 mg Oral Q8H Cherene Altes, MD   500 mg at 10/03/20 [id number]   acetaminophen (TYLENOL) tablet 500 mg  500 mg Oral Q6H PRN Cherene Altes, MD   500 mg at 10/03/20 0850   amiodarone (PACERONE) tablet 200 mg  200 mg Oral BID Joette Catching T, MD   200 mg at 10/03/20 [id number]   ascorbic acid (VITAMIN C) tablet 250 mg  250 mg Oral BID Joette Catching T, MD   250 mg at 10/03/20 0849   bisacodyl (DULCOLAX) suppository 10 mg  10 mg Rectal Daily PRN Toy Baker, MD   10 mg at 09/30/20 0046   ceFEPIme (MAXIPIME) 2 g in sodium chloride 0.9 % 100 mL IVPB  2 g Intravenous Q12H Joette Catching T, MD 200 mL/hr at 10/03/20 0855 2 g at 10/03/20 0855   diltiazem (CARDIZEM CD) 24 hr capsule 120 mg  120 mg Oral Daily Corey Skains, MD   120 mg at 10/03/20 0848   feeding supplement (BOOST / RESOURCE BREEZE) liquid 1 Container  1 Container Oral TID BM Cherene Altes, MD   1 Container at 10/02/20 2059   furosemide (LASIX) tablet 40 mg  40 mg Oral Daily Joette Catching T, MD   40 mg at 10/03/20 0848   lidocaine (LIDODERM) 5 % 1 patch  1 patch Transdermal Q24H Toy Baker, MD   1 patch at 10/02/20 1716   multivitamin with minerals tablet 1 tablet  1 tablet Oral Daily Cherene Altes, MD   1 tablet at 10/03/20 0848   oxyCODONE (Oxy IR/ROXICODONE) immediate release tablet 5-10 mg  5-10 mg Oral Q4H PRN Cherene Altes, MD       pantoprazole (PROTONIX) EC tablet 40 mg  40 mg Oral BID AC Doutova, Anastassia, MD   40 mg at 10/03/20 0849   polyethylene glycol (MIRALAX / GLYCOLAX) packet 17 g  17 g Oral Daily Doutova, Anastassia, MD   17 g at 10/03/20 0851   potassium chloride SA (KLOR-CON) CR tablet 40 mEq  40 mEq Oral Daily Joette Catching T, MD   40 mEq at 10/03/20 0848   pravastatin (PRAVACHOL) tablet 20 mg  20 mg Oral q1800 Toy Baker, MD   20 mg at 10/02/20 1716   Rivaroxaban (XARELTO) tablet 15 mg  15 mg  Oral Q supper Jettie Booze, PA-C   15 mg at 10/02/20 1734   sacubitril-valsartan (ENTRESTO) 24-26 mg per tablet  1 tablet Oral BID Toy Baker, MD   1 tablet at 10/03/20 0849   senna-docusate (Senokot-S) tablet 1 tablet  1 tablet Oral BID Cherene Altes, MD   1 tablet at 10/03/20 0848   spironolactone (ALDACTONE) tablet 12.5 mg  12.5 mg Oral Daily Doutova, Nyoka Lint, MD   12.5 mg at 10/03/20 0850   traMADol (ULTRAM) tablet 50 mg  50 mg Oral Q6H PRN Toy Baker, MD         Discharge Medications: Please see discharge summary for a list of discharge medications.  Relevant Imaging Results:  Relevant Lab Results:   Additional Information SSN: [id number]  Alberteen Sam, LCSW

## 2020-10-03 NOTE — TOC Initial Note (Addendum)
Transition of Care [facility]) - Initial/Assessment Note    Patient Details  Name: [name] [name] MRN: [id number] Date of Birth: [birth year]  Transition of Care [facility]) CM/SW Contact:    Alberteen Sam, LCSW Phone Number: 10/03/2020, 1:06 PM  Clinical Narrative:                  Update: [facility] reports they are at max capacity and do not have a bed to offer patient. CSW informed son [name] [name], he is in agreement to CSW to fax out referrals to other facilities for bed offers. Informed of potential delays in bed offers/acceptance due to holiday weekend. Edward expressed understanding and MD updated.    CSW spoke with patient's son [name] [name] regarding SNF rec, is in agreement with preference for [facility] as patient has been there before.   CSW has sent referral and reached out to [facility] admissions, pending response if they can accept patient.    Expected Discharge Plan: Skilled Nursing Facility Barriers to Discharge: No Barriers Identified   Patient Goals and CMS Choice   CMS Medicare.gov Compare Post Acute Care list provided to:: Patient Represenative (must comment) (son [name] [name]) Choice offered to / list presented to : Adult Children  Expected Discharge Plan and Services Expected Discharge Plan: [place] Acute Care Choice: [place] Living arrangements for the past 2 months: Single Family Home                                      Prior Living Arrangements/Services Living arrangements for the past 2 months: Single Family Home Lives with:: Adult Children Patient language and need for interpreter reviewed:: Yes Do you feel safe going back to the place where you live?: No   needs short term rehab  Need for Family Participation in Patient Care: Yes (Comment) Care giver support system in place?: Yes (comment)   Criminal Activity/Legal Involvement Pertinent to Current Situation/Hospitalization: No - Comment as needed  Activities of  Daily Living Home Assistive Devices/Equipment: Hearing aid ADL Screening (condition at time of admission) Patient's cognitive ability adequate to safely complete daily activities?: Yes Is the patient deaf or have difficulty hearing?: Yes Does the patient have difficulty seeing, even when wearing glasses/contacts?: No Does the patient have difficulty concentrating, remembering, or making decisions?: No Patient able to express need for assistance with ADLs?: Yes Does the patient have difficulty dressing or bathing?: Yes Independently performs ADLs?: Yes (appropriate for developmental age) Does the patient have difficulty walking or climbing stairs?: Yes Weakness of Legs: Both Weakness of Arms/Hands: Both  Permission Sought/Granted Permission sought to share information with : Case Manager, Customer service manager, Family Supports Permission granted to share information with : Yes, Verbal Permission Granted  Share Information with NAME: [name] [name]  Permission granted to share info w AGENCY: SNFs  Permission granted to share info w Relationship: son  Permission granted to share info w Contact Information: [phone]  Emotional Assessment Appearance:: Appears stated age       Alcohol / Substance Use: Not Applicable Psych Involvement: No (comment)  Admission diagnosis:  Hypokalemia [E87.6] A-fib ([place]) [I48.91] Acute cystitis without hematuria [N30.00] Atrial fibrillation with RVR ([place]) [I48.91] Acute on chronic congestive heart failure, unspecified heart failure type ([place]) [I50.9] Compression fracture of L2 vertebra, initial encounter [facility]) [S32.020A] Patient Active Problem List   Diagnosis Date Noted   Malnutrition  of moderate degree 09/30/2020   Palliative care encounter    Atrial fibrillation with RVR ([place]) 09/28/2020   Dementia without behavioral disturbance ([place]) 09/28/2020   Proctitis 09/28/2020   Sepsis ([place]) 09/28/2020   Acute CHF (congestive heart failure) ([place])  09/09/2020   Pressure injury of skin 08/07/2020   Confusion, postoperative 08/07/2020   Acute pulmonary edema ([place]) 08/07/2020   Diverticular disease 08/07/2020   GERD (gastroesophageal reflux disease) 08/07/2020   History of Bell's palsy 08/07/2020   Mitral regurgitation 08/07/2020   Cancer of ascending colon s/p lap colectomy 08/04/2020 06/01/2020   Colonic mass    Closed intertrochanteric fracture ([place]) 04/12/2020   Colles' fracture 04/12/2020   Osteoarthritis of knee 04/12/2020   Symptomatic anemia 03/25/2020   HLD (hyperlipidemia) 03/25/2020   Bilateral leg edema 02/04/2020   Rapid atrial fibrillation ([place]) 01/11/2020   Chronic anticoagulation 01/11/2020   Chronic ulcer of left leg ([place]) 01/11/2020   Iron deficiency anemia 01/11/2020   Hypokalemia 01/01/2020   Left leg cellulitis 01/01/2020   Iron deficiency anemia due to chronic blood loss 01/01/2020   Wound cellulitis 12/31/2019   Aortic atherosclerosis ([place]) 09/25/2019   HOH (hard of hearing) 08/31/2019   History of pelvic fracture 08/31/2019   Pelvic fracture ([place]) 08/25/2019   Permanent atrial fibrillation ([place]) 08/25/2019   Pain    Dehydration    Accidental fall 07/26/2019   UTI (urinary tract infection) 07/26/2019   Closed fracture of right inferior pubic ramus ([place]) 07/26/2019   HTN (hypertension) 07/26/2019   Ambulatory dysfunction 07/26/2019   Pubic bone fracture ([place]) 07/26/2019   History of colon polyps 06/25/2017   Constipation, chronic 07/27/2013   Osteopenia 07/27/2013   PCP:  [name], MD Pharmacy:   [facility] [place] [facility] [address] [place] [place] [address] Phone: [phone] Fax: [phone]     Social Determinants of Health (SDOH) Interventions    Readmission Risk Interventions Readmission Risk Prevention Plan 09/11/2020  Transportation Screening Complete  PCP or Specialist Appt within 3-5 Days Complete  HRI or [place] Complete   Social Work Consult for [place] Planning/Counseling Complete  Palliative Care Screening Complete  Medication Review Press photographer) Complete  Some recent data might be hidden

## 2020-10-03 NOTE — Discharge Summary (Addendum)
DISCHARGE SUMMARY  [name] [name]  MR#: [id number]  DOB:28-Aug-1929  Date of Admission: 09/28/2020 Date of Discharge: 10/03/2020  Attending Physician:Iva Posten Hennie Duos, MD  Patient's [id number], Lucianne Muss, MD  Consults: Oncology Cardiology  Disposition: D/C to SNF   Follow-up Appts:  Follow-up Information     Corey Skains, MD Follow up in 10 day(s).   Specialty: Cardiology Contact information: [facility] [place] [address] [phone]                 Tests Needing Follow-up: -monitor HR control w/ new diagnosis of Afib  -monitor volume/weight closely in elderly patient w/ CHF on diuretic  -decrease amiodarone dose in outpt f/u  -monitor renal fxn and K+ on diuretic tx   Discharge Diagnoses: Newly diagnosed acute atrial fibrillation with acute RVR Acute exacerbation of chronic systolic CHF Acute L2 compression fracture Stage III colon cancer status post colectomy - new hepatic lesion Pseudomonas UTI - possible proctitis Hypokalemia Constipation Dementia  Initial presentation: 87 year old with a history of colon cancer status post colectomy, HTN, HLD, chronic systolic CHF (EF [id number]), GERD, anxiety, and dementia who was brought to the ER with palpitations malaise generalized weakness and back pain. In the ER she was found to be in A. fib with RVR.  She was also diagnosed with an acute L2 compression fracture, constipation, UTI, and possible proctitis as noted on a CT abdomen pelvis.  Hospital Course:  Newly diagnosed acute atrial fibrillation with acute RVR Has required amiodarone drip as well as Cardizem infusion - CHA2DS2-VASc is 5 - continue Xarelto - has a history of beta-blocker intolerance -rate presently well controlled -transitioned to oral amiodarone prior to d/c w/ BID dosing - to be decreased to QD dosing in outpt f/u w/ Cardiology    Acute exacerbation of chronic systolic CHF EF [id number] via  TTE July 2022 - dosed w/ Lasix, Aldactone, Entresto - appears compensated at time of d/c - net -2700 cc since admission - mildly volume depleted 9/5 (w/ poor intake) so lasix dose decreased from '40mg'$  to '20mg'$    Acute L2 compression fracture Continue pain management - PT/OT - TLSO brace for comfort and support prn - will require SNF placement for rehabilitation   Stage III colon cancer status post colectomy -new hepatic lesion New 1.9 cm right hepatic lobe lesion noted this admission - Oncology has evaluated with plans for outpatient MRI   Pseudomonas UTI - possible proctitis Antibiotic adjusted -no concerning findings on sensitivity testing -clinically stabilizing - transition to oral abx on day of d/c to complete a 7 day course of tx    Hypokalemia Due to poor intake and diuresis-corrected with supplementation   Constipation Continue bowel regimen   Dementia Complicates effective treatment and communication  Allergies as of 10/03/2020       Reactions   Metoprolol Swelling   Feet, ankles and legs    Bactrim [sulfamethoxazole-trimethoprim] Other (See Comments)   Hearing loss   Ciprofloxacin Other (See Comments)   Flu-like symptoms   Pneumococcal Vaccines Other (See Comments)   Flu-like symptoms        Medication List     STOP taking these medications    diltiazem 60 MG tablet Commonly known as: CARDIZEM   polyethylene glycol powder 17 GM/SCOOP powder Commonly known as: GLYCOLAX/MIRALAX Replaced by: polyethylene glycol 17 g packet       TAKE these medications    acetaminophen 500 MG tablet Commonly known as: TYLENOL Take 1 tablet (  500 mg total) by mouth every 6 (six) hours as needed for mild pain (or Fever >/= 101).   amiodarone 200 MG tablet Commonly known as: PACERONE Take 1 tablet (200 mg total) by mouth 2 (two) times daily.   bisacodyl 10 MG suppository Commonly known as: DULCOLAX Place 1 suppository (10 mg total) rectally daily as needed for moderate  constipation.   calcium carbonate 500 MG chewable tablet Commonly known as: TUMS - dosed in mg elemental calcium Chew 500 mg by mouth daily as needed for indigestion or heartburn.   ciprofloxacin 500 MG tablet Commonly known as: CIPRO Take 1 tablet (500 mg total) by mouth 2 (two) times daily for 4 days. Start taking on: October 04, 2020   diltiazem 120 MG 24 hr capsule Commonly known as: CARDIZEM CD Take 1 capsule (120 mg total) by mouth daily. Start taking on: October 04, 2020   fluticasone 50 MCG/ACT nasal spray Commonly known as: FLONASE Place 1 spray into both nostrils daily.   furosemide 20 MG tablet Commonly known as: LASIX Take 1 tablet (20 mg total) by mouth daily. What changed:  medication strength how much to take   iron polysaccharides 150 MG capsule Commonly known as: NIFEREX Take 1 capsule (150 mg total) by mouth daily.   lovastatin 20 MG tablet Commonly known as: MEVACOR Take 20 mg by mouth at bedtime.   oxyCODONE 5 MG immediate release tablet Commonly known as: Oxy IR/ROXICODONE Take 1-2 tablets (5-10 mg total) by mouth every 4 (four) hours as needed for severe pain.   pantoprazole 40 MG tablet Commonly known as: PROTONIX Take 1 tablet (40 mg total) by mouth 2 (two) times daily before a meal. What changed:  when to take this reasons to take this   polyethylene glycol 17 g packet Commonly known as: MIRALAX / GLYCOLAX Take 17 g by mouth daily. Start taking on: October 04, 2020 Replaces: polyethylene glycol powder 17 GM/SCOOP powder   Rivaroxaban 15 MG Tabs tablet Commonly known as: XARELTO Take 1 tablet (15 mg total) by mouth daily with supper. Start taking on: October 04, 2020   sacubitril-valsartan 24-26 MG Commonly known as: ENTRESTO Take 1 tablet by mouth 2 (two) times daily.   senna-docusate 8.6-50 MG tablet Commonly known as: Senokot-S Take 1 tablet by mouth 2 (two) times daily.   spironolactone 25 MG tablet Commonly known as:  ALDACTONE Take 0.5 tablets (12.5 mg total) by mouth daily. Start taking on: October 04, 2020   SYSTANE OP Place 1 drop into both eyes in the morning and at bedtime.   traMADol 50 MG tablet Commonly known as: ULTRAM Take 1 tablet (50 mg total) by mouth every 6 (six) hours as needed for moderate pain. What changed: reasons to take this   tretinoin 0.05 % cream Commonly known as: RETIN-A Apply 1 application topically at bedtime.         Day of Discharge BP 121/64 (BP Location: Left Arm)   Pulse 81   Temp 98.4 F (36.9 C) (Oral)   Resp 16   Ht '5\' 3"'$  (1.6 m)   Wt 50.2 kg   SpO2 97%   BMI 19.60 kg/m   Physical Exam: General: No acute respiratory distress Lungs: Clear to auscultation bilaterally without wheezes or crackles Cardiovascular: Regular rate and rhythm without murmur gallop or rub normal S1 and S2 Abdomen: Nontender, nondistended, soft, bowel sounds positive, no rebound, no ascites, no appreciable mass Extremities: No significant cyanosis, clubbing, or edema bilateral lower extremities  Basic Metabolic Panel: Recent Labs  Lab 09/28/20 1420 09/28/20 1646 09/29/20 0651 09/29/20 1153 09/30/20 0622 10/01/20 0401 10/03/20 0519  NA 140  --  139 137 138 136 134*  K 3.1*  --  3.5 3.8 3.2* 4.6 4.7  CL 104  --  100 99 104 105 102  CO2 24  --  '27 28 27 25 23  '$ GLUCOSE 116*  --  108* 150* 104* 159* 109*  BUN 17  --  '18 17 19 18 '$ 26*  CREATININE 0.78  --  0.89 0.83 0.69 0.72 0.81  CALCIUM 9.4  --  9.1 9.3 8.8* 8.6* 9.3  MG 2.2  --  2.2 2.2 2.1  --   --   PHOS  --  3.6  --  4.1  --   --   --     Liver Function Tests: Recent Labs  Lab 09/28/20 1646 09/29/20 1153  AST 19 26  ALT 14 14  ALKPHOS 94 100  BILITOT 0.9 0.8  PROT 7.7 8.3*  ALBUMIN 3.3* 3.5    Coags: Recent Labs  Lab 09/28/20 1420  INR 1.1    CBC: Recent Labs  Lab 09/28/20 1420 09/29/20 1153 10/03/20 0519  WBC 14.3* 9.2 8.5  NEUTROABS  --  7.0  --   HGB 15.2* 15.0 14.5  HCT 46.9*  46.7* 45.4  MCV 90.5 90.2 91.0  PLT 366 349 347    Cardiac Enzymes: Recent Labs  Lab 09/28/20 1646  CKTOTAL 22*   BNP (last 3 results) Recent Labs    08/07/20 0457 09/11/20 0546 09/28/20 1420  BNP 2,411.8* 1,842.6* 1,687.6*    Recent Results (from the past 240 hour(s))  Resp Panel by RT-PCR (Flu A&B, Covid) Nasopharyngeal Swab     Status: None   Collection Time: 09/28/20  3:19 PM   Specimen: Nasopharyngeal Swab; Nasopharyngeal(NP) swabs in vial transport medium  Result Value Ref Range Status   SARS Coronavirus 2 by RT PCR NEGATIVE NEGATIVE Final    Comment: (NOTE) SARS-CoV-2 target nucleic acids are NOT DETECTED.  The SARS-CoV-2 RNA is generally detectable in upper respiratory specimens during the acute phase of infection. The lowest concentration of SARS-CoV-2 viral copies this assay can detect is 138 copies/mL. A negative result does not preclude SARS-Cov-2 infection and should not be used as the sole basis for treatment or other patient management decisions. A negative result may occur with  improper specimen collection/handling, submission of specimen other than nasopharyngeal swab, presence of viral mutation(s) within the areas targeted by this assay, and inadequate number of viral copies(<138 copies/mL). A negative result must be combined with clinical observations, patient history, and epidemiological information. The expected result is Negative.  Fact Sheet for Patients:  EntrepreneurPulse.com.au  Fact Sheet for Healthcare Providers:  IncredibleEmployment.be  This test is no t yet approved or cleared by the [place] FDA and  has been authorized for detection and/or diagnosis of SARS-CoV-2 by FDA under an Emergency Use Authorization (EUA). This EUA will remain  in effect (meaning this test can be used) for the duration of the COVID-19 declaration under Section 564(b)(1) of the Act, 21 U.S.C.section 360bbb-3(b)(1),  unless the authorization is terminated  or revoked sooner.       Influenza A by PCR NEGATIVE NEGATIVE Final   Influenza B by PCR NEGATIVE NEGATIVE Final    Comment: (NOTE) The Xpert Xpress SARS-CoV-2/FLU/RSV plus assay is intended as an aid in the diagnosis of influenza from Nasopharyngeal swab specimens and should not  be used as a sole basis for treatment. Nasal washings and aspirates are unacceptable for Xpert Xpress SARS-CoV-2/FLU/RSV testing.  Fact Sheet for Patients: EntrepreneurPulse.com.au  Fact Sheet for Healthcare Providers: IncredibleEmployment.be  This test is not yet approved or cleared by the [place] FDA and has been authorized for detection and/or diagnosis of SARS-CoV-2 by FDA under an Emergency Use Authorization (EUA). This EUA will remain in effect (meaning this test can be used) for the duration of the COVID-19 declaration under Section 564(b)(1) of the Act, 21 U.S.C. section 360bbb-3(b)(1), unless the authorization is terminated or revoked.  Performed at [facility], [address] [place], [place] [address]   Blood culture (routine single)     Status: None   Collection Time: 09/28/20  3:19 PM   Specimen: BLOOD  Result Value Ref Range Status   Specimen Description BLOOD LEFT ANTECUBITAL  Final   Special Requests   Final    BOTTLES DRAWN AEROBIC AND ANAEROBIC Blood Culture results may not be optimal due to an inadequate volume of blood received in culture bottles   Culture   Final    NO GROWTH 5 DAYS Performed at [facility], [address], [place], [place] [address]    Report Status 10/03/2020 FINAL  Final  Urine Culture     Status: Abnormal   Collection Time: 09/28/20  3:21 PM   Specimen: Urine, Random  Result Value Ref Range Status   Specimen Description   Final    URINE, RANDOM Performed at [facility], [address], [place], [place] [address]    Special Requests    Final    NONE Performed at [facility], [address] [place], [place] [address]    Culture >=100,000 COLONIES/mL PSEUDOMONAS AERUGINOSA (A)  Final   Report Status 10/01/2020 FINAL  Final   Organism ID, Bacteria PSEUDOMONAS AERUGINOSA (A)  Final      Susceptibility   Pseudomonas aeruginosa - MIC*    CEFTAZIDIME 4 SENSITIVE Sensitive     CIPROFLOXACIN <=0.25 SENSITIVE Sensitive     GENTAMICIN <=1 SENSITIVE Sensitive     IMIPENEM 1 SENSITIVE Sensitive     PIP/TAZO 8 SENSITIVE Sensitive     CEFEPIME 2 SENSITIVE Sensitive     * >=100,000 COLONIES/mL PSEUDOMONAS AERUGINOSA  MRSA Next Gen by PCR, Nasal     Status: None   Collection Time: 09/30/20  4:08 AM   Specimen: Nasal Mucosa; Nasal Swab  Result Value Ref Range Status   MRSA by PCR Next Gen NOT DETECTED NOT DETECTED Final    Comment: (NOTE) The GeneXpert MRSA Assay (FDA approved for NASAL specimens only), is one component of a comprehensive MRSA colonization surveillance program. It is not intended to diagnose MRSA infection nor to guide or monitor treatment for MRSA infections. Test performance is not FDA approved in patients less than 15 years old. Performed at [facility], [address] [place], [place] [address]     Time spent on D/C process: 35 minutes  10/03/2020, 5:36 PM   Cherene Altes, MD [facility] Office  [phone]

## 2020-10-03 NOTE — Progress Notes (Signed)
Physical Therapy Treatment Patient Details Name: [name] [name] MRN: [id number] DOB: [birth year] Today's Date: 10/03/2020    History of Present Illness Pt is an 87 y.o. female presenting to ED 8/31 with c/o palpitations, not feeling well, and back pain (pt may have fallen 5-6 days prior).  Imaging showing L2 compression fx.  Pt admitted with possible sepsis with UTI, a-fib with RVR, acute exacerbation of chronic systolic CHF, constipation, and new liver lesion.  PMH includes colon CA s/p colectomy, htn, HLD, chronic systolic CHF, GERD, anxiety, mild dementia per son, Colles fx 04/12/20, chronic ulcer L leg, HOH, h/o pelvic fx 2021.    PT Comments    Pt is making improved progress towards goals with ability to ambulate further distances this date although does fatigue quickly with ambulation. Good endurance with HEP. Still recommend SNF at this time.  Follow Up Recommendations  SNF     Equipment Recommendations   (TBD)    Recommendations for Other Services OT consult     Precautions / Restrictions Precautions Precautions: Fall;Back Precaution Booklet Issued: No Precaution Comments: L2 compression fx; TLSO brace when OOB (per nurse who clarified with MD) Required Braces or Orthoses: Spinal Brace Spinal Brace: Thoracolumbosacral orthotic Restrictions Weight Bearing Restrictions: No    Mobility  Bed Mobility Overal bed mobility: Needs Assistance Bed Mobility: Rolling;Supine to Sit Rolling: Min assist   Supine to sit: Min assist     General bed mobility comments: improved technique this date and pt able to follow commands for safe technique. Once seated, able to don TLSO with assist    Transfers Overall transfer level: Needs assistance Equipment used: Rolling walker (2 wheeled) Transfers: Sit to/from Stand Sit to Stand: Min assist         General transfer comment: needs cues for hand placement. Once standing, upright posture noted using RW for  assist.  Ambulation/Gait Ambulation/Gait assistance: Min assist Gait Distance (Feet): 50 Feet Assistive device: Rolling walker (2 wheeled) Gait Pattern/deviations: Step-through pattern     General Gait Details: decreased hip stability and short reciprocal gait pattern. Fatigues with exertion with mod assist required towards end of distance due to fatigue. Cues for keeping RW closer to body   Stairs             Wheelchair Mobility    Modified Rankin (Stroke Patients Only)       Balance Overall balance assessment: Needs assistance Sitting-balance support: No upper extremity supported;Feet supported Sitting balance-Leahy Scale: Good     Standing balance support: Bilateral upper extremity supported Standing balance-Leahy Scale: Fair                              Cognition Arousal/Alertness: Awake/alert Behavior During Therapy: WFL for tasks assessed/performed Overall Cognitive Status: Within Functional Limits for tasks assessed                                 General Comments: awake and alert. Pt reports receiving pain meds prior to therapy. No pain at this time      Exercises Other Exercises Other Exercises: supine ther-ex performed on B LE including hip add squeezes, SLRs, and heel slides. 2x5 reps with cga. Other Exercises: ambulated to bathroom to void. Min assist for transfers and hygiene. Rw used with quick fatigue during ambulation to bathroom    General Comments  Pertinent Vitals/Pain Pain Assessment: No/denies pain    Home Living                      Prior Function            PT Goals (current goals can now be found in the care plan section) Acute Rehab PT Goals Patient Stated Goal: none stated PT Goal Formulation: With patient Time For Goal Achievement: 10/14/20 Potential to Achieve Goals: Fair Progress towards PT goals: Progressing toward goals    Frequency    7X/week      PT Plan Current  plan remains appropriate    Co-evaluation              AM-PAC PT "6 Clicks" Mobility   Outcome Measure  Help needed turning from your back to your side while in a flat bed without using bedrails?: None Help needed moving from lying on your back to sitting on the side of a flat bed without using bedrails?: A Little Help needed moving to and from a bed to a chair (including a wheelchair)?: A Little Help needed standing up from a chair using your arms (e.g., wheelchair or bedside chair)?: A Little Help needed to walk in hospital room?: A Lot Help needed climbing 3-5 steps with a railing? : Total 6 Click Score: 16    End of Session   Activity Tolerance: Patient tolerated treatment well Patient left: in chair;with chair alarm set Nurse Communication: Mobility status PT Visit Diagnosis: Muscle weakness (generalized) (M62.81);Other abnormalities of gait and mobility (R26.89)     Time: [id number] PT Time Calculation (min) (ACUTE ONLY): 39 min  Charges:  $Gait Training: 8-22 mins $Therapeutic Exercise: 8-22 mins $Therapeutic Activity: 8-22 mins                     Greggory Stallion, PT, DPT [phone]    [name] [name] 10/03/2020, 12:16 PM

## 2020-10-04 LAB — RESP PANEL BY RT-PCR (FLU A&B, COVID) ARPGX2
Influenza A by PCR: NEGATIVE
Influenza B by PCR: NEGATIVE
SARS Coronavirus 2 by RT PCR: NEGATIVE

## 2020-10-04 LAB — BASIC METABOLIC PANEL
Anion gap: 10 (ref 5–15)
BUN: 32 mg/dL — ABNORMAL HIGH (ref 8–23)
CO2: 23 mmol/L (ref 22–32)
Calcium: 9.5 mg/dL (ref 8.9–10.3)
Chloride: 101 mmol/L (ref 98–111)
Creatinine, Ser: 0.72 mg/dL (ref 0.44–1.00)
GFR, Estimated: >60 mL/min (ref >60)
Glucose, Bld: 113 mg/dL — ABNORMAL HIGH (ref 70–99)
Potassium: 4.7 mmol/L (ref 3.5–5.1)
Sodium: 134 mmol/L — ABNORMAL LOW (ref 135–145)

## 2020-10-04 NOTE — Progress Notes (Signed)
Physical Therapy Treatment Patient Details Name: [name] [name] MRN: [id number] DOB: 1929/11/16 Today's Date: 10/04/2020    History of Present Illness Pt is an 87 y.o. female presenting to ED 8/31 with c/o palpitations, not feeling well, and back pain (pt may have fallen 5-6 days prior).  Imaging showing L2 compression fx.  Pt admitted with possible sepsis with UTI, a-fib with RVR, acute exacerbation of chronic systolic CHF, constipation, and new liver lesion.  PMH includes colon CA s/p colectomy, htn, HLD, chronic systolic CHF, GERD, anxiety, mild dementia per son, Colles fx 04/12/20, chronic ulcer L leg, HOH, h/o pelvic fx 2021.    PT Comments    Pt was asleep in long sitting upon arriving. Takes encouragement to awake and agree to PT session. Continued to be lethargic throughout most of session. Author assisted pt with hearing aides prior to pt performing log roll to short sit. She required max assistance + vcs for properly applying  spinal brace. Pt requested to use BR. Upon ambulation to BR, pt has several incontinent episodes. Assisted with clean up prior to pt ambulating back to recliner. 2 x 25 ft with RW with min assist for safety and navigation of RW. Overall pt severely limited by lethargy and fatigue. At conclusion of session, pt was sitting in recliner (purewick in place) with BLEs elevated, breakfast tray in front of her and chair alarm in place. Acute PT will continue to follow and progress as able per current POC.   Follow Up Recommendations  SNF     Equipment Recommendations  Other (comment) (defer to next level of care)       Precautions / Restrictions Precautions Precautions: Fall Precaution Booklet Issued: No Precaution Comments: L2 compression fx; TLSO brace when OOB (per nurse who clarified with MD) Required Braces or Orthoses: Spinal Brace Spinal Brace: Thoracolumbosacral orthotic Restrictions Weight Bearing Restrictions: No    Mobility  Bed Mobility Overal  bed mobility: Needs Assistance Bed Mobility: Rolling;Supine to Sit Rolling: Min assist Sidelying to sit: Min assist       General bed mobility comments: min assist to achieve EOB short sit with vcs for log roll technique    Transfers Overall transfer level: Needs assistance Equipment used: Rolling walker (2 wheeled) Transfers: Sit to/from Stand Sit to Stand: Min guard         General transfer comment: CGA for safety. required total assist to properly apply TLSO brace  Ambulation/Gait Ambulation/Gait assistance: Min assist Gait Distance (Feet): 25 Feet Assistive device: Rolling walker (2 wheeled) Gait Pattern/deviations: Step-through pattern Gait velocity: decreased   General Gait Details: pt ambulate to BR and then return. very lethargic throughout session. Pt is extremely incontinent and urinated with ambulation. recommend use of depend/brief/diaper next session    Balance Overall balance assessment: Needs assistance Sitting-balance support: No upper extremity supported;Feet supported Sitting balance-Leahy Scale: Good Sitting balance - Comments: steady sitting reaching within BOS   Standing balance support: Bilateral upper extremity supported;During functional activity Standing balance-Leahy Scale: Good Standing balance comment: no balance deficits with BUE support    Cognition Arousal/Alertness: Awake/alert Behavior During Therapy: WFL for tasks assessed/performed Overall Cognitive Status: Within Functional Limits for tasks assessed      General Comments: awake and alert. Pt reports receiving pain meds prior to therapy. No pain at this time             Pertinent Vitals/Pain Pain Assessment: 0-10 Pain Score: 3  Faces Pain Scale: Hurts a little bit Pain Location: back  Pain Descriptors / Indicators: Guarding Pain Intervention(s): Limited activity within patient's tolerance;Monitored during session;Repositioned     PT Goals (current goals can now be found in  the care plan section) Acute Rehab PT Goals Patient Stated Goal: none stated Progress towards PT goals: Progressing toward goals    Frequency    7X/week      PT Plan Current plan remains appropriate       AM-PAC PT "6 Clicks" Mobility   Outcome Measure  Help needed turning from your back to your side while in a flat bed without using bedrails?: A Little Help needed moving from lying on your back to sitting on the side of a flat bed without using bedrails?: A Little Help needed moving to and from a bed to a chair (including a wheelchair)?: A Little Help needed standing up from a chair using your arms (e.g., wheelchair or bedside chair)?: A Little Help needed to walk in hospital room?: A Little Help needed climbing 3-5 steps with a railing? : A Lot 6 Click Score: 17    End of Session Equipment Utilized During Treatment: Gait belt Activity Tolerance: Patient tolerated treatment well Patient left: in chair;with chair alarm set Nurse Communication: Mobility status PT Visit Diagnosis: Muscle weakness (generalized) (M62.81);Other abnormalities of gait and mobility (R26.89)     Time: [id number] PT Time Calculation (min) (ACUTE ONLY): 25 min  Charges:  $Gait Training: 8-22 mins $Therapeutic Activity: 8-22 mins                     Julaine Fusi PTA 10/04/20, 9:49 AM

## 2020-10-04 NOTE — Discharge Summary (Addendum)
Physician Discharge Summary  [name] [name] [id number] DOB: November 05, 1929 DOA: 09/28/2020  PCP: [name], MD  Admit date: 09/28/2020 Discharge date: 10/04/2020  Discharge disposition: SNF   Recommendations for Outpatient Follow-Up:   Follow up with Dr. Nehemiah Massed, cardiologist, in 10 days -monitor HR control w/ new diagnosis of Afib  -monitor volume/weight closely in elderly patient w/ CHF on diuretic  -decrease amiodarone dose in outpt f/u  -monitor renal fxn and K+ on diuretic tx    Discharge Diagnosis:   Active Problems:   UTI (urinary tract infection)   HTN (hypertension)   Hypokalemia   HLD (hyperlipidemia)   Constipation, chronic   Cancer of ascending colon s/p lap colectomy 08/04/2020   Decubitus ulcer of coccygeal region, stage 2 (HCC)   GERD (gastroesophageal reflux disease)   Acute CHF (congestive heart failure) (HCC)   Atrial fibrillation with RVR ([place])   Dementia without behavioral disturbance ([place])   Proctitis   Malnutrition of moderate degree    Discharge Condition: Stable.  Diet recommendation:  Diet Order             Diet - low sodium heart healthy           Diet regular Room service appropriate? Yes; Fluid consistency: Thin  Diet effective now                     Code Status: DNR     Hospital Course:   Ms. [name] [name] is an 87 year old woman  with a history of colon cancer status post colectomy, HTN, HLD, chronic systolic CHF (EF [id number]), GERD, anxiety, and dementia who was brought to the ER with palpitations malaise generalized weakness and back pain. In the ER she was found to be in A. fib with RVR.  She was also diagnosed with an acute L2 compression fracture, constipation, UTI, and possible proctitis as noted on a CT abdomen pelvis. Sepsis was ruled out.      Newly diagnosed acute atrial fibrillation with acute RVR Has required amiodarone drip as well as Cardizem infusion - CHA2DS2-VASc is 5 - continue Xarelto -  has a history of beta-blocker intolerance -rate presently well controlled -transitioned to oral amiodarone prior to d/c w/ BID dosing - to be decreased to QD dosing in outpt f/u w/ Cardiology    Acute exacerbation of chronic systolic CHF EF [id number] via TTE July 2022 - dosed w/ Lasix, Aldactone, Entresto compensated.  Lasix dose has been decreased from 40 to 20 mg daily.   Acute L2 compression fracture Continue pain management.  Continue PT/OT - TLSO brace for comfort and support prn    Stage III colon cancer status post colectomy -new hepatic lesion New 1.9 cm right hepatic lobe lesion noted this admission - Oncology has evaluated with plans for outpatient MRI   Pseudomonas UTI - possible proctitis Completed treatment.   Hypokalemia Improved  Constipation Improved.  Continue laxatives as needed.   Dementia Complicates effective treatment and communication   Medical Consultants:   Cardiologist Oncologist   Discharge Exam:    Vitals:   10/03/20 2342 10/04/20 0430 10/04/20 0813 10/04/20 1132  BP: (!) 136/57 (!) 132/59 (!) 147/67 (!) 125/54  Pulse: 77 73 81 79  Resp: '16 16 15 18  '$ Temp: 98.1 F (36.7 C) 98.3 F (36.8 C) 97.6 F (36.4 C) (!) 97.4 F (36.3 C)  TempSrc:   Oral Oral  SpO2: 98% 98% 98% 99%  Weight:      Height:  GEN: NAD SKIN: Warm and dry.  Erythema of bilateral legs. Stage 2 coccygeal decubitus ulcer (present on admission) EYES: Pallor or icterus ENT: MMM CV: RRR PULM: CTA B ABD: soft, ND, NT, +BS CNS: AAO x 2 (person and place), non focal EXT: No edema or tenderness   The results of significant diagnostics from this hospitalization (including imaging, microbiology, ancillary and laboratory) are listed below for reference.     Procedures and Diagnostic Studies:   CT HEAD WO CONTRAST (5MM)  Result Date: 09/28/2020 CLINICAL DATA:  Atrial fibrillation. Taken off blood thinners due to operation. Back pain and generalized weakness for  couple days. Tachycardic to 140. EXAM: CT HEAD WITHOUT CONTRAST CT CERVICAL SPINE WITHOUT CONTRAST TECHNIQUE: Multidetector CT imaging of the head and cervical spine was performed following the standard protocol without intravenous contrast. Multiplanar CT image reconstructions of the cervical spine were also generated. COMPARISON:  CT chest 03/28/2020. FINDINGS: CT HEAD FINDINGS BRAIN: BRAIN Prominence of the lateral ventricles may be related to central predominant atrophy, although a component of normal pressure/communicating hydrocephalus cannot be excluded. Patchy and confluent areas of decreased attenuation are noted throughout the deep and periventricular white matter of the cerebral hemispheres bilaterally, compatible with chronic microvascular ischemic disease. No evidence of large-territorial acute infarction. No parenchymal hemorrhage. No mass lesion. No extra-axial collection. No mass effect or midline shift. No hydrocephalus. Basilar cisterns are patent. Vascular: No hyperdense vessel. Atherosclerotic calcifications are present within the cavernous internal carotid arteries. Skull: No acute fracture or focal lesion. Sinuses/Orbits: Paranasal sinuses and mastoid air cells are clear. Bilateral lens replacement. Otherwise orbits are unremarkable. Other: None. CT CERVICAL SPINE FINDINGS Alignment: Normal. Skull base and vertebrae: Amorphous calcification surrounding the C7 and T1 spinous processes (6:35) of unclear etiology but similar to prior CT chest. No acute fracture. No aggressive appearing focal osseous lesion or focal pathologic process. Soft tissues and spinal canal: No prevertebral fluid or swelling. No visible canal hematoma. Upper chest: Unremarkable. Other: A 0.8 cm hypodensity within the left thyroid gland. Not clinically significant; no follow-up imaging recommended (ref: J Am Coll Radiol. 2015 Feb;12(2): 143-50). IMPRESSION: 1. No acute intracranial abnormality. 2. Prominence of the lateral  ventricles may be related to central predominant atrophy, although a component of normal pressure/communicating hydrocephalus cannot be excluded. 3. No acute displaced fracture or traumatic listhesis of the cervical spine. Electronically Signed   By: Iven Finn M.D.   On: 09/28/2020 17:58   CT Cervical Spine Wo Contrast  Result Date: 09/28/2020 CLINICAL DATA:  Atrial fibrillation. Taken off blood thinners due to operation. Back pain and generalized weakness for couple days. Tachycardic to 140. EXAM: CT HEAD WITHOUT CONTRAST CT CERVICAL SPINE WITHOUT CONTRAST TECHNIQUE: Multidetector CT imaging of the head and cervical spine was performed following the standard protocol without intravenous contrast. Multiplanar CT image reconstructions of the cervical spine were also generated. COMPARISON:  CT chest 03/28/2020. FINDINGS: CT HEAD FINDINGS BRAIN: BRAIN Prominence of the lateral ventricles may be related to central predominant atrophy, although a component of normal pressure/communicating hydrocephalus cannot be excluded. Patchy and confluent areas of decreased attenuation are noted throughout the deep and periventricular white matter of the cerebral hemispheres bilaterally, compatible with chronic microvascular ischemic disease. No evidence of large-territorial acute infarction. No parenchymal hemorrhage. No mass lesion. No extra-axial collection. No mass effect or midline shift. No hydrocephalus. Basilar cisterns are patent. Vascular: No hyperdense vessel. Atherosclerotic calcifications are present within the cavernous internal carotid arteries. Skull: No acute fracture or focal  lesion. Sinuses/Orbits: Paranasal sinuses and mastoid air cells are clear. Bilateral lens replacement. Otherwise orbits are unremarkable. Other: None. CT CERVICAL SPINE FINDINGS Alignment: Normal. Skull base and vertebrae: Amorphous calcification surrounding the C7 and T1 spinous processes (6:35) of unclear etiology but similar to  prior CT chest. No acute fracture. No aggressive appearing focal osseous lesion or focal pathologic process. Soft tissues and spinal canal: No prevertebral fluid or swelling. No visible canal hematoma. Upper chest: Unremarkable. Other: A 0.8 cm hypodensity within the left thyroid gland. Not clinically significant; no follow-up imaging recommended (ref: J Am Coll Radiol. 2015 Feb;12(2): 143-50). IMPRESSION: 1. No acute intracranial abnormality. 2. Prominence of the lateral ventricles may be related to central predominant atrophy, although a component of normal pressure/communicating hydrocephalus cannot be excluded. 3. No acute displaced fracture or traumatic listhesis of the cervical spine. Electronically Signed   By: Iven Finn M.D.   On: 09/28/2020 17:58   CT ABDOMEN PELVIS W CONTRAST  Result Date: 09/28/2020 CLINICAL DATA:  Abdominal pain, acute, nonlocalized. History of right hemicolectomy in July of 2022 for colon cancer EXAM: CT ABDOMEN AND PELVIS WITH CONTRAST TECHNIQUE: Multidetector CT imaging of the abdomen and pelvis was performed using the standard protocol following bolus administration of intravenous contrast. CONTRAST:  12m OMNIPAQUE IOHEXOL 350 MG/ML SOLN COMPARISON:  06/17/2020. FINDINGS: Lower chest: Moderate bilateral pleural effusions with associated compressive atelectasis of the bilateral lower lobes. Cardiomegaly. Coronary artery atherosclerosis. Hepatobiliary: 1.9 cm indeterminate density lesion within the right hepatic lobe not definitively seen on the previous study (series 2, image 18) numerous hepatic cysts are similar the prior study. Suspected gallbladder sludge. No hyperdense gallstone. No pericholecystic inflammatory changes by CT. No biliary dilatation. Pancreas: Unremarkable. No pancreatic ductal dilatation or surrounding inflammatory changes. Spleen: Normal in size without focal abnormality. Adrenals/Urinary Tract: Unremarkable adrenal glands. Kidneys enhance  symmetrically. No renal stone or hydronephrosis. Mild diffuse urinary bladder wall thickening. Small bladder diverticula. Stomach/Bowel: Small hiatal hernia. Stomach appears otherwise within normal limits. No dilated loops of bowel. Interval postsurgical changes of a right hemicolectomy. Circumferential wall thickening within the low rectum. Scattered colonic diverticulosis. Moderate-large volume of stool throughout the colon. Vascular/Lymphatic: Extensive atherosclerosis throughout the aortoiliac axis. No abdominopelvic lymphadenopathy. Reproductive: Uterus and bilateral adnexa are unremarkable. Other: No free fluid. No abdominopelvic fluid collection. No pneumoperitoneum. No abdominal wall hernia. Musculoskeletal: Prior right hip ORIF. Healed right inferior pubic ramus fracture. There is a subtle superior endplate compression fracture of L2 which is new from the previous CT with less than 10% vertebral body height loss. IMPRESSION: 1. Moderate bilateral pleural effusions with associated compressive atelectasis. 2. Indeterminate 1.9 cm density lesion within the right hepatic lobe not definitively seen on the previous study. Findings are concerning for hepatic metastatic disease given history of colon cancer. Further evaluation with non-emergent MRI of the abdomen is recommended. 3. Circumferential wall thickening within the low rectum may represent proctitis. 4. New subtle superior endplate compression fracture of L2 with less than 10% vertebral body height loss. 5. Moderate-large volume of stool throughout the colon. Interval right hemicolectomy. 6. Mild diffuse urinary bladder wall thickening with small bladder diverticula. Correlate with urinalysis to exclude cystitis. Aortic Atherosclerosis (ICD10-I70.0). Electronically Signed   By: NDavina PokeD.O.   On: 09/28/2020 16:57   DG Chest Port 1 View  Result Date: 09/28/2020 CLINICAL DATA:  Atrial fibrillation. Recently taken off of blood thinners. Back pain  and generalized pain. Tachycardic to 140. EXAM: PORTABLE CHEST 1 VIEW COMPARISON:  Chest  x-ray 09/12/2020, CT chest 03/28/2020 FINDINGS: The heart and mediastinal contours are unchanged. Aortic calcification. No focal consolidation. Increased interstitial markings. Interval increase in size of bilateral small pleural effusions, left greater right. No pneumothorax. No acute osseous abnormality. IMPRESSION: Pulmonary edema with interval increase in size of bilateral small pleural effusions, left greater right. Electronically Signed   By: Iven Finn M.D.   On: 09/28/2020 15:21     Labs:   Basic Metabolic Panel: Recent Labs  Lab 09/28/20 1420 09/28/20 1646 09/29/20 0651 09/29/20 1153 09/30/20 0622 10/01/20 0401 10/03/20 0519 10/04/20 0544  NA 140  --  139 137 138 136 134* 134*  K 3.1*  --  3.5 3.8 3.2* 4.6 4.7 4.7  CL 104  --  100 99 104 105 102 101  CO2 24  --  '27 28 27 25 23 23  '$ GLUCOSE 116*  --  108* 150* 104* 159* 109* 113*  BUN 17  --  '18 17 19 18 '$ 26* 32*  CREATININE 0.78  --  0.89 0.83 0.69 0.72 0.81 0.72  CALCIUM 9.4  --  9.1 9.3 8.8* 8.6* 9.3 9.5  MG 2.2  --  2.2 2.2 2.1  --   --   --   PHOS  --  3.6  --  4.1  --   --   --   --    GFR Estimated Creatinine Clearance: 37.8 mL/min (by C-G formula based on SCr of 0.72 mg/dL). Liver Function Tests: Recent Labs  Lab 09/28/20 1646 09/29/20 1153  AST 19 26  ALT 14 14  ALKPHOS 94 100  BILITOT 0.9 0.8  PROT 7.7 8.3*  ALBUMIN 3.3* 3.5   No results for input(s): LIPASE, AMYLASE in the last 168 hours. No results for input(s): AMMONIA in the last 168 hours. Coagulation profile Recent Labs  Lab 09/28/20 1420  INR 1.1    CBC: Recent Labs  Lab 09/28/20 1420 09/29/20 1153 10/03/20 0519  WBC 14.3* 9.2 8.5  NEUTROABS  --  7.0  --   HGB 15.2* 15.0 14.5  HCT 46.9* 46.7* 45.4  MCV 90.5 90.2 91.0  PLT 366 349 347   Cardiac Enzymes: Recent Labs  Lab 09/28/20 1646  CKTOTAL 22*   BNP: Invalid input(s):  POCBNP CBG: No results for input(s): GLUCAP in the last 168 hours. D-Dimer No results for input(s): DDIMER in the last 72 hours. Hgb A1c No results for input(s): HGBA1C in the last 72 hours. Lipid Profile No results for input(s): CHOL, HDL, LDLCALC, TRIG, CHOLHDL, LDLDIRECT in the last 72 hours. Thyroid function studies No results for input(s): TSH, T4TOTAL, T3FREE, THYROIDAB in the last 72 hours.  Invalid input(s): FREET3 Anemia work up No results for input(s): VITAMINB12, FOLATE, FERRITIN, TIBC, IRON, RETICCTPCT in the last 72 hours. Microbiology Recent Results (from the past 240 hour(s))  Resp Panel by RT-PCR (Flu A&B, Covid) Nasopharyngeal Swab     Status: None   Collection Time: 09/28/20  3:19 PM   Specimen: Nasopharyngeal Swab; Nasopharyngeal(NP) swabs in vial transport medium  Result Value Ref Range Status   SARS Coronavirus 2 by RT PCR NEGATIVE NEGATIVE Final    Comment: (NOTE) SARS-CoV-2 target nucleic acids are NOT DETECTED.  The SARS-CoV-2 RNA is generally detectable in upper respiratory specimens during the acute phase of infection. The lowest concentration of SARS-CoV-2 viral copies this assay can detect is 138 copies/mL. A negative result does not preclude SARS-Cov-2 infection and should not be used as the sole basis for treatment  or other patient management decisions. A negative result may occur with  improper specimen collection/handling, submission of specimen other than nasopharyngeal swab, presence of viral mutation(s) within the areas targeted by this assay, and inadequate number of viral copies(<138 copies/mL). A negative result must be combined with clinical observations, patient history, and epidemiological information. The expected result is Negative.  Fact Sheet for Patients:  EntrepreneurPulse.com.au  Fact Sheet for Healthcare Providers:  IncredibleEmployment.be  This test is no t yet approved or cleared by the  [place] FDA and  has been authorized for detection and/or diagnosis of SARS-CoV-2 by FDA under an Emergency Use Authorization (EUA). This EUA will remain  in effect (meaning this test can be used) for the duration of the COVID-19 declaration under Section 564(b)(1) of the Act, 21 U.S.C.section 360bbb-3(b)(1), unless the authorization is terminated  or revoked sooner.       Influenza A by PCR NEGATIVE NEGATIVE Final   Influenza B by PCR NEGATIVE NEGATIVE Final    Comment: (NOTE) The Xpert Xpress SARS-CoV-2/FLU/RSV plus assay is intended as an aid in the diagnosis of influenza from Nasopharyngeal swab specimens and should not be used as a sole basis for treatment. Nasal washings and aspirates are unacceptable for Xpert Xpress SARS-CoV-2/FLU/RSV testing.  Fact Sheet for Patients: EntrepreneurPulse.com.au  Fact Sheet for Healthcare Providers: IncredibleEmployment.be  This test is not yet approved or cleared by the [place] FDA and has been authorized for detection and/or diagnosis of SARS-CoV-2 by FDA under an Emergency Use Authorization (EUA). This EUA will remain in effect (meaning this test can be used) for the duration of the COVID-19 declaration under Section 564(b)(1) of the Act, 21 U.S.C. section 360bbb-3(b)(1), unless the authorization is terminated or revoked.  Performed at [facility], [address] [place], [place] [address]   Blood culture (routine single)     Status: None   Collection Time: 09/28/20  3:19 PM   Specimen: BLOOD  Result Value Ref Range Status   Specimen Description BLOOD LEFT ANTECUBITAL  Final   Special Requests   Final    BOTTLES DRAWN AEROBIC AND ANAEROBIC Blood Culture results may not be optimal due to an inadequate volume of blood received in culture bottles   Culture   Final    NO GROWTH 5 DAYS Performed at [facility], [address] [place], [place] [address]    Report  Status 10/03/2020 FINAL  Final  Urine Culture     Status: Abnormal   Collection Time: 09/28/20  3:21 PM   Specimen: Urine, Random  Result Value Ref Range Status   Specimen Description   Final    URINE, RANDOM Performed at [facility], [address], [place], [place] [address]    Special Requests   Final    NONE Performed at [facility], [address] [place], [place] [address]    Culture >=100,000 COLONIES/mL PSEUDOMONAS AERUGINOSA (A)  Final   Report Status 10/01/2020 FINAL  Final   Organism ID, Bacteria PSEUDOMONAS AERUGINOSA (A)  Final      Susceptibility   Pseudomonas aeruginosa - MIC*    CEFTAZIDIME 4 SENSITIVE Sensitive     CIPROFLOXACIN <=0.25 SENSITIVE Sensitive     GENTAMICIN <=1 SENSITIVE Sensitive     IMIPENEM 1 SENSITIVE Sensitive     PIP/TAZO 8 SENSITIVE Sensitive     CEFEPIME 2 SENSITIVE Sensitive     * >=100,000 COLONIES/mL PSEUDOMONAS AERUGINOSA  MRSA Next Gen by PCR, Nasal  Status: None   Collection Time: 09/30/20  4:08 AM   Specimen: Nasal Mucosa; Nasal Swab  Result Value Ref Range Status   MRSA by PCR Next Gen NOT DETECTED NOT DETECTED Final    Comment: (NOTE) The GeneXpert MRSA Assay (FDA approved for NASAL specimens only), is one component of a comprehensive MRSA colonization surveillance program. It is not intended to diagnose MRSA infection nor to guide or monitor treatment for MRSA infections. Test performance is not FDA approved in patients less than 50 years old. Performed at [facility], [address] [place], [place] [address]      Discharge Instructions:   Discharge Instructions     Diet - low sodium heart healthy   Complete by: As directed    Discharge wound care:   Complete by: As directed    Keep wound clean and dry. Apply Mepilex border to wound daily   Increase activity slowly   Complete by: As directed       Allergies as of 10/04/2020       Reactions   Metoprolol Swelling   Feet,  ankles and legs    Bactrim [sulfamethoxazole-trimethoprim] Other (See Comments)   Hearing loss   Ciprofloxacin Other (See Comments)   Flu-like symptoms   Pneumococcal Vaccines Other (See Comments)   Flu-like symptoms        Medication List     STOP taking these medications    diltiazem 60 MG tablet Commonly known as: CARDIZEM   polyethylene glycol powder 17 GM/SCOOP powder Commonly known as: GLYCOLAX/MIRALAX Replaced by: polyethylene glycol 17 g packet       TAKE these medications    acetaminophen 500 MG tablet Commonly known as: TYLENOL Take 1 tablet (500 mg total) by mouth every 6 (six) hours as needed for mild pain (or Fever >/= 101).   amiodarone 200 MG tablet Commonly known as: PACERONE Take 1 tablet (200 mg total) by mouth 2 (two) times daily.   bisacodyl 10 MG suppository Commonly known as: DULCOLAX Place 1 suppository (10 mg total) rectally daily as needed for moderate constipation.   calcium carbonate 500 MG chewable tablet Commonly known as: TUMS - dosed in mg elemental calcium Chew 500 mg by mouth daily as needed for indigestion or heartburn.   ciprofloxacin 500 MG tablet Commonly known as: CIPRO Take 1 tablet (500 mg total) by mouth 2 (two) times daily for 4 days.   diltiazem 120 MG 24 hr capsule Commonly known as: CARDIZEM CD Take 1 capsule (120 mg total) by mouth daily.   fluticasone 50 MCG/ACT nasal spray Commonly known as: FLONASE Place 1 spray into both nostrils daily.   furosemide 20 MG tablet Commonly known as: LASIX Take 1 tablet (20 mg total) by mouth daily. What changed:  medication strength how much to take   iron polysaccharides 150 MG capsule Commonly known as: NIFEREX Take 1 capsule (150 mg total) by mouth daily.   lovastatin 20 MG tablet Commonly known as: MEVACOR Take 20 mg by mouth at bedtime.   oxyCODONE 5 MG immediate release tablet Commonly known as: Oxy IR/ROXICODONE Take 1-2 tablets (5-10 mg total) by mouth  every 4 (four) hours as needed for severe pain.   pantoprazole 40 MG tablet Commonly known as: PROTONIX Take 1 tablet (40 mg total) by mouth 2 (two) times daily before a meal. What changed:  when to take this reasons to take this   polyethylene glycol 17 g packet Commonly known as: MIRALAX / GLYCOLAX  Take 17 g by mouth daily. Replaces: polyethylene glycol powder 17 GM/SCOOP powder   Rivaroxaban 15 MG Tabs tablet Commonly known as: XARELTO Take 1 tablet (15 mg total) by mouth daily with supper.   sacubitril-valsartan 24-26 MG Commonly known as: ENTRESTO Take 1 tablet by mouth 2 (two) times daily.   senna-docusate 8.6-50 MG tablet Commonly known as: Senokot-S Take 1 tablet by mouth 2 (two) times daily.   spironolactone 25 MG tablet Commonly known as: ALDACTONE Take 0.5 tablets (12.5 mg total) by mouth daily.   SYSTANE OP Place 1 drop into both eyes in the morning and at bedtime.   traMADol 50 MG tablet Commonly known as: ULTRAM Take 1 tablet (50 mg total) by mouth every 6 (six) hours as needed for moderate pain. What changed: reasons to take this   tretinoin 0.05 % cream Commonly known as: RETIN-A Apply 1 application topically at bedtime.               Discharge Care Instructions  (From admission, onward)           Start     Ordered   10/04/20 0000  Discharge wound care:       Comments: Keep wound clean and dry. Apply Mepilex border to wound daily   10/04/20 1320            Follow-up Information     Corey Skains, MD Follow up in 10 day(s).   Specialty: Cardiology Contact information: [address] [facility] [place] [address] [phone]                  Time coordinating discharge: 32 minutes  Signed:  [facility] 10/04/2020, 1:31 PM   Pager on [email or web address]. If 7PM-7AM, please contact night-coverage at [email or web address]

## 2020-10-04 NOTE — Care Management Important Message (Signed)
Important Message  Patient Details  Name: [name] [name] MRN: [id number] Date of Birth: December 06, 1929   Medicare Important Message Given:  Yes     Juliann Pulse A Jerin Franzel 10/04/2020, 3:42 PM

## 2020-10-04 NOTE — Plan of Care (Signed)
  Problem: Education: Goal: Knowledge of General Education information will improve Description: Including pain rating scale, medication(s)/side effects and non-pharmacologic comfort measures 10/04/2020 1448 by Emmaline Life, RN Outcome: Adequate for Discharge 10/04/2020 1253 by Emmaline Life, RN Outcome: Progressing   Problem: Health Behavior/Discharge Planning: Goal: Ability to manage health-related needs will improve 10/04/2020 1448 by Emmaline Life, RN Outcome: Adequate for Discharge 10/04/2020 1253 by Emmaline Life, RN Outcome: Progressing   Problem: Clinical Measurements: Goal: Ability to maintain clinical measurements within normal limits will improve Outcome: Adequate for Discharge Goal: Will remain free from infection Outcome: Adequate for Discharge Goal: Diagnostic test results will improve Outcome: Adequate for Discharge Goal: Respiratory complications will improve Outcome: Adequate for Discharge Goal: Cardiovascular complication will be avoided Outcome: Adequate for Discharge   Problem: Activity: Goal: Risk for activity intolerance will decrease Outcome: Adequate for Discharge   Problem: Nutrition: Goal: Adequate nutrition will be maintained Outcome: Adequate for Discharge   Problem: Coping: Goal: Level of anxiety will decrease Outcome: Adequate for Discharge   Problem: Elimination: Goal: Will not experience complications related to bowel motility Outcome: Adequate for Discharge Goal: Will not experience complications related to urinary retention Outcome: Adequate for Discharge   Problem: Pain Managment: Goal: General experience of comfort will improve Outcome: Adequate for Discharge   Problem: Safety: Goal: Ability to remain free from injury will improve Outcome: Adequate for Discharge   Problem: Skin Integrity: Goal: Risk for impaired skin integrity will decrease Outcome: Adequate for Discharge   Problem: Acute Rehab PT Goals(only PT  should resolve) Goal: Pt Will Go Supine/Side To Sit Outcome: Adequate for Discharge Goal: Patient Will Transfer Sit To/From Stand Outcome: Adequate for Discharge Goal: Pt Will Transfer Bed To Chair/Chair To Bed Outcome: Adequate for Discharge Goal: Pt Will Ambulate Outcome: Adequate for Discharge Goal: Pt/caregiver will Perform Home Exercise Program Outcome: Adequate for Discharge   Problem: Malnutrition  (NI-5.2) Goal: Food and/or nutrient delivery Description: Individualized approach for food/nutrient provision. Outcome: Adequate for Discharge

## 2020-10-04 NOTE — TOC Transition Note (Signed)
Transition of Care [facility]) - CM/SW Discharge Note   Patient Details  Name: [name] [name] MRN: [id number] Date of Birth: [birth year]  [facility]) CM/SW Contact:  Alberteen Sam, LCSW Phone Number: 10/04/2020, 1:33 PM   Clinical Narrative:     Patient will DC [id number] [facility] Anticipated DC date: 10/04/20 Family notified:son [name] [name] Transport by: [name] [name]  Per MD patient ready for DC to [facility]. RN, patient, patient's family, and facility notified of DC. Discharge Summary sent to facility. RN given number for report  [phone] Room 607B. DC packet on chart. Ambulance transport requested for patient.  CSW signing off.  Pricilla Riffle, LCSW   Final next level of care: Skilled Nursing Facility Barriers to Discharge: No Barriers Identified   Patient Goals and CMS Choice   CMS Medicare.gov Compare Post Acute Care list provided to:: Patient Represenative (must comment) (son [name] [name]) Choice offered to / list presented to : Adult Children  Discharge Placement              Patient chooses bed at: [facility] [place] Patient to be transferred to facility by: [facility] Name of family member notified: [name] [name] Patient and family notified of of transfer: 10/04/20  Discharge Plan and Services     Post Acute Care Choice: [place]                               Social Determinants of Health (SDOH) Interventions     Readmission Risk Interventions Readmission Risk Prevention Plan 09/11/2020  Transportation Screening Complete  PCP or Specialist Appt within 3-5 Days Complete  HRI or [place] Complete  Social Work Consult for [place] Planning/Counseling Complete  Palliative Care Screening Complete  Medication Review Press photographer) Complete  Some recent data might be hidden

## 2020-10-04 NOTE — Plan of Care (Signed)
  Problem: Education: Goal: Knowledge of General Education information will improve Description Including pain rating scale, medication(s)/side effects and non-pharmacologic comfort measures Outcome: Progressing   Problem: Health Behavior/Discharge Planning: Goal: Ability to manage health-related needs will improve Outcome: Progressing   

## 2020-10-04 NOTE — Progress Notes (Signed)
Patient discharge to [facility], left floor via stretcher transported by [facility]. Family aware of discharge plan, discharge packet sent with EMS staff. Report call to Nurse at [facility].

## 2020-10-06 NOTE — Progress Notes (Signed)
Patient ID: [name] [name], female    DOB: May 20, 1929, 87 y.o.   MRN: [id number]  HPI  [name] [name] is a 87 y/o female with a history of paroxsymal atrial fibrillation, hyperlipidemia, HTN, anemia, anxiety, colon cancer, GERD, bell's palsy, previous tobacco use and chronic heart failure.   Echo report from 08/08/20 reviewed and showed an EF of 30-35% along with severe LAE and moderate MR.   Admitted 09/28/20 due to palpitations, malaise. generalized weakness and back pain. Found to be in AF RVR (new onset). Initially given amiodarone gtt and cardizem infusion. Cardiology and oncology consults obtained. PT/ OT evaluation done. Back brace given for acute L2 fracture. New hepatic lesion noted (history of colon cancer). Discharged after 6 days. Admitted 09/09/20 due to weakness, dyspnea, orthopnea, PND for a week. Initially given IV lasix with transition to oral diuretics. Cardiology consult obtained. Had thoracentesis of both sides. Initially needed 4L of oxygen but weaned down but unable to wean to room air. Treated with antibiotics for UTI. Discharged after 3 days.   [name] [name] presents today for her initial visit with a chief complaint of minimal shortness of breath upon moderate exertion. [name] [name] has experienced this for several months but has improved since her recent admission. [name] [name] has associated fatigue, severe hearing loss, weakness and back pain along with this. [name] [name] denies any difficulty sleeping, abdominal distention, palpitations, pedal edema, chest pain, cough, dizziness or weight gain.   Due to severe hearing loss and inactive hearing aids, son assisted with information. Son and patient both say that they used to eat out nightly because they don't cook. [name] [name] says that [name] [name] doesn't like the food at [facility] and her son says he could bring her in food but that it would probably be salty since he would be picking it up from a restaurant.   Past Medical History:  Diagnosis Date   Anemia    Anxiety     Arrhythmia    atrial fibrillation   Cancer (HCC)    Squamous Cell Carcinoma (top of head)   CHF (congestive heart failure) (HCC)    Colon cancer (HCC)    Constipation, chronic    Deaf, left    Diverticular disease    GERD (gastroesophageal reflux disease)    Hearing aid worn    Hearing deficit    History of Bell's palsy    left side - resolved - 50 yrs ago   Hypertension    Internal hemorrhoids    Iron deficiency anemia    Mitral regurgitation    moderate to severe on 03/27/20 echo   Osteopenia    Pure hypercholesterolemia    Past Surgical History:  Procedure Laterality Date   CATARACT EXTRACTION W/PHACO Left 11/16/2015   Procedure: CATARACT EXTRACTION PHACO AND INTRAOCULAR LENS PLACEMENT ([place]);  Surgeon: Leandrew Koyanagi, MD;  Location: [place];  Service: Ophthalmology;  Laterality: Left;  TORIC   CATARACT EXTRACTION W/PHACO Right 12/07/2015   Procedure: CATARACT EXTRACTION PHACO AND INTRAOCULAR LENS PLACEMENT ([facility]);  Surgeon: Leandrew Koyanagi, MD;  Location: [place];  Service: Ophthalmology;  Laterality: Right;  TORIC   COLONOSCOPY WITH PROPOFOL N/A 08/10/2014   Procedure: COLONOSCOPY WITH PROPOFOL;  Surgeon: Lollie Sails, MD;  Location: [facility] ENDOSCOPY;  Service: Endoscopy;  Laterality: N/A;   COLONOSCOPY WITH PROPOFOL N/A 05/12/2020   Procedure: COLONOSCOPY WITH PROPOFOL;  Surgeon: Lin Landsman, MD;  Location: [facility] ENDOSCOPY;  Service: Gastroenterology;  Laterality: N/A;   ESOPHAGOGASTRODUODENOSCOPY (EGD) WITH PROPOFOL N/A 03/28/2020  Procedure: ESOPHAGOGASTRODUODENOSCOPY (EGD) WITH PROPOFOL;  Surgeon: Lucilla Lame, MD;  Location: [facility] ENDOSCOPY;  Service: Endoscopy;  Laterality: N/A;   EYE SURGERY     FEMUR FRACTURE SURGERY     FRACTURE SURGERY     GIVENS CAPSULE STUDY N/A 04/19/2020   Procedure: GIVENS CAPSULE STUDY;  Surgeon: Lin Landsman, MD;  Location: [facility] ENDOSCOPY;  Service: Gastroenterology;  Laterality: N/A;    LAPAROSCOPIC RIGHT HEMI COLECTOMY Right 08/04/2020   Procedure: LAPAROSCOPIC RIGHT HEMI COLECTOMY;  Surgeon: [name], MD;  Location: [facility];  Service: General;  Laterality: Right;   Nasal Polyp Removal     Family History  Problem Relation Age of Onset   Colon cancer Mother    Social History   Tobacco Use   Smoking status: Former    Types: Cigarettes    Quit date: 10/30/1985    Years since quitting: 34.9   Smokeless tobacco: Never  Substance Use Topics   Alcohol use: No   Allergies  Allergen Reactions   Metoprolol Swelling    Feet, ankles and legs    Bactrim [Sulfamethoxazole-Trimethoprim] Other (See Comments)    Hearing loss   Ciprofloxacin Other (See Comments)    Flu-like symptoms   Pneumococcal Vaccines Other (See Comments)    Flu-like symptoms   Prior to Admission medications   Medication Sig Start Date End Date Taking? Authorizing Provider  acetaminophen (TYLENOL) 500 MG tablet Take 1 tablet (500 mg total) by mouth every 6 (six) hours as needed for mild pain (or Fever >/= 101). 10/03/20  Yes Cherene Altes, MD  amiodarone (PACERONE) 200 MG tablet Take 1 tablet (200 mg total) by mouth 2 (two) times daily. 10/03/20  Yes Cherene Altes, MD  bisacodyl (DULCOLAX) 10 MG suppository Place 1 suppository (10 mg total) rectally daily as needed for moderate constipation. 10/03/20  Yes Cherene Altes, MD  calcium carbonate (TUMS - DOSED IN MG ELEMENTAL CALCIUM) 500 MG chewable tablet Chew 500 mg by mouth daily as needed for indigestion or heartburn.   Yes [provider]  ciprofloxacin (CIPRO) 500 MG tablet Take 1 tablet (500 mg total) by mouth 2 (two) times daily for 4 days. 10/04/20 10/08/20 Yes Cherene Altes, MD  diltiazem (CARDIZEM CD) 120 MG 24 hr capsule Take 1 capsule (120 mg total) by mouth daily. 10/04/20  Yes Cherene Altes, MD  fluticasone (FLONASE) 50 MCG/ACT nasal spray Place 1 spray into both nostrils daily.   Yes [provider]   furosemide (LASIX) 20 MG tablet Take 1 tablet (20 mg total) by mouth daily. 10/03/20  Yes Cherene Altes, MD  iron polysaccharides (NIFEREX) 150 MG capsule Take 1 capsule (150 mg total) by mouth daily. 08/14/20  Yes Mercy Riding, MD  lovastatin (MEVACOR) 20 MG tablet Take 20 mg by mouth at bedtime. 09/16/20  Yes [provider]  oxyCODONE (OXY IR/ROXICODONE) 5 MG immediate release tablet Take 1-2 tablets (5-10 mg total) by mouth every 4 (four) hours as needed for severe pain. 10/03/20  Yes Cherene Altes, MD  Polyethyl Glycol-Propyl Glycol (SYSTANE OP) Place 1 drop into both eyes in the morning and at bedtime.   Yes [provider]  polyethylene glycol (MIRALAX / GLYCOLAX) 17 g packet Take 17 g by mouth daily. 10/04/20  Yes Cherene Altes, MD  Rivaroxaban (XARELTO) 15 MG TABS tablet Take 1 tablet (15 mg total) by mouth daily with supper. 10/04/20  Yes Cherene Altes, MD  sacubitril-valsartan Delene Loll)  24-26 MG Take 1 tablet by mouth 2 (two) times daily. 10/03/20  Yes Cherene Altes, MD  senna-docusate (SENOKOT-S) 8.6-50 MG tablet Take 1 tablet by mouth 2 (two) times daily. 10/03/20  Yes Cherene Altes, MD  spironolactone (ALDACTONE) 25 MG tablet Take 0.5 tablets (12.5 mg total) by mouth daily. 10/04/20  Yes Cherene Altes, MD  traMADol (ULTRAM) 50 MG tablet Take 1 tablet (50 mg total) by mouth every 6 (six) hours as needed for moderate pain. 10/03/20  Yes Cherene Altes, MD  tretinoin (RETIN-A) 0.05 % cream Apply 1 application topically at bedtime. 07/19/20  Yes [provider]  pantoprazole (PROTONIX) 40 MG tablet Take 1 tablet (40 mg total) by mouth 2 (two) times daily before a meal. Patient taking differently: Take 40 mg by mouth 2 (two) times daily as needed (acid reflux). 04/02/20 09/28/20  Enzo Bi, MD   Review of Systems  Constitutional:  Positive for fatigue. Negative for appetite change.  HENT:  Positive for hearing loss. Negative for congestion and  sore throat.   Eyes: Negative.   Respiratory:  Positive for shortness of breath. Negative for cough and chest tightness.   Cardiovascular:  Negative for chest pain, palpitations and leg swelling.  Gastrointestinal:  Negative for abdominal distention and abdominal pain.  Endocrine: Negative.   Genitourinary: Negative.   Musculoskeletal:  Positive for back pain. Negative for neck pain.  Skin: Negative.   Allergic/Immunologic: Negative.   Neurological:  Positive for weakness. Negative for dizziness and light-headedness.  Hematological:  Negative for adenopathy. Does not bruise/bleed easily.  Psychiatric/Behavioral:  Negative for dysphoric mood and sleep disturbance. The patient is not nervous/anxious.    Vitals:   10/07/20 1402  BP: 119/62  Pulse: 76  Resp: 16  SpO2: 98%  Weight: 106 lb 6 oz (48.3 kg)  Height: '5\' 3"'$  (1.6 m)   Wt Readings from Last 3 Encounters:  10/07/20 106 lb 6 oz (48.3 kg)  10/03/20 110 lb 10.7 oz (50.2 kg)  09/12/20 102 lb 15.3 oz (46.7 kg)   Lab Results  Component Value Date   CREATININE 0.72 10/04/2020   CREATININE 0.81 10/03/2020   CREATININE 0.72 10/01/2020    Physical Exam Vitals and nursing note reviewed. Exam conducted with a chaperone present (son).  Constitutional:      Appearance: Normal appearance.  HENT:     Head: Normocephalic and atraumatic.     Right Ear: Decreased hearing noted.     Left Ear: Decreased hearing noted.  Cardiovascular:     Rate and Rhythm: Normal rate and regular rhythm.  Pulmonary:     Effort: Pulmonary effort is normal. No respiratory distress.     Breath sounds: No wheezing or rales.  Abdominal:     General: There is no distension.     Palpations: Abdomen is soft.  Musculoskeletal:        General: No tenderness.     Cervical back: Normal range of motion and neck supple.     Right lower leg: No edema.     Left lower leg: No edema.  Skin:    General: Skin is warm and dry.  Neurological:     General: No focal  deficit present.     Mental Status: [name] [name] is alert and oriented to person, place, and time.  Psychiatric:        Mood and Affect: Mood normal.        Behavior: Behavior normal.  Thought Content: Thought content normal.   Assessment & Plan:  1: Chronic heart failure with reduced ejection fraction- - NYHA class II - euvolemic today - son thinks [name] [name]'s getting weighed daily although patient isn't sure; instructed to call for an overnight weight gain of > 2 pounds or a weekly weight gain of > 5 pounds - currently not adding "much" salt to her food and [name] [name] was instructed to not add any salt and allow her taste buds to get used to the taste of food - son says that they would go out to eat every night because they didn't cook - on GDMT of entresto and spironolatone - metoprolol caused swelling into her legs; could consider trying carvedilol - consider SLGT2 - BNP 09/28/20 was 1687.6   2: HTN- - BP looks good today (119/62) - saw PCP Venetia Maxon) 09/16/20 - BMP 10/04/20 reviewed and showed sodium 134, potassium 4.7, creatinine 0.72 and GFR >60  3: Paroxsymal atrial fibrillation- - saw cardiology Nehemiah Massed) 02/04/20 - on amiodarone, diltazem and rivaroxaban - will message cardiology about stopping diltiazem in a patient that has reduced EF  4: Colon cancer with new liver lesion- - saw oncology Janese Banks) 08/23/20   [facility] did not send facility med list so will not make adjustments at this time.   Return in 6 weeks or sooner for any questions/problems before then.

## 2020-10-07 ENCOUNTER — Encounter: Payer: Self-pay | Admitting: Family

## 2020-10-07 ENCOUNTER — Ambulatory Visit: Payer: Medicare Other | Attending: Family | Admitting: Family

## 2020-10-07 VITALS — BP 119/62 | HR 76 | Resp 16 | Ht 63.0 in | Wt 106.4 lb

## 2020-10-07 DIAGNOSIS — I10 Essential (primary) hypertension: Secondary | ICD-10-CM | POA: Diagnosis not present

## 2020-10-07 DIAGNOSIS — I480 Paroxysmal atrial fibrillation: Secondary | ICD-10-CM | POA: Diagnosis not present

## 2020-10-07 DIAGNOSIS — I5022 Chronic systolic (congestive) heart failure: Secondary | ICD-10-CM

## 2020-10-07 DIAGNOSIS — E7800 Pure hypercholesterolemia, unspecified: Secondary | ICD-10-CM | POA: Diagnosis not present

## 2020-10-07 DIAGNOSIS — Z881 Allergy status to other antibiotic agents status: Secondary | ICD-10-CM | POA: Insufficient documentation

## 2020-10-07 DIAGNOSIS — I110 Hypertensive heart disease with heart failure: Secondary | ICD-10-CM | POA: Diagnosis present

## 2020-10-07 DIAGNOSIS — Z79899 Other long term (current) drug therapy: Secondary | ICD-10-CM | POA: Insufficient documentation

## 2020-10-07 DIAGNOSIS — Z87891 Personal history of nicotine dependence: Secondary | ICD-10-CM | POA: Diagnosis not present

## 2020-10-07 DIAGNOSIS — C189 Malignant neoplasm of colon, unspecified: Secondary | ICD-10-CM | POA: Diagnosis not present

## 2020-10-07 DIAGNOSIS — Z888 Allergy status to other drugs, medicaments and biological substances status: Secondary | ICD-10-CM | POA: Diagnosis not present

## 2020-10-07 DIAGNOSIS — K219 Gastro-esophageal reflux disease without esophagitis: Secondary | ICD-10-CM | POA: Insufficient documentation

## 2020-10-07 DIAGNOSIS — Z887 Allergy status to serum and vaccine status: Secondary | ICD-10-CM | POA: Insufficient documentation

## 2020-10-07 NOTE — Patient Instructions (Signed)
Continue weighing daily and call for an overnight weight gain of > 2 pounds or a weekly weight gain of >5 pounds. 

## 2020-10-13 ENCOUNTER — Encounter: Payer: Self-pay | Admitting: Oncology

## 2020-11-15 ENCOUNTER — Other Ambulatory Visit: Payer: Self-pay

## 2020-11-15 ENCOUNTER — Ambulatory Visit: Payer: Medicare Other | Attending: Family | Admitting: Family

## 2020-11-15 ENCOUNTER — Encounter: Payer: Self-pay | Admitting: Family

## 2020-11-15 VITALS — BP 136/68 | HR 67 | Resp 18 | Ht 62.0 in | Wt 103.0 lb

## 2020-11-15 DIAGNOSIS — I480 Paroxysmal atrial fibrillation: Secondary | ICD-10-CM | POA: Insufficient documentation

## 2020-11-15 DIAGNOSIS — E7800 Pure hypercholesterolemia, unspecified: Secondary | ICD-10-CM | POA: Insufficient documentation

## 2020-11-15 DIAGNOSIS — R531 Weakness: Secondary | ICD-10-CM | POA: Insufficient documentation

## 2020-11-15 DIAGNOSIS — C189 Malignant neoplasm of colon, unspecified: Secondary | ICD-10-CM | POA: Insufficient documentation

## 2020-11-15 DIAGNOSIS — I5022 Chronic systolic (congestive) heart failure: Secondary | ICD-10-CM | POA: Insufficient documentation

## 2020-11-15 DIAGNOSIS — R0602 Shortness of breath: Secondary | ICD-10-CM | POA: Insufficient documentation

## 2020-11-15 DIAGNOSIS — G8929 Other chronic pain: Secondary | ICD-10-CM | POA: Insufficient documentation

## 2020-11-15 DIAGNOSIS — I110 Hypertensive heart disease with heart failure: Secondary | ICD-10-CM | POA: Insufficient documentation

## 2020-11-15 DIAGNOSIS — I10 Essential (primary) hypertension: Secondary | ICD-10-CM | POA: Diagnosis not present

## 2020-11-15 DIAGNOSIS — R5383 Other fatigue: Secondary | ICD-10-CM | POA: Diagnosis present

## 2020-11-15 DIAGNOSIS — Z09 Encounter for follow-up examination after completed treatment for conditions other than malignant neoplasm: Secondary | ICD-10-CM | POA: Diagnosis not present

## 2020-11-15 DIAGNOSIS — M549 Dorsalgia, unspecified: Secondary | ICD-10-CM | POA: Diagnosis not present

## 2020-11-15 DIAGNOSIS — Z79899 Other long term (current) drug therapy: Secondary | ICD-10-CM | POA: Insufficient documentation

## 2020-11-15 DIAGNOSIS — K219 Gastro-esophageal reflux disease without esophagitis: Secondary | ICD-10-CM | POA: Diagnosis not present

## 2020-11-15 DIAGNOSIS — Z87891 Personal history of nicotine dependence: Secondary | ICD-10-CM | POA: Insufficient documentation

## 2020-11-15 NOTE — Patient Instructions (Addendum)
Continue weighing daily and call for an overnight weight gain of > 2 pounds or a weekly weight gain of >5 pounds.     Palliative care will call you to set up a visit

## 2020-11-15 NOTE — Progress Notes (Signed)
Patient ID: [name] [name], female    DOB: 09-23-1929, 87 y.o.   MRN: [id number]  HPI  [name] is a 87 y/o female with a history of paroxsymal atrial fibrillation, hyperlipidemia, HTN, anemia, anxiety, colon cancer, GERD, bell's palsy, previous tobacco use and chronic heart failure.   Echo report from 08/08/20 reviewed and showed an EF of 30-35% along with severe LAE and moderate MR.   Admitted 09/28/20 due to palpitations, malaise. generalized weakness and back pain. Found to be in AF RVR (new onset). Initially given amiodarone gtt and cardizem infusion. Cardiology and oncology consults obtained. PT/ OT evaluation done. Back brace given for acute L2 fracture. New hepatic lesion noted (history of colon cancer). Discharged after 6 days. Admitted 09/09/20 due to weakness, dyspnea, orthopnea, PND for a week. Initially given IV lasix with transition to oral diuretics. Cardiology consult obtained. Had thoracentesis of both sides. Initially needed 4L of oxygen but weaned down but unable to wean to room air. Treated with antibiotics for UTI. Discharged after 3 days.   She presents today for a follow-upl visit with a chief complaint of minimal fatigue upon moderate exertion. She says this has been present for "quite awhile". She has associated shortness of breath, hearing loss, weakness & chronic back pain along with this. She denies any difficulty sleeping, abdominal distention, palpitations, pedal edema, chest pain, cough, dizziness or weight gain.   Difficult in communicating with patient with her extensive hearing loss without hearing aids. Son and son's girlfriend state the patient was discharged from [facility] today and they are actually on the way home. None of them have any idea what medications she is taking and they left everything in the car.   Son is very drowsy in the room and has difficulty in staying awake for any conversation. Girlfriend has to repeatedly wake him asking if he's ok  but son says that he's fine, just sleepy.   When asking patient questions, she said numerous times, "I just want to die, I've lived a long life but I'm ready to just die. I don't ever want to go back to [facility]".    Past Medical History:  Diagnosis Date   Anemia    Anxiety    Arrhythmia    atrial fibrillation   Cancer (HCC)    Squamous Cell Carcinoma (top of head)   CHF (congestive heart failure) (HCC)    Colon cancer (HCC)    Constipation, chronic    Deaf, left    Diverticular disease    GERD (gastroesophageal reflux disease)    Hearing aid worn    Hearing deficit    History of Bell's palsy    left side - resolved - 50 yrs ago   Hypertension    Internal hemorrhoids    Iron deficiency anemia    Mitral regurgitation    moderate to severe on 03/27/20 echo   Osteopenia    Pure hypercholesterolemia    Past Surgical History:  Procedure Laterality Date   CATARACT EXTRACTION W/PHACO Left 11/16/2015   Procedure: CATARACT EXTRACTION PHACO AND INTRAOCULAR LENS PLACEMENT ([place]);  Surgeon: Leandrew Koyanagi, MD;  Location: [place];  Service: Ophthalmology;  Laterality: Left;  TORIC   CATARACT EXTRACTION W/PHACO Right 12/07/2015   Procedure: CATARACT EXTRACTION PHACO AND INTRAOCULAR LENS PLACEMENT ([facility]);  Surgeon: Leandrew Koyanagi, MD;  Location: [place];  Service: Ophthalmology;  Laterality: Right;  TORIC   COLONOSCOPY WITH PROPOFOL N/A 08/10/2014   Procedure: COLONOSCOPY WITH PROPOFOL;  Surgeon: Lollie Sails, MD;  Location: [facility] ENDOSCOPY;  Service: Endoscopy;  Laterality: N/A;   COLONOSCOPY WITH PROPOFOL N/A 05/12/2020   Procedure: COLONOSCOPY WITH PROPOFOL;  Surgeon: Lin Landsman, MD;  Location: [facility] ENDOSCOPY;  Service: Gastroenterology;  Laterality: N/A;   ESOPHAGOGASTRODUODENOSCOPY (EGD) WITH PROPOFOL N/A 03/28/2020   Procedure: ESOPHAGOGASTRODUODENOSCOPY (EGD) WITH PROPOFOL;  Surgeon: Lucilla Lame, MD;  Location[facility] END[facility]COPY;  Service:  Endoscopy;  Laterality: N/A;   EYE SURGERY     FEMUR FRACTURE SURGERY     FRACTURE SURGERY     GIVENS CAPSULE STUDY N/A 04/19/2020   Procedure: GIVENS CAPSULE STUDY;  Surgeon: Lin Landsman, MD;  Location: [facility] ENDOSCOPY;  Service: Gastroenterology;  Laterality: N/A;   LAPAROSCOPIC RIGHT HEMI COLECTOMY Right 08/04/2020   Procedure: LAPAROSCOPIC RIGHT HEMI COLECTOMY;  Surgeon: [name], MD;  Location: [facility];  Service: General;  Laterality: Right;   Nasal Polyp Removal     Family History  Problem Relation Age of Onset   Colon cancer Mother    Social History   Tobacco Use   Smoking status: Former    Types: Cigarettes    Quit date: 10/30/1985    Years since quitting: 35.0   Smokeless tobacco: Never  Substance Use Topics   Alcohol use: No   Allergies  Allergen Reactions   Metoprolol Swelling    Feet, ankles and legs    Bactrim [Sulfamethoxazole-Trimethoprim] Other (See Comments)    Hearing loss   Ciprofloxacin Other (See Comments)    Flu-like symptoms   Pneumococcal Vaccines Other (See Comments)    Flu-like symptoms   Prior to Admission medications   Medication Sig Start Date End Date Taking? Authorizing Provider  acetaminophen (TYLENOL) 500 MG tablet Take 1 tablet (500 mg total) by mouth every 6 (six) hours as needed for mild pain (or Fever >/= 101). 10/03/20  Yes Cherene Altes, MD  bisacodyl (DULCOLAX) 10 MG suppository Place 1 suppository (10 mg total) rectally daily as needed for moderate constipation. 10/03/20  Yes Cherene Altes, MD  calcium carbonate (TUMS - DOSED IN MG ELEMENTAL CALCIUM) 500 MG chewable tablet Chew 500 mg by mouth daily as needed for indigestion or heartburn.   Yes [provider]  fluticasone (FLONASE) 50 MCG/ACT nasal spray Place 1 spray into both nostrils daily.   Yes [provider]  furosemide (LASIX) 20 MG tablet Take 1 tablet (20 mg total) by mouth daily. 10/03/20  Yes Cherene Altes, MD  iron  polysaccharides (NIFEREX) 150 MG capsule Take 1 capsule (150 mg total) by mouth daily. 08/14/20  Yes Mercy Riding, MD  lovastatin (MEVACOR) 20 MG tablet Take 20 mg by mouth at bedtime. 09/16/20  Yes [provider]  oxyCODONE (OXY IR/ROXICODONE) 5 MG immediate release tablet Take 1-2 tablets (5-10 mg total) by mouth every 4 (four) hours as needed for severe pain. 10/03/20  Yes Cherene Altes, MD  pantoprazole (PROTONIX) 40 MG tablet Take 1 tablet (40 mg total) by mouth 2 (two) times daily before a meal. Patient taking differently: Take 40 mg by mouth 2 (two) times daily as needed (acid reflux). 04/02/20 11/15/20 Yes Enzo Bi, MD  Polyethyl Glycol-Propyl Glycol (SYSTANE OP) Place 1 drop into both eyes in the morning and at bedtime.   Yes [provider]  polyethylene glycol (MIRALAX / GLYCOLAX) 17 g packet Take 17 g by mouth daily. 10/04/20  Yes Cherene Altes, MD  sacubitril-valsartan (ENTRESTO) 24-26 MG Take 1 tablet by  mouth 2 (two) times daily. 10/03/20  Yes Cherene Altes, MD  senna-docusate (SENOKOT-S) 8.6-50 MG tablet Take 1 tablet by mouth 2 (two) times daily. 10/03/20  Yes Cherene Altes, MD  traMADol (ULTRAM) 50 MG tablet Take 1 tablet (50 mg total) by mouth every 6 (six) hours as needed for moderate pain. 10/03/20  Yes Cherene Altes, MD  tretinoin (RETIN-A) 0.05 % cream Apply 1 application topically at bedtime. 07/19/20  Yes [provider]  amiodarone (PACERONE) 200 MG tablet Take 1 tablet (200 mg total) by mouth 2 (two) times daily. 10/03/20   Cherene Altes, MD  diltiazem (CARDIZEM CD) 120 MG 24 hr capsule Take 1 capsule (120 mg total) by mouth daily. 10/04/20   Cherene Altes, MD  Rivaroxaban (XARELTO) 15 MG TABS tablet Take 1 tablet (15 mg total) by mouth daily with supper. Patient not taking: Reported on 11/15/2020 10/04/20   Cherene Altes, MD  spironolactone (ALDACTONE) 25 MG tablet Take 0.5 tablets (12.5 mg total) by mouth daily. Patient not  taking: Reported on 11/15/2020 10/04/20   Cherene Altes, MD    Review of Systems  Constitutional:  Positive for fatigue. Negative for appetite change.  HENT:  Positive for hearing loss. Negative for congestion and sore throat.   Eyes: Negative.   Respiratory:  Positive for shortness of breath. Negative for cough and chest tightness.   Cardiovascular:  Negative for chest pain, palpitations and leg swelling.  Gastrointestinal:  Negative for abdominal distention and abdominal pain.  Endocrine: Negative.   Genitourinary: Negative.   Musculoskeletal:  Positive for back pain. Negative for neck pain.  Skin: Negative.   Allergic/Immunologic: Negative.   Neurological:  Positive for weakness. Negative for dizziness and light-headedness.  Hematological:  Negative for adenopathy. Does not bruise/bleed easily.  Psychiatric/Behavioral:  Negative for dysphoric mood and sleep disturbance. The patient is not nervous/anxious.    Vitals:   11/15/20 1310  BP: 136/68  Pulse: 67  Resp: 18  SpO2: 97%  Weight: 103 lb (46.7 kg)  Height: 5\' 2"  (1.575 m)   Wt Readings from Last 3 Encounters:  11/15/20 103 lb (46.7 kg)  10/07/20 106 lb 6 oz (48.3 kg)  10/03/20 110 lb 10.7 oz (50.2 kg)   Lab Results  Component Value Date   CREATININE 0.72 10/04/2020   CREATININE 0.81 10/03/2020   CREATININE 0.72 10/01/2020   Physical Exam Vitals and nursing note reviewed. Exam conducted with a chaperone present (son).  Constitutional:      Appearance: Normal appearance.  HENT:     Head: Normocephalic and atraumatic.     Right Ear: Decreased hearing noted.     Left Ear: Decreased hearing noted.  Cardiovascular:     Rate and Rhythm: Normal rate and regular rhythm.  Pulmonary:     Effort: Pulmonary effort is normal. No respiratory distress.     Breath sounds: No wheezing or rales.  Abdominal:     General: There is no distension.     Palpations: Abdomen is soft.  Musculoskeletal:        General: No  tenderness.     Cervical back: Normal range of motion and neck supple.     Right lower leg: No edema.     Left lower leg: No edema.  Skin:    General: Skin is warm and dry.  Neurological:     General: No focal deficit present.     Mental Status: She is alert and oriented to person,  place, and time.  Psychiatric:        Mood and Affect: Mood normal.        Behavior: Behavior normal.        Thought Content: Thought content normal.   Assessment & Plan:  1: Chronic heart failure with reduced ejection fraction- - NYHA class II - euvolemic today - son says patient has scales at home; instructed to call for an overnight weight gain of > 2 pounds or a weekly weight gain of > 5 pounds - weight down 3 pounds from last visit here 6 weeks ago - currently not adding "much" salt to her food and she was instructed to not add any salt  - son says that they would go out to eat every night because they didn't cook; cautioned about watching sodium content carefully and that they can't go out to eat for every meal or she could end back up in the hospital - patient was just released from [facility] and left medications in the car; neither patient, son or son's girlfriend have any idea of what medication the patient is taking - palliative care referral made to address [place]; may need social work consult to also help - BNP 09/28/20 was 1687.6   2: HTN- - BP looks good (136/68) - saw PCP Venetia Maxon) 09/16/20 - BMP 10/04/20 reviewed and showed sodium 134, potassium 4.7, creatinine 0.72 and GFR >60  3: Paroxsymal atrial fibrillation- - saw cardiology Nehemiah Massed) 02/04/20 - no idea what medications she is currently taking  4: Colon cancer with new liver lesion- - saw oncology Janese Banks) 08/23/20   Patient did not bring her medications nor a list and has no idea of what she is taking. Son nor son's girlfriend know the medications either. Emphasized the importance of bringing medication bottles to every visit.    Return in 1 month or sooner for any questions/problems before then.

## 2020-11-16 ENCOUNTER — Telehealth: Payer: Self-pay | Admitting: Student

## 2020-11-16 NOTE — Telephone Encounter (Signed)
Spoke with patient's son Percell Miller, regarding the Palliative referral/services and after discussing Palliative services with him he was in agreement with scheduling visit.  I have scheduled an In-home Consult for 11/22/20 @ 12:30 PM

## 2020-11-20 ENCOUNTER — Encounter: Payer: Self-pay | Admitting: Oncology

## 2020-11-22 ENCOUNTER — Other Ambulatory Visit: Payer: Medicare Other | Admitting: Student

## 2020-11-22 ENCOUNTER — Other Ambulatory Visit: Payer: Self-pay

## 2020-11-22 DIAGNOSIS — Z515 Encounter for palliative care: Secondary | ICD-10-CM

## 2020-11-22 DIAGNOSIS — R531 Weakness: Secondary | ICD-10-CM

## 2020-11-22 DIAGNOSIS — C182 Malignant neoplasm of ascending colon: Secondary | ICD-10-CM

## 2020-11-22 DIAGNOSIS — S32020D Wedge compression fracture of second lumbar vertebra, subsequent encounter for fracture with routine healing: Secondary | ICD-10-CM

## 2020-11-22 DIAGNOSIS — I5022 Chronic systolic (congestive) heart failure: Secondary | ICD-10-CM

## 2020-11-22 DIAGNOSIS — F0390 Unspecified dementia without behavioral disturbance: Secondary | ICD-10-CM

## 2020-11-22 DIAGNOSIS — I480 Paroxysmal atrial fibrillation: Secondary | ICD-10-CM

## 2020-11-22 NOTE — Progress Notes (Signed)
[facility] Palliative Care Consult Note Telephone: [phone]  Fax: [phone]   Date of encounter: 11/22/20 12:39 PM PATIENT NAME: [name] [name] [address] [address]   [id number] (home)  DOB: July 23, 1929 MRN: [id number] PRIMARY CARE PROVIDER:    [name], MD,  [place] [address] [phone]  REFERRING PROVIDER:   [name], MD [facility] [place],  [place] [address] [phone]  RESPONSIBLE PARTY:    Contact Information     Name Relation Home Work [address]   [name], [name] [phone]  [phone]        I met face to face with patient and family in the home. Palliative Care was asked to follow this patient by consultation request of  [name], MD to address advance care planning and complex medical decision making. This is the initial visit.                                     ASSESSMENT AND PLAN / RECOMMENDATIONS:   Advance Care Planning/Goals of Care: Goals include to maximize quality of life and symptom management. Patient/health care surrogate gave his/her permission to discuss.Our advance care planning conversation included a discussion about:    The value and importance of advance care planning  Experiences with loved ones who have been seriously ill or have died  Exploration of personal, cultural or spiritual beliefs that might influence medical decisions  Exploration of goals of care in the event of a sudden injury or illness  Son [name] [name] has HCPOA MOST form reviewed today; will complete on next visit. CODE STATUS: Full Code  Education education provided on palliative medicine versus hospice services.  Patient is currently receiving occupational and physical therapy.  Goal is to remain in the home.  Much discussion today on CODE STATUS.  Patient expresses no life-sustaining measures although  son expresses wanting to at least try CPR.  Family is to continue discussion and will complete MOST form on next visit.  Patient also expresses not wanting to receive any chemotherapy.   Symptom Management/Plan:  Chronic systolic heart Failure-EF 30-35%. Continue furosemide, aldactone, Entresto as directed. Continue daily weights; notify HF clinic if weight gain of 2 pounds over night, or a weekly weight gain of > 5 pounds.   Paroxysmal Atrial fibrillation-continue Xarelto, Cardizem as directed. Patient to follow up with Dr. Nehemiah Massed on if she should continue amiodarone.   Malignant neoplasm of colon-s/p colectomy July 2022.  New hepatic lesion. Patient has upcoming appointment with oncology. She states she will not take chemotherapy.   Acute L2 compression fracture-continue PT/OT as directed. Patient encouraged to wear brace as directed. Pain currently managed.   Dementia-family to continue to provide supportive care. Assist with adl's. Reorient/redirect as needed. Monitor for falls/safety.  Generalized weakness-secondary to HF, L2 compression fracture. Continue PT/OT as directed. Encourage walker for ambulation, brace as directed.   Follow up Palliative Care Visit: Palliative care will continue to follow for complex medical decision making, advance care planning, and clarification of goals. Return in 4-6 weeks or prn.  I spent 60 minutes providing this consultation. More than 50% of the time in this consultation was spent in counseling and care coordination.    PPS: 40%  HOSPICE ELIGIBILITY/DIAGNOSIS: TBD  Chief Complaint: Palliative Medicine initial visit.   HISTORY OF PRESENT ILLNESS:  [name]  AASHA [name] is a 87 y.o. year old female  with chronic systolic heart failure, EF 30 to 35%, dementia, hypertension, A. fib with RVR, acute L2 compression fracture, history of colon cancer, colectomy July 2022, new hepatic lesion.  Currently residing at home, son is currently staying with  her, significant other [name] [name]. [facility] [place] [facility]. Eating good. Endorses occasional shortness of breath with exertion; edema has improved to BLE. Occasional pain to her back; takes tramadol or oxycodone PRN. Doesn't always wear her back brace. Family reports a fall yesterday; denies any apparent injury. Family states they are unclear on if patient's amiodarone needs to be restarted.  MAR's from [facility] reviewed and patient had been receiving amiodarone.  [name] [name] states the pill packs that we receive from facility were not punched out in order; patient has not received her amiodarone since returning home. Reviewed medications need to restart spironolactone and call placed to cardiologist regarding her amiodarone.  History obtained from review of EMR, discussion with primary team, and interview with family, facility staff/caregiver and/or [name].  I reviewed available labs, medications, imaging, studies and related documents from the EMR.  Records reviewed and summarized above.  ROS  General: NAD EYES: denies vision changes ENMT: denies dysphagia Cardiovascular: denies chest pain, denies DOE Pulmonary: denies cough, SOB with exertion Abdomen: endorses good appetite, +constipation, endorses continence of bowel GU: denies dysuria, endorses continence of urine MSK:  endorses weakness Skin: denies rashes or skin breakdown Neurological: denies pain, denies insomnia Psych: Endorses positive mood Heme/lymph/immuno: denies bruises, abnormal bleeding  Physical Exam: Weight: 102 pounds Pulse 78, resp 16, b/p 130/78, sats 96% on room air Constitutional: NAD General: frail appearing, thin EYES: anicteric sclera, lids intact, no discharge  ENMT: slight hard of hearing, oral mucous membranes moist, dentition intact CV: S1S2, RRR, no LE edema Pulmonary: LCTA, no increased work of breathing, no cough, room air Abdomen: normo-active BS + 4 quadrants, soft and non tender,  no ascites GU: deferred MSK: moves all extremities, ambulatory with walker Skin: warm and dry, no rashes or wounds on visible skin Neuro: generalized weakness, A & O x 2 Psych: non-anxious affect, pleasant Hem/lymph/immuno: no widespread bruising CURRENT PROBLEM LIST:  Patient Active Problem List   Diagnosis Date Noted   Malnutrition of moderate degree 09/30/2020   Palliative care encounter    Atrial fibrillation with RVR ([place]) 09/28/2020   Dementia without behavioral disturbance ([place]) 09/28/2020   Proctitis 09/28/2020   Acute CHF (congestive heart failure) ([place]) 09/09/2020   Decubitus ulcer of coccygeal region, stage 2 ([place]) 08/07/2020   Confusion, postoperative 08/07/2020   Acute pulmonary edema ([place]) 08/07/2020   Diverticular disease 08/07/2020   GERD (gastroesophageal reflux disease) 08/07/2020   History of Bell's palsy 08/07/2020   Mitral regurgitation 08/07/2020   Cancer of ascending colon s/p lap colectomy 08/04/2020 06/01/2020   Colonic mass    Closed intertrochanteric fracture ([place]) 04/12/2020   Colles' fracture 04/12/2020   Osteoarthritis of knee 04/12/2020   Symptomatic anemia 03/25/2020   HLD (hyperlipidemia) 03/25/2020   Bilateral leg edema 02/04/2020   Rapid atrial fibrillation ([place]) 01/11/2020   Chronic anticoagulation 01/11/2020   Chronic ulcer of left leg ([place]) 01/11/2020   Iron deficiency anemia 01/11/2020   Hypokalemia 01/01/2020   Left leg cellulitis 01/01/2020   Iron deficiency anemia due to chronic blood loss 01/01/2020   Wound cellulitis 12/31/2019   Aortic atherosclerosis ([place]) 09/25/2019   HOH (hard of hearing) 08/31/2019   History of pelvic fracture  08/31/2019   Pelvic fracture ([place]) 08/25/2019   Permanent atrial fibrillation ([place]) 08/25/2019   Pain    Dehydration    Accidental fall 07/26/2019   UTI (urinary tract infection) 07/26/2019   Closed fracture of right inferior pubic ramus ([place]) 07/26/2019   HTN (hypertension) 07/26/2019   Ambulatory  dysfunction 07/26/2019   Pubic bone fracture ([place]) 07/26/2019   History of colon polyps 06/25/2017   Constipation, chronic 07/27/2013   Osteopenia 07/27/2013   PAST MEDICAL HISTORY:  Active Ambulatory Problems    Diagnosis Date Noted   Accidental fall 07/26/2019   UTI (urinary tract infection) 07/26/2019   Closed fracture of right inferior pubic ramus ([place]) 07/26/2019   HTN (hypertension) 07/26/2019   Ambulatory dysfunction 07/26/2019   Pubic bone fracture (H[facility]) 07/26/2019   Dehydration    Pain    Pelvic fracture ([facility]CC) 08/25/2019   Permanent atrial fibrillation ([place]) 08/25/2019   Wound cellulitis 12/31/2019   Hypokalemia 01/01/2020   Left leg cellulitis 01/01/2020   Iron deficiency anemia due to chronic blood loss 01/01/2020   Rapid atrial fibrillation ([place]) 01/11/2020   Chronic anticoagulation 01/11/2020   Chronic ulcer of left leg ([place]) 01/11/2020   Iron deficiency anemia 01/11/2020   Symptomatic anemia 03/25/2020   HLD (hyperlipidemia) 03/25/2020   Aortic atherosclerosis ([place]) 09/25/2019   Bilateral leg edema 02/04/2020   Closed intertrochanteric fracture ([place]) 04/12/2020   Colles' fracture 04/12/2020   Constipation, chronic 07/27/2013   HOH (hard of hearing) 08/31/2019   History of colon polyps 06/25/2017   History of pelvic fracture 08/31/2019   Osteoarthritis of knee 04/12/2020   Osteopenia 07/27/2013   Colonic mass    Cancer of ascending colon s/p lap colectomy 08/04/2020 06/01/2020   Decubitus ulcer of coccygeal region, stage 2 ([place]) 08/07/2020   Confusion, postoperative 08/07/2020   Acute pulmonary edema ([place]) 08/07/2020   Diverticular disease 08/07/2020   GERD (gastroesophageal reflux disease) 08/07/2020   History of Bell's palsy 08/07/2020   Mitral regurgitation 08/07/2020   Acute CHF (congestive heart failure) ([place]) 09/09/2020   Atrial fibrillation with RVR ([place]) 09/28/2020   Dementia without behavioral disturbance ([place]) 09/28/2020   Proctitis 09/28/2020    Palliative care encounter    Malnutrition of moderate degree 09/30/2020   Resolved Ambulatory Problems    Diagnosis Date Noted   No Resolved Ambulatory Problems   Past Medical History:  Diagnosis Date   Anemia    Anxiety    Arrhythmia    Cancer ([place])    CHF (congestive heart failure) ([place])    Colon cancer ([place])    Deaf, left    Hearing aid worn    Hearing deficit    Hypertension    Internal hemorrhoids    Pure hypercholesterolemia    SOCIAL HX:  Social History   Tobacco Use   Smoking status: Former    Types: Cigarettes    Quit date: 10/30/1985    Years since quitting: 35.0   Smokeless tobacco: Never  Substance Use Topics   Alcohol use: No   FAMILY HX:  Family History  Problem Relation Age of Onset   Colon cancer Mother      ALLERGIES:  Allergies  Allergen Reactions   Metoprolol Swelling    Feet, ankles and legs    Bactrim [Sulfamethoxazole-Trimethoprim] Other (See Comments)    Hearing loss   Ciprofloxacin Other (See Comments)    Flu-like symptoms   Pneumococcal Vaccines Other (See Comments)    Flu-like symptoms     PERTINENT MEDICATIONS:  Outpatient Encounter Medications as of 11/22/2020  Medication Sig   acetaminophen (TYLENOL) 500 MG tablet Take 1 tablet (500 mg total) by mouth every 6 (six) hours as needed for mild pain (or Fever >/= 101).   amiodarone (PACERONE) 200 MG tablet Take 1 tablet (200 mg total) by mouth 2 (two) times daily.   bisacodyl (DULCOLAX) 10 MG suppository Place 1 suppository (10 mg total) rectally daily as needed for moderate constipation.   calcium carbonate (TUMS - DOSED IN MG ELEMENTAL CALCIUM) 500 MG chewable tablet Chew 500 mg by mouth daily as needed for indigestion or heartburn.   diltiazem (CARDIZEM CD) 120 MG 24 hr capsule Take 1 capsule (120 mg total) by mouth daily.   fluticasone (FLONASE) 50 MCG/ACT nasal spray Place 1 spray into both nostrils daily.   furosemide (LASIX) 20 MG tablet Take 1 tablet (20 mg total) by mouth  daily.   iron polysaccharides (NIFEREX) 150 MG capsule Take 1 capsule (150 mg total) by mouth daily.   lovastatin (MEVACOR) 20 MG tablet Take 20 mg by mouth at bedtime.   oxyCODONE (OXY IR/ROXICODONE) 5 MG immediate release tablet Take 1-2 tablets (5-10 mg total) by mouth every 4 (four) hours as needed for severe pain.   pantoprazole (PROTONIX) 40 MG tablet Take 1 tablet (40 mg total) by mouth 2 (two) times daily before a meal. (Patient taking differently: Take 40 mg by mouth 2 (two) times daily as needed (acid reflux).)   Polyethyl Glycol-Propyl Glycol (SYSTANE OP) Place 1 drop into both eyes in the morning and at bedtime.   polyethylene glycol (MIRALAX / GLYCOLAX) 17 g packet Take 17 g by mouth daily.   Rivaroxaban (XARELTO) 15 MG TABS tablet Take 1 tablet (15 mg total) by mouth daily with supper. (Patient not taking: Reported on 11/15/2020)   sacubitril-valsartan (ENTRESTO) 24-26 MG Take 1 tablet by mouth 2 (two) times daily.   senna-docusate (SENOKOT-S) 8.6-50 MG tablet Take 1 tablet by mouth 2 (two) times daily.   spironolactone (ALDACTONE) 25 MG tablet Take 0.5 tablets (12.5 mg total) by mouth daily. (Patient not taking: Reported on 11/15/2020)   traMADol (ULTRAM) 50 MG tablet Take 1 tablet (50 mg total) by mouth every 6 (six) hours as needed for moderate pain.   tretinoin (RETIN-A) 0.05 % cream Apply 1 application topically at bedtime.   No facility-administered encounter medications on file as of 11/22/2020.   Thank you for the opportunity to participate in the care of Ms. [place].  The palliative care team will continue to follow. Please call our office at [phone] if we can be of additional assistance.   Ezekiel Slocumb, NP ,   COVID-19 PATIENT SCREENING TOOL Asked and negative response unless otherwise noted:  Have you had symptoms of covid, tested positive or been in contact with someone with symptoms/positive test in the past 5-10 days? No

## 2020-11-28 ENCOUNTER — Other Ambulatory Visit: Payer: Self-pay | Admitting: Family Medicine

## 2020-11-28 ENCOUNTER — Other Ambulatory Visit (HOSPITAL_COMMUNITY): Payer: Self-pay | Admitting: Family Medicine

## 2020-11-28 DIAGNOSIS — S32020A Wedge compression fracture of second lumbar vertebra, initial encounter for closed fracture: Secondary | ICD-10-CM

## 2020-12-02 ENCOUNTER — Ambulatory Visit: Admission: RE | Admit: 2020-12-02 | Payer: Medicare Other | Source: Ambulatory Visit

## 2020-12-05 ENCOUNTER — Ambulatory Visit
Admission: RE | Admit: 2020-12-05 | Discharge: 2020-12-05 | Disposition: A | Discharge status: Home / Self Care | Payer: Medicare Other | Source: Ambulatory Visit | Attending: Oncology | Admitting: Oncology

## 2020-12-05 DIAGNOSIS — C182 Malignant neoplasm of ascending colon: Secondary | ICD-10-CM | POA: Diagnosis present

## 2020-12-05 LAB — POCT I-STAT CREATININE: Creatinine, Ser: 0.6 mg/dL (ref 0.44–1.00)

## 2020-12-05 MED ORDER — IOHEXOL 300 MG/ML  SOLN
75.0000 mL | Freq: Once | INTRAMUSCULAR | Status: AC | PRN
  Administered 2020-12-05: 75 mL via INTRAVENOUS

## 2020-12-09 ENCOUNTER — Telehealth: Payer: Self-pay

## 2020-12-09 NOTE — Telephone Encounter (Signed)
1238 pm.  Request received to contact Sanjuana Letters @ [phone] regarding patient.  Return call made and no answer.  Message has been left requesting a call back.

## 2020-12-11 ENCOUNTER — Other Ambulatory Visit: Payer: Self-pay | Admitting: *Deleted

## 2020-12-11 DIAGNOSIS — C182 Malignant neoplasm of ascending colon: Secondary | ICD-10-CM

## 2020-12-12 ENCOUNTER — Encounter: Payer: Self-pay | Admitting: Oncology

## 2020-12-12 ENCOUNTER — Inpatient Hospital Stay (HOSPITAL_BASED_OUTPATIENT_CLINIC_OR_DEPARTMENT_OTHER): Payer: Medicare Other | Admitting: Hospice and Palliative Medicine

## 2020-12-12 ENCOUNTER — Inpatient Hospital Stay (HOSPITAL_BASED_OUTPATIENT_CLINIC_OR_DEPARTMENT_OTHER): Payer: Medicare Other | Admitting: Oncology

## 2020-12-12 ENCOUNTER — Other Ambulatory Visit: Payer: Self-pay

## 2020-12-12 ENCOUNTER — Inpatient Hospital Stay: Payer: Medicare Other | Attending: Oncology

## 2020-12-12 VITALS — BP 119/65 | HR 79 | Temp 98.4°F | Resp 16 | Ht 62.0 in | Wt 107.5 lb

## 2020-12-12 DIAGNOSIS — I4891 Unspecified atrial fibrillation: Secondary | ICD-10-CM | POA: Diagnosis not present

## 2020-12-12 DIAGNOSIS — C182 Malignant neoplasm of ascending colon: Secondary | ICD-10-CM | POA: Insufficient documentation

## 2020-12-12 DIAGNOSIS — I509 Heart failure, unspecified: Secondary | ICD-10-CM | POA: Insufficient documentation

## 2020-12-12 DIAGNOSIS — Z7189 Other specified counseling: Secondary | ICD-10-CM | POA: Diagnosis not present

## 2020-12-12 DIAGNOSIS — M8580 Other specified disorders of bone density and structure, unspecified site: Secondary | ICD-10-CM | POA: Diagnosis not present

## 2020-12-12 DIAGNOSIS — Z79899 Other long term (current) drug therapy: Secondary | ICD-10-CM | POA: Diagnosis not present

## 2020-12-12 DIAGNOSIS — C787 Secondary malignant neoplasm of liver and intrahepatic bile duct: Secondary | ICD-10-CM | POA: Insufficient documentation

## 2020-12-12 DIAGNOSIS — I110 Hypertensive heart disease with heart failure: Secondary | ICD-10-CM | POA: Diagnosis not present

## 2020-12-12 DIAGNOSIS — Z515 Encounter for palliative care: Secondary | ICD-10-CM

## 2020-12-12 DIAGNOSIS — Z7901 Long term (current) use of anticoagulants: Secondary | ICD-10-CM | POA: Diagnosis not present

## 2020-12-12 DIAGNOSIS — E7800 Pure hypercholesterolemia, unspecified: Secondary | ICD-10-CM | POA: Diagnosis not present

## 2020-12-12 LAB — COMPREHENSIVE METABOLIC PANEL
ALT: 19 U/L (ref 0–44)
AST: 28 U/L (ref 15–41)
Albumin: 3.9 g/dL (ref 3.5–5.0)
Alkaline Phosphatase: 104 U/L (ref 38–126)
Anion gap: 9 (ref 5–15)
BUN: 18 mg/dL (ref 8–23)
CO2: 28 mmol/L (ref 22–32)
Calcium: 9.5 mg/dL (ref 8.9–10.3)
Chloride: 102 mmol/L (ref 98–111)
Creatinine, Ser: 0.75 mg/dL (ref 0.44–1.00)
GFR, Estimated: >60 mL/min (ref >60)
Glucose, Bld: 158 mg/dL — ABNORMAL HIGH (ref 70–99)
Potassium: 3.7 mmol/L (ref 3.5–5.1)
Sodium: 139 mmol/L (ref 135–145)
Total Bilirubin: 0.5 mg/dL (ref 0.3–1.2)
Total Protein: 8.3 g/dL — ABNORMAL HIGH (ref 6.5–8.1)

## 2020-12-12 LAB — CBC WITH DIFFERENTIAL/PLATELET
Abs Immature Granulocytes: 0.03 10*3/uL (ref 0.00–0.07)
Basophils Absolute: 0.1 10*3/uL (ref 0.0–0.1)
Basophils Relative: 1 %
Eosinophils Absolute: 0.3 10*3/uL (ref 0.0–0.5)
Eosinophils Relative: 4 %
HCT: 42.6 % (ref 36.0–46.0)
Hemoglobin: 13.5 g/dL (ref 12.0–15.0)
Immature Granulocytes: 0 %
Lymphocytes Relative: 16 %
Lymphs Abs: 1.1 10*3/uL (ref 0.7–4.0)
MCH: 29 pg (ref 26.0–34.0)
MCHC: 31.7 g/dL (ref 30.0–36.0)
MCV: 91.6 fL (ref 80.0–100.0)
Monocytes Absolute: 0.9 10*3/uL (ref 0.1–1.0)
Monocytes Relative: 12 %
Neutro Abs: 4.8 10*3/uL (ref 1.7–7.7)
Neutrophils Relative %: 67 %
Platelets: 329 10*3/uL (ref 150–400)
RBC: 4.65 MIL/uL (ref 3.87–5.11)
RDW: 18.6 % — ABNORMAL HIGH (ref 11.5–15.5)
WBC: 7.2 10*3/uL (ref 4.0–10.5)
nRBC: 0 % (ref 0.0–0.2)

## 2020-12-12 NOTE — Progress Notes (Signed)
Hematology/Oncology Consult note [facility]  Telephone:([phone] [phone]  Patient Care Team: [name], MD as PCP - General (Family Medicine) Clent Jacks, RN as Oncology Nurse Navigator [name], MD as Consulting Physician (Colon and Rectal Surgery) Lin Landsman, MD as Consulting Physician (Gastroenterology)   Name of the patient: [name] [name]  [id number]  15-Jul-1929   Date of visit: 12/12/20  Diagnosis-history of stage III colon cancer now with liver metastases  Chief complaint/ Reason for visit-discuss CT scan results and further management  Heme/Onc history: Patient is a 87 year old female who was referred for iron deficiency anemia and received IV iron.She also underwent EGD and colonoscopy with Dr. Marius Ditch.  EGD was unremarkable but colonoscopy showed a partially obstructing mass at the ileocecal valve with oozing present.  This was biopsied and consistent with adenocarcinoma.  She had other polyps noted in the colon and rectum as well which were biopsied and was negative for dysplasia or malignancy.   Patient underwent hemicolectomy on 08/04/2020.  Final pathology showed 5 cm moderately differentiated adenocarcinoma with negative margins.  4 out of 21 lymph nodes positive for malignancy.  Tumor extension invades visceral peritoneum.  No lymphovascular or perineural invasion.  [id number]  Patient declined adjuvant chemotherapy.  She was hospitalized againHer August 2022 for symptoms of abdominal pain and CT scan showed a 1.9 cm indeterminate lesion in the liver of concerning for liver metastases.  Patient did not desire any intervention at that time.  Interval history-patient is here with her son today.  She reports doing well overall for her age.  Appetite and weight have remained stable.  She is hard of hearing  ECOG PS- 2 Pain scale- 0   Review of systems- Review of Systems  Constitutional:  Positive  for malaise/fatigue. Negative for chills, fever and weight loss.  HENT:  Negative for congestion, ear discharge and nosebleeds.   Eyes:  Negative for blurred vision.  Respiratory:  Negative for cough, hemoptysis, sputum production, shortness of breath and wheezing.   Cardiovascular:  Negative for chest pain, palpitations, orthopnea and claudication.  Gastrointestinal:  Negative for abdominal pain, blood in stool, constipation, diarrhea, heartburn, melena, nausea and vomiting.  Genitourinary:  Negative for dysuria, flank pain, frequency, hematuria and urgency.  Musculoskeletal:  Negative for back pain, joint pain and myalgias.  Skin:  Negative for rash.  Neurological:  Negative for dizziness, tingling, focal weakness, seizures, weakness and headaches.  Endo/Heme/Allergies:  Does not bruise/bleed easily.  Psychiatric/Behavioral:  Negative for depression and suicidal ideas. The patient does not have insomnia.       Allergies  Allergen Reactions   Metoprolol Swelling    Feet, ankles and legs    Bactrim [Sulfamethoxazole-Trimethoprim] Other (See Comments)    Hearing loss   Ciprofloxacin Other (See Comments)    Flu-like symptoms   Pneumococcal Vaccines Other (See Comments)    Flu-like symptoms     Past Medical History:  Diagnosis Date   Anemia    Anxiety    Arrhythmia    atrial fibrillation   Cancer (HCC)    Squamous Cell Carcinoma (top of head)   CHF (congestive heart failure) (HCC)    Colon cancer (HCC)    Constipation, chronic    Deaf, left    Diverticular disease    GERD (gastroesophageal reflux disease)    Hearing aid worn    Hearing deficit    History of Bell's palsy    left side - resolved -  50 yrs ago   Hypertension    Internal hemorrhoids    Iron deficiency anemia    Mitral regurgitation    moderate to severe on 03/27/20 echo   Osteopenia    Pure hypercholesterolemia      Past Surgical History:  Procedure Laterality Date   CATARACT EXTRACTION W/PHACO Left  11/16/2015   Procedure: CATARACT EXTRACTION PHACO AND INTRAOCULAR LENS PLACEMENT ([place]);  Surgeon: Leandrew Koyanagi, MD;  Location: [place];  Service: Ophthalmology;  Laterality: Left;  TORIC   CATARACT EXTRACTION W/PHACO Right 12/07/2015   Procedure: CATARACT EXTRACTION PHACO AND INTRAOCULAR LENS PLACEMENT ([facility]);  Surgeon: Leandrew Koyanagi, MD;  Location: [place];  Service: Ophthalmology;  Laterality: Right;  TORIC   COLONOSCOPY WITH PROPOFOL N/A 08/10/2014   Procedure: COLONOSCOPY WITH PROPOFOL;  Surgeon: Lollie Sails, MD;  Location: [facility] ENDOSCOPY;  Service: Endoscopy;  Laterality: N/A;   COLONOSCOPY WITH PROPOFOL N/A 05/12/2020   Procedure: COLONOSCOPY WITH PROPOFOL;  Surgeon: Lin Landsman, MD;  Location: [facility] ENDOSCOPY;  Service: Gastroenterology;  Laterality: N/A;   ESOPHAGOGASTRODUODENOSCOPY (EGD) WITH PROPOFOL N/A 03/28/2020   Procedure: ESOPHAGOGASTRODUODENOSCOPY (EGD) WITH PROPOFOL;  Surgeon: Lucilla Lame, MD;  Location: [place] [facility] ENDOSCOPY;  Service: Endoscopy;  Laterality: N/A;   EYE SURGERY     FEMUR FRACTURE SURGERY     FRACTURE SURGERY     GIVENS CAPSULE STUDY N/A 04/19/2020   Procedure: GIVENS CAPSULE STUDY;  Surgeon: Lin Landsman, MD;  Location: [facility] ENDOSCOPY;  Service: Gastroenterology;  Laterality: N/A;   LAPAROSCOPIC RIGHT HEMI COLECTOMY Right 08/04/2020   Procedure: LAPAROSCOPIC RIGHT HEMI COLECTOMY;  Surgeon: [name], MD;  Location: [facility];  Service: General;  Laterality: Right;   Nasal Polyp Removal      Social History   Socioeconomic History   Marital status: Widowed    Spouse name: Not on file   Number of children: Not on file   Years of education: Not on file   Highest education level: Not on file  Occupational History   Not on file  Tobacco Use   Smoking status: Former    Types: Cigarettes    Quit date: 10/30/1985    Years since quitting: 35.1   Smokeless tobacco: Never  Vaping Use   Vaping Use: Never  used  Substance and Sexual Activity   Alcohol use: No   Drug use: No   Sexual activity: Not Currently  Other Topics Concern   Not on file  Social History Narrative   Not on file   Social Determinants of Health   Financial Resource Strain: Not on file  Food Insecurity: Not on file  Transportation Needs: Not on file  Physical Activity: Not on file  Stress: Not on file  Social Connections: Not on file  Intimate Partner Violence: Not on file    Family History  Problem Relation Age of Onset   Colon cancer Mother      Current Outpatient Medications:    acetaminophen (TYLENOL) 500 MG tablet, Take 1 tablet (500 mg total) by mouth every 6 (six) hours as needed for mild pain (or Fever >/= 101)., Disp: 30 tablet, Rfl: 0   bisacodyl (DULCOLAX) 10 MG suppository, Place 1 suppository (10 mg total) rectally daily as needed for moderate constipation., Disp: 12 suppository, Rfl: 0   calcium carbonate (TUMS - DOSED IN MG ELEMENTAL CALCIUM) 500 MG chewable tablet, Chew 500 mg by mouth daily as needed for indigestion or heartburn., Disp: , Rfl:    diltiazem (CARDIZEM  CD) 120 MG 24 hr capsule, Take 1 capsule (120 mg total) by mouth daily., Disp: , Rfl:    fluticasone (FLONASE) 50 MCG/ACT nasal spray, Place 1 spray into both nostrils daily., Disp: , Rfl:    furosemide (LASIX) 20 MG tablet, Take 1 tablet (20 mg total) by mouth daily., Disp: , Rfl:    iron polysaccharides (NIFEREX) 150 MG capsule, Take 1 capsule (150 mg total) by mouth daily., Disp: 30 capsule, Rfl: 0   lovastatin (MEVACOR) 20 MG tablet, Take 20 mg by mouth at bedtime., Disp: , Rfl:    oxyCODONE (OXY IR/ROXICODONE) 5 MG immediate release tablet, Take 1-2 tablets (5-10 mg total) by mouth every 4 (four) hours as needed for severe pain., Disp: 30 tablet, Rfl: 0   pantoprazole (PROTONIX) 40 MG tablet, Take 1 tablet (40 mg total) by mouth 2 (two) times daily before a meal. (Patient taking differently: Take 40 mg by mouth 2 (two) times daily  as needed (acid reflux).), Disp: 60 tablet, Rfl: 2   Polyethyl Glycol-Propyl Glycol (SYSTANE OP), Place 1 drop into both eyes in the morning and at bedtime., Disp: , Rfl:    polyethylene glycol (MIRALAX / GLYCOLAX) 17 g packet, Take 17 g by mouth daily., Disp: 14 each, Rfl: 0   Rivaroxaban (XARELTO) 15 MG TABS tablet, Take 1 tablet (15 mg total) by mouth daily with supper., Disp: 42 tablet, Rfl:    sacubitril-valsartan (ENTRESTO) 24-26 MG, Take 1 tablet by mouth 2 (two) times daily., Disp: 60 tablet, Rfl:    spironolactone (ALDACTONE) 25 MG tablet, Take 0.5 tablets (12.5 mg total) by mouth daily., Disp: , Rfl:    tretinoin (RETIN-A) 0.05 % cream, Apply 1 application topically at bedtime., Disp: , Rfl:    senna-docusate (SENOKOT-S) 8.6-50 MG tablet, Take 1 tablet by mouth 2 (two) times daily. (Patient not taking: Reported on 12/12/2020), Disp: , Rfl:    traMADol (ULTRAM) 50 MG tablet, Take 1 tablet (50 mg total) by mouth every 6 (six) hours as needed for moderate pain. (Patient not taking: Reported on 12/12/2020), Disp: 30 tablet, Rfl:   Physical exam:  Physical Exam Constitutional:      Comments: Frail-appearing elderly woman who ambulates with a walker  Cardiovascular:     Rate and Rhythm: Normal rate and regular rhythm.     Heart sounds: Normal heart sounds.  Pulmonary:     Effort: Pulmonary effort is normal.     Breath sounds: Normal breath sounds.  Abdominal:     General: Bowel sounds are normal.     Palpations: Abdomen is soft.  Skin:    General: Skin is warm and dry.  Neurological:     Mental Status: She is alert and oriented to person, place, and time.     CMP Latest Ref Rng & Units 12/12/2020  Glucose 70 - 99 mg/dL 158(H)  BUN 8 - 23 mg/dL 18  Creatinine 0.44 - 1.00 mg/dL 0.75  Sodium 135 - 145 mmol/L 139  Potassium 3.5 - 5.1 mmol/L 3.7  Chloride 98 - 111 mmol/L 102  CO2 22 - 32 mmol/L 28  Calcium 8.9 - 10.3 mg/dL 9.5  Total Protein 6.5 - 8.1 g/dL 8.3(H)  Total  Bilirubin 0.3 - 1.2 mg/dL 0.5  Alkaline Phos 38 - 126 U/L 104  AST 15 - 41 U/L 28  ALT 0 - 44 U/L 19   CBC Latest Ref Rng & Units 12/12/2020  WBC 4.0 - 10.5 K/uL 7.2  Hemoglobin 12.0 - 15.0 g/dL  13.5  Hematocrit 36.0 - 46.0 % 42.6  Platelets 150 - 400 K/uL 329    No images are attached to the encounter.  CT Abdomen Pelvis W Contrast  Result Date: 12/05/2020 CLINICAL DATA:  Follow-up colon carcinoma. Previous right colectomy. EXAM: CT ABDOMEN AND PELVIS WITH CONTRAST TECHNIQUE: Multidetector CT imaging of the abdomen and pelvis was performed using the standard protocol following bolus administration of intravenous contrast. CONTRAST:  5mL OMNIPAQUE IOHEXOL 300 MG/ML  SOLN COMPARISON:  09/28/2020 FINDINGS: Lower Chest: No acute findings. Previously seen bilateral pleural effusions have resolved. Hepatobiliary: Multiple small benign hepatic cysts remain stable. A hypovascular mass is again seen in segment 4A of the left lobe. This measures 2.9 x 2.2 cm on image 13/2, increased from 2.0 x 1.3 cm previously, and is suspicious for hepatic metastasis. No other new or enlarging liver lesions are identified. Gallbladder is unremarkable. No evidence of biliary ductal dilatation. Pancreas:  No mass or inflammatory changes. Spleen: Within normal limits in size and appearance. Adrenals/Urinary Tract: No masses identified. A few tiny sub-cm renal cysts are again noted. No evidence of ureteral calculi or hydronephrosis. Stomach/Bowel: Small hiatal hernia is again seen. Postop changes are again demonstrated from previous right hemicolectomy. Diverticulosis is seen mainly involving the sigmoid colon, however there is no evidence of diverticulitis. No evidence of obstruction, inflammatory process or abnormal fluid collections. Vascular/Lymphatic: No pathologically enlarged lymph nodes. No acute vascular findings. Aortic atherosclerotic calcification noted. Reproductive:  No mass or other significant abnormality.  Other:  None. Musculoskeletal: No suspicious bone lesions identified. Internal fixation hardware again seen in the right hip. Subacute compression fracture of the L2 vertebral body is seen, with further loss of height demonstrated since prior exam. IMPRESSION: Increased size of 2.9 cm hypovascular mass in left hepatic lobe, highly suspicious for hepatic metastasis. No other sites of metastatic disease identified. Previous right hemicolectomy. Sigmoid diverticulosis, without radiographic evidence of diverticulitis. Small hiatal hernia. Subacute compression fracture of L2 vertebral body, with further loss of height since prior exam. Aortic Atherosclerosis (ICD10-I70.0). Electronically Signed   By: Marlaine Hind M.D.   On: 12/05/2020 17:13     Assessment and plan- Patient is a 87 y.o. female with history of stage III colon cancer s/p surgery now presenting with solitary liver metastases  Patient was found to have an incidental 1.9 cm left liver lesionBased on CT scan done in August 2022.  Patient did not desire any intervention at that time and was agreeable to a repeat CT in 3 months.  CT on 12/05/2020 shows further increase in the size of segment 4A left lobe lesion which is now 2.9 x 2.2 cm.  I did discuss her case with interventional radiology Dr. Annamaria Boots who recommends ideally attempting systemic chemotherapy for 3 months to see if this lesion would shrink.  At this time the liver lesion is fairly large and therefore risk of complications associated with liver directed therapy are also high.  I have discussed all this with patient and her son.  Patient does not wish to attempt any systemic chemotherapy at this time which is understandable given her age and frailty.  In the absence of any systemic treatment I explained to them that the liver lesion is likely going to grow further and she may have new areas of metastatic disease in the future.  I would therefore recommend best supportive care/hospice for  metastatic colon cancer that is untreated.  Her overall prognosis is likely less than 6 months.  I will  have her see palliative care to see if her as well to make the transition to hospice.   Visit Diagnosis 1. Goals of care, counseling/discussion   2. Liver metastases ([place])   3. Cancer of ascending colon s/p lap colectomy 08/04/2020      Dr. Randa Evens, MD, MPH [facility] [phone] 12/12/2020 2:58 PM

## 2020-12-12 NOTE — Progress Notes (Signed)
[place]  [phone] [phone]   Name: [name] [name] Date: 12/12/2020 MRN: [id number]  DOB: July 16, 1929  Patient Care Team: [name], MD as PCP - General (Family Medicine) Clent Jacks, RN as Oncology Nurse Navigator [name], MD as Consulting Physician (Colon and Rectal Surgery) Lin Landsman, MD as Consulting Physician (Gastroenterology)    REASON FOR CONSULTATION: [name] [name] is a 87 y.o. female with multiple medical problems including [name] [name] is a 87 y.o. female with multiple medical problems including systolic cardiomyopathy with EF of 30 to 35% and history of stage III invasive adenocarcinoma of the colon status post hemicolectomy on 08/04/2020.  Patient was hospitalized 09/09/2020-09/12/2020 with CHF, 09/28/2020-10/03/2020 with possible sepsis from UTI and A. fib with RVR.  Patient was incidentally found to have a new indeterminate liver mass on CT.  Patient has declined systemic chemotherapy.  Palliative care was consulted to help address goals.  SOCIAL HISTORY:     reports that [name] [name] quit smoking about 35 years ago. Her smoking use included cigarettes. [name] [name] has never used smokeless tobacco. [name] [name] reports that [name] [name] does not drink alcohol and does not use drugs.  Patient is widowed.  [name] [name] had a son who is now deceased.  [name] [name] has another son who moved recently from [place] to live with her.  ADVANCE DIRECTIVES:  Not on file  CODE STATUS: DNR/DNI (DNR order signed on 12/13/2018)  PAST MEDICAL HISTORY: Past Medical History:  Diagnosis Date   Anemia    Anxiety    Arrhythmia    atrial fibrillation   Cancer (HCC)    Squamous Cell Carcinoma (top of head)   CHF (congestive heart failure) (HCC)    Colon cancer (HCC)    Constipation, chronic    Deaf, left    Diverticular disease    GERD (gastroesophageal reflux disease)    Hearing aid worn    Hearing deficit     History of Bell's palsy    left side - resolved - 50 yrs ago   Hypertension    Internal hemorrhoids    Iron deficiency anemia    Mitral regurgitation    moderate to severe on 03/27/20 echo   Osteopenia    Pure hypercholesterolemia     PAST SURGICAL HISTORY:  Past Surgical History:  Procedure Laterality Date   CATARACT EXTRACTION W/PHACO Left 11/16/2015   Procedure: CATARACT EXTRACTION PHACO AND INTRAOCULAR LENS PLACEMENT ([place]);  Surgeon: Leandrew Koyanagi, MD;  Location: [place];  Service: Ophthalmology;  Laterality: Left;  TORIC   CATARACT EXTRACTION W/PHACO Right 12/07/2015   Procedure: CATARACT EXTRACTION PHACO AND INTRAOCULAR LENS PLACEMENT ([facility]);  Surgeon: Leandrew Koyanagi, MD;  Location: [place];  Service: Ophthalmology;  Laterality: Right;  TORIC   COLONOSCOPY WITH PROPOFOL N/A 08/10/2014   Procedure: COLONOSCOPY WITH PROPOFOL;  Surgeon: Lollie Sails, MD;  Location: [facility] ENDOSCOPY;  Service: Endoscopy;  Laterality: N/A;   COLONOSCOPY WITH PROPOFOL N/A 05/12/2020   Procedure: COLONOSCOPY WITH PROPOFOL;  Surgeon: Lin Landsman, MD;  Location: [facility] ENDOSCOPY;  Service: Gastroenterology;  Laterality: N/A;   ESOPHAGOGASTRODUODENOSCOPY (EGD) WITH PROPOFOL N/A 03/28/2020   Procedure: ESOPHAGOGASTRODUODENOSCOPY (EGD) WITH PROPOFOL;  Surgeon: Lucilla Lame, MD;  Location: [facility] ENDOSCOPY;  Service: Endoscopy;  Laterality: N/A;   EYE SURGERY     FEMUR FRACTURE SURGERY     FRACTURE SURGERY     GIVENS CAPSULE STUDY N/A 04/19/2020   Procedure: GIVENS CAPSULE STUDY;  Surgeon: Lin Landsman, MD;  Location: [facility] ENDOSCOPY;  Service: Gastroenterology;  Laterality: N/A;   LAPAROSCOPIC RIGHT HEMI COLECTOMY Right 08/04/2020   Procedure: LAPAROSCOPIC RIGHT HEMI COLECTOMY;  Surgeon: [name], MD;  Location: [facility];  Service: General;  Laterality: Right;   Nasal Polyp Removal      HEMATOLOGY/ONCOLOGY HISTORY:  Oncology History  Cancer of  ascending colon s/p lap colectomy 08/04/2020  06/01/2020 Initial Diagnosis   Cancer of ascending colon s/p lap colectomy 08/04/2020   08/24/2020 Cancer Staging   Staging form: Colon and Rectum, AJCC 8th Edition - Pathologic stage from 08/24/2020: Stage IIIC (pT4a, pN2a, cM0) - Signed by Sindy Guadeloupe, MD on 08/24/2020 Histologic grading system: 4 grade system Histologic grade (G): G2 Residual tumor (R): R0 - None      ALLERGIES:  is allergic to metoprolol, bactrim [sulfamethoxazole-trimethoprim], ciprofloxacin, and pneumococcal vaccines.  MEDICATIONS:  Current Outpatient Medications  Medication Sig Dispense Refill   acetaminophen (TYLENOL) 500 MG tablet Take 1 tablet (500 mg total) by mouth every 6 (six) hours as needed for mild pain (or Fever >/= 101). 30 tablet 0   bisacodyl (DULCOLAX) 10 MG suppository Place 1 suppository (10 mg total) rectally daily as needed for moderate constipation. 12 suppository 0   calcium carbonate (TUMS - DOSED IN MG ELEMENTAL CALCIUM) 500 MG chewable tablet Chew 500 mg by mouth daily as needed for indigestion or heartburn.     diltiazem (CARDIZEM CD) 120 MG 24 hr capsule Take 1 capsule (120 mg total) by mouth daily.     fluticasone (FLONASE) 50 MCG/ACT nasal spray Place 1 spray into both nostrils daily.     furosemide (LASIX) 20 MG tablet Take 1 tablet (20 mg total) by mouth daily.     iron polysaccharides (NIFEREX) 150 MG capsule Take 1 capsule (150 mg total) by mouth daily. 30 capsule 0   lovastatin (MEVACOR) 20 MG tablet Take 20 mg by mouth at bedtime.     oxyCODONE (OXY IR/ROXICODONE) 5 MG immediate release tablet Take 1-2 tablets (5-10 mg total) by mouth every 4 (four) hours as needed for severe pain. 30 tablet 0   pantoprazole (PROTONIX) 40 MG tablet Take 1 tablet (40 mg total) by mouth 2 (two) times daily before a meal. (Patient taking differently: Take 40 mg by mouth 2 (two) times daily as needed (acid reflux).) 60 tablet 2   Polyethyl Glycol-Propyl Glycol  (SYSTANE OP) Place 1 drop into both eyes in the morning and at bedtime.     polyethylene glycol (MIRALAX / GLYCOLAX) 17 g packet Take 17 g by mouth daily. 14 each 0   Rivaroxaban (XARELTO) 15 MG TABS tablet Take 1 tablet (15 mg total) by mouth daily with supper. 42 tablet    sacubitril-valsartan (ENTRESTO) 24-26 MG Take 1 tablet by mouth 2 (two) times daily. 60 tablet    senna-docusate (SENOKOT-S) 8.6-50 MG tablet Take 1 tablet by mouth 2 (two) times daily. (Patient not taking: Reported on 12/12/2020)     spironolactone (ALDACTONE) 25 MG tablet Take 0.5 tablets (12.5 mg total) by mouth daily.     traMADol (ULTRAM) 50 MG tablet Take 1 tablet (50 mg total) by mouth every 6 (six) hours as needed for moderate pain. (Patient not taking: Reported on 12/12/2020) 30 tablet    tretinoin (RETIN-A) 0.05 % cream Apply 1 application topically at bedtime.     No current facility-administered medications for this visit.    VITAL SIGNS: There were no vitals taken for  this visit. There were no vitals filed for this visit.  Estimated body mass index is 19.66 kg/m as calculated from the following:   Height as of an earlier encounter on 12/12/20: $RemoveBefo'5\' 2"'dzSiSQinWjv$  (1.575 m).   Weight as of an earlier encounter on 12/12/20: 107 lb 8 oz (48.8 kg).  LABS: CBC:    Component Value Date/Time   WBC 7.2 12/12/2020 1304   HGB 13.5 12/12/2020 1304   HGB 10.0 (L) 06/04/2012 0510   HCT 42.6 12/12/2020 1304   HCT 28.8 (L) 06/04/2012 0510   PLT 329 12/12/2020 1304   PLT 170 06/04/2012 0510   MCV 91.6 12/12/2020 1304   MCV 90 06/04/2012 0510   NEUTROABS 4.8 12/12/2020 1304   NEUTROABS 7.3 (H) 06/04/2012 0510   LYMPHSABS 1.1 12/12/2020 1304   LYMPHSABS 2.0 06/04/2012 0510   MONOABS 0.9 12/12/2020 1304   MONOABS 1.9 (H) 06/04/2012 0510   EOSABS 0.3 12/12/2020 1304   EOSABS 0.3 06/04/2012 0510   BASOSABS 0.1 12/12/2020 1304   BASOSABS 0.1 06/04/2012 0510   Comprehensive Metabolic Panel:    Component Value Date/Time    NA 139 12/12/2020 1304   NA 137 07/08/2012 0633   K 3.7 12/12/2020 1304   K 4.1 07/08/2012 0633   CL 102 12/12/2020 1304   CL 102 07/08/2012 0633   CO2 28 12/12/2020 1304   CO2 31 07/08/2012 0633   BUN 18 12/12/2020 1304   BUN 16 07/08/2012 0633   CREATININE 0.75 12/12/2020 1304   CREATININE 0.73 07/08/2012 0633   GLUCOSE 158 (H) 12/12/2020 1304   GLUCOSE 74 07/08/2012 0633   CALCIUM 9.5 12/12/2020 1304   CALCIUM 9.5 07/08/2012 0633   AST 28 12/12/2020 1304   AST 24 05/31/2012 2019   ALT 19 12/12/2020 1304   ALT 26 05/31/2012 2019   ALKPHOS 104 12/12/2020 1304   ALKPHOS 52 05/31/2012 2019   BILITOT 0.5 12/12/2020 1304   BILITOT 0.4 05/31/2012 2019   PROT 8.3 (H) 12/12/2020 1304   PROT 8.1 05/31/2012 2019   ALBUMIN 3.9 12/12/2020 1304   ALBUMIN 4.0 05/31/2012 2019    RADIOGRAPHIC STUDIES: CT Abdomen Pelvis W Contrast  Result Date: 12/05/2020 CLINICAL DATA:  Follow-up colon carcinoma. Previous right colectomy. EXAM: CT ABDOMEN AND PELVIS WITH CONTRAST TECHNIQUE: Multidetector CT imaging of the abdomen and pelvis was performed using the standard protocol following bolus administration of intravenous contrast. CONTRAST:  76mL OMNIPAQUE IOHEXOL 300 MG/ML  SOLN COMPARISON:  09/28/2020 FINDINGS: Lower Chest: No acute findings. Previously seen bilateral pleural effusions have resolved. Hepatobiliary: Multiple small benign hepatic cysts remain stable. A hypovascular mass is again seen in segment 4A of the left lobe. This measures 2.9 x 2.2 cm on image 13/2, increased from 2.0 x 1.3 cm previously, and is suspicious for hepatic metastasis. No other new or enlarging liver lesions are identified. Gallbladder is unremarkable. No evidence of biliary ductal dilatation. Pancreas:  No mass or inflammatory changes. Spleen: Within normal limits in size and appearance. Adrenals/Urinary Tract: No masses identified. A few tiny sub-cm renal cysts are again noted. No evidence of ureteral calculi or  hydronephrosis. Stomach/Bowel: Small hiatal hernia is again seen. Postop changes are again demonstrated from previous right hemicolectomy. Diverticulosis is seen mainly involving the sigmoid colon, however there is no evidence of diverticulitis. No evidence of obstruction, inflammatory process or abnormal fluid collections. Vascular/Lymphatic: No pathologically enlarged lymph nodes. No acute vascular findings. Aortic atherosclerotic calcification noted. Reproductive:  No mass or other significant abnormality. Other:  None. Musculoskeletal: No suspicious bone lesions identified. Internal fixation hardware again seen in the right hip. Subacute compression fracture of the L2 vertebral body is seen, with further loss of height demonstrated since prior exam. IMPRESSION: Increased size of 2.9 cm hypovascular mass in left hepatic lobe, highly suspicious for hepatic metastasis. No other sites of metastatic disease identified. Previous right hemicolectomy. Sigmoid diverticulosis, without radiographic evidence of diverticulitis. Small hiatal hernia. Subacute compression fracture of L2 vertebral body, with further loss of height since prior exam. Aortic Atherosclerosis (ICD10-I70.0). Electronically Signed   By: Marlaine Hind M.D.   On: 12/05/2020 17:13    PERFORMANCE STATUS (ECOG) : 2 - Symptomatic, <50% confined to bed  Review of Systems Unless otherwise noted, a complete review of systems is negative.  Physical Exam General: NAD Pulmonary: Unlabored Extremities: no edema, no joint deformities Skin: no rashes Neurological: Weakness but otherwise nonfocal  IMPRESSION: Patient was an add-on to my clinic schedule today.  [name] [name] met with Dr. Janese Banks and reiterated that [name] [name] was not interested in pursuing cancer treatment.  I met with her to clarify goals.  [name] [name] states clearly and repeatedly that [name] [name] is "ready to die" and is not interested in any life-prolonging measures such as cancer treatment or resuscitation.  [name] [name] is  interested in staying home and focusing on comfort.  Her son verbalized agreement.  Both were also in agreement with DNR/DNI.  We discussed the option of hospice involvement in detail.  Both patient and son were in agreement with hospice following her at home.  Patient reports that her only worry is her son's wellbeing.  He is an active alcoholic and on substance use treatment for opioid addiction.  Son is currently being followed by an addiction specialist.  Discussed possible trigger associated with his mother's decline.  Patient and son will benefit from close supportive relationship by hospice team.  PLAN: -Best supportive care -Referral for hospice -DNR/DNI  Case and plan discussed with Dr. Janese Banks   Patient expressed understanding and was in agreement with this plan. [name] [name] also understands that [name] [name] can call the clinic at any time with any questions, concerns, or complaints.     Time Total: 30 minutes  Visit consisted of counseling and education dealing with the complex and emotionally intense issues of symptom management and palliative care in the setting of serious and potentially life-threatening illness.Greater than 50%  of this time was spent counseling and coordinating care related to the above assessment and plan.  Signed by: Altha Harm, PhD, NP-C

## 2020-12-13 LAB — CEA: CEA: 10.2 ng/mL — ABNORMAL HIGH (ref 0.0–4.7)

## 2020-12-15 ENCOUNTER — Ambulatory Visit: Payer: Medicare Other | Admitting: Family

## 2021-02-06 ENCOUNTER — Telehealth: Payer: Self-pay | Admitting: Hospice and Palliative Medicine

## 2021-02-06 NOTE — Telephone Encounter (Signed)
I received a call from hospice nurse, April.  Patient acutely declining and was lethargic found in bed covered with black tarry stools.  Family reported goal is to keep her home and comfortable and not interested in ER or work-up.  Okay to minimize pill burden and stop any nonessential/comfort meds.  We also discussed hospice facility if needed for symptom management.

## 2021-02-07 ENCOUNTER — Other Ambulatory Visit: Payer: Self-pay | Admitting: Hospice and Palliative Medicine

## 2021-02-07 MED ORDER — MOLNUPIRAVIR EUA 200MG CAPSULE: 4.0000 | ORAL_CAPSULE | Freq: Two times a day (BID) | ORAL | 0 refills | Status: AC

## 2021-02-07 NOTE — Progress Notes (Signed)
I received a call from hospice nurse, April.  Patient tested positive for COVID and family would like to proceed with antiviral treatment.  As she is on hospice, I will hold on checking her labs but suspect renal dysfunction.  Therefore, we will give her molnupiravir.

## 2021-03-13 ENCOUNTER — Other Ambulatory Visit: Payer: Self-pay | Admitting: *Deleted

## 2021-03-13 MED ORDER — PANTOPRAZOLE SODIUM 40 MG PO TBEC: 40.0000 mg | DELAYED_RELEASE_TABLET | Freq: Two times a day (BID) | ORAL | 2 refills | Status: DC

## 2021-05-08 ENCOUNTER — Other Ambulatory Visit: Payer: Self-pay | Admitting: Oncology

## 2021-05-09 ENCOUNTER — Encounter: Payer: Self-pay | Admitting: Oncology

## 2021-05-10 ENCOUNTER — Other Ambulatory Visit: Payer: Self-pay | Admitting: *Deleted

## 2021-05-10 MED ORDER — SACUBITRIL-VALSARTAN 24-26 MG PO TABS: 1.0000 | ORAL_TABLET | Freq: Two times a day (BID) | ORAL | 0 refills | Status: AC

## 2021-06-05 ENCOUNTER — Other Ambulatory Visit: Payer: Self-pay | Admitting: Oncology

## 2021-06-05 MED ORDER — PANTOPRAZOLE SODIUM 40 MG PO TBEC: 40.0000 mg | DELAYED_RELEASE_TABLET | Freq: Two times a day (BID) | ORAL | 2 refills | Status: DC

## 2021-09-01 ENCOUNTER — Other Ambulatory Visit: Payer: Self-pay | Admitting: *Deleted

## 2021-09-01 ENCOUNTER — Other Ambulatory Visit: Payer: Self-pay | Admitting: Oncology

## 2021-09-01 MED ORDER — LORAZEPAM 0.5 MG PO TABS: 0.5000 mg | ORAL_TABLET | ORAL | 0 refills | Status: DC | PRN

## 2021-09-14 ENCOUNTER — Telehealth: Payer: Self-pay | Admitting: *Deleted

## 2021-09-14 NOTE — Telephone Encounter (Signed)
I spoke with [name] [name]. Orders given.

## 2021-09-14 NOTE — Telephone Encounter (Signed)
Patient having increased pain and hospice asking for order to increase her Roxanol to 10 mg every 2 hours as needed. She has been taking 5 mg and they had family give extra dose this morning and it helped. Please advise

## 2021-09-28 ENCOUNTER — Other Ambulatory Visit: Payer: Self-pay | Admitting: Oncology

## 2021-10-03 ENCOUNTER — Other Ambulatory Visit: Payer: Self-pay | Admitting: *Deleted

## 2021-10-03 MED ORDER — LORAZEPAM 0.5 MG PO TABS: 0.5000 mg | ORAL_TABLET | ORAL | 0 refills | Status: AC | PRN

## 2021-10-03 NOTE — Telephone Encounter (Signed)
Incoming fax from [facility]- pt is out of lorazepam. Requesting RF.

## 2021-10-29 DEATH — deceased

## 2022-04-07 IMAGING — CT CT CERVICAL SPINE W/O CM
3 of 4 series · 9 of 34 positions shown, 11 images · non-contrast
Comparison: CT chest 03/28/2020.

CLINICAL DATA: Atrial fibrillation. Taken off blood thinners due to
operation. Back pain and generalized weakness for couple days.
Tachycardic to 140.

EXAM:
CT HEAD WITHOUT CONTRAST
CT CERVICAL SPINE WITHOUT CONTRAST
TECHNIQUE: Multidetector CT imaging of the head and cervical spine was
performed following the standard protocol without intravenous
contrast. Multiplanar CT image reconstructions of the cervical spine
were also generated.

[Series 6: sagittal bone · sagittal · 0.22mm/px · 5 of 74 slices shown, 6 images]
[im 25/74  bone]
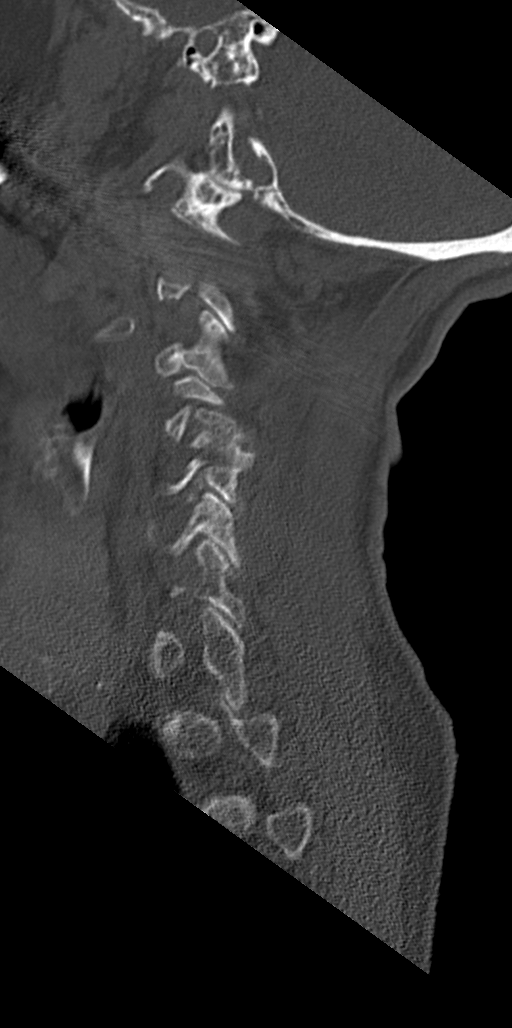
[im 31/74  bone]
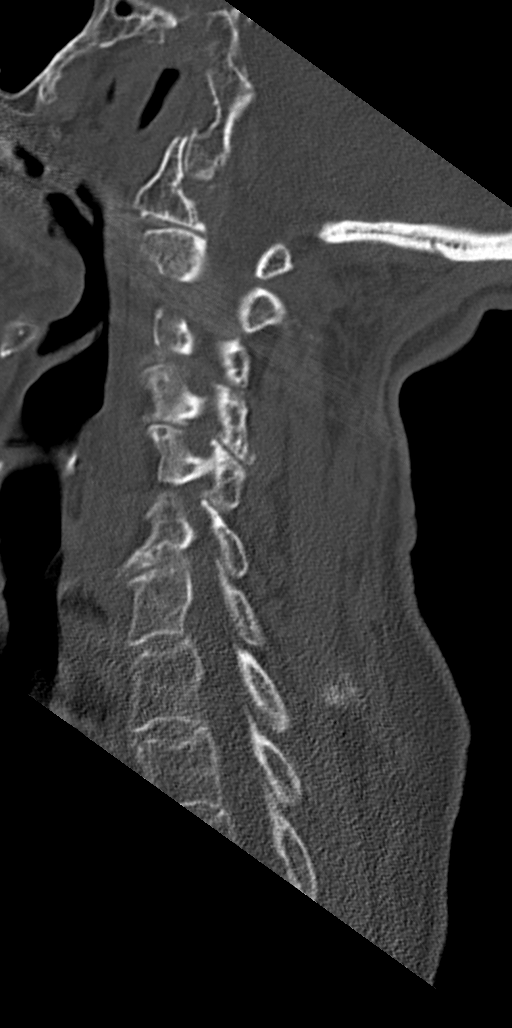
[im 37/74  soft-tissue]
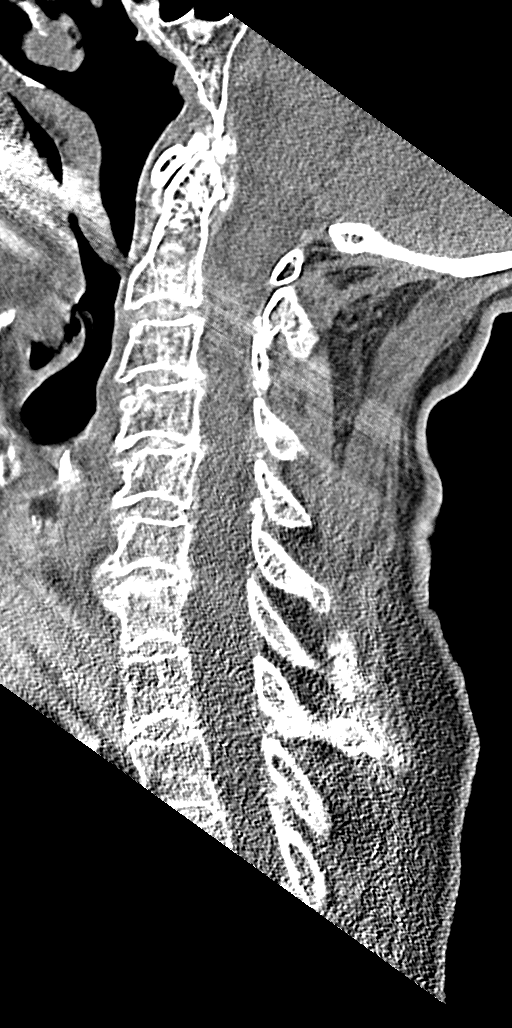
[im 37/74  bone]
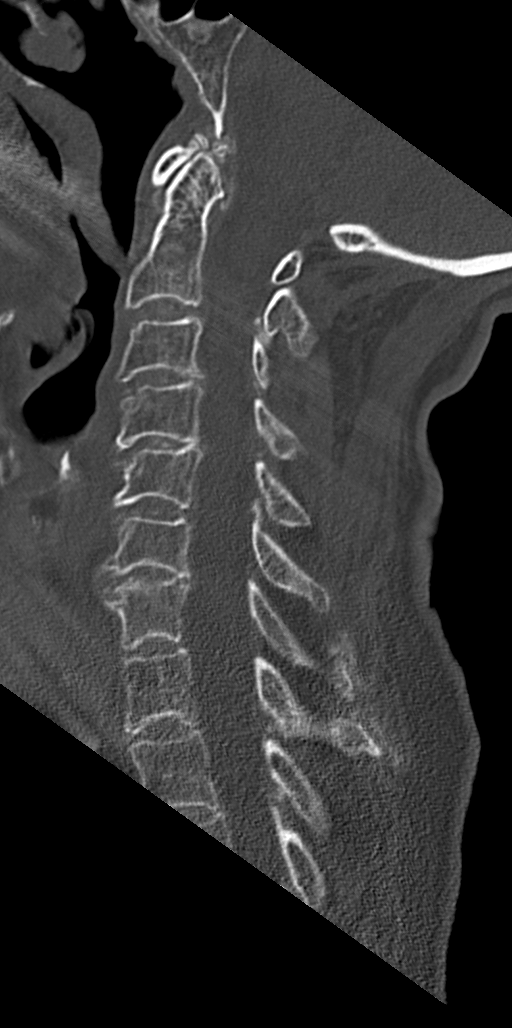
[im 43/74  bone]
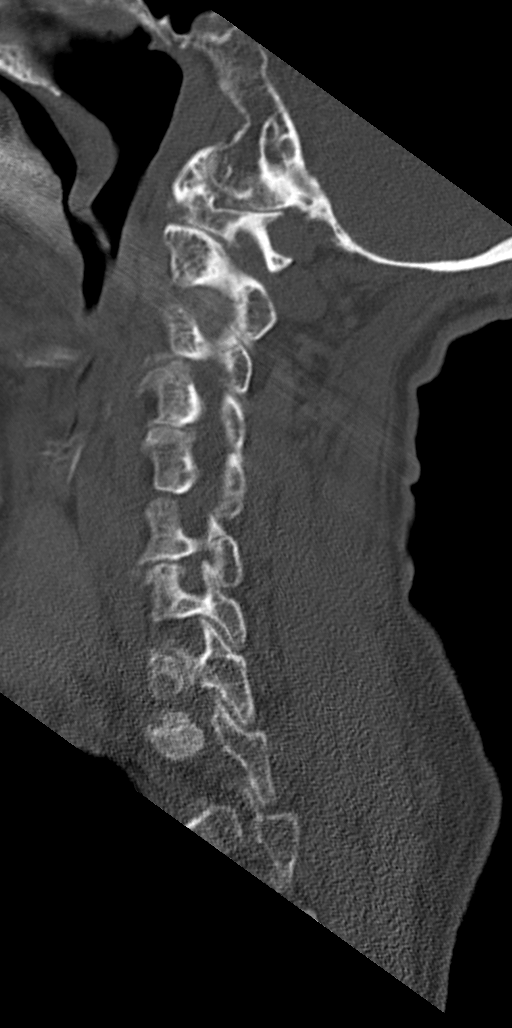
[im 49/74  bone]
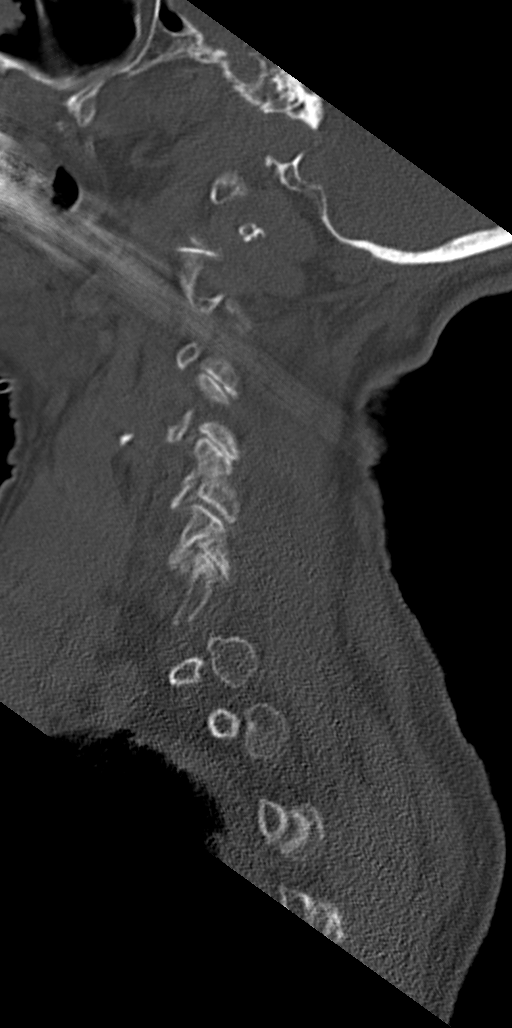

[Series 7: coronal bone · coronal · 0.29mm/px · 3 of 58 slices shown]
[im 16/58  bone]
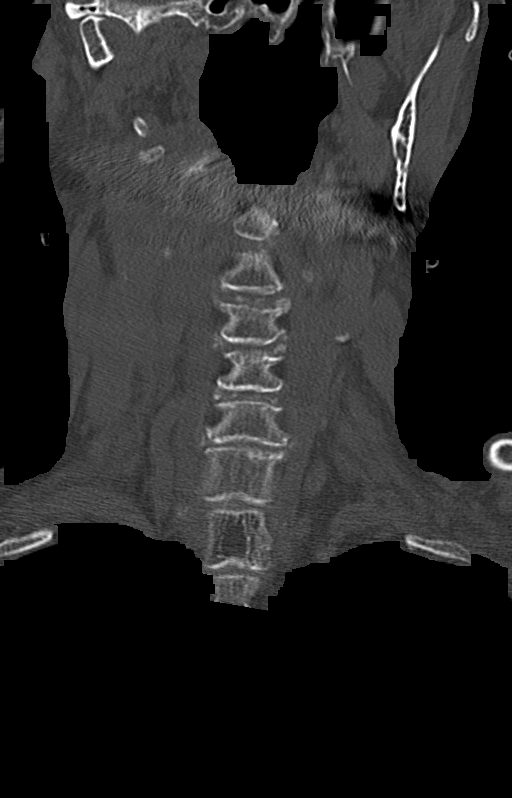
[im 25/58  bone]
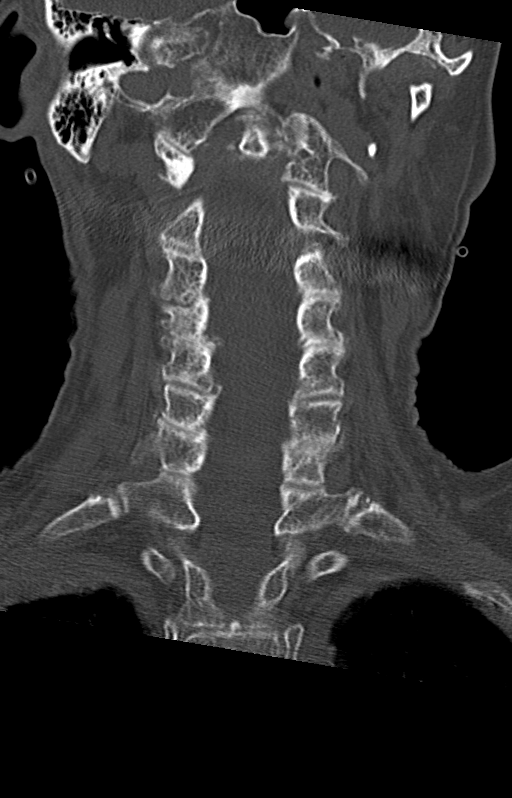
[im 33/58  bone]
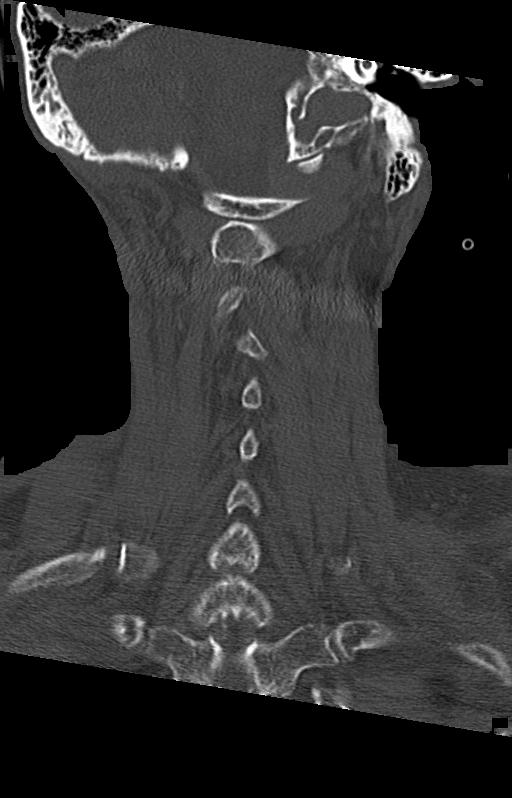

[Series 8: orthogonal bone · axial · 0.22mm/px · z∈[+340,+340]mm · 1 of 115 slices shown, 2 images]
[im 58/115  soft-tissue]
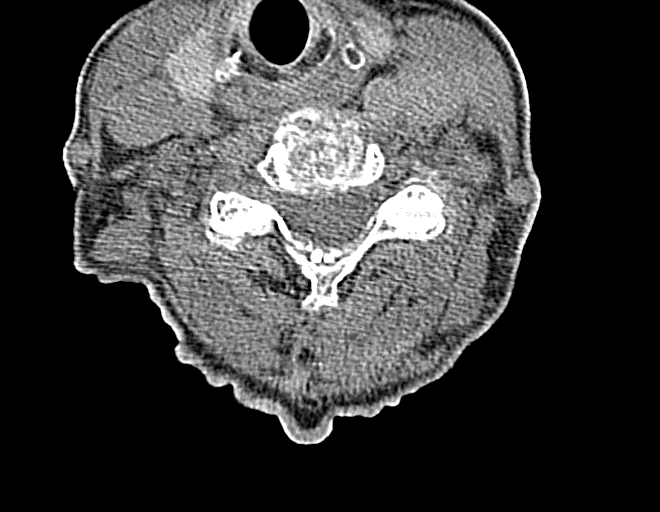
[im 58/115  bone]
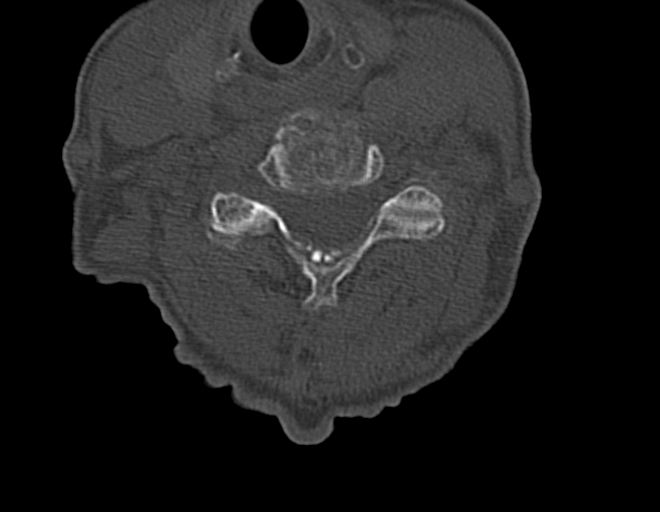

[9 of 34 positions shown; findings below may reference images not displayed]

FINDINGS: CT HEAD FINDINGS

BRAIN:
BRAIN
Prominence of the lateral ventricles may be related to central
predominant atrophy, although a component of normal
pressure/communicating hydrocephalus cannot be excluded.

Patchy and confluent areas of decreased attenuation are noted
throughout the deep and periventricular white matter of the cerebral
hemispheres bilaterally, compatible with chronic microvascular
ischemic disease.

No evidence of large-territorial acute infarction. No parenchymal
hemorrhage. No mass lesion. No extra-axial collection.

No mass effect or midline shift. No hydrocephalus. Basilar cisterns
are patent.

Vascular: No hyperdense vessel. Atherosclerotic calcifications are
present within the cavernous internal carotid arteries.

Skull: No acute fracture or focal lesion.

Sinuses/Orbits: Paranasal sinuses and mastoid air cells are clear.
Bilateral lens replacement. Otherwise orbits are unremarkable.

Other: None.

CT CERVICAL SPINE FINDINGS

Alignment: Normal.

Skull base and vertebrae: Amorphous calcification surrounding the C7
and T1 spinous processes ([DATE]) of unclear etiology but similar to
prior CT chest. No acute fracture. No aggressive appearing focal
osseous lesion or focal pathologic process.

Soft tissues and spinal canal: No prevertebral fluid or swelling. No
visible canal hematoma.

Upper chest: Unremarkable.

Other: A 0.8 cm hypodensity within the left thyroid gland. Not
clinically significant; no follow-up imaging recommended (ref: [HOSPITAL]. [DATE]): 143-50).
IMPRESSION: 1. No acute intracranial abnormality.
2. Prominence of the lateral ventricles may be related to central
predominant atrophy, although a component of normal
pressure/communicating hydrocephalus cannot be excluded.
3. No acute displaced fracture or traumatic listhesis of the
cervical spine.

## 2022-04-07 IMAGING — CT CT HEAD W/O CM
3 series · 15 of 47 positions shown, 18 images · non-contrast
Comparison: CT chest 03/28/2020.

CLINICAL DATA: Atrial fibrillation. Taken off blood thinners due to
operation. Back pain and generalized weakness for couple days.
Tachycardic to 140.

EXAM:
CT HEAD WITHOUT CONTRAST
CT CERVICAL SPINE WITHOUT CONTRAST
TECHNIQUE: Multidetector CT imaging of the head and cervical spine was
performed following the standard protocol without intravenous
contrast. Multiplanar CT image reconstructions of the cervical spine
were also generated.

[Series 3: head wo · axial · 0.40mm/px · z∈[+458,+583]mm · 9 of 31 slices shown, 12 images]
[im 3/31  brain]
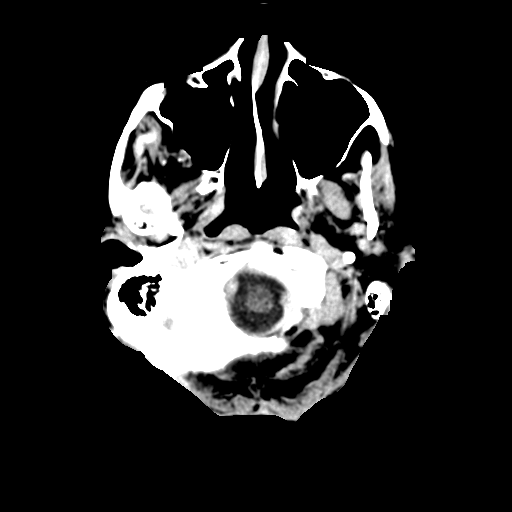
[im 3/31  bone]
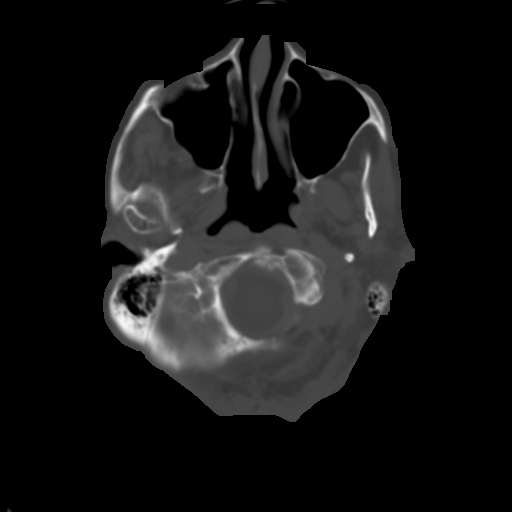
[im 6/31  brain]
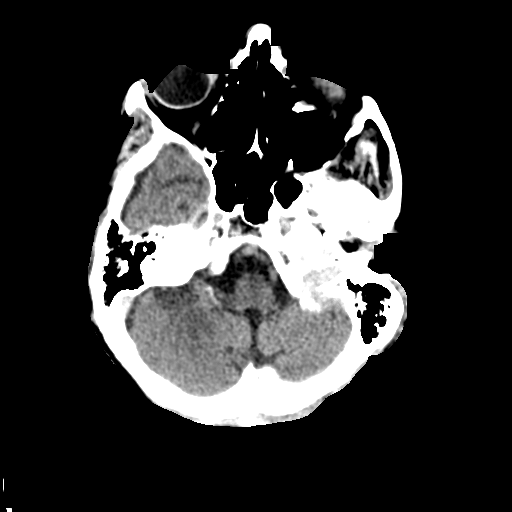
[im 9/31  brain]
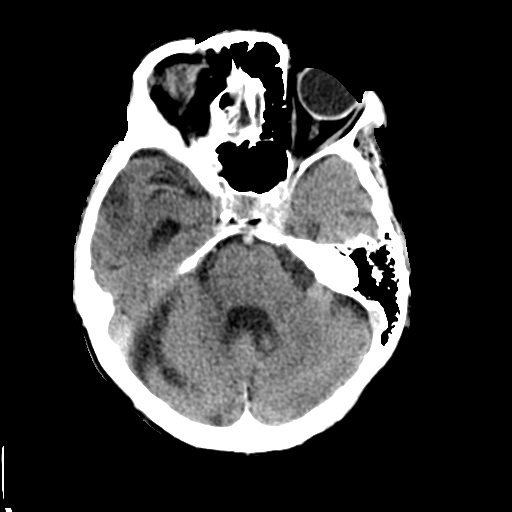
[im 12/31  brain]
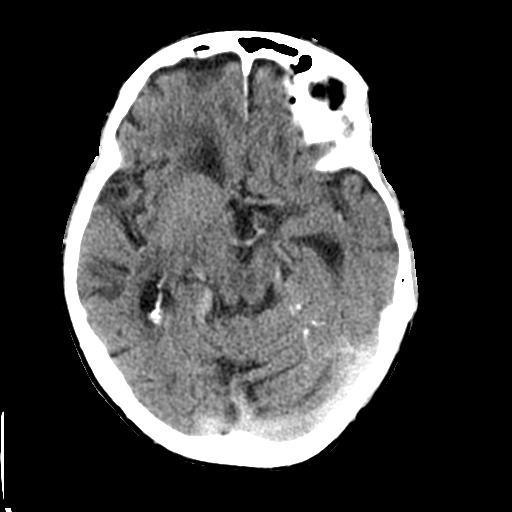
[im 16/31  brain]
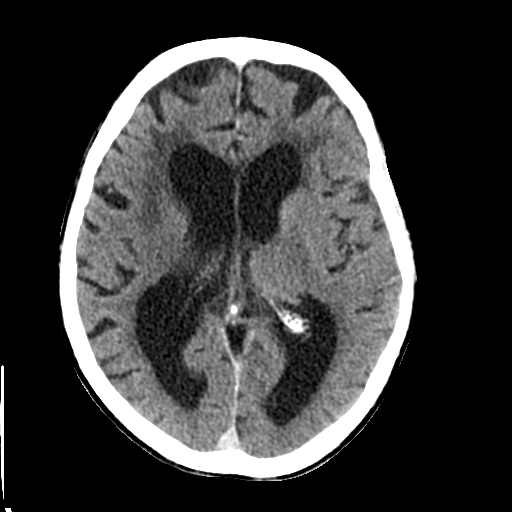
[im 16/31  bone]
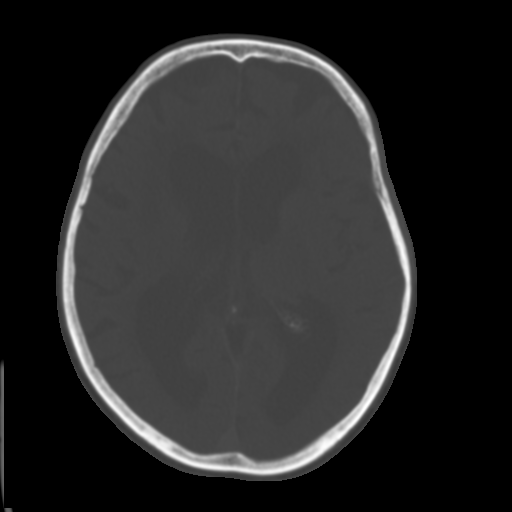
[im 19/31  brain]
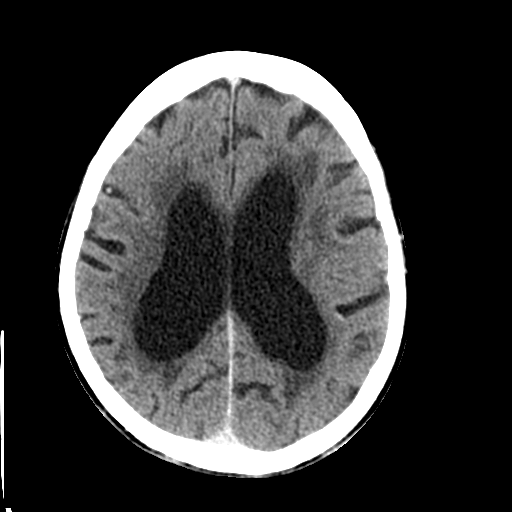
[im 22/31  brain]
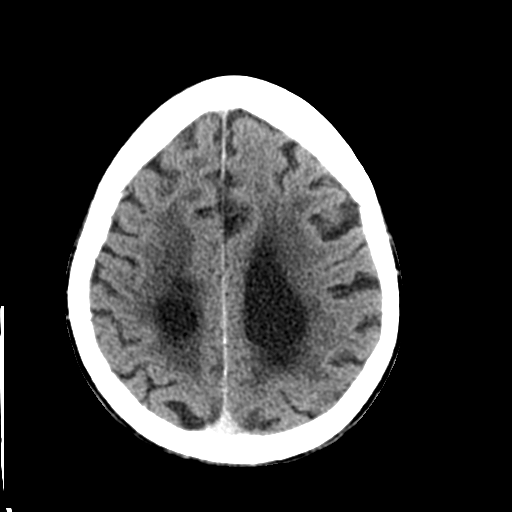
[im 25/31  brain]
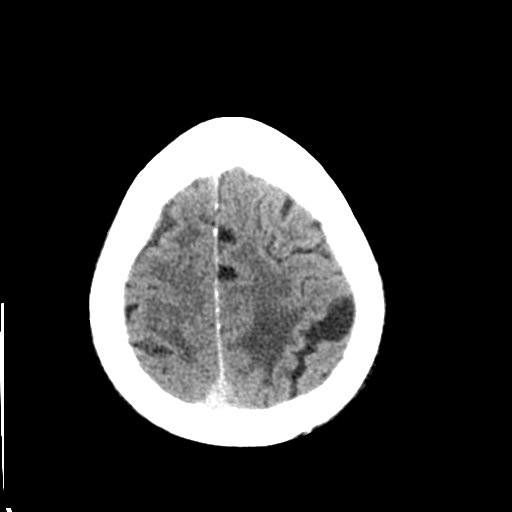
[im 28/31  brain]
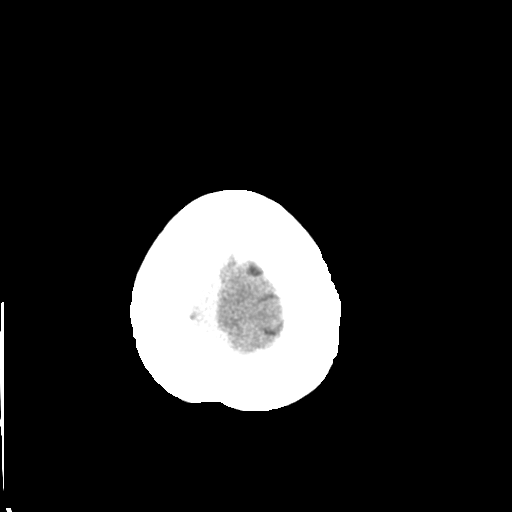
[im 28/31  bone]
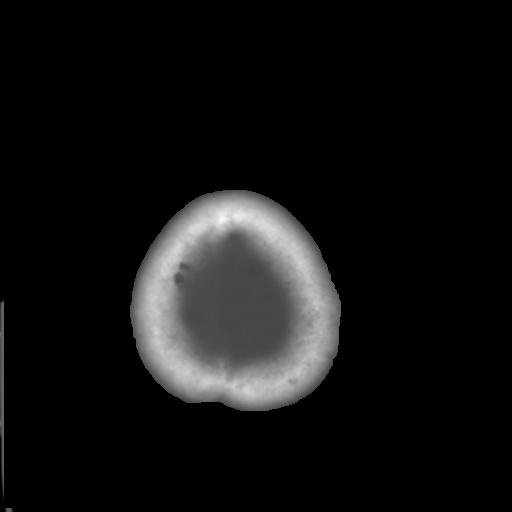

[Series 4: coronal soft tissue · coronal · 0.31mm/px · 3 of 62 slices shown]
[im 21/62  brain]
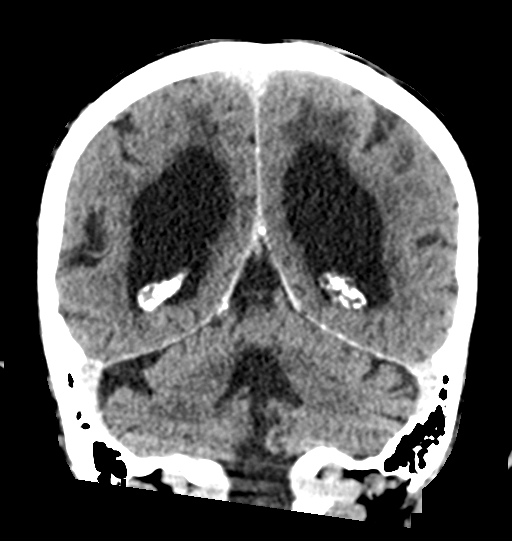
[im 28/62  brain]
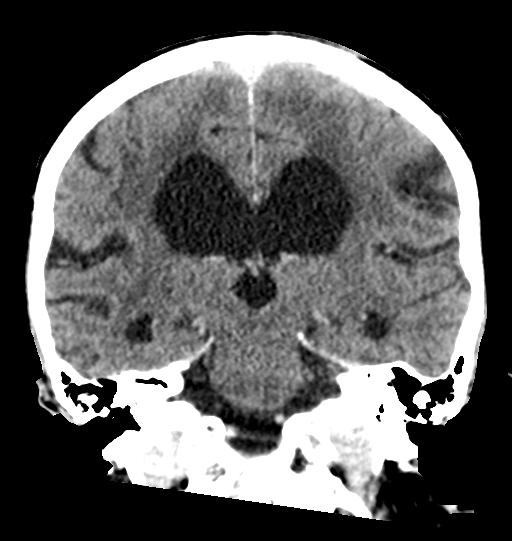
[im 34/62  brain]
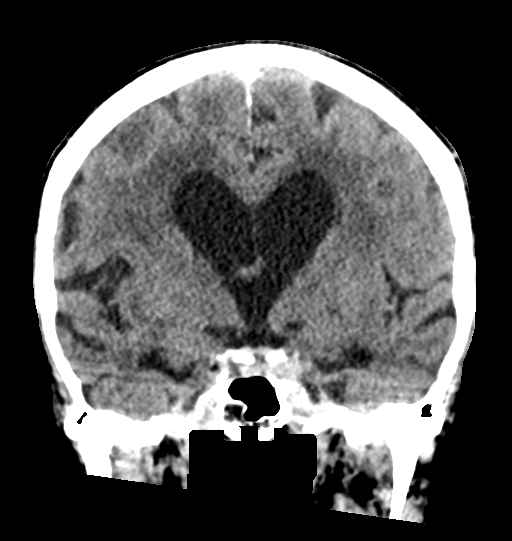

[Series 5: sagittal soft tissue · sagittal · 0.34mm/px · 3 of 53 slices shown]
[im 19/53  brain]
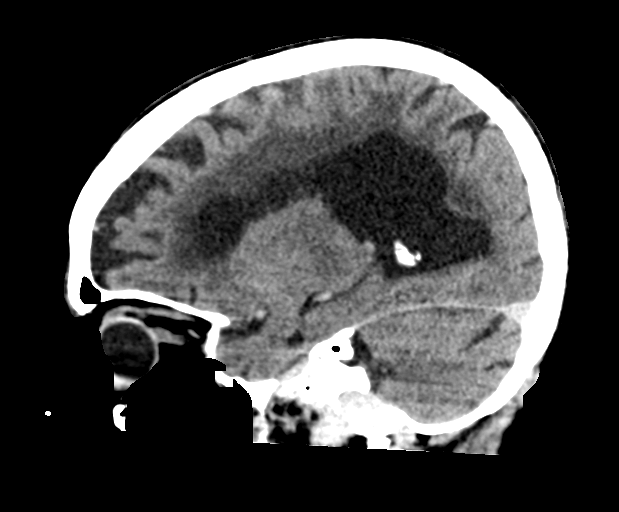
[im 27/53  brain]
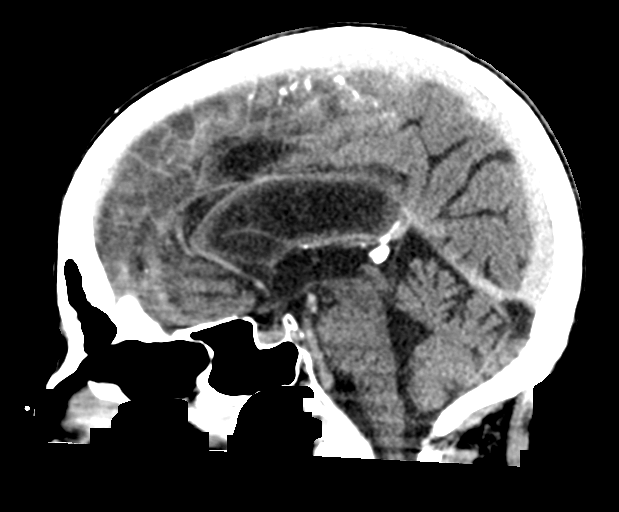
[im 35/53  brain]
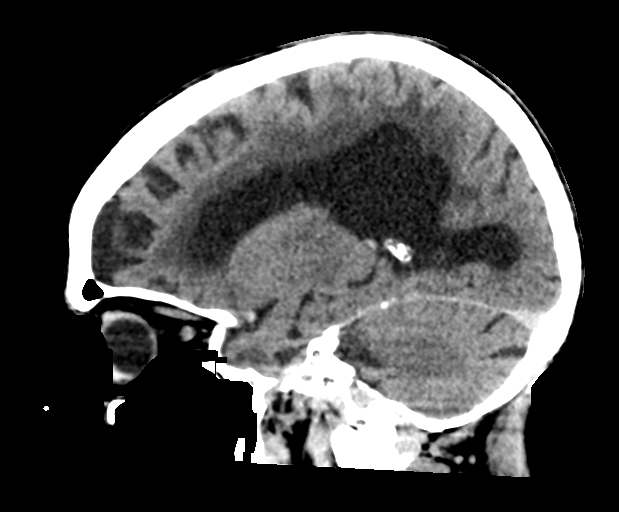

[15 of 47 positions shown; findings below may reference images not displayed]

FINDINGS: CT HEAD FINDINGS

BRAIN:
BRAIN
Prominence of the lateral ventricles may be related to central
predominant atrophy, although a component of normal
pressure/communicating hydrocephalus cannot be excluded.

Patchy and confluent areas of decreased attenuation are noted
throughout the deep and periventricular white matter of the cerebral
hemispheres bilaterally, compatible with chronic microvascular
ischemic disease.

No evidence of large-territorial acute infarction. No parenchymal
hemorrhage. No mass lesion. No extra-axial collection.

No mass effect or midline shift. No hydrocephalus. Basilar cisterns
are patent.

Vascular: No hyperdense vessel. Atherosclerotic calcifications are
present within the cavernous internal carotid arteries.

Skull: No acute fracture or focal lesion.

Sinuses/Orbits: Paranasal sinuses and mastoid air cells are clear.
Bilateral lens replacement. Otherwise orbits are unremarkable.

Other: None.

CT CERVICAL SPINE FINDINGS

Alignment: Normal.

Skull base and vertebrae: Amorphous calcification surrounding the C7
and T1 spinous processes ([DATE]) of unclear etiology but similar to
prior CT chest. No acute fracture. No aggressive appearing focal
osseous lesion or focal pathologic process.

Soft tissues and spinal canal: No prevertebral fluid or swelling. No
visible canal hematoma.

Upper chest: Unremarkable.

Other: A 0.8 cm hypodensity within the left thyroid gland. Not
clinically significant; no follow-up imaging recommended (ref: [HOSPITAL]. [DATE]): 143-50).
IMPRESSION: 1. No acute intracranial abnormality.
2. Prominence of the lateral ventricles may be related to central
predominant atrophy, although a component of normal
pressure/communicating hydrocephalus cannot be excluded.
3. No acute displaced fracture or traumatic listhesis of the
cervical spine.
